# Patient Record
Sex: Female | Born: 1955 | Race: White | Hispanic: No | Marital: Married | Smoking: Current some day smoker
Health system: Southern US, Community
[De-identification: ages and names within clinical notes are randomized; demographics above are authoritative.]

## PROBLEM LIST (undated history)

## (undated) DIAGNOSIS — T50902A Poisoning by unspecified drugs, medicaments and biological substances, intentional self-harm, initial encounter: Secondary | ICD-10-CM

## (undated) DIAGNOSIS — I255 Ischemic cardiomyopathy: Secondary | ICD-10-CM

## (undated) DIAGNOSIS — Z9289 Personal history of other medical treatment: Secondary | ICD-10-CM

## (undated) DIAGNOSIS — L409 Psoriasis, unspecified: Secondary | ICD-10-CM

## (undated) DIAGNOSIS — K219 Gastro-esophageal reflux disease without esophagitis: Secondary | ICD-10-CM

## (undated) DIAGNOSIS — E039 Hypothyroidism, unspecified: Secondary | ICD-10-CM

## (undated) DIAGNOSIS — I10 Essential (primary) hypertension: Secondary | ICD-10-CM

## (undated) DIAGNOSIS — I4819 Other persistent atrial fibrillation: Secondary | ICD-10-CM

## (undated) DIAGNOSIS — D649 Anemia, unspecified: Secondary | ICD-10-CM

## (undated) DIAGNOSIS — Z66 Do not resuscitate: Secondary | ICD-10-CM

## (undated) DIAGNOSIS — F32A Depression, unspecified: Secondary | ICD-10-CM

## (undated) DIAGNOSIS — F1010 Alcohol abuse, uncomplicated: Secondary | ICD-10-CM

## (undated) DIAGNOSIS — K859 Acute pancreatitis without necrosis or infection, unspecified: Secondary | ICD-10-CM

## (undated) DIAGNOSIS — I2510 Atherosclerotic heart disease of native coronary artery without angina pectoris: Secondary | ICD-10-CM

## (undated) DIAGNOSIS — I509 Heart failure, unspecified: Secondary | ICD-10-CM

## (undated) DIAGNOSIS — I639 Cerebral infarction, unspecified: Secondary | ICD-10-CM

## (undated) DIAGNOSIS — F329 Major depressive disorder, single episode, unspecified: Secondary | ICD-10-CM

## (undated) DIAGNOSIS — E785 Hyperlipidemia, unspecified: Secondary | ICD-10-CM

## (undated) DIAGNOSIS — K5790 Diverticulosis of intestine, part unspecified, without perforation or abscess without bleeding: Secondary | ICD-10-CM

## (undated) DIAGNOSIS — N6310 Unspecified lump in the right breast, unspecified quadrant: Principal | ICD-10-CM

## (undated) DIAGNOSIS — F419 Anxiety disorder, unspecified: Secondary | ICD-10-CM

## (undated) HISTORY — DX: Unspecified lump in the right breast, unspecified quadrant: N63.10

## (undated) HISTORY — DX: Poisoning by unspecified drugs, medicaments and biological substances, intentional self-harm, initial encounter: T50.902A

## (undated) HISTORY — DX: Do not resuscitate: Z66

## (undated) HISTORY — DX: Psoriasis, unspecified: L40.9

## (undated) HISTORY — PX: COLOSTOMY REVERSAL: SHX5782

## (undated) HISTORY — DX: Other persistent atrial fibrillation: I48.19

## (undated) HISTORY — PX: ILEOSTOMY: SHX1783

## (undated) HISTORY — PX: ILEOSTOMY CLOSURE: SHX1784

---
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
## 1979-01-22 DIAGNOSIS — F1010 Alcohol abuse, uncomplicated: Secondary | ICD-10-CM

## 1979-01-22 HISTORY — DX: Alcohol abuse, uncomplicated: F10.10

## 2002-03-11 ENCOUNTER — Encounter: Payer: Self-pay | Admitting: Internal Medicine

## 2002-03-11 ENCOUNTER — Encounter: Admission: RE | Admit: 2002-03-11 | Discharge: 2002-03-11 | Payer: Self-pay | Admitting: Internal Medicine

## 2002-03-31 ENCOUNTER — Encounter: Admission: RE | Admit: 2002-03-31 | Discharge: 2002-03-31 | Payer: Self-pay | Admitting: Internal Medicine

## 2002-03-31 ENCOUNTER — Encounter: Payer: Self-pay | Admitting: Internal Medicine

## 2002-12-30 ENCOUNTER — Encounter (INDEPENDENT_AMBULATORY_CARE_PROVIDER_SITE_OTHER): Payer: Self-pay | Admitting: *Deleted

## 2002-12-30 ENCOUNTER — Ambulatory Visit (HOSPITAL_COMMUNITY): Admission: RE | Admit: 2002-12-30 | Discharge: 2002-12-30 | Payer: Self-pay | Admitting: Gastroenterology

## 2003-08-29 ENCOUNTER — Inpatient Hospital Stay (HOSPITAL_COMMUNITY): Admission: EM | Admit: 2003-08-29 | Discharge: 2003-09-02 | Payer: Self-pay | Admitting: *Deleted

## 2004-05-22 ENCOUNTER — Inpatient Hospital Stay (HOSPITAL_COMMUNITY): Admission: EM | Admit: 2004-05-22 | Discharge: 2004-05-26 | Payer: Self-pay | Admitting: Emergency Medicine

## 2005-05-25 ENCOUNTER — Ambulatory Visit: Payer: Self-pay | Admitting: Internal Medicine

## 2005-05-25 ENCOUNTER — Inpatient Hospital Stay (HOSPITAL_COMMUNITY): Admission: EM | Admit: 2005-05-25 | Discharge: 2005-06-01 | Payer: Self-pay | Admitting: Emergency Medicine

## 2005-05-26 ENCOUNTER — Encounter (INDEPENDENT_AMBULATORY_CARE_PROVIDER_SITE_OTHER): Payer: Self-pay | Admitting: *Deleted

## 2005-06-09 ENCOUNTER — Emergency Department (HOSPITAL_COMMUNITY): Admission: EM | Admit: 2005-06-09 | Discharge: 2005-06-10 | Payer: Self-pay | Admitting: Emergency Medicine

## 2007-03-23 ENCOUNTER — Ambulatory Visit: Payer: Self-pay | Admitting: Internal Medicine

## 2007-03-23 ENCOUNTER — Inpatient Hospital Stay (HOSPITAL_COMMUNITY): Admission: EM | Admit: 2007-03-23 | Discharge: 2007-03-29 | Payer: Self-pay | Admitting: Emergency Medicine

## 2007-03-24 ENCOUNTER — Ambulatory Visit: Payer: Self-pay | Admitting: Vascular Surgery

## 2007-03-24 ENCOUNTER — Encounter: Payer: Self-pay | Admitting: Family Medicine

## 2007-03-27 ENCOUNTER — Encounter (INDEPENDENT_AMBULATORY_CARE_PROVIDER_SITE_OTHER): Payer: Self-pay | Admitting: *Deleted

## 2007-04-03 ENCOUNTER — Ambulatory Visit: Payer: Self-pay | Admitting: *Deleted

## 2007-04-03 ENCOUNTER — Ambulatory Visit: Payer: Self-pay | Admitting: Family Medicine

## 2007-05-06 ENCOUNTER — Ambulatory Visit: Payer: Self-pay | Admitting: Internal Medicine

## 2007-05-06 ENCOUNTER — Encounter: Payer: Self-pay | Admitting: Family Medicine

## 2007-05-06 LAB — CONVERTED CEMR LAB
ALT: 23 units/L (ref 0–35)
AST: 24 units/L (ref 0–37)
Alcohol, Ethyl (B): <5 mg/dL (ref 0–10)
Amphetamine Screen, Ur: NEGATIVE
Barbiturate Quant, Ur: NEGATIVE
Basophils Relative: 1 % (ref 0–1)
Benzodiazepines.: NEGATIVE
Chloride: 103 meq/L (ref 96–112)
Cocaine Metabolites: NEGATIVE
Creatinine,U: 480.3 mg/dL
Eosinophils Absolute: 0.3 10*3/uL (ref 0.0–0.7)
Eosinophils Relative: 5 % (ref 0–5)
Hemoglobin: 13.8 g/dL (ref 12.0–15.0)
Lymphocytes Relative: 25 % (ref 12–46)
MCV: 92.6 fL (ref 78.0–100.0)
Neutro Abs: 4 10*3/uL (ref 1.7–7.7)
Phencyclidine (PCP): NEGATIVE
Propoxyphene: NEGATIVE
RBC: 4.61 M/uL (ref 3.87–5.11)
RDW: 13.5 % (ref 11.5–15.5)
Sodium: 141 meq/L (ref 135–145)
TSH: 3.954 microintl units/mL (ref 0.350–5.50)
Total Protein: 7.9 g/dL (ref 6.0–8.3)
VLDL: 26 mg/dL (ref 0–40)
WBC: 6.6 10*3/uL (ref 4.0–10.5)

## 2007-05-15 ENCOUNTER — Ambulatory Visit: Payer: Self-pay | Admitting: Internal Medicine

## 2007-07-01 ENCOUNTER — Encounter: Admission: RE | Admit: 2007-07-01 | Discharge: 2007-07-01 | Payer: Self-pay | Admitting: Surgery

## 2007-07-31 ENCOUNTER — Inpatient Hospital Stay (HOSPITAL_COMMUNITY): Admission: RE | Admit: 2007-07-31 | Discharge: 2007-08-07 | Payer: Self-pay | Admitting: Surgery

## 2007-07-31 ENCOUNTER — Encounter (INDEPENDENT_AMBULATORY_CARE_PROVIDER_SITE_OTHER): Payer: Self-pay | Admitting: Surgery

## 2007-08-13 ENCOUNTER — Inpatient Hospital Stay (HOSPITAL_COMMUNITY): Admission: AD | Admit: 2007-08-13 | Discharge: 2007-08-14 | Payer: Self-pay | Admitting: Surgery

## 2007-09-24 ENCOUNTER — Ambulatory Visit (HOSPITAL_COMMUNITY): Admission: RE | Admit: 2007-09-24 | Discharge: 2007-09-24 | Payer: Self-pay | Admitting: Surgery

## 2007-11-12 ENCOUNTER — Encounter (INDEPENDENT_AMBULATORY_CARE_PROVIDER_SITE_OTHER): Payer: Self-pay | Admitting: Surgery

## 2007-11-12 ENCOUNTER — Inpatient Hospital Stay (HOSPITAL_COMMUNITY): Admission: RE | Admit: 2007-11-12 | Discharge: 2007-11-17 | Payer: Self-pay | Admitting: Surgery

## 2008-10-02 ENCOUNTER — Emergency Department (HOSPITAL_COMMUNITY): Admission: EM | Admit: 2008-10-02 | Discharge: 2008-10-03 | Payer: Self-pay | Admitting: Emergency Medicine

## 2008-10-21 ENCOUNTER — Ambulatory Visit: Payer: Self-pay | Admitting: Internal Medicine

## 2008-10-21 ENCOUNTER — Encounter (INDEPENDENT_AMBULATORY_CARE_PROVIDER_SITE_OTHER): Payer: Self-pay | Admitting: Family Medicine

## 2008-10-21 LAB — CONVERTED CEMR LAB
TSH: 3.742 microintl units/mL (ref 0.350–4.500)
Triglycerides: 104 mg/dL (ref <150)

## 2008-10-31 ENCOUNTER — Ambulatory Visit: Payer: Self-pay | Admitting: Internal Medicine

## 2009-01-21 DIAGNOSIS — I639 Cerebral infarction, unspecified: Secondary | ICD-10-CM

## 2009-01-21 HISTORY — DX: Cerebral infarction, unspecified: I63.9

## 2010-02-12 ENCOUNTER — Encounter: Payer: Self-pay | Admitting: Surgery

## 2010-04-27 LAB — URINALYSIS, ROUTINE W REFLEX MICROSCOPIC
Bilirubin Urine: NEGATIVE
Glucose, UA: NEGATIVE mg/dL
Ketones, ur: NEGATIVE mg/dL
pH: 5 (ref 5.0–8.0)

## 2010-04-27 LAB — DIFFERENTIAL
Eosinophils Relative: 3 % (ref 0–5)
Lymphs Abs: 2.8 10*3/uL (ref 0.7–4.0)
Neutro Abs: 3.6 10*3/uL (ref 1.7–7.7)
Neutrophils Relative %: 51 % (ref 43–77)

## 2010-04-27 LAB — CBC
Hemoglobin: 14.5 g/dL (ref 12.0–15.0)
MCHC: 34.3 g/dL (ref 30.0–36.0)
MCV: 93 fL (ref 78.0–100.0)
Platelets: 317 10*3/uL (ref 150–400)
RBC: 4.56 MIL/uL (ref 3.87–5.11)
WBC: 7.1 10*3/uL (ref 4.0–10.5)

## 2010-04-27 LAB — URINE CULTURE: Colony Count: 6000

## 2010-04-27 LAB — COMPREHENSIVE METABOLIC PANEL
AST: 28 U/L (ref 0–37)
CO2: 22 mEq/L (ref 19–32)
Calcium: 8.9 mg/dL (ref 8.4–10.5)
Chloride: 102 mEq/L (ref 96–112)
GFR calc non Af Amer: >60 mL/min (ref >60)
Glucose, Bld: 84 mg/dL (ref 70–99)
Total Bilirubin: 0.3 mg/dL (ref 0.3–1.2)
Total Protein: 8.2 g/dL (ref 6.0–8.3)

## 2010-04-27 LAB — TROPONIN I: Troponin I: 0.03 ng/mL (ref 0.00–0.06)

## 2010-04-27 LAB — URINE MICROSCOPIC-ADD ON

## 2010-04-27 LAB — GLUCOSE, CAPILLARY

## 2010-04-27 LAB — RAPID URINE DRUG SCREEN, HOSP PERFORMED: Tetrahydrocannabinol: NOT DETECTED

## 2010-04-27 LAB — ETHANOL: Alcohol, Ethyl (B): 305 mg/dL — ABNORMAL HIGH (ref 0–10)

## 2010-04-27 LAB — TRICYCLICS SCREEN, URINE: TCA Scrn: NOT DETECTED

## 2010-04-27 LAB — PROTIME-INR
INR: 0.9 (ref 0.00–1.49)
Prothrombin Time: 12.2 seconds (ref 11.6–15.2)

## 2010-04-27 LAB — CK TOTAL AND CKMB (NOT AT ARMC): CK, MB: 4.1 ng/mL — ABNORMAL HIGH (ref 0.3–4.0)

## 2010-04-27 LAB — POCT CARDIAC MARKERS: Troponin i, poc: <0.05 ng/mL (ref 0.00–0.09)

## 2010-06-05 NOTE — Discharge Summary (Signed)
[name], [name] NO.:  [id number]   MEDICAL RECORD NO.:  [id number]          PATIENT TYPE:  INP   LOCATION:  5118                         FACILITY:  [facility]   PHYSICIAN:  Zenaida Deed. Mayford Knife, M.D.DATE OF BIRTH:  09-Jun-1955   DATE OF ADMISSION:  03/23/2007  DATE OF DISCHARGE:  03/29/2007                               DISCHARGE SUMMARY   PRIMARY CARE PHYSICIAN:  The patient receives her primary care at [facility].   CONSULTATIONS:  Estanislado Pandy, M.D. of neurology.   REASON FOR ADMISSION:  The patient was admitted with a history of  nausea, vomiting, and abdominal pain x1 days.   DISCHARGE DIAGNOSES:  1. Acute pancreatitis, likely etiology is alcohol.  2. Right middle cerebral artery cerebrovascular accident.  3. Alcohol abuse.  4. History of perforated sigmoid diverticulitis with colectomy in May      of 2007.  5. Multiple episodes of pancreatitis.  6. Psoriasis.  7. Hyperlipidemia.   DISCHARGE MEDICATIONS:  1. Triamcinolone acetamide 0.1% ointment to affected areas b.i.d.  2. Aspirin 81 mg p.o. daily.  3. Simvastatin 40 mg p.o. daily.  4. Pantoprazole 40 mg p.o. daily.  5. Morphine sulfate IR 15 mg one tablet p.o. q.6 hours p.r.n. pain      dispense #4.   DISCHARGE LABORATORY DATA:  Sodium 139, potassium 4.0, BUN 4, creatinine  0.79, hemoglobin 14.1, platelets 347, hemoglobin A1C 5.7, triglycerides  64, HDL 59, LDL 184.   HOSPITAL COURSE:  Problem 1.  Pancreatitis.  The patient presented with  one-day history of nausea, vomiting, and abdominal pain.  The patient  had multiple episodes of pancreatitis in the past.  Upon evaluation in  the emergency department, she was found to have decreased bowel sounds,  right upper quadrant tenderness, and on laboratory evaluation had a  lipase of 624.  The patient had a CT scan of the abdomen and pelvis  which showed acute pancreatitis, uncinate process, and proximal body.  The patient was made NPO and  started on morphine for pain control.  The  patient had a right upper quadrant ultrasound done which did not show  any evidence of gallstones and therefore the likely cause of her  pancreatitis is once again alcohol as it has been in the past.  Throughout the course of her hospital stay, the patient's diet was  advanced and she was able to tolerate it without difficulty.  She was  provided with immediate release morphine tablets on discharge for any  residual pain.  The patient was also counseled to stop drinking alcohol  as this is likely the cause of her multiple episodes of pancreatitis.   Problem 2.  Right middle cerebral artery cerebrovascular accident.  Upon  presentation in questioning the patient's evaluation in the emergency  department, she reported an episode in February of 2009 when she recalls  acute onset of left facial droop and left-sided weakness.  The patient  did not seek medical care for this issue.  These symptoms and physical  examination findings were noted upon assessment.  She had a CT scan  of  the head performed which showed possible subacute stroke in the right  frontal parietal region.  The patient subsequently also had an MRI and  MRA of the brain done which showed subacute and chronic multifocal right  MCA territory ischemia without associated hemorrhage or mass effect.  It  also showed complete occlusion of the right middle cerebral artery with  otherwise normal cranial or cerebral vasculature.  The patient was  followed by physical therapy, occupational therapy, and speech therapy  while in the hospital.  She is provided with outpatient or home health  speech therapy, home health occupational therapy, and home health  physical therapy for continued rehabilitation.  Following a TTE and TEE  which did not show any cardioembolic source for CVA, neurology was  consulted to see if there was any possible intervention for her occluded  right middle cerebral artery  which there was none and they recommended  continuing aspirin daily as well as controlling the patient's risk  factors.  The patient was counseled to stop smoking as it is a very  large risk factor for CVA.  She will need to have these issues followed  up as an outpatient.   Problem 3.  History of perforated diverticulitis with persistent  colostomy.  While the patient was hospitalized, [facility]  was contacted and it was determined that the patient has an outstanding  balance with them and that is why she is unable to have an ostomy  takedown at this time.  This information was relayed to the patient.   Problem 4.  Hyperlipidemia.  Given the patient's CVA and other risk  factors, she was started on Simvastatin 40 mg p.o. daily.   CONDITION ON DISCHARGE:  Stable.   DISPOSITION:  The patient is discharged to home with home health  physical therapy, home health occupational therapy, and home health  speech therapy.   FOLLOWUP:  The patient is to follow up at [facility] on April 03, 2007.   FOLLOW-UP ISSUES:  The patient should undergo a hypercoagulable workup  given her relatively young age and no specific etiology of her stroke  being determined.   The patient was recently started on statin and will need her  transaminases monitored.      Lauro Franklin, MD  Electronically Signed      Zenaida Deed. Mayford Knife, M.D.  Electronically Signed    TCB/MEDQ  D:  04/01/2007  T:  04/02/2007  Job:  [id number]

## 2010-06-05 NOTE — H&P (Signed)
NAMEVERDELL, KINCANNON NO.:  [id number]   MEDICAL RECORD NO.:  [id number]          PATIENT TYPE:  INP   LOCATION:  1536                         [facility]ACILITY:  [facility]   PHYSICIAN:  Adolph Pollack, M.D.DATE OF BIRTH:  [birth year]   DATE OF ADMISSION:  08/13/2007  DATE OF DISCHARGE:                              HISTORY & PHYSICAL   REASON FOR ADMISSION:  Dehydration and failure to thrive.   HISTORY OF PRESENT ILLNESS:  This 55 year old female is postoperative  day 13 from a colostomy closure and diverting loop ileostomy who states  that she has been having increasing problems with some crampy upper  abdominal pain and nausea, vomiting and weakness.  She is having  difficulty holding anything down.  She has ileostomy output as well.  She says she is having decreasing urinary output.  She has no fever or  chills.   PAST MEDICAL HISTORY:  1. Recurring episodes of pancreatitis felt to be secondary to alcohol      abuse.  2. Cerebrovascular accident.  3. Sigmoid diverticulitis with perforation.  4. Psoriasis.  5. Hyperlipidemia.  6. Alcohol abuse.   PREVIOUS ABDOMINAL SURGERIES:  1. Sigmoid colectomy with colostomy in May 2007.  2. Colostomy closure with loop ileostomy as above.   ALLERGIES:  She has no known drug allergies.   MEDICATIONS:  Crestor, hydrochlorothiazide, Protonix and aspirin.   SOCIAL HISTORY:  She lives with her husband, smokes a pack of cigarettes  a day.  Has been known to drink beer and vodka 2-3 days a week but has  tried to quit.   PHYSICAL EXAMINATION:  GENERAL APPEARANCE:  A well-developed, well-  nourished female, a little shaky at times, but she is in no acute  distress.  VITAL SIGNS:  Temperature is 98.2, blood pressure is 136/80, pulse 83,  O2 saturations 98% on room air.  EYES:  Extraocular motion intact.  I see no icterus.  RESPIRATORY:  Breath sounds equal and clear, respirations unlabored.  CARDIOVASCULAR:  Regular rate,  regular rhythm.  I hear no murmur.  ABDOMEN:  Soft, nondistended, nontender with active bowel sounds.  Incisions are clean, dry and intact.  There is a loop ileostomy with a  bar in the right lower quadrant that is pink with some fluid output.  MUSCULOSKELETAL:  No calf swelling or tenderness.   LABORATORY DATA:  From August 11, 2007, demonstrates a white cell count of  16,000, hemoglobin 14.7 (up from 12.6 five days ago).  Electrolytes  notable for BUN of 33, creatinine of 1.4 which is up from her previous  admission as well.   IMPRESSION:  1. Nausea and vomiting.  2. Significant dehydration.  3. Fairly to thrive.  The etiology of nausea and vomiting is unclear      to me as she has good bowel sounds.  No distention.  She is having      good ileostomy output.  She does not relate it to taking any      medicine.   PLAN:  Will admit and hydrate aggressively.  We will check labs  tomorrow.  If she continues to have elevation of white count and feeling  poorly, she may need an abdominal CT.      Adolph Pollack, M.D.  Electronically Signed     TJR/MEDQ  D:  08/13/2007  T:  08/13/2007  Job:  [id number]

## 2010-06-05 NOTE — Consult Note (Signed)
[name], [name] NO.:  [id number]   MEDICAL RECORD NO.:  [id number]          PATIENT TYPE:  INP   LOCATION:  5118                         FACILITY:  [facility]   PHYSICIAN:  Bevelyn Buckles. Champey, M.D.DATE OF BIRTH:  07/14/1955   DATE OF CONSULTATION:  03/27/2007  DATE OF DISCHARGE:                                 CONSULTATION   REFERRING PHYSICIAN:  Santiago Bumpers. Leveda Anna, M.D.   REASON FOR CONSULTATION:  Stroke.   HISTORY OF PRESENT ILLNESS:  [name] is a 55 year old Caucasian  female with multiple medical problems initially presented on March 23, 2007, with nausea, vomiting, abdominal pain diagnosed pancreatitis.  Patient apparently had stroke within the last 1 to 2 months starting  with sudden onset of  left facial drop and left sided weakness and  numbness.  She did not receive medical treatment at this time and  symptoms still persist.  We were consulted to evaluate stroke.  Patient  states that her symptoms started suddenly and have persisted since.  She  also complained of right sided headache with the onset and slurred  speech, which is still present.  She denies any vision changes,  dizziness, vertigo, swallowing problems, chewing problems or loss of  consciousness.   PAST MEDICAL HISTORY:  1. Positive for diverticulitis.  2. Pancreatitis.  3. Psoriasis.  4. Stroke.   CURRENT MEDICATIONS:  Multiple vitamin, nicotine, aspirin, Zocor,  morphine.   ALLERGIES:  1. PENICILLIN.  2. CODEINE.  3. LATEX.   SOCIAL HISTORY:  Patient lives with her husband, is unemployed.  Smokes  1 pack of cigarettes per day.  Drinks alcohol, beer and vodka a few days  per week.   FAMILY HISTORY:  Positive for colon cancer, lung cancer.   REVIEW OF SYSTEMS:  Positive as per HPI.  Review of systems negative as  per HPI in greater than 8 other organ systems.   PHYSICAL EXAMINATION:  VITALS:  Temperature is 98.4.  Pulse is 100.  Respirations 20.  Blood pressure is 112/74.   O2 sat is 91% to 94%.  HEENT:  Normocephalic, atraumatic.  Extraocular muscles are intact.  Visual fields are intact.  Facial droop is present.  NECK:  Supple.  HEART:  Regular.  LUNGS:  Clear.  ABDOMEN:  Good bowel sounds.  EXTREMITIES:  Show good pulses.  NEUROLOGIC:  Patient is awake, alert, dysarthric speech, following  commands appropriately.  Cranial nerves:  Patient has left facial droop.  Extraocular muscles are intact.  Patient has decreased sensation in the  left face.  Motor examination:  Patient has 3-4/5 strength in the left  and 5/5 strength on the right.  Sensory examination:  Patient has  decreased sensation to light touch on the left.  Reflex 1+ throughout,  toes are neutral bilaterally.  Gait is not assessed secondary to safety.   Carotid Doppler showed no significant stenosis in bilateral ICAs.  MRI/MRA of the brain showed multifocal subacute chronic right MCA  infarct/ischemia with a complete occlusion of the right MCA proximally.  A 2D electrocardiogram showed an EF of 55%.  No  source of emboli.  TEE  preliminary is essentially normal of no embolic source noted.   LABORATORIES:  WBCs 11.8, hemoglobin 12. 4, hematocrit is 36.2,  platelets 251, sodium is 134, potassium is 3.6, chloride is 100, CO2 is  27, BUN 4, creatinine 0.78, glucose 87, calcium is 8.7.  Homocystine  level is 9.6, hemoglobin A1C is 5.7, total cholesterol is 256, LDL is  184.   IMPRESSION:  This 55 year old Caucasian female with pancreatitis with  right mesenteric infarct with right middle cerebral artery proximal  occlusion that occurred 6 weeks ago.  Patient is not a candidate for IV  tPA or intervention as symptoms are greater than 3 hours out.  Work up  is essentially completed.  Transesophageal echocardiogram (TEE)  is  essentially normal with no source of emboli.  I agree with the aspirin  per day, need to definitely control patient's risk factors.  I agree  with the Zocor for elevated  cholesterol.  Discussed with patient about  to stop smoking and drinking, get PT, OT and speech consults.  We will  follow the patient as consultants.      Bevelyn Buckles. Nash Shearer, M.D.  Electronically Signed     DRC/MEDQ  D:  03/27/2007  T:  03/27/2007  Job:  [id number]

## 2010-06-05 NOTE — Op Note (Signed)
[name], [name]                ACCOUNT NO.:  [id number]   MEDICAL RECORD NO.:  [id number]          PATIENT TYPE:  INP   LOCATION:  0001                         FACILITY:  [facility]   PHYSICIAN:  Wilmon Arms. Corliss Skains, M.D. DATE OF BIRTH:  13-Nov-1955   DATE OF PROCEDURE:  07/31/2007  DATE OF DISCHARGE:                               OPERATIVE REPORT   PREOPERATIVE DIAGNOSIS:  Descending colostomy, status post Hartmann's  resection for perforated diverticulitis.   POSTOPERATIVE DIAGNOSIS:  Descending colostomy, status post Hartmann's  resection for perforated diverticulitis.   PROCEDURE PERFORMED:  Exploratory laparotomy with lysis of adhesions,  closure of colostomy, with diverting loop ileostomy.  Rigid  sigmoidoscopy.   SURGEON:  Wilmon Arms. Corliss Skains, M.D., FACS   ASSISTANT:  [name], M.D.   ANESTHESIA:  General endotracheal.   INDICATIONS:  The patient is a 55 year old female who is 2 years status  post emergent sigmoid colectomy with Hartmann's procedure for perforated  diverticulitis with intra-abdominal abscess.  The patient delayed her  colostomy takedown for financial reasons.  She has been doing reasonably  well from an abdominal standpoint.  She did have an episode of  pancreatitis for which she was hospitalized in March 2009.  This was  felt to be secondary alcohol abuse, but she claims that she has stopped  drinking.  She continues to smoke.  She presents now for colostomy  closure.  She underwent a bowel prep yesterday.   DESCRIPTION OF PROCEDURE:  The patient brought to the operating room and  placed in a supine position on the operating room table.  After an  adequate level of general anesthesia was obtained, a Foley catheter was  placed under sterile technique.  The patient's legs were placed in the  lithotomy position.  I performed a digital rectal examination and  removed several hard balls of mucus.  Her abdomen and perineum was then  prepped with Betadine and  draped in a sterile fashion.  Time-out was  taken to ensure the proper patient and proper procedure.  We opened her  old midline incision.  Dissection was carried down to the fascia.  The  fascia was opened vertically.  We bluntly entered the peritoneal cavity.  A small serosal tear was seen on a the segment of small bowel, and this  was repaired with two interrupted 3-0 silk sutures.  Metzenbaum scissors  were used to lyse the significant number adhesions to the anterior  abdominal wall.  We were able to mobilize all the small bowel away from  the anterior abdominal wall.  We worked our way out towards the left  lower quadrant and towards the colostomy site.  Prior to making an  incision, we had close the old colostomy with a pursestring suture.  Once we had adequately mobilized the colostomy inside the abdomen, we  made an elliptical incision around the colostomy site on the skin.  Dissection was carried down to the fascia with cautery.  We took the old  colostomy down from the fascia and brought it into the peritoneal  cavity.  This was in the mid-descending  colon.  We mobilized the  transverse colon and the splenic flexure down until it seemed like we  had adequate length.  We inserted a Balfour retractor and retracted the  small bowel in the upper abdomen.  We then spent some time dissecting  the rectal stump free from the surrounding tissue.  Once it appeared  that we had adequate length of the rectal stump as well as the  descending colon, we amputated the distal 4 cm of the descending  colostomy.  This was sent for pathologic examination.  We opened the end  of the colon and checked it with EEA sizers.  We were able to easily  pass a 29 mm sizer.  We brought a 29 mm EEA stapler on the field and  inserted the anvil in the proximal stump.  This was secured with a 2-0  Prolene pursestring suture.  I then went below to try to pass the EEA  stapler.  First, I performed a rigid  proctoscopy.  I had to remove a lot  of hardened mucus from the rectum.  The rectum seemed rather thick.  Dr.  Lurene Shadow, my assistant, was looking down in the pelvis, and we were able  to pass the sigmoidoscope almost to the end of the rectal stump, except  for the most proximal 3 cm.  It appeared to be very tightly kinked at  this area.  We then spent some more time mobilizing the rectal stump.  This seemed to allow better passage of the proctoscope.  We spent a  significant amount of time trying to pass this scope as well as the  stapler.  We were finally able to advance the 29 mm stapler through the  rectum and brought it out through the anterior wall of the rectal stump.  The stapler was mated with an anvil, and we created a stapled circular  anastomosis.  We then inserted the sigmoidoscope and again insufflated.  The pelvis was filled with saline.  No air bubbles were noted.  There  did seem to be some bleeding in the rectum from repeated passage of the  stapler.  We checked the stapler for the tissue doughnuts.  The distal  doughnut was intact, but the proximal donut was not complete.  Therefore, the decision was made to protect the anastomosis with a loop  ileostomy.  We brought up a loop of the terminal ileum through a  circular incision in the right lower quadrant.  We then irrigated the  abdomen and pelvis thoroughly.  The fascia of the left lower quadrant  colostomy site was closed with a single-stranded #1 PDS.  Double-  stranded PDS was used to close the fascia.  Staples were used to close  the skin.  We then matured the ileostomy with 3-0 Vicryl sutures.  I  placed the short segment of a 16-French red rubber catheter underneath  the loop to act as a bar.  This was secured with 3-0 nylon suture.  An  ileostomy appliance was cut to fit.  A clean dressing was placed on the  two staple lines.  The patient was then extubated and brought to the  recovery room in stable condition.   All sponge, instrument, and needle  counts were correct.      Wilmon Arms. Tsuei, M.D.  Electronically Signed     MKT/MEDQ  D:  07/31/2007  T:  07/31/2007  Job:  [id number]

## 2010-06-05 NOTE — Discharge Summary (Signed)
[name], [name]                ACCOUNT NO.:  [id number]   MEDICAL RECORD NO.:  [id number]          PATIENT TYPE:  INP   LOCATION:  1524                         FACILITY:  [facility]   PHYSICIAN:  Wilmon Arms. Corliss Skains, M.D. DATE OF BIRTH:  1955-11-05   DATE OF ADMISSION:  07/31/2007  DATE OF DISCHARGE:  08/07/2007                               DISCHARGE SUMMARY   ADMISSION DIAGNOSIS:  Diverting colostomy status post perforated sigmoid  diverticulitis.   DISCHARGE DIAGNOSIS:  Diverting colostomy status post perforated sigmoid  diverticulitis.   PROCEDURE PERFORMED:  Exploratory laparotomy, lysis of adhesions,  closure of colostomy with a diverting loop ileostomy.   BRIEF HISTORY:  The patient is a 55 year old female who initially was  seen almost 2 years ago with a perforated sigmoid diverticulitis.  She  underwent a Hartmann's resection with an end descending colostomy.  The  patient was then lost to followup and did not present until this year  for her colostomy reversal.  She was studied preoperatively, and there  was no leak or fistula to the rectal stump.  The remainder of her colon  appeared normal.   HOSPITAL COURSE:  The patient's was admitted to the hospital on July 31, 2007.  She underwent exploratory laparotomy and significant lysis of  adhesions.  We mobilized her colostomy and performed an end-to-end  stapled anastomosis to the rectal stump; however, this was a very  difficult procedure due to the thickening of the defunctionalized distal  stump.  Therefore, to protect this anastomosis, we brought out a loop  ileostomy.  Postoperatively, the patient progressed slowly.  Her wounds  remained clean with no sign of infection.  She began having ileostomy  output on postop day #3.  She had some emesis on postoperative days #5  and #6 with some coffee-ground appearance.  Her hemoglobin remained  stable.  Her nausea and vomiting were treated with Zofran and Reglan.  She slowly  progressed and was discharged home on August 07, 2007.  She was  given Vicodin p.r.n. for pain.  She resumed all of her home medications,  including Prilosec and Reglan.  She is to follow up with Dr. Corliss Skains in 1  week for staple removal.  She has been instructed on her ileostomy care.      Wilmon Arms. Tsuei, M.D.  Electronically Signed     MKT/MEDQ  D:  09/04/2007  T:  09/04/2007  Job:  [id number]

## 2010-06-05 NOTE — Discharge Summary (Signed)
[name], [name]                ACCOUNT NO.:  [id number]   MEDICAL RECORD NO.:  [id number]          PATIENT TYPE:  INP   LOCATION:  1535                         FACILITY:  [facility]   PHYSICIAN:  Wilmon Arms. Corliss Skains, M.D. DATE OF BIRTH:  06-Jun-1955   DATE OF ADMISSION:  11/12/2007  DATE OF DISCHARGE:  11/17/2007                               DISCHARGE SUMMARY   ADMISSION DIAGNOSIS:  Loop ileostomy after sigmoid diverticulitis.   DISCHARGE DIAGNOSIS:  Loop ileostomy after sigmoid diverticulitis.   PROCEDURE:  Loop ileostomy closure.   BRIEF HISTORY:  The patient is a 55 year old female who is 2 years  status post perforated sigmoid diverticulitis.  She had a Hartmann's  resection; had a descending colostomy.  She was lost to follow-up, and  several months ago had a closure of her descending colostomy.  However,  the anastomosis was quite difficult and was therefore protected with a  loop ileostomy.  Her rectal anastomosis has since been studied and is  intact.  She presents for ileostomy closure.   HOSPITAL COURSE:  The patient was admitted on November 12, 2007.  Underwent closure of her loop ileostomy through a right lower quadrant  incision.  Postoperatively she had some pain control issues, but has,  otherwise, done well.  She regained bowel function on postop day number  #4 with a bowel movement.  Her wound looks good.  She did have some left-  sided weakness.  A head CT was obtained which showed evolution of her  previous right-sided MCA infarct, but, otherwise, no acute changes.  These symptoms have resolved on the day of discharge.   DISCHARGE INSTRUCTIONS:  The patient resumed all of her home  medications.  She is given Percocet p.r.n. for pain.  She may shower  over the staples.  Follow up in 1 week for staple removal.  No heavy  lifting.      Wilmon Arms. Tsuei, M.D.  Electronically Signed     MKT/MEDQ  D:  11/17/2007  T:  11/17/2007  Job:  [id number]

## 2010-06-05 NOTE — Op Note (Signed)
NAMEMONTI, VILLERS                ACCOUNT NO.:  [id number]   MEDICAL RECORD NO.:  [id number]          PATIENT TYPE:  INP   LOCATION:  1535                         FACILITY:  [facility]   PHYSICIAN:  Wilmon Arms. Corliss Skains, M.D. 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   POSTOPERATIVE DIAGNOSIS:  Loop ileostomy status post colon resection  with anastomosis.   PROCEDURE PERFORMED:  Loop ileostomy closure.   SURGEON:  Wilmon Arms. Corliss Skains, M.D., FACS   ASSISTANT:  Velora Heckler, MD.   ANESTHESIA:  General endotracheal.   INDICATIONS:  The patient is a 55 year old female who is status post a  Hartmann's resection for perforated diverticulitis.  She underwent a cup  colostomy closure recently, but the rectal anastomosis was quite  difficult.  Therefore we protected it with a loop ileostomy.  She has  been studied and her rectal anastomosis is now well-healed.  She  presents now for ileostomy reversal.   DESCRIPTION OF PROCEDURE:  The patient was brought to the operating room  and placed in the supine position on the operating room table.  After an  adequate level of general anesthesia was obtained, her abdomen was  prepped with Betadine and draped in a sterile fashion.  I had placed a  pursestring suture around the loop ileostomy to occlude it.  A time-out  was taken to assure the proper patient and proper procedure.  We made an  elliptical incision around the ileostomy.  Dissection was carried down  to the subcutaneous tissues with cautery.  We dissected completely  around the ileostomy all the way down to the fascia.  We bluntly  dissected around the fascial opening into the peritoneal cavity.  The  patient had very loose, flimsy adhesions.  We mobilized the ileum.  Once  the loop was adequately mobilized we created a  side-to-side stapled  anastomosis with a GIA 75 stapler.  We transected the old ileostomy site  and closed the enterotomy with a TA-60 stapler.  A reinforcing suture of  3-0 silk was placed at the crotch of the anastomosis.  The anastomosis  was dropped back into the peritoneal cavity.  The fascia was  reapproximated with #1 PDS.  The subcutaneous tissues were thoroughly  irrigated and staples were used to close the skin.  The patient was then  extubated and brought to recovery in stable condition.  All sponge,  instrument and needle counts were correct.      Wilmon Arms. Tsuei, M.D.  Electronically Signed     MKT/MEDQ  D:  11/12/2007  T:  11/13/2007  Job:  [id number]

## 2010-06-05 NOTE — H&P (Signed)
[name], [name] NO.:  [id number]   MEDICAL RECORD NO.:  [id number]          PATIENT TYPE:  INP   LOCATION:  5118                         FACILITY:  [facility]   PHYSICIAN:  Santiago Bumpers. Hensel, M.D.DATE OF BIRTH:  Aug 02, 1955   DATE OF ADMISSION:  03/23/2007  DATE OF DISCHARGE:                              HISTORY & PHYSICAL   CHIEF COMPLAINT:  Nausea and vomiting.   PRIMARY CARE PHYSICIAN:  Unassigned.   HISTORY OF PRESENT ILLNESS:  This is a 55 year old female with a past  medical history significant for multiple episodes of pancreatitis,  presenting with a one day history of nausea, vomiting, and abdominal  pain.  She had no pain or nausea and vomiting on the day prior to  admission but woke up with abdominal pain in the right upper quadrant  and epigastric region on the day of admission.  She had several episodes  of nonbloody emesis without diarrhea throughout the day.  Patient was  drinking heavily on Saturday and Friday nights, days #2 and 3 prior to  admission, less than a six pack of beer each day but also with vodka.  The pain is worse with vomiting and is relieved by Dilaudid.  The pain  radiates to the back.  The patient is unable to eat on the day of  admission and is found to have a lipase of 624 in the emergency  department, and the CT of her abdomen shows acute pancreatitis involving  the head, uncinate process, and proximal body of the pancreas.   PAST MEDICAL HISTORY:  1. Perforated sigmoid diverticulitis with colectomy in May, 2007.      Patient has a persistent ostotomy from this procedure.  2. Pancreatitis x5, at least.  Last admission in 2006.  3. Psoriasis.  4. Apparent cardiovascular accident in February, 2009.  The patient      received no medical care for this but describes abrupt onset of      left facial droop and left-sided weakness, which persists to this      day.   FAMILY HISTORY:  Mother died from colon cancer and alcohol  abuse.  Father died from lung cancer.  Sister with cervical cancer.   SOCIAL HISTORY:  Lives with her husband.  Is unemployed.  Smokes one  pack per day of cigarettes.  Denies illegal drug use.  Drinks beer and  vodka 2-3 days per week.   ALLERGIES:  No known drug allergies.   MEDICATIONS:  None.   REVIEW OF SYSTEMS:  Daily headache x2 weeks with chest pain with  walking, which is associated with shortness of breath.   PHYSICAL EXAMINATION:  Temperature 97.3, heart rate 94, respirations 16,  blood pressure 113/83, saturations 94% on room air.  GENERAL:  Somnolent, mild distress.  CARDIAC:  Regular rate and rhythm with no murmurs, rubs or gallops.  PULMONARY:  Clear to auscultation bilaterally with normal work of  breathing  ABDOMEN:  Decreased bowel sounds.  Right upper quadrant is tender with  the liver 2 cm below the costal margin.  The ostomy bag is  in place and  clean.  EXTREMITIES:  Nontender without edema.  NEUROLOGIC:  Left facial weakness.  Extraocular muscles are intact.  Pupils constricted but with sluggish reaction.  Right upper and lower  extremity strength, 4/5 left upper and lower extremity strength.  DTRs  2+ bilateral.  Babinski's is upgoing bilaterally.   LABS:  Urinalysis shows small hemoglobin and large leukocyte esterase  with many epi's and bacteria.  CBC:  White count of 11.9, hemoglobin  14.6, hematocrit 42.9, platelets 321.  CMET:  Sodium 136, potassium 3.4,  chloride 103, bicarb 26, BUN 10, creatinine 0.69, glucose 146, calcium  9.2, total protein 7.4, albumin 4, AST 19, ALT 17.  Lipase 624.   CT of the abdomen and pelvis shows acute pancreatitis of the head,  uncinate process, and proximal body.  Asymmetric thickening of the  medial duodenal wall, possible cholelithiasis, although without acute  cholecystitis.  Patient has a decompressed  colon with fatty liver  infiltrate and a normal colostomy.   ASSESSMENT/PLAN:  This is a 55 year old female with  pancreatitis.  1. Pancreatitis:  Most likely secondary to alcohol binge.  Will check      fasting lipid panel for triglycerides and keep n.p.o.  Treat with      IV fluids and morphine for pain as well as Zofran and Protonix for      pain.  2. Alcohol abuse:  Check alcohol level and urine drug screen.  Ativan      protocol starting on day #2.  Thiamine, multivitamin.  Check INR      and liver functions in the morning.  3. Urinalysis, which was abnormal.  The patient is asymptomatic for a      UTI at this time.  Will culture the urine, given the large      leukocyte esterase.  4. Psoriasis:  Triamcinolone cream to affected areas.  5. Weakness:  CT of the head to evaluate stroke.  6. Disposition:  Pending issue #1.      Romero Belling, MD  Electronically Signed      Santiago Bumpers. Leveda Anna, M.D.  Electronically Signed    MO/MEDQ  D:  03/24/2007  T:  03/24/2007  Job:  [id number]

## 2010-06-08 NOTE — Op Note (Signed)
[name], [name] NO.:  [id number]   MEDICAL RECORD NO.:  [id number]          PATIENT TYPE:  INP   LOCATION:  6734                         FACILITY:  [facility]   PHYSICIAN:  Wilmon Arms. Corliss Skains, M.D. DATE OF BIRTH:  December 22, 1955   DATE OF PROCEDURE:  05/26/2005  DATE OF DISCHARGE:                                 OPERATIVE REPORT   PREOP DIAGNOSIS:  Perforated sigmoid diverticulitis.   POSTOPERATIVE DIAGNOSIS:  Perforated sigmoid diverticulitis.   PROCEDURE PERFORMED:  Exploratory laparotomy with sigmoid colectomy and  Hartmann's procedure.   SURGEON:  Wilmon Arms. Corliss Skains, M.D.   ASSISTANT:  Dr. Jerelene Redden   ANESTHESIA:  General endotracheal.   INDICATIONS:  The patient is a 56 year old female who has had 3-4 weeks of  slowly worsening lower abdominal pain, intermittent fever as well as bloody  stool and hematemesis.  The patient finally progressed to the point where  she presented to the emergency department for evaluation.  She was noted to  be afebrile with good vital signs.  However, her white count was elevated at  17.3.  Her hemoglobin was 11.4.  She underwent CT scan which showed  descending and sigmoid colon diverticulitis with several small fluid and air  collections which were extraluminal.  These were consistent with a contained  perforation of the sigmoid diverticulitis.  Since the patient with stable  and was afebrile, the decision was made to try to treat her aggressively  with antibiotics.  However, over the next several hours her pain worsened  and she became febrile.  I examined her and felt we should proceed to the  operating room.  I explained to her that we would need to leave her with a  temporary colostomy due to the contamination.   DESCRIPTION OF PROCEDURE:  The patient is brought to the operating room,  placed supine position on the operating table.  After an adequate level of  general endotracheal anesthesia was obtained, a Foley  catheter was placed  under sterile technique.  Due to the patient's stated latex allergy we used  all latex free equipment including Foley catheter.  Her abdomen was then  prepped with Betadine and draped in sterile fashion.  The vertical midline  incision was made from the umbilicus to just above the symphysis pubis.  Dissection was carried down through subcutaneous tissues using Bovie  cautery.  The fascia was opened along the midline.  There were dense omental  adhesions to the left lower quadrant.  These were taken down with cautery.  A large hard inflammatory mass was palpated in the left lower quadrant.  The  terminal ileum also appeared to be densely adherent to this inflammatory  mass.  We began in the left pericolic gutter.  I used finger fracture  technique to dissect the inflammatory mass away from the abdominal wall.  Several pockets of purulent fluid were encountered.  These were cultured.  We continued bluntly mobilizing the sigmoid colon down into the pelvis.  Medially I was able to finger fracture the small bowel away from the  inflammatory mass.  There did not appeared to be any involvement of the  bowel but the mesentery did appear to be inflamed.  There was no abscess in  this area.  The small bowel was then packed up and away in the right upper  quadrant and held with a Balfour retractor extension.  We then mobilized the  splenic flexure with cautery.  We mobilized the descending colon by taking  down the white line of Toldt with cautery.  Once the splenic flexure was  felt to be fairly mobile, we divided the descending colon above the  inflammatory area with a GIA 75 stapler.  We then began taking down the  mesentery of the sigmoid colon between [facility] clamps.  There were also dense  omental adhesions to the colon which required dividing the distal omentum  with Kelly clamps.  These were ligated with 2-0 silk ties.  We continued  mobilizing down into the pelvis, until we  were below the inflamed colon.  A  TA 60 stapler was used to divide the distal sigmoid colon.  This was  amputated with a knife and the specimen was passed off the field.  The  rectal stump was marked with two interrupted 2-0 Prolene sutures.  The  appendices epiploica were tacked to the sacral promontory with interrupted 3-  0 silk ties to keep it in position for future colostomy reversal.  We then  thoroughly irrigated the pelvis.  There was a lot of inflammation in the  left lower quadrant.  The left ureter was identified and was not involved.  A #19 Blake drain was brought out through a stab incision in the right lower  quadrant and placed deep in the pelvis.  A piece of Seprafilm was placed  over the rectal stump and the small bowel was released from the retractor  and allowed to rest over the Seprafilm.  We then selected our ostomy site on  the left side just below the level of the umbilicus.  A circle of skin was  amputated and cautery was used to remove the subcutaneous fat down to the  fascia.  The fascia was opened in cruciate fashion large enough to allow two  fingers to pass easily.  A Babcock clamp was used to grasp the stapled end  of the descending colon and bring it up through the ostomy site.  The ostomy  was secured with two interrupted 2-0 silk sutures on the undersurface of the  abdominal wall.  Three fascial sutures of 2-0 silk were also used to attach  the ostomy to the fascia.  Once all of our sponge and instrument counts were  correct, we then closed the abdomen with a #1 double-stranded PDS suture in  running fashion.  The subcutaneous tissues were irrigated and skin staples  used to reapproximate the skin.  We then amputated the staple line of the  descending colostomy and matured it with interrupted 3-0 Vicryl sutures.  An  ostomy appliance was cut to fit and clean dressing was placed over the staple line.  The patient was then extubated, brought to recovery in  stable  condition.  All sponge, instrument, needle counts correct.      Wilmon Arms. Tsuei, M.D.  Electronically Signed     MKT/MEDQ  D:  05/26/2005  T:  05/27/2005  Job:  [id number]

## 2010-06-08 NOTE — Consult Note (Signed)
NAMEMADALYNN, [name] NO.:  [id number]   MEDICAL RECORD NO.:  [id number]          PATIENT TYPE:  EMS   LOCATION:  MAJO                         FACILITY:  [facility]   PHYSICIAN:  Wilmon Arms. Corliss Skains, M.D. DATE OF BIRTH:  Feb 15, 1955   DATE OF CONSULTATION:  05/25/2005  DATE OF DISCHARGE:                                   CONSULTATION   CONSULTING PHYSICIAN:  Coralie Carpen, M.D., internal medicine teaching  service.   CHIEF COMPLAINT:  Lower abdominal pain, bloody diarrhea.   HISTORY OF PRESENT ILLNESS:  The patient is a 55 year old female who  presents with three to four weeks of worsening lower abdominal pain,  constipation, intermittent fever, occasional bloody diarrhea.  She also  reports hematemesis this morning.  The patient has a history of alcoholism,  pancreatitis and chronic constipation.  She also has a family history of  diverticulitis.  She was brought to the emergency department where she was  evaluated. She was noted to be hemodynamically stable and afebrile, but had  an elevated white blood cell count.  She is being admitted by the teaching  service.   MEDICATIONS:  None.   ALLERGIES:  PENICILLIN which causes hives.  CODEINE which causes nausea and  vomiting.   PAST SURGICAL HISTORY:  None.   PAST MEDICAL HISTORY:  1.  Alcoholism.  2.  Pancreatitis.  3.  Psoriasis.   SOCIAL HISTORY:  Patient is a heavy drinker and smokes regularly.   REVIEW OF SYSTEMS:  The patient has had a previous episode of  diverticulitis.  She has not previously been hospitalized for this problem.  She has been hospitalized twice in the last two years for alcoholic  pancreatitis.   PHYSICAL EXAMINATION:  GENERAL APPEARANCE:  This is a thin white female in  no apparent distress.  She is lying comfortably in the bed.  VITAL SIGNS:  Temperature 98, blood pressure 114/70, pulse 81, respirations  18, saturations 95% on room air.  HEENT:  EOMI.  Sclerae are anicteric.  NECK:  No masses, no thyromegaly.  LUNGS:  Clear to auscultation bilaterally.  Normal respiratory effort.  CARDIOVASCULAR:  Regular rate and rhythm, no murmur.  ABDOMEN:  Positive bowel sounds, soft, nondistended, mildly tender in the  suprapubic region as well as in the left lower quadrant.  There is some  voluntary guarding.  No rebound.  EXTREMITIES:  No edema.  SKIN:  Warm and dry with no sign of jaundice.  NEUROLOGIC:  Awake, alert and oriented x3.   LABORATORY DATA:  Blood work shows a white blood cell count of 17.3,  hemoglobin 11.4, platelet count 669.  PT is 14.8, INR 1.1.  Stool is heme-  negative.  Electrolytes show sodium 137, potassium 3, chloride 104, bicarb  25, BUN 7, creatinine 0.8.  Total bilirubin 0.5.   CT scan of the abdomen shows a normal-appearing appendix.  The entire colon  is full of stool.  There is variable wall thickening and some mucosal  enhancement throughout the colon which is most marked in the distal  transverse colon, descending and sigmoid.  The patient has multiple  diverticula in the descending and sigmoid colon.  Around the sigmoid colon  proximally there are multiple small extraluminal fluid collections  containing extraluminal gas bubbles.  The largest measures 2 cm.  These  findings are consistent with a possible contained perforation of sigmoid  diverticulitis.   IMPRESSION:  1.  Sigmoid diverticulitis with several contained perforations.  2.  Possible gastrointestinal bleed, although hemoglobin is normal.   RECOMMENDATIONS:  Would recommend aggressive nonsurgical treatment at this  time with intravenous antibiotics, hydration and bowel rest.  Would monitor  her hemoglobin.  If the patient vomits, would recommend testing the vomitus  for blood.  If the patient's clinical status worsens, she may require urgent  exploration with colectomy and temporary colostomy.  Will continue to follow  with you.      Wilmon Arms. Tsuei, M.D.   Electronically Signed     MKT/MEDQ  D:  05/25/2005  T:  05/27/2005  Job:  [id number]

## 2010-06-08 NOTE — H&P (Signed)
NAMEMALACHI, [name]                 ACCOUNT NO.:  [id number]   MEDICAL RECORD NO.:  [id number]          PATIENT TYPE:  INP   LOCATION:  1823                         FACILITY:  [facility]   PHYSICIAN:  Hal T. Stoneking, M.D. DATE OF BIRTH:  1955/08/20   DATE OF ADMISSION:  05/22/2004  DATE OF DISCHARGE:                                HISTORY & PHYSICAL   IDENTIFYING DATA:  [name] is a 55 year old white female who is an  alcoholic.  She has had at least four episodes of pancreatitis in the past,  last back in August of 2005.  She does have a history of cirrhosis written  on her chart.  However, the most recent ultrasound done in August showed  that her liver was normal.  She states that she drinks on a regular basis,  usually vodka, although she cannot be specific about the amount.  States she  has never been through DTs.  States her last drink was about three days ago.  Yesterday morning she developed abdominal pain with persistent nausea and  vomiting.  No hematemesis.  She had occasional diarrhea, but saw no blood.  She has had no shaking chills or fever.   ALLERGIES:  She has had allergic reaction to CODEINE, caused a rash.   CURRENT MEDICATIONS:  None.   PAST MEDICAL HISTORY:  Remarkable for alcoholism complicated by  pancreatitis, psoriasis.  She has had no surgeries in the past.  No history  in the past of hypertension, coronary artery disease, cancer, or diabetes.   FAMILY HISTORY:  There is a family history of colon cancer and she had a  colonoscopy in 2005.  Colon cancer in her mother, also she had alcoholism.  Father died with lung cancer.  She has six siblings.  One sister has had  cervical cancer.   SOCIAL HISTORY:  She has been married twice.  She has two children by her  previous marriage.  She smokes one pack of cigarettes per day.  Vodka in  varying amounts.  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  Currently  unemployed.   REVIEW OF SYSTEMS:  No headache.  No change in her vision.  No shortness of  breath or chest pain.  GI:  Please see HPI.   PHYSICAL EXAMINATION:  VITAL SIGNS:  Blood pressure 184/102, temperature  97.5, O2 saturation 95%, pulse 83, respiratory rate 20.  SKIN:  She does have some circumferential scaly, erythematous lesions on the  back of her right hand consistent with psoriasis.  HEENT:  Pupils are equal, round, and reactive to light.  Disks are sharp.  TMs normal.  Oropharynx dry.  LUNGS:  Clear.  HEART:  Regular rate and rhythm without murmurs, rubs, or gallops.  ABDOMEN:  Active bowel sounds, diffuse tenderness throughout.  No masses  felt.  Tenderness seems to be more significant in the right upper quadrant.  NEUROLOGIC:  Unremarkable.   LABORATORY DATA:  Sodium 138, chloride 104, potassium 2.9, BUN 8, creatinine  0.9, glucose 121.  White count 12,400  with a left shift, hemoglobin 15,  platelet count 374,000.  UA revealed 11-20 wbc's, remarkable for bacteria.  SGOT 24, SGPT 13, alkaline phosphatase 122, total bilirubin 1.0, lipase  greater than 2000, amylase 41.   ASSESSMENT:  1. Pancreatitis.  Had a negative ultrasound August 2005, therefore I      suspect this is indeed a similar presentation due to alcohol.  She will      need to be off the alcohol and get some bowel rest.  2. Alcoholism.  She does have a positive family history in her mother.      Will need to pursue AA at discharge.  3. Elevated blood pressure possibly due to the pain.  Will need to follow      and monitor as she improves.  4. Dehydration, hypokalemia.  Will support with intravenous fluids and      potassium replacement.  Check her laboratories tomorrow morning.        HTS/MEDQ  D:  05/22/2004  T:  05/22/2004  Job:  [id number]

## 2010-06-08 NOTE — Op Note (Signed)
[name], [name]:  [id number]   MEDICAL RECORD NO.:  [id number]                   PATIENT TYPE:  AMB   LOCATION:  ENDO                                 FACILITY:  [facility]   PHYSICIAN:  Danise Edge, M.D.                DATE OF BIRTH:  1955/12/30   DATE OF PROCEDURE:  12/30/2002  DATE OF DISCHARGE:                                 OPERATIVE REPORT   PROCEDURE PERFORMED:  Screening colonoscopy.   ENDOSCOPIST:  Charolett Bumpers, M.D.   INDICATIONS FOR PROCEDURE:  [name] s a 55 year old female born  November 13, 1955.  [name] mother was diagnosed with colon cancer when  she was in her early 33s.  [name] is scheduled to undergo a screening  colonoscopy with polypectomy to prevent colon cancer.   PREMEDICATION:  Versed 10 mg, Demerol 100 mg.   DESCRIPTION OF PROCEDURE:  After obtaining informed consent, the patient was  placed in the left lateral decubitus position.  I administered intravenous  Demerol and intravenous Versed to achieve conscious sedation for the  procedure.  The patient's blood pressure, oxygen saturations and cardiac  rhythm were monitored throughout the procedure and documented in the medical  record.   Anal inspection was normal.  Digital rectal exam was normal.  The pediatric  Olympus adjustable colonoscope was introduced into the rectum and advanced  to the cecum.  [name] has a redundant left colon with extensive left  colonic diverticulosis making it somewhat difficult to negotiate this area  of the colon with the colonoscope.  Colonic preparation for the exam today  was satisfactory.  [name] has universal colonic diverticulosis most  concentrated in the left colon without diverticulitis or diverticular  stricture formation.   Rectum:  Normal.   Sigmoid colon and descending colon:  A 2 mm red fold was biopsied with the  cold biopsy forceps to determine if this is a small polyp.  I was unable  to  get an electrocautery snare around this area.   Splenic flexure:  Normal.   Transverse colon:  Normal.   Hepatic flexure:  Normal.   Ascending colon:  Normal.   Cecum and ileocecal valve:  Normal.   ASSESSMENT:  1. Universal colonic diverticulosis.  2. A 2 mm red fold in the sigmoid colon at 30 cm from the anal verge was     cold biopsied.   RECOMMENDATIONS:  Repeat colonoscopy in five years.                                               Danise Edge, M.D.    MJ/MEDQ  D:  12/30/2002  T:  12/31/2002  Job:  [id number]   cc:   Ike Bene, M.D.  [address]  [place]  [place] [address]  Fax: [phone]

## 2010-06-08 NOTE — Discharge Summary (Signed)
[name], [name] NO.:  [id number]   MEDICAL RECORD NO.:  [id number]          PATIENT TYPE:  INP   LOCATION:  6742                         FACILITY:  [facility]   PHYSICIAN:  Ladell Pier, M.D.   DATE OF BIRTH:  [birth year]   DATE OF ADMISSION:  05/22/2004  DATE OF DISCHARGE:  05/25/2004                                 DISCHARGE SUMMARY   DISCHARGE DIAGNOSES:  1.  Acute pancreatitis secondary to alcohol.  2.  Urinary tract infection.  3.  Pneumonia.  4.  Alcohol abuse.  5.  Psoriasis.  6.  Tobacco abuse.   DISCHARGE MEDICATIONS:  1.  Thiamine 100 mg daily.  2.  Folic acid 1 mg daily.  3.  Avelox 400 mg p.o. daily for 7 days.  4.  Albuterol MDI p.r.n.   FOLLOWUP APPOINTMENTS:  Patient to follow up with Dr. Olena Leatherwood in 1 week.   PROCEDURES:  None.   HISTORY OF PRESENT ILLNESS:  Patient is a 55 year old white female who  presented to the emergency room with pancreatitis.  She has had four  episodes in the past, last one was in August 2002.  She has a history of  cirrhosis written on her chart, most recent ultrasound was normal.  She  drinks vodka on a regular basis, she has never had DTs, her last drink was a  drink 3 days prior to presentation.  She came in with abdominal pain, nausea  and vomiting.   PAST MEDICAL HISTORY, FAMILY HISTORY, SOCIAL HISTORY, MEDICATIONS, ALLERGIES  AND REVIEW OF SYSTEMS:  See admission H&P.   PHYSICAL EXAMINATION ON DISCHARGE:  VITAL SIGNS:  Temperature T-max 100.3,  pulse of 89, respirations 20, blood pressure 137/78, pulse oximetry 96% on  room air.  HEENT:  Head normocephalic, atraumatic.  Pupils equal, round and reactive to  light.  Throat without erythema.  CARDIOVASCULAR:  Regular rate and rhythm.  No murmurs, rubs, or gallops.  LUNGS:  Crackles in the bases bilaterally.  ABDOMEN:  Positive bowel sounds.  EXTREMITIES:  Without edema.   HOSPITAL COURSE:  Problem 1. ACUTE PANCREATITIS.  She was admitted, given IV  fluids and Dilaudid for pain management.  Her amylase was followed.  Her  symptoms of pain improved.  She was put on clear liquids to advance to  regular diet which she tolerated without nausea or vomiting or abdominal  pain.  She was counseled on not drinking.  Care manager was consulted to  discuss with her AA, she has done that in the past and has failed, also  discussed with patient that it will be very bad for her to drink with her  history of recurrent pancreatitis.   Problem 2. URINARY TRACT INFECTION.  She was treated for a UTI with Cipro x3  days.   Problem 3. PNEUMONIA.  She was noted to have hypoxia.  Pulse oximetry down  into the 90s on room air.  She told me that she was short of breath over the  past few days.  Chest x-ray showed question of an infiltrate.  She had a D-  dimer done that was elevated, a spiral CT was done that showed pleural  effusion and a question of pneumonia, she was placed on Avelox, she felt  much better, she was given breathing treatments and that seems to have  improved.   LABORATORIES ON DISCHARGE:  Amylase 140, lipase 46, WBC 12.9, hemoglobin  13.3, hematocrit 39, platelets 235, sodium 134, potassium 3.9, chloride 102,  CO2 27, glucose 101, BUN less than 1, creatinine 0.8, AST 17, ALT 8, calcium  8.5.  Chest CT showed very small bilateral pleural effusion with some  bibasilar atelectasis and patchy airspace disease most notable in the right  base, infectious versus inflammatory process, no focus of dense consolidate  is identified.      NJ/MEDQ  D:  05/25/2004  T:  05/26/2004  Job:  [id number]

## 2010-06-08 NOTE — Discharge Summary (Signed)
NAMEKAZI, MONTORO NO.:  [id number]   MEDICAL RECORD NO.:  [id number]                   PATIENT TYPE:  INP   LOCATION:  3030                                 FACILITY:  [facility]   PHYSICIAN:  Ike Bene, M.D.              DATE OF BIRTH:  Jul 26, 1955   DATE OF ADMISSION:  08/29/2003  DATE OF DISCHARGE:  09/02/2003                                 DISCHARGE SUMMARY   DISCHARGE DIAGNOSES:  1.  Pancreatitis.  2.  Alcohol abuse.   [name] was admitted to the hospital secondary to acute onset of  pancreatic pain and abdominal pain which was relatively sudden in onset.  She had had a significant amount of alcohol four days prior to admission.  Her past medical history was significant for cirrhosis as well as a  pancreatitis in 1988.  She had not had daily alcohol consumption, but  rather, seemed to drink on an episodic basis.   At time of admission patient was treated with clear liquids, IV fluids, and  followed her laboratory closely.  She was given nicotine patch for nicotine  withdrawal.  Because she did not have daily alcohol consumption we did not  give her delirium tremens or anxiety prophylaxis for alcohol withdrawal.   Patient did seem to resolve well within 24 hours, although not complete.  We  slowly advanced her diet.  She still had some significant pain.  Right upper  quadrant ultrasound which was performed was within normal limits.  Her  lipase dropped from 155 to 89 within 24 hours.  Third day of admission  patient was still quite tearful, complaining of pain.  Her lipase had  dropped to within normal limits down from 89 to 47.   Patient continued to improve with conservative therapy only.  IV fluids,  clear liquids.  Last 24 hours of her admission her diet was advanced.  She  was able to tolerate a low fat diet without significant abdominal pain.  Patient was given appropriate counseling on the usage of alcohol.   DISCHARGE  MEDICATIONS:  1.  Xanax 0.5 q.8h. p.r.n.  2.  Vicodin one to two q.6h. as needed.  3.  Nexium 40 mg daily.   Her medications were called to the [facility] at [facility].  She was markedly improved.  She will stay on a low fat diet.  Follow up with  Dr. Merril Abbe in one to two weeks.       RM/MEDQ  D:  10/07/2003  T:  10/09/2003  Job:  [id number]

## 2010-06-08 NOTE — Discharge Summary (Signed)
[name], [name] NO.:  [id number]   MEDICAL RECORD NO.:  [id number]           [name] TYPE:   LOCATION:                               FACILITY:  [facility]   PHYSICIAN:  [name], M.D.   DATE OF BIRTH:  [birth year]   DATE OF ADMISSION:  05/25/2005  DATE OF DISCHARGE:  06/01/2005                                 DISCHARGE SUMMARY   OPERATIVE SURGEON:  Wilmon Arms. Tsuei, M.D.   CHIEF COMPLAINT/REASON FOR ADMISSION:  [name] is a 55 year old female who  has had 2-3 weeks of lower abdominal pain, intermittent fevers, and  occasional bloody diarrhea, who presented to [name] ER and was admitted by [name]  teaching service after CT scan revealed sigmoid diverticulitis with  perforation and small abscesses.   Surgical consultation was obtained on [name] date of admission.  Dr. Corliss Skains  reviewed [name] CT scans and examined [name].  [name] CT showed again  diverticulitis with several contained perfs and on exam [name] had  bowel sounds.  Her abdomen was flat, nondistended.  She was tender in [name]  suprapubic area, mainly in [name] left lower quadrant with mild guarding, no  rebounding.  He felt that it was appropriate to watch [name], treat her  conservatively with medical therapy, n.p.o. status, and IV antibiotics.  [name]  [name] was admitted with [name] following diagnoses:  1.  Acute diverticulitis with contained abscesses.  2.  History of abdominal abuse.  This is past history of pancreatitis due to      alcoholism.   HOSPITAL COURSE:  [name] was admitted to [name] floor where she was placed  on n.p.o. status, aggressive IV flow hydration, and was started on empiric  antibiotic therapy with Cipro and Flagyl.   Within [name] first 24 hours she was reevaluated by surgery.  She continued to  have diarrhea, increasing abdominal pain, hypotension, with blood pressure  down to 77 systolic, T-max 101.8.  She had increased tenderness in [name] left  lower quadrant.  It was felt  at this point that [name] needed to go to  [name] OR for exploratory laparotomy.  Dr. Corliss Skains did take [name] to [name] OR  with a preoperative diagnosis of perforated sigmoid diverticulitis.  She  underwent an exploratory laparotomy with sigmoid colectomy and Hartman's  procedure and colostomy.   [name] had an NG tube placed postoperatively.  On postoperative day #1  she was doing quite well.  She was having some issues related to abdominal  pain so Toradol was added to her regimen.  She had a mild bump in her white  count of 18,400.  Otherwise labs and vital signs were stable.  She had no  bowel sounds and NG tube was left in place.   Throughout [name] remainder of [name] hospital stay [name] did quite nicely.  Her incisions remained clean, dry, and intact without drainage or  induration.  Her stoma was pink and [name] wound care nurse also began  following [name] and instructing [name] in appropriate ostomy  care.  Subsequent intraoperative wound cultures were positive for Strep group C.  [name] had also had a JP drain placed intraoperatively to assist with  abscesses and she had minimal drainage from this.  By postoperative day #4  JP was ready to be discontinued and we removed this.  Foley was  discontinued.  She began to have flatus through her ostomy so her diet was  advanced and by Jun 01, 2005, [name] was tolerating a diet, ambulating  well.  Wound was unremarkable and she was deemed appropriate for discharge  home.   FINAL DISCHARGE DIAGNOSES:  1.  Perforated diverticulitis, status post colectomy/colostomy.  2.  Hypokalemia, resolved.  3.  History of prior pancreatitis due to alcohol abuse.   DISCHARGE MEDICATIONS:  Percocet 5/325 1-2 every 4-6 hours as needed for  pain.   RETURN TO WORK:  This is to be determined by Dr. Corliss Skains at follow-up.   ACTIVITY:  Increase activity slowly.  No driving for two weeks.  No lifting  for five weeks.   DIET:   No restrictions.   WOUND CARE:  Return to office next week for staple removal.  You need to  call to arrange an appointment with Dr. Corliss Skains at [phone] to obtain this  appointment.   HOME HEALTH:  [name] will be followed by [facility] in regards  to her ostomy.  [name] has demonstrated competence n ostomy care prior  to discharge.      Allison L. Rennis Harding, N.P.      [name], M.D.  Electronically Signed    ALE/MEDQ  D:  06/25/2005  T:  06/25/2005  Job:  [id number]   cc:   Internal Medicine Teaching Service

## 2010-10-15 LAB — COMPREHENSIVE METABOLIC PANEL
ALT: 14
ALT: 17
AST: 19
Albumin: 3.8
Alkaline Phosphatase: 92
CO2: 23
CO2: 26
Calcium: 8.9
GFR calc Af Amer: >60
GFR calc non Af Amer: >60
GFR calc non Af Amer: >60
Glucose, Bld: 146 — ABNORMAL HIGH
Potassium: 3.4 — ABNORMAL LOW
Sodium: 135
Sodium: 136
Total Protein: 7.4

## 2010-10-15 LAB — CBC
HCT: 36.3
HCT: 41.3
HCT: 41.6
Hemoglobin: 12.4
Hemoglobin: 12.5
Hemoglobin: 14.1
Hemoglobin: 14.6
MCHC: 33.5
MCHC: 34
MCHC: 34.2
MCHC: 34.4
MCHC: 34.5
MCV: 89.9
Platelets: 243
Platelets: 252
Platelets: 347
RBC: 4
RBC: 4.01
RBC: 4.22
RBC: 4.52
RBC: 4.73
RDW: 13.1
RDW: 13.1
RDW: 13.2
RDW: 13.3
WBC: 11.9 — ABNORMAL HIGH
WBC: 14.5 — ABNORMAL HIGH

## 2010-10-15 LAB — URINALYSIS, ROUTINE W REFLEX MICROSCOPIC
Bilirubin Urine: NEGATIVE
Glucose, UA: NEGATIVE
Ketones, ur: NEGATIVE
Protein, ur: NEGATIVE

## 2010-10-15 LAB — RAPID URINE DRUG SCREEN, HOSP PERFORMED
Opiates: POSITIVE — AB
Tetrahydrocannabinol: NOT DETECTED

## 2010-10-15 LAB — BASIC METABOLIC PANEL
BUN: 4 — ABNORMAL LOW
CO2: 24
Calcium: 8.7
Creatinine, Ser: 0.78
GFR calc Af Amer: >60
GFR calc non Af Amer: >60
GFR calc non Af Amer: >60
Glucose, Bld: 100 — ABNORMAL HIGH
Potassium: 4
Sodium: 134 — ABNORMAL LOW
Sodium: 139

## 2010-10-15 LAB — HEMOGLOBIN A1C
Hgb A1c MFr Bld: 5.7
Mean Plasma Glucose: 126

## 2010-10-15 LAB — DIFFERENTIAL
Basophils Relative: 0
Eosinophils Absolute: 0.1
Monocytes Relative: 6
Neutrophils Relative %: 82 — ABNORMAL HIGH

## 2010-10-15 LAB — URINE CULTURE: Colony Count: >=100000

## 2010-10-15 LAB — BASIC METABOLIC PANEL WITH GFR
BUN: 3 — ABNORMAL LOW
CO2: 23
Calcium: 8.9
Chloride: 103
Creatinine, Ser: 0.78
GFR calc non Af Amer: >60
Glucose, Bld: 86
Potassium: 4
Sodium: 138

## 2010-10-15 LAB — PROTIME-INR: Prothrombin Time: 12.3

## 2010-10-15 LAB — LIPID PANEL
HDL: 59
Total CHOL/HDL Ratio: 4.3
VLDL: 13

## 2010-10-15 LAB — URINE MICROSCOPIC-ADD ON

## 2010-10-15 LAB — ETHANOL: Alcohol, Ethyl (B): <5

## 2010-10-18 LAB — BASIC METABOLIC PANEL
BUN: 7
CO2: 26
Calcium: 9.9
Chloride: 107
Creatinine, Ser: 0.76
Creatinine, Ser: 0.8
GFR calc Af Amer: >60
GFR calc non Af Amer: >60
Glucose, Bld: 111 — ABNORMAL HIGH
Potassium: 4.9
Sodium: 138

## 2010-10-18 LAB — DIFFERENTIAL
Basophils Absolute: 0.1
Eosinophils Relative: 3
Lymphocytes Relative: 24
Lymphs Abs: 1.5
Neutro Abs: 4.1
Neutrophils Relative %: 65

## 2010-10-18 LAB — CBC
HCT: 42.5
Hemoglobin: 13.5
MCHC: 33.7
MCV: 91.6
Platelets: 275
RBC: 4.36
RDW: 13.5
WBC: 17.5 — ABNORMAL HIGH
WBC: 6.4

## 2010-10-19 LAB — DIFFERENTIAL
Eosinophils Relative: 0
Lymphs Abs: 1.5
Monocytes Absolute: 1.1 — ABNORMAL HIGH
Monocytes Relative: 9
Neutro Abs: 9.9 — ABNORMAL HIGH

## 2010-10-19 LAB — CBC
HCT: 32.5 — ABNORMAL LOW
HCT: 36.5
HCT: 38.3
HCT: 42.9
Hemoglobin: 10.8 — ABNORMAL LOW
Hemoglobin: 13
MCHC: 33.3
MCHC: 34.2
MCHC: 34.5
MCV: 89
MCV: 89.6
MCV: 89.6
MCV: 90
Platelets: 308
RBC: 3.62 — ABNORMAL LOW
RBC: 4.27
RBC: 4.79
RDW: 12.8
RDW: 13
WBC: 12.5 — ABNORMAL HIGH
WBC: 8

## 2010-10-19 LAB — BASIC METABOLIC PANEL
BUN: 6
CO2: 26
Chloride: 100
GFR calc Af Amer: >60
Potassium: 4.1

## 2010-10-19 LAB — COMPREHENSIVE METABOLIC PANEL
Alkaline Phosphatase: 55
BUN: 19
Calcium: 7.8 — ABNORMAL LOW
Creatinine, Ser: 0.97
Glucose, Bld: 121 — ABNORMAL HIGH
Potassium: 3.7
Total Protein: 5.3 — ABNORMAL LOW

## 2010-10-19 LAB — OCCULT BLOOD X 1 CARD TO LAB, STOOL: Fecal Occult Bld: POSITIVE

## 2010-10-19 LAB — LIPASE, BLOOD: Lipase: 45

## 2010-10-23 LAB — BASIC METABOLIC PANEL
BUN: 1 — ABNORMAL LOW
BUN: 9
BUN: 9
CO2: 24
CO2: 31
Calcium: 8.8
Calcium: 9.4
Chloride: 100
Chloride: 102
GFR calc non Af Amer: >60
GFR calc non Af Amer: >60
GFR calc non Af Amer: >60
Glucose, Bld: 115 — ABNORMAL HIGH
Glucose, Bld: 91
Glucose, Bld: 99
Potassium: 3 — ABNORMAL LOW
Potassium: 3.2 — ABNORMAL LOW
Potassium: 4
Sodium: 137
Sodium: 138
Sodium: 139

## 2010-10-23 LAB — CBC
HCT: 35.9 — ABNORMAL LOW
HCT: 39.3
Hemoglobin: 12
Hemoglobin: 13.1
MCHC: 33.2
MCV: 90.2
Platelets: 261
Platelets: 301
RBC: 3.98
RDW: 14.9
RDW: 15.3
WBC: 10.8 — ABNORMAL HIGH
WBC: 5.9

## 2013-01-21 DIAGNOSIS — K219 Gastro-esophageal reflux disease without esophagitis: Secondary | ICD-10-CM

## 2013-01-21 DIAGNOSIS — E785 Hyperlipidemia, unspecified: Secondary | ICD-10-CM

## 2013-01-21 DIAGNOSIS — F419 Anxiety disorder, unspecified: Secondary | ICD-10-CM

## 2013-01-21 DIAGNOSIS — I4819 Other persistent atrial fibrillation: Secondary | ICD-10-CM

## 2013-01-21 DIAGNOSIS — I10 Essential (primary) hypertension: Secondary | ICD-10-CM

## 2013-01-21 HISTORY — DX: Hyperlipidemia, unspecified: E78.5

## 2013-01-21 HISTORY — DX: Other persistent atrial fibrillation: I48.19

## 2013-01-21 HISTORY — DX: Essential (primary) hypertension: I10

## 2013-01-21 HISTORY — DX: Gastro-esophageal reflux disease without esophagitis: K21.9

## 2013-01-21 HISTORY — DX: Anxiety disorder, unspecified: F41.9

## 2013-05-06 ENCOUNTER — Emergency Department (HOSPITAL_COMMUNITY): Payer: Medicaid Other

## 2013-05-06 ENCOUNTER — Inpatient Hospital Stay (HOSPITAL_COMMUNITY)
Admission: EM | Admit: 2013-05-06 | Discharge: 2013-05-15 | DRG: 438 | Disposition: A | Discharge status: Skilled Nursing Facility | Payer: Medicaid Other | Attending: Internal Medicine | Admitting: Internal Medicine

## 2013-05-06 ENCOUNTER — Encounter (HOSPITAL_COMMUNITY): Payer: Self-pay | Admitting: Emergency Medicine

## 2013-05-06 DIAGNOSIS — D72829 Elevated white blood cell count, unspecified: Secondary | ICD-10-CM | POA: Diagnosis present

## 2013-05-06 DIAGNOSIS — Z933 Colostomy status: Secondary | ICD-10-CM | POA: Diagnosis not present

## 2013-05-06 DIAGNOSIS — K219 Gastro-esophageal reflux disease without esophagitis: Secondary | ICD-10-CM | POA: Diagnosis present

## 2013-05-06 DIAGNOSIS — R7309 Other abnormal glucose: Secondary | ICD-10-CM | POA: Diagnosis present

## 2013-05-06 DIAGNOSIS — Z880 Allergy status to penicillin: Secondary | ICD-10-CM | POA: Diagnosis not present

## 2013-05-06 DIAGNOSIS — I2510 Atherosclerotic heart disease of native coronary artery without angina pectoris: Secondary | ICD-10-CM | POA: Diagnosis present

## 2013-05-06 DIAGNOSIS — I2589 Other forms of chronic ischemic heart disease: Secondary | ICD-10-CM | POA: Diagnosis present

## 2013-05-06 DIAGNOSIS — IMO0002 Reserved for concepts with insufficient information to code with codable children: Secondary | ICD-10-CM | POA: Diagnosis not present

## 2013-05-06 DIAGNOSIS — J9620 Acute and chronic respiratory failure, unspecified whether with hypoxia or hypercapnia: Secondary | ICD-10-CM | POA: Diagnosis present

## 2013-05-06 DIAGNOSIS — I69998 Other sequelae following unspecified cerebrovascular disease: Secondary | ICD-10-CM

## 2013-05-06 DIAGNOSIS — E785 Hyperlipidemia, unspecified: Secondary | ICD-10-CM | POA: Diagnosis present

## 2013-05-06 DIAGNOSIS — K859 Acute pancreatitis without necrosis or infection, unspecified: Secondary | ICD-10-CM | POA: Diagnosis present

## 2013-05-06 DIAGNOSIS — E7800 Pure hypercholesterolemia, unspecified: Secondary | ICD-10-CM | POA: Diagnosis present

## 2013-05-06 DIAGNOSIS — F17200 Nicotine dependence, unspecified, uncomplicated: Secondary | ICD-10-CM | POA: Diagnosis present

## 2013-05-06 DIAGNOSIS — I10 Essential (primary) hypertension: Secondary | ICD-10-CM | POA: Diagnosis present

## 2013-05-06 DIAGNOSIS — D539 Nutritional anemia, unspecified: Secondary | ICD-10-CM | POA: Diagnosis present

## 2013-05-06 DIAGNOSIS — F1010 Alcohol abuse, uncomplicated: Secondary | ICD-10-CM

## 2013-05-06 DIAGNOSIS — I428 Other cardiomyopathies: Secondary | ICD-10-CM | POA: Diagnosis present

## 2013-05-06 DIAGNOSIS — E876 Hypokalemia: Secondary | ICD-10-CM | POA: Diagnosis present

## 2013-05-06 DIAGNOSIS — I209 Angina pectoris, unspecified: Secondary | ICD-10-CM | POA: Diagnosis present

## 2013-05-06 DIAGNOSIS — I208 Other forms of angina pectoris: Secondary | ICD-10-CM

## 2013-05-06 DIAGNOSIS — E875 Hyperkalemia: Secondary | ICD-10-CM | POA: Diagnosis present

## 2013-05-06 DIAGNOSIS — Z8249 Family history of ischemic heart disease and other diseases of the circulatory system: Secondary | ICD-10-CM | POA: Diagnosis not present

## 2013-05-06 DIAGNOSIS — R6510 Systemic inflammatory response syndrome (SIRS) of non-infectious origin without acute organ dysfunction: Secondary | ICD-10-CM | POA: Diagnosis present

## 2013-05-06 DIAGNOSIS — J449 Chronic obstructive pulmonary disease, unspecified: Secondary | ICD-10-CM | POA: Diagnosis present

## 2013-05-06 DIAGNOSIS — E46 Unspecified protein-calorie malnutrition: Secondary | ICD-10-CM | POA: Diagnosis present

## 2013-05-06 DIAGNOSIS — F1011 Alcohol abuse, in remission: Secondary | ICD-10-CM | POA: Diagnosis present

## 2013-05-06 DIAGNOSIS — J4489 Other specified chronic obstructive pulmonary disease: Secondary | ICD-10-CM | POA: Diagnosis present

## 2013-05-06 DIAGNOSIS — J9600 Acute respiratory failure, unspecified whether with hypoxia or hypercapnia: Secondary | ICD-10-CM | POA: Diagnosis present

## 2013-05-06 DIAGNOSIS — T4275XA Adverse effect of unspecified antiepileptic and sedative-hypnotic drugs, initial encounter: Secondary | ICD-10-CM | POA: Diagnosis present

## 2013-05-06 DIAGNOSIS — I5021 Acute systolic (congestive) heart failure: Secondary | ICD-10-CM | POA: Diagnosis present

## 2013-05-06 DIAGNOSIS — I509 Heart failure, unspecified: Secondary | ICD-10-CM | POA: Diagnosis present

## 2013-05-06 DIAGNOSIS — I959 Hypotension, unspecified: Secondary | ICD-10-CM | POA: Diagnosis present

## 2013-05-06 DIAGNOSIS — I5041 Acute combined systolic (congestive) and diastolic (congestive) heart failure: Secondary | ICD-10-CM | POA: Diagnosis present

## 2013-05-06 DIAGNOSIS — F411 Generalized anxiety disorder: Secondary | ICD-10-CM | POA: Diagnosis present

## 2013-05-06 DIAGNOSIS — D696 Thrombocytopenia, unspecified: Secondary | ICD-10-CM | POA: Diagnosis present

## 2013-05-06 DIAGNOSIS — Z720 Tobacco use: Secondary | ICD-10-CM | POA: Diagnosis present

## 2013-05-06 HISTORY — DX: Anxiety disorder, unspecified: F41.9

## 2013-05-06 HISTORY — DX: Acute pancreatitis without necrosis or infection, unspecified: K85.90

## 2013-05-06 HISTORY — DX: Cerebral infarction, unspecified: I63.9

## 2013-05-06 HISTORY — DX: Alcohol abuse, uncomplicated: F10.10

## 2013-05-06 HISTORY — DX: Diverticulosis of intestine, part unspecified, without perforation or abscess without bleeding: K57.90

## 2013-05-06 HISTORY — DX: Gastro-esophageal reflux disease without esophagitis: K21.9

## 2013-05-06 HISTORY — DX: Hyperlipidemia, unspecified: E78.5

## 2013-05-06 HISTORY — DX: Essential (primary) hypertension: I10

## 2013-05-06 LAB — COMPREHENSIVE METABOLIC PANEL
ALBUMIN: 3.8 g/dL (ref 3.5–5.2)
ALK PHOS: 113 U/L (ref 39–117)
ALT: 31 U/L (ref 0–35)
AST: 41 U/L — ABNORMAL HIGH (ref 0–37)
BILIRUBIN TOTAL: 0.2 mg/dL — AB (ref 0.3–1.2)
BUN: 9 mg/dL (ref 6–23)
CHLORIDE: 103 meq/L (ref 96–112)
CO2: 20 mEq/L (ref 19–32)
Calcium: 9 mg/dL (ref 8.4–10.5)
Creatinine, Ser: 1.07 mg/dL (ref 0.50–1.10)
GFR calc Af Amer: 65 mL/min — ABNORMAL LOW (ref >90)
GFR calc non Af Amer: 56 mL/min — ABNORMAL LOW (ref >90)
GLUCOSE: 113 mg/dL — AB (ref 70–99)
POTASSIUM: 3.2 meq/L — AB (ref 3.7–5.3)
SODIUM: 145 meq/L (ref 137–147)
TOTAL PROTEIN: 7.4 g/dL (ref 6.0–8.3)

## 2013-05-06 LAB — LIPID PANEL
CHOL/HDL RATIO: 4.3 ratio
CHOLESTEROL: 243 mg/dL — AB (ref 0–200)
HDL: 57 mg/dL (ref >39)
LDL Cholesterol: 152 mg/dL — ABNORMAL HIGH (ref 0–99)
TRIGLYCERIDES: 172 mg/dL — AB (ref <150)
VLDL: 34 mg/dL (ref 0–40)

## 2013-05-06 LAB — CBC WITH DIFFERENTIAL/PLATELET
BASOS PCT: 0 % (ref 0–1)
Basophils Absolute: 0 10*3/uL (ref 0.0–0.1)
Eosinophils Absolute: 0.1 10*3/uL (ref 0.0–0.7)
Eosinophils Relative: 1 % (ref 0–5)
HCT: 48.2 % — ABNORMAL HIGH (ref 36.0–46.0)
HEMOGLOBIN: 16.7 g/dL — AB (ref 12.0–15.0)
LYMPHS ABS: 1.3 10*3/uL (ref 0.7–4.0)
Lymphocytes Relative: 10 % — ABNORMAL LOW (ref 12–46)
MCH: 33.6 pg (ref 26.0–34.0)
MCHC: 34.6 g/dL (ref 30.0–36.0)
MCV: 97 fL (ref 78.0–100.0)
MONOS PCT: 6 % (ref 3–12)
Monocytes Absolute: 0.9 10*3/uL (ref 0.1–1.0)
NEUTROS ABS: 11.2 10*3/uL — AB (ref 1.7–7.7)
NEUTROS PCT: 83 % — AB (ref 43–77)
PLATELETS: 282 10*3/uL (ref 150–400)
RBC: 4.97 MIL/uL (ref 3.87–5.11)
RDW: 13.4 % (ref 11.5–15.5)
WBC: 13.4 10*3/uL — ABNORMAL HIGH (ref 4.0–10.5)

## 2013-05-06 LAB — URINALYSIS, ROUTINE W REFLEX MICROSCOPIC
Bilirubin Urine: NEGATIVE
GLUCOSE, UA: NEGATIVE mg/dL
HGB URINE DIPSTICK: NEGATIVE
Ketones, ur: 15 mg/dL — AB
Nitrite: NEGATIVE
PH: 5 (ref 5.0–8.0)
PROTEIN: NEGATIVE mg/dL
SPECIFIC GRAVITY, URINE: 1.017 (ref 1.005–1.030)
Urobilinogen, UA: 0.2 mg/dL (ref 0.0–1.0)

## 2013-05-06 LAB — LACTIC ACID, PLASMA: LACTIC ACID, VENOUS: 1.3 mmol/L (ref 0.5–2.2)

## 2013-05-06 LAB — I-STAT TROPONIN, ED: Troponin i, poc: 0.01 ng/mL (ref 0.00–0.08)

## 2013-05-06 LAB — URINE MICROSCOPIC-ADD ON

## 2013-05-06 LAB — LIPASE, BLOOD: LIPASE: 2791 U/L — AB (ref 11–59)

## 2013-05-06 LAB — ETHANOL: Alcohol, Ethyl (B): <11 mg/dL (ref 0–11)

## 2013-05-06 MED ORDER — HYDROMORPHONE HCL PF 1 MG/ML IJ SOLN
1.0000 mg | INTRAMUSCULAR | Status: DC | PRN
  Administered 2013-05-06 – 2013-05-07 (×7): 2 mg via INTRAVENOUS
  Filled 2013-05-06 (×8): qty 2

## 2013-05-06 MED ORDER — LORAZEPAM 1 MG PO TABS: 0.0000 mg | ORAL_TABLET | Freq: Four times a day (QID) | ORAL | Status: DC

## 2013-05-06 MED ORDER — FOLIC ACID 1 MG PO TABS
1.0000 mg | ORAL_TABLET | Freq: Every day | ORAL | Status: DC
  Administered 2013-05-08 – 2013-05-15 (×8): 1 mg via ORAL
  Filled 2013-05-06 (×10): qty 1

## 2013-05-06 MED ORDER — LORAZEPAM 1 MG PO TABS: 0.0000 mg | ORAL_TABLET | Freq: Two times a day (BID) | ORAL | Status: DC

## 2013-05-06 MED ORDER — LORAZEPAM 2 MG/ML IJ SOLN
1.0000 mg | Freq: Four times a day (QID) | INTRAMUSCULAR | Status: DC | PRN
  Administered 2013-05-07: 1 mg via INTRAVENOUS
  Filled 2013-05-06: qty 1

## 2013-05-06 MED ORDER — ADULT MULTIVITAMIN W/MINERALS CH
1.0000 | ORAL_TABLET | Freq: Every day | ORAL | Status: DC
Start: 2013-05-06 — End: 2013-05-12
  Administered 2013-05-08 – 2013-05-12 (×4): 1 via ORAL
  Filled 2013-05-06 (×7): qty 1

## 2013-05-06 MED ORDER — PANTOPRAZOLE SODIUM 40 MG IV SOLR
40.0000 mg | Freq: Two times a day (BID) | INTRAVENOUS | Status: DC
  Administered 2013-05-06 – 2013-05-11 (×10): 40 mg via INTRAVENOUS
  Filled 2013-05-06 (×12): qty 40

## 2013-05-06 MED ORDER — POTASSIUM CHLORIDE CRYS ER 20 MEQ PO TBCR
40.0000 meq | EXTENDED_RELEASE_TABLET | ORAL | Status: AC
  Administered 2013-05-06: 40 meq via ORAL
  Filled 2013-05-06: qty 2

## 2013-05-06 MED ORDER — SODIUM CHLORIDE 0.9 % IV SOLN
INTRAVENOUS | Status: DC
  Administered 2013-05-07: 04:00:00 via INTRAVENOUS

## 2013-05-06 MED ORDER — SODIUM CHLORIDE 0.9 % IV SOLN
1000.0000 mL | Freq: Once | INTRAVENOUS | Status: AC
  Administered 2013-05-06: 1000 mL via INTRAVENOUS

## 2013-05-06 MED ORDER — VITAMIN B-1 100 MG PO TABS
100.0000 mg | ORAL_TABLET | Freq: Every day | ORAL | Status: DC
Start: 2013-05-06 — End: 2013-05-15
  Administered 2013-05-08 – 2013-05-15 (×8): 100 mg via ORAL
  Filled 2013-05-06 (×10): qty 1

## 2013-05-06 MED ORDER — HYDROMORPHONE HCL PF 1 MG/ML IJ SOLN
1.0000 mg | Freq: Once | INTRAMUSCULAR | Status: AC
  Administered 2013-05-06: 1 mg via INTRAVENOUS
  Filled 2013-05-06: qty 1

## 2013-05-06 MED ORDER — ACETAMINOPHEN 650 MG RE SUPP: 650.0000 mg | Freq: Four times a day (QID) | RECTAL | Status: DC | PRN

## 2013-05-06 MED ORDER — ONDANSETRON HCL 4 MG/2ML IJ SOLN
4.0000 mg | INTRAMUSCULAR | Status: DC | PRN
  Administered 2013-05-06 – 2013-05-15 (×17): 4 mg via INTRAVENOUS
  Filled 2013-05-06 (×18): qty 2

## 2013-05-06 MED ORDER — ACETAMINOPHEN 325 MG PO TABS: 650.0000 mg | ORAL_TABLET | Freq: Four times a day (QID) | ORAL | Status: DC | PRN

## 2013-05-06 MED ORDER — BISACODYL 5 MG PO TBEC
10.0000 mg | DELAYED_RELEASE_TABLET | Freq: Every day | ORAL | Status: DC | PRN
Start: 2013-05-06 — End: 2013-05-15

## 2013-05-06 MED ORDER — MORPHINE SULFATE 4 MG/ML IJ SOLN
4.0000 mg | Freq: Once | INTRAMUSCULAR | Status: AC
  Administered 2013-05-06: 4 mg via INTRAVENOUS
  Filled 2013-05-06: qty 1

## 2013-05-06 MED ORDER — ONDANSETRON HCL 4 MG/2ML IJ SOLN
4.0000 mg | Freq: Once | INTRAMUSCULAR | Status: AC
  Administered 2013-05-06: 4 mg via INTRAVENOUS
  Filled 2013-05-06: qty 2

## 2013-05-06 MED ORDER — THIAMINE HCL 100 MG/ML IJ SOLN
100.0000 mg | Freq: Every day | INTRAMUSCULAR | Status: DC
  Filled 2013-05-06 (×2): qty 1

## 2013-05-06 MED ORDER — SODIUM CHLORIDE 0.9 % IV SOLN: Freq: Once | INTRAVENOUS | Status: DC

## 2013-05-06 MED ORDER — ENOXAPARIN SODIUM 40 MG/0.4ML ~~LOC~~ SOLN
40.0000 mg | SUBCUTANEOUS | Status: DC
  Administered 2013-05-07 – 2013-05-14 (×7): 40 mg via SUBCUTANEOUS
  Filled 2013-05-06 (×10): qty 0.4

## 2013-05-06 MED ORDER — LORAZEPAM 1 MG PO TABS: 1.0000 mg | ORAL_TABLET | Freq: Four times a day (QID) | ORAL | Status: DC | PRN

## 2013-05-06 NOTE — Progress Notes (Signed)
Report received from Santiago Glad in ED.

## 2013-05-06 NOTE — ED Notes (Signed)
Pt presents from home thru EMS with c/o of nausea, vomiting and abdominal pain that started at midnight.  Pt is unsure to the number of vomiting episodes since midnight.  With EMS, given 4mg  Zofran and having dry heaves.  The pain in the abdomen is in the upper left and right quadrants.

## 2013-05-06 NOTE — H&P (Signed)
[facility] History and Physical  [name] HEN:[id number] DOB: 1955-01-29 DOA: 05/06/2013  Referring physician: EDP PCP: No PCP Per Patient   Chief Complaint: abd pain with N/V  HPI: [name] is a 58 y.o. female with PMH significant for pancreatitis, alcohol abuse/binge drinking and other medical problems as listed below who presents with the above complaints. She states that she began having severe abd pain with associated N/V at about 12:30 AM. The emesis was non bloody and she states that pain was upper abdominal-more on L. Side. She relates that her last drink was Tues and that she usually drinks liquor but states she cannot quantify. In the ED her alcohol level was <11, lipase in 2700s and k,3.2 . She was treated with multiple doses of analgesics and is admitted for further eval and management.    Review of Systems The patient denies anorexia, fever, weight loss, vision loss, decreased hearing, hoarseness, chest pain, syncope, dyspnea on exertion, peripheral edema, balance deficits, hemoptysis, melena, hematochezia, severe indigestion/heartburn, hematuria, incontinence, genital sores, muscle weakness, suspicious skin lesions, transient blindness.   Past Medical History  Diagnosis Date  . Diverticulosis   . Pancreatitis   . Hypertension   . Stroke   . Hyperlipidemia   . Shortness of breath   . Anxiety   . GERD (gastroesophageal reflux disease)    Past Surgical History  Procedure Laterality Date  . Colostomy    . Ileostomy    . Colostomy takedown     Social History:  reports that she has been smoking Cigarettes.  She has a 40 pack-year smoking history. She has never used smokeless tobacco. She reports that she drinks alcohol. She reports that she does not use illicit drugs.  Allergies  Allergen Reactions  . Penicillins Rash    History reviewed. No pertinent family history.   Prior to Admission medications   Not on File   Physical Exam: Filed Vitals:   05/06/13 1409  BP: 106/70  Pulse: 85  Temp: 97.5 F (36.4 C)  Resp: 16    BP 106/70  Pulse 85  Temp(Src) 97.5 F (36.4 C) (Oral)  Resp 16  Ht 5\' 3"  (1.6 m)  Wt 63.504 kg (140 lb)  BMI 24.81 kg/m2  SpO2 87% Constitutional: Vital signs reviewed.  Patient is a well-developed and well-nourished x3 in no acute distress and cooperative with exam. Alert and oriented x3.  Head: Normocephalic and atraumatic Mouth: no erythema or exudates, slightly dry MM Eyes: PERRL, EOMI, conjunctivae normal, No scleral icterus.  Neck: Supple, Trachea midline normal ROM, No JVD, mass, thyromegaly, or carotid bruit present.  Cardiovascular: RRR, S1 normal, S2 normal, no MRG, pulses symmetric and intact bilaterally Pulmonary/Chest: normal respiratory effort, CTAB, no wheezes, rales, or rhonchi Abdominal: Soft. +upper abd tenderness > in LUQ, non-distended, bowel sounds are normal, no masses, organomegaly, or guarding present.  GU: no CVA tenderness Musculoskeletal: No joint deformities, erythema, or stiffness, ROM full and no nontender Hematology: no cervical, inginal, or axillary adenopathy.  Neurological: A&O x3, Strength is normal and symmetric bilaterally, cranial nerve II-XII are grossly intact, no focal motor deficit, sensory intact to light touch bilaterally. No tremors Skin: Warm, dry and intact. No rash.  Psychiatric: Normal mood and affect. speech and behavior is normal.               Labs on Admission:  Basic Metabolic Panel:  Recent Labs Lab 05/06/13 0424  NA 145  K 3.2*  CL 103  CO2 20  GLUCOSE 113*  BUN 9  CREATININE 1.07  CALCIUM 9.0   Liver Function Tests:  Recent Labs Lab 05/06/13 0424  AST 41*  ALT 31  ALKPHOS 113  BILITOT 0.2*  PROT 7.4  ALBUMIN 3.8    Recent Labs Lab 05/06/13 0424  LIPASE 2791*   No results found for this basename: AMMONIA,  in the last 168 hours CBC:  Recent Labs Lab 05/06/13 0424  WBC 13.4*  NEUTROABS 11.2*  HGB 16.7*  HCT  48.2*  MCV 97.0  PLT 282   Cardiac Enzymes: No results found for this basename: CKTOTAL, CKMB, CKMBINDEX, TROPONINI,  in the last 168 hours  BNP (last 3 results) No results found for this basename: PROBNP,  in the last 8760 hours CBG: No results found for this basename: GLUCAP,  in the last 168 hours  Radiological Exams on Admission: Dg Abd Acute W/chest  05/06/2013   CLINICAL DATA:  Emesis, abdominal pain  EXAM: ACUTE ABDOMEN SERIES (ABDOMEN 2 VIEW & CHEST 1 VIEW)  COMPARISON:  None.  FINDINGS: There is no evidence of dilated bowel loops or free intraperitoneal air. No radiopaque calculi or other significant radiographic abnormality is seen. Heart size and mediastinal contours are within normal limits. Both lungs are clear.  IMPRESSION: Negative abdominal radiographs.  No acute cardiopulmonary disease.   Electronically Signed   By: Kathreen Devoid   On: 05/06/2013 05:06      Assessment/Plan Active Problems:  Acute Pancreatitis -As discussed above, secondary to alcohol -will keep NPO, antiemetics, pain management -counseled to quit alcohol   Hypertension -no meds outpt, monitor and treat accordingly   GERD (gastroesophageal reflux disease) -PPI   Hypokalemia -Replace k, d/t GI losses   Leukocytosis, unspecified -likely d/t inflammation, check urine cx and follow     Code Status: full Family Communication: partner at bedside Disposition Plan: admitted to med surge  Time spent: >30  [facility] Pager [phone]

## 2013-05-06 NOTE — ED Provider Notes (Signed)
CSN: [id number]     Arrival date & time 05/06/13  8295 History   None    Chief Complaint  Patient presents with  . Emesis  . Abdominal Pain     (Consider location/radiation/quality/duration/timing/severity/associated sxs/prior Treatment) HPI  Patient presents with abdominal pain and emesis. Has a history of diverticulosis and multiple abdominal surgeries requiring colostomy and ileostomy status post reversal. Patient reports that she had onset of epigastric abdominal pain at approximately 12:30 AM. She rates the pain is sharp and crampy and radiating to her back. Currently her pain is 10 out of 10. She denies any diarrhea. She does endorse nonbilious, nonbloody emesis.  Nothing makes the pain better or worse. She denies any recent fevers. She has had normal bowel movements.  Past Medical History  Diagnosis Date  . Diverticulosis    Past Surgical History  Procedure Laterality Date  . Colostomy    . Ileostomy     History reviewed. No pertinent family history. History  Substance Use Topics  . Smoking status: Current Every Day Smoker  . Smokeless tobacco: Never Used  . Alcohol Use: Yes   OB History   Grav Para Term Preterm Abortions TAB SAB Ect Mult Living                 Review of Systems  Constitutional: Negative for fever.  Respiratory: Negative for cough, chest tightness and shortness of breath.   Cardiovascular: Negative for chest pain.  Gastrointestinal: Positive for nausea, vomiting and abdominal pain. Negative for diarrhea and constipation.  Genitourinary: Negative for dysuria and hematuria.  Musculoskeletal: Positive for back pain.  Skin: Negative for rash.  Neurological: Negative for headaches.  Psychiatric/Behavioral: Negative for confusion.  All other systems reviewed and are negative.     Allergies  Penicillins  Home Medications   Prior to Admission medications   Not on File   BP 185/99  Pulse 92  Temp(Src) 97.6 F (36.4 C) (Oral)  Resp 16  Ht  5\' 3"  (1.6 m)  Wt 140 lb (63.504 kg)  BMI 24.81 kg/m2  SpO2 100% Physical Exam  Nursing note and vitals reviewed. Constitutional: She is oriented to person, place, and time.  Disheveled, uncomfortable appearing  HENT:  Head: Normocephalic and atraumatic.  Mucous membranes dry, poor dentition  Eyes: Pupils are equal, round, and reactive to light.  Neck: Neck supple.  Cardiovascular: Normal rate, regular rhythm and normal heart sounds.   No murmur heard. Pulmonary/Chest: Effort normal and breath sounds normal. No respiratory distress. She has no wheezes.  Abdominal: Soft. Bowel sounds are normal. She exhibits no distension. There is tenderness. There is no rebound and no guarding.  Multiple abdominal scars that are well-healed, no signs of peritonitis  Musculoskeletal: She exhibits no edema.  Neurological: She is alert and oriented to person, place, and time.  Skin: Skin is warm and dry.  Psychiatric: She has a normal mood and affect.    ED Course  Procedures (including critical care time) Labs Review Labs Reviewed  CBC WITH DIFFERENTIAL - Abnormal; Notable for the following:    WBC 13.4 (*)    Hemoglobin 16.7 (*)    HCT 48.2 (*)    Neutrophils Relative % 83 (*)    Neutro Abs 11.2 (*)    Lymphocytes Relative 10 (*)    All other components within normal limits  COMPREHENSIVE METABOLIC PANEL - Abnormal; Notable for the following:    Potassium 3.2 (*)    Glucose, Bld 113 (*)  AST 41 (*)    Total Bilirubin 0.2 (*)    GFR calc non Af Amer 56 (*)    GFR calc Af Amer 65 (*)    All other components within normal limits  LIPASE, BLOOD - Abnormal; Notable for the following:    Lipase 2791 (*)    All other components within normal limits  LACTIC ACID, PLASMA  URINALYSIS, ROUTINE W REFLEX MICROSCOPIC  LIPID PANEL  I-STAT TROPOININ, ED  I-STAT TROPOININ, ED    Imaging Review Dg Abd Acute W/chest  05/06/2013   CLINICAL DATA:  Emesis, abdominal pain  EXAM: ACUTE ABDOMEN  SERIES (ABDOMEN 2 VIEW & CHEST 1 VIEW)  COMPARISON:  None.  FINDINGS: There is no evidence of dilated bowel loops or free intraperitoneal air. No radiopaque calculi or other significant radiographic abnormality is seen. Heart size and mediastinal contours are within normal limits. Both lungs are clear.  IMPRESSION: Negative abdominal radiographs.  No acute cardiopulmonary disease.   Electronically Signed   By: Kathreen Devoid   On: 05/06/2013 05:06     EKG Interpretation   Date/Time:  Thursday May 06 2013 04:22:48 EDT Ventricular Rate:  88 PR Interval:  129 QRS Duration: 104 QT Interval:  407 QTC Calculation: 492 R Axis:   76 Text Interpretation:  Sinus rhythm Ventricular premature complex Probable  left ventricular hypertrophy Nonspecific T abnormalities, inferior leads  Borderline prolonged QT interval Confirmed by HORTON  MD, COURTNEY ([id number])  on 05/06/2013 5:26:21 AM      MDM   Final diagnoses:  Pancreatitis    Patient presents with epigastric abdominal pain and vomiting. She is uncomfortable but nontoxic on exam. Vital signs are reassuring. No signs of peritonitis. Patient is at risk for bowel extraction given prior abdominal surgery; however, patient's abdomen is soft and nondistended.  Workup notable for leukocytosis to 13.4. Lactate is normal. Potassium is low at 3.2.  Lipase is acutely elevated at 2791. Patient reports history of high cholesterol. She also reports frequent binge drinking; however, she denies any recent binge drinking.  Patient was given fluids, pain medication, and anti-medic. She is currently n.p.o. Patient will be admitted for acute management of pancreatitis.   Merryl Hacker, MD 05/06/13 [phone]

## 2013-05-06 NOTE — Progress Notes (Signed)
Patient admitted to unit by stretcher. Belongings at bedside. Patient alert and oriented. [facility] admissions paged, patient without orders. Patient has ecchymosis and flaky, dry skin bilateral arms. States that she has a hx of falls. Placed on bed alarm. Call bell within reach, pt instructed on how to use call bell. Awaiting MD orders. Will continue to monitor.

## 2013-05-06 NOTE — Progress Notes (Signed)
Pt arrived to [facility] from ED, A/Ox4, c/o abd pain7/10 and nausea, Dr. Dillard Essex notified of arrival, husband at bedside

## 2013-05-07 ENCOUNTER — Inpatient Hospital Stay (HOSPITAL_COMMUNITY): Payer: Medicaid Other

## 2013-05-07 ENCOUNTER — Encounter (HOSPITAL_COMMUNITY): Payer: Self-pay | Admitting: Pulmonary Disease

## 2013-05-07 DIAGNOSIS — R6510 Systemic inflammatory response syndrome (SIRS) of non-infectious origin without acute organ dysfunction: Secondary | ICD-10-CM | POA: Diagnosis present

## 2013-05-07 LAB — CBC
HCT: 46 % (ref 36.0–46.0)
Hemoglobin: 14.7 g/dL (ref 12.0–15.0)
MCH: 33.3 pg (ref 26.0–34.0)
MCHC: 32 g/dL (ref 30.0–36.0)
MCV: 104.1 fL — AB (ref 78.0–100.0)
Platelets: 163 10*3/uL (ref 150–400)
RBC: 4.42 MIL/uL (ref 3.87–5.11)
RDW: 13.9 % (ref 11.5–15.5)
WBC: 20.1 10*3/uL — ABNORMAL HIGH (ref 4.0–10.5)

## 2013-05-07 LAB — BASIC METABOLIC PANEL
BUN: 13 mg/dL (ref 6–23)
BUN: 13 mg/dL (ref 6–23)
CALCIUM: 8.3 mg/dL — AB (ref 8.4–10.5)
CALCIUM: 9 mg/dL (ref 8.4–10.5)
CO2: 20 mEq/L (ref 19–32)
CO2: 19 mEq/L (ref 19–32)
CREATININE: 0.96 mg/dL (ref 0.50–1.10)
CREATININE: 0.93 mg/dL (ref 0.50–1.10)
Chloride: 110 mEq/L (ref 96–112)
Chloride: 109 mEq/L (ref 96–112)
GFR calc non Af Amer: 64 mL/min — ABNORMAL LOW (ref >90)
GFR, EST AFRICAN AMERICAN: 74 mL/min — AB (ref >90)
GFR, EST AFRICAN AMERICAN: 77 mL/min — AB (ref >90)
GFR, EST NON AFRICAN AMERICAN: 66 mL/min — AB (ref >90)
Glucose, Bld: 90 mg/dL (ref 70–99)
Glucose, Bld: 91 mg/dL (ref 70–99)
Potassium: 5.6 mEq/L — ABNORMAL HIGH (ref 3.7–5.3)
Potassium: 4.2 mEq/L (ref 3.7–5.3)
SODIUM: 145 meq/L (ref 137–147)
Sodium: 146 mEq/L (ref 137–147)

## 2013-05-07 LAB — URINE CULTURE
CULTURE: NO GROWTH
Colony Count: NO GROWTH

## 2013-05-07 LAB — MRSA PCR SCREENING: MRSA by PCR: NEGATIVE

## 2013-05-07 LAB — LIPASE, BLOOD: LIPASE: 702 U/L — AB (ref 11–59)

## 2013-05-07 LAB — BLOOD GAS, ARTERIAL
Acid-base deficit: 4.9 mmol/L — ABNORMAL HIGH (ref 0.0–2.0)
Bicarbonate: 20.2 mEq/L (ref 20.0–24.0)
DRAWN BY: 275531
O2 Content: 6 L/min
O2 Saturation: 95.2 %
PCO2 ART: 41.3 mmHg (ref 35.0–45.0)
PH ART: 7.312 — AB (ref 7.350–7.450)
Patient temperature: 98.6
TCO2: 21.5 mmol/L (ref 0–100)
pO2, Arterial: 79.2 mmHg — ABNORMAL LOW (ref 80.0–100.0)

## 2013-05-07 MED ORDER — SODIUM CHLORIDE 0.9 % IV SOLN
500.0000 mg | Freq: Three times a day (TID) | INTRAVENOUS | Status: DC
  Administered 2013-05-07 – 2013-05-08 (×2): 500 mg via INTRAVENOUS
  Filled 2013-05-07 (×6): qty 500

## 2013-05-07 MED ORDER — HYDROMORPHONE HCL PF 1 MG/ML IJ SOLN: 0.5000 mg | INTRAMUSCULAR | Status: DC | PRN

## 2013-05-07 MED ORDER — NALOXONE HCL 0.4 MG/ML IJ SOLN
INTRAMUSCULAR | Status: AC
  Filled 2013-05-07: qty 1

## 2013-05-07 MED ORDER — LORAZEPAM 2 MG/ML IJ SOLN
2.0000 mg | INTRAMUSCULAR | Status: DC | PRN
  Administered 2013-05-07 – 2013-05-08 (×3): 3 mg via INTRAVENOUS
  Administered 2013-05-08: 2 mg via INTRAVENOUS
  Filled 2013-05-07 (×2): qty 2
  Filled 2013-05-07: qty 1
  Filled 2013-05-07: qty 2

## 2013-05-07 MED ORDER — FUROSEMIDE 10 MG/ML IJ SOLN
40.0000 mg | Freq: Once | INTRAMUSCULAR | Status: DC
  Filled 2013-05-07: qty 4

## 2013-05-07 MED ORDER — HYDRALAZINE HCL 20 MG/ML IJ SOLN
10.0000 mg | Freq: Once | INTRAMUSCULAR | Status: AC
  Administered 2013-05-07: 10 mg via INTRAVENOUS
  Filled 2013-05-07: qty 1

## 2013-05-07 MED ORDER — NALOXONE HCL 0.4 MG/ML IJ SOLN
0.4000 mg | INTRAMUSCULAR | Status: DC | PRN
  Administered 2013-05-07: 0.4 mg via INTRAVENOUS

## 2013-05-07 MED ORDER — METOPROLOL TARTRATE 1 MG/ML IV SOLN
5.0000 mg | Freq: Once | INTRAVENOUS | Status: AC
  Administered 2013-05-08: 5 mg via INTRAVENOUS
  Filled 2013-05-07: qty 5

## 2013-05-07 MED ORDER — SODIUM POLYSTYRENE SULFONATE 15 GM/60ML PO SUSP
30.0000 g | Freq: Once | ORAL | Status: AC
  Administered 2013-05-07: 30 g via ORAL
  Filled 2013-05-07: qty 120

## 2013-05-07 NOTE — Consult Note (Signed)
PULMONARY / CRITICAL CARE MEDICINE   Name: [name] MRN: [id number] DOB: July 20, 1955    ADMISSION DATE:  05/06/2013 CONSULTATION DATE:  05/07/13  REFERRING MD :  Dr. Hartford Poli PRIMARY SERVICE: TRH  CHIEF COMPLAINT:  Pancreatitis   BRIEF PATIENT DESCRIPTION: 58 y/o F with PMH of ETOH abuse / binge drinking admitted on 4/16 with abdominal pain, nausea / vomiting.     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   Her last drink was on 4/14 - she relays that she has tried to decrease amt and frequency of drinking.  She does not drink on a daily basis.  On admission, her lipase was > 2700 and AST of 41.  She was admitted to medical floor per [facility].  She was made NPO, treated with antiemetics, and narcotics for abd pain.  On am rounds 4/17 she was noted to have altered mental status with shallow respirations after dilaudid administration.  She was given Narcan with increased responsiveness.  PCCM consulted for evaluation. Pt continues to complain of abdominal pain and n/v.    PAST MEDICAL HISTORY :  Past Medical History  Diagnosis Date  . Diverticulosis   . Pancreatitis   . Hypertension   . Stroke   . Hyperlipidemia   . Shortness of breath   . Anxiety   . GERD (gastroesophageal reflux disease)    Past Surgical History  Procedure Laterality Date  . Colostomy    . Ileostomy    . Colostomy takedown     Prior to Admission medications   Not on File   Allergies  Allergen Reactions  . Penicillins Rash    FAMILY HISTORY:  History reviewed. No pertinent family history. SOCIAL HISTORY:  reports that she has been smoking Cigarettes.  She has a 40 pack-year  smoking history. She has never used smokeless tobacco. She reports that she drinks alcohol. She reports that she does not use illicit drugs.  REVIEW OF SYSTEMS:   Gen: Denies fever, chills, weight change, fatigue, night sweats HEENT: Denies blurred vision, double vision, hearing loss, tinnitus, sinus congestion, rhinorrhea, sore throat, neck stiffness, dysphagia PULM: Denies cough, sputum production, hemoptysis, wheezing.  Report SOB when lying flat CV: Denies chest pain, edema, orthopnea, paroxysmal nocturnal dyspnea, palpitations GI: Denies diarrhea, hematochezia, melena, constipation, change in bowel habits.  Reports abdominal pain, nausea, vomiting GU: Denies dysuria, hematuria, polyuria, oliguria, urethral discharge Endocrine: Denies hot or cold intolerance, polyuria, polyphagia or appetite change Derm: Denies rash, dry skin, scaling or peeling skin change Heme: Denies easy bruising, bleeding, bleeding gums Neuro: Denies headache, numbness, weakness, slurred speech, loss of memory or consciousness   SUBJECTIVE:   VITAL SIGNS: Temp:  [97.5 F (36.4 C)-97.7 F (36.5 C)] 97.7 F (36.5 C) (04/17 0455) Pulse Rate:  [85-108] 108 (04/17 1104) Resp:  [16-18] 18 (04/17 1104) BP: (90-172)/(65-112) 90/65 mmHg (04/17 1104) SpO2:  [87 %-97 %] 92 % (04/17 1104)  HEMODYNAMICS:    VENTILATOR SETTINGS:    INTAKE / OUTPUT: Intake/Output     04/16 0701 - 04/17 0700 04/17 0701 - 04/18 0700        Urine Occurrence 2  x      PHYSICAL EXAMINATION: General:  Chronically ill in NAD Neuro:  AAOx4, speech clear, MAE HEENT:  Mm pink/moist, no jvd Cardiovascular:  s1s2 rrr, no m/r/g Lungs:  resp's even/non-labored, speaking full sentences, minimal basilar crackles  Abdomen:  Round/firm but not rigid, no rebound tenderness, bsx4 active, midline abd scar Musculoskeletal:  No acute deformities Skin:  Psoriatic lesions on knees / hands, telangectasia's on chest wall   LABS:  CBC  Recent  Labs Lab 05/06/13 0424 05/07/13 0955  WBC 13.4* 20.1*  HGB 16.7* 14.7  HCT 48.2* 46.0  PLT 282 163   Coag's No results found for this basename: APTT, INR,  in the last 168 hours BMET  Recent Labs Lab 05/06/13 0424 05/07/13 0705  NA 145 146  K 3.2* 5.6*  CL 103 110  CO2 20 20  BUN 9 13  CREATININE 1.07 0.96  GLUCOSE 113* 90   Electrolytes  Recent Labs Lab 05/06/13 0424 05/07/13 0705  CALCIUM 9.0 8.3*   Sepsis Markers  Recent Labs Lab 05/06/13 0447  LATICACIDVEN 1.3   ABG  Recent Labs Lab 05/07/13 0905  PHART 7.312*  PCO2ART 41.3  PO2ART 79.2*   Liver Enzymes  Recent Labs Lab 05/06/13 0424  AST 41*  ALT 31  ALKPHOS 113  BILITOT 0.2*  ALBUMIN 3.8   Cardiac Enzymes No results found for this basename: TROPONINI, PROBNP,  in the last 168 hours Glucose No results found for this basename: GLUCAP,  in the last 168 hours  Imaging Dg Chest Port 1 View  05/07/2013   CLINICAL DATA:  Unresponsive, possible aspiration  EXAM: PORTABLE CHEST - 1 VIEW  COMPARISON:  DG ABD ACUTE W/CHEST dated 05/06/2013  FINDINGS: Heart size is mildly enlarged with prominence of the central vasculature but no overt edema. Curvilinear bilateral lower lobe probable atelectasis. Trace pleural effusions are noted. No new bony abnormality.  IMPRESSION: Lung volumes are lower than on the prior exam with curvilinear probable bibasilar atelectasis. Aspiration could appear similar although there is no new lobar consolidation identified.  Trace pleural effusions.   Electronically Signed   By: Conchita Paris M.D.   On: 05/07/2013 10:29   Dg Abd Acute W/chest  05/06/2013   CLINICAL DATA:  Emesis, abdominal pain  EXAM: ACUTE ABDOMEN SERIES (ABDOMEN 2 VIEW & CHEST 1 VIEW)  COMPARISON:  None.  FINDINGS: There is no evidence of dilated bowel loops or free intraperitoneal air. No radiopaque calculi or other significant radiographic abnormality is seen. Heart size and mediastinal contours are within  normal limits. Both lungs are clear.  IMPRESSION: Negative abdominal radiographs.  No acute cardiopulmonary disease.   Electronically Signed   By: Kathreen Devoid   On: 05/06/2013 05:06    ASSESSMENT / PLAN:  INFECTIOUS A:   Leukocytosis  Pancreatitis  P:   -abx as above -follow cultures -see GI  GASTROINTESTINAL A:   Pancreatitis  Suspected Cirrhosis Nausea  GERD Hx Diverticulitis with Ostomy Reversal  P:   -assess abd ultrasound to confirm pancreatitis -consider CT ABD if symptoms / biomarkers worsen -LFT's in am  -ETOH cessation    NEUROLOGIC A:   ETOH Abuse - last drink tues 4/14 Pain  Anxiety  Hx CVA - residual L sided weakness but able to perform ADL's.  Does not drive.  P:   -caution with dilaudid administration, episode of AMS after administration  -CIWA protocol  -PRN Ativan  -folate / MVI / thiamine   PULMONARY A: Trace Bilateral  Pleural Effusions Tobacco Abuse  P:   -trend CXR -smoking cessation   CARDIOVASCULAR A:  HTN HLD P:  -monitor BP trend   RENAL A:   Hyperkalemia  P:   -trend BMP   HEMATOLOGIC A:   Macrocytic Anemia  Thrombocytopenia  P:  -monitor CBC -folate / mvi / thiamine as above   ENDOCRINE A:   Hyperglycemia   P:   -mild, monitor glucose on bmp     Noe Gens, NP-C [facility] Pgr: [phone] or [phone]    I have personally obtained a history, examined the patient, evaluated laboratory and imaging results, formulated the assessment and plan and placed orders.  Merton Border, MD ; [facility] [phone].  After 5:30 PM or weekends, call [phone]    05/07/2013, 11:50 AM

## 2013-05-07 NOTE — Progress Notes (Signed)
[facility] PROGRESS NOTE  [name] WUJ:[id number] DOB: [birth year] DOA: 05/06/2013 PCP: No PCP Per Patient  HPI [name] is a 58 y.o. female with PMH significant for pancreatitis, alcohol abuse/binge drinking and other medical problems as listed below who presented to the ED with abd pain and N/V. She stated that she began having severe abd pain with associated N/V at about 12:30 AM 05/06/13. The emesis was non bloody and she states that pain was upper abdominal-more on L. side. She related that her last drink was Tues and that she usually drinks liquor but states she cannot quantify. In the ED her alcohol level was <11, lipase in 2700s and k,3.2 . She was treated with multiple doses of analgesics and was admitted for further eval and management.  Subjective: Upon rounds this am (8:30am) pt was found slumped over the bed sideways, unresponsive to voice and sternal rub with visible cyanosis and mottling to her hands. Breathing was shallow and slow, +radial pulse ~64 bpm. Pt was repositioned in the bed and laid flat, Rapid response called (see note). Pt had been awake and alert earlier this am, had received pain medication (Dilaudid and Ativan) around 7:44 am. O2 by [place] was placed and V/S reassessed (SaO2 ~60's on RA, 83% with 4LPM).Pt began to open her eyes and startle then would fall back into a stupor. Rapid response administered Narcan and the patient increased in responsiveness although still not awake and alert. Pt is awake but drowsy, c/o SOB when sitting forward, V/S continue to be unstable. CCM consult called and decision to move the patient to [facility] made.  Assessment/Plan:  Active Problems:   Acute Pancreatitis with previous history of same. -Secondary to alcohol (Lipase initially 2791-now 702, abd pain, N/V) -NPO, antiemetics, pain management with Dilaudid (decreased dose) and Ativan -counseled to quit alcohol in ED -Continues to have abdominal pain  Acute Hypoxic  Respiratory Failure Oxygen sats in the 60s momentarily, rebounded to the low 80s on 4L Necessitated rapid response call. Multifactorial - received dilaudid, but likely has chronic respiratory failure as well. Possibly early SIRS from acute pancreatitis. Cardiomegaly and pleural effusion noted on xray 4/17 Critical Care Consultation appreciated.  Hypertension - vacillating SBP 130s to 90s  -No meds outpt, monitor and treat accordingly  -Currently hypotensive  ETOH abuse Reported recent ETOH intake, counseled to quit drinking.  ETOH <11 on 05/06/13 Monitor for possible withdrawal.  CIWA protocol ordered.  GERD (gastroesophageal reflux disease)  -Continue PPI therapy   Hypokalemia/Hyperkalemia  -Replaced K, d/t GI losses  -Initially on KCl PO x1 05/06/13, now hyperkalemic (from 3.2 4/16 to 5.6  4/17). -Monitor for symptoms, recheck BMP, Kayexalate given x 1 dose  Leukocytosis, unspecified  -likely d/t inflammation -urine culture pending, monitor    Code Status: Full Family Communication: No one at bedside Disposition Plan: Transferred to [facility] unit   Consultants:  Critical Care  Antibiotics:  None at this time    Objective: Filed Vitals:   05/07/13 1104  BP: 90/65  Pulse: 108  Temp:   Resp: 18   No intake or output data in the 24 hours ending 05/07/13 1225 Filed Weights   05/06/13 0400  Weight: 63.504 kg (140 lb)    Exam:  Physical Exam  Constitutional:  Unconscious, across the bed  HENT:  Head: Normocephalic and atraumatic.  Eyes: Right pupil is not reactive. Left pupil is not reactive.  Pupils were ~size 2 and nonreactive to light  Cardiovascular: Normal heart sounds and intact  distal pulses.   Respiratory: Bradypnea noted. She has decreased breath sounds.  Respirations were shallow and slow, minor expiratory wheezing noted  GI: She exhibits no distension and no mass.  Post Narcan pt c/o abd tenderness to palpation: LUQ and epigastric region   Neurological: She is unresponsive. GCS eye subscore is 1. GCS verbal subscore is 1. GCS motor subscore is 1.  Initially pt was unable to be awakened but with further stimulation she would startle awake then fall back into a stupor.  Skin: There is cyanosis. There is pallor. Nails show clubbing.  Hands were cool, pale/cyanotic    Data Reviewed: Basic Metabolic Panel:  Recent Labs Lab 05/06/13 0424 05/07/13 0705  NA 145 146  K 3.2* 5.6*  CL 103 110  CO2 20 20  GLUCOSE 113* 90  BUN 9 13  CREATININE 1.07 0.96  CALCIUM 9.0 8.3*   Liver Function Tests:  Recent Labs Lab 05/06/13 0424  AST 41*  ALT 31  ALKPHOS 113  BILITOT 0.2*  PROT 7.4  ALBUMIN 3.8    Recent Labs Lab 05/06/13 0424 05/07/13 0705  LIPASE 2791* 702*   CBC:  Recent Labs Lab 05/06/13 0424 05/07/13 0955  WBC 13.4* 20.1*  NEUTROABS 11.2*  --   HGB 16.7* 14.7  HCT 48.2* 46.0  MCV 97.0 104.1*  PLT 282 163    Studies: Dg Chest Port 1 View  05/07/2013   CLINICAL DATA:  Unresponsive, possible aspiration  EXAM: PORTABLE CHEST - 1 VIEW  COMPARISON:  DG ABD ACUTE W/CHEST dated 05/06/2013  FINDINGS: Heart size is mildly enlarged with prominence of the central vasculature but no overt edema. Curvilinear bilateral lower lobe probable atelectasis. Trace pleural effusions are noted. No new bony abnormality.  IMPRESSION: Lung volumes are lower than on the prior exam with curvilinear probable bibasilar atelectasis. Aspiration could appear similar although there is no new lobar consolidation identified.  Trace pleural effusions.   Electronically Signed   By: Conchita Paris M.D.   On: 05/07/2013 10:29   Dg Abd Acute W/chest  05/06/2013   CLINICAL DATA:  Emesis, abdominal pain  EXAM: ACUTE ABDOMEN SERIES (ABDOMEN 2 VIEW & CHEST 1 VIEW)  COMPARISON:  None.  FINDINGS: There is no evidence of dilated bowel loops or free intraperitoneal air. No radiopaque calculi or other significant radiographic abnormality is seen. Heart  size and mediastinal contours are within normal limits. Both lungs are clear.  IMPRESSION: Negative abdominal radiographs.  No acute cardiopulmonary disease.   Electronically Signed   By: Kathreen Devoid   On: 05/06/2013 05:06    Scheduled Meds: . enoxaparin (LOVENOX) injection  40 mg Subcutaneous Q24H  . folic acid  1 mg Oral Daily  . multivitamin with minerals  1 tablet Oral Daily  . naloxone      . pantoprazole (PROTONIX) IV  40 mg Intravenous Q12H  . thiamine  100 mg Oral Daily   Or  . thiamine  100 mg Intravenous Daily   Continuous Infusions:   Active Problems:   Pancreatitis   Hypertension   GERD (gastroesophageal reflux disease)   Hypokalemia   Leukocytosis, unspecified   Alcohol abuse    Time spent: [place], [place] [phone]  [facility]  If 7PM-7AM, please contact night-coverage at [email or web address], password [facility] 05/07/2013, 12:25 PM  LOS: 1 day

## 2013-05-07 NOTE — Progress Notes (Signed)
Utilization review completed. Zayley Arras, RN, BSN. 

## 2013-05-07 NOTE — Significant Event (Signed)
Rapid Response Event Note  Overview:  Called to assist with unresponsive patient Time Called: [id number] Arrival Time: 0848 Event Type: Neurologic  Initial Focused Assessment:  On arrival patient supine in bed - warm and dry - pink - will open eyes to loud stimulus and shaking but cannot stay awake.  RR reg and unlaboared - shallow - rate 8-10.  Bil BS present equal and coarse Rhonchi - abd soft.  BP 149/98  ST 101 difficult to pick up O2 sat - on 6 liter nasal cannula.     Interventions:  Narcan 0.4 mg IV given.  Placed on telemetry.  Attempted to place on NRB mask - patient began to awaken from Narcan - agiatated and first - states she cannot breathe with mask - changed back to nasal cannula - 6 liters.  Patient with eyes open - following commands - slurred speech at first - anxious - allowed to sit on side of bed.  RR 22 - left hand with cool blue fingertips - right hand warmer with pink nailbeds - O2 sat 95% - ABG done per MD order.  Patient starting to relax.  C/o pain in abdomen.  Co-operative now.  O2 sats 95% on 5 liters now.  Concerned with possibility of aspiration when down.  Rhonchi has cleared some- cough stronger.  Discussed with PA - PCXR ordered.  Placed on continuous pulse ox.  Remains on telemetry. ABG results noted.  0930:   135/87  SR 98 RR 16 O2 sats 95% 5 liters - up to 98% when awake and taking deep breaths.  Suggested to staff to start patient on Incentive Spiro - WBC elevated.   Suggested frequent wake up checks - every 30 minutes for next few hours.  Handoff to Joellen Jersey and MD staff who is rounding.  Will follow.     Event Summary: Name of Physician Notified: Dr. Verdon Cummins [place] PA at  (on unit when RRT called)    at    Outcome: Stayed in room and stabalized  Event End Time: [place]

## 2013-05-07 NOTE — Progress Notes (Signed)
Addendum  Patient seen and examined, chart and data base reviewed.  I agree with the above assessment and plan.  For full details please see Mrs. Imogene Burn PA note.  Patient transferred to step down after she was found unresponsive. No specific episode likely secondary to narcotics.  Patient has acute pancreatitis secondary to alcohol.   PCCM consulted, patient transferred to step down for closer monitoring.   Birdie Hopes, MD [facility] Pager: [phone] 05/07/2013, 6:49 PM

## 2013-05-07 NOTE — Progress Notes (Signed)
Pt. Assessed at 0744, given prn ativan and dilaudid 2mg  for pain. Please refer to [facility]. Pt. Was later checked on, found sleeping on side. Resident found around 0830 by the MD and PA, rapid response called. Please refer to rapid response note.

## 2013-05-07 NOTE — Progress Notes (Signed)
Patient received to room 3S bed13 from [address]. Patient is arousablet and oriented to person,place only. At present patient denies any pain.Patient instructed not to get OOB byself and how to call for the nurse. All 4 siderails in place and bed alarm is on.

## 2013-05-07 NOTE — Progress Notes (Signed)
ANTIBIOTIC CONSULT NOTE - INITIAL  Pharmacy Consult for Primaxin Indication: Pancreatitis  Allergies  Allergen Reactions  . Penicillins Rash    Patient Measurements: Height: 5\' 3"  (160 cm) Weight: 140 lb (63.504 kg) IBW/kg (Calculated) : 52.4 Adjusted Body Weight:    Vital Signs: Temp: 97.7 F (36.5 C) (04/17 0455) Temp src: Oral (04/17 0455) BP: 90/65 mmHg (04/17 1104) Pulse Rate: 108 (04/17 1104) Intake/Output from previous day:   Intake/Output from this shift:    Labs:  Recent Labs  05/06/13 0424 05/07/13 0705 05/07/13 0955  WBC 13.4*  --  20.1*  HGB 16.7*  --  14.7  PLT 282  --  163  CREATININE 1.07 0.96  --    Estimated Creatinine Clearance: 57.3 ml/min (by C-G formula based on Cr of 0.96). No results found for this basename: VANCOTROUGH, VANCOPEAK, VANCORANDOM, GENTTROUGH, GENTPEAK, GENTRANDOM, TOBRATROUGH, TOBRAPEAK, TOBRARND, AMIKACINPEAK, AMIKACINTROU, AMIKACIN,  in the last 72 hours   Microbiology: No results found for this or any previous visit (from the past 720 hour(s)).  Medical History: Past Medical History  Diagnosis Date  . Diverticulosis   . Pancreatitis   . Hypertension   . Stroke   . Hyperlipidemia   . Shortness of breath   . Anxiety   . GERD (gastroesophageal reflux disease)   . ETOH abuse     does not drink every day     Assessment: Abd pain, N/V 58 y/o F with PMH of pancreatitis and alcohol abuse/binging was admitted 4/16 with severe abdominal pain and N/V. Admitting lipase 2791. No meds noted PTA  PMH: pancreatitis, alcholism, tobacco, diverticulosis, HTN, CVA, HLD, anxiety, GERD  4/17 AM: Pt found slumped over in bed, unresponsive, cyanotic, shallow, slow breathing. Thought possibly due to Dilaudid/Ativan given this am; improved somewhat with Narcan. Plan to move pt to ICU due to instability. Labs: pH: 7.31, K=5.6, WBC 20.1, platelets 282>>163.  Anticoagulation: Lovenox 40. Note platelets have declined 282>>163  today.  Infectious Disease: Primaxin for pancreatitis. Afebrile. WBC 13.4>>20.1 today.  Cardiovascular: BP 90/65, HR 108  Endocrinology: N/A. No TSH this admission  Gastrointestinal / Nutrition: IV PPI BID.   Neurology: h/o substance abuse, CVA, and anxiety. See acute events today 4/17 noted above.  Nephrology: Scr 0.96  Pulmonary: 92% on 4L  Hematology / Oncology: see anticoag section  PTA Medication Issues: none  Best Practices: LMWH, PPI  Goal of Therapy:  Treatment of pancreatitis  Plan:  Primaxin 250mg  IV q6hrs.     Cache Decoursey S. Alford Highland, PharmD, [facility] Clinical Staff Pharmacist Pager [phone]  Coralyn Pear Alford Highland 05/07/2013,12:14 PM

## 2013-05-08 DIAGNOSIS — I517 Cardiomegaly: Secondary | ICD-10-CM

## 2013-05-08 DIAGNOSIS — R6510 Systemic inflammatory response syndrome (SIRS) of non-infectious origin without acute organ dysfunction: Secondary | ICD-10-CM

## 2013-05-08 LAB — COMPREHENSIVE METABOLIC PANEL
ALK PHOS: 92 U/L (ref 39–117)
ALT: 19 U/L (ref 0–35)
AST: 36 U/L (ref 0–37)
Albumin: 2.9 g/dL — ABNORMAL LOW (ref 3.5–5.2)
BILIRUBIN TOTAL: 0.4 mg/dL (ref 0.3–1.2)
BUN: 11 mg/dL (ref 6–23)
CHLORIDE: 106 meq/L (ref 96–112)
CO2: 25 meq/L (ref 19–32)
Calcium: 8.6 mg/dL (ref 8.4–10.5)
Creatinine, Ser: 0.92 mg/dL (ref 0.50–1.10)
GFR calc Af Amer: 78 mL/min — ABNORMAL LOW (ref >90)
GFR calc non Af Amer: 67 mL/min — ABNORMAL LOW (ref >90)
Glucose, Bld: 91 mg/dL (ref 70–99)
Potassium: 3.7 mEq/L (ref 3.7–5.3)
Sodium: 145 mEq/L (ref 137–147)
Total Protein: 6.7 g/dL (ref 6.0–8.3)

## 2013-05-08 LAB — CBC
HEMATOCRIT: 45.9 % (ref 36.0–46.0)
HEMOGLOBIN: 14.9 g/dL (ref 12.0–15.0)
MCH: 33.5 pg (ref 26.0–34.0)
MCHC: 32.5 g/dL (ref 30.0–36.0)
MCV: 103.1 fL — ABNORMAL HIGH (ref 78.0–100.0)
Platelets: 154 10*3/uL (ref 150–400)
RBC: 4.45 MIL/uL (ref 3.87–5.11)
RDW: 13.9 % (ref 11.5–15.5)
WBC: 15.1 10*3/uL — ABNORMAL HIGH (ref 4.0–10.5)

## 2013-05-08 LAB — BLOOD GAS, ARTERIAL
ACID-BASE EXCESS: 1.4 mmol/L (ref 0.0–2.0)
Bicarbonate: 25.3 mEq/L — ABNORMAL HIGH (ref 20.0–24.0)
Drawn by: 21338
O2 Content: 3 L/min
O2 Saturation: 89.7 %
PATIENT TEMPERATURE: 98.6
TCO2: 26.5 mmol/L (ref 0–100)
pCO2 arterial: 39.3 mmHg (ref 35.0–45.0)
pH, Arterial: 7.425 (ref 7.350–7.450)
pO2, Arterial: 55.5 mmHg — ABNORMAL LOW (ref 80.0–100.0)

## 2013-05-08 LAB — URINE CULTURE

## 2013-05-08 LAB — LIPASE, BLOOD: LIPASE: 105 U/L — AB (ref 11–59)

## 2013-05-08 MED ORDER — LEVALBUTEROL HCL 0.63 MG/3ML IN NEBU
0.6300 mg | INHALATION_SOLUTION | Freq: Four times a day (QID) | RESPIRATORY_TRACT | Status: DC | PRN
  Filled 2013-05-08: qty 3

## 2013-05-08 MED ORDER — SODIUM CHLORIDE 0.9 % IV SOLN
250.0000 mg | Freq: Four times a day (QID) | INTRAVENOUS | Status: DC
  Administered 2013-05-08 – 2013-05-10 (×7): 250 mg via INTRAVENOUS
  Filled 2013-05-08 (×11): qty 250

## 2013-05-08 MED ORDER — FUROSEMIDE 10 MG/ML IJ SOLN
40.0000 mg | Freq: Once | INTRAMUSCULAR | Status: AC
  Administered 2013-05-08: 40 mg via INTRAVENOUS
  Filled 2013-05-08: qty 4

## 2013-05-08 NOTE — Progress Notes (Signed)
ANTIBIOTIC CONSULT NOTE - FOLLOW UP  Pharmacy Consult for Imipenem Indication: pancreatitis  Allergies  Allergen Reactions  . Penicillins Rash    Patient Measurements: Height: 5\' 3"  (160 cm) Weight: 140 lb (63.504 kg) IBW/kg (Calculated) : 52.4  Vital Signs: Temp: 98.8 F (37.1 C) (04/18 0700) Temp src: Oral (04/18 0700) BP: 157/77 mmHg (04/18 0700) Pulse Rate: 102 (04/18 0700)  Labs:  Recent Labs  05/06/13 0424 05/07/13 0705 05/07/13 0955 05/07/13 1800 05/08/13 0436  WBC 13.4*  --  20.1*  --  15.1*  HGB 16.7*  --  14.7  --  14.9  PLT 282  --  163  --  154  CREATININE 1.07 0.96  --  0.93 0.92   Estimated Creatinine Clearance: 59.8 ml/min (by C-G formula based on Cr of 0.92).  Assessment:  Primaxin begun 4/17 pm for pancreatitis.  Also covering for UTI.  First dose delayed due to IV issues.  Second dose delayed due to availability per RN.  Intended dose 250 mg IV q6hrs, but 500 mg IV q8hrs was ordered. 2nd dose of 500 mg IV given @10am  today, since it was on the floor.   Afebrile, WBC decreasing. Urine culture pending.  Goal of Therapy:  appropriate Primaxin dose for renal function and infection  Plan:   Primaxin 250 mg IV q6hrs to begin at [employer].  Will follow renal function, culture data and clinical progress.  Arty Baumgartner, [place] Pager: [phone] 05/08/2013,12:03 PM

## 2013-05-08 NOTE — Progress Notes (Signed)
PULMONARY / CRITICAL CARE MEDICINE   Name: [name] MRN: [id number] DOB: [birth year]    ADMISSION DATE:  05/06/2013 CONSULTATION DATE:  05/07/13  REFERRING MD :  Dr. Hartford Poli PRIMARY SERVICE: TRH  CHIEF COMPLAINT:  Pancreatitis   BRIEF PATIENT DESCRIPTION: 58 y/o F with PMH of ETOH abuse / binge drinking admitted on 4/16 with abdominal pain, nausea / vomiting.     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   Her last drink was on 4/14 - she relays that she has tried to decrease amt and frequency of drinking.  She does not drink on a daily basis.  On admission, her lipase was > 2700 and AST of 41.  She was admitted to medical floor per [facility].  She was made NPO, treated with antiemetics, and narcotics for abd pain.  On am rounds 4/17 she was noted to have altered mental status with shallow respirations after dilaudid administration.  She was given Narcan with increased responsiveness.  PCCM consulted for evaluation. Pt continues to complain of abdominal pain and n/v.      SUBJECTIVE: Asking bedpan, "hurt", but limited speech, 1 -2 word sentences. Denies dyspnea.  VITAL SIGNS: Temp:  [97.2 F (36.2 C)-98.8 F (37.1 C)] 98.8 F (37.1 C) (04/18 0700) Pulse Rate:  [86-131] 102 (04/18 0700) Resp:  [18-34] 29 (04/18 0700) BP: (90-183)/(65-120) 157/77 mmHg (04/18 0700) SpO2:  [90 %-97 %] 90 % (04/18 0300)  HEMODYNAMICS:    VENTILATOR SETTINGS:    INTAKE / OUTPUT: Intake/Output     04/17 0701 - 04/18 0700 04/18 0701 - 04/19 0700   Urine (mL/kg/hr) 7 (0)    Stool 1 (0)    Total Output 8     Net -8            PHYSICAL EXAMINATION: General:   Chronically ill , seems uncomfortable, restless Neuro:  Not very communicative, but awake, speech clear, MAE HEENT:  Mm pink/moist, no jvd Cardiovascular:  s1s2 rrr, no m/r/g Lungs: Bilateral mild wheeze, no cough, unlabored Abdomen:  Round/firm but not rigid, no rebound tenderness, bsx4 active, midline abd scar Musculoskeletal:  No acute deformities Skin:  Psoriatic lesions on knees / hands, telangectasia's on chest wall   LABS:  CBC  Recent Labs Lab 05/06/13 0424 05/07/13 0955 05/08/13 0436  WBC 13.4* 20.1* 15.1*  HGB 16.7* 14.7 14.9  HCT 48.2* 46.0 45.9  PLT 282 163 154   Coag's No results found for this basename: APTT, INR,  in the last 168 hours BMET  Recent Labs Lab 05/07/13 0705 05/07/13 1800 05/08/13 0436  NA 146 145 145  K 5.6* 4.2 3.7  CL 110 109 106  CO2 20 19 25   BUN 13 13 11   CREATININE 0.96 0.93 0.92  GLUCOSE 90 91 91   Electrolytes  Recent Labs Lab 05/07/13 0705 05/07/13 1800 05/08/13 0436  CALCIUM 8.3* 9.0 8.6   Sepsis Markers  Recent Labs Lab 05/06/13 0447  LATICACIDVEN 1.3   ABG  Recent Labs Lab 05/07/13 0905  PHART 7.312*  PCO2ART 41.3  PO2ART 79.2*   Liver Enzymes  Recent Labs Lab 05/06/13 0424 05/08/13  0436  AST 41* 36  ALT 31 19  ALKPHOS 113 92  BILITOT 0.2* 0.4  ALBUMIN 3.8 2.9*   Cardiac Enzymes No results found for this basename: TROPONINI, PROBNP,  in the last 168 hours Glucose No results found for this basename: GLUCAP,  in the last 168 hours  Imaging US Abdomen Complete  05/07/2013   CLINICAL DATA:  Pancreatitis  EXAM: ULTRASOUND ABDOMEN COMPLETE  COMPARISON:  DG ABD ACUTE W/CHEST dated 05/06/2013; CT ABD W/CM dated 03/23/2007; US ABDOMEN COMPLETE dated 03/24/2007  FINDINGS: Gallbladder:  No gallstones or wall thickening visualized. No sonographic Murphy sign noted.  Common bile duct:  Diameter: 4 mm  Liver:  Subtle focal hyperechogenicity along the gallbladder fossa, probably from focal fatty infiltration but  conceivably a hemangioma. Within normal limits in parenchymal echogenicity.  IVC:  No abnormality visualized.  Pancreas:  Not well seen, possibly due to pancreatitis and/or overlying bowel gas.  Spleen:  Size and appearance within normal limits.  Right Kidney:  Length: 9.4 cm. Echogenicity within normal limits. No mass or hydronephrosis visualized.  Left Kidney:  Length: 10.2 cm. Echogenicity within normal limits. No mass or hydronephrosis visualized.  Abdominal aorta:  No aneurysm visualized.  Other findings:  Trace ascites along the right hepatic lobe margin. Small right pleural effusion.  IMPRESSION: 1. Poor visualization of the pancreas potentially due to pancreatitis and/or overlying bowel gas. Trace ascites along the adjacent liver edge. 2. Gallbladder within normal limits. 3. Focal hyperechogenicity in the liver along the gallbladder fossa, probably from focal fatty infiltration, less likely a hemangioma.   Electronically Signed   By: Sherryl Barters M.D.   On: 05/07/2013 18:41   Dg Chest Port 1 View  05/07/2013   CLINICAL DATA:  Unresponsive, possible aspiration  EXAM: PORTABLE CHEST - 1 VIEW  COMPARISON:  DG ABD ACUTE W/CHEST dated 05/06/2013  FINDINGS: Heart size is mildly enlarged with prominence of the central vasculature but no overt edema. Curvilinear bilateral lower lobe probable atelectasis. Trace pleural effusions are noted. No new bony abnormality.  IMPRESSION: Lung volumes are lower than on the prior exam with curvilinear probable bibasilar atelectasis. Aspiration could appear similar although there is no new lobar consolidation identified.  Trace pleural effusions.   Electronically Signed   By: Conchita Paris M.D.   On: 05/07/2013 10:29    ASSESSMENT / PLAN:  INFECTIOUS A:   Leukocytosis  Pancreatitis  P:   -abx as above -follow cultures -see GI  GASTROINTESTINAL A:   Pancreatitis  Suspected Cirrhosis Nausea  GERD Hx Diverticulitis with Ostomy Reversal  P:   -assess abd  ultrasound to confirm pancreatitis -consider CT ABD if symptoms / biomarkers worsen -LFT's in am  -ETOH cessation    NEUROLOGIC A:   ETOH Abuse - last drink tues 4/14 Pain  Anxiety ? DT's Hx CVA - residual L sided weakness but able to perform ADL's.  Does not drive.  P:   -caution with dilaudid administration, episode of AMS after administration  -CIWA protocol  -PRN Ativan  -folate / MVI / thiamine   PULMONARY A: Trace Bilateral Pleural Effusions Tobacco Abuse  Wheeze new- not described yesterday. Bronchospasm vs fluid overload P:   -trend CXR -smoking cessation  - Xopenex neb prn-Watch I&O  CARDIOVASCULAR A:  HTN HLD P:  -monitor BP trend   RENAL A:   Hyperkalemia Limited output P:   -trend BMP   HEMATOLOGIC A:   Macrocytic Anemia  Thrombocytopenia  P:  -monitor CBC -folate /  mvi / thiamine as above   ENDOCRINE A:   Hyperglycemia   P:   -mild, monitor glucose on bmp      CD Krzysztof Reichelt, MD ; [facility] [phone].  After 5:30 PM or weekends, call [phone]    05/08/2013, 8:22 AM

## 2013-05-08 NOTE — Progress Notes (Addendum)
[facility] PROGRESS NOTE  [name] IZT:[id number] DOB: 1955-08-05 DOA: 05/06/2013 PCP: No PCP Per Patient  HPI [name] is a 58 y.o. female with PMH significant for pancreatitis, alcohol abuse/binge drinking and other medical problems as listed below who presented to the ED with abd pain and N/V. She stated that she began having severe abd pain with associated N/V at about 12:30 AM 05/06/13. The emesis was non bloody and she states that pain was upper abdominal-more on L. side. She related that her last drink was Tues and that she usually drinks liquor but states she cannot quantify. In the ED her alcohol level was <11, lipase in 2700s and k,3.2 . She was treated with multiple doses of analgesics and was admitted for further eval and management.  Subjective: Patient is lethargic, easy to arouse but short attention span. Per RN received Ativan, 3 mg at 0433 and 2 mg at 0752.  Assessment/Plan:  Principal Problem:   Acute pancreatitis Active Problems:   Hypertension   GERD (gastroesophageal reflux disease)   Hypokalemia   Leukocytosis, unspecified   Alcohol abuse   SIRS (systemic inflammatory response syndrome)   Acute Pancreatitis with previous history of same. -Secondary to alcohol (Lipase initially 2791-now 105, abd pain, N/V) -NPO, antiemetics, pain management with Dilaudid (decreased dose). -counseled to quit alcohol in ED -Continues to have abdominal pain  Acute Hypoxic Respiratory Failure -Oxygen sats in the 60s momentarily, rebounded to the low 80s on 4L -Multifactorial, likely because of undiagnosed COPD, fluid overload and decreased respiratory drive secondary to benzos and narcotics. -Cardiomegaly and pleural effusion noted on xray 4/17 -Critical Care Consultation appreciated. -X-ray showed new pulmonary effusion, I will give 1 dose of Lasix.  Hypertension - vacillating SBP 130s to 90s  -No meds outpt, monitor and treat accordingly  -Currently  hypotensive  ETOH abuse -Reported recent ETOH intake, counseled to quit drinking.  -ETOH <11 on 05/06/13 -Monitor for possible withdrawal.  CIWA protocol ordered.  GERD (gastroesophageal reflux disease)  -Continue PPI therapy   Hypokalemia/Hyperkalemia  -Replaced K, d/t GI losses  -Initially on KCl PO x1 05/06/13, now hyperkalemic (from 3.2 4/16 to 5.6  4/17). -Monitor for symptoms, recheck BMP, Kayexalate given x 1 dose  Leukocytosis, unspecified  -likely d/t inflammation -urine culture pending, monitor    Code Status: Full Family Communication: No one at bedside Disposition Plan: Transferred to [facility] unit   Consultants:  Critical Care  Antibiotics:  None at this time    Objective: Filed Vitals:   05/08/13 0700  BP: 157/77  Pulse: 102  Temp: 98.8 F (37.1 C)  Resp: 29    Intake/Output Summary (Last 24 hours) at 05/08/13 0909 Last data filed at 05/08/13 0542  Gross per 24 hour  Intake      0 ml  Output      8 ml  Net     -8 ml   Filed Weights   05/06/13 0400  Weight: 63.504 kg (140 lb)    Exam:  Physical Exam  Constitutional:  Unconscious, across the bed  HENT:  Head: Normocephalic and atraumatic.  Eyes: Right pupil is not reactive. Left pupil is not reactive.  Pupils were ~size 2 and nonreactive to light  Cardiovascular: Normal heart sounds and intact distal pulses.   Respiratory: Bradypnea noted. She has decreased breath sounds.  Respirations were shallow and slow, minor expiratory wheezing noted  GI: She exhibits no distension and no mass.  Post Narcan pt c/o abd tenderness to palpation: LUQ  and epigastric region  Neurological: She is unresponsive. GCS eye subscore is 1. GCS verbal subscore is 1. GCS motor subscore is 1.  Initially pt was unable to be awakened but with further stimulation she would startle awake then fall back into a stupor.  Skin: There is cyanosis. There is pallor. Nails show clubbing.  Hands were cool, pale/cyanotic     Data Reviewed: Basic Metabolic Panel:  Recent Labs Lab 05/06/13 0424 05/07/13 0705 05/07/13 1800 05/08/13 0436  NA 145 146 145 145  K 3.2* 5.6* 4.2 3.7  CL 103 110 109 106  CO2 20 20 19 25   GLUCOSE 113* 90 91 91  BUN 9 13 13 11   CREATININE 1.07 0.96 0.93 0.92  CALCIUM 9.0 8.3* 9.0 8.6   Liver Function Tests:  Recent Labs Lab 05/06/13 0424 05/08/13 0436  AST 41* 36  ALT 31 19  ALKPHOS 113 92  BILITOT 0.2* 0.4  PROT 7.4 6.7  ALBUMIN 3.8 2.9*    Recent Labs Lab 05/06/13 0424 05/07/13 0705 05/08/13 0436  LIPASE 2791* 702* 105*   CBC:  Recent Labs Lab 05/06/13 0424 05/07/13 0955 05/08/13 0436  WBC 13.4* 20.1* 15.1*  NEUTROABS 11.2*  --   --   HGB 16.7* 14.7 14.9  HCT 48.2* 46.0 45.9  MCV 97.0 104.1* 103.1*  PLT 282 163 154    Studies: US Abdomen Complete  05/07/2013   CLINICAL DATA:  Pancreatitis  EXAM: ULTRASOUND ABDOMEN COMPLETE  COMPARISON:  DG ABD ACUTE W/CHEST dated 05/06/2013; CT ABD W/CM dated 03/23/2007; US ABDOMEN COMPLETE dated 03/24/2007  FINDINGS: Gallbladder:  No gallstones or wall thickening visualized. No sonographic Murphy sign noted.  Common bile duct:  Diameter: 4 mm  Liver:  Subtle focal hyperechogenicity along the gallbladder fossa, probably from focal fatty infiltration but conceivably a hemangioma. Within normal limits in parenchymal echogenicity.  IVC:  No abnormality visualized.  Pancreas:  Not well seen, possibly due to pancreatitis and/or overlying bowel gas.  Spleen:  Size and appearance within normal limits.  Right Kidney:  Length: 9.4 cm. Echogenicity within normal limits. No mass or hydronephrosis visualized.  Left Kidney:  Length: 10.2 cm. Echogenicity within normal limits. No mass or hydronephrosis visualized.  Abdominal aorta:  No aneurysm visualized.  Other findings:  Trace ascites along the right hepatic lobe margin. Small right pleural effusion.  IMPRESSION: 1. Poor visualization of the pancreas potentially due to pancreatitis  and/or overlying bowel gas. Trace ascites along the adjacent liver edge. 2. Gallbladder within normal limits. 3. Focal hyperechogenicity in the liver along the gallbladder fossa, probably from focal fatty infiltration, less likely a hemangioma.   Electronically Signed   By: Sherryl Barters M.D.   On: 05/07/2013 18:41   Dg Chest Port 1 View  05/07/2013   CLINICAL DATA:  Unresponsive, possible aspiration  EXAM: PORTABLE CHEST - 1 VIEW  COMPARISON:  DG ABD ACUTE W/CHEST dated 05/06/2013  FINDINGS: Heart size is mildly enlarged with prominence of the central vasculature but no overt edema. Curvilinear bilateral lower lobe probable atelectasis. Trace pleural effusions are noted. No new bony abnormality.  IMPRESSION: Lung volumes are lower than on the prior exam with curvilinear probable bibasilar atelectasis. Aspiration could appear similar although there is no new lobar consolidation identified.  Trace pleural effusions.   Electronically Signed   By: Conchita Paris M.D.   On: 05/07/2013 10:29    Scheduled Meds: . enoxaparin (LOVENOX) injection  40 mg Subcutaneous Q24H  . folic acid  1 mg  Oral Daily  . imipenem-cilastatin  500 mg Intravenous 3 times per day  . multivitamin with minerals  1 tablet Oral Daily  . pantoprazole (PROTONIX) IV  40 mg Intravenous Q12H  . thiamine  100 mg Oral Daily   Continuous Infusions:   Active Problems:   Pancreatitis   Hypertension   GERD (gastroesophageal reflux disease)   Hypokalemia   Leukocytosis, unspecified   Alcohol abuse   SIRS (systemic inflammatory response syndrome)    Time spent: 35 minutes  Verlee Monte, MD [phone]  [facility]  If 7PM-7AM, please contact night-coverage at [email or web address], password [facility] 05/08/2013, 9:09 AM  LOS: 2 days

## 2013-05-08 NOTE — Progress Notes (Signed)
  Echocardiogram 2D Echocardiogram has been performed.  [place] 05/08/2013, 5:43 PM

## 2013-05-09 ENCOUNTER — Encounter (HOSPITAL_COMMUNITY): Payer: Self-pay | Admitting: Cardiology

## 2013-05-09 DIAGNOSIS — I208 Other forms of angina pectoris: Secondary | ICD-10-CM

## 2013-05-09 DIAGNOSIS — I5021 Acute systolic (congestive) heart failure: Secondary | ICD-10-CM | POA: Diagnosis present

## 2013-05-09 DIAGNOSIS — Z8249 Family history of ischemic heart disease and other diseases of the circulatory system: Secondary | ICD-10-CM

## 2013-05-09 DIAGNOSIS — I509 Heart failure, unspecified: Secondary | ICD-10-CM

## 2013-05-09 DIAGNOSIS — Z720 Tobacco use: Secondary | ICD-10-CM | POA: Diagnosis present

## 2013-05-09 DIAGNOSIS — F17200 Nicotine dependence, unspecified, uncomplicated: Secondary | ICD-10-CM

## 2013-05-09 DIAGNOSIS — K859 Acute pancreatitis without necrosis or infection, unspecified: Principal | ICD-10-CM

## 2013-05-09 DIAGNOSIS — J9600 Acute respiratory failure, unspecified whether with hypoxia or hypercapnia: Secondary | ICD-10-CM | POA: Diagnosis present

## 2013-05-09 LAB — COMPREHENSIVE METABOLIC PANEL
ALK PHOS: 87 U/L (ref 39–117)
ALT: 16 U/L (ref 0–35)
AST: 34 U/L (ref 0–37)
Albumin: 2.5 g/dL — ABNORMAL LOW (ref 3.5–5.2)
BILIRUBIN TOTAL: 0.7 mg/dL (ref 0.3–1.2)
BUN: 14 mg/dL (ref 6–23)
CHLORIDE: 95 meq/L — AB (ref 96–112)
CO2: 20 meq/L (ref 19–32)
Calcium: 8.4 mg/dL (ref 8.4–10.5)
Creatinine, Ser: 0.83 mg/dL (ref 0.50–1.10)
GFR calc Af Amer: 88 mL/min — ABNORMAL LOW (ref >90)
GFR calc non Af Amer: 76 mL/min — ABNORMAL LOW (ref >90)
Glucose, Bld: 72 mg/dL (ref 70–99)
Potassium: 3.2 mEq/L — ABNORMAL LOW (ref 3.7–5.3)
Sodium: 135 mEq/L — ABNORMAL LOW (ref 137–147)
Total Protein: 6.6 g/dL (ref 6.0–8.3)

## 2013-05-09 LAB — CBC
HCT: 46.8 % — ABNORMAL HIGH (ref 36.0–46.0)
Hemoglobin: 15.9 g/dL — ABNORMAL HIGH (ref 12.0–15.0)
MCH: 33.4 pg (ref 26.0–34.0)
MCHC: 34 g/dL (ref 30.0–36.0)
MCV: 98.3 fL (ref 78.0–100.0)
PLATELETS: ADEQUATE 10*3/uL (ref 150–400)
RBC: 4.76 MIL/uL (ref 3.87–5.11)
RDW: 13.4 % (ref 11.5–15.5)
WBC: 9.7 10*3/uL (ref 4.0–10.5)

## 2013-05-09 LAB — PRO B NATRIURETIC PEPTIDE: PRO B NATRI PEPTIDE: 8584 pg/mL — AB (ref 0–125)

## 2013-05-09 MED ORDER — SODIUM CHLORIDE 0.9 % IV SOLN
INTRAVENOUS | Status: DC
  Administered 2013-05-10: 06:00:00 via INTRAVENOUS

## 2013-05-09 MED ORDER — ASPIRIN 81 MG PO CHEW
81.0000 mg | CHEWABLE_TABLET | ORAL | Status: AC
  Administered 2013-05-10: 81 mg via ORAL
  Filled 2013-05-09: qty 1

## 2013-05-09 MED ORDER — POTASSIUM CHLORIDE CRYS ER 20 MEQ PO TBCR
60.0000 meq | EXTENDED_RELEASE_TABLET | Freq: Four times a day (QID) | ORAL | Status: AC
  Administered 2013-05-09 (×2): 60 meq via ORAL
  Filled 2013-05-09 (×2): qty 3

## 2013-05-09 MED ORDER — SODIUM CHLORIDE 0.9 % IV SOLN: 250.0000 mL | INTRAVENOUS | Status: DC | PRN

## 2013-05-09 MED ORDER — FUROSEMIDE 10 MG/ML IJ SOLN
40.0000 mg | Freq: Two times a day (BID) | INTRAMUSCULAR | Status: DC
  Administered 2013-05-09: 40 mg via INTRAVENOUS
  Filled 2013-05-09: qty 4

## 2013-05-09 MED ORDER — SODIUM CHLORIDE 0.9 % IJ SOLN: 3.0000 mL | INTRAMUSCULAR | Status: DC | PRN

## 2013-05-09 MED ORDER — FUROSEMIDE 10 MG/ML IJ SOLN
40.0000 mg | Freq: Every day | INTRAMUSCULAR | Status: DC
  Administered 2013-05-11: 10:00:00 40 mg via INTRAVENOUS
  Filled 2013-05-09 (×3): qty 4

## 2013-05-09 MED ORDER — MAGNESIUM SULFATE 40 MG/ML IJ SOLN
2.0000 g | Freq: Once | INTRAMUSCULAR | Status: AC
  Administered 2013-05-09: 2 g via INTRAVENOUS
  Filled 2013-05-09: qty 50

## 2013-05-09 MED ORDER — FUROSEMIDE 10 MG/ML IJ SOLN
40.0000 mg | Freq: Three times a day (TID) | INTRAMUSCULAR | Status: DC
  Administered 2013-05-09: 40 mg via INTRAVENOUS
  Filled 2013-05-09: qty 4

## 2013-05-09 MED ORDER — CARVEDILOL 3.125 MG PO TABS
3.1250 mg | ORAL_TABLET | Freq: Two times a day (BID) | ORAL | Status: DC
  Filled 2013-05-09 (×6): qty 1

## 2013-05-09 MED ORDER — LISINOPRIL 5 MG PO TABS
5.0000 mg | ORAL_TABLET | Freq: Every day | ORAL | Status: DC
  Administered 2013-05-09 – 2013-05-12 (×4): 5 mg via ORAL
  Filled 2013-05-09 (×5): qty 1

## 2013-05-09 MED ORDER — ASPIRIN 81 MG PO CHEW
81.0000 mg | CHEWABLE_TABLET | Freq: Every day | ORAL | Status: DC
  Administered 2013-05-09 – 2013-05-15 (×7): 81 mg via ORAL
  Filled 2013-05-09 (×7): qty 1

## 2013-05-09 MED ORDER — SODIUM CHLORIDE 0.9 % IJ SOLN
3.0000 mL | Freq: Two times a day (BID) | INTRAMUSCULAR | Status: DC
  Administered 2013-05-10: 3 mL via INTRAVENOUS

## 2013-05-09 MED ORDER — CARVEDILOL 6.25 MG PO TABS
6.2500 mg | ORAL_TABLET | Freq: Two times a day (BID) | ORAL | Status: DC
  Administered 2013-05-09 – 2013-05-11 (×5): 6.25 mg via ORAL
  Filled 2013-05-09 (×6): qty 1
  Filled 2013-05-09: qty 2
  Filled 2013-05-09 (×3): qty 1

## 2013-05-09 MED ORDER — ATORVASTATIN CALCIUM 40 MG PO TABS
40.0000 mg | ORAL_TABLET | Freq: Every day | ORAL | Status: DC
  Administered 2013-05-09 – 2013-05-14 (×6): 40 mg via ORAL
  Filled 2013-05-09 (×8): qty 1

## 2013-05-09 MED ORDER — CARVEDILOL 3.125 MG PO TABS
3.1250 mg | ORAL_TABLET | Freq: Two times a day (BID) | ORAL | Status: DC
  Filled 2013-05-09 (×2): qty 1

## 2013-05-09 NOTE — Plan of Care (Signed)
RN called re: hypotension after combo Lasix and Coreg- CXR reviewed and no significant CHF so will add hold parameters for Coreg and decrease Lasix to daily until can be re-evaluated in am.  Erin Hearing, ANP

## 2013-05-09 NOTE — Progress Notes (Signed)
[facility] PROGRESS NOTE  [name] WUJ:[id number] DOB: March 26, 1955 DOA: 05/06/2013 PCP: No PCP Per Patient  HPI [name] is a 58 y.o. female with PMH significant for pancreatitis, alcohol abuse/binge drinking and other medical problems as listed below who presented to the ED with abd pain and N/V. She stated that she began having severe abd pain with associated N/V at about 12:30 AM 05/06/13. The emesis was non bloody and she states that pain was upper abdominal-more on L. side. She related that her last drink was Tues and that she usually drinks liquor but states she cannot quantify. In the ED her alcohol level was <11, lipase in 2700s and k,3.2 . She was treated with multiple doses of analgesics and was admitted for further eval and management.  Subjective: Feels and looks much better today, received IV Lasix yesterday. 2-D echo showed ejection fraction of 20-25%.  Assessment/Plan:  Principal Problem:   Acute pancreatitis Active Problems:   Hypertension   GERD (gastroesophageal reflux disease)   Hypokalemia   Leukocytosis, unspecified   Alcohol abuse   SIRS (systemic inflammatory response syndrome)   Acute systolic CHF (congestive heart failure)   Acute Pancreatitis with previous history of same. -Secondary to alcohol (Lipase initially 2791-now 105, abd pain, N/V) -NPO, antiemetics, pain management with Dilaudid (decreased dose). -counseled to quit alcohol in ED -Continues to have abdominal pain  Acute systolic CHF -2-D echocardiogram showed LVEF of 20-25%. -Chest x-ray showed vascular congestion and no small pleural effusion. -Started on IV Lasix, Coreg, check chest x-ray in a.m. -Cardiology to evaluate for new onset CHF.  Acute Hypoxic Respiratory Failure -Oxygen sats in the 60s momentarily, rebounded to the low 80s on 4L -Multifactorial, likely because of undiagnosed COPD, fluid overload and decreased respiratory drive secondary to benzos and  narcotics. -Cardiomegaly and pleural effusion noted on xray 4/17 -Critical Care Consultation appreciated. -Acute CHF is probably playing a big role on her respiratory failure.  Hypertension - vacillating SBP 130s to 90s  -No meds outpt, monitor and treat accordingly  -Currently hypotensive  ETOH abuse -Reported recent ETOH intake, counseled to quit drinking.  -ETOH <11 on 05/06/13 -Monitor for possible withdrawal.  CIWA protocol ordered.  GERD (gastroesophageal reflux disease)  -Continue PPI therapy   Hypokalemia/Hyperkalemia  -Replaced K, d/t GI losses  -Initially on KCl PO x1 05/06/13, now hyperkalemic (from 3.2 4/16 to 5.6  4/17). -Monitor for symptoms, recheck BMP, Kayexalate given x 1 dose  Leukocytosis, unspecified  -likely d/t inflammation -urine culture pending, monitor    Code Status: Full Family Communication: No one at bedside Disposition Plan: Transferred to [facility] unit   Consultants:  Critical Care.  Cardiology  Antibiotics:  None at this time    Objective: Filed Vitals:   05/09/13 0800  BP: 130/101  Pulse: 96  Temp:   Resp: 27    Intake/Output Summary (Last 24 hours) at 05/09/13 1133 Last data filed at 05/09/13 0746  Gross per 24 hour  Intake      0 ml  Output    950 ml  Net   -950 ml   Filed Weights   05/06/13 0400 05/09/13 0426  Weight: 63.504 kg (140 lb) 65.6 kg (144 lb 10 oz)    Exam:  Physical Exam  Constitutional:  Unconscious, across the bed  HENT:  Head: Normocephalic and atraumatic.  Eyes: Right pupil is not reactive. Left pupil is not reactive.  Pupils were ~size 2 and nonreactive to light  Cardiovascular: Normal heart sounds and  intact distal pulses.   Respiratory: Bradypnea noted. She has decreased breath sounds.  Respirations were shallow and slow, minor expiratory wheezing noted  GI: She exhibits no distension and no mass.  Post Narcan pt c/o abd tenderness to palpation: LUQ and epigastric region   Neurological: She is unresponsive. GCS eye subscore is 1. GCS verbal subscore is 1. GCS motor subscore is 1.  Initially pt was unable to be awakened but with further stimulation she would startle awake then fall back into a stupor.  Skin: There is cyanosis. There is pallor. Nails show clubbing.  Hands were cool, pale/cyanotic    Data Reviewed: Basic Metabolic Panel:  Recent Labs Lab 05/06/13 0424 05/07/13 0705 05/07/13 1800 05/08/13 0436 05/09/13 0340  NA 145 146 145 145 135*  K 3.2* 5.6* 4.2 3.7 3.2*  CL 103 110 109 106 95*  CO2 20 20 19 25 20   GLUCOSE 113* 90 91 91 72  BUN 9 13 13 11 14   CREATININE 1.07 0.96 0.93 0.92 0.83  CALCIUM 9.0 8.3* 9.0 8.6 8.4   Liver Function Tests:  Recent Labs Lab 05/06/13 0424 05/08/13 0436 05/09/13 0340  AST 41* 36 34  ALT 31 19 16   ALKPHOS 113 92 87  BILITOT 0.2* 0.4 0.7  PROT 7.4 6.7 6.6  ALBUMIN 3.8 2.9* 2.5*    Recent Labs Lab 05/06/13 0424 05/07/13 0705 05/08/13 0436  LIPASE 2791* 702* 105*   CBC:  Recent Labs Lab 05/06/13 0424 05/07/13 0955 05/08/13 0436 05/09/13 0340  WBC 13.4* 20.1* 15.1* 9.7  NEUTROABS 11.2*  --   --   --   HGB 16.7* 14.7 14.9 15.9*  HCT 48.2* 46.0 45.9 46.8*  MCV 97.0 104.1* 103.1* 98.3  PLT 282 163 154 PLATELET CLUMPS NOTED ON SMEAR, COUNT APPEARS ADEQUATE    Studies: US Abdomen Complete  05/07/2013   CLINICAL DATA:  Pancreatitis  EXAM: ULTRASOUND ABDOMEN COMPLETE  COMPARISON:  DG ABD ACUTE W/CHEST dated 05/06/2013; CT ABD W/CM dated 03/23/2007; US ABDOMEN COMPLETE dated 03/24/2007  FINDINGS: Gallbladder:  No gallstones or wall thickening visualized. No sonographic Murphy sign noted.  Common bile duct:  Diameter: 4 mm  Liver:  Subtle focal hyperechogenicity along the gallbladder fossa, probably from focal fatty infiltration but conceivably a hemangioma. Within normal limits in parenchymal echogenicity.  IVC:  No abnormality visualized.  Pancreas:  Not well seen, possibly due to pancreatitis  and/or overlying bowel gas.  Spleen:  Size and appearance within normal limits.  Right Kidney:  Length: 9.4 cm. Echogenicity within normal limits. No mass or hydronephrosis visualized.  Left Kidney:  Length: 10.2 cm. Echogenicity within normal limits. No mass or hydronephrosis visualized.  Abdominal aorta:  No aneurysm visualized.  Other findings:  Trace ascites along the right hepatic lobe margin. Small right pleural effusion.  IMPRESSION: 1. Poor visualization of the pancreas potentially due to pancreatitis and/or overlying bowel gas. Trace ascites along the adjacent liver edge. 2. Gallbladder within normal limits. 3. Focal hyperechogenicity in the liver along the gallbladder fossa, probably from focal fatty infiltration, less likely a hemangioma.   Electronically Signed   By: Sherryl Barters M.D.   On: 05/07/2013 18:41    Scheduled Meds: . carvedilol  3.125 mg Oral BID WC  . enoxaparin (LOVENOX) injection  40 mg Subcutaneous Q24H  . folic acid  1 mg Oral Daily  . furosemide  40 mg Intravenous BID  . imipenem-cilastatin  250 mg Intravenous 4 times per day  . multivitamin with minerals  1 tablet Oral Daily  . pantoprazole (PROTONIX) IV  40 mg Intravenous Q12H  . potassium chloride  60 mEq Oral Q6H  . thiamine  100 mg Oral Daily   Continuous Infusions:   Principal Problem:   Acute pancreatitis Active Problems:   Hypertension   GERD (gastroesophageal reflux disease)   Hypokalemia   Leukocytosis, unspecified   Alcohol abuse   SIRS (systemic inflammatory response syndrome)   Acute systolic CHF (congestive heart failure)    Time spent: 35 minutes  Verlee Monte, MD [phone]  [facility]  If 7PM-7AM, please contact night-coverage at [email or web address], password [facility] 05/09/2013, 11:33 AM  LOS: 3 days

## 2013-05-09 NOTE — Progress Notes (Signed)
PULMONARY / CRITICAL CARE MEDICINE   Name: [name] MRN: [id number] DOB: [birth year]    ADMISSION DATE:  05/06/2013 CONSULTATION DATE:  05/07/13  REFERRING MD :  Dr. Hartford Poli PRIMARY SERVICE: TR[facility]  CHIEF COMPLAINT:  Pancreatitis   BRIEF PATIENT DESCRIPTION: 58 y/o F with PMH of ETOH abuse / binge drinking admitted on 4/16 with abdominal pain, nausea / vomiting.     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   Her last drink was on 4/14 - she relays that she has tried to decrease amt and frequency of drinking.  She does not drink on a daily basis.  On admission, her lipase was > 2700 and AST of 41.  She was admitted to medical floor per [facility].  She was made NPO, treated with antiemetics, and narcotics for abd pain.  On am rounds 4/17 she was noted to have altered mental status with shallow respirations after dilaudid administration.  She was given Narcan with increased responsiveness.  PCCM consulted for evaluation. Pt continues to complain of abdominal pain and n/v.      SUBJECTIVE: Thirsty, denies dyspnea.  VITAL SIGNS: Temp:  [98 F (36.7 C)-98.8 F (37.1 C)] 98.1 F (36.7 C) (04/19 0700) Pulse Rate:  [25-108] 96 (04/19 0700) Resp:  [20-36] 27 (04/19 0700) BP: (129-169)/(81-130) 143/101 mmHg (04/19 0700) SpO2:  [89 %-99 %] 90 % (04/19 0426) Weight:  [144 lb 10 oz (65.6 kg)] 144 lb 10 oz (65.6 kg) (04/19 0426)  HEMODYNAMICS:    VENTILATOR SETTINGS:    INTAKE / OUTPUT: Intake/Output     04/18 0701 - 04/19 0700 04/19 0701 - 04/20 0700   IV Piggyback 100    Total Intake(mL/kg) 100 (1.5)    Urine (mL/kg/hr) 801 (0.5) 150 (1)   Stool      Total Output 801 150   Net -701 -150        Urine Occurrence 6 x      PHYSICAL EXAMINATION: General:  Chronically ill , NAD, asks drink Neuro:  Not very communicative, but awake/ alert , speech clear, MAE HEENT:  Mm pink, no jvd Cardiovascular:  s1s2 rrr, no m/r/g Lungs: No wheeze, no cough, unlabored Abdomen:  Round/firm but not rigid, no rebound tenderness, bsx4 active, midline abd scar Musculoskeletal:  No acute deformities Skin:  Psoriatic lesions on knees / hands, telangectasia's on chest wall   LABS:  CBC  Recent Labs Lab 05/07/13 0955 05/08/13 0436 05/09/13 0340  WBC 20.1* 15.1* 9.7  HGB 14.7 14.9 15.9*  HCT 46.0 45.9 46.8*  PLT 163 154 PLATELET CLUMPS NOTED ON SMEAR, COUNT APPEARS ADEQUATE   Coag's No results found for this basename: APTT, INR,  in the last 168 hours BMET  Recent Labs Lab 05/07/13 1800 05/08/13 0436 05/09/13 0340  NA 145 145 135*  K 4.2 3.7 3.2*  CL 109 106 95*  CO2 19 25 20   BUN 13 11 14   CREATININE 0.93 0.92 0.83  GLUCOSE 91 91 72   Electrolytes  Recent Labs Lab 05/07/13 1800 05/08/13 0436 05/09/13 0340  CALCIUM 9.0 8.6 8.4   Sepsis Markers  Recent Labs  Lab 05/06/13 0447  LATICACIDVEN 1.3   ABG  Recent Labs Lab 05/07/13 0905 05/08/13 1000  PHART 7.312* 7.425  PCO2ART 41.3 39.3  PO2ART 79.2* 55.5*   Liver Enzymes  Recent Labs Lab 05/06/13 0424 05/08/13 0436 05/09/13 0340  AST 41* 36 34  ALT 31 19 16   ALKPHOS 113 92 87  BILITOT 0.2* 0.4 0.7  ALBUMIN 3.8 2.9* 2.5*   Cardiac Enzymes  Recent Labs Lab 05/09/13 0340  PROBNP 8584.0*   Glucose No results found for this basename: GLUCAP,  in the last 168 hours  Imaging US Abdomen Complete  05/07/2013   CLINICAL DATA:  Pancreatitis  EXAM: ULTRASOUND ABDOMEN COMPLETE  COMPARISON:  DG ABD ACUTE W/CHEST dated 05/06/2013; CT ABD W/CM dated 03/23/2007; US ABDOMEN COMPLETE dated 03/24/2007  FINDINGS: Gallbladder:  No gallstones or wall thickening visualized. No  sonographic Murphy sign noted.  Common bile duct:  Diameter: 4 mm  Liver:  Subtle focal hyperechogenicity along the gallbladder fossa, probably from focal fatty infiltration but conceivably a hemangioma. Within normal limits in parenchymal echogenicity.  IVC:  No abnormality visualized.  Pancreas:  Not well seen, possibly due to pancreatitis and/or overlying bowel gas.  Spleen:  Size and appearance within normal limits.  Right Kidney:  Length: 9.4 cm. Echogenicity within normal limits. No mass or hydronephrosis visualized.  Left Kidney:  Length: 10.2 cm. Echogenicity within normal limits. No mass or hydronephrosis visualized.  Abdominal aorta:  No aneurysm visualized.  Other findings:  Trace ascites along the right hepatic lobe margin. Small right pleural effusion.  IMPRESSION: 1. Poor visualization of the pancreas potentially due to pancreatitis and/or overlying bowel gas. Trace ascites along the adjacent liver edge. 2. Gallbladder within normal limits. 3. Focal hyperechogenicity in the liver along the gallbladder fossa, probably from focal fatty infiltration, less likely a hemangioma.   Electronically Signed   By: Sherryl Barters M.D.   On: 05/07/2013 18:41   Dg Chest Port 1 View  05/07/2013   CLINICAL DATA:  Unresponsive, possible aspiration  EXAM: PORTABLE CHEST - 1 VIEW  COMPARISON:  DG ABD ACUTE W/CHEST dated 05/06/2013  FINDINGS: Heart size is mildly enlarged with prominence of the central vasculature but no overt edema. Curvilinear bilateral lower lobe probable atelectasis. Trace pleural effusions are noted. No new bony abnormality.  IMPRESSION: Lung volumes are lower than on the prior exam with curvilinear probable bibasilar atelectasis. Aspiration could appear similar although there is no new lobar consolidation identified.  Trace pleural effusions.   Electronically Signed   By: Conchita Paris M.D.   On: 05/07/2013 10:29    ASSESSMENT / PLAN:  INFECTIOUS A:   Leukocytosis  Pancreatitis  P:    -abx as above -follow cultures -see GI  GASTROINTESTINAL A:   Pancreatitis  Suspected Cirrhosis Nausea  GERD Hx Diverticulitis with Ostomy Reversal  P:   -consider CT ABD if symptoms / biomarkers worsen -trend LFT's -ETOH cessation    NEUROLOGIC A:   ETOH Abuse - last drink tues 4/14 Pain  Anxiety ? DT's Hx CVA - residual L sided weakness but able to perform ADL's.  Does not drive. Much more lucid today  P:   -caution with dilaudid administration, episode of AMS after administration  -CIWA protocol  -PRN Ativan  -folate / MVI / thiamine   PULMONARY A: Trace Bilateral Pleural Effusions Tobacco Abuse  P:   -trend CXR> 4/20 -smoking cessation  - Xopenex neb prn-Watch I&O  CARDIOVASCULAR A:  HTN HLD P:  -monitor  BP trend   RENAL A:   Hyperkalemia Limited output Note replacement started 4/19 P:   -trend BMP   HEMATOLOGIC A:   Macrocytic Anemia  Thrombocytopenia  P:  -monitor CBC -folate / mvi / thiamine as above   ENDOCRINE A:   Hyperglycemia   P:   -mild, monitor glucose on bmp      CD Saige Busby, MD ; [facility] service Mobile [phone].  After 5:30 PM or weekends, call [phone]    05/09/2013, 9:21 AM

## 2013-05-09 NOTE — Progress Notes (Signed)
Right forearm PIV infiltrated. Pt experiencing pain, redness and puffiness at site. KVO IV fluids stopped and capped off. Right hand PIV not flushing successfully.  IV therapy team paged for new access IV, awaiting response. No IV access at moment.

## 2013-05-09 NOTE — Consult Note (Signed)
Reason for Consult: Severely Reduced Systolic Function of Unknown Etiology  Referring Physician: [place]  PCP: None Cardiologist: New  HPI: The patient is a 58 y/o female, with no prior cardiac history, who was admitted by [facility] on 05/06/13 for pancreatitis. Her w/u included a 2D echo which demonstrated severely reduced systolic function with an EF of 20-25%. Diffuse hypokinesis with akinesis of the basal inferior wall was noted. Doppler parameters were consistent with abnormal left ventricular relaxation (grade 1 diastolic dysfunction). We were consulted for further management.   She has no prior cardiac history, but she has multiple risk factors for coronary disease. She has a strong family h/o of premature CAD. Her father suffered a MI at age 13. She has a 30+ year h/o tobacco abuse, smoking on average 1ppd. She has h/o untreated HTN and HLD. She has not been followed by a physician in over 2 years and has been off of all medications since that time. She is a former patient of the [facility], but did not established care elsewhere after they closed. She has no insurance. She had a CVA in 2009 which left her with residual left sided weakness. She  also has a h/o chronic alcohol abuse.   She notes chest pain symptoms x 2 months that are concerning for angina. She notes substernal chest pressure/ heaviness that occurs both at rest and with exertion. Exacerbated by cold temperatures. She also notes recent history of DOE, orthopnea and PND.    Past Medical History   Diagnosis  Date   .  Diverticulosis    .  Pancreatitis    .  Hypertension    .  Stroke    .  Hyperlipidemia    .  Shortness of breath    .  Anxiety    .  GERD (gastroesophageal reflux disease)    .  ETOH abuse      does not drink every day    Past Surgical History   Procedure  Laterality  Date   .  Colostomy     .  Ileostomy     .  Colostomy takedown      History reviewed. Family History: CAD- father MI at age  109 Social History: reports that she has been smoking Cigarettes. She has a 40 pack-year smoking history. She has never used smokeless tobacco. She reports that she drinks alcohol. She reports that she does not use illicit drugs.  Allergies:  Allergies   Allergen  Reactions   .  Penicillins  Rash    Medications:  Prior to Admission:  No prescriptions prior to admission    Results for orders placed during the hospital encounter of 05/06/13 (from the past 48 hour(s))   URINE CULTURE Status: None    Collection Time    05/07/13 3:03 PM   Result  Value  Ref Range    Specimen Description  URINE, CLEAN CATCH     Special Requests  NONE     Culture Setup Time      Value:  05/07/2013 19:18     Performed at [place]      Value:  75,000 COLONIES/ML     Performed at [facility]    Culture      Value:  Multiple bacterial morphotypes present, none predominant. Suggest appropriate recollection if clinically indicated.     Performed at [facility]    Report Status  05/08/2013 FINAL  BASIC METABOLIC PANEL Status: Abnormal    Collection Time    05/07/13 6:00 PM   Result  Value  Ref Range    Sodium  145  137 - 147 mEq/L    Potassium  4.2  3.7 - 5.3 mEq/L    Chloride  109  96 - 112 mEq/L    CO2  19  19 - 32 mEq/L    Glucose, Bld  91  70 - 99 mg/dL    BUN  13  6 - 23 mg/dL    Creatinine, Ser  0.93  0.50 - 1.10 mg/dL    Calcium  9.0  8.4 - 10.5 mg/dL    GFR calc non Af Amer  66 (*)  >90 mL/min    GFR calc Af Amer  77 (*)  >90 mL/min    Comment:  (NOTE)     The eGFR has been calculated using the CKD EPI equation.     This calculation has not been validated in all clinical situations.     eGFR's persistently <90 mL/min signify possible Chronic Kidney     Disease.   CBC Status: Abnormal    Collection Time    05/08/13 4:36 AM   Result  Value  Ref Range    WBC  15.1 (*)  4.0 - 10.5 K/uL    RBC  4.45  3.87 - 5.11 MIL/uL    Hemoglobin  14.9  12.0  - 15.0 g/dL    HCT  45.9  36.0 - 46.0 %    MCV  103.1 (*)  78.0 - 100.0 fL    MCH  33.5  26.0 - 34.0 pg    MCHC  32.5  30.0 - 36.0 g/dL    RDW  13.9  11.5 - 15.5 %    Platelets  154  150 - 400 K/uL   COMPREHENSIVE METABOLIC PANEL Status: Abnormal    Collection Time    05/08/13 4:36 AM   Result  Value  Ref Range    Sodium  145  137 - 147 mEq/L    Potassium  3.7  3.7 - 5.3 mEq/L    Chloride  106  96 - 112 mEq/L    CO2  25  19 - 32 mEq/L    Glucose, Bld  91  70 - 99 mg/dL    BUN  11  6 - 23 mg/dL    Creatinine, Ser  0.92  0.50 - 1.10 mg/dL    Calcium  8.6  8.4 - 10.5 mg/dL    Total Protein  6.7  6.0 - 8.3 g/dL    Albumin  2.9 (*)  3.5 - 5.2 g/dL    AST  36  0 - 37 U/L    ALT  19  0 - 35 U/L    Alkaline Phosphatase  92  39 - 117 U/L    Total Bilirubin  0.4  0.3 - 1.2 mg/dL    GFR calc non Af Amer  67 (*)  >90 mL/min    GFR calc Af Amer  78 (*)  >90 mL/min    Comment:  (NOTE)     The eGFR has been calculated using the CKD EPI equation.     This calculation has not been validated in all clinical situations.     eGFR's persistently <90 mL/min signify possible Chronic Kidney     Disease.   LIPASE, BLOOD Status: Abnormal    Collection Time    05/08/13 4:36 AM  Result  Value  Ref Range    Lipase  105 (*)  11 - 59 U/L   BLOOD GAS, ARTERIAL Status: Abnormal    Collection Time    05/08/13 10:00 AM   Result  Value  Ref Range    O2 Content  3.0     Delivery systems  NASAL CANNULA     pH, Arterial  7.425  7.350 - 7.450    pCO2 arterial  39.3  35.0 - 45.0 mmHg    pO2, Arterial  55.5 (*)  80.0 - 100.0 mmHg    Bicarbonate  25.3 (*)  20.0 - 24.0 mEq/L    TCO2  26.5  0 - 100 mmol/L    Acid-Base Excess  1.4  0.0 - 2.0 mmol/L    O2 Saturation  89.7     Patient temperature  98.6     Collection site  LEFT RADIAL     Drawn by  [id number]     Sample type  ARTERIAL DRAW     Allens test (pass/fail)  PASS  PASS   PRO B NATRIURETIC PEPTIDE Status: Abnormal    Collection Time    05/09/13 3:40 AM    Result  Value  Ref Range    Pro B Natriuretic peptide (BNP)  8584.0 (*)  0 - 125 pg/mL   COMPREHENSIVE METABOLIC PANEL Status: Abnormal    Collection Time    05/09/13 3:40 AM   Result  Value  Ref Range    Sodium  135 (*)  137 - 147 mEq/L    Potassium  3.2 (*)  3.7 - 5.3 mEq/L    Chloride  95 (*)  96 - 112 mEq/L    CO2  20  19 - 32 mEq/L    Glucose, Bld  72  70 - 99 mg/dL    BUN  14  6 - 23 mg/dL    Creatinine, Ser  0.83  0.50 - 1.10 mg/dL    Calcium  8.4  8.4 - 10.5 mg/dL    Total Protein  6.6  6.0 - 8.3 g/dL    Albumin  2.5 (*)  3.5 - 5.2 g/dL    AST  34  0 - 37 U/L    Comment:  HEMOLYSIS AT THIS LEVEL MAY AFFECT RESULT    ALT  16  0 - 35 U/L    Alkaline Phosphatase  87  39 - 117 U/L    Total Bilirubin  0.7  0.3 - 1.2 mg/dL    GFR calc non Af Amer  76 (*)  >90 mL/min    GFR calc Af Amer  88 (*)  >90 mL/min    Comment:  (NOTE)     The eGFR has been calculated using the CKD EPI equation.     This calculation has not been validated in all clinical situations.     eGFR's persistently <90 mL/min signify possible Chronic Kidney     Disease.   CBC Status: Abnormal    Collection Time    05/09/13 3:40 AM   Result  Value  Ref Range    WBC  9.7  4.0 - 10.5 K/uL    Comment:  WHITE COUNT CONFIRMED ON SMEAR    RBC  4.76  3.87 - 5.11 MIL/uL    Hemoglobin  15.9 (*)  12.0 - 15.0 g/dL    HCT  46.8 (*)  36.0 - 46.0 %    MCV  98.3  78.0 - 100.0 fL  MCH  33.4  26.0 - 34.0 pg    MCHC  34.0  30.0 - 36.0 g/dL    RDW  13.4  11.5 - 15.5 %    Platelets   150 - 400 K/uL    Value:  PLATELET CLUMPS NOTED ON SMEAR, COUNT APPEARS ADEQUATE    US Abdomen Complete  05/07/2013 CLINICAL DATA: Pancreatitis EXAM: ULTRASOUND ABDOMEN COMPLETE COMPARISON: DG ABD ACUTE W/CHEST dated 05/06/2013; CT ABD W/CM dated 03/23/2007; US ABDOMEN COMPLETE dated 03/24/2007 FINDINGS: Gallbladder: No gallstones or wall thickening visualized. No sonographic Murphy sign noted. Common bile duct: Diameter: 4 mm Liver: Subtle focal  hyperechogenicity along the gallbladder fossa, probably from focal fatty infiltration but conceivably a hemangioma. Within normal limits in parenchymal echogenicity. IVC: No abnormality visualized. Pancreas: Not well seen, possibly due to pancreatitis and/or overlying bowel gas. Spleen: Size and appearance within normal limits. Right Kidney: Length: 9.4 cm. Echogenicity within normal limits. No mass or hydronephrosis visualized. Left Kidney: Length: 10.2 cm. Echogenicity within normal limits. No mass or hydronephrosis visualized. Abdominal aorta: No aneurysm visualized. Other findings: Trace ascites along the right hepatic lobe margin. Small right pleural effusion. IMPRESSION: 1. Poor visualization of the pancreas potentially due to pancreatitis and/or overlying bowel gas. Trace ascites along the adjacent liver edge. 2. Gallbladder within normal limits. 3. Focal hyperechogenicity in the liver along the gallbladder fossa, probably from focal fatty infiltration, less likely a hemangioma. Electronically Signed By: Sherryl Barters M.D. On: 05/07/2013 18:41   Review of Systems  Respiratory: Positive for shortness of breath.  Cardiovascular: Positive for chest pain, orthopnea and PND. Negative for leg swelling.  Gastrointestinal: Positive for nausea, vomiting and diarrhea.  All other systems reviewed and are negative.   Blood pressure 130/101, pulse 96, temperature 97.9 F (36.6 C), temperature source Oral, resp. rate 27, height $RemoveBe'5\' 3"'EqWabfcLb$  (1.6 m), weight 144 lb 10 oz (65.6 kg), SpO2 90.00%.  Physical Exam  Constitutional: She is oriented to person, place, and time. She appears well-developed and well-nourished. No distress.  Dentition in poor repair  Neck: No JVD present.  Cardiovascular: Normal rate, regular rhythm, normal heart sounds and intact distal pulses. Exam reveals no gallop and no friction rub.  No murmur heard.  Pulses:  Radial pulses are 2+ on the right side, and 2+ on the left side.  Dorsalis  pedis pulses are 2+ on the right side, and 2+ on the left side.  Respiratory: Effort normal and breath sounds normal. No respiratory distress. She has no wheezes. She has no rales.  GI: Soft. Bowel sounds are normal. She exhibits no distension and no mass. There is no tenderness.  Musculoskeletal: She exhibits no edema.  Neurological: She is alert and oriented to person, place, and time.  Skin: Skin is warm and dry. She is not diaphoretic.  Psychiatric: She has a normal mood and affect. Her behavior is normal.   Assessment/Plan:  Principal Problem:  Acute pancreatitis  Active Problems:  Hypertension  GERD (gastroesophageal reflux disease)  Hypokalemia  Leukocytosis, unspecified  Alcohol abuse  SIRS (systemic inflammatory response syndrome)  Acute systolic CHF (congestive heart failure)  Tobacco abuse  Family history of premature CAD    Plan: Patient is a 58 y/o female with multiple cardiac risk factors as mentioned above, with newly diagnosed combined systolic + diastolic HF.  EF severely reduced at 20-25%. Recent chest pain symptoms and dyspnea are consistent with likely coronary ischemia. Now that pancreatitis seems be improved, we will plan for a R/LHC in  the am. Will place on appropriate HF meds, including a BB and ACE-I. Will start 6.25 mg of Coreg BID and 5 mg of lisinopril daily. Will continue IV Lasix. Strict I/Os, daily weights and low sodium diet. Encourage smoking cessation. Recommend that TR[facility] consult SW to help patient with medication needs and to establish care with PCP as she has no insurance.   [name]  05/09/2013, 2:07 PM   Patient seen with PA, agree with the above note.  1. Acute pancreatitis: ETOH-related.  Symptoms have resolved, she has been restarted on a diet.  2. CAD: Patient has a cardiomyopathy of uncertain etiology. She has had exertional chest tightness for two months, progressive to the point where she can get chest pain walking across a room.   This resolves with rest.  I am very concerned for angina.  RFs include active smoking and family history of premature CAD.  - I will arrange for [facility] tomorrow.  - Continue beta blocker, add ASA 81 and statin.  3. Acute systolic CHF: Patient was found to have EF 20-25% with diffuse hypokinesis with some regionality.  As above, concerned for ischemic cardiomyopathy.  She is also at risk for ETOH-related cardiomyopathy.  She is short of breath with minimal exertion, BNP is elevated.  She looks mildly volume overloaded on exam.   - Plan LHC/RHC tomorrow.  - Agree with continuing Lasix 40 mg IV bid for now but will need to follow weights and I/Os.  - Can increase Coreg to 6.25 mg bid and add lisinopril 5 mg daily.  4. Smoking: Active smoker, some of her dyspnea may be related to COPD.  Counseled to quit.  5. ETOH abuse: Counseled to quit.  6. She does not have insurance, needs social work to help with setting of Medicaid and with meds.   Larey Dresser 05/09/2013 3:07 PM

## 2013-05-10 ENCOUNTER — Inpatient Hospital Stay (HOSPITAL_COMMUNITY): Payer: Medicaid Other

## 2013-05-10 ENCOUNTER — Encounter (HOSPITAL_COMMUNITY)
Admission: EM | Disposition: A | Discharge status: Skilled Nursing Facility | Payer: Self-pay | Source: Home / Self Care | Attending: Internal Medicine

## 2013-05-10 DIAGNOSIS — I428 Other cardiomyopathies: Secondary | ICD-10-CM

## 2013-05-10 DIAGNOSIS — I2510 Atherosclerotic heart disease of native coronary artery without angina pectoris: Secondary | ICD-10-CM

## 2013-05-10 DIAGNOSIS — I209 Angina pectoris, unspecified: Secondary | ICD-10-CM | POA: Diagnosis present

## 2013-05-10 DIAGNOSIS — J9600 Acute respiratory failure, unspecified whether with hypoxia or hypercapnia: Secondary | ICD-10-CM

## 2013-05-10 HISTORY — PX: LEFT AND RIGHT HEART CATHETERIZATION WITH CORONARY ANGIOGRAM: SHX5449

## 2013-05-10 LAB — MAGNESIUM: Magnesium: 2.2 mg/dL (ref 1.5–2.5)

## 2013-05-10 LAB — COMPREHENSIVE METABOLIC PANEL
ALT: 13 U/L (ref 0–35)
AST: 23 U/L (ref 0–37)
Albumin: 2.5 g/dL — ABNORMAL LOW (ref 3.5–5.2)
Alkaline Phosphatase: 87 U/L (ref 39–117)
BUN: 16 mg/dL (ref 6–23)
CHLORIDE: 95 meq/L — AB (ref 96–112)
CO2: 28 mEq/L (ref 19–32)
Calcium: 8.7 mg/dL (ref 8.4–10.5)
Creatinine, Ser: 0.94 mg/dL (ref 0.50–1.10)
GFR calc Af Amer: 76 mL/min — ABNORMAL LOW (ref >90)
GFR calc non Af Amer: 66 mL/min — ABNORMAL LOW (ref >90)
Glucose, Bld: 128 mg/dL — ABNORMAL HIGH (ref 70–99)
POTASSIUM: 3.1 meq/L — AB (ref 3.7–5.3)
Sodium: 138 mEq/L (ref 137–147)
Total Bilirubin: 0.7 mg/dL (ref 0.3–1.2)
Total Protein: 6.6 g/dL (ref 6.0–8.3)

## 2013-05-10 LAB — POCT I-STAT 3, VENOUS BLOOD GAS (G3P V)
ACID-BASE EXCESS: 1 mmol/L (ref 0.0–2.0)
Bicarbonate: 25.2 mEq/L — ABNORMAL HIGH (ref 20.0–24.0)
O2 SAT: 53 %
PO2 VEN: 28 mmHg — AB (ref 30.0–45.0)
TCO2: 26 mmol/L (ref 0–100)
pCO2, Ven: 39 mmHg — ABNORMAL LOW (ref 45.0–50.0)
pH, Ven: 7.418 — ABNORMAL HIGH (ref 7.250–7.300)

## 2013-05-10 LAB — CBC
HCT: 44.9 % (ref 36.0–46.0)
Hemoglobin: 15.2 g/dL — ABNORMAL HIGH (ref 12.0–15.0)
MCH: 33.3 pg (ref 26.0–34.0)
MCHC: 33.9 g/dL (ref 30.0–36.0)
MCV: 98.2 fL (ref 78.0–100.0)
PLATELETS: 176 10*3/uL (ref 150–400)
RBC: 4.57 MIL/uL (ref 3.87–5.11)
RDW: 13.2 % (ref 11.5–15.5)
WBC: 8.7 10*3/uL (ref 4.0–10.5)

## 2013-05-10 LAB — POCT I-STAT 3, ART BLOOD GAS (G3+)
BICARBONATE: 23.9 meq/L (ref 20.0–24.0)
O2 Saturation: 90 %
PCO2 ART: 35 mmHg (ref 35.0–45.0)
TCO2: 25 mmol/L (ref 0–100)
pH, Arterial: 7.442 (ref 7.350–7.450)
pO2, Arterial: 56 mmHg — ABNORMAL LOW (ref 80.0–100.0)

## 2013-05-10 LAB — POCT ACTIVATED CLOTTING TIME
Activated Clotting Time: 232 seconds
Activated Clotting Time: 138 seconds

## 2013-05-10 LAB — PROTIME-INR
INR: 1.08 (ref 0.00–1.49)
Prothrombin Time: 13.8 seconds (ref 11.6–15.2)

## 2013-05-10 SURGERY — LEFT AND RIGHT HEART CATHETERIZATION WITH CORONARY ANGIOGRAM
Anesthesia: LOCAL

## 2013-05-10 MED ORDER — NITROGLYCERIN 0.2 MG/ML ON CALL CATH LAB
INTRAVENOUS | Status: AC
  Filled 2013-05-10: qty 1

## 2013-05-10 MED ORDER — HEPARIN (PORCINE) IN NACL 2-0.9 UNIT/ML-% IJ SOLN
INTRAMUSCULAR | Status: AC
  Filled 2013-05-10: qty 1000

## 2013-05-10 MED ORDER — MIDAZOLAM HCL 2 MG/2ML IJ SOLN
INTRAMUSCULAR | Status: AC
  Filled 2013-05-10: qty 2

## 2013-05-10 MED ORDER — POTASSIUM CHLORIDE CRYS ER 20 MEQ PO TBCR
20.0000 meq | EXTENDED_RELEASE_TABLET | Freq: Once | ORAL | Status: AC
  Administered 2013-05-10: 20 meq via ORAL
  Filled 2013-05-10: qty 1

## 2013-05-10 MED ORDER — LIDOCAINE HCL (PF) 1 % IJ SOLN
INTRAMUSCULAR | Status: AC
  Filled 2013-05-10: qty 60

## 2013-05-10 MED ORDER — ZOLPIDEM TARTRATE 5 MG PO TABS
5.0000 mg | ORAL_TABLET | Freq: Once | ORAL | Status: AC
  Administered 2013-05-10: 5 mg via ORAL
  Filled 2013-05-10: qty 1

## 2013-05-10 MED ORDER — HEPARIN SODIUM (PORCINE) 1000 UNIT/ML IJ SOLN
INTRAMUSCULAR | Status: AC
  Filled 2013-05-10: qty 1

## 2013-05-10 MED ORDER — SODIUM CHLORIDE 0.9 % IV SOLN: 250.0000 mL | INTRAVENOUS | Status: DC | PRN

## 2013-05-10 MED ORDER — SODIUM CHLORIDE 0.9 % IV SOLN: 1.0000 mL/kg/h | INTRAVENOUS | Status: AC

## 2013-05-10 MED ORDER — SODIUM CHLORIDE 0.9 % IJ SOLN
3.0000 mL | INTRAMUSCULAR | Status: DC | PRN
Start: 2013-05-10 — End: 2013-05-11

## 2013-05-10 MED ORDER — POTASSIUM CHLORIDE CRYS ER 20 MEQ PO TBCR: 20.0000 meq | EXTENDED_RELEASE_TABLET | Freq: Two times a day (BID) | ORAL | Status: DC

## 2013-05-10 MED ORDER — FENTANYL CITRATE 0.05 MG/ML IJ SOLN
INTRAMUSCULAR | Status: AC
  Filled 2013-05-10: qty 2

## 2013-05-10 MED ORDER — POTASSIUM CHLORIDE CRYS ER 20 MEQ PO TBCR
40.0000 meq | EXTENDED_RELEASE_TABLET | Freq: Four times a day (QID) | ORAL | Status: AC
  Administered 2013-05-10: 40 meq via ORAL
  Filled 2013-05-10: qty 2

## 2013-05-10 MED ORDER — POTASSIUM CHLORIDE CRYS ER 20 MEQ PO TBCR
20.0000 meq | EXTENDED_RELEASE_TABLET | Freq: Two times a day (BID) | ORAL | Status: DC
  Administered 2013-05-10: 20 meq via ORAL
  Filled 2013-05-10: qty 1

## 2013-05-10 MED ORDER — SODIUM CHLORIDE 0.9 % IJ SOLN: 3.0000 mL | Freq: Two times a day (BID) | INTRAMUSCULAR | Status: DC

## 2013-05-10 NOTE — Interval H&P Note (Signed)
History and Physical Interval Note:  05/10/2013 5:34 PM  [name]  has presented today for surgery, with the diagnosis of New Diagnosis Systolic Heart Failure - EF 20-25%.    The various methods of treatment have been discussed with the patient and family. After consideration of risks, benefits and other options for treatment, the patient has consented to  Procedure(s): LEFT AND RIGHT HEART CATHETERIZATION WITH CORONARY ANGIOGRAM (N/A) & Possible PCI as a surgical intervention .    The patient's history has been reviewed, patient examined, no change in status, stable for surgery per recommendation by Dr. Sallyanne Kuster.  I have reviewed the patient's chart and labs.  Questions were answered to the patient's satisfaction.     [name]  Cath Lab Visit (complete for each Cath Lab visit)  Clinical Evaluation Leading to the Procedure:   ACS: no  Non-ACS:    Anginal Classification: CCS II -NYHA CHF class II  Anti-ischemic medical therapy: Maximal Therapy (2 or more classes of medications)  Non-Invasive Test Results: High-risk stress test findings: cardiac mortality >3%/year; echocardiogram in EF 20-25%  Prior CABG: No previous CABG

## 2013-05-10 NOTE — Progress Notes (Signed)
[facility] PROGRESS NOTE  [name] MPN:[id number] DOB: September 18, 1955 DOA: 05/06/2013 PCP: No PCP Per Patient  HPI [name] is a 58 y.o. female with PMH significant for pancreatitis, alcohol abuse/binge drinking and other medical problems as listed below who presented to the ED with abd pain and N/V. She stated that she began having severe abd pain with associated N/V at about 12:30 AM 05/06/13. The emesis was non bloody and she states that pain was upper abdominal-more on L. side. She related that her last drink was Tues and that she usually drinks liquor but states she cannot quantify. In the ED her alcohol level was <11, lipase in 2700s and k,3.2 . She was treated with multiple doses of analgesics and was admitted for further eval and management.  Subjective: Cardiac cath today to evaluate for dilated cardiomyopathy. Patient is seen off of the oxygen, denies any significant complaints.  Assessment/Plan:  Principal Problem:   Acute pancreatitis Active Problems:   Hypertension   GERD (gastroesophageal reflux disease)   Hypokalemia   Leukocytosis, unspecified   Alcohol abuse   SIRS (systemic inflammatory response syndrome)   Acute systolic CHF (congestive heart failure)   Tobacco abuse   Family history of premature CAD   Acute respiratory failure   Acute Pancreatitis with previous history of same. -Secondary to alcohol (Lipase initially 2791-now 105, abd pain, N/V) -NPO, antiemetics, pain management with Dilaudid (decreased dose). -Counseled extensively about alcohol. -Abdominal pain improved, started on clear liquids diet yesterday likely to advance after cardiac cath.  Acute systolic CHF -2-D echocardiogram showed LVEF of 20-25%. -Chest x-ray showed vascular congestion and no small pleural effusion. -Started on IV Lasix, Coreg, check chest x-ray in a.m. -Cardiology to evaluate for new onset CHF.  Acute Hypoxic Respiratory Failure -Oxygen sats in the 60s  momentarily, rebounded to the low 80s on 4L -Multifactorial, likely because of undiagnosed COPD, acute CHF and decreased respiratory drive secondary to benzos and narcotics. -Cardiomegaly and pleural effusion noted on xray 4/17 -Critical Care Consultation appreciated. -Acute CHF is probably playing a big role on her respiratory failure.  Hypertension - vacillating SBP 130s to 90s  -No meds outpt, monitor and treat accordingly  -Currently hypotensive  ETOH abuse -Reported recent ETOH intake, counseled to quit drinking.  -ETOH <11 on 05/06/13 -Monitor for possible withdrawal.  CIWA protocol ordered.  GERD (gastroesophageal reflux disease)  -Continue PPI therapy   Hypokalemia/Hyperkalemia  -Replaced K, no GU to aggressive diuresis -Replete with oral supplements.  Leukocytosis, unspecified  -Likely d/t inflammation -urine culture pending, monitor    Code Status: Full Family Communication: No one at bedside Disposition Plan: Transferred to [facility] unit   Consultants:  Critical Care.  Cardiology  Antibiotics:  None at this time    Objective: Filed Vitals:   05/10/13 1500  BP:   Pulse:   Temp: 97.9 F (36.6 C)  Resp:     Intake/Output Summary (Last 24 hours) at 05/10/13 1730 Last data filed at 05/10/13 1700  Gross per 24 hour  Intake    260 ml  Output   1675 ml  Net  -1415 ml   Filed Weights   05/06/13 0400 05/09/13 0426 05/10/13 0415  Weight: 63.504 kg (140 lb) 65.6 kg (144 lb 10 oz) 64.8 kg (142 lb 13.7 oz)    Exam:  Physical Exam  Constitutional:  Unconscious, across the bed  HENT:  Head: Normocephalic and atraumatic.  Eyes: Right pupil is not reactive. Left pupil is not reactive.  Pupils  were ~size 2 and nonreactive to light  Cardiovascular: Normal heart sounds and intact distal pulses.   Respiratory: Bradypnea noted. She has decreased breath sounds.  Respirations were shallow and slow, minor expiratory wheezing noted  GI: She exhibits no  distension and no mass.  Post Narcan pt c/o abd tenderness to palpation: LUQ and epigastric region  Neurological: She is unresponsive. GCS eye subscore is 1. GCS verbal subscore is 1. GCS motor subscore is 1.  Initially pt was unable to be awakened but with further stimulation she would startle awake then fall back into a stupor.  Skin: There is cyanosis. There is pallor. Nails show clubbing.  Hands were cool, pale/cyanotic    Data Reviewed: Basic Metabolic Panel:  Recent Labs Lab 05/07/13 0705 05/07/13 1800 05/08/13 0436 05/09/13 0340 05/10/13 0249  NA 146 145 145 135* 138  K 5.6* 4.2 3.7 3.2* 3.1*  CL 110 109 106 95* 95*  CO2 20 19 25 20 28   GLUCOSE 90 91 91 72 128*  BUN 13 13 11 14 16   CREATININE 0.96 0.93 0.92 0.83 0.94  CALCIUM 8.3* 9.0 8.6 8.4 8.7  MG  --   --   --   --  2.2   Liver Function Tests:  Recent Labs Lab 05/06/13 0424 05/08/13 0436 05/09/13 0340 05/10/13 0249  AST 41* 36 34 23  ALT 31 19 16 13   ALKPHOS 113 92 87 87  BILITOT 0.2* 0.4 0.7 0.7  PROT 7.4 6.7 6.6 6.6  ALBUMIN 3.8 2.9* 2.5* 2.5*    Recent Labs Lab 05/06/13 0424 05/07/13 0705 05/08/13 0436  LIPASE 2791* 702* 105*   CBC:  Recent Labs Lab 05/06/13 0424 05/07/13 0955 05/08/13 0436 05/09/13 0340 05/10/13 0249  WBC 13.4* 20.1* 15.1* 9.7 8.7  NEUTROABS 11.2*  --   --   --   --   HGB 16.7* 14.7 14.9 15.9* 15.2*  HCT 48.2* 46.0 45.9 46.8* 44.9  MCV 97.0 104.1* 103.1* 98.3 98.2  PLT 282 163 154 PLATELET CLUMPS NOTED ON SMEAR, COUNT APPEARS ADEQUATE 176    Studies: Dg Chest Port 1 View  05/10/2013   CLINICAL DATA:  Fluid overload.  EXAM: PORTABLE CHEST - 1 VIEW  COMPARISON:  05/07/2013  FINDINGS: Cardiac silhouette remains mildly enlarged, unchanged. The patient has taken a greater inspiration than on the prior study, and there is improved aeration of the lung bases. No airspace consolidation, pulmonary edema, pneumothorax, or definite pleural effusion is identified. There is mild  elevation of the right hemidiaphragm.  IMPRESSION: Improved aeration of the lung bases. No evidence of new airspace disease.   Electronically Signed   By: Logan Bores   On: 05/10/2013 08:01    Scheduled Meds: . aspirin  81 mg Oral Daily  . atorvastatin  40 mg Oral q1800  . carvedilol  6.25 mg Oral BID WC  . enoxaparin (LOVENOX) injection  40 mg Subcutaneous Q24H  . folic acid  1 mg Oral Daily  . furosemide  40 mg Intravenous Daily  . imipenem-cilastatin  250 mg Intravenous 4 times per day  . lisinopril  5 mg Oral Daily  . multivitamin with minerals  1 tablet Oral Daily  . pantoprazole (PROTONIX) IV  40 mg Intravenous Q12H  . potassium chloride  20 mEq Oral BID  . sodium chloride  3 mL Intravenous Q12H  . thiamine  100 mg Oral Daily   Continuous Infusions: . sodium chloride 10 mL/hr at 05/10/13 7616    Principal Problem:  Acute pancreatitis Active Problems:   Hypertension   GERD (gastroesophageal reflux disease)   Hypokalemia   Leukocytosis, unspecified   Alcohol abuse   SIRS (systemic inflammatory response syndrome)   Acute systolic CHF (congestive heart failure)   Tobacco abuse   Family history of premature CAD   Acute respiratory failure    Time spent: 35 minutes  Verlee Monte, MD [phone]  [facility]  If 7PM-7AM, please contact night-coverage at [email or web address], password [facility] 05/10/2013, 5:30 PM  LOS: 4 days

## 2013-05-10 NOTE — CV Procedure (Signed)
CARDIAC CATHETERIZATION REPORT  NAME:  [name]   MRN: [id number] DOB:  Mar 18, 1955   ADMIT DATE: 05/06/2013 Procedure Date: 05/10/2013  INTERVENTIONAL CARDIOLOGIST: [name], M.D., MS PRIMARY CARE PROVIDER: No PCP Per Patient PRIMARY CARDIOLOGIST: [name], M.D.  PATIENT:  [name] is a 58 y.o. female with no prior cardiac history admitted by [facility] service for pancreatitis on April 16. Echocardiogram was performed which demonstrated severely reduced EF of 20-25% with hypokinesis diffusely and akinesis of the basal inferior wall. Cardiology was counseled.  Patient herself has no prior cardiac history, but is multiple risk factors for CAD including: Strong family history of premature CAD (father at age 58), long-term tobacco abuse of one pack per day for 30+ years, untreated hypertension and hyperlipidemia, prior CVA with residual left-sided weakness.  Of note, she also has chronic alcohol abuse. On cardiac consultation she was found to be also noting about 2 weeks worth of chest discomfort symptoms concerning for at least class II angina. Based on the high-risk echocardiogram, and strong risk factors, she is referred for diagnostic right and left heart catheterization.  PRE-OPERATIVE DIAGNOSIS:    Severe Cardiomyopathy, presumed ischemic  Class II Angina  PROCEDURES PERFORMED:    Right And Left Heart Catheterization With Coronary Angiography  PROCEDURE:Consent:  Risks of procedure as well as the alternatives and risks of each were explained to the (patient/caregiver).  Consent for procedure obtained. Consent for signed by MD and patient with RN witness -- placed on chart.   PROCEDURE: The patient was brought to the [facility] in the fasting state and prepped and draped in the usual sterile fashion for Both Right and Left groin or radial access. A modified Allen's test with plethysmography was performed, revealing excellent Ulnar  artery collateral flow.  Sterile technique was used including antiseptics, cap, gloves, gown, hand hygiene, mask and sheet.  Skin prep: Chlorhexidine.  Time Out: Verified patient identification, verified procedure, site/side was marked, verified correct patient position, special equipment/implants available, medications/allergies/relevent history reviewed, required imaging and test results available.  Performed  Access:   After Several Attempts at the Right Common Femoral Artery, attention was turned to the Left Common Femoral Artery which was accessed using ultrasound guidance; 5 Fr Sheath -- fluoroscopically and ultrasound guided modified Seldinger technique   The sheath was exchanged for a new 5 French sheath after the first catheter exchange do to concern for clot. Upon removal there was indeed a clot in the sheath itself.  4000 units of IV heparin were administered for an ACT of 234 Sec  After attempts were made at both femoral veins without guidance, finally using direct ultrasound guidance, the Right Common Femoral Vein  Right Heart Catheterization: 7Fr Swan Ganz catheter advanced under fluoroscopy with balloon inflated to the RA, RV, then PCWP-PA for hemodynamic measurement.  Simultaneous FA & PA blood gases checked for SaO2% to calculate FICK CO/CI  Thermodilution Injections performed to calculate CO/CI  Catheter removed completely out of the body with balloon deflated.  Diagnostic Left Heart Catheterization with Coronary Angiography:  5 Fr JL4, JR 4 catheters advanced and exchanged over initially a Versicore followed by a candidate J-wire into the ascending aorta and used for selective coronary artery engagement.  Left Coronary Artery Angiography: JL4  Right Coronary Artery Angiography: JR 4  LV Hemodynamics: JR 4  Sheaths:  Sutured in place to be removed in the PACU holding area versus 6C post procedure suite with manual pressure being  held for hemostasis.  Hemodynamics:   SaO2% Pressures mmHg Mean P mmHg EDP mmHg      Right Atrium   7/7    4       Right Ventricle   30/2    7       Pulmonary Artery  53   31/19   24        PCWP   13/12    10       Central Aortic  90  131/84  104        Left Ventricle   128/7    12          Cardiac Output:  Cardiac Index:       Fick  2.92    1.74        Thermodilution  2.4    1.43     Left Ventriculography: Not performed  Coronary Anatomy: Right dominant  Left Main: Large-caliber vessel free of any significant disease, and trifurcates into the LAD, Ramus Intermedius, and Circumflex. LAD: Begins as a very small vessel that appears to be diffusely diseased proximally and then is essentially 100% occluded after first septal perforator. There is filling from right to left collaterals as noted below  Left Circumflex: Large-caliber vessel that branches into a moderate large-caliber OM1 and the follow on AV groove circumflex which terminates as a small OM 2 and AV groove/LPL. The distal circumflex tapers into a small vessel, but no significant lesions; however, OM1 does have a proximal tubular ~60% stenosis but the rest of the vessel is relatively free of any disease,  OM1 is slightly larger than the Ramus Intermedius  Ramus Intermedius: Moderate large-caliber vessel that courses of high OM. It is somewhat tortuous as it reaches almost the apex. Mild luminal irregularities.   RCA: Moderate caliber vessel with ostial spasm relieved with nitroglycerin, tapers are relatively smaller moderate caliber vessel that terminates as a PDA that is quite tortuous to there is a small  Right Posterior AV Groove Branch (RPAV) with proximal 80% irregular stenosis that gives off 2 posterolateral branches. The RCA itself is relatively free of significant disease.  RPDA: Terminates as a smaller moderate caliber vessel with several septal perforators the perfuse the distal portion of the occluded LAD  After reviewing the initial angiography, the  culprit lesion was thought to be 100% CTO of the LAD.  This would be best treated medically.  MEDICATIONS:  Anesthesia:  Local Lidocaine 18 ml - R, 16 on L  Sedation:  3 mg IV Versed, 75 mcg IV fentanyl ;   Omnipaque Contrast: 50 ml  Anticoagulation:  IV Heparin 4000 Units ; administered after first femoral arterial line clotted -- ACT 234 sec  Intracoronary Nitroglycerin: 200 mcg  PATIENT DISPOSITION:    The patient was transferred to the PACU holding area in a hemodynamicaly stable, chest pain free condition.  The patient tolerated the procedure well, and there were no complications.  EBL:   < 20 ml  No true complication occurred, however the left femoral arterial sheath clotted in between catheter exchange -- IV heparin administered  The patient was stable before, during, and after the procedure.  POST-OPERATIVE DIAGNOSIS:    Severe ischemic cardiomyopathy with chronically occluded LAD, moderate disease in a large OM 2 and severe disease and small posterior lateral vessel.  Severely reduced cardiac output and index, but could be diuresed with an LVEDP and wedge pressure of 10-12 mmHg.  Relatively hypercoagulable.  PLAN OF CARE:  She'll be transferred to [facility] post procedure unit for post catheterization care and monitoring.  Continue aggressive treatment of ischemic cardiomyopathy. The LAD does not appear to be graftable.  If indicated, in the future for angina, could consider PCI OM 2, however the posterior lateral vessel is probably not likely to be PCI amenable  She is appears to be adequate diuresis, however will need to be on standing diuretic   [name], M.D., M.S. [facility] [address] [place], [place]  [address]  [phone]  05/10/2013 8:06 PM

## 2013-05-10 NOTE — H&P (View-Only) (Signed)
Patient Name: [name] Date of Encounter: 05/10/2013  Principal Problem:   Acute pancreatitis Active Problems:   Hypertension   GERD (gastroesophageal reflux disease)   Hypokalemia   Leukocytosis, unspecified   Alcohol abuse   SIRS (systemic inflammatory response syndrome)   Acute systolic CHF (congestive heart failure)   Tobacco abuse   Family history of premature CAD   Acute respiratory failure   Length of Stay: 4  SUBJECTIVE  No cardiac symptoms. No abdominal pain. She is hungry, an encouraging sign. For heart cath today  CURRENT MEDS . aspirin  81 mg Oral Daily  . atorvastatin  40 mg Oral q1800  . carvedilol  6.25 mg Oral BID WC  . enoxaparin (LOVENOX) injection  40 mg Subcutaneous Q24H  . folic acid  1 mg Oral Daily  . furosemide  40 mg Intravenous Daily  . imipenem-cilastatin  250 mg Intravenous 4 times per day  . lisinopril  5 mg Oral Daily  . multivitamin with minerals  1 tablet Oral Daily  . pantoprazole (PROTONIX) IV  40 mg Intravenous Q12H  . potassium chloride  20 mEq Oral BID  . potassium chloride  20 mEq Oral Once  . sodium chloride  3 mL Intravenous Q12H  . thiamine  100 mg Oral Daily    OBJECTIVE   Intake/Output Summary (Last 24 hours) at 05/10/13 1127 Last data filed at 05/10/13 1100  Gross per 24 hour  Intake    320 ml  Output   1650 ml  Net  -1330 ml   Filed Weights   05/06/13 0400 05/09/13 0426 05/10/13 0415  Weight: 140 lb (63.504 kg) 144 lb 10 oz (65.6 kg) 142 lb 13.7 oz (64.8 kg)    PHYSICAL EXAM Filed Vitals:   05/10/13 0415 05/10/13 0700 05/10/13 0806 05/10/13 1105  BP: 137/74 142/92 133/89   Pulse: 83 78 93 71  Temp: 97.5 F (36.4 C) 98.3 F (36.8 C)  98.5 F (36.9 C)  TempSrc: Oral Oral  Oral  Resp: 22 26 17 24   Height:      Weight: 142 lb 13.7 oz (64.8 kg)     SpO2: 93%  96% 98%   General: Alert, oriented x3, no distress Head: no evidence of trauma, PERRL, EOMI, no exophtalmos or lid lag, no myxedema, no  xanthelasma; normal ears, nose and oropharynx Neck: normal jugular venous pulsations and no hepatojugular reflux; brisk carotid pulses without delay and no carotid bruits Chest: clear to auscultation, no signs of consolidation by percussion or palpation, normal fremitus, symmetrical and full respiratory excursions Cardiovascular: normal position and quality of the apical impulse, regular rhythm, normal first and second heart sounds, no rubs or gallops, no murmur Abdomen: no tenderness or distention, no masses by palpation, no abnormal pulsatility or arterial bruits, normal bowel sounds, no hepatosplenomegaly Extremities: no clubbing, cyanosis or edema; 2+ radial, ulnar and brachial pulses bilaterally; 2+ right femoral, posterior tibial and dorsalis pedis pulses; 2+ left femoral, posterior tibial and dorsalis pedis pulses; no subclavian or femoral bruits Neurological: grossly nonfocal  LABS  CBC  Recent Labs  05/09/13 0340 05/10/13 0249  WBC 9.7 8.7  HGB 15.9* 15.2*  HCT 46.8* 44.9  MCV 98.3 98.2  PLT PLATELET CLUMPS NOTED ON SMEAR, COUNT APPEARS ADEQUATE 518   Basic Metabolic Panel  Recent Labs  05/09/13 0340 05/10/13 0249  NA 135* 138  K 3.2* 3.1*  CL 95* 95*  CO2 20 28  GLUCOSE 72 128*  BUN 14 16  CREATININE  0.83 0.94  CALCIUM 8.4 8.7  MG  --  2.2   Liver Function Tests  Recent Labs  05/09/13 0340 05/10/13 0249  AST 34 23  ALT 16 13  ALKPHOS 87 87  BILITOT 0.7 0.7  PROT 6.6 6.6  ALBUMIN 2.5* 2.5*    Recent Labs  05/08/13 0436  LIPASE 105*   Cardiac Enzymes No results found for this basename: CKTOTAL, CKMB, CKMBINDEX, TROPONINI,  in the last 72 hours  Radiology Studies Imaging results have been reviewed and Dg Chest Port 1 View  05/10/2013   CLINICAL DATA:  Fluid overload.  EXAM: PORTABLE CHEST - 1 VIEW  COMPARISON:  05/07/2013  FINDINGS: Cardiac silhouette remains mildly enlarged, unchanged. The patient has taken a greater inspiration than on the prior  study, and there is improved aeration of the lung bases. No airspace consolidation, pulmonary edema, pneumothorax, or definite pleural effusion is identified. There is mild elevation of the right hemidiaphragm.  IMPRESSION: Improved aeration of the lung bases. No evidence of new airspace disease.   Electronically Signed   By: Logan Bores   On: 05/10/2013 08:01    TELE NSR  ECG NSR, minor IVCD, NSST-T changes  ASSESSMENT AND PLAN  Severe cardiomyopathy of unknown etiology, strong suspicion of CAD. No signs of acute coronary sd. Cath today. This procedure has been fully reviewed with the patient and written informed consent has been obtained.    Sanda Klein, MD, [facility] [phone] office [phone] pager 05/10/2013 11:27 AM

## 2013-05-10 NOTE — Plan of Care (Signed)
Noted  

## 2013-05-10 NOTE — Progress Notes (Signed)
Patient Name: [name] Date of Encounter: 05/10/2013  Principal Problem:   Acute pancreatitis Active Problems:   Hypertension   GERD (gastroesophageal reflux disease)   Hypokalemia   Leukocytosis, unspecified   Alcohol abuse   SIRS (systemic inflammatory response syndrome)   Acute systolic CHF (congestive heart failure)   Tobacco abuse   Family history of premature CAD   Acute respiratory failure   Length of Stay: 4  SUBJECTIVE  No cardiac symptoms. No abdominal pain. She is hungry, an encouraging sign. For heart cath today  CURRENT MEDS . aspirin  81 mg Oral Daily  . atorvastatin  40 mg Oral q1800  . carvedilol  6.25 mg Oral BID WC  . enoxaparin (LOVENOX) injection  40 mg Subcutaneous Q24H  . folic acid  1 mg Oral Daily  . furosemide  40 mg Intravenous Daily  . imipenem-cilastatin  250 mg Intravenous 4 times per day  . lisinopril  5 mg Oral Daily  . multivitamin with minerals  1 tablet Oral Daily  . pantoprazole (PROTONIX) IV  40 mg Intravenous Q12H  . potassium chloride  20 mEq Oral BID  . potassium chloride  20 mEq Oral Once  . sodium chloride  3 mL Intravenous Q12H  . thiamine  100 mg Oral Daily    OBJECTIVE   Intake/Output Summary (Last 24 hours) at 05/10/13 1127 Last data filed at 05/10/13 1100  Gross per 24 hour  Intake    320 ml  Output   1650 ml  Net  -1330 ml   Filed Weights   05/06/13 0400 05/09/13 0426 05/10/13 0415  Weight: 140 lb (63.504 kg) 144 lb 10 oz (65.6 kg) 142 lb 13.7 oz (64.8 kg)    PHYSICAL EXAM Filed Vitals:   05/10/13 0415 05/10/13 0700 05/10/13 0806 05/10/13 1105  BP: 137/74 142/92 133/89   Pulse: 83 78 93 71  Temp: 97.5 F (36.4 C) 98.3 F (36.8 C)  98.5 F (36.9 C)  TempSrc: Oral Oral  Oral  Resp: 22 26 17 24   Height:      Weight: 142 lb 13.7 oz (64.8 kg)     SpO2: 93%  96% 98%   General: Alert, oriented x3, no distress Head: no evidence of trauma, PERRL, EOMI, no exophtalmos or lid lag, no myxedema, no  xanthelasma; normal ears, nose and oropharynx Neck: normal jugular venous pulsations and no hepatojugular reflux; brisk carotid pulses without delay and no carotid bruits Chest: clear to auscultation, no signs of consolidation by percussion or palpation, normal fremitus, symmetrical and full respiratory excursions Cardiovascular: normal position and quality of the apical impulse, regular rhythm, normal first and second heart sounds, no rubs or gallops, no murmur Abdomen: no tenderness or distention, no masses by palpation, no abnormal pulsatility or arterial bruits, normal bowel sounds, no hepatosplenomegaly Extremities: no clubbing, cyanosis or edema; 2+ radial, ulnar and brachial pulses bilaterally; 2+ right femoral, posterior tibial and dorsalis pedis pulses; 2+ left femoral, posterior tibial and dorsalis pedis pulses; no subclavian or femoral bruits Neurological: grossly nonfocal  LABS  CBC  Recent Labs  05/09/13 0340 05/10/13 0249  WBC 9.7 8.7  HGB 15.9* 15.2*  HCT 46.8* 44.9  MCV 98.3 98.2  PLT PLATELET CLUMPS NOTED ON SMEAR, COUNT APPEARS ADEQUATE 518   Basic Metabolic Panel  Recent Labs  05/09/13 0340 05/10/13 0249  NA 135* 138  K 3.2* 3.1*  CL 95* 95*  CO2 20 28  GLUCOSE 72 128*  BUN 14 16  CREATININE  0.83 0.94  CALCIUM 8.4 8.7  MG  --  2.2   Liver Function Tests  Recent Labs  05/09/13 0340 05/10/13 0249  AST 34 23  ALT 16 13  ALKPHOS 87 87  BILITOT 0.7 0.7  PROT 6.6 6.6  ALBUMIN 2.5* 2.5*    Recent Labs  05/08/13 0436  LIPASE 105*   Cardiac Enzymes No results found for this basename: CKTOTAL, CKMB, CKMBINDEX, TROPONINI,  in the last 72 hours  Radiology Studies Imaging results have been reviewed and Dg Chest Port 1 View  05/10/2013   CLINICAL DATA:  Fluid overload.  EXAM: PORTABLE CHEST - 1 VIEW  COMPARISON:  05/07/2013  FINDINGS: Cardiac silhouette remains mildly enlarged, unchanged. The patient has taken a greater inspiration than on the prior  study, and there is improved aeration of the lung bases. No airspace consolidation, pulmonary edema, pneumothorax, or definite pleural effusion is identified. There is mild elevation of the right hemidiaphragm.  IMPRESSION: Improved aeration of the lung bases. No evidence of new airspace disease.   Electronically Signed   By: Logan Bores   On: 05/10/2013 08:01    TELE NSR  ECG NSR, minor IVCD, NSST-T changes  ASSESSMENT AND PLAN  Severe cardiomyopathy of unknown etiology, strong suspicion of CAD. No signs of acute coronary sd. Cath today. This procedure has been fully reviewed with the patient and written informed consent has been obtained.    Sanda Klein, MD, [facility] [phone] office [phone] pager 05/10/2013 11:27 AM

## 2013-05-11 DIAGNOSIS — I209 Angina pectoris, unspecified: Secondary | ICD-10-CM

## 2013-05-11 DIAGNOSIS — I10 Essential (primary) hypertension: Secondary | ICD-10-CM

## 2013-05-11 LAB — BASIC METABOLIC PANEL
BUN: 17 mg/dL (ref 6–23)
CO2: 23 mEq/L (ref 19–32)
Calcium: 8.8 mg/dL (ref 8.4–10.5)
Chloride: 102 mEq/L (ref 96–112)
Creatinine, Ser: 0.78 mg/dL (ref 0.50–1.10)
Glucose, Bld: 109 mg/dL — ABNORMAL HIGH (ref 70–99)
Potassium: 3.9 mEq/L (ref 3.7–5.3)
SODIUM: 137 meq/L (ref 137–147)

## 2013-05-11 MED ORDER — ZOLPIDEM TARTRATE 5 MG PO TABS
5.0000 mg | ORAL_TABLET | Freq: Once | ORAL | Status: AC
  Administered 2013-05-12: 01:00:00 5 mg via ORAL
  Filled 2013-05-11: qty 1

## 2013-05-11 MED ORDER — SODIUM CHLORIDE 0.9 % IV SOLN
INTRAVENOUS | Status: AC
  Administered 2013-05-11: 21:00:00 via INTRAVENOUS

## 2013-05-11 MED ORDER — PANTOPRAZOLE SODIUM 40 MG PO TBEC
40.0000 mg | DELAYED_RELEASE_TABLET | Freq: Two times a day (BID) | ORAL | Status: DC
  Administered 2013-05-11 – 2013-05-12 (×2): 40 mg via ORAL
  Filled 2013-05-11 (×2): qty 1

## 2013-05-11 NOTE — Progress Notes (Signed)
Subjective: Some nausea earlier  Objective: Vital signs in last 24 hours: Temp:  [97.7 F (36.5 C)-98.5 F (36.9 C)] 97.7 F (36.5 C) (04/21 0009) Pulse Rate:  [71-93] 88 (04/21 0009) Resp:  [16-27] 16 (04/21 0009) BP: (113-155)/(71-107) 128/73 mmHg (04/21 0009) SpO2:  [95 %-99 %] 96 % (04/21 0009) Last BM Date: 05/07/13  Intake/Output from previous day: 04/20 0701 - 04/21 0700 In: 476.8 [P.O.:50; I.V.:326.8; IV Piggyback:100] Out: 625 [Urine:625] Intake/Output this shift: Total I/O In: 226.8 [I.V.:226.8] Out: 300 [Urine:300]  Medications Current Facility-Administered Medications  Medication Dose Route Frequency Provider Last Rate Last Dose  . 0.9 %  sodium chloride infusion  250 mL Intravenous PRN [name], MD      . acetaminophen (TYLENOL) tablet 650 mg  650 mg Oral Q6H PRN Sheila Oats, MD       Or  . acetaminophen (TYLENOL) suppository 650 mg  650 mg Rectal Q6H PRN Adeline C Viyuoh, MD      . aspirin chewable tablet 81 mg  81 mg Oral Daily Larey Dresser, MD   81 mg at 05/10/13 1107  . atorvastatin (LIPITOR) tablet 40 mg  40 mg Oral q1800 Brittainy Simmons, PA-C   40 mg at 05/10/13 2356  . bisacodyl (DULCOLAX) EC tablet 10 mg  10 mg Oral Daily PRN Adeline C Viyuoh, MD      . carvedilol (COREG) tablet 6.25 mg  6.25 mg Oral BID WC Samella Parr, NP   6.25 mg at 05/10/13 2356  . enoxaparin (LOVENOX) injection 40 mg  40 mg Subcutaneous Q24H Adeline C Viyuoh, MD   40 mg at 05/09/13 1730  . folic acid (FOLVITE) tablet 1 mg  1 mg Oral Daily Adeline C Viyuoh, MD   1 mg at 05/10/13 1105  . furosemide (LASIX) injection 40 mg  40 mg Intravenous Daily Samella Parr, NP      . HYDROmorphone (DILAUDID) injection 0.5 mg  0.5 mg Intravenous Q4H PRN Melton Alar, PA-C      . levalbuterol (XOPENEX) nebulizer solution 0.63 mg  0.63 mg Nebulization Q6H PRN Ritta Slot, NP      . lisinopril (PRINIVIL,ZESTRIL) tablet 5 mg  5 mg Oral Daily Brittainy Simmons, PA-C   5 mg  at 05/10/13 1105  . multivitamin with minerals tablet 1 tablet  1 tablet Oral Daily Sheila Oats, MD   1 tablet at 05/09/13 0930  . naloxone [facility]) injection 0.4 mg  0.4 mg Intravenous PRN Melton Alar, PA-C   0.4 mg at 05/07/13 0849  . ondansetron (ZOFRAN) injection 4 mg  4 mg Intravenous Q4H PRN Sheila Oats, MD   4 mg at 05/11/13 0322  . pantoprazole (PROTONIX) injection 40 mg  40 mg Intravenous Q12H Adeline C Viyuoh, MD   40 mg at 05/10/13 2300  . sodium chloride 0.9 % injection 3 mL  3 mL Intravenous Q12H [name], MD      . sodium chloride 0.9 % injection 3 mL  3 mL Intravenous PRN [name], MD      . thiamine (VITAMIN B-1) tablet 100 mg  100 mg Oral Daily Sheila Oats, MD   100 mg at 05/10/13 1105    PE: General appearance: alert, cooperative and no distress Neck: no JVD Lungs: clear to auscultation bilaterally Heart: regular rate and rhythm, S1, S2 normal, no murmur, click, rub or gallop Extremities: No LEE Pulses: 2+ and symmetric 1+ left  DP Skin: Warm and dry.  No hematoma in right groin.  Mildly tender Neurologic: Grossly normal  Lab Results:   Recent Labs  05/09/13 0340 05/10/13 0249  WBC 9.7 8.7  HGB 15.9* 15.2*  HCT 46.8* 44.9  PLT PLATELET CLUMPS NOTED ON SMEAR, COUNT APPEARS ADEQUATE 176   BMET  Recent Labs  05/09/13 0340 05/10/13 0249 05/11/13 0336  NA 135* 138 137  K 3.2* 3.1* 3.9  CL 95* 95* 102  CO2 20 28 23   GLUCOSE 72 128* 109*  BUN 14 16 17   CREATININE 0.83 0.94 0.78  CALCIUM 8.4 8.7 8.8   PT/INR  Recent Labs  05/10/13 0249  LABPROT 13.8  INR 1.08    Assessment/Plan  58 y/o female, with no prior cardiac history, who was admitted by [facility] on 05/06/13 for pancreatitis. Her w/u included a 2D echo which demonstrated severely reduced systolic function with an EF of 20-25%. Diffuse hypokinesis with akinesis of the basal inferior wall was noted. Doppler parameters were consistent with abnormal left ventricular  relaxation (grade 1 diastolic dysfunction).  strong family h/o of premature CAD. Her father suffered a MI at age 46. She has a 30+ year h/o tobacco abuse, smoking on average 1ppd. She has h/o untreated HTN and HLD.   Principal Problem:   Acute pancreatitis Active Problems:   Hypertension   GERD (gastroesophageal reflux disease)   Hypokalemia   Leukocytosis, unspecified   Alcohol abuse   SIRS (systemic inflammatory response syndrome)   Acute systolic CHF (congestive heart failure)   Tobacco abuse   Family history of premature CAD   Acute respiratory failure   Angina, class II   Plan:   SP  R/L HC revealing severe ischemic cardiomyopathy with chronically occluded LAD, moderate disease in a large OM 2 and severe disease and small posterior lateral vessel. Severely reduced cardiac output and index, LVEDP and wedge pressure of 10-12 mmHg.  The LAD does not appear to be graftable.  Per Dr. Ellyn Hack: could consider PCI OM 2, however the posterior lateral vessel is probably not likely to be PCI amenable.   ASA, coreg 6.25, lisinopril 5. HLD: lipitor.  Lasix 40mg  IV for acute CHF.  BP and HR stable.    Net fluids; -0.1L/-2.3L.  Recommend changing to PO lasix.  Low sodium diet and daily weight monitoring required.      LOS: 5 days    Tarri Fuller PA-C 05/11/2013 5:59 AM

## 2013-05-11 NOTE — Care Management Note (Addendum)
Page 2 of 3   05/17/2013     3:49:46 PM CARE MANAGEMENT NOTE 05/17/2013  Patient:  [name], [name]   Account Number:  [id number]  Date Initiated:  05/10/2013  Documentation initiated by:  Marvetta Gibbons  Subjective/Objective Assessment:   Pt admitted with n/v- pancreatitis, CHF     Action/Plan:   PTA pt lived at home-- NCM to follow for d/c needs   Anticipated DC Date:  05/12/2013   Anticipated DC Plan:  HOME/SELF CARE  In-house referral  Clinical Social Worker      DC Planning Services  CM consult      Choice offered to / List presented to:             Status of service:  Completed, signed off Medicare Important Message given?   (If response is "NO", the following Medicare IM given date fields will be blank) Date Medicare IM given:   Date Additional Medicare IM given:    Discharge Disposition:  [place]  Per UR Regulation:  Reviewed for med. necessity/level of care/duration of stay  If discussed at [place] of Stay Meetings, dates discussed:   05/11/2013    Comments:  05/15/2013 D/c to [facility] 05/15/2013 via [place], BSN, [place], CCM 05/17/2013  05/14/2013 CM f/u with SW for update on placement per MD/Dr. Eliseo Squires request. SW actively seeking placement and confirms work flow procedures needed prior to consulting with Dr. Reynaldo Minium. Dr. Eliseo Squires confirms HF Clinic f/u appt set for 05/20/13 PCP [facility] appts and [facility] Card application appt may need to be rescheduled if patient d/c to SNF. Houda Brau Hutchinosn RN, BSN, [place], CCM  05/14/2013 SW referral to [facility] made 05/13/2013 following [facility], Dr. Jacquiline Doe recommendations following LOS for SNF with 2-4 weeks LOG.  CM provided update to SW that if issues locating facility to contact Dr. Reynaldo Minium. SW active with placement needs. CM met with patient 05/13/2013 to discuss this option. Patient stated she was very interested and appreciative of what was being done to help her.   Stated desire to stop drinking "because if I don't, it is going to kill me." Patient states husband has been drinking and unable to visit her inpatient.  Patient admits to desire to remove herself from this setting with ETOH husband but had no where else to go.  States desired change in her life but no resources to help make this happen. CM notified SW of needed intervention for ETOH Support therapy and tx as well as referral to [facility] in [place], [place]. CM spoke with Dr. Eliseo Squires on 05/13/2013 who agreed with d/c plan. Hamad Whyte RN, BSN, [employer], CCM 05/14/2013  05/11/2013  CM Consult Note: Social:  From home (hotel room) with husband.  Rent $525.00 / month. Hotel Room Phone #:  [phone] Ext [phone] PCG:  Husband/Jeff Lewis (patient elected not to take married name) [phone].  Husband is not working but going to school on a [employer]. [address] [place], [place] [address] [phone] Transportation:  Public Transportation PRN but only with husband at side.  States memory is poor d/t past hx/o stroke and gets lost easily.  Husband only has a Company secretary d/t loss of Drivers License.  Patient's Drivers License expired this year and has not attempted to renew. Other Resources:  Food Stamps Health Care Coverage:  Self Pay - Hx/o last MCD appliacation approximatley 2007.  Last Disability application was 06/2701 and denied. PCP:  None  since 2007 Medications:  States stopped taking a long time ago d/t cost. Mobility:  Weakness Left side d/t old stroke.  Ambulates with no assistive devices but states little room in the hotel room to walk and neighborhood is not safe enough to walk outside. ETOH Hx:  yes CM provided VE on importance of keeping MD appt's, PC Services, and taking medications as prescribed. CM provided VE on increased risk of not taking meds to manage health conditions. CM provided VE on use of $4.00 med list and provided copy CM provided VE  on plan of care:   NEW PCP appt at the [facility] - appt 4/29 4:00pm with Chari Manning, NP [facility] Card application:  May 5th 1:17BV  CM has also provided hard copy of application. CM requested MD Rx meds from $3.00 Med list if at all possible. CM will provide MED MATCH pending review of meds at d/c SW referral:  Transportation at d/c for home and pharmcy pick up. PT Eval pending - however, no HH PT options for PT with Self Pay. [facility]  Referral and consult call to contact:  Toniann Ket [phone]:  Appt scheduled for 1:00 tomorrow prior to d/c to initated MCD application and Disability application.  Patient will remain in MCD Pending status for approximately 90 days. THN:  Referral for eligibiltiy Dispositon Plan: HHS:  RN, SW Disease MGMT MEdication Reconciliation Monitor compliance with Disease MGMT, Medications, and keeping all appts SW to follow continued MCD application status and assess for other comuntiy resources and transportation services. Brittiny Levitz RN, BSN, [place], [place] 05/11/2013

## 2013-05-11 NOTE — Progress Notes (Signed)
CARDIAC REHAB PHASE I   PRE:  Rate/Rhythm: 76 SR    BP: sitting 103/67    SaO2:   MODE:  Ambulation: 140 ft   POST:  Rate/Rhythm: 94 SR    BP: sitting 107/61     SaO2:   Pt groggy. Had trouble getting out of bed. Very unsteady on her feet. Used gait belt and held pts hand. She held to railing at times. Slow, small steps. Denied SOB, HR and BP stable. Reported dizziness upon entering her room when she stumbled slightly. To recliner with legs elevated. Gave HF booklet to begin reading. Pt does not have scales at home and will need assistance with getting some. Instructed pt not to get up. Still has foley. [phone]   [place], ACSM 05/11/2013 10:52 AM

## 2013-05-11 NOTE — Patient Care Conference (Signed)
Site area: right and left groin  Site Prior to Removal:  Level  0 Pressure Applied For 20 MINUTES    Minutes Beginning at 2207  Manual:   yes  Patient Status During Pull:  stable  Post Pull Groin Site:  Level 0  Post Pull Instructions Given:  yes  Post Pull Pulses Present:  yes  Dressing Applied:  no  Comments:

## 2013-05-11 NOTE — Progress Notes (Addendum)
[facility] PROGRESS NOTE  Chistina Roston FGH:[id number] DOB: 1955/04/10 DOA: 05/06/2013 PCP: No PCP Per Patient  HPI Arnika Larzelere is a 58 y.o. female with PMH significant for pancreatitis, alcohol abuse/binge drinking and other medical problems as listed below who presented to the ED with abd pain and N/V. She stated that she began having severe abd pain with associated N/V at about 12:30 AM 05/06/13. The emesis was non bloody and she states that pain was upper abdominal-more on L. side. She related that her last drink was Tues and that she usually drinks liquor but states she cannot quantify. In the ED her alcohol level was <11, lipase in 2700s and k,3.2 . She was treated with multiple doses of analgesics and was admitted for further eval and management.  Subjective: Cardiac cath done and no stent placed. Cardiology recommended medical management.  Assessment/Plan:  Principal Problem:   Acute pancreatitis Active Problems:   Hypertension   GERD (gastroesophageal reflux disease)   Hypokalemia   Leukocytosis, unspecified   Alcohol abuse   SIRS (systemic inflammatory response syndrome)   Acute systolic CHF (congestive heart failure)   Tobacco abuse   Family history of premature CAD   Acute respiratory failure   Angina, class II   Acute Pancreatitis with previous history of same. -Secondary to alcohol (Lipase initially 2791-now 105, abd pain, N/V) -NPO, antiemetics, pain management with Dilaudid (decreased dose). -Counseled extensively about alcohol. -Abdominal pain improved, advanced today to heart healthy diet.  Acute systolic CHF -2-D echocardiogram showed LVEF of 20-25%. -Chest x-ray showed vascular congestion and no small pleural effusion. -Started on IV Lasix, Coreg, check chest x-ray in a.m. -Cardiology did cardiac cath showed CAD but recommended medical therapy. -Cardiology recommended to evaluate for AICD if LVEF remains <30% in 90 days with optimized medical  therapy.  Acute Hypoxic Respiratory Failure -Oxygen sats in the 60s momentarily, rebounded to the low 80s on 4L -Multifactorial, likely because of undiagnosed COPD, acute CHF and decreased respiratory drive secondary to benzos and narcotics. -Cardiomegaly and pleural effusion noted on xray 4/17 -Critical Care Consultation appreciated. -Acute CHF is probably playing a big role on her respiratory failure.  Hypertension - vacillating SBP 130s to 90s  -No meds outpt, monitor and treat accordingly  -Currently hypotensive  ETOH abuse -Reported recent ETOH intake, counseled to quit drinking.  -ETOH <11 on 05/06/13 -Monitor for possible withdrawal.  CIWA protocol ordered.  GERD (gastroesophageal reflux disease)  -Continue PPI therapy   Hypokalemia/Hyperkalemia  -Replaced K, no GU to aggressive diuresis -Replete with oral supplements.  Leukocytosis, unspecified  -Likely d/t inflammation -urine culture pending, monitor    Code Status: Full Family Communication: No one at bedside Disposition Plan: Transferred to [facility] unit   Consultants:  Critical Care.  Cardiology  Antibiotics:  None at this time    Objective: Filed Vitals:   05/11/13 0829  BP: 116/74  Pulse: 88  Temp: 98.3 F (36.8 C)  Resp: 18    Intake/Output Summary (Last 24 hours) at 05/11/13 1438 Last data filed at 05/11/13 0600  Gross per 24 hour  Intake  256.8 ml  Output    650 ml  Net -393.2 ml   Filed Weights   05/09/13 0426 05/10/13 0415 05/11/13 0600  Weight: 65.6 kg (144 lb 10 oz) 64.8 kg (142 lb 13.7 oz) 64.2 kg (141 lb 8.6 oz)    Exam:  Physical Exam  Constitutional:  Unconscious, across the bed  HENT:  Head: Normocephalic and atraumatic.  Eyes: Right  pupil is not reactive. Left pupil is not reactive.  Pupils were ~size 2 and nonreactive to light  Cardiovascular: Normal heart sounds and intact distal pulses.   Respiratory: Bradypnea noted. She has decreased breath sounds.   Respirations were shallow and slow, minor expiratory wheezing noted  GI: She exhibits no distension and no mass.  Post Narcan pt c/o abd tenderness to palpation: LUQ and epigastric region  Neurological: She is unresponsive. GCS eye subscore is 1. GCS verbal subscore is 1. GCS motor subscore is 1.  Initially pt was unable to be awakened but with further stimulation she would startle awake then fall back into a stupor.  Skin: There is cyanosis. There is pallor. Nails show clubbing.  Hands were cool, pale/cyanotic    Data Reviewed: Basic Metabolic Panel:  Recent Labs Lab 05/07/13 1800 05/08/13 0436 05/09/13 0340 05/10/13 0249 05/11/13 0336  NA 145 145 135* 138 137  K 4.2 3.7 3.2* 3.1* 3.9  CL 109 106 95* 95* 102  CO2 19 25 20 28 23   GLUCOSE 91 91 72 128* 109*  BUN 13 11 14 16 17   CREATININE 0.93 0.92 0.83 0.94 0.78  CALCIUM 9.0 8.6 8.4 8.7 8.8  MG  --   --   --  2.2  --    Liver Function Tests:  Recent Labs Lab 05/06/13 0424 05/08/13 0436 05/09/13 0340 05/10/13 0249  AST 41* 36 34 23  ALT 31 19 16 13   ALKPHOS 113 92 87 87  BILITOT 0.2* 0.4 0.7 0.7  PROT 7.4 6.7 6.6 6.6  ALBUMIN 3.8 2.9* 2.5* 2.5*    Recent Labs Lab 05/06/13 0424 05/07/13 0705 05/08/13 0436  LIPASE 2791* 702* 105*   CBC:  Recent Labs Lab 05/06/13 0424 05/07/13 0955 05/08/13 0436 05/09/13 0340 05/10/13 0249  WBC 13.4* 20.1* 15.1* 9.7 8.7  NEUTROABS 11.2*  --   --   --   --   HGB 16.7* 14.7 14.9 15.9* 15.2*  HCT 48.2* 46.0 45.9 46.8* 44.9  MCV 97.0 104.1* 103.1* 98.3 98.2  PLT 282 163 154 PLATELET CLUMPS NOTED ON SMEAR, COUNT APPEARS ADEQUATE 176    Studies: Dg Chest Port 1 View  05/10/2013   CLINICAL DATA:  Fluid overload.  EXAM: PORTABLE CHEST - 1 VIEW  COMPARISON:  05/07/2013  FINDINGS: Cardiac silhouette remains mildly enlarged, unchanged. The patient has taken a greater inspiration than on the prior study, and there is improved aeration of the lung bases. No airspace  consolidation, pulmonary edema, pneumothorax, or definite pleural effusion is identified. There is mild elevation of the right hemidiaphragm.  IMPRESSION: Improved aeration of the lung bases. No evidence of new airspace disease.   Electronically Signed   By: Logan Bores   On: 05/10/2013 08:01    Scheduled Meds: . aspirin  81 mg Oral Daily  . atorvastatin  40 mg Oral q1800  . carvedilol  6.25 mg Oral BID WC  . enoxaparin (LOVENOX) injection  40 mg Subcutaneous Q24H  . folic acid  1 mg Oral Daily  . furosemide  40 mg Intravenous Daily  . lisinopril  5 mg Oral Daily  . multivitamin with minerals  1 tablet Oral Daily  . pantoprazole  40 mg Oral BID AC  . thiamine  100 mg Oral Daily   Continuous Infusions:    Principal Problem:   Acute pancreatitis Active Problems:   Hypertension   GERD (gastroesophageal reflux disease)   Hypokalemia   Leukocytosis, unspecified   Alcohol abuse  SIRS (systemic inflammatory response syndrome)   Acute systolic CHF (congestive heart failure)   Tobacco abuse   Family history of premature CAD   Acute respiratory failure   Angina, class II    Time spent: 35 minutes  Verlee Monte, MD [phone]  [facility]  If 7PM-7AM, please contact night-coverage at [email or web address], password [facility] 05/11/2013, 2:38 PM  LOS: 5 days

## 2013-05-11 NOTE — Progress Notes (Signed)
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 She needs heart failure therapy, ETOH discontinuation and social services help. Plan:  1. Phase 1 cardiac rehab 2. Titrate heart failure therapy 3. Social services consult 4. ETOH program 5. Consideration of AICD if LV function remains low after 90 days of optimized medical therapy

## 2013-05-11 NOTE — Consult Note (Addendum)
Heart Failure Navigator Consult Note  Presentation: [name] The patient is a 58 y/o female, with no prior cardiac history, who was admitted by [facility] on 05/06/13 for pancreatitis. Her w/u included a 2D echo which demonstrated severely reduced systolic function with an EF of 20-25%. Diffuse hypokinesis with akinesis of the basal inferior wall was noted. Doppler parameters were consistent with abnormal left ventricular relaxation (grade 1 diastolic dysfunction). We were consulted for further management.  She has no prior cardiac history, but she has multiple risk factors for coronary disease. She has a strong family h/o of premature CAD. Her father suffered a MI at age 30. She has a 30+ year h/o tobacco abuse, smoking on average 1ppd. She has h/o untreated HTN and HLD. She has not been followed by a physician in over 2 years and has been off of all medications since that time. She is a former patient of the [facility], but did not established care elsewhere after they closed. She has no insurance. She had a CVA in 2009 which left her with residual left sided weakness. She also has a h/o chronic alcohol abuse.   She notes chest pain symptoms x 2 months that are concerning for angina. She notes substernal chest pressure/ heaviness that occurs both at rest and with exertion. Exacerbated by cold temperatures. She also notes recent history of DOE, orthopnea and PND.   Past Medical History  Diagnosis Date  . Diverticulosis   . Pancreatitis   . Hypertension   . Stroke   . Hyperlipidemia   . Shortness of breath   . Anxiety   . GERD (gastroesophageal reflux disease)   . ETOH abuse     does not drink every day     History   Social History  . Marital Status: Married    Spouse Name: N/A    Number of Children: N/A  . Years of Education: N/A   Social History Main Topics  . Smoking status: Current Every Day Smoker -- 1.00 packs/day for 40 years    Types: Cigarettes  . Smokeless tobacco:  Never Used  . Alcohol Use: Yes     Comment: 2 3 TIMES A WEEK  . Drug Use: No  . Sexual Activity: None   Other Topics Concern  . None   Social History Narrative  . None    ECHO:Study Conclusions--05/08/13  - Left ventricle: The cavity size was mildly dilated. Wall thickness was normal. Systolic function was severely reduced. The estimated ejection fraction was in the range of 20% to 25%. Diffuse hypokinesis with akinesis of the basal inferior wall. Doppler parameters are consistent with abnormal left ventricular relaxation (grade 1 diastolic dysfunction). - Ventricular septum: Septal motion showed abnormal function and dyssynergy. - Aortic valve: Mildly calcified annulus. Trileaflet. No significant regurgitation. - Mitral valve: Mildly calcified annulus. Trivial regurgitation. - Right atrium: Central venous pressure: 10mm Hg (est). - Tricuspid valve: Trivial regurgitation. - Pulmonary arteries: Systolic pressure could not be accurately estimated. - Pericardium, extracardiac: A trivial pericardial effusion was identified. Impressions:  - Mild LV chamber dilatation with normal wall thickness and LVEF 20-25%, diffuse hypokinesis with some regionality, grade 1 diastolic dysfunction. No major regurgitant valvular lesions. Unable to assess PASP. Trivial pericardial effusion.   BNP    Component Value Date/Time   PROBNP 8584.0* 05/09/2013 0340    Education Assessment and Provision:  Detailed education and instructions provided on heart failure disease management including the following:  Signs and symptoms of Heart Failure  When to call the physician Importance of daily weights Low sodium diet  Fluid restriction Medication management Anticipated future follow-up appointments  Patient education given on each of the above topics.  Patient acknowledges understanding and acceptance of all instructions.  I spoke with [name] regarding her new diagnosis of HF.  She does  not currently have a PCP.  She has no insurance and she and her husband are living in a hotel and have no transportation.  She cannot currently read any written material provided as she says her husband needs to "bring her glasses".  I reviewed HF information detailed above and I will provide her a scale for use upon discharge as she says that she cannot afford to purchase one.  Education Materials:  "Living Better With Heart Failure" Booklet, Daily Weight Tracker Tool and Heart Failure Educational Video.   High Risk Criteria for Readmission and/or Poor Patient Outcomes:  (Recommend Follow-up with Advanced Heart Failure Clinic)--Yes   EF <30%- Yes--20-25%  2 or more admissions in 6 months- No  Difficult social situation- Yes- Limited finances, living in a hotel and no insurance or transportation for follow up appts.   Demonstrates medication noncompliance- No-However says that she and her husband have no job and will not be able to afford any medications on discharge.   Barriers of Care:  Knowledge of medical condition and financial situation  Discharge Planning:   Plans to discharge to home with husband and cats.  I will work on a Financial planner.  She currently has no PCP.

## 2013-05-12 DIAGNOSIS — I2510 Atherosclerotic heart disease of native coronary artery without angina pectoris: Secondary | ICD-10-CM

## 2013-05-12 DIAGNOSIS — F1010 Alcohol abuse, uncomplicated: Secondary | ICD-10-CM

## 2013-05-12 LAB — BASIC METABOLIC PANEL
BUN: 18 mg/dL (ref 6–23)
CHLORIDE: 100 meq/L (ref 96–112)
CO2: 24 meq/L (ref 19–32)
CREATININE: 0.91 mg/dL (ref 0.50–1.10)
Calcium: 8.5 mg/dL (ref 8.4–10.5)
GFR calc non Af Amer: 68 mL/min — ABNORMAL LOW (ref >90)
GFR, EST AFRICAN AMERICAN: 79 mL/min — AB (ref >90)
GLUCOSE: 94 mg/dL (ref 70–99)
POTASSIUM: 3.2 meq/L — AB (ref 3.7–5.3)
Sodium: 138 mEq/L (ref 137–147)

## 2013-05-12 MED ORDER — CARVEDILOL 3.125 MG PO TABS
3.1250 mg | ORAL_TABLET | Freq: Two times a day (BID) | ORAL | Status: DC
  Administered 2013-05-12 – 2013-05-13 (×2): 3.125 mg via ORAL
  Filled 2013-05-12 (×2): qty 1

## 2013-05-12 MED ORDER — BOOST / RESOURCE BREEZE PO LIQD
1.0000 | Freq: Three times a day (TID) | ORAL | Status: DC
  Administered 2013-05-12 – 2013-05-15 (×7): 1 via ORAL
  Filled 2013-05-12 (×15): qty 1

## 2013-05-12 MED ORDER — POTASSIUM CHLORIDE CRYS ER 20 MEQ PO TBCR
40.0000 meq | EXTENDED_RELEASE_TABLET | Freq: Once | ORAL | Status: AC
  Administered 2013-05-12: 10:00:00 40 meq via ORAL
  Filled 2013-05-12: qty 2

## 2013-05-12 MED ORDER — DIGOXIN 125 MCG PO TABS
0.1250 mg | ORAL_TABLET | Freq: Every day | ORAL | Status: DC
  Administered 2013-05-12 – 2013-05-15 (×4): 0.125 mg via ORAL
  Filled 2013-05-12 (×4): qty 1

## 2013-05-12 NOTE — Progress Notes (Signed)
SBP dropped to 70's last night and MD notified 250cc IVF given SBP runs soft after that. Patient only complaint was feeling weak.

## 2013-05-12 NOTE — Progress Notes (Signed)
CARDIAC REHAB PHASE I   PRE:  Rate/Rhythm: 85 SR  BP:  Supine:   Sitting:   Standing:    SaO2:   Up in room from bathroom  MODE:  Ambulation: 300 ft   POST:  Rate/Rhythm: 92 SR  BP:  Supine:   Sitting: 97/63  Standing:    SaO2: 89-91%RA 1129-1208 Pt walked 300 ft on RA with rolling walker and minimal asst. Tired by end of walk. Pt able to answer teachback questions re 2000 mg sodium restriction and weight gain. Discussed fluid restriction. Gave smoking cessation handouts. Pt hoping to be able to quit. Encouraged her to call [phone] as needed. Pt limited in mobility so did not give ex ed. Encouraged her to walk as tolerated increasing slowly. Pt stated we were getting her a rolling walker. Discussed abstaining from alcohol. Pt stated she drank 3 to 4 drinks a day and her husband drinks constantly. She is afraid he will not cut down. She said takes edge off to take a few drinks and be around his drinking.  She is hoping he will cut back. Emotional support given.   Graylon Good, RN BSN  05/12/2013 12:03 PM

## 2013-05-12 NOTE — Evaluation (Signed)
Physical Therapy Evaluation Patient Details Name: [name] MRN: [id number] DOB: 08-Sep-1955 Today's Date: 05/12/2013   History of Present Illness   [name] is a 58 y.o. female with PMH significant for pancreatitis, alcohol abuse/binge drinking and other medical problems as listed below who presents with the above complaints. She states that she began having severe abd pain with associated N/V at about 12:30 AM. The emesis was non bloody and she states that pain was upper abdominal-more on L. Side. She relates that her last drink was Tues and that she usually drinks liquor but states she cannot quantify. In the ED her alcohol level was <11, lipase in 2700s and k,3.2 . She was treated with multiple doses of analgesics and is admitted for further eval and management. Her w/u included a 2D echo which demonstrated severely reduced systolic function with an EF of 20-25%. Diffuse hypokinesis with akinesis of the basal inferior wall was noted. Doppler parameters were consistent with abnormal left ventricular relaxation (grade 1 diastolic dysfunction).  strong family h/o of premature CAD. Her father suffered a MI at age 38. She has a 30+ year h/o tobacco abuse, smoking on average 1ppd. She has h/o untreated HTN and HLD.  Clinical Impression   Pt admitted with above. Pt currently with functional limitations due to the deficits listed below (see PT Problem List).  Pt will benefit from skilled PT to increase their independence and safety with mobility to allow discharge to the venue listed below.       Follow Up Recommendations Home health PT;Supervision/Assistance - 24 hour (Reliable 24 hour assist would be optimal, however I'm not sure how feasible this is)    Equipment Recommendations  Rolling walker with 5" wheels;3in1 (PT)    Recommendations for Other Services       Precautions / Restrictions Precautions Precautions: Fall Precaution Comments: Fall risk reduced with use of RW       Mobility  Bed Mobility                  Transfers Overall transfer level: Needs assistance Equipment used: None Transfers: Sit to/from Stand Sit to Stand: Min guard         General transfer comment: Cues for hand placement and control; Noted use of RUE push for steadiness and successful sit to stand; cues to control descent with stand to sit  Ambulation/Gait Ambulation/Gait assistance: Min guard;Min assist Ambulation Distance (Feet): 200 Feet (greater than) Assistive device: 1 person hand held assist;Straight cane;Rolling walker (2 wheeled) Gait Pattern/deviations: Decreased stride length Gait velocity: slowed Gait velocity interpretation: Below normal speed for age/gender General Gait Details: Initially ambulated with handheld assist, with noted tendency to reach out for UE support secondary to decr balance; Then walked with single point cane; cues for technique; Noed increased unsteadiness with incr amb distance and fatigue, with one loss of balance (with cane) requiring min assist to regain footing; finished last 40 ft of amb with RW; cues for RW proximity and posture -- at this point will recommend RW for steadiness with amb  Stairs            Wheelchair Mobility    Modified Rankin (Stroke Patients Only)       Balance Overall balance assessment: Needs assistance         Standing balance support: Single extremity supported;Bilateral upper extremity supported;No upper extremity supported Standing balance-Leahy Scale: Fair Standing balance comment: Standing balance worsens with fatigue  Standardized Balance Assessment Standardized Balance Assessment :  (Consider BERG next session)           Pertinent Vitals/Pain HR 84 initially HR 95 with amb HR back to 85 with seated rest    Home Living Family/patient expects to be discharged to:: Private residence Living Arrangements: Spouse/significant other Available Help at  Discharge: Available 24 hours/day;Other (Comment) (Pt reports spouse is unreliable; drinks) Type of Home: Other(Comment) (Motel room) Home Access: Level entry;Other (comment) (?curb)     Home Layout: One level Home Equipment: None Additional Comments: Reports she has rigged up her own kitchen    Prior Function Level of Independence: Independent               Hand Dominance   Dominant Hand: Right    Extremity/Trunk Assessment   Upper Extremity Assessment: Overall WFL for tasks assessed (Decr coordination and generally weak LUE, but WFL for simple mobility tasks)           Lower Extremity Assessment: Generalized weakness         Communication   Communication: No difficulties  Cognition Arousal/Alertness: Awake/alert Behavior During Therapy: WFL for tasks assessed/performed Overall Cognitive Status: Within Functional Limits for tasks assessed                      General Comments      Exercises        Assessment/Plan    PT Assessment Patient needs continued PT services  PT Diagnosis Difficulty walking;Generalized weakness   PT Problem List Decreased strength;Decreased balance;Decreased activity tolerance;Decreased knowledge of use of DME  PT Treatment Interventions DME instruction;Gait training;Stair training;Functional mobility training;Therapeutic activities;Therapeutic exercise;Balance training;Patient/family education   PT Goals (Current goals can be found in the Care Plan section) Acute Rehab PT Goals Patient Stated Goal: did not state, but willing to walk PT Goal Formulation: With patient Time For Goal Achievement: 05/19/13 Potential to Achieve Goals: Good    Frequency Min 3X/week   Barriers to discharge Decreased caregiver support Pt's substance abuse issues also compromise her safety    Co-evaluation               End of Session Equipment Utilized During Treatment: Gait belt Activity Tolerance: Patient tolerated treatment  well Patient left: in chair;with call bell/phone within reach Nurse Communication: Mobility status         Time: 7017-7939 PT Time Calculation (min): 20 min   Charges:   PT Evaluation $Initial PT Evaluation Tier I: 1 Procedure PT Treatments $Gait Training: 8-22 mins   PT G Codes:          [facility] 05/12/2013, 9:50 AM Roney Marion, [place] Pager [phone] Office [phone]

## 2013-05-12 NOTE — Progress Notes (Signed)
[facility] PROGRESS NOTE  [name] AYT:[id number] DOB: 1955-03-24 DOA: 05/06/2013 PCP: No PCP Per Patient  summary [name] is a 58 y.o. female with PMH significant for pancreatitis, alcohol abuse problems as listed below who presented to the ED with abd pain and N/V. She stated that she began having severe abd pain with associated N/V at about 12:30 AM 05/06/13. She related that her last drink was Tues and that she usually drinks liquor but states she cannot quantify. In the ED her alcohol level was <11, lipase in 2700s and k,3.2 . She was treated with multiple doses of analgesics and was admitted for further eval and management.  Had acute respiratory failure, multifactorial: opiates, acute pulmonary edema, COPD, requiring transfer to SDU.  Echo shows EF 35-57% and diastolic dysfuncion, likely ischemic cardiomyopathy possibly alcohol contributing. Cath showed chronically occluded LAD, moderate disease in a large OM 2 and severe disease and small posterior lateral vessel. Medical Rx recommended  Subjective: Some nausea. Not eating much. No dyspnea at rest. Some dyspnea with exertion.  Assessment/Plan:  Principal Problem:   Acute pancreatitis Active Problems:   Hypertension   GERD (gastroesophageal reflux disease)   Hypokalemia   Leukocytosis, unspecified   Alcohol abuse   SIRS (systemic inflammatory response syndrome)   Acute systolic CHF (congestive heart failure)   Tobacco abuse   Family history of premature CAD   Acute respiratory failure   Angina, class II   Acute Pancreatitis with previous history of same. -Secondary to alcohol (Lipase initially 2791-now 105, abd pain, N/V) On heart healthy diet, but not eating much. Add resource breeze.  Wishes to quit EtOH. Had been sober 15 years in the past, but husband drinks heavily  Acute systolic CHF -2-D echocardiogram showed LVEF of 20-25%. Dropped BP last night requiring NS 250 cc bolus. Cardiology adjusting  meds -Cardiology did cardiac cath showed CAD but recommended medical therapy. -Cardiology recommended to evaluate for AICD if LVEF remains <30% in 90 days with optimized medical therapy.  Acute Hypoxic Respiratory Failure -Oxygen sats in the 60s momentarily, rebounded to the low 80s on 4L -Multifactorial, likely because of undiagnosed COPD, acute CHF and decreased respiratory drive secondary to benzos and narcotics. -Cardiomegaly and pleural effusion noted on xray PCCM signed off  Hypotension: none further  ETOH abuse See above. No signs of withdrawal  GERD (gastroesophageal reflux disease)    Hypokalemia/Hyperkalemia  Replete  Leukocytosis, unspecified  Secondary to pancreatitis, resolved  CAD: medical management chronically occluded LAD, moderate disease in a large OM 2 and severe disease and small posterior lateral vessel.  Code Status: Full Family Communication: No one at bedside Disposition Plan: home 1-2 days   Consultants:  Critical Care.  Cardiology  Antibiotics:  None at this time  Objective: Filed Vitals:   05/12/13 1207  BP: 109/60  Pulse: 67  Temp: 98.4 F (36.9 C)  Resp: 18    Intake/Output Summary (Last 24 hours) at 05/12/13 1357 Last data filed at 05/12/13 3220  Gross per 24 hour  Intake    530 ml  Output    550 ml  Net    -20 ml   Filed Weights   05/10/13 0415 05/11/13 0600 05/12/13 0100  Weight: 64.8 kg (142 lb 13.7 oz) 64.2 kg (141 lb 8.6 oz) 62 kg (136 lb 11 oz)    Exam:  Physical Exam   Gen: alert, oriented. Lungs CTA without WRR CV RRR without MGR Abd: s, mild epigastric tenderness, nd Ext no cce  Psych: calm, cooperative, normal affect.  Data Reviewed: Basic Metabolic Panel:  Recent Labs Lab 05/08/13 0436 05/09/13 0340 05/10/13 0249 05/11/13 0336 05/12/13 0500  NA 145 135* 138 137 138  K 3.7 3.2* 3.1* 3.9 3.2*  CL 106 95* 95* 102 100  CO2 25 20 28 23 24   GLUCOSE 91 72 128* 109* 94  BUN 11 14 16 17 18    CREATININE 0.92 0.83 0.94 0.78 0.91  CALCIUM 8.6 8.4 8.7 8.8 8.5  MG  --   --  2.2  --   --    Liver Function Tests:  Recent Labs Lab 05/06/13 0424 05/08/13 0436 05/09/13 0340 05/10/13 0249  AST 41* 36 34 23  ALT 31 19 16 13   ALKPHOS 113 92 87 87  BILITOT 0.2* 0.4 0.7 0.7  PROT 7.4 6.7 6.6 6.6  ALBUMIN 3.8 2.9* 2.5* 2.5*    Recent Labs Lab 05/06/13 0424 05/07/13 0705 05/08/13 0436  LIPASE 2791* 702* 105*   CBC:  Recent Labs Lab 05/06/13 0424 05/07/13 0955 05/08/13 0436 05/09/13 0340 05/10/13 0249  WBC 13.4* 20.1* 15.1* 9.7 8.7  NEUTROABS 11.2*  --   --   --   --   HGB 16.7* 14.7 14.9 15.9* 15.2*  HCT 48.2* 46.0 45.9 46.8* 44.9  MCV 97.0 104.1* 103.1* 98.3 98.2  PLT 282 163 154 PLATELET CLUMPS NOTED ON SMEAR, COUNT APPEARS ADEQUATE 176    Studies: No results found.  Scheduled Meds: . aspirin  81 mg Oral Daily  . atorvastatin  40 mg Oral q1800  . carvedilol  3.125 mg Oral BID WC  . digoxin  0.125 mg Oral Daily  . enoxaparin (LOVENOX) injection  40 mg Subcutaneous Q24H  . feeding supplement (RESOURCE BREEZE)  1 Container Oral TID BM  . folic acid  1 mg Oral Daily  . lisinopril  5 mg Oral Daily  . multivitamin with minerals  1 tablet Oral Daily  . pantoprazole  40 mg Oral BID AC  . thiamine  100 mg Oral Daily   Continuous Infusions:    Time spent: 25 minutes  Delfina Redwood, MD [phone] [facility]  If 7PM-7AM, please contact night-coverage at [email or web address], password [facility] 05/12/2013, 1:57 PM  LOS: 6 days

## 2013-05-12 NOTE — Plan of Care (Signed)
Problem: Food- and Nutrition-Related Knowledge Deficit (NB-1.1) Goal: Nutrition education Formal process to instruct or train a patient/client in a skill or to impart knowledge to help patients/clients voluntarily manage or modify food choices and eating behavior to maintain or improve health. Outcome: Completed/Met Date Met:  05/12/13 Nutrition Education Note  RD consulted for nutrition education regarding new onset CHF.  Pt lives with husband in a hotel. Pt drinks 3-4 ETOH drinks daily (vodka and soda).  Pt is denies recent weight loss and reports good appetite PTA. Pt eats 2 meals per day. Pt and husband does the cooking.  Per pt she has had some nausea today which limited her intake at Breakfast.   RD provided "Low Sodium Nutrition Therapy" handout from the Academy of [employer] Dietetics. Reviewed patient's dietary recall. Provided examples on ways to decrease sodium intake in diet. Discouraged intake of processed foods and use of salt shaker. Encouraged fresh fruits and vegetables as well as whole grain sources of carbohydrates to maximize fiber intake.   RD discussed why it is important for patient to adhere to diet recommendations, and emphasized the role of fluids, foods to avoid, and importance of weighing self daily. Teach back method used.  Expect fair compliance.  Body mass index is 24.22 kg/(m^2).   Current diet order is Heart Healthy, patient is consuming approximately 25% of meals at this time. Labs and medications reviewed. No further nutrition interventions warranted at this time. RD contact information provided. If additional nutrition issues arise, please re-consult RD.   [place], [place], [place] Pager [phone] After Hours Pager

## 2013-05-12 NOTE — Progress Notes (Signed)
Advanced Heart Failure Rounding Note   Subjective:   [name] is a 58 y/o female, with no prior cardiac history, who was admitted by [facility] on 05/06/13 for pancreatitis. Her w/u included a 2D echo which demonstrated severely reduced systolic function with an EF of 20-25%. Diffuse hypokinesis with akinesis of the basal inferior wall was noted. Doppler parameters were consistent with abnormal left ventricular relaxation (grade 1 diastolic dysfunction).   [name] has been diuresed with IV lasix and started on carvedilol and lisinopril. Yesterday [name] had RHC/LHC with low cardiac output and ICM-->chronically occluded LAD, moderate disease in a large OM 2 and severe disease and small posterior lateral vessel. Over night [name] was hypotensive and received 250 cc fluid bolus.   Exertional dyspnea. Denies orthopnea.       Objective:   Weight Range:  Vital Signs:   Temp:  [97.7 F (36.5 C)-98.9 F (37.2 C)] 98.1 F (36.7 C) (04/22 0731) Pulse Rate:  [68-88] 80 (04/22 0731) Resp:  [15-20] 16 (04/22 0731) BP: (71-119)/(35-74) 106/47 mmHg (04/22 0731) SpO2:  [92 %-97 %] 97 % (04/22 0731) Weight:  [136 lb 11 oz (62 kg)] 136 lb 11 oz (62 kg) (04/22 0100) Last BM Date: 05/09/13  Weight change: Filed Weights   05/10/13 0415 05/11/13 0600 05/12/13 0100  Weight: 142 lb 13.7 oz (64.8 kg) 141 lb 8.6 oz (64.2 kg) 136 lb 11 oz (62 kg)    Intake/Output:   Intake/Output Summary (Last 24 hours) at 05/12/13 0754 Last data filed at 05/12/13 0650  Gross per 24 hour  Intake    670 ml  Output   1350 ml  Net   -680 ml     Physical Exam: General:  Chronically ill appearing. No resp difficulty HEENT: normal Neck: supple. JVP 5-6 . Carotids 2+ bilat; no bruits. No lymphadenopathy or thryomegaly appreciated. Cor: PMI nondisplaced. Regular rate & rhythm. No rubs, gallops or murmurs. Lungs: clear Abdomen: soft, nontender, nondistended. No hepatosplenomegaly. No bruits or masses. Good bowel sounds. Extremities:  no cyanosis, clubbing, rash, edema Neuro: alert & orientedx3, cranial nerves grossly intact. moves all 4 extremities w/o difficulty. Affect pleasant  Telemetry: SR 80s  Labs: Basic Metabolic Panel:  Recent Labs Lab 05/08/13 0436 05/09/13 0340 05/10/13 0249 05/11/13 0336 05/12/13 0500  NA 145 135* 138 137 138  K 3.7 3.2* 3.1* 3.9 3.2*  CL 106 95* 95* 102 100  CO2 25 20 28 23 24   GLUCOSE 91 72 128* 109* 94  BUN 11 14 16 17 18   CREATININE 0.92 0.83 0.94 0.78 0.91  CALCIUM 8.6 8.4 8.7 8.8 8.5  MG  --   --  2.2  --   --     Liver Function Tests:  Recent Labs Lab 05/06/13 0424 05/08/13 0436 05/09/13 0340 05/10/13 0249  AST 41* 36 34 23  ALT 31 19 16 13   ALKPHOS 113 92 87 87  BILITOT 0.2* 0.4 0.7 0.7  PROT 7.4 6.7 6.6 6.6  ALBUMIN 3.8 2.9* 2.5* 2.5*    Recent Labs Lab 05/06/13 0424 05/07/13 0705 05/08/13 0436  LIPASE 2791* 702* 105*   No results found for this basename: AMMONIA,  in the last 168 hours  CBC:  Recent Labs Lab 05/06/13 0424 05/07/13 0955 05/08/13 0436 05/09/13 0340 05/10/13 0249  WBC 13.4* 20.1* 15.1* 9.7 8.7  NEUTROABS 11.2*  --   --   --   --   HGB 16.7* 14.7 14.9 15.9* 15.2*  HCT 48.2* 46.0 45.9 46.8* 44.9  MCV 97.0 104.1* 103.1* 98.3 98.2  PLT 282 163 154 PLATELET CLUMPS NOTED ON SMEAR, COUNT APPEARS ADEQUATE 176    Cardiac Enzymes: No results found for this basename: CKTOTAL, CKMB, CKMBINDEX, TROPONINI,  in the last 168 hours  BNP: BNP (last 3 results)  Recent Labs  05/09/13 0340  PROBNP 8584.0*     Other results:  EKG:   Imaging:  No results found.   Medications:     Scheduled Medications: . aspirin  81 mg Oral Daily  . atorvastatin  40 mg Oral q1800  . carvedilol  6.25 mg Oral BID WC  . enoxaparin (LOVENOX) injection  40 mg Subcutaneous Q24H  . folic acid  1 mg Oral Daily  . furosemide  40 mg Intravenous Daily  . lisinopril  5 mg Oral Daily  . multivitamin with minerals  1 tablet Oral Daily  .  pantoprazole  40 mg Oral BID AC  . thiamine  100 mg Oral Daily     Infusions:     PRN Medications:  acetaminophen, acetaminophen, bisacodyl, HYDROmorphone (DILAUDID) injection, levalbuterol, naLOXone (NARCAN)  injection, ondansetron (ZOFRAN) IV   Assessment/Plan:    1. Acute pancreatitis: ETOH-related. Symptoms have resolved, [name] has been restarted on a diet.  2. CAD: LHC---> chronically occluded LAD, moderate disease in a large OM 2 and severe disease and small posterior lateral vessel. - Continue beta blocker, add ASA 81 and statin.  3. New Acute systolic CHF: ICM per LHC .  EF 20-25% with diffuse hypokinesis with some regionality. ? Component of ETOH-related cardiomyopathy.  RHC/LHC  Low output noted on with chronically occluded LAD, moderate disease in a large OM 2 and severe disease and small posterior lateral vessel.  - Volume status low. Stop IV lasix.   - Cut back Coreg to 3.125 mg bid and add 0.125 mg digoxin daily, and continue lisinopril 5 mg daily. Renal function stable.  - Will not add spironolactone at this time due to concern for noncompliance.  4. Smoking: Active smoker, some of her dyspnea may be related to COPD. Counseled to quit.  5. ETOH abuse: Counseled to quit.  6. Malnutrition --Albumin 2.5. Referred to dietitian for HF education.  7. Social Issues. [name] does not have insurance or transportation to get follow up appointments. Lives in a [place]. Will set up follow up with HF SW in clinic and refer to paramedicine. Will provide HF meds via HF fund.    HF follow set up for 05/20/13 at 12:00  Amy D Clegg NP-C  8:27 AM  Patient seen with NP, agree with the above note.    Low output with optimized filling pressures on RHC yesterday, LHC with chronic LAD occlusion, does not have good revascularization options.  I would like to try to manage her without IV inotrope at this time.  - Add digoxin, cut back on Coreg to 3.125 mg bid and continue lisinopril.  -  Ambulate today, possibly home tomorrow.  Will likely start back on po Lasix tomorrow.  - Will need social work involvement given poor home situation.  - Strongly counseled to stop ETOH and smoking.  Suspect ETOH may be contributing to cardiomyopathy in addition to CAD.   Larey Dresser 05/12/2013 9:00 AM

## 2013-05-13 LAB — BASIC METABOLIC PANEL
BUN: 13 mg/dL (ref 6–23)
CALCIUM: 8.9 mg/dL (ref 8.4–10.5)
CHLORIDE: 101 meq/L (ref 96–112)
CO2: 21 mEq/L (ref 19–32)
Creatinine, Ser: 0.9 mg/dL (ref 0.50–1.10)
GFR calc Af Amer: 80 mL/min — ABNORMAL LOW (ref >90)
GFR calc non Af Amer: 69 mL/min — ABNORMAL LOW (ref >90)
GLUCOSE: 111 mg/dL — AB (ref 70–99)
Potassium: 3.6 mEq/L — ABNORMAL LOW (ref 3.7–5.3)
Sodium: 137 mEq/L (ref 137–147)

## 2013-05-13 MED ORDER — LISINOPRIL 5 MG PO TABS
5.0000 mg | ORAL_TABLET | Freq: Every day | ORAL | Status: DC
  Administered 2013-05-13 – 2013-05-14 (×2): 5 mg via ORAL
  Filled 2013-05-13 (×3): qty 1

## 2013-05-13 MED ORDER — POTASSIUM CHLORIDE CRYS ER 20 MEQ PO TBCR
40.0000 meq | EXTENDED_RELEASE_TABLET | Freq: Once | ORAL | Status: AC
  Administered 2013-05-13: 10:00:00 40 meq via ORAL
  Filled 2013-05-13: qty 2

## 2013-05-13 NOTE — Progress Notes (Signed)
1445 Cardiac Rehab Have attempted X 2 today to get pt to ambulate. She has c/o of dizziness and of diarrhea. Pt states that she feels bad and is unable to walk. We will follow tomorrow. Deon Pilling, RN 05/13/2013 2:49 PM

## 2013-05-13 NOTE — Clinical Social Work Placement (Addendum)
Clinical Social Work Department CLINICAL SOCIAL WORK PLACEMENT NOTE 05/13/2013  Patient:  [name], [name]  Account Number:  [id number] Admit date:  05/06/2013  Clinical Social Worker:  Crawford Givens, LCSW  Date/time:  05/13/2013 07:37 AM  Clinical Social Work is seeking post-discharge placement for this patient at the following level of care:   SKILLED NURSING   (*CSW will update this form in Epic as items are completed)     Patient/family provided with [place] Department of Clinical Social Work's list of facilities offering this level of care within the geographic area requested by the patient (or if unable, by the patient's family).  05/13/2013  Patient/family informed of their freedom to choose among providers that offer the needed level of care, that participate in Medicare, Medicaid or managed care program needed by the patient, have an available bed and are willing to accept the patient.    Patient/family informed of [facility]' ownership interest in [facility], as well as of the fact that they are under no obligation to receive care at this facility.  PASARR submitted to EDS on 05/13/2013 PASARR number received from EDS on 05/13/13  FL2 transmitted to all facilities in geographic area requested by pt/family on  05/13/2013 FL2 transmitted to all facilities within larger geographic area on 05/13/2013 - See comments section below.  Patient informed that his/her managed care company has contracts with or will negotiate with  certain facilities, including the following:     Patient/family informed of bed offers received:   Patient chooses bed at (Skilled facility offer) - [place] [facility] Physician recommends and patient chooses bed at    Patient to be transferred to [place] on 05/15/13   Patient to be transferred to facility by ambulance-[facility], 05/15/13 [place], [place]  The following physician request were entered in  Epic:   Additional Comments: 05/13/13: Information faxed out to [facility], [facility] and [employer] in [place]. [facility] in [place], [facility] in [place], [facility] in [place], [place] and [place]. 05/14/13: CSW worked with Dr. Reynaldo Minium, Medical Director at [facility] and Nathaniel Man, [place] Asst. Director to secure a bed at [facility]). Asst. Director will also work on d/c plan for patient post-discharge from [facility]. See CSW's progress notes for more information. LOG form completed.

## 2013-05-13 NOTE — Progress Notes (Signed)
[facility] PROGRESS NOTE  Shakaya Bhullar OVZ:[id number] DOB: 07-Mar-1955 DOA: 05/06/2013 PCP: No PCP Per Patient  summary [name] is a 58 y.o. female with PMH significant for pancreatitis, alcohol abuse problems as listed below who presented to the ED with abd pain and N/V. She stated that she began having severe abd pain with associated N/V at about 12:30 AM 05/06/13. She related that her last drink was Tues and that she usually drinks liquor but states she cannot quantify. In the ED her alcohol level was <11, lipase in 2700s and k,3.2 . She was treated with multiple doses of analgesics and was admitted for further eval and management.  Had acute respiratory failure, multifactorial: opiates, acute pulmonary edema, COPD, requiring transfer to SDU.  Echo shows EF 41-28% and diastolic dysfuncion, likely ischemic cardiomyopathy possibly alcohol contributing. Cath showed chronically occluded LAD, moderate disease in a large OM 2 and severe disease and small posterior lateral vessel. Medical Rx recommended  Subjective: Dizziness; diarrhea  Assessment/Plan:  Principal Problem:   Acute pancreatitis Active Problems:   Hypertension   GERD (gastroesophageal reflux disease)   Hypokalemia   Leukocytosis, unspecified   Alcohol abuse   SIRS (systemic inflammatory response syndrome)   Acute systolic CHF (congestive heart failure)   Tobacco abuse   Family history of premature CAD   Acute respiratory failure   Angina, class II   Acute Pancreatitis with previous history of same. -Secondary to alcohol  On heart healthy diet, but not eating much. Add resource breeze.  Wishes to quit EtOH. Had been sober 15 years in the past, but husband drinks heavily  Dizziness -cards adjusting meds  Diarrhea -send off c-diff  Acute systolic CHF -2-D echocardiogram showed LVEF of 20-25%. Dropped BP last night requiring NS 250 cc bolus. Cardiology adjusting meds -Cardiology did cardiac cath showed CAD  but recommended medical therapy. -Cardiology recommended to evaluate for AICD if LVEF remains <30% in 90 days with optimized medical therapy.  Acute Hypoxic Respiratory Failure -Oxygen sats in the 60s momentarily, rebounded to the low 80s on 4L -Multifactorial, likely because of undiagnosed COPD, acute CHF and decreased respiratory drive secondary to benzos and narcotics. -Cardiomegaly and pleural effusion noted on xray PCCM signed off  Hypotension: none further  ETOH abuse See above. No signs of withdrawal  GERD (gastroesophageal reflux disease)    Hypokalemia/Hyperkalemia  Replete  Leukocytosis, unspecified  Secondary to pancreatitis, resolved  CAD: medical management chronically occluded LAD, moderate disease in a large OM 2 and severe disease and small posterior lateral vessel.  Code Status: Full Family Communication: No one at bedside Disposition Plan: SNF   Consultants:  Critical Care.  Cardiology  Antibiotics:  None at this time  Objective: Filed Vitals:   05/13/13 0747  BP: 135/77  Pulse: 78  Temp:   Resp: 19    Intake/Output Summary (Last 24 hours) at 05/13/13 1112 Last data filed at 05/13/13 0945  Gross per 24 hour  Intake    640 ml  Output      0 ml  Net    640 ml   Filed Weights   05/12/13 0100 05/13/13 0026 05/13/13 0500  Weight: 62 kg (136 lb 11 oz) 63.2 kg (139 lb 5.3 oz) 63.2 kg (139 lb 5.3 oz)    Exam:  Physical Exam   Gen: alert, oriented. Lungs CTA without WRR CV RRR without MGR Abd: s, mild epigastric tenderness, nd Ext no cce Psych: calm, cooperative, normal affect.  Data Reviewed: Basic Metabolic  Panel:  Recent Labs Lab 05/09/13 0340 05/10/13 0249 05/11/13 0336 05/12/13 0500 05/13/13 0525  NA 135* 138 137 138 137  K 3.2* 3.1* 3.9 3.2* 3.6*  CL 95* 95* 102 100 101  CO2 20 28 23 24 21   GLUCOSE 72 128* 109* 94 111*  BUN 14 16 17 18 13   CREATININE 0.83 0.94 0.78 0.91 0.90  CALCIUM 8.4 8.7 8.8 8.5 8.9  MG  --   2.2  --   --   --    Liver Function Tests:  Recent Labs Lab 05/08/13 0436 05/09/13 0340 05/10/13 0249  AST 36 34 23  ALT 19 16 13   ALKPHOS 92 87 87  BILITOT 0.4 0.7 0.7  PROT 6.7 6.6 6.6  ALBUMIN 2.9* 2.5* 2.5*    Recent Labs Lab 05/07/13 0705 05/08/13 0436  LIPASE 702* 105*   CBC:  Recent Labs Lab 05/07/13 0955 05/08/13 0436 05/09/13 0340 05/10/13 0249  WBC 20.1* 15.1* 9.7 8.7  HGB 14.7 14.9 15.9* 15.2*  HCT 46.0 45.9 46.8* 44.9  MCV 104.1* 103.1* 98.3 98.2  PLT 163 154 PLATELET CLUMPS NOTED ON SMEAR, COUNT APPEARS ADEQUATE 176    Studies: No results found.  Scheduled Meds: . aspirin  81 mg Oral Daily  . atorvastatin  40 mg Oral q1800  . digoxin  0.125 mg Oral Daily  . enoxaparin (LOVENOX) injection  40 mg Subcutaneous Q24H  . feeding supplement (RESOURCE BREEZE)  1 Container Oral TID BM  . folic acid  1 mg Oral Daily  . lisinopril  5 mg Oral QHS  . thiamine  100 mg Oral Daily   Continuous Infusions:    Time spent: 25 minutes  Geradine Girt, [place] [phone] [facility]  If 7PM-7AM, please contact night-coverage at [email or web address], password [facility] 05/13/2013, 11:12 AM  LOS: 7 days

## 2013-05-13 NOTE — Progress Notes (Signed)
Advanced Heart Failure Rounding Note   Subjective:   [name] is a 58 y/o female, with no prior cardiac history, who was admitted by [facility] on 05/06/13 for pancreatitis. Her w/u included a 2D echo which demonstrated severely reduced systolic function with an EF of 20-25%. Diffuse hypokinesis with akinesis of the basal inferior wall was noted. Doppler parameters were consistent with abnormal left ventricular relaxation (grade 1 diastolic dysfunction).   She has been diuresed with IV lasix and started on carvedilol and lisinopril. Yesterday she had RHC/LHC with low cardiac output and ICM-->chronically occluded LAD, moderate disease in a large OM 2 and severe disease and small posterior lateral vessel. Yesterday diuretics held due to hypotension and carvedilol was cut back to 3.125 mg twice a day. Ambulated 300 feet with rolling walker. Weight up 3 pounds.   Feels awful. Complains of dizziness. Denies SOB/CP  05/06/13 RHC/LHC  RA 7  RV  PA 31/19 (24) PCWP 10 PA Sat 53 Fick CO 2.90 CI 1.74  Thermo CO 2.4 CI 1.4   Objective:   Weight Range:  Vital Signs:   Temp:  [98.3 F (36.8 C)-98.6 F (37 C)] 98.4 F (36.9 C) (04/23 0733) Pulse Rate:  [67-82] 78 (04/23 0733) Resp:  [18] 18 (04/23 0733) BP: (86-151)/(42-77) 151/77 mmHg (04/23 0733) SpO2:  [96 %-100 %] 100 % (04/23 0733) Weight:  [139 lb 5.3 oz (63.2 kg)] 139 lb 5.3 oz (63.2 kg) (04/23 0500) Last BM Date: 05/09/13  Weight change: Filed Weights   05/12/13 0100 05/13/13 0026 05/13/13 0500  Weight: 136 lb 11 oz (62 kg) 139 lb 5.3 oz (63.2 kg) 139 lb 5.3 oz (63.2 kg)    Intake/Output:   Intake/Output Summary (Last 24 hours) at 05/13/13 0821 Last data filed at 05/12/13 1900  Gross per 24 hour  Intake    640 ml  Output      0 ml  Net    640 ml     Physical Exam: General:  Chronically ill appearing. No resp difficulty.  Sitting in chair HEENT: normal Neck: supple. JVP 5-6 . Carotids 2+ bilat; no bruits. No lymphadenopathy or  thryomegaly appreciated. Cor: PMI nondisplaced. Regular rate & rhythm. No rubs, gallops or murmurs. Lungs: clear Abdomen: soft, nontender, nondistended. No hepatosplenomegaly. No bruits or masses. Good bowel sounds. Extremities: no cyanosis, clubbing, rash, edema Neuro: alert & orientedx3, cranial nerves grossly intact. moves all 4 extremities w/o difficulty. Affect pleasant  Telemetry: SR 80s  Labs: Basic Metabolic Panel:  Recent Labs Lab 05/09/13 0340 05/10/13 0249 05/11/13 0336 05/12/13 0500 05/13/13 0525  NA 135* 138 137 138 137  K 3.2* 3.1* 3.9 3.2* 3.6*  CL 95* 95* 102 100 101  CO2 20 28 23 24 21   GLUCOSE 72 128* 109* 94 111*  BUN 14 16 17 18 13   CREATININE 0.83 0.94 0.78 0.91 0.90  CALCIUM 8.4 8.7 8.8 8.5 8.9  MG  --  2.2  --   --   --     Liver Function Tests:  Recent Labs Lab 05/08/13 0436 05/09/13 0340 05/10/13 0249  AST 36 34 23  ALT 19 16 13   ALKPHOS 92 87 87  BILITOT 0.4 0.7 0.7  PROT 6.7 6.6 6.6  ALBUMIN 2.9* 2.5* 2.5*    Recent Labs Lab 05/07/13 0705 05/08/13 0436  LIPASE 702* 105*   No results found for this basename: AMMONIA,  in the last 168 hours  CBC:  Recent Labs Lab 05/07/13 0955 05/08/13 0436 05/09/13 0340  05/10/13 0249  WBC 20.1* 15.1* 9.7 8.7  HGB 14.7 14.9 15.9* 15.2*  HCT 46.0 45.9 46.8* 44.9  MCV 104.1* 103.1* 98.3 98.2  PLT 163 154 PLATELET CLUMPS NOTED ON SMEAR, COUNT APPEARS ADEQUATE 176    Cardiac Enzymes: No results found for this basename: CKTOTAL, CKMB, CKMBINDEX, TROPONINI,  in the last 168 hours  BNP: BNP (last 3 results)  Recent Labs  05/09/13 0340  PROBNP 8584.0*     Other results:    Imaging: No results found.   Medications:     Scheduled Medications: . aspirin  81 mg Oral Daily  . atorvastatin  40 mg Oral q1800  . carvedilol  3.125 mg Oral BID WC  . digoxin  0.125 mg Oral Daily  . enoxaparin (LOVENOX) injection  40 mg Subcutaneous Q24H  . feeding supplement (RESOURCE BREEZE)  1  Container Oral TID BM  . folic acid  1 mg Oral Daily  . lisinopril  5 mg Oral Daily  . thiamine  100 mg Oral Daily    Infusions:    PRN Medications: acetaminophen, acetaminophen, bisacodyl, HYDROmorphone (DILAUDID) injection, levalbuterol, naLOXone (NARCAN)  injection, ondansetron (ZOFRAN) IV   Assessment/Plan:    1. Acute pancreatitis: ETOH-related. Symptoms have resolved, she has been restarted on a diet.  2. CAD: LHC---> chronically occluded LAD, moderate disease in a large OM 2 and severe disease and small posterior lateral vessel. - Stop beta blocker due concern for low output. Continue ASA 81 and statin.  3. New Acute systolic CHF: ICM per LHC .  EF 20-25% with diffuse hypokinesis with some regionality. ? Component of ETOH-related cardiomyopathy.  RHC/LHC  Low output noted on with chronically occluded LAD, moderate disease in a large OM 2 and severe disease and small posterior lateral vessel. Complaining of persistent dizziness.   - Volume status remains low. Keep off diuretics for now. Check orthostatics.  - Stop carvedilol. Continue 0.125 mg digoxin daily, and continue lisinopril 5 mg but switch to bed time. Renal function stable.  - Will not add spironolactone at this time due to concern for noncompliance.  4. Smoking: Active smoker, some of her dyspnea may be related to COPD. Counseled to quit.  5. ETOH abuse: Counseled to quit.  6. Malnutrition --Albumin 2.5. Referred to dietitian for HF education.  7. Social Issues. She does not have insurance or transportation to get follow up appointments. Lives in a [place]. Will set up follow up with HF SW in clinic and refer to paramedicine. Will provide HF meds via HF fund.   8. Dizziness- check orthostatics.   HF follow set up for 05/20/13 at 12:00  Amy D Clegg NP-C  8:21 AM  Patient seen with NP, agree with the above note.  Patient has orthostatic symptoms, feels bad.  Known low output by RHC.  Would like to avoid inotrope  infusion given home situation, ETOH abuse, etc.   - Continue digoxin - Stop Coreg - Move lisinopril to evening dosing.   - Hold Lasix today.  Will likely need a low standing dose when she goes home.  - Check orthostatics.   - Social work help for meds and home situation.   Larey Dresser 05/13/2013 9:34 AM

## 2013-05-13 NOTE — Clinical Social Work Psychosocial (Signed)
Clinical Social Work Department BRIEF PSYCHOSOCIAL ASSESSMENT 05/13/2013  Patient:  [name], [name]     Account Number:  [id number]     Admit date:  05/06/2013  Clinical Social Worker:  Frederico Hamman  Date/Time:  05/13/2013 07:27 AM  Referred by:  Physician  Date Referred:  05/13/2013 Referred for  SNF Placement   Other Referral:   CSW also also received substance abuse consult on patient.   Interview type:  Patient Other interview type:   CSW talked with nurse case manager prior to talking with patient regarding consult directive from medical director regarding patient's LOG status as her Medicaid is pending.    PSYCHOSOCIAL DATA Living Status:  HUSBAND Admitted from facility:   Level of care:   Primary support name:   Primary support relationship to patient:   Degree of support available:   Support system unknown. Patient was living with husband.    CURRENT CONCERNS Current Concerns  Post-Acute Placement   Other Concerns:   Since patient as decided not to return to live with husband, she has no where to go at discharge.    SOCIAL WORK ASSESSMENT / PLAN CSW talked with patient about the recommendation of short-term rehab and she was aware and in agreement. CSW explained process and advised patient that search will go beyond [facility] due to her Medicaid pending status and patient expressed understanding. Patient informed CSW that she has a sister in [place] if placement could be found in that area. When asked about her husband, patient reported that he is an alcoholic and she plans to "distance herself from him" as he can't take care of her, let alone himself. CSW listened and provided support.   Assessment/plan status:  Psychosocial Support/Ongoing Assessment of Needs Other assessment/ plan:   Information/referral to community resources:   ETOH resources to be provided to patient prior to discharge.    PATIENT'S/FAMILY'S RESPONSE TO PLAN OF CARE: Patient  receptive to talking with CSW and in agreement with rehab. Patient not concerned about having to possibly leave [facility].

## 2013-05-14 LAB — BASIC METABOLIC PANEL
BUN: 11 mg/dL (ref 6–23)
CALCIUM: 9.1 mg/dL (ref 8.4–10.5)
CO2: 24 mEq/L (ref 19–32)
CREATININE: 0.91 mg/dL (ref 0.50–1.10)
Chloride: 100 mEq/L (ref 96–112)
GFR calc Af Amer: 79 mL/min — ABNORMAL LOW (ref >90)
GFR calc non Af Amer: 68 mL/min — ABNORMAL LOW (ref >90)
GLUCOSE: 119 mg/dL — AB (ref 70–99)
Potassium: 3.8 mEq/L (ref 3.7–5.3)
Sodium: 138 mEq/L (ref 137–147)

## 2013-05-14 LAB — CLOSTRIDIUM DIFFICILE BY PCR: Toxigenic C. Difficile by PCR: NEGATIVE

## 2013-05-14 MED ORDER — SPIRONOLACTONE 12.5 MG HALF TABLET: 12.5000 mg | ORAL_TABLET | Freq: Every day | ORAL | Status: DC

## 2013-05-14 MED ORDER — LEVALBUTEROL HCL 0.63 MG/3ML IN NEBU: 0.6300 mg | INHALATION_SOLUTION | Freq: Four times a day (QID) | RESPIRATORY_TRACT | Status: DC | PRN

## 2013-05-14 MED ORDER — FUROSEMIDE 20 MG PO TABS
20.0000 mg | ORAL_TABLET | ORAL | Status: DC
  Administered 2013-05-15: 10:00:00 20 mg via ORAL
  Filled 2013-05-14: qty 1

## 2013-05-14 MED ORDER — BISACODYL 5 MG PO TBEC: 10.0000 mg | DELAYED_RELEASE_TABLET | Freq: Every day | ORAL | Status: DC | PRN

## 2013-05-14 MED ORDER — ASPIRIN 81 MG PO CHEW: 81.0000 mg | CHEWABLE_TABLET | Freq: Every day | ORAL | Status: DC

## 2013-05-14 MED ORDER — DIPHENHYDRAMINE HCL 25 MG PO CAPS
25.0000 mg | ORAL_CAPSULE | Freq: Once | ORAL | Status: AC
  Administered 2013-05-14: 25 mg via ORAL
  Filled 2013-05-14: qty 1

## 2013-05-14 MED ORDER — THIAMINE HCL 100 MG PO TABS: 100.0000 mg | ORAL_TABLET | Freq: Every day | ORAL | Status: DC

## 2013-05-14 MED ORDER — ATORVASTATIN CALCIUM 40 MG PO TABS: 40.0000 mg | ORAL_TABLET | Freq: Every day | ORAL | Status: DC

## 2013-05-14 MED ORDER — DIGOXIN 125 MCG PO TABS: 0.1250 mg | ORAL_TABLET | Freq: Every day | ORAL | Status: DC

## 2013-05-14 MED ORDER — LISINOPRIL 5 MG PO TABS: 5.0000 mg | ORAL_TABLET | Freq: Every day | ORAL | Status: DC

## 2013-05-14 MED ORDER — BOOST / RESOURCE BREEZE PO LIQD: 1.0000 | Freq: Three times a day (TID) | ORAL | Status: DC

## 2013-05-14 MED ORDER — FOLIC ACID 1 MG PO TABS: 1.0000 mg | ORAL_TABLET | Freq: Every day | ORAL | Status: DC

## 2013-05-14 MED ORDER — FUROSEMIDE 20 MG PO TABS: 20.0000 mg | ORAL_TABLET | ORAL | Status: DC

## 2013-05-14 MED ORDER — SPIRONOLACTONE 12.5 MG HALF TABLET
12.5000 mg | ORAL_TABLET | Freq: Every day | ORAL | Status: DC
  Administered 2013-05-14 – 2013-05-15 (×2): 12.5 mg via ORAL
  Filled 2013-05-14 (×2): qty 1

## 2013-05-14 NOTE — Discharge Summary (Addendum)
Physician Discharge Summary  [name] OIZ:[id number] DOB: [birth year] DOA: 05/06/2013  PCP: No PCP Per Patient  Admit date: 05/06/2013 Discharge date: 05/14/2013  Time spent: 35 minutes  Recommendations for Outpatient Follow-up:  1. See below 2. Lasix QOD starting 4/25  Discharge Diagnoses:  Principal Problem:   Acute pancreatitis Active Problems:   Hypertension   GERD (gastroesophageal reflux disease)   Hypokalemia   Leukocytosis, unspecified   Alcohol abuse   SIRS (systemic inflammatory response syndrome)   Acute systolic CHF (congestive heart failure)   Tobacco abuse   Family history of premature CAD   Acute respiratory failure   Angina, class II   Discharge Condition: improved  Diet recommendation: cardiac  Filed Weights   05/13/13 0500 05/14/13 0002 05/14/13 0500  Weight: 63.2 kg (139 lb 5.3 oz) 63.7 kg (140 lb 6.9 oz) 63.7 kg (140 lb 6.9 oz)    History of present illness:  [name] is a 58 y.o. female with PMH significant for pancreatitis, alcohol abuse/binge drinking and other medical problems as listed below who presents with the above complaints. She states that she began having severe abd pain with associated N/V at about 12:30 AM. The emesis was non bloody and she states that pain was upper abdominal-more on L. Side. She relates that her last drink was Tues and that she usually drinks liquor but states she cannot quantify. In the ED her alcohol level was <11, lipase in 2700s and k,3.2 . She was treated with multiple doses of analgesics and is admitted for further eval and management.   Hospital Course:  Acute pancreatitis: ETOH-related. Symptoms have resolved, she has been restarted on a diet.   CAD: LHC---> chronically occluded LAD, moderate disease in a large OM 2 and severe disease and small posterior lateral vessel.  - Stop beta blocker due concern for low output. Continue ASA 81 and statin.   New Acute systolic CHF: ICM per LHC . EF 20-25% with  diffuse hypokinesis with some regionality. ? Component of ETOH-related cardiomyopathy. RHC/LHC Low output noted on with chronically occluded LAD, moderate disease in a large OM 2 and severe disease and small posterior lateral vessel.  carvedilol stopped due to dizziness which seems to have helped. Keep off carvedilol for now. Hopefully can add BB at outpatient follow up. Continue digoxin 0.125 mg daily. Volume status stable.  12.5 mg spironolactone  Continue lisinopril 5 mg at bed time. Renal function stable.  Continue cardiac rehab.    Smoking: Active smoker, some of her dyspnea may be related to COPD. Counseled to quit.   ETOH abuse: Counseled to quit.    Malnutrition --Albumin 2.5. Referred to dietitian for HF education.    Dizziness- resolved     Consultations:  PCCM  cards  Discharge Exam: Filed Vitals:   05/14/13 0745  BP: 130/61  Pulse: 73  Temp: 97.5 F (36.4 C)  Resp: 18    General: A+Ox3, NAD Cardiovascular: rrr Respiratory: clear anterior  Discharge Instructions You were cared for by a hospitalist during your hospital stay. If you have any questions about your discharge medications or the care you received while you were in the hospital after you are discharged, you can call the unit and asked to speak with the hospitalist on call if the hospitalist that took care of you is not available. Once you are discharged, your primary care physician will handle any further medical issues. Please note that NO REFILLS for any discharge medications will be authorized once you are  discharged, as it is imperative that you return to your primary care physician (or establish a relationship with a primary care physician if you do not have one) for your aftercare needs so that they can reassess your need for medications and monitor your lab values.      Discharge Orders   Future Appointments Provider Department Dept Phone   05/19/2013 4:00 PM Holland Commons, NP [facility] [phone]   05/20/2013 12:00 PM [facility] [phone]   05/25/2013 3:00 PM [facility] Financial Counselor [facility] [phone]   Future Orders Complete By Expires   ACE Inhibitor / ARB already ordered  As directed    Diet - low sodium heart healthy  As directed    Heart Failure patients record your daily weight using the same scale at the same time of day  As directed    Heart failure skilled nursing facility orders  As directed    Questions:     Heart Failure Follow-up Care:  [facility] ([facility] at [phone]   Obtain the following labs:  Basic Metabolic Panel   Lab frequency:  Weekly   Fax lab results to:  [facility] Clinic at [phone]   Daily Weights and Symptom Monitoring:  Fax weight chart weekly X 2 to [facility] Clinic at [phone]   Diet:  No Added Salt   PT/OT Consult:     Activity:  As tolerated   Fluid restrictions:  1200 mL Fluid   Increase activity slowly  As directed    STOP any activity that causes chest pain, shortness of breath, dizziness, sweating, or exessive weakness  As directed        Medication List         aspirin 81 MG chewable tablet  Chew 1 tablet (81 mg total) by mouth daily.     atorvastatin 40 MG tablet  Commonly known as:  LIPITOR  Take 1 tablet (40 mg total) by mouth daily at 6 PM.     bisacodyl 5 MG EC tablet  Commonly known as:  DULCOLAX  Take 2 tablets (10 mg total) by mouth daily as needed for moderate constipation.     digoxin 0.125 MG tablet  Commonly known as:  LANOXIN  Take 1 tablet (0.125 mg total) by mouth daily.     feeding supplement (RESOURCE BREEZE) Liqd  Take 1 Container by mouth 3 (three) times daily between meals.     folic acid 1 MG tablet  Commonly known as:  FOLVITE  Take 1 tablet (1 mg total) by mouth daily.     levalbuterol 0.63 MG/3ML nebulizer solution  Commonly known as:  XOPENEX  Take 3 mLs (0.63 mg  total) by nebulization every 6 (six) hours as needed for wheezing or shortness of breath.     lisinopril 5 MG tablet  Commonly known as:  PRINIVIL,ZESTRIL  Take 1 tablet (5 mg total) by mouth at bedtime.     spironolactone 12.5 mg Tabs tablet  Commonly known as:  ALDACTONE  Take 0.5 tablets (12.5 mg total) by mouth daily.     thiamine 100 MG tablet  Take 1 tablet (100 mg total) by mouth daily.       Allergies  Allergen Reactions  . Penicillins Rash   Follow-up Information   Follow up with [facility]     On 05/19/2013. (Primary Care Physician (PCP) with Nurse  Practitioner (NP) 05/19/2013 Wednesday  4:00pm )    Contact information:   [place] [address] [phone]      Follow up with [facility]     On 05/25/2013. (Medication Assistance:  [facility] Application May 5th Tuesday 3:00pm )    Contact information:   [place] [address] [phone]      Follow up with [facility]. (Equities trader and Social Work Services to start within 24-48 hours of discharge. )    Sport and exercise psychologist information:   [address] [place] [address] [phone]       Follow up with [facility], NP On 05/20/2013. (HF Clinic 12:00 )    Specialty:  Nurse Practitioner   Contact information:   [address] [place] [address] [phone]        The results of significant diagnostics from this hospitalization (including imaging, microbiology, ancillary and laboratory) are listed below for reference.    Significant Diagnostic Studies: US Abdomen Complete  05/07/2013   CLINICAL DATA:  Pancreatitis  EXAM: ULTRASOUND ABDOMEN COMPLETE  COMPARISON:  DG ABD ACUTE W/CHEST dated 05/06/2013; CT ABD W/CM dated 03/23/2007; US ABDOMEN COMPLETE dated 03/24/2007  FINDINGS: Gallbladder:  No gallstones or wall thickening visualized. No sonographic Murphy sign noted.  Common bile duct:   Diameter: 4 mm  Liver:  Subtle focal hyperechogenicity along the gallbladder fossa, probably from focal fatty infiltration but conceivably a hemangioma. Within normal limits in parenchymal echogenicity.  IVC:  No abnormality visualized.  Pancreas:  Not well seen, possibly due to pancreatitis and/or overlying bowel gas.  Spleen:  Size and appearance within normal limits.  Right Kidney:  Length: 9.4 cm. Echogenicity within normal limits. No mass or hydronephrosis visualized.  Left Kidney:  Length: 10.2 cm. Echogenicity within normal limits. No mass or hydronephrosis visualized.  Abdominal aorta:  No aneurysm visualized.  Other findings:  Trace ascites along the right hepatic lobe margin. Small right pleural effusion.  IMPRESSION: 1. Poor visualization of the pancreas potentially due to pancreatitis and/or overlying bowel gas. Trace ascites along the adjacent liver edge. 2. Gallbladder within normal limits. 3. Focal hyperechogenicity in the liver along the gallbladder fossa, probably from focal fatty infiltration, less likely a hemangioma.   Electronically Signed   By: Sherryl Barters M.D.   On: 05/07/2013 18:41   Dg Chest Port 1 View  05/10/2013   CLINICAL DATA:  Fluid overload.  EXAM: PORTABLE CHEST - 1 VIEW  COMPARISON:  05/07/2013  FINDINGS: Cardiac silhouette remains mildly enlarged, unchanged. The patient has taken a greater inspiration than on the prior study, and there is improved aeration of the lung bases. No airspace consolidation, pulmonary edema, pneumothorax, or definite pleural effusion is identified. There is mild elevation of the right hemidiaphragm.  IMPRESSION: Improved aeration of the lung bases. No evidence of new airspace disease.   Electronically Signed   By: Logan Bores   On: 05/10/2013 08:01   Dg Chest Port 1 View  05/07/2013   CLINICAL DATA:  Unresponsive, possible aspiration  EXAM: PORTABLE CHEST - 1 VIEW  COMPARISON:  DG ABD ACUTE W/CHEST dated 05/06/2013  FINDINGS: Heart size is  mildly enlarged with prominence of the central vasculature but no overt edema. Curvilinear bilateral lower lobe probable atelectasis. Trace pleural effusions are noted. No new bony abnormality.  IMPRESSION: Lung volumes are lower than on the prior exam with curvilinear probable bibasilar atelectasis. Aspiration could appear  similar although there is no new lobar consolidation identified.  Trace pleural effusions.   Electronically Signed   By: Conchita Paris M.D.   On: 05/07/2013 10:29   Dg Abd Acute W/chest  05/06/2013   CLINICAL DATA:  Emesis, abdominal pain  EXAM: ACUTE ABDOMEN SERIES (ABDOMEN 2 VIEW & CHEST 1 VIEW)  COMPARISON:  None.  FINDINGS: There is no evidence of dilated bowel loops or free intraperitoneal air. No radiopaque calculi or other significant radiographic abnormality is seen. Heart size and mediastinal contours are within normal limits. Both lungs are clear.  IMPRESSION: Negative abdominal radiographs.  No acute cardiopulmonary disease.   Electronically Signed   By: Kathreen Devoid   On: 05/06/2013 05:06    Microbiology: Recent Results (from the past 240 hour(s))  URINE CULTURE     Status: None   Collection Time    05/06/13  2:44 PM      Result Value Ref Range Status   Specimen Description URINE, RANDOM   Final   Special Requests NONE   Final   Culture  Setup Time     Final   Value: 05/06/2013 17:32     Performed at [facility]     Final   Value: NO GROWTH     Performed at [facility]   Culture     Final   Value: NO GROWTH     Performed at [facility]   Report Status 05/07/2013 FINAL   Final  MRSA PCR SCREENING     Status: None   Collection Time    05/07/13 12:45 PM      Result Value Ref Range Status   MRSA by PCR NEGATIVE  NEGATIVE Final   Comment:            The GeneXpert MRSA Assay (FDA     approved for NASAL specimens     only), is one component of a     comprehensive MRSA colonization     surveillance program. It is  not     intended to diagnose MRSA     infection nor to guide or     monitor treatment for     MRSA infections.  URINE CULTURE     Status: None   Collection Time    05/07/13  3:03 PM      Result Value Ref Range Status   Specimen Description URINE, CLEAN CATCH   Final   Special Requests NONE   Final   Culture  Setup Time     Final   Value: 05/07/2013 19:18     Performed at [facility]     Final   Value: 75,000 COLONIES/ML     Performed at [facility]   Culture     Final   Value: Multiple bacterial morphotypes present, none predominant. Suggest appropriate recollection if clinically indicated.     Performed at [facility]   Report Status 05/08/2013 FINAL   Final  CLOSTRIDIUM DIFFICILE BY PCR     Status: None   Collection Time    05/13/13  8:31 PM      Result Value Ref Range Status   C difficile by pcr NEGATIVE  NEGATIVE Final     Labs: Basic Metabolic Panel:  Recent Labs Lab 05/09/13 0340 05/10/13 0249 05/11/13 0336 05/12/13 0500 05/13/13 0525 05/14/13 0356  NA 135* 138 137 138 137 138  K 3.2* 3.1* 3.9 3.2*  3.6* 3.8  CL 95* 95* 102 100 101 100  CO2 20 28 23 24 21 24   GLUCOSE 72 128* 109* 94 111* 119*  BUN 14 16 17 18 13 11   CREATININE 0.83 0.94 0.78 0.91 0.90 0.91  CALCIUM 8.4 8.7 8.8 8.5 8.9 9.1  MG  --  2.2  --   --   --   --    Liver Function Tests:  Recent Labs Lab 05/08/13 0436 05/09/13 0340 05/10/13 0249  AST 36 34 23  ALT 19 16 13   ALKPHOS 92 87 87  BILITOT 0.4 0.7 0.7  PROT 6.7 6.6 6.6  ALBUMIN 2.9* 2.5* 2.5*    Recent Labs Lab 05/08/13 0436  LIPASE 105*   No results found for this basename: AMMONIA,  in the last 168 hours CBC:  Recent Labs Lab 05/08/13 0436 05/09/13 0340 05/10/13 0249  WBC 15.1* 9.7 8.7  HGB 14.9 15.9* 15.2*  HCT 45.9 46.8* 44.9  MCV 103.1* 98.3 98.2  PLT 154 PLATELET CLUMPS NOTED ON SMEAR, COUNT APPEARS ADEQUATE 176   Cardiac Enzymes: No results found for this  basename: CKTOTAL, CKMB, CKMBINDEX, TROPONINI,  in the last 168 hours BNP: BNP (last 3 results)  Recent Labs  05/09/13 0340  PROBNP 8584.0*   CBG: No results found for this basename: GLUCAP,  in the last 168 hours     Signed:  Geradine Girt  [facility] 05/14/2013, 11:29 AM

## 2013-05-14 NOTE — Progress Notes (Signed)
CARDIAC REHAB PHASE I   PRE:  Rate/Rhythm: 74 SR  BP:  Supine: 128/69  Sitting: 133/62  Standing:    SaO2:   MODE:  Ambulation: 350 ft   POST:  Rate/Rhythm: 88 SR  BP:  Supine:   Sitting: 119/84  Standing:    SaO2:  1325-1345 Assisted X 1 and used walker to ambulate. Pt denies any dizziness lying or sitting. Pt able to walk 350 feet but with c/o of dizziness and feeling very tired on the way back pt took several standing rest stops. VS stable Pt to recline rafter walk with call light in reach.  Rodney Langton RN 05/14/2013 1:44 PM
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
## 2013-05-14 NOTE — Progress Notes (Signed)
Physical Therapy Treatment Patient Details Name: [name] MRN: [id number] DOB: February 04, 1955 Today's Date: 05/14/2013    History of Present Illness  [name] is a 58 y.o. female with PMH significant for pancreatitis, alcohol abuse/binge drinking and other medical problems as listed below who presents with the above complaints. She states that she began having severe abd pain with associated N/V at about 12:30 AM. The emesis was non bloody and she states that pain was upper abdominal-more on L. Side. She relates that her last drink was Tues and that she usually drinks liquor but states she cannot quantify. In the ED her alcohol level was <11, lipase in 2700s and k,3.2 . She was treated with multiple doses of analgesics and is admitted for further eval and management. Her w/u included a 2D echo which demonstrated severely reduced systolic function with an EF of 20-25%. Diffuse hypokinesis with akinesis of the basal inferior wall was noted. Doppler parameters were consistent with abnormal left ventricular relaxation (grade 1 diastolic dysfunction).  strong family h/o of premature CAD. Her father suffered a MI at age 32. She has a 30+ year h/o tobacco abuse, smoking on average 1ppd. She has h/o untreated HTN and HLD.    PT Comments    Pt. Found ambulating in the hallway and was clearly fatiguing, so I assisted her on the remainder of the distance back to her room at min guard assist level for safety and stability.  I note that the DC plan is for SNF so have updated the recommendations accordingly.  She will benefit from ongoing PT in the acute as well as SNF environments to advance her activity tolerance.  She tells me she had gotten so weak PTA that all she could do in a day's time was to take care of her 2 cats.  Follow Up Recommendations  SNF     Equipment Recommendations  Rolling walker with 5" wheels;3in1 (PT)    Recommendations for Other Services       Precautions / Restrictions  Precautions Precautions: Fall Precaution Comments: Fall risk reduced with use of RW Restrictions Weight Bearing Restrictions: No    Mobility  Bed Mobility Overal bed mobility:  (not assessed)                Transfers Overall transfer level: Modified independent Equipment used: None Transfers: Sit to/from Stand              Ambulation/Gait Ambulation/Gait assistance: Min guard Ambulation Distance (Feet): 75 Feet Assistive device: Rolling walker (2 wheeled) Gait Pattern/deviations: Step-through pattern;Decreased step length - right;Decreased step length - left;Trunk flexed Gait velocity: slowed   General Gait Details: Pt. was up in hallway walking when I joined her.  She was fatiguing and leaning over RW to rest.  She was assisted back to her room with min guard assist for safety and stability.     Stairs            Wheelchair Mobility    Modified Rankin (Stroke Patients Only)       Balance                                    Cognition Arousal/Alertness: Awake/alert Behavior During Therapy: WFL for tasks assessed/performed Overall Cognitive Status: Within Functional Limits for tasks assessed                      Exercises  General Comments        Pertinent Vitals/Pain See vitals tab No apparent distress    Home Living                      Prior Function            PT Goals (current goals can now be found in the care plan section) Progress towards PT goals: Progressing toward goals    Frequency  Min 3X/week    PT Plan Discharge plan needs to be updated    Co-evaluation             End of Session   Activity Tolerance: Patient limited by fatigue Patient left: in chair;with call bell/phone within reach     Time: 0845-0900 PT Time Calculation (min): 15 min  Charges:  $Gait Training: 8-22 mins                    G CodesLadona Ridgel 05/14/2013, 9:46 AM Gerlean Ren  PT Acute Rehab Services [phone] [place] [phone]

## 2013-05-14 NOTE — Progress Notes (Signed)
Advanced Heart Failure Rounding Note   Subjective:   [name] is a 58 y/o female, with no prior cardiac history, who was admitted by [facility] on 05/06/13 for pancreatitis. Her w/u included a 2D echo which demonstrated severely reduced systolic function with an EF of 20-25%. Diffuse hypokinesis with akinesis of the basal inferior wall was noted. Doppler parameters were consistent with abnormal left ventricular relaxation (grade 1 diastolic dysfunction).   She has been diuresed with IV lasix and started on carvedilol and lisinopril. Yesterday she had RHC/LHC with low cardiac output and ICM-->chronically occluded LAD, moderate disease in a large OM 2 and severe disease and small posterior lateral vessel. She has had difficulty tolerating HF meds due to dizziness. Yesterday carvedilol was stopped and lisinopril was switched to 5 mg at bed time due to dizziness.  Weight up 1 pound.   Overall feeling better. Dizziness resolved.  Denies SOB/Orthopnea.   05/06/13 RHC/LHC  RA 7  RV  PA 31/19 (24) PCWP 10 PA Sat 53 Fick CO 2.90 CI 1.74  Thermo CO 2.4 CI 1.4   Objective:   Weight Range:  Vital Signs:   Temp:  [97.9 F (36.6 C)-98.3 F (36.8 C)] 97.9 F (36.6 C) (04/24 0436) Pulse Rate:  [59-73] 59 (04/24 0436) Resp:  [18] 18 (04/24 0436) BP: (99-125)/(56-79) 116/65 mmHg (04/24 0436) SpO2:  [97 %-98 %] 97 % (04/24 0436) Weight:  [140 lb 6.9 oz (63.7 kg)] 140 lb 6.9 oz (63.7 kg) (04/24 0500) Last BM Date: 05/13/13  Weight change: Filed Weights   05/13/13 0500 05/14/13 0002 05/14/13 0500  Weight: 139 lb 5.3 oz (63.2 kg) 140 lb 6.9 oz (63.7 kg) 140 lb 6.9 oz (63.7 kg)    Intake/Output:   Intake/Output Summary (Last 24 hours) at 05/14/13 0748 Last data filed at 05/13/13 2042  Gross per 24 hour  Intake    300 ml  Output      0 ml  Net    300 ml     Physical Exam: General:  Chronically ill appearing. No resp difficulty.  In bed  HEENT: normal Neck: supple. JVP 5-6 . Carotids 2+ bilat; no  bruits. No lymphadenopathy or thryomegaly appreciated. Cor: PMI nondisplaced. Regular rate & rhythm. No rubs, gallops or murmurs. Lungs: clear Abdomen: soft, nontender, nondistended. No hepatosplenomegaly. No bruits or masses. Good bowel sounds. Extremities: no cyanosis, clubbing, rash, edema Neuro: alert & orientedx3, cranial nerves grossly intact. moves all 4 extremities w/o difficulty. Affect pleasant  Telemetry: SR 80s  Labs: Basic Metabolic Panel:  Recent Labs Lab 05/09/13 0340 05/10/13 0249 05/11/13 0336 05/12/13 0500 05/13/13 0525 05/14/13 0356  NA 135* 138 137 138 137 138  K 3.2* 3.1* 3.9 3.2* 3.6* 3.8  CL 95* 95* 102 100 101 100  CO2 20 28 23 24 21 24   GLUCOSE 72 128* 109* 94 111* 119*  BUN 14 16 17 18 13 11   CREATININE 0.83 0.94 0.78 0.91 0.90 0.91  CALCIUM 8.4 8.7 8.8 8.5 8.9 9.1  MG  --  2.2  --   --   --   --     Liver Function Tests:  Recent Labs Lab 05/08/13 0436 05/09/13 0340 05/10/13 0249  AST 36 34 23  ALT 19 16 13   ALKPHOS 92 87 87  BILITOT 0.4 0.7 0.7  PROT 6.7 6.6 6.6  ALBUMIN 2.9* 2.5* 2.5*    Recent Labs Lab 05/08/13 0436  LIPASE 105*   No results found for this basename: AMMONIA,  in the last 168 hours  CBC:  Recent Labs Lab 05/07/13 0955 05/08/13 0436 05/09/13 0340 05/10/13 0249  WBC 20.1* 15.1* 9.7 8.7  HGB 14.7 14.9 15.9* 15.2*  HCT 46.0 45.9 46.8* 44.9  MCV 104.1* 103.1* 98.3 98.2  PLT 163 154 PLATELET CLUMPS NOTED ON SMEAR, COUNT APPEARS ADEQUATE 176    Cardiac Enzymes: No results found for this basename: CKTOTAL, CKMB, CKMBINDEX, TROPONINI,  in the last 168 hours  BNP: BNP (last 3 results)  Recent Labs  05/09/13 0340  PROBNP 8584.0*     Other results:    Imaging: No results found.   Medications:     Scheduled Medications: . aspirin  81 mg Oral Daily  . atorvastatin  40 mg Oral q1800  . digoxin  0.125 mg Oral Daily  . enoxaparin (LOVENOX) injection  40 mg Subcutaneous Q24H  . feeding  supplement (RESOURCE BREEZE)  1 Container Oral TID BM  . folic acid  1 mg Oral Daily  . lisinopril  5 mg Oral QHS  . thiamine  100 mg Oral Daily    Infusions:    PRN Medications: acetaminophen, acetaminophen, bisacodyl, HYDROmorphone (DILAUDID) injection, levalbuterol, naLOXone (NARCAN)  injection, ondansetron (ZOFRAN) IV   Assessment/Plan:    1. Acute pancreatitis: ETOH-related. Symptoms have resolved, she has been restarted on a diet.  2. CAD: LHC---> chronically occluded LAD, moderate disease in a large OM 2 and severe disease and small posterior lateral vessel. - Stopped beta blocker due concern for low output. Continue ASA 81 and statin.  3. New acute systolic CHF:  Ischemic cardiomyopathy per LHC .  EF 20-25% with diffuse hypokinesis with some regionality. ? Component of ETOH-related cardiomyopathy.  RHC/LHC  Low output noted on with chronically occluded LAD, moderate disease in a large OM 2 and severe disease and small posterior lateral vessel. Yesterday carvedilol stopped due to dizziness which seems to have helped. Keep off carvedilol for now. Hopefully can add BB at outpatient follow up. Continue digoxin 0.125 mg daily.  Volume status stable. Plan to discharge to SNF will add 12.5 mg spironolactone today. Continue lisinopril 5 mg at bed time. Renal function stable.   Continue cardiac rehab.  May start lasix 20 mg every other day beginning tomorrow.  4. Smoking: Active smoker, some of her dyspnea may be related to COPD. Counseled to quit.  5. ETOH abuse: Counseled to quit.  6. Malnutrition --Albumin 2.5. Referred to dietitian for HF education.  7. Social Issues. She does not have insurance or transportation to get follow up appointments. Lives in a [place]. Will set up follow up with HF SW in clinic and refer to paramedicine. Will provide HF meds via HF fund.   8. Dizziness- resolved    Plan for SNF per primary team. Will need to use HF SNF discharge order set. HF clinic  follow up appointment --->  05/20/13 at 12:00  [name] D Clegg NP-C  7:48 AM  Patient seen with NP, agree with the above note.  She is doing better today.  Orthostatic symptoms resolved.  Will add spironolactone 12.5 mg daily as above and will start Lasix 20 mg every other day starting tomorrow. Awaiting SNF bed, will see in CHF clinic in 1 week.   Larey Dresser 05/14/2013 11:44 AM

## 2013-05-15 LAB — BASIC METABOLIC PANEL
BUN: 8 mg/dL (ref 6–23)
CO2: 21 mEq/L (ref 19–32)
Calcium: 8.9 mg/dL (ref 8.4–10.5)
Chloride: 100 mEq/L (ref 96–112)
Creatinine, Ser: 0.92 mg/dL (ref 0.50–1.10)
GFR calc Af Amer: 78 mL/min — ABNORMAL LOW (ref >90)
GFR, EST NON AFRICAN AMERICAN: 67 mL/min — AB (ref >90)
GLUCOSE: 123 mg/dL — AB (ref 70–99)
Potassium: 3.7 mEq/L (ref 3.7–5.3)
Sodium: 135 mEq/L — ABNORMAL LOW (ref 137–147)

## 2013-05-15 NOTE — Progress Notes (Signed)
Advanced Heart Failure Rounding Note   Subjective:   [name] is a 58 y/o female, with no prior cardiac history, who was admitted by [facility] on 05/06/13 for pancreatitis. Her w/u included a 2D echo which demonstrated severely reduced systolic function with an EF of 20-25%. Diffuse hypokinesis with akinesis of the basal inferior wall was noted. Doppler parameters were consistent with abnormal left ventricular relaxation (grade 1 diastolic dysfunction).   She has had difficulty tolerating HF meds due to dizziness. Dizziness is now improved.  Denies SOB/Orthopnea.   05/06/13 RHC/LHC  RA 7  RV  PA 31/19 (24) PCWP 10 PA Sat 53 Fick CO 2.90 CI 1.74  Thermo CO 2.4 CI 1.4   Objective:   Weight Range:  Vital Signs:   Temp:  [97.7 F (36.5 C)-99.2 F (37.3 C)] 99.2 F (37.3 C) (04/25 0718) Pulse Rate:  [80-90] 90 (04/25 0718) Resp:  [14-18] 18 (04/25 0718) BP: (113-135)/(60-88) 113/73 mmHg (04/25 0718) SpO2:  [95 %-98 %] 98 % (04/25 0718) Weight:  [138 lb 14.2 oz (63 kg)] 138 lb 14.2 oz (63 kg) (04/25 0441) Last BM Date: 05/14/13  Weight change: Filed Weights   05/14/13 0500 05/15/13 0003 05/15/13 0441  Weight: 140 lb 6.9 oz (63.7 kg) 138 lb 14.2 oz (63 kg) 138 lb 14.2 oz (63 kg)    Intake/Output:   Intake/Output Summary (Last 24 hours) at 05/15/13 0847 Last data filed at 05/15/13 0400  Gross per 24 hour  Intake    600 ml  Output      0 ml  Net    600 ml     Physical Exam: General:  Chronically ill appearing. No resp difficulty.  In bed  HEENT: normal Neck: supple. JVP 5-6 . Carotids 2+ bilat; no bruits. No lymphadenopathy or thryomegaly appreciated. Cor: PMI nondisplaced. Regular rate & rhythm. No rubs, gallops or murmurs. Lungs: clear Abdomen: soft, nontender, nondistended. No hepatosplenomegaly. No bruits or masses. Good bowel sounds. Extremities: no cyanosis, clubbing, rash, edema Neuro: alert & orientedx3, cranial nerves grossly intact. moves all 4 extremities w/o  difficulty. Affect pleasant  Telemetry: SR 80s  Labs: Basic Metabolic Panel:  Recent Labs Lab 05/09/13 0340 05/10/13 0249 05/11/13 0336 05/12/13 0500 05/13/13 0525 05/14/13 0356 05/15/13 0605  NA 135* 138 137 138 137 138 135*  K 3.2* 3.1* 3.9 3.2* 3.6* 3.8 3.7  CL 95* 95* 102 100 101 100 100  CO2 20 28 23 24 21 24 21   GLUCOSE 72 128* 109* 94 111* 119* 123*  BUN 14 16 17 18 13 11 8   CREATININE 0.83 0.94 0.78 0.91 0.90 0.91 0.92  CALCIUM 8.4 8.7 8.8 8.5 8.9 9.1 8.9  MG  --  2.2  --   --   --   --   --     Liver Function Tests:  Recent Labs Lab 05/09/13 0340 05/10/13 0249  AST 34 23  ALT 16 13  ALKPHOS 87 87  BILITOT 0.7 0.7  PROT 6.6 6.6  ALBUMIN 2.5* 2.5*   No results found for this basename: LIPASE, AMYLASE,  in the last 168 hours No results found for this basename: AMMONIA,  in the last 168 hours  CBC:  Recent Labs Lab 05/09/13 0340 05/10/13 0249  WBC 9.7 8.7  HGB 15.9* 15.2*  HCT 46.8* 44.9  MCV 98.3 98.2  PLT PLATELET CLUMPS NOTED ON SMEAR, COUNT APPEARS ADEQUATE 176    Cardiac Enzymes: No results found for this basename: CKTOTAL, CKMB, CKMBINDEX, TROPONINI,  in the last 168 hours  BNP: BNP (last 3 results)  Recent Labs  05/09/13 0340  PROBNP 8584.0*     Other results:    Imaging: No results found.   Medications:     Scheduled Medications: . aspirin  81 mg Oral Daily  . atorvastatin  40 mg Oral q1800  . digoxin  0.125 mg Oral Daily  . enoxaparin (LOVENOX) injection  40 mg Subcutaneous Q24H  . feeding supplement (RESOURCE BREEZE)  1 Container Oral TID BM  . folic acid  1 mg Oral Daily  . furosemide  20 mg Oral QODAY  . lisinopril  5 mg Oral QHS  . spironolactone  12.5 mg Oral Daily  . thiamine  100 mg Oral Daily    Infusions:    PRN Medications: acetaminophen, acetaminophen, bisacodyl, HYDROmorphone (DILAUDID) injection, levalbuterol, naLOXone (NARCAN)  injection, ondansetron (ZOFRAN) IV   Assessment/Plan:    1.  Acute pancreatitis: ETOH-related. Symptoms have resolved, she has been restarted on a diet.  2. CAD: LHC---> chronically occluded LAD, moderate disease in a large OM 2 and severe disease and small posterior lateral vessel. - Stopped beta blocker due concern for low output. Continue ASA 81 and statin.  3. New acute systolic CHF:  Ischemic cardiomyopathy per LHC .  EF 20-25% with diffuse hypokinesis with some regionality. ? Component of ETOH-related cardiomyopathy.  RHC/LHC  Low output noted on with chronically occluded LAD, moderate disease in a large OM 2 and severe disease and small posterior lateral vessel. Yesterday carvedilol stopped due to dizziness which seems to have helped. Keep off carvedilol for now. Hopefully can add BB at outpatient follow up. Continue digoxin 0.125 mg daily.  Volume status stable. Plan to discharge to SNF will add 12.5 mg spironolactone today. Continue lisinopril 5 mg at bed time. Renal function stable.   Continue cardiac rehab. Start lasix 20 mg every other day beginning today 4. Smoking: Active smoker, some of her dyspnea may be related to COPD. Counseled to quit.  5. ETOH abuse: Counseled to quit.  6. Malnutrition --Albumin 2.5. Referred to dietitian for HF education.  7. Social Issues. She does not have insurance or transportation to get follow up appointments. Lives in a [place]. Will set up follow up with HF SW in clinic and refer to paramedicine. Will provide HF meds via HF fund.   8. Dizziness- resolved    Cardiology to see as needed this weekend.  Please call with questions

## 2013-05-15 NOTE — Clinical Social Work Note (Signed)
CSW informed patient is ready for D/C to SNF bed at [facility]. CSW met with patient who is agreeable to plans. CSW discussed with patient, contacting husband to inform him of d/c plan. CSW contacted [facility] and confirmed bed availability. CSW faxed d/c summary to [facility] and provided RN with report and room number.  CSW to arrange transportion via EMS.    [place], [place] Weekend Clinical Social Worker [phone]

## 2013-05-15 NOTE — Progress Notes (Signed)
CARDIAC REHAB PHASE I   PRE:  Rate/Rhythm: 83 SR  BP:  Supine: 110/56  Sitting:   Standing:    SaO2: 95%RA  MODE:  Ambulation: 100 ft   POST:  Rate/Rhythm: 98  BP:  Supine:   Sitting: 116/66  Standing:    SaO2: 91-92%RA 0805-0825 Pt walked 100 ft on RA with rolling walker. C/o legs weak and could not go farther. Seems to do better later in day when she has been up a while. No DOE noted.    Graylon Good, RN BSN  05/15/2013 8:25 AM

## 2013-05-18 NOTE — Progress Notes (Signed)
Patient ID: [name], female   DOB: 1955/06/03, 58 y.o.   MRN: [id number]   HPI: [name] is a 58 y/o female, with history of ETOH abuse but with no prior cardiac history, who was admitted by [facility] on 05/06/13 for pancreatitis. She had severely reduced systolic function with an EF of 20-25%. She diuresed with IV lasix and transitioned to po lasix. 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 She was not placed on bb due to low output. She was able to tolerate lisinopril at bedtime and spironolactone 12.5 mg daily. She was discharged to [facility]. Discharge weight was 140 pounds.   She returns for post hospital follow up. Currently at [facility] SNF for rehab. When she completes rehab plans move to [facility]. Mild dyspnea with walking. Mild dizziness. Denies orthopnea /CP.  Doing great off tobacco and alcohol.  Medicaid is pending. Weight has been trending down from 139 to 134 pounds.   05/06/13 RHC/LHC  RA 7  RV  PA 31/19 (24)  PCWP 10  PA Sat 53  Fick CO 2.90 CI 1.74  Thermo CO 2.4 CI 1.4  LHC---> chronically occluded LAD, moderate disease in a large OM 2 and severe disease and small posterior lateral vessel  ECG: NSR, LVH (QRS not wide)  Labs 05/15/13 K 3.7 Creatinine 0.97  ROS: All systems negative except as listed in HPI, PMH and Problem List.  Past Medical History  Diagnosis Date  . Diverticulosis   . Pancreatitis   . Hypertension   . Stroke   . Hyperlipidemia   . Shortness of breath   . Anxiety   . GERD (gastroesophageal reflux disease)   . ETOH abuse     does not drink every day     Current Outpatient Prescriptions  Medication Sig Dispense Refill  . aspirin 81 MG chewable tablet Chew 1 tablet (81 mg total) by mouth daily.      Marland Kitchen atorvastatin (LIPITOR) 40 MG tablet Take 1 tablet (40 mg total) by mouth daily at 6 PM.      . bisacodyl (DULCOLAX) 5 MG EC tablet Take 2 tablets  (10 mg total) by mouth daily as needed for moderate constipation.  30 tablet  0  . digoxin (LANOXIN) 0.125 MG tablet Take 1 tablet (0.125 mg total) by mouth daily.      . folic acid (FOLVITE) 1 MG tablet Take 1 tablet (1 mg total) by mouth daily.      . furosemide (LASIX) 20 MG tablet Take 1 tablet (20 mg total) by mouth every other day.  30 tablet    . levalbuterol (XOPENEX) 0.63 MG/3ML nebulizer solution Take 3 mLs (0.63 mg total) by nebulization every 6 (six) hours as needed for wheezing or shortness of breath.  3 mL  12  . lisinopril (PRINIVIL,ZESTRIL) 5 MG tablet Take 1 tablet (5 mg total) by mouth at bedtime.      Marland Kitchen spironolactone (ALDACTONE) 12.5 mg TABS tablet Take 0.5 tablets (12.5 mg total) by mouth daily.      Marland Kitchen thiamine 100 MG tablet Take 1 tablet (100 mg total) by mouth daily.       No current facility-administered medications for this encounter.     PHYSICAL EXAM: Filed Vitals:   05/20/13 1206  BP: 96/59  Pulse: 68  Resp: 16  Weight: 134 lb 4 oz (60.895 kg)  SpO2: 98%    General:  Well appearing. No resp difficulty HEENT: normal Neck: supple. JVP flat. Carotids 2+ bilaterally; no bruits. No lymphadenopathy or thryomegaly appreciated. Cor: PMI normal. Regular rate & rhythm. No rubs, gallops or murmurs. Lungs: clear Abdomen: soft, nontender, nondistended. No hepatosplenomegaly. No bruits or masses. Good bowel sounds. Extremities: no cyanosis, clubbing, rash, edema Neuro: alert & orientedx3, cranial nerves grossly intact. Moves all 4 extremities w/o difficulty. Affect pleasant.   ASSESSMENT & PLAN:  1. Chronic Systolic Heart Failure: Primarily ischemic cardiomyopathy though ETOH may play a role.  ECHO EF 20-25%, NYHA II-III symptoms.  She had low output on RHC.  She was started on digoxin in the hospital.  Overall she doing much better. Increased stamina reported with rehab. Volume status stable.  - Continue lasix 20 mg qod and spironolactone 12.5 mg daily .   -  Continue digoxin 0.125 mg daily - Continue lisinopril 5 mg at bed time. Still with mild dizziness, will not titrate up.  - Hold off on beta blocker for now with low output. Would like to start eventually at low dose.  - WIll call [facility] for lab results. Will need dig level and BMET.  - She will need to stay off ETOH as this certainly may have played a role in her cardiomyopathy.  - Reinforced daily weights, low salt food choices, and limiting fluid intake to < 2 liters per day,  - Plan to repeat ECHO within 3 months after HF meds optimized.  She was counseled to remain abstinent from ETOH.  If she is able to do so, she will be candidate for ICD placement.  2. CAD: LHC with chronically occluded LAD, moderate disease in a large OM 2 and severe disease and small posterior lateral vessel. She did not have good targets for revascularization. Continue aspirin and statin. She will need lipids/LFTs in 2 months.   3. Former Smoker: Encouraged to remain off tobacco.  4. Acute pancreatitis: Due to ETOH, now resolved.   5. Social Issues: Plan to d/c to [facility] after she completes rehab. She will need to be followed closely in HF clinic.   Follow up in 2 weeks and will try to add bb.   Conrad Overton NP-C  12:33 PM  Patient seen with NP, agree with the above note.  [name] is doing well, no chest pain. Dyspnea improved, notices with longer distances. Occasional lightheadedness with standing.  Looks euvolemic.  Agree with continuing current med regimen.  Will need BMET and digoxin level, may have been drawn at SNF, will investigate. She will need followup in 2 wks, will try to add low dose Coreg at that time.   Larey Dresser 05/20/2013 5:20 PM

## 2013-05-19 ENCOUNTER — Inpatient Hospital Stay: Payer: Self-pay | Admitting: Internal Medicine

## 2013-05-20 ENCOUNTER — Ambulatory Visit (HOSPITAL_COMMUNITY)
Admission: RE | Admit: 2013-05-20 | Discharge: 2013-05-20 | Disposition: A | Discharge status: Home / Self Care | Payer: Medicaid Other | Source: Ambulatory Visit | Attending: Cardiology | Admitting: Cardiology

## 2013-05-20 ENCOUNTER — Encounter (HOSPITAL_COMMUNITY): Payer: Self-pay

## 2013-05-20 VITALS — BP 96/59 | HR 68 | Resp 16 | Wt 134.2 lb

## 2013-05-20 DIAGNOSIS — F1010 Alcohol abuse, uncomplicated: Secondary | ICD-10-CM | POA: Diagnosis not present

## 2013-05-20 DIAGNOSIS — I5021 Acute systolic (congestive) heart failure: Secondary | ICD-10-CM

## 2013-05-20 DIAGNOSIS — I5023 Acute on chronic systolic (congestive) heart failure: Secondary | ICD-10-CM | POA: Diagnosis not present

## 2013-05-20 DIAGNOSIS — F17200 Nicotine dependence, unspecified, uncomplicated: Secondary | ICD-10-CM

## 2013-05-20 DIAGNOSIS — I5022 Chronic systolic (congestive) heart failure: Secondary | ICD-10-CM

## 2013-05-20 DIAGNOSIS — Z720 Tobacco use: Secondary | ICD-10-CM

## 2013-05-20 DIAGNOSIS — I509 Heart failure, unspecified: Secondary | ICD-10-CM

## 2013-05-25 ENCOUNTER — Ambulatory Visit: Payer: Self-pay

## 2013-05-31 NOTE — Progress Notes (Signed)
Patient ID: [name], female   DOB: 01-10-1956, 58 y.o.   MRN: [id number]   HPI: [name] is a 58 y/o female, with history of ETOH abuse but with no prior cardiac history, who was admitted by [facility] on 05/06/13 for pancreatitis. She had severely reduced systolic function with an EF of 20-25%. She diuresed with IV lasix and transitioned to po lasix. 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 She was not placed on bb due to low output. She was able to tolerate lisinopril at bedtime and spironolactone 12.5 mg daily. She was discharged to [facility]. Discharge weight was 140 pounds.   She returns for follow up. Currently at [facility] SNF for rehab. Possible d/c today? Can walk 20 minutes at a time twice a day. She thought she was going to  [facility] but she has not been told what the plan is. . Denies SOB/PND/Orthopnea/CP . Mild dyspnea with hills. Doing great off tobacco and alcohol.  Medicaid is pending. Weight 132-135 pounds.  She can not pay for medications.   05/06/13 RHC/LHC  RA 7  RV  PA 31/19 (24)  PCWP 10  PA Sat 53  Fick CO 2.90 CI 1.74  Thermo CO 2.4 CI 1.4  LHC---> chronically occluded LAD, moderate disease in a large OM 2 and severe disease and small posterior lateral vessel  Labs 05/15/13 K 3.7 Creatinine 0.97 Labs 06/02/13 K 3.9 Creatinine 1.0 Dig level 1.3 --> cut back dig to 0.0625 mg  ROS: All systems negative except as listed in HPI, PMH and Problem List.  Past Medical History  Diagnosis Date  . Diverticulosis   . Pancreatitis   . Hypertension   . Stroke   . Hyperlipidemia   . Shortness of breath   . Anxiety   . GERD (gastroesophageal reflux disease)   . ETOH abuse     does not drink every day     Current Outpatient Prescriptions  Medication Sig Dispense Refill  . aspirin 81 MG chewable tablet Chew 1 tablet (81 mg total) by mouth daily.      Marland Kitchen atorvastatin (LIPITOR)  40 MG tablet Take 1 tablet (40 mg total) by mouth daily at 6 PM.      . bisacodyl (DULCOLAX) 5 MG EC tablet Take 2 tablets (10 mg total) by mouth daily as needed for moderate constipation.  30 tablet  0  . digoxin (LANOXIN) 0.125 MG tablet Take 1 tablet (0.125 mg total) by mouth daily.      . folic acid (FOLVITE) 1 MG tablet Take 1 tablet (1 mg total) by mouth daily.      . furosemide (LASIX) 20 MG tablet Take 1 tablet (20 mg total) by mouth every other day.  30 tablet    . levalbuterol (XOPENEX) 0.63 MG/3ML nebulizer solution Take 3 mLs (0.63 mg total) by nebulization every 6 (six) hours as needed for wheezing or shortness of breath.  3 mL  12  . lisinopril (PRINIVIL,ZESTRIL) 5 MG tablet Take 1 tablet (5 mg total) by mouth at bedtime.      Marland Kitchen spironolactone (ALDACTONE) 12.5 mg TABS tablet Take 0.5 tablets (12.5 mg total) by mouth daily.      Marland Kitchen thiamine 100 MG tablet Take 1 tablet (100 mg total) by mouth daily.       No current facility-administered medications for this encounter.     PHYSICAL EXAM: Filed Vitals:  06/02/13 1053  BP: 113/75  Pulse: 80  Resp: 16  Weight: 133 lb 4 oz (60.442 kg)  SpO2: 99%    General:  Well appearing. No resp difficulty HEENT: normal Neck: supple. JVP flat. Carotids 2+ bilaterally; no bruits. No lymphadenopathy or thryomegaly appreciated. Cor: PMI normal. Regular rate & rhythm. No rubs, gallops or murmurs. Lungs: clear Abdomen: soft, nontender, nondistended. No hepatosplenomegaly. No bruits or masses. Good bowel sounds. Extremities: no cyanosis, clubbing, rash, edema Neuro: alert & orientedx3, cranial nerves grossly intact. Moves all 4 extremities w/o difficulty. Affect pleasant.   ASSESSMENT & PLAN:  1. Chronic Systolic Heart Failure: Primarily  ICM though ETOH may play a role.  ECHO 05/08/13  EF 20-25%,  She had low output on RHC.    Overall she is doing great. NYHA I-II. Volume status stable.  Continue lasix 20 mg qod and spironolactone 12.5 mg  daily.  - Add carvedilol 3.125 mg twice a day. Continue digoxin 0.125 mg daily. Check dig level now.  - Continue lisinopril 5 mg at bed time. Dizziness resolved. .  - She will need to stay off ETOH as this certainly may have played a role in her cardiomyopathy.  - Reinforced daily weights, low salt food choices, and limiting fluid intake to < 2 liters per day,  - Plan to repeat ECHO within 3 months after HF meds optimized. Should be around the end of July.    Check Pro BNP and BMET today Referred to HF SW for assistance with disposition and rehab.  2. CAD: LHC with chronically occluded LAD, moderate disease in a large OM 2 and severe disease and small posterior lateral vessel. She did not have good targets for revascularization. Continue aspirin and statin. She will need lipids/LFTs in another month.     3. Former Smoker: Encouraged to remain off tobacco.  4. Acute pancreatitis: Due to ETOH, now resolved.   5. Social Issues: Plan to d/c to [facility] after she completes rehab. She will need to be followed closely in HF clinic. Referred to HF SW to assist with disposition into the community.  6. Former Alcohol Abuse- Needs to remain completely off alcohol.   Follow up in 2 weeks.   Dig level 1.3--> Cut back digoxin to 0.0625 mg daily. Repeat dig level next visit.   Marland Kitchen Amy D Clegg NP-C  11:13 AM

## 2013-06-02 ENCOUNTER — Encounter (HOSPITAL_COMMUNITY): Payer: Self-pay

## 2013-06-02 ENCOUNTER — Encounter: Payer: Self-pay | Admitting: Licensed Clinical Social Worker

## 2013-06-02 ENCOUNTER — Ambulatory Visit (HOSPITAL_COMMUNITY)
Admission: RE | Admit: 2013-06-02 | Discharge: 2013-06-02 | Disposition: A | Discharge status: Home / Self Care | Payer: Medicaid Other | Source: Ambulatory Visit | Attending: Internal Medicine | Admitting: Internal Medicine

## 2013-06-02 ENCOUNTER — Telehealth (HOSPITAL_COMMUNITY): Payer: Self-pay

## 2013-06-02 VITALS — BP 113/75 | HR 80 | Resp 16 | Wt 133.2 lb

## 2013-06-02 DIAGNOSIS — Z87891 Personal history of nicotine dependence: Secondary | ICD-10-CM | POA: Diagnosis not present

## 2013-06-02 DIAGNOSIS — Z720 Tobacco use: Secondary | ICD-10-CM

## 2013-06-02 DIAGNOSIS — I5022 Chronic systolic (congestive) heart failure: Secondary | ICD-10-CM | POA: Insufficient documentation

## 2013-06-02 DIAGNOSIS — F1010 Alcohol abuse, uncomplicated: Secondary | ICD-10-CM

## 2013-06-02 DIAGNOSIS — I509 Heart failure, unspecified: Secondary | ICD-10-CM

## 2013-06-02 DIAGNOSIS — I2510 Atherosclerotic heart disease of native coronary artery without angina pectoris: Secondary | ICD-10-CM | POA: Diagnosis not present

## 2013-06-02 DIAGNOSIS — F17200 Nicotine dependence, unspecified, uncomplicated: Secondary | ICD-10-CM

## 2013-06-02 LAB — BASIC METABOLIC PANEL
BUN: 9 mg/dL (ref 6–23)
CO2: 27 mEq/L (ref 19–32)
Calcium: 9.9 mg/dL (ref 8.4–10.5)
Chloride: 101 mEq/L (ref 96–112)
Creatinine, Ser: 1 mg/dL (ref 0.50–1.10)
GFR, EST AFRICAN AMERICAN: 71 mL/min — AB (ref >90)
GFR, EST NON AFRICAN AMERICAN: 61 mL/min — AB (ref >90)
Glucose, Bld: 85 mg/dL (ref 70–99)
POTASSIUM: 3.9 meq/L (ref 3.7–5.3)
SODIUM: 141 meq/L (ref 137–147)

## 2013-06-02 LAB — DIGOXIN LEVEL: Digoxin Level: 1.3 ng/mL (ref 0.8–2.0)

## 2013-06-02 MED ORDER — CARVEDILOL 3.125 MG PO TABS: 3.1250 mg | ORAL_TABLET | Freq: Two times a day (BID) | ORAL | Status: DC

## 2013-06-02 NOTE — Telephone Encounter (Signed)
Nurse taking care of patient at [facility] SNF made aware of med change-reduce digoxin to 0.0625 PO once daily.  Written script faxed to facility as well. Renee Pain

## 2013-06-02 NOTE — Progress Notes (Signed)
CSW referred to assist patient with resources and long term planning. Patient reports she was hospitalized and then sent to [facility] for rehab. "They said I was done with rehab and I don't know where I go next". Patient reports that her husband lives in a hotel and attends [employer] and lives on student loans. Patient with no income and no other support reports she would like to "stay on my own and get better". CSW contacted Solicitor at [facility] who states that she is searching for an assisted living or group home environment for patient to be discharged but at the moment she will continue to remain at [facility]. CSW will explore options and follow up with social worker at [facility] regarding placement, disability and medicaid. Patient very appreciative of support and assistance.  Raquel Sarna, [place]

## 2013-06-02 NOTE — Patient Instructions (Signed)
Follow up in 2 weeks  Take carvedilol 3.125 mg twice a day  Do the following things EVERYDAY: 1) Weigh yourself in the morning before breakfast. Write it down and keep it in a log. 2) Take your medicines as prescribed 3) Eat low salt foods-Limit salt (sodium) to 2000 mg per day.  4) Stay as active as you can everyday 5) Limit all fluids for the day to less than 2 liters

## 2013-06-05 ENCOUNTER — Inpatient Hospital Stay (HOSPITAL_COMMUNITY)
Admission: EM | Admit: 2013-06-05 | Discharge: 2013-06-08 | DRG: 309 | Disposition: A | Discharge status: Skilled Nursing Facility | Payer: Medicaid Other | Attending: Internal Medicine | Admitting: Internal Medicine

## 2013-06-05 ENCOUNTER — Emergency Department (HOSPITAL_COMMUNITY): Payer: Medicaid Other

## 2013-06-05 ENCOUNTER — Encounter (HOSPITAL_COMMUNITY): Payer: Self-pay | Admitting: Emergency Medicine

## 2013-06-05 DIAGNOSIS — F17200 Nicotine dependence, unspecified, uncomplicated: Secondary | ICD-10-CM | POA: Diagnosis present

## 2013-06-05 DIAGNOSIS — I4891 Unspecified atrial fibrillation: Principal | ICD-10-CM | POA: Diagnosis present

## 2013-06-05 DIAGNOSIS — I509 Heart failure, unspecified: Secondary | ICD-10-CM | POA: Diagnosis present

## 2013-06-05 DIAGNOSIS — J9600 Acute respiratory failure, unspecified whether with hypoxia or hypercapnia: Secondary | ICD-10-CM

## 2013-06-05 DIAGNOSIS — F411 Generalized anxiety disorder: Secondary | ICD-10-CM | POA: Diagnosis present

## 2013-06-05 DIAGNOSIS — Z8249 Family history of ischemic heart disease and other diseases of the circulatory system: Secondary | ICD-10-CM

## 2013-06-05 DIAGNOSIS — R7989 Other specified abnormal findings of blood chemistry: Secondary | ICD-10-CM

## 2013-06-05 DIAGNOSIS — K859 Acute pancreatitis without necrosis or infection, unspecified: Secondary | ICD-10-CM

## 2013-06-05 DIAGNOSIS — IMO0002 Reserved for concepts with insufficient information to code with codable children: Secondary | ICD-10-CM

## 2013-06-05 DIAGNOSIS — K219 Gastro-esophageal reflux disease without esophagitis: Secondary | ICD-10-CM | POA: Diagnosis present

## 2013-06-05 DIAGNOSIS — D72829 Elevated white blood cell count, unspecified: Secondary | ICD-10-CM

## 2013-06-05 DIAGNOSIS — R6510 Systemic inflammatory response syndrome (SIRS) of non-infectious origin without acute organ dysfunction: Secondary | ICD-10-CM

## 2013-06-05 DIAGNOSIS — E876 Hypokalemia: Secondary | ICD-10-CM | POA: Diagnosis present

## 2013-06-05 DIAGNOSIS — Z8673 Personal history of transient ischemic attack (TIA), and cerebral infarction without residual deficits: Secondary | ICD-10-CM

## 2013-06-05 DIAGNOSIS — Z720 Tobacco use: Secondary | ICD-10-CM

## 2013-06-05 DIAGNOSIS — Z79899 Other long term (current) drug therapy: Secondary | ICD-10-CM

## 2013-06-05 DIAGNOSIS — I5023 Acute on chronic systolic (congestive) heart failure: Secondary | ICD-10-CM | POA: Diagnosis present

## 2013-06-05 DIAGNOSIS — Z880 Allergy status to penicillin: Secondary | ICD-10-CM

## 2013-06-05 DIAGNOSIS — I2589 Other forms of chronic ischemic heart disease: Secondary | ICD-10-CM | POA: Diagnosis present

## 2013-06-05 DIAGNOSIS — I209 Angina pectoris, unspecified: Secondary | ICD-10-CM

## 2013-06-05 DIAGNOSIS — F1010 Alcohol abuse, uncomplicated: Secondary | ICD-10-CM | POA: Diagnosis present

## 2013-06-05 DIAGNOSIS — E440 Moderate protein-calorie malnutrition: Secondary | ICD-10-CM | POA: Diagnosis present

## 2013-06-05 DIAGNOSIS — I2510 Atherosclerotic heart disease of native coronary artery without angina pectoris: Secondary | ICD-10-CM | POA: Diagnosis present

## 2013-06-05 DIAGNOSIS — I10 Essential (primary) hypertension: Secondary | ICD-10-CM | POA: Diagnosis present

## 2013-06-05 DIAGNOSIS — I480 Paroxysmal atrial fibrillation: Secondary | ICD-10-CM | POA: Diagnosis present

## 2013-06-05 DIAGNOSIS — R748 Abnormal levels of other serum enzymes: Secondary | ICD-10-CM | POA: Diagnosis present

## 2013-06-05 DIAGNOSIS — K5730 Diverticulosis of large intestine without perforation or abscess without bleeding: Secondary | ICD-10-CM | POA: Diagnosis present

## 2013-06-05 DIAGNOSIS — I5022 Chronic systolic (congestive) heart failure: Secondary | ICD-10-CM | POA: Diagnosis present

## 2013-06-05 DIAGNOSIS — Z7982 Long term (current) use of aspirin: Secondary | ICD-10-CM

## 2013-06-05 DIAGNOSIS — I5021 Acute systolic (congestive) heart failure: Secondary | ICD-10-CM

## 2013-06-05 DIAGNOSIS — R778 Other specified abnormalities of plasma proteins: Secondary | ICD-10-CM | POA: Diagnosis present

## 2013-06-05 DIAGNOSIS — N390 Urinary tract infection, site not specified: Secondary | ICD-10-CM | POA: Diagnosis present

## 2013-06-05 DIAGNOSIS — Z7901 Long term (current) use of anticoagulants: Secondary | ICD-10-CM

## 2013-06-05 DIAGNOSIS — E785 Hyperlipidemia, unspecified: Secondary | ICD-10-CM | POA: Diagnosis present

## 2013-06-05 HISTORY — DX: Heart failure, unspecified: I50.9

## 2013-06-05 LAB — CBC WITH DIFFERENTIAL/PLATELET
BASOS ABS: 0 10*3/uL (ref 0.0–0.1)
BASOS PCT: 1 % (ref 0–1)
Eosinophils Absolute: 0.7 10*3/uL (ref 0.0–0.7)
Eosinophils Relative: 11 % — ABNORMAL HIGH (ref 0–5)
HCT: 39.3 % (ref 36.0–46.0)
Hemoglobin: 13.3 g/dL (ref 12.0–15.0)
LYMPHS ABS: 1.1 10*3/uL (ref 0.7–4.0)
Lymphocytes Relative: 16 % (ref 12–46)
MCH: 32 pg (ref 26.0–34.0)
MCHC: 33.8 g/dL (ref 30.0–36.0)
MCV: 94.5 fL (ref 78.0–100.0)
Monocytes Absolute: 0.6 10*3/uL (ref 0.1–1.0)
Monocytes Relative: 8 % (ref 3–12)
NEUTROS PCT: 64 % (ref 43–77)
Neutro Abs: 4.4 10*3/uL (ref 1.7–7.7)
Platelets: 215 10*3/uL (ref 150–400)
RBC: 4.16 MIL/uL (ref 3.87–5.11)
RDW: 12.8 % (ref 11.5–15.5)
WBC: 6.8 10*3/uL (ref 4.0–10.5)

## 2013-06-05 LAB — BASIC METABOLIC PANEL
BUN: 11 mg/dL (ref 6–23)
CO2: 21 mEq/L (ref 19–32)
Calcium: 9.2 mg/dL (ref 8.4–10.5)
Chloride: 100 mEq/L (ref 96–112)
Creatinine, Ser: 1.02 mg/dL (ref 0.50–1.10)
GFR, EST AFRICAN AMERICAN: 69 mL/min — AB (ref >90)
GFR, EST NON AFRICAN AMERICAN: 59 mL/min — AB (ref >90)
Glucose, Bld: 123 mg/dL — ABNORMAL HIGH (ref 70–99)
POTASSIUM: 3.6 meq/L — AB (ref 3.7–5.3)
Sodium: 136 mEq/L — ABNORMAL LOW (ref 137–147)

## 2013-06-05 LAB — URINALYSIS, ROUTINE W REFLEX MICROSCOPIC
BILIRUBIN URINE: NEGATIVE
Glucose, UA: NEGATIVE mg/dL
KETONES UR: NEGATIVE mg/dL
NITRITE: NEGATIVE
Protein, ur: NEGATIVE mg/dL
Specific Gravity, Urine: 1.016 (ref 1.005–1.030)
UROBILINOGEN UA: 0.2 mg/dL (ref 0.0–1.0)
pH: 5 (ref 5.0–8.0)

## 2013-06-05 LAB — URINE MICROSCOPIC-ADD ON

## 2013-06-05 LAB — TROPONIN I: Troponin I: <0.3 ng/mL (ref <0.30)

## 2013-06-05 LAB — PROTIME-INR
INR: 0.86 (ref 0.00–1.49)
PROTHROMBIN TIME: 11.6 s (ref 11.6–15.2)

## 2013-06-05 LAB — DIGOXIN LEVEL: DIGOXIN LVL: 0.4 ng/mL — AB (ref 0.8–2.0)

## 2013-06-05 MED ORDER — POTASSIUM CHLORIDE 10 MEQ/100ML IV SOLN: 10.0000 meq | Freq: Once | INTRAVENOUS | Status: DC

## 2013-06-05 NOTE — ED Provider Notes (Signed)
CSN: [id number]     Arrival date & time 06/05/13  2139 History   First MD Initiated Contact with Patient 06/05/13 2139     Chief Complaint  Patient presents with  . Tachycardia  . Atrial Fibrillation     (Consider location/radiation/quality/duration/timing/severity/associated sxs/prior Treatment) Patient is a 58 y.o. female presenting with atrial fibrillation. The history is provided by the patient.  Atrial Fibrillation This is a new problem. Associated symptoms include chest pain and shortness of breath. Pertinent negatives include no abdominal pain and no headaches.   patient presented with shortness of breath and chest pain. Occurred at nursing home. Found to be in atrial fibrillation with RVR. Given Cardizem, which slowed patient patient began to feel better. The chest pain resolved. She states she's feeling somewhat better now. 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 She was seen in the heart failure clinic 3 days ago. No abdominal pain. No nausea vomiting. No cough. Patient has not drank in the last month.  Past Medical History  Diagnosis Date  . Diverticulosis   . Pancreatitis   . Hypertension   . Stroke   . Hyperlipidemia   . Shortness of breath   . Anxiety   . GERD (gastroesophageal reflux disease)   . ETOH abuse     does not drink every day   . CHF (congestive heart failure)    Past Surgical History  Procedure Laterality Date  . Colostomy    . Ileostomy    . Colostomy takedown     Family History  Problem Relation Age of Onset  . Coronary artery disease Father 53    MI   History  Substance Use Topics  . Smoking status: Current Every Day Smoker -- 1.00 packs/day for 40 years    Types: Cigarettes  . Smokeless tobacco: Never Used  . Alcohol Use: Yes     Comment: 2 3 TIMES A WEEK   OB History   Grav Para Term Preterm Abortions TAB SAB Ect Mult Living                 Review of Systems  Constitutional: Negative  for activity change and appetite change.  Eyes: Negative for pain.  Respiratory: Positive for shortness of breath. Negative for chest tightness.   Cardiovascular: Positive for chest pain. Negative for leg swelling.  Gastrointestinal: Negative for nausea, vomiting, abdominal pain and diarrhea.  Genitourinary: Negative for flank pain.  Musculoskeletal: Negative for back pain and neck stiffness.  Skin: Negative for rash.  Neurological: Negative for weakness, numbness and headaches.  Psychiatric/Behavioral: Negative for behavioral problems.      Allergies  Penicillins  Home Medications   Prior to Admission medications   Medication Sig Start Date End Date Taking? Authorizing Provider  aspirin 81 MG chewable tablet Chew 1 tablet (81 mg total) by mouth daily. 05/14/13  Yes Geradine Girt, DO  atorvastatin (LIPITOR) 40 MG tablet Take 1 tablet (40 mg total) by mouth daily at 6 PM. 05/14/13  Yes Geradine Girt, DO  bisacodyl (DULCOLAX) 5 MG EC tablet Take 2 tablets (10 mg total) by mouth daily as needed for moderate constipation. 05/14/13  Yes Geradine Girt, DO  digoxin (LANOXIN) 0.125 MG tablet Take 0.0625 mg by mouth daily.   Yes Historical Provider, MD  folic acid (FOLVITE) 1 MG tablet Take 1 tablet (1 mg total) by mouth daily. 05/14/13  Yes Geradine Girt, DO  furosemide (LASIX) 20 MG tablet  Take 1 tablet (20 mg total) by mouth every other day. 05/15/13  Yes Geradine Girt, DO  levalbuterol (XOPENEX) 0.63 MG/3ML nebulizer solution Take 3 mLs (0.63 mg total) by nebulization every 6 (six) hours as needed for wheezing or shortness of breath. 05/14/13  Yes Geradine Girt, DO  lisinopril (PRINIVIL,ZESTRIL) 5 MG tablet Take 1 tablet (5 mg total) by mouth at bedtime. 05/14/13  Yes Geradine Girt, DO  promethazine (PHENERGAN) 25 MG tablet Take 25 mg by mouth every 6 (six) hours as needed for nausea or vomiting.   Yes Historical Provider, MD  spironolactone (ALDACTONE) 12.5 mg TABS tablet Take 0.5 tablets  (12.5 mg total) by mouth daily. 05/14/13  Yes Geradine Girt, DO  thiamine 100 MG tablet Take 1 tablet (100 mg total) by mouth daily. 05/14/13  Yes Geradine Girt, DO  carvedilol (COREG) 3.125 MG tablet Take 1 tablet (3.125 mg total) by mouth 2 (two) times daily with a meal. 06/02/13   Amy D Clegg, NP   BP 176/63  Pulse 77  Temp(Src) 98 F (36.7 C) (Oral)  Resp 20  Ht 5\' 3"  (1.6 m)  Wt 132 lb (59.875 kg)  BMI 23.39 kg/m2  SpO2 100% Physical Exam  Nursing note and vitals reviewed. Constitutional: She is oriented to person, place, and time. She appears well-developed and well-nourished.  HENT:  Head: Normocephalic and atraumatic.  Eyes: EOM are normal. Pupils are equal, round, and reactive to light.  Neck: Normal range of motion. Neck supple.  No JVD  Cardiovascular: Normal rate, regular rhythm and normal heart sounds.   No murmur heard. Pulmonary/Chest: Effort normal and breath sounds normal. No respiratory distress. She has no wheezes. She has no rales.  Abdominal: Soft. Bowel sounds are normal. She exhibits no distension. There is no tenderness. There is no rebound and no guarding.  Musculoskeletal: Normal range of motion. She exhibits edema.  Mild bilateral lower extremity pitting edema  Neurological: She is alert and oriented to person, place, and time. No cranial nerve deficit.  Skin: Skin is warm and dry.  Psychiatric: She has a normal mood and affect. Her speech is normal.    ED Course  Procedures (including critical care time) Labs Review Labs Reviewed  CBC WITH DIFFERENTIAL - Abnormal; Notable for the following:    Eosinophils Relative 11 (*)    All other components within normal limits  BASIC METABOLIC PANEL - Abnormal; Notable for the following:    Sodium 136 (*)    Potassium 3.6 (*)    Glucose, Bld 123 (*)    GFR calc non Af Amer 59 (*)    GFR calc Af Amer 69 (*)    All other components within normal limits  DIGOXIN LEVEL - Abnormal; Notable for the following:     Digoxin Level 0.4 (*)    All other components within normal limits  URINALYSIS, ROUTINE W REFLEX MICROSCOPIC - Abnormal; Notable for the following:    APPearance CLOUDY (*)    Hgb urine dipstick TRACE (*)    Leukocytes, UA LARGE (*)    All other components within normal limits  URINE MICROSCOPIC-ADD ON - Abnormal; Notable for the following:    Squamous Epithelial / LPF FEW (*)    Bacteria, UA MANY (*)    Casts HYALINE CASTS (*)    All other components within normal limits  URINE CULTURE  PROTIME-INR  TROPONIN I  TROPONIN I  TROPONIN I  TROPONIN I    Imaging  Review Dg Chest Port 1 View  06/05/2013   CLINICAL DATA:  Shortness of breath.  EXAM: PORTABLE CHEST - 1 VIEW  COMPARISON:  05/10/2013.  FINDINGS: Normal sized heart. Clear lungs with normal vascularity. Normal appearing bones  IMPRESSION: Normal examination.   Electronically Signed   By: Enrique Sack M.D.   On: 06/05/2013 23:18     EKG Interpretation   Date/Time:  Saturday Jun 05 2013 21:45:20 EDT Ventricular Rate:  115 PR Interval:  148 QRS Duration: 160 QT Interval:  377 QTC Calculation: 521 R Axis:   66 Text Interpretation:  Sinus tachycardia IVCD, consider atypical LBBB  Confirmed by Correen Bubolz  MD, Ovid Curd [phone]) on 06/05/2013 10:02:44 PM      MDM   Final diagnoses:  Atrial fibrillation with RVR  Hypokalemia  UTI (urinary tract infection)  Chronic systolic CHF (congestive heart failure)   Patient presented after episode of atrial fibrillation with RVR at nursing home. Rate was controlled by EMS and converted back to sinus. She feels much better. She has a CHADS-Vasc score of 6. Potasium will be supplemented. May need help affording anticoagulation. Also has UTI. Will admit.  Jasper Riling. Alvino Chapel, MD 06/06/13 [phone]

## 2013-06-05 NOTE — ED Notes (Signed)
Per ems, pt from [facility]. Called ems c/o CP, SOB. Upon arrival, pt was Afib with RVR in 140s, pt given 20mg  cardizem, HR down to 108. CP resolved with HR drop. Pt denies CP at this time. Pt is AAOX4, in NAD.

## 2013-06-05 NOTE — ED Notes (Signed)
At Bedside.

## 2013-06-06 ENCOUNTER — Encounter (HOSPITAL_COMMUNITY): Payer: Self-pay | Admitting: Emergency Medicine

## 2013-06-06 DIAGNOSIS — R000 Tachycardia, unspecified: Secondary | ICD-10-CM | POA: Diagnosis present

## 2013-06-06 DIAGNOSIS — E440 Moderate protein-calorie malnutrition: Secondary | ICD-10-CM | POA: Diagnosis present

## 2013-06-06 DIAGNOSIS — Z8249 Family history of ischemic heart disease and other diseases of the circulatory system: Secondary | ICD-10-CM | POA: Diagnosis not present

## 2013-06-06 DIAGNOSIS — N390 Urinary tract infection, site not specified: Secondary | ICD-10-CM | POA: Diagnosis present

## 2013-06-06 DIAGNOSIS — Z8673 Personal history of transient ischemic attack (TIA), and cerebral infarction without residual deficits: Secondary | ICD-10-CM | POA: Diagnosis not present

## 2013-06-06 DIAGNOSIS — K5730 Diverticulosis of large intestine without perforation or abscess without bleeding: Secondary | ICD-10-CM | POA: Diagnosis present

## 2013-06-06 DIAGNOSIS — I509 Heart failure, unspecified: Secondary | ICD-10-CM

## 2013-06-06 DIAGNOSIS — Z7982 Long term (current) use of aspirin: Secondary | ICD-10-CM | POA: Diagnosis not present

## 2013-06-06 DIAGNOSIS — IMO0002 Reserved for concepts with insufficient information to code with codable children: Secondary | ICD-10-CM | POA: Diagnosis not present

## 2013-06-06 DIAGNOSIS — Z7901 Long term (current) use of anticoagulants: Secondary | ICD-10-CM | POA: Diagnosis not present

## 2013-06-06 DIAGNOSIS — I2589 Other forms of chronic ischemic heart disease: Secondary | ICD-10-CM | POA: Diagnosis present

## 2013-06-06 DIAGNOSIS — F1010 Alcohol abuse, uncomplicated: Secondary | ICD-10-CM | POA: Diagnosis present

## 2013-06-06 DIAGNOSIS — I4891 Unspecified atrial fibrillation: Secondary | ICD-10-CM | POA: Diagnosis present

## 2013-06-06 DIAGNOSIS — Z880 Allergy status to penicillin: Secondary | ICD-10-CM | POA: Diagnosis not present

## 2013-06-06 DIAGNOSIS — F17200 Nicotine dependence, unspecified, uncomplicated: Secondary | ICD-10-CM | POA: Diagnosis present

## 2013-06-06 DIAGNOSIS — R778 Other specified abnormalities of plasma proteins: Secondary | ICD-10-CM | POA: Diagnosis present

## 2013-06-06 DIAGNOSIS — I480 Paroxysmal atrial fibrillation: Secondary | ICD-10-CM | POA: Diagnosis present

## 2013-06-06 DIAGNOSIS — I428 Other cardiomyopathies: Secondary | ICD-10-CM

## 2013-06-06 DIAGNOSIS — K219 Gastro-esophageal reflux disease without esophagitis: Secondary | ICD-10-CM | POA: Diagnosis present

## 2013-06-06 DIAGNOSIS — I5022 Chronic systolic (congestive) heart failure: Secondary | ICD-10-CM

## 2013-06-06 DIAGNOSIS — E785 Hyperlipidemia, unspecified: Secondary | ICD-10-CM | POA: Diagnosis present

## 2013-06-06 DIAGNOSIS — E876 Hypokalemia: Secondary | ICD-10-CM | POA: Diagnosis present

## 2013-06-06 DIAGNOSIS — I10 Essential (primary) hypertension: Secondary | ICD-10-CM | POA: Diagnosis present

## 2013-06-06 DIAGNOSIS — R748 Abnormal levels of other serum enzymes: Secondary | ICD-10-CM | POA: Diagnosis present

## 2013-06-06 DIAGNOSIS — F411 Generalized anxiety disorder: Secondary | ICD-10-CM | POA: Diagnosis present

## 2013-06-06 DIAGNOSIS — R7989 Other specified abnormal findings of blood chemistry: Secondary | ICD-10-CM | POA: Diagnosis present

## 2013-06-06 DIAGNOSIS — I2510 Atherosclerotic heart disease of native coronary artery without angina pectoris: Secondary | ICD-10-CM | POA: Diagnosis present

## 2013-06-06 DIAGNOSIS — Z79899 Other long term (current) drug therapy: Secondary | ICD-10-CM | POA: Diagnosis not present

## 2013-06-06 LAB — TROPONIN I
TROPONIN I: 1.31 ng/mL — AB (ref <0.30)
Troponin I: 3.73 ng/mL (ref <0.30)
Troponin I: 3.57 ng/mL (ref <0.30)
Troponin I: 2.87 ng/mL (ref <0.30)

## 2013-06-06 LAB — MRSA PCR SCREENING: MRSA by PCR: NEGATIVE

## 2013-06-06 MED ORDER — RIVAROXABAN 20 MG PO TABS: 20.0000 mg | ORAL_TABLET | Freq: Once | ORAL | Status: DC

## 2013-06-06 MED ORDER — FOLIC ACID 1 MG PO TABS
1.0000 mg | ORAL_TABLET | Freq: Every day | ORAL | Status: DC
  Administered 2013-06-06 – 2013-06-08 (×3): 1 mg via ORAL
  Filled 2013-06-06 (×3): qty 1

## 2013-06-06 MED ORDER — DIGOXIN 0.0625 MG HALF TABLET
0.0625 mg | ORAL_TABLET | Freq: Every day | ORAL | Status: DC
  Administered 2013-06-06 – 2013-06-08 (×3): 0.0625 mg via ORAL
  Filled 2013-06-06 (×3): qty 1

## 2013-06-06 MED ORDER — BISACODYL 5 MG PO TBEC: 10.0000 mg | DELAYED_RELEASE_TABLET | Freq: Every day | ORAL | Status: DC | PRN

## 2013-06-06 MED ORDER — CARVEDILOL 3.125 MG PO TABS: 3.1250 mg | ORAL_TABLET | Freq: Two times a day (BID) | ORAL | Status: DC

## 2013-06-06 MED ORDER — POTASSIUM CHLORIDE CRYS ER 20 MEQ PO TBCR
20.0000 meq | EXTENDED_RELEASE_TABLET | Freq: Once | ORAL | Status: AC
  Administered 2013-06-06: 20 meq via ORAL
  Filled 2013-06-06: qty 1

## 2013-06-06 MED ORDER — DEXTROSE 5 % IV SOLN
1.0000 g | Freq: Once | INTRAVENOUS | Status: AC
  Administered 2013-06-06: 1 g via INTRAVENOUS
  Filled 2013-06-06: qty 10

## 2013-06-06 MED ORDER — RIVAROXABAN 20 MG PO TABS
20.0000 mg | ORAL_TABLET | Freq: Every day | ORAL | Status: DC
  Administered 2013-06-06 – 2013-06-07 (×3): 20 mg via ORAL
  Filled 2013-06-06 (×5): qty 1

## 2013-06-06 MED ORDER — CEFTRIAXONE SODIUM 1 G IJ SOLR
1.0000 g | INTRAMUSCULAR | Status: DC
  Administered 2013-06-06 – 2013-06-08 (×2): 1 g via INTRAVENOUS
  Filled 2013-06-06 (×3): qty 10

## 2013-06-06 MED ORDER — VITAMIN B-1 100 MG PO TABS
100.0000 mg | ORAL_TABLET | Freq: Every day | ORAL | Status: DC
  Administered 2013-06-06 – 2013-06-08 (×3): 100 mg via ORAL
  Filled 2013-06-06 (×3): qty 1

## 2013-06-06 MED ORDER — ATORVASTATIN CALCIUM 40 MG PO TABS
40.0000 mg | ORAL_TABLET | Freq: Every day | ORAL | Status: DC
  Administered 2013-06-06 – 2013-06-07 (×2): 40 mg via ORAL
  Filled 2013-06-06 (×3): qty 1

## 2013-06-06 MED ORDER — PROMETHAZINE HCL 25 MG PO TABS: 25.0000 mg | ORAL_TABLET | Freq: Four times a day (QID) | ORAL | Status: DC | PRN

## 2013-06-06 MED ORDER — FUROSEMIDE 20 MG PO TABS
20.0000 mg | ORAL_TABLET | ORAL | Status: DC
  Filled 2013-06-06 (×2): qty 1

## 2013-06-06 MED ORDER — ASPIRIN 81 MG PO CHEW
81.0000 mg | CHEWABLE_TABLET | Freq: Every day | ORAL | Status: DC
  Administered 2013-06-06 – 2013-06-08 (×3): 81 mg via ORAL
  Filled 2013-06-06 (×3): qty 1

## 2013-06-06 MED ORDER — SODIUM CHLORIDE 0.9 % IJ SOLN
3.0000 mL | Freq: Two times a day (BID) | INTRAMUSCULAR | Status: DC
  Administered 2013-06-06 – 2013-06-08 (×6): 3 mL via INTRAVENOUS

## 2013-06-06 MED ORDER — CARVEDILOL 6.25 MG PO TABS
6.2500 mg | ORAL_TABLET | Freq: Two times a day (BID) | ORAL | Status: DC
  Administered 2013-06-06: 6.25 mg via ORAL
  Filled 2013-06-06 (×5): qty 1

## 2013-06-06 MED ORDER — SPIRONOLACTONE 12.5 MG HALF TABLET
12.5000 mg | ORAL_TABLET | Freq: Every day | ORAL | Status: DC
  Administered 2013-06-07: 12.5 mg via ORAL
  Filled 2013-06-06 (×3): qty 1

## 2013-06-06 MED ORDER — LISINOPRIL 5 MG PO TABS
5.0000 mg | ORAL_TABLET | Freq: Every day | ORAL | Status: DC
  Administered 2013-06-06 – 2013-06-07 (×3): 5 mg via ORAL
  Filled 2013-06-06 (×4): qty 1

## 2013-06-06 NOTE — Progress Notes (Signed)
Utilization Review Completed.  

## 2013-06-06 NOTE — ED Notes (Signed)
Attempted report 

## 2013-06-06 NOTE — Consult Note (Deleted)
Reason for Consult: NSTEMI Referring Physician:   [name] is an 58 y.o. female.  HPI:   [name] is a 58 y/o female, with history of ETOH abuse but with no prior cardiac history, who was admitted by [facility] on 05/06/13 for pancreatitis. She had severely reduced systolic function with an EF of 20-25%. She diuresed with IV lasix and transitioned to po lasix. 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 Dr. Ellyn Hack did her cath- Consideration for PCI to the OM2.  She was not placed on bb due to low output and dizziness. She was able to tolerate lisinopril at bedtime and spironolactone 12.5 mg daily. She was discharged to [facility]. Discharge weight was 140 pounds.   She presented yesterday with CP which began around 2000hrs last night.  It was severe, 10/10. Felt like pressure.  It was associated with radiation to neck, back, left arm, diaphoresis, nausea and vomiting.  She was given asa at [facility] and EMS gave her NGT, all of which helped.   Her troponin is up to 3.73.  She has a new LBBB and lateral TWI.  She is currently pain free.  Past Medical History  Diagnosis Date  . Diverticulosis   . Pancreatitis   . Hypertension   . Stroke   . Hyperlipidemia   . Shortness of breath   . Anxiety   . GERD (gastroesophageal reflux disease)   . ETOH abuse     does not drink every day   . CHF (congestive heart failure)     Past Surgical History  Procedure Laterality Date  . Colostomy    . Ileostomy    . Colostomy takedown      Family History  Problem Relation Age of Onset  . Coronary artery disease Father 61    MI    Social History:  reports that she has been smoking Cigarettes.  She has a 40 pack-year smoking history. She has never used smokeless tobacco. She reports that she drinks alcohol. She reports that she does not use illicit drugs.  Allergies:  Allergies  Allergen Reactions  .  Penicillins Rash    Medications:  Scheduled Meds: . aspirin  81 mg Oral Daily  . atorvastatin  40 mg Oral q1800  . carvedilol  6.25 mg Oral BID WC  . [START ON 06/07/2013] cefTRIAXone (ROCEPHIN)  IV  1 g Intravenous Q24H  . digoxin  0.0625 mg Oral Daily  . folic acid  1 mg Oral Daily  . furosemide  20 mg Oral QODAY  . lisinopril  5 mg Oral QHS  . rivaroxaban  20 mg Oral Q supper  . sodium chloride  3 mL Intravenous Q12H  . spironolactone  12.5 mg Oral Daily  . thiamine  100 mg Oral Daily   Continuous Infusions:  PRN Meds:.bisacodyl, promethazine   Results for orders placed during the hospital encounter of 06/05/13 (from the past 48 hour(s))  CBC WITH DIFFERENTIAL     Status: Abnormal   Collection Time    06/05/13 10:08 PM      Result Value Ref Range   WBC 6.8  4.0 - 10.5 K/uL   RBC 4.16  3.87 - 5.11 MIL/uL   Hemoglobin 13.3  12.0 - 15.0 g/dL   HCT 39.3  36.0 - 46.0 %   MCV 94.5  78.0 - 100.0 fL   MCH 32.0  26.0 - 34.0  pg   MCHC 33.8  30.0 - 36.0 g/dL   RDW 12.8  11.5 - 15.5 %   Platelets 215  150 - 400 K/uL   Neutrophils Relative % 64  43 - 77 %   Neutro Abs 4.4  1.7 - 7.7 K/uL   Lymphocytes Relative 16  12 - 46 %   Lymphs Abs 1.1  0.7 - 4.0 K/uL   Monocytes Relative 8  3 - 12 %   Monocytes Absolute 0.6  0.1 - 1.0 K/uL   Eosinophils Relative 11 (*) 0 - 5 %   Eosinophils Absolute 0.7  0.0 - 0.7 K/uL   Basophils Relative 1  0 - 1 %   Basophils Absolute 0.0  0.0 - 0.1 K/uL  BASIC METABOLIC PANEL     Status: Abnormal   Collection Time    06/05/13 10:08 PM      Result Value Ref Range   Sodium 136 (*) 137 - 147 mEq/L   Potassium 3.6 (*) 3.7 - 5.3 mEq/L   Chloride 100  96 - 112 mEq/L   CO2 21  19 - 32 mEq/L   Glucose, Bld 123 (*) 70 - 99 mg/dL   BUN 11  6 - 23 mg/dL   Creatinine, Ser 1.02  0.50 - 1.10 mg/dL   Calcium 9.2  8.4 - 10.5 mg/dL   GFR calc non Af Amer 59 (*) >90 mL/min   GFR calc Af Amer 69 (*) >90 mL/min   Comment: (NOTE)     The eGFR has been  calculated using the CKD EPI equation.     This calculation has not been validated in all clinical situations.     eGFR's persistently <90 mL/min signify possible Chronic Kidney     Disease.  DIGOXIN LEVEL     Status: Abnormal   Collection Time    06/05/13 10:08 PM      Result Value Ref Range   Digoxin Level 0.4 (*) 0.8 - 2.0 ng/mL  PROTIME-INR     Status: None   Collection Time    06/05/13 10:08 PM      Result Value Ref Range   Prothrombin Time 11.6  11.6 - 15.2 seconds   INR 0.86  0.00 - 1.49  TROPONIN I     Status: None   Collection Time    06/05/13 10:08 PM      Result Value Ref Range   Troponin I <0.30  <0.30 ng/mL   Comment:            Due to the release kinetics of cTnI,     a negative result within the first hours     of the onset of symptoms does not rule out     myocardial infarction with certainty.     If myocardial infarction is still suspected,     repeat the test at appropriate intervals.  URINALYSIS, ROUTINE W REFLEX MICROSCOPIC     Status: Abnormal   Collection Time    06/05/13 11:21 PM      Result Value Ref Range   Color, Urine YELLOW  YELLOW   APPearance CLOUDY (*) CLEAR   Specific Gravity, Urine 1.016  1.005 - 1.030   pH 5.0  5.0 - 8.0   Glucose, UA NEGATIVE  NEGATIVE mg/dL   Hgb urine dipstick TRACE (*) NEGATIVE   Bilirubin Urine NEGATIVE  NEGATIVE   Ketones, ur NEGATIVE  NEGATIVE mg/dL   Protein, ur NEGATIVE  NEGATIVE mg/dL   Urobilinogen, UA  0.2  0.0 - 1.0 mg/dL   Nitrite NEGATIVE  NEGATIVE   Leukocytes, UA LARGE (*) NEGATIVE  URINE MICROSCOPIC-ADD ON     Status: Abnormal   Collection Time    06/05/13 11:21 PM      Result Value Ref Range   Squamous Epithelial / LPF FEW (*) RARE   WBC, UA TOO NUMEROUS TO COUNT  <3 WBC/hpf   RBC / HPF 0-2  <3 RBC/hpf   Bacteria, UA MANY (*) RARE   Casts HYALINE CASTS (*) NEGATIVE  TROPONIN I     Status: Abnormal   Collection Time    06/06/13 12:28 AM      Result Value Ref Range   Troponin I 1.31 (*) <0.30  ng/mL   Comment:            Due to the release kinetics of cTnI,     a negative result within the first hours     of the onset of symptoms does not rule out     myocardial infarction with certainty.     If myocardial infarction is still suspected,     repeat the test at appropriate intervals.     CRITICAL RESULT CALLED TO, READ BACK BY AND VERIFIED WITH:     FUTRELL M,RN 06/06/13 0111 WAYK  MRSA PCR SCREENING     Status: None   Collection Time    06/06/13  2:11 AM      Result Value Ref Range   MRSA by PCR NEGATIVE  NEGATIVE   Comment:            The GeneXpert MRSA Assay (FDA     approved for NASAL specimens     only), is one component of a     comprehensive MRSA colonization     surveillance program. It is not     intended to diagnose MRSA     infection nor to guide or     monitor treatment for     MRSA infections.  TROPONIN I     Status: Abnormal   Collection Time    06/06/13  8:00 AM      Result Value Ref Range   Troponin I 3.73 (*) <0.30 ng/mL   Comment:            Due to the release kinetics of cTnI,     a negative result within the first hours     of the onset of symptoms does not rule out     myocardial infarction with certainty.     If myocardial infarction is still suspected,     repeat the test at appropriate intervals.    Dg Chest Port 1 View  06/05/2013   CLINICAL DATA:  Shortness of breath.  EXAM: PORTABLE CHEST - 1 VIEW  COMPARISON:  05/10/2013.  FINDINGS: Normal sized heart. Clear lungs with normal vascularity. Normal appearing bones  IMPRESSION: Normal examination.   Electronically Signed   By: Enrique Sack M.D.   On: 06/05/2013 23:18    Review of Systems  Constitutional: Positive for diaphoresis. Negative for fever.  HENT: Positive for sore throat. Negative for congestion.   Respiratory: Positive for shortness of breath (a little). Negative for cough.   Cardiovascular: Positive for chest pain and palpitations. Negative for orthopnea, leg swelling and PND.   Gastrointestinal: Positive for nausea and vomiting. Negative for abdominal pain, blood in stool and melena.  Genitourinary: Negative for dysuria and hematuria.  Musculoskeletal: Positive for neck pain.  Neurological: Positive for dizziness and headaches (Started yesterday morning.).   Blood pressure 84/50, pulse 74, temperature 98 F (36.7 C), temperature source Oral, resp. rate 18, height _0  (1.6 m), weight 131 lb 9.8 oz (59.7 kg), SpO2 97.00%. Physical Exam  Nursing note and vitals reviewed. Constitutional: She is oriented to person, place, and time. She appears well-developed and well-nourished. No distress.  HENT:  Head: Normocephalic and atraumatic.  Mouth/Throat: Oropharynx is clear and moist. No oropharyngeal exudate.  Eyes: EOM are normal. Pupils are equal, round, and reactive to light. No scleral icterus.  Neck: Normal range of motion. Neck supple.  Cardiovascular: Normal rate, regular rhythm, S1 normal and S2 normal.   Pulses:      Radial pulses are 2+ on the right side, and 2+ on the left side.       Dorsalis pedis pulses are 2+ on the right side, and 2+ on the left side.  No MM  Respiratory: Effort normal and breath sounds normal. She has no wheezes. She has no rales.  GI: Soft. Bowel sounds are normal. She exhibits no distension. There is no tenderness.  Musculoskeletal: She exhibits no edema.  Lymphadenopathy:    She has no cervical adenopathy.  Neurological: She is alert and oriented to person, place, and time. Cranial nerve deficit: Mildly weak in the left upper extremity .  Residual from prior stroke. She exhibits abnormal muscle tone.  Skin: Skin is warm and dry.  Psychiatric: She has a normal mood and affect.    Assessment/Plan:   NSTEMI Troponin up to 3.73.  Continue to cycle.  DC Xarelto and start Heparin.  Coreg, lipitor, asa.  Recheck echo.  Needs LHC.     LBBB  New since May 06, 2013    Atrial fibrillation with RVR  Maintaining Sinus rhythm.  On  coreg.    Hypotension Holding diuretics today and will hold Coreg if she continues to be hypotensive.    Chronic systolic CHF (congestive heart failure) She appears euvolemic.  No orthopnea or edema.    UTI (lower urinary tract infection)  Per primary   Tarri Fuller, PA-C  06/06/2013, 11:06 AM

## 2013-06-06 NOTE — Progress Notes (Signed)
Pt had a Troponin of 1.31, no S/S, MD notified and ordered ECG AM, will continue to monitor, Thanks, Arvella Nigh

## 2013-06-06 NOTE — Progress Notes (Signed)
ANTICOAGULATION CONSULT NOTE - Initial Consult  Pharmacy Consult for Xarelto Indication: nonvalvular afib  Allergies  Allergen Reactions  . Penicillins Rash    Patient Measurements: Height: 5\' 3"  (160 cm) Weight: 132 lb (59.875 kg) IBW/kg (Calculated) : 52.4  Vital Signs: Temp: 98 F (36.7 C) (05/16 2145) Temp src: Oral (05/16 2145) BP: 176/63 mmHg (05/17 0007) Pulse Rate: 77 (05/17 0007)  Labs:  Recent Labs  06/05/13 2208  HGB 13.3  HCT 39.3  PLT 215  LABPROT 11.6  INR 0.86  CREATININE 1.02  TROPONINI <0.30    Estimated Creatinine Clearance: 49.7 ml/min (by C-G formula based on Cr of 1.02).   Medical History: Past Medical History  Diagnosis Date  . Diverticulosis   . Pancreatitis   . Hypertension   . Stroke   . Hyperlipidemia   . Shortness of breath   . Anxiety   . GERD (gastroesophageal reflux disease)   . ETOH abuse     does not drink every day   . CHF (congestive heart failure)     Medications:  See electronic med rec  Assessment: 58 y.o. female presents from NH with afib with RVR. Noted pt with CHADS-Vasc score of 6. To begin Xarelto for nonvalvular afib. CBC stable at baseline. CrCl ~82ml/min - right on borderline between 20mg  and 15mg  dose.  Goal of Therapy:  Prevention of stroke Monitor platelets by anticoagulation protocol: Yes   Plan:  1. Xarelto 20mg  po daily with supper. First dose now. 2. Will f/u renal function closely 3. Watch for s/s bleeding  Sherlon Handing, PharmD, BCPS Clinical pharmacist, pager [phone] 06/06/2013,12:19 AM

## 2013-06-06 NOTE — Consult Note (Signed)
CARDIOLOGY CONSULT NOTE  Patient ID: [name] MRN: [id number] DOB/AGE: March 17, 1955 58 y.o.  Admit date: 06/05/2013 Primary Physician  Primary Cardiologist  Dr. Aundra Dubin  Chief Complaint  Atrial fib/chest pain  HPI:  The patient has a history of cardiomyopathy.  EF has been 20 - 25%.  She does also have CAD as described below.  She also has a history of ETOH abuse which might contribute to her global hypokinesis.   She has been followed in the [facility] and living in a nursing home.    We followed her last in the hospital last month when she was admitted with acute pancreatitis.  The cardiomyopathy and CAD were newly diagnosed at that time.  Of note she did not tolerate beta blocker at that time because of low output.  AS an outpatient ACE was not titrated secondary to dizziness.   She was admitted early this AM from the nursing home with sudden SOB.  She was in atrial fib with RVR.  She was treated with Cardizem IV bolus  (by EMS IV) and did convert to NSR.   This appears to be a new diagnosis of atrial fib.    Troponin is mildly elevated.   She reports that her symptoms started suddenly.  She developed chest pain with a rapid heart rate.  She had pain substernal and radiating into her jaw.  She felt nauseated with SOB.  She had presyncope and cold and clammy.     Past Medical History  Diagnosis Date  . Diverticulosis   . Pancreatitis   . Hypertension   . Stroke   . Hyperlipidemia   . Shortness of breath   . Anxiety   . GERD (gastroesophageal reflux disease)   . ETOH abuse     does not drink every day   . CHF (congestive heart failure)     Past Surgical History  Procedure Laterality Date  . Colostomy    . Ileostomy    . Colostomy takedown      Allergies  Allergen Reactions  . Penicillins Rash   Prescriptions prior to admission  Medication Sig Dispense Refill  . aspirin 81 MG chewable tablet Chew 1 tablet (81 mg total) by mouth daily.      Marland Kitchen  atorvastatin (LIPITOR) 40 MG tablet Take 1 tablet (40 mg total) by mouth daily at 6 PM.      . bisacodyl (DULCOLAX) 5 MG EC tablet Take 2 tablets (10 mg total) by mouth daily as needed for moderate constipation.  30 tablet  0  . digoxin (LANOXIN) 0.125 MG tablet Take 0.0625 mg by mouth daily.      . folic acid (FOLVITE) 1 MG tablet Take 1 tablet (1 mg total) by mouth daily.      . furosemide (LASIX) 20 MG tablet Take 1 tablet (20 mg total) by mouth every other day.  30 tablet    . levalbuterol (XOPENEX) 0.63 MG/3ML nebulizer solution Take 3 mLs (0.63 mg total) by nebulization every 6 (six) hours as needed for wheezing or shortness of breath.  3 mL  12  . lisinopril (PRINIVIL,ZESTRIL) 5 MG tablet Take 1 tablet (5 mg total) by mouth at bedtime.      . promethazine (PHENERGAN) 25 MG tablet Take 25 mg by mouth every 6 (six) hours as needed for nausea or vomiting.      Marland Kitchen spironolactone (ALDACTONE) 12.5 mg TABS tablet Take 0.5 tablets (12.5 mg total) by mouth daily.      Marland Kitchen  thiamine 100 MG tablet Take 1 tablet (100 mg total) by mouth daily.      . carvedilol (COREG) 3.125 MG tablet Take 1 tablet (3.125 mg total) by mouth 2 (two) times daily with a meal.  (She was not taking this)  60 tablet  6   Family History  Problem Relation Age of Onset  . Coronary artery disease Father 85    MI    History   Social History  . Marital Status: Married    Spouse Name: N/A    Number of Children: N/A  . Years of Education: N/A   Occupational History  . Not on file.   Social History Main Topics  . Smoking status: Current Every Day Smoker -- 1.00 packs/day for 40 years    Types: Cigarettes  . Smokeless tobacco: Never Used  . Alcohol Use: Yes     Comment: 2 3 TIMES A WEEK  . Drug Use: No  . Sexual Activity: Not on file   Other Topics Concern  . Not on file   Social History Narrative  . No narrative on file     ROS:  Difficulty with balance but no falls.   As stated in the HPI and negative for all  other systems.  Physical Exam: Blood pressure 84/50, pulse 74, temperature 98 F (36.7 C), temperature source Oral, resp. rate 18, height 5\' 3"  (1.6 m), weight 131 lb 9.8 oz (59.7 kg), SpO2 97.00%.  GENERAL:  Well appearing HEENT:  Pupils equal round and reactive, fundi not visualized, oral mucosa unremarkable, poor dentition.  NECK:  No jugular venous distention, waveform within normal limits, carotid upstroke brisk and symmetric, no bruits, no thyromegaly LYMPHATICS:  No cervical, inguinal adenopathy LUNGS:  Clear to auscultation bilaterally BACK:  No CVA tenderness CHEST:  Unremarkable HEART:  PMI not displaced or sustained,S1 and S2 within normal limits, no S3, no S4, no clicks, no rubs, no murmurs ABD:  Flat, positive bowel sounds normal in frequency in pitch, no bruits, no rebound, no guarding, no midline pulsatile mass, no hepatomegaly, no splenomegaly EXT:  2 plus pulses throughout, no edema, no cyanosis no clubbing SKIN:  No rashes no nodules NEURO:  Cranial nerves II through XII grossly intact, motor grossly intact throughout PSYCH:  Cognitively intact, oriented to person place and time   Labs: Lab Results  Component Value Date   BUN 11 06/05/2013   Lab Results  Component Value Date   CREATININE 1.02 06/05/2013   Lab Results  Component Value Date   NA 136* 06/05/2013   K 3.6* 06/05/2013   CL 100 06/05/2013   CO2 21 06/05/2013   Lab Results  Component Value Date   TROPONINI 3.73* 06/06/2013   Lab Results  Component Value Date   WBC 6.8 06/05/2013   HGB 13.3 06/05/2013   HCT 39.3 06/05/2013   MCV 94.5 06/05/2013   PLT 215 06/05/2013     Radiology:   CXR: Normal sized heart. Clear lungs with normal vascularity. Normal appearing bones   EKG:NSR, rate 79. LBBB.  06/06/2013  ASSESSMENT AND PLAN:   ATRIAL FIB:   This will be difficult to treat as she does not tolerate beta blocker.  I agree with NOAC.  She is not drinking and has had no falls. Her BP today will not allow  addition of beta blocker.  We will need to consider rhythm control with possible amiodarone.    CARDIOMYOPATHY:   She seems to be euvolemic.  At this  point, no change in therapy is indicated other than the addition of beta blocker if we can.  Unfortunately they have not been able to add beta blocker in the [facility].    ETOH:  She reports that she is not drinking.  CAD:  Elevated troponin.  I am unable to view the films.  However, I did read the report.  Elevated troponin is not unexpected in this situation.  Continue with medical management.  NO further imaging indicated.   ABNORMAL UA:  Per primary team.   Signed: Minus Breeding 06/06/2013, 11:59 AM

## 2013-06-06 NOTE — Plan of Care (Signed)
Problem: Phase I Progression Outcomes Goal: Heart rate or rhythm control medication Outcome: Completed/Met Date Met:  06/06/13 Pt received med in ambulance before arriving to the hospital

## 2013-06-06 NOTE — H&P (Signed)
[facility] History and Physical  [name] ZOX:[id number] DOB: [birth year] DOA: 06/05/2013  Referring physician: EDP PCP: No PCP Per Patient   Chief Complaint: A.Fib   HPI: [name] is a 58 y.o. female who presents to the ED with sudden onset of SOB and chest pain.  This occurred at her NH.  Patient was found to be in A.Fib with RVR.  She was given cardizem at the NH and spontaneously converted out of A.Fib back to NSR.  She began to feel better and the chest pain resolved.  Patient has extensive history of CAD and ischemic cardiomyopathy with EF 20-25%.  She just had a heart cath less than a month ago.  Review of Systems: Systems reviewed.  As above, otherwise negative  Past Medical History  Diagnosis Date  . Diverticulosis   . Pancreatitis   . Hypertension   . Stroke   . Hyperlipidemia   . Shortness of breath   . Anxiety   . GERD (gastroesophageal reflux disease)   . ETOH abuse     does not drink every day   . CHF (congestive heart failure)    Past Surgical History  Procedure Laterality Date  . Colostomy    . Ileostomy    . Colostomy takedown     Social History:  reports that she has been smoking Cigarettes.  She has a 40 pack-year smoking history. She has never used smokeless tobacco. She reports that she drinks alcohol. She reports that she does not use illicit drugs.  Allergies  Allergen Reactions  . Penicillins Rash    Family History  Problem Relation Age of Onset  . Coronary artery disease Father 64    MI     Prior to Admission medications   Medication Sig Start Date End Date Taking? Authorizing Provider  aspirin 81 MG chewable tablet Chew 1 tablet (81 mg total) by mouth daily. 05/14/13  Yes Geradine Girt, DO  atorvastatin (LIPITOR) 40 MG tablet Take 1 tablet (40 mg total) by mouth daily at 6 PM. 05/14/13  Yes Geradine Girt, DO  bisacodyl (DULCOLAX) 5 MG EC tablet Take 2 tablets (10 mg total) by mouth daily as needed for moderate constipation.  05/14/13  Yes Geradine Girt, DO  digoxin (LANOXIN) 0.125 MG tablet Take 0.0625 mg by mouth daily.   Yes Historical Provider, MD  folic acid (FOLVITE) 1 MG tablet Take 1 tablet (1 mg total) by mouth daily. 05/14/13  Yes Geradine Girt, DO  furosemide (LASIX) 20 MG tablet Take 1 tablet (20 mg total) by mouth every other day. 05/15/13  Yes Geradine Girt, DO  levalbuterol (XOPENEX) 0.63 MG/3ML nebulizer solution Take 3 mLs (0.63 mg total) by nebulization every 6 (six) hours as needed for wheezing or shortness of breath. 05/14/13  Yes Geradine Girt, DO  lisinopril (PRINIVIL,ZESTRIL) 5 MG tablet Take 1 tablet (5 mg total) by mouth at bedtime. 05/14/13  Yes Geradine Girt, DO  promethazine (PHENERGAN) 25 MG tablet Take 25 mg by mouth every 6 (six) hours as needed for nausea or vomiting.   Yes Historical Provider, MD  spironolactone (ALDACTONE) 12.5 mg TABS tablet Take 0.5 tablets (12.5 mg total) by mouth daily. 05/14/13  Yes Geradine Girt, DO  thiamine 100 MG tablet Take 1 tablet (100 mg total) by mouth daily. 05/14/13  Yes Geradine Girt, DO  carvedilol (COREG) 3.125 MG tablet Take 1 tablet (3.125 mg total) by mouth 2 (two) times daily with a  meal. 06/02/13   Amy D Ninfa Meeker, NP   Physical Exam: Filed Vitals:   06/06/13 0007  BP: 176/63  Pulse: 77  Temp:   Resp:     BP 176/63  Pulse 77  Temp(Src) 98 F (36.7 C) (Oral)  Resp 20  Ht 5\' 3"  (1.6 m)  Wt 59.875 kg (132 lb)  BMI 23.39 kg/m2  SpO2 100%  General Appearance:    Alert, oriented, no distress, appears stated age  Head:    Normocephalic, atraumatic  Eyes:    PERRL, EOMI, sclera non-icteric        Nose:   Nares without drainage or epistaxis. Mucosa, turbinates normal  Throat:   Moist mucous membranes. Oropharynx without erythema or exudate.  Neck:   Supple. No carotid bruits.  No thyromegaly.  No lymphadenopathy.   Back:     No CVA tenderness, no spinal tenderness  Lungs:     Clear to auscultation bilaterally, without wheezes, rhonchi or  rales  Chest wall:    No tenderness to palpitation  Heart:    Regular rate and rhythm without murmurs, gallops, rubs  Abdomen:     Soft, non-tender, nondistended, normal bowel sounds, no organomegaly  Genitalia:    deferred  Rectal:    deferred  Extremities:   No clubbing, cyanosis or edema.  Pulses:   2+ and symmetric all extremities  Skin:   Skin color, texture, turgor normal, no rashes or lesions  Lymph nodes:   Cervical, supraclavicular, and axillary nodes normal  Neurologic:   CNII-XII intact. Normal strength, sensation and reflexes      throughout    Labs on Admission:  Basic Metabolic Panel:  Recent Labs Lab 06/02/13 1119 06/05/13 2208  NA 141 136*  K 3.9 3.6*  CL 101 100  CO2 27 21  GLUCOSE 85 123*  BUN 9 11  CREATININE 1.00 1.02  CALCIUM 9.9 9.2   Liver Function Tests: No results found for this basename: AST, ALT, ALKPHOS, BILITOT, PROT, ALBUMIN,  in the last 168 hours No results found for this basename: LIPASE, AMYLASE,  in the last 168 hours No results found for this basename: AMMONIA,  in the last 168 hours CBC:  Recent Labs Lab 06/05/13 2208  WBC 6.8  NEUTROABS 4.4  HGB 13.3  HCT 39.3  MCV 94.5  PLT 215   Cardiac Enzymes:  Recent Labs Lab 06/05/13 2208  TROPONINI <0.30    BNP (last 3 results)  Recent Labs  05/09/13 0340  PROBNP 8584.0*   CBG: No results found for this basename: GLUCAP,  in the last 168 hours  Radiological Exams on Admission: Dg Chest Port 1 View  06/05/2013   CLINICAL DATA:  Shortness of breath.  EXAM: PORTABLE CHEST - 1 VIEW  COMPARISON:  05/10/2013.  FINDINGS: Normal sized heart. Clear lungs with normal vascularity. Normal appearing bones  IMPRESSION: Normal examination.   Electronically Signed   By: Enrique Sack M.D.   On: 06/05/2013 23:18    EKG: Independently reviewed.  Assessment/Plan Principal Problem:   Atrial fibrillation with RVR Active Problems:   Chronic systolic CHF (congestive heart  failure)   1. A.Fib RVR - thankfully patient back in NSR.  No surprise that this caused chest pain / demand ischemia in this patient with severe CAD / ischemic cardiomyopathy.  Increasing dose of coreg to 6.25 BID.  Admitting patient for observation.  will put patient on xarelto (also for A.Fib), needs CW to help get this authorized at home.  2. UTI - treating with rocephin IV, other than the A.Fib episode, no evidence of systemic involvement at this point. 3. Chronic systolic CHF - most recent echo showed EF 20-25%,    Code Status: Full Code  Family Communication: No family in room Disposition Plan: Admit to inpatient   Time spent: 34 min  [facility] Pager [phone]  If 7AM-7PM, please contact the day team taking care of the patient [email or web address] Password [facility] 06/06/2013, 12:15 AM

## 2013-06-06 NOTE — ED Notes (Signed)
Transporting patient to new room assignment. 

## 2013-06-06 NOTE — Progress Notes (Signed)
[facility] PROGRESS NOTE  [name] MBE:[id number] DOB: 02-Dec-1955 DOA: 06/05/2013 PCP: No PCP Per Patient I have seen and examined pt who is a 58yo admitted this am by Dr Alcario Drought with chest pain, PAF diagnosed at SNF>> given cardizem at the facility and she spontaneously converted to NSR. Also has h/o HTN, CVA, HLD, CHF. Troponins cycled last pm elevate-1.3, 3.7. She is CP free at this time, I have consulted cards for further eval/recs, will follow.     [facility] Pager [phone]. If 7PM-7AM, please contact night-coverage at [email or web address], password [facility] 06/06/2013, 10:39 AM  LOS: 1 day

## 2013-06-07 DIAGNOSIS — R799 Abnormal finding of blood chemistry, unspecified: Secondary | ICD-10-CM

## 2013-06-07 DIAGNOSIS — E440 Moderate protein-calorie malnutrition: Secondary | ICD-10-CM | POA: Insufficient documentation

## 2013-06-07 LAB — URINE CULTURE: Colony Count: 4000

## 2013-06-07 LAB — COMPREHENSIVE METABOLIC PANEL
ALK PHOS: 98 U/L (ref 39–117)
ALT: 20 U/L (ref 0–35)
AST: 26 U/L (ref 0–37)
Albumin: 3.3 g/dL — ABNORMAL LOW (ref 3.5–5.2)
BILIRUBIN TOTAL: 0.3 mg/dL (ref 0.3–1.2)
BUN: 9 mg/dL (ref 6–23)
CHLORIDE: 100 meq/L (ref 96–112)
CO2: 24 mEq/L (ref 19–32)
Calcium: 9.3 mg/dL (ref 8.4–10.5)
Creatinine, Ser: 1.03 mg/dL (ref 0.50–1.10)
GFR, EST AFRICAN AMERICAN: 68 mL/min — AB (ref >90)
GFR, EST NON AFRICAN AMERICAN: 59 mL/min — AB (ref >90)
GLUCOSE: 122 mg/dL — AB (ref 70–99)
POTASSIUM: 4.1 meq/L (ref 3.7–5.3)
Sodium: 138 mEq/L (ref 137–147)
TOTAL PROTEIN: 7.4 g/dL (ref 6.0–8.3)

## 2013-06-07 LAB — TSH: TSH: 3.52 u[IU]/mL (ref 0.350–4.500)

## 2013-06-07 LAB — CBC
HCT: 40.7 % (ref 36.0–46.0)
Hemoglobin: 13.5 g/dL (ref 12.0–15.0)
MCH: 31.8 pg (ref 26.0–34.0)
MCHC: 33.2 g/dL (ref 30.0–36.0)
MCV: 96 fL (ref 78.0–100.0)
Platelets: 232 10*3/uL (ref 150–400)
RBC: 4.24 MIL/uL (ref 3.87–5.11)
RDW: 13 % (ref 11.5–15.5)
WBC: 7 10*3/uL (ref 4.0–10.5)

## 2013-06-07 LAB — TROPONIN I
Troponin I: 2.08 ng/mL (ref <0.30)
Troponin I: 2.13 ng/mL (ref <0.30)

## 2013-06-07 LAB — MAGNESIUM: MAGNESIUM: 1.9 mg/dL (ref 1.5–2.5)

## 2013-06-07 MED ORDER — ADULT MULTIVITAMIN W/MINERALS CH
1.0000 | ORAL_TABLET | Freq: Every day | ORAL | Status: DC
  Administered 2013-06-07 – 2013-06-08 (×2): 1 via ORAL
  Filled 2013-06-07 (×2): qty 1

## 2013-06-07 MED ORDER — AMIODARONE HCL 200 MG PO TABS
400.0000 mg | ORAL_TABLET | Freq: Two times a day (BID) | ORAL | Status: DC
  Administered 2013-06-07 – 2013-06-08 (×3): 400 mg via ORAL
  Filled 2013-06-07 (×4): qty 2

## 2013-06-07 MED ORDER — CARVEDILOL 3.125 MG PO TABS
3.1250 mg | ORAL_TABLET | Freq: Two times a day (BID) | ORAL | Status: DC
  Administered 2013-06-08: 3.125 mg via ORAL
  Filled 2013-06-07 (×5): qty 1

## 2013-06-07 MED ORDER — ENSURE COMPLETE PO LIQD
237.0000 mL | Freq: Two times a day (BID) | ORAL | Status: DC
  Administered 2013-06-07 – 2013-06-08 (×2): 237 mL via ORAL

## 2013-06-07 NOTE — Progress Notes (Signed)
INITIAL NUTRITION ASSESSMENT  DOCUMENTATION CODES Per approved criteria  -Non-severe (moderate) malnutrition in the context of chronic illness  Pt meets criteria for MODERATE MALNUTRITION in the context of CHRONIC ILLNESS as evidenced by 6% weight loss in one month, moderate muscle wasting, and estimated energy intake <75% of estimated energy needs for >/= 1 month.  INTERVENTION: Provide Ensure Complete BID Encourage adequate po intake Provide Multivitamin with minerals daily  NUTRITION DIAGNOSIS: Inadequate oral intake related to poor appetite as evidenced by pt's report of eating 50% less than usual for past month and 6% weight loss.   Goal: Pt to meet >/= 90% of their estimated nutrition needs   Monitor:  PO intake, weight trend, labs, I/O's  Reason for Assessment: Malnutrition Screening Tool, score of 4  58 y.o. female  Admitting Dx: Atrial fibrillation with RVR  ASSESSMENT: 58 y.o. female who presents to the ED with sudden onset of SOB and chest pain. This occurred at her NH. Patient was found to be in A.Fib with RVR. She was given cardizem at the NH and spontaneously converted out of A.Fib back to NSR. She began to feel better and the chest pain resolved.  Pt states that prior to her hospitalization one month ago she was weighing 160 lbs. She reports losing down to 140 lbs due to fluid losses. Pt's weight on 05/07/13 was 140 lbs per weight history. Pt states her appetite has been poor/varied for the past month and she has lost additional weight. She reports eating 50% of breakfast this morning and states that is how much she has been eating for the past month. Pt also reports that she used to drink a significant amount of liquor but, she stopped after being diagnosed with heart problems.  Per nursing notes pt is eating 50-100% of meals.   Nutrition Focused Physical Exam:  Subcutaneous Fat:  Orbital Region: mild wasting Upper Arm Region: wnl Thoracic and Lumbar Region:  NA  Muscle:  Temple Region: mild wasting Clavicle Bone Region: mild wasting Clavicle and Acromion Bone Region: wnl Scapular Bone Region: wnl Dorsal Hand: wnl Patellar Region: moderate wasting Anterior Thigh Region: moderate wasting Posterior Calf Region: moderate wasting  Edema: intact   Height: Ht Readings from Last 1 Encounters:  06/06/13 5\' 3"  (1.6 m)    Weight: Wt Readings from Last 1 Encounters:  06/07/13 132 lb (59.875 kg)    Ideal Body Weight: 115 lbs  % Ideal Body Weight: 115%  Wt Readings from Last 10 Encounters:  06/07/13 132 lb (59.875 kg)  06/02/13 133 lb 4 oz (60.442 kg)  05/20/13 134 lb 4 oz (60.895 kg)  05/15/13 138 lb 14.2 oz (63 kg)  05/15/13 138 lb 14.2 oz (63 kg)    Usual Body Weight: 160 lbs  % Usual Body Weight: 83%  BMI:  Body mass index is 23.39 kg/(m^2).  Estimated Nutritional Needs: Kcal: 1500-1700 Protein: 65-75 grams Fluid: 1.6-1.8 L/day  Skin: intact  Diet Order: General  EDUCATION NEEDS: -No education needs identified at this time   Intake/Output Summary (Last 24 hours) at 06/07/13 1327 Last data filed at 06/07/13 0902  Gross per 24 hour  Intake    893 ml  Output    345 ml  Net    548 ml    Last BM: 5/18   Labs:   Recent Labs Lab 06/02/13 1119 06/05/13 2208 06/07/13 0844  NA 141 136* 138  K 3.9 3.6* 4.1  CL 101 100 100  CO2 27 21 24  BUN 9 11 9   CREATININE 1.00 1.02 1.03  CALCIUM 9.9 9.2 9.3  MG  --   --  1.9  GLUCOSE 85 123* 122*    CBG (last 3)  No results found for this basename: GLUCAP,  in the last 72 hours  Scheduled Meds: . amiodarone  400 mg Oral BID  . aspirin  81 mg Oral Daily  . atorvastatin  40 mg Oral q1800  . carvedilol  3.125 mg Oral BID WC  . cefTRIAXone (ROCEPHIN)  IV  1 g Intravenous Q24H  . digoxin  0.0625 mg Oral Daily  . folic acid  1 mg Oral Daily  . furosemide  20 mg Oral QODAY  . lisinopril  5 mg Oral QHS  . rivaroxaban  20 mg Oral Q supper  . sodium chloride  3 mL  Intravenous Q12H  . spironolactone  12.5 mg Oral Daily  . thiamine  100 mg Oral Daily    Continuous Infusions:   Past Medical History  Diagnosis Date  . Diverticulosis   . Pancreatitis   . Hypertension   . Stroke 2011    Mild left weakness  . Hyperlipidemia   . Anxiety   . GERD (gastroesophageal reflux disease)   . ETOH abuse   . CHF (congestive heart failure)     EF 25%    Past Surgical History  Procedure Laterality Date  . Colostomy    . Ileostomy    . Colostomy takedown      Pryor Ochoa RD, LDN Inpatient Clinical Dietitian Pager: [phone] After Hours Pager: [phone]

## 2013-06-07 NOTE — Progress Notes (Addendum)
[facility] PROGRESS NOTE  [name] PFX:[id number] DOB: [birth year] DOA: 06/05/2013 PCP: No PCP Per Patient  Assessment/Plan: 1.AFIB with RVR -converted to NSR after cardizem was given prior to admit, and coreg was increased to 6.25 on admit, and continued on digoxin -appreciate cards assistance>> amiodarone added and coreg decreased back to 3.125 -continue xarelto 2.Chronic systolic CHF - most recent echo showed EF 20-25% -continue lasix, spironolactone, coreg, amio added as above 3.UTI - urine cx with insignificant growth, will complete 3days of rocephin  4.Moderate malnutrition -continue nutritional supplements and follow 5.h/o Alcohol abuse , -sober since she has been at SNF    Code Status: full Family Communication: None at bedside Disposition Plan: to SNF when medically ready   Consultants:  cards  Procedures:  none  Antibiotics:  Rocephin started on 5/17  HPI/Subjective: Pt denies cp, no SOB  Objective: Filed Vitals:   06/07/13 1033  BP:   Pulse: 99  Temp:   Resp:     Intake/Output Summary (Last 24 hours) at 06/07/13 1034 Last data filed at 06/07/13 0902  Gross per 24 hour  Intake    893 ml  Output    345 ml  Net    548 ml   Filed Weights   06/05/13 2145 06/06/13 0127 06/07/13 0533  Weight: 59.875 kg (132 lb) 59.7 kg (131 lb 9.8 oz) 59.875 kg (132 lb)    Exam:  General: alert & oriented x 3 In NAD Cardiovascular: RRR, nl S1 s2 Respiratory: CTAB Abdomen: soft +BS NT/ND, no masses palpable Extremities: No cyanosis and no edema     Data Reviewed: Basic Metabolic Panel:  Recent Labs Lab 06/02/13 1119 06/05/13 2208 06/07/13 0844  NA 141 136* 138  K 3.9 3.6* 4.1  CL 101 100 100  CO2 27 21 24   GLUCOSE 85 123* 122*  BUN 9 11 9   CREATININE 1.00 1.02 1.03  CALCIUM 9.9 9.2 9.3  MG  --   --  1.9   Liver Function Tests:  Recent Labs Lab 06/07/13 0844  AST 26  ALT 20  ALKPHOS 98  BILITOT 0.3  PROT 7.4  ALBUMIN 3.3*    No results found for this basename: LIPASE, AMYLASE,  in the last 168 hours No results found for this basename: AMMONIA,  in the last 168 hours CBC:  Recent Labs Lab 06/05/13 2208 06/07/13 0804  WBC 6.8 7.0  NEUTROABS 4.4  --   HGB 13.3 13.5  HCT 39.3 40.7  MCV 94.5 96.0  PLT 215 232   Cardiac Enzymes:  Recent Labs Lab 06/06/13 0800 06/06/13 1210 06/06/13 1802 06/07/13 0250 06/07/13 0804  TROPONINI 3.73* 3.57* 2.87* 2.08* 2.13*   BNP (last 3 results)  Recent Labs  05/09/13 0340  PROBNP 8584.0*   CBG: No results found for this basename: GLUCAP,  in the last 168 hours  Recent Results (from the past 240 hour(s))  MRSA PCR SCREENING     Status: None   Collection Time    06/06/13  2:11 AM      Result Value Ref Range Status   MRSA by PCR NEGATIVE  NEGATIVE Final   Comment:            The GeneXpert MRSA Assay (FDA     approved for NASAL specimens     only), is one component of a     comprehensive MRSA colonization     surveillance program. It is not     intended to diagnose MRSA  infection nor to guide or     monitor treatment for     MRSA infections.     Studies: Dg Chest Port 1 View  06/05/2013   CLINICAL DATA:  Shortness of breath.  EXAM: PORTABLE CHEST - 1 VIEW  COMPARISON:  05/10/2013.  FINDINGS: Normal sized heart. Clear lungs with normal vascularity. Normal appearing bones  IMPRESSION: Normal examination.   Electronically Signed   By: Enrique Sack M.D.   On: 06/05/2013 23:18    Scheduled Meds: . amiodarone  400 mg Oral BID  . aspirin  81 mg Oral Daily  . atorvastatin  40 mg Oral q1800  . carvedilol  3.125 mg Oral BID WC  . cefTRIAXone (ROCEPHIN)  IV  1 g Intravenous Q24H  . digoxin  0.0625 mg Oral Daily  . folic acid  1 mg Oral Daily  . furosemide  20 mg Oral QODAY  . lisinopril  5 mg Oral QHS  . rivaroxaban  20 mg Oral Q supper  . sodium chloride  3 mL Intravenous Q12H  . spironolactone  12.5 mg Oral Daily  . thiamine  100 mg Oral Daily    Continuous Infusions:   Principal Problem:   Atrial fibrillation with RVR Active Problems:   Chronic systolic CHF (congestive heart failure)   UTI (lower urinary tract infection)   Elevated troponin   Atrial fibrillation    Time spent: [facility] Pager [phone]. If 7PM-7AM, please contact night-coverage at [email or web address], password [facility] 06/07/2013, 10:34 AM  LOS: 2 days

## 2013-06-07 NOTE — Progress Notes (Addendum)
Advanced Heart Failure Rounding Note   Subjective:    Admitted yesterday with increased SOB due to Afib RVR. Received cardizem and converted. Carvedilol was also increased to 6.25 mg twice a day.   Denies SOB/CP/Orthopnea.   Objective:   Weight Range:  Vital Signs:   Temp:  [98.3 F (36.8 C)-99.4 F (37.4 C)] 98.6 F (37 C) (05/18 0533) Pulse Rate:  [74-92] 83 (05/18 0533) Resp:  [16-20] 17 (05/18 0533) BP: (84-106)/(50-64) 97/53 mmHg (05/18 0533) SpO2:  [97 %-99 %] 97 % (05/18 0533) Weight:  [132 lb (59.875 kg)] 132 lb (59.875 kg) (05/18 0533) Last BM Date: 06/05/13  Weight change: Filed Weights   06/05/13 2145 06/06/13 0127 06/07/13 0533  Weight: 132 lb (59.875 kg) 131 lb 9.8 oz (59.7 kg) 132 lb (59.875 kg)    Intake/Output:   Intake/Output Summary (Last 24 hours) at 06/07/13 0731 Last data filed at 06/07/13 0533  Gross per 24 hour  Intake    893 ml  Output    345 ml  Net    548 ml     Physical Exam: General:  Well appearing. No resp difficulty. Husband at bedside.  HEENT: normal Neck: supple. JVP 5-6. Carotids 2+ bilat; no bruits. No lymphadenopathy or thryomegaly appreciated. Cor: PMI nondisplaced. Regular rate & rhythm. No rubs, gallops or murmurs. Lungs: clear Abdomen: soft, nontender, nondistended. No hepatosplenomegaly. No bruits or masses. Good bowel sounds. Extremities: no cyanosis, clubbing, rash, edema Neuro: alert & orientedx3, cranial nerves grossly intact. moves all 4 extremities w/o difficulty. Affect pleasant  Telemetry:  SR 90s   Labs: Basic Metabolic Panel:  Recent Labs Lab 06/02/13 1119 06/05/13 2208  NA 141 136*  K 3.9 3.6*  CL 101 100  CO2 27 21  GLUCOSE 85 123*  BUN 9 11  CREATININE 1.00 1.02  CALCIUM 9.9 9.2    Liver Function Tests: No results found for this basename: AST, ALT, ALKPHOS, BILITOT, PROT, ALBUMIN,  in the last 168 hours No results found for this basename: LIPASE, AMYLASE,  in the last 168 hours No results  found for this basename: AMMONIA,  in the last 168 hours  CBC:  Recent Labs Lab 06/05/13 2208  WBC 6.8  NEUTROABS 4.4  HGB 13.3  HCT 39.3  MCV 94.5  PLT 215    Cardiac Enzymes:  Recent Labs Lab 06/06/13 0028 06/06/13 0800 06/06/13 1210 06/06/13 1802 06/07/13 0250  TROPONINI 1.31* 3.73* 3.57* 2.87* 2.08*    BNP: BNP (last 3 results)  Recent Labs  05/09/13 0340  PROBNP 8584.0*     Other results:    Imaging: Dg Chest Port 1 View  06/05/2013   CLINICAL DATA:  Shortness of breath.  EXAM: PORTABLE CHEST - 1 VIEW  COMPARISON:  05/10/2013.  FINDINGS: Normal sized heart. Clear lungs with normal vascularity. Normal appearing bones  IMPRESSION: Normal examination.   Electronically Signed   By: Enrique Sack M.D.   On: 06/05/2013 23:18      Medications:     Scheduled Medications: . aspirin  81 mg Oral Daily  . atorvastatin  40 mg Oral q1800  . carvedilol  6.25 mg Oral BID WC  . cefTRIAXone (ROCEPHIN)  IV  1 g Intravenous Q24H  . digoxin  0.0625 mg Oral Daily  . folic acid  1 mg Oral Daily  . furosemide  20 mg Oral QODAY  . lisinopril  5 mg Oral QHS  . rivaroxaban  20 mg Oral Q supper  . sodium chloride  3 mL Intravenous Q12H  . spironolactone  12.5 mg Oral Daily  . thiamine  100 mg Oral Daily     Infusions:     PRN Medications:  bisacodyl, promethazine   Assessment/Plan  Mrs Hermance is 58 year old with ICM EF 20-25% admitted with A fib RVR.   1. A fib RVR- converted to SR with cardizem. Will start amiodarone 400 mg twice a day. Continue Xarelto 20 mg daily.  Check TSH, LFTs, CBC now.   2. Chronic Systolic Heart Failure ICM .   ECHO 05/08/13 EF 20-25%.  NYHA II- Volume status stable. Continue spiro 12.5 mg daily and 20 mg lasix every other day.  Cut back carvedilol to 3.125 mg twice a day as we are adding amiodarone today.  Continue digoxin 0.0625 mg daily ( dig level 0.4) . Continue lisinopril 5 mg at bed time.  Consult cardiac rehab.  Check BMET  now.    3. CAD -LHC with chronically occluded LAD, moderate disease in a large OM 2 and severe disease and small posterior lateral vessel. She did not have good targets for revascularization. Elevated troponin noted on admit but coming down. 3.7>3.5> 3.8>2.0. Continue aspirin and statin  4. UTI- on Rocephin. Culture pending. Will need coverage for 3 days.   5. Former Alcohol Abuse- remains off alcohol since  05/06/13  Will need to d/c back SNF in next day or so.    Length of Stay: 2   Amy D Clegg NP-C  06/07/2013, 7:31 AM  Advanced Heart Failure Team Pager [phone] (M-F; 7a - 4p)  Please contact [facility] for night-coverage after hours (4p -7a ) and weekends on [email or web address]  Patient seen with NP, agree with the above note.   She is now back in NSR on amiodarone and started on Xarelto.  Need to keep in NSR.  Continue other meds as above.  If she stays in NSR, likely back to [facility] tomorrow.  Will need echo 6 months after prior echo for assessment for ICD.  Volume status looks ok.  Elevated troponin likely demand ischemia with afib/RVR in the setting of known significant CAD.   Larey Dresser 06/07/2013 8:04 AM

## 2013-06-07 NOTE — Clinical Documentation Improvement (Signed)
Possible Clinical Conditions?  Moderate Protein Calorie Malnutrition  Other Condition Cannot clinically determine Supporting Information: -Non-severe (moderate) malnutrition in the context of chronic illness   Pt meets criteria for MODERATE MALNUTRITION in the context of CHRONIC ILLNESS as evidenced by 6% weight loss in one month, moderate muscle wasting, and estimated energy intake <75% of estimated energy needs for >/= 1 month.  INTERVENTION:  Provide Ensure Complete BID  Encourage adequate po intake  Provide Multivitamin with minerals daily Thank You, Joya Salm ,RN Clinical Documentation Specialist:  [phone]  [place] Information Management

## 2013-06-08 DIAGNOSIS — I5021 Acute systolic (congestive) heart failure: Secondary | ICD-10-CM

## 2013-06-08 LAB — CBC
HCT: 41.5 % (ref 36.0–46.0)
Hemoglobin: 13.6 g/dL (ref 12.0–15.0)
MCH: 31.5 pg (ref 26.0–34.0)
MCHC: 32.8 g/dL (ref 30.0–36.0)
MCV: 96.1 fL (ref 78.0–100.0)
PLATELETS: 234 10*3/uL (ref 150–400)
RBC: 4.32 MIL/uL (ref 3.87–5.11)
RDW: 12.9 % (ref 11.5–15.5)
WBC: 4.7 10*3/uL (ref 4.0–10.5)

## 2013-06-08 LAB — BASIC METABOLIC PANEL
BUN: 10 mg/dL (ref 6–23)
CALCIUM: 9.5 mg/dL (ref 8.4–10.5)
CO2: 25 mEq/L (ref 19–32)
CREATININE: 1.11 mg/dL — AB (ref 0.50–1.10)
Chloride: 105 mEq/L (ref 96–112)
GFR, EST AFRICAN AMERICAN: 62 mL/min — AB (ref >90)
GFR, EST NON AFRICAN AMERICAN: 54 mL/min — AB (ref >90)
Glucose, Bld: 101 mg/dL — ABNORMAL HIGH (ref 70–99)
Potassium: 4.1 mEq/L (ref 3.7–5.3)
Sodium: 143 mEq/L (ref 137–147)

## 2013-06-08 LAB — TROPONIN I: Troponin I: 0.93 ng/mL (ref <0.30)

## 2013-06-08 MED ORDER — AMIODARONE HCL 400 MG PO TABS: 400.0000 mg | ORAL_TABLET | Freq: Two times a day (BID) | ORAL | Status: DC

## 2013-06-08 MED ORDER — ADULT MULTIVITAMIN W/MINERALS CH: 1.0000 | ORAL_TABLET | Freq: Every day | ORAL | Status: DC

## 2013-06-08 MED ORDER — RIVAROXABAN 20 MG PO TABS: 20.0000 mg | ORAL_TABLET | Freq: Every day | ORAL | Status: DC

## 2013-06-08 MED ORDER — ENSURE COMPLETE PO LIQD: 237.0000 mL | Freq: Two times a day (BID) | ORAL | Status: DC

## 2013-06-08 NOTE — Discharge Instructions (Signed)
Information on my medicine - XARELTO (Rivaroxaban)  This medication education was reviewed with me or my healthcare representative as part of my discharge preparation.  The pharmacist that spoke with me during my hospital stay was:  [facility] Arlyss Repress, [facility]  Why was Xarelto prescribed for you? Xarelto was prescribed for you to reduce the risk of a blood clot forming that can cause a stroke if you have a medical condition called atrial fibrillation (a type of irregular heartbeat).  What do you need to know about xarelto ? Take your Xarelto ONCE DAILY at the same time every day with your evening meal. If you have difficulty swallowing the tablet whole, you may crush it and mix in applesauce just prior to taking your dose.  Take Xarelto exactly as prescribed by your doctor and DO NOT stop taking Xarelto without talking to the doctor who prescribed the medication.  Stopping without other stroke prevention medication to take the place of Xarelto may increase your risk of developing a clot that causes a stroke.  Refill your prescription before you run out.  After discharge, you should have regular check-up appointments with your healthcare provider that is prescribing your Xarelto.  In the future your dose may need to be changed if your kidney function or weight changes by a significant amount.  What do you do if you miss a dose? If you are taking Xarelto ONCE DAILY and you miss a dose, take it as soon as you remember on the same day then continue your regularly scheduled once daily regimen the next day. Do not take two doses of Xarelto at the same time or on the same day.   Important Safety Information A possible side effect of Xarelto is bleeding. You should call your healthcare provider right away if you experience any of the following:   Bleeding from an injury or your nose that does not stop.   Unusual colored urine (red or dark brown) or unusual colored stools (red or  black).   Unusual bruising for unknown reasons.   A serious fall or if you hit your head (even if there is no bleeding).  Some medicines may interact with Xarelto and might increase your risk of bleeding while on Xarelto. To help avoid this, consult your healthcare provider or pharmacist prior to using any new prescription or non-prescription medications, including herbals, vitamins, non-steroidal anti-inflammatory drugs (NSAIDs) and supplements.  This website has more information on Xarelto: [email or web address]

## 2013-06-08 NOTE — Progress Notes (Signed)
The patient did not have any complaints of pain or acute changes overnight.  Her VS remained stable.

## 2013-06-08 NOTE — Discharge Summary (Signed)
Physician Discharge Summary  [name] PJA:[id number] DOB: [birth year] DOA: 06/05/2013  PCP: No PCP Per Patient  Admit date: 06/05/2013 Discharge date: 06/08/2013  Time spent: >24minutes  Recommendations for Outpatient Follow-up:  Follow-up Information   Follow up with Glori Bickers, MD On 06/16/2013. (/Heart failure clinic at 11:30 AM)    Specialty:  Cardiology   Contact information:   [place] [address] [phone]       Please follow up. (SNF MD in 1-2DAYS)        Discharge Diagnoses:  Principal Problem:   Atrial fibrillation with RVR Active Problems:   Chronic systolic CHF (congestive heart failure)   UTI (lower urinary tract infection)   Elevated troponin   Atrial fibrillation   Malnutrition of moderate degree   Discharge Condition: Improved/stable  Diet recommendation: Low sodium heart healthy  Filed Weights   06/06/13 0127 06/07/13 0533 06/08/13 0658  Weight: 59.7 kg (131 lb 9.8 oz) 59.875 kg (132 lb) 60.011 kg (132 lb 4.8 oz)    History of present illness:  The patient is a 58 y.o. female who presents to the ED with sudden onset of SOB and chest pain. This occurred at her NH. Patient was found to be in A.Fib with RVR. She was given cardizem at the NH and spontaneously converted out of A.Fib back to NSR. She began to feel better and the chest pain resolved. She was admitted for further evaluation and management.   Hospital Course:  1.AFIB with RVR  -As discussed above, patient converted to NSR after cardizem was given prior to admit, and coreg was increased to 6.25 on admit, and continued on digoxin  -Cardiac enzymes were cycled and came back elevated, patient was having any further chest pain. Cardiology was consulted and Dr. Percival Spanish and saw patient and his impression was that it was secondary to the A. fib with RVR and no further workup was recommended -Patient was placed on xarelto and is to continue this upon  discharge -Cardiology followed up with patient and added amiodarone and coreg decreased back to 3.125  -She has remained in normal sinus rhythm and is to follow up with cardiology outpatient 2.Chronic systolic CHF - most recent echo showed EF 20-25%  -continue lasix every other day, spironolactone, coreg, amio added as above  -She is to followup in the heart failure clinic and 5/27 at 11:30 3.UTI - urine cx with insignificant growth, she completed 3 days of Rocephin and will require any further antibiotics upon discharge  4.Moderate malnutrition  -continue nutritional supplements and follow  5.h/o Alcohol abuse  , -sober since she has been at SNF   Procedures:  None  Consultations:  Cardiology  Discharge Exam: Filed Vitals:   06/08/13 1250  BP: 112/64  Pulse: 65  Temp:   Resp:   Exam:  General: alert & oriented x 3 In NAD  Cardiovascular: RRR, nl S1 s2  Respiratory: CTAB  Abdomen: soft +BS NT/ND, no masses palpable  Extremities: No cyanosis and no edema      Discharge Instructions You were cared for by a hospitalist during your hospital stay. If you have any questions about your discharge medications or the care you received while you were in the hospital after you are discharged, you can call the unit and asked to speak with the hospitalist on call if the hospitalist that took care of you is not available. Once you are discharged, your primary care physician will handle any further medical  issues. Please note that NO REFILLS for any discharge medications will be authorized once you are discharged, as it is imperative that you return to your primary care physician (or establish a relationship with a primary care physician if you do not have one) for your aftercare needs so that they can reassess your need for medications and monitor your lab values.  Discharge Instructions   Diet - low sodium heart healthy    Complete by:  As directed      Increase activity slowly     Complete by:  As directed             Medication List         amiodarone 400 MG tablet  Commonly known as:  PACERONE  Take 1 tablet (400 mg total) by mouth 2 (two) times daily.     aspirin 81 MG chewable tablet  Chew 1 tablet (81 mg total) by mouth daily.     atorvastatin 40 MG tablet  Commonly known as:  LIPITOR  Take 1 tablet (40 mg total) by mouth daily at 6 PM.     bisacodyl 5 MG EC tablet  Commonly known as:  DULCOLAX  Take 2 tablets (10 mg total) by mouth daily as needed for moderate constipation.     carvedilol 3.125 MG tablet  Commonly known as:  COREG  Take 1 tablet (3.125 mg total) by mouth 2 (two) times daily with a meal.     digoxin 0.125 MG tablet  Commonly known as:  LANOXIN  Take 0.0625 mg by mouth daily.     feeding supplement (ENSURE COMPLETE) Liqd  Take 237 mLs by mouth 2 (two) times daily between meals.     folic acid 1 MG tablet  Commonly known as:  FOLVITE  Take 1 tablet (1 mg total) by mouth daily.     furosemide 20 MG tablet  Commonly known as:  LASIX  Take 1 tablet (20 mg total) by mouth every other day.     levalbuterol 0.63 MG/3ML nebulizer solution  Commonly known as:  XOPENEX  Take 3 mLs (0.63 mg total) by nebulization every 6 (six) hours as needed for wheezing or shortness of breath.     lisinopril 5 MG tablet  Commonly known as:  PRINIVIL,ZESTRIL  Take 1 tablet (5 mg total) by mouth at bedtime.     multivitamin with minerals Tabs tablet  Take 1 tablet by mouth daily.     promethazine 25 MG tablet  Commonly known as:  PHENERGAN  Take 25 mg by mouth every 6 (six) hours as needed for nausea or vomiting.     rivaroxaban 20 MG Tabs tablet  Commonly known as:  XARELTO  Take 1 tablet (20 mg total) by mouth daily with supper.     spironolactone 12.5 mg Tabs tablet  Commonly known as:  ALDACTONE  Take 0.5 tablets (12.5 mg total) by mouth daily.     thiamine 100 MG tablet  Take 1 tablet (100 mg total) by mouth daily.        Allergies  Allergen Reactions  . Penicillins Rash      The results of significant diagnostics from this hospitalization (including imaging, microbiology, ancillary and laboratory) are listed below for reference.    Significant Diagnostic Studies: Dg Chest Port 1 View  06/05/2013   CLINICAL DATA:  Shortness of breath.  EXAM: PORTABLE CHEST - 1 VIEW  COMPARISON:  05/10/2013.  FINDINGS: Normal sized heart. Clear lungs with normal vascularity.  Normal appearing bones  IMPRESSION: Normal examination.   Electronically Signed   By: Enrique Sack M.D.   On: 06/05/2013 23:18   Dg Chest Port 1 View  05/10/2013   CLINICAL DATA:  Fluid overload.  EXAM: PORTABLE CHEST - 1 VIEW  COMPARISON:  05/07/2013  FINDINGS: Cardiac silhouette remains mildly enlarged, unchanged. The patient has taken a greater inspiration than on the prior study, and there is improved aeration of the lung bases. No airspace consolidation, pulmonary edema, pneumothorax, or definite pleural effusion is identified. There is mild elevation of the right hemidiaphragm.  IMPRESSION: Improved aeration of the lung bases. No evidence of new airspace disease.   Electronically Signed   By: Logan Bores   On: 05/10/2013 08:01    Microbiology: Recent Results (from the past 240 hour(s))  URINE CULTURE     Status: None   Collection Time    06/05/13 11:21 PM      Result Value Ref Range Status   Specimen Description URINE, RANDOM   Final   Special Requests NONE   Final   Culture  Setup Time     Final   Value: 06/06/2013 16:13     Performed at [facility]     Final   Value: 4,000 COLONIES/ML     Performed at [facility]   Culture     Final   Value: INSIGNIFICANT GROWTH     Performed at [facility]   Report Status 06/07/2013 FINAL   Final  MRSA PCR SCREENING     Status: None   Collection Time    06/06/13  2:11 AM      Result Value Ref Range Status   MRSA by PCR NEGATIVE  NEGATIVE Final   Comment:             The GeneXpert MRSA Assay (FDA     approved for NASAL specimens     only), is one component of a     comprehensive MRSA colonization     surveillance program. It is not     intended to diagnose MRSA     infection nor to guide or     monitor treatment for     MRSA infections.     Labs: Basic Metabolic Panel:  Recent Labs Lab 06/02/13 1119 06/05/13 2208 06/07/13 0844 06/08/13 0503  NA 141 136* 138 143  K 3.9 3.6* 4.1 4.1  CL 101 100 100 105  CO2 27 21 24 25   GLUCOSE 85 123* 122* 101*  BUN 9 11 9 10   CREATININE 1.00 1.02 1.03 1.11*  CALCIUM 9.9 9.2 9.3 9.5  MG  --   --  1.9  --    Liver Function Tests:  Recent Labs Lab 06/07/13 0844  AST 26  ALT 20  ALKPHOS 98  BILITOT 0.3  PROT 7.4  ALBUMIN 3.3*   No results found for this basename: LIPASE, AMYLASE,  in the last 168 hours No results found for this basename: AMMONIA,  in the last 168 hours CBC:  Recent Labs Lab 06/05/13 2208 06/07/13 0804 06/08/13 0503  WBC 6.8 7.0 4.7  NEUTROABS 4.4  --   --   HGB 13.3 13.5 13.6  HCT 39.3 40.7 41.5  MCV 94.5 96.0 96.1  PLT 215 232 234   Cardiac Enzymes:  Recent Labs Lab 06/06/13 1210 06/06/13 1802 06/07/13 0250 06/07/13 0804 06/08/13 0503  TROPONINI 3.57* 2.87* 2.08* 2.13* 0.93*   BNP: BNP (  last 3 results)  Recent Labs  05/09/13 0340  PROBNP 8584.0*   CBG: No results found for this basename: GLUCAP,  in the last 168 hours     Signed:  [facility] 06/08/2013, 12:57 PM

## 2013-06-08 NOTE — Progress Notes (Addendum)
Pt does not have insurance. Pt returning to [facility] where her medication is included in bill.

## 2013-06-08 NOTE — Progress Notes (Signed)
Advanced Heart Failure Rounding Note   Subjective:    Admitted SOB due to Afib RVR. Yesterday she was started on amiodarone and carvedilol was cut back to 3.125 mg twice a day. Weight unchanged.    Denies SOB/CP/Orthopnea.   Objective:   Weight Range:  Vital Signs:   Temp:  [97.8 F (36.6 C)-98.3 F (36.8 C)] 98.3 F (36.8 C) (05/19 0658) Pulse Rate:  [57-99] 57 (05/19 0658) Resp:  [16-18] 18 (05/19 0658) BP: (86-102)/(51-60) 96/54 mmHg (05/19 0658) SpO2:  [96 %-98 %] 96 % (05/19 0658) Weight:  [132 lb 4.8 oz (60.011 kg)] 132 lb 4.8 oz (60.011 kg) (05/19 0658) Last BM Date: 06/07/13  Weight change: Filed Weights   06/06/13 0127 06/07/13 0533 06/08/13 0658  Weight: 131 lb 9.8 oz (59.7 kg) 132 lb (59.875 kg) 132 lb 4.8 oz (60.011 kg)    Intake/Output:   Intake/Output Summary (Last 24 hours) at 06/08/13 1610 Last data filed at 06/07/13 2354  Gross per 24 hour  Intake    600 ml  Output      0 ml  Net    600 ml     Physical Exam: General:  Well appearing. No resp difficulty. Husband at bedside.  HEENT: normal Neck: supple. JVP 5-6. Carotids 2+ bilat; no bruits. No lymphadenopathy or thryomegaly appreciated. Cor: PMI nondisplaced. Regular rate & rhythm. No rubs, gallops or murmurs. Lungs: clear Abdomen: soft, nontender, nondistended. No hepatosplenomegaly. No bruits or masses. Good bowel sounds. Extremities: no cyanosis, clubbing, rash, edema Neuro: alert & orientedx3, cranial nerves grossly intact. moves all 4 extremities w/o difficulty. Affect pleasant  Telemetry:  SR 90s   Labs: Basic Metabolic Panel:  Recent Labs Lab 06/02/13 1119 06/05/13 2208 06/07/13 0844 06/08/13 0503  NA 141 136* 138 143  K 3.9 3.6* 4.1 4.1  CL 101 100 100 105  CO2 27 21 24 25   GLUCOSE 85 123* 122* 101*  BUN 9 11 9 10   CREATININE 1.00 1.02 1.03 1.11*  CALCIUM 9.9 9.2 9.3 9.5  MG  --   --  1.9  --     Liver Function Tests:  Recent Labs Lab 06/07/13 0844  AST 26  ALT 20   ALKPHOS 98  BILITOT 0.3  PROT 7.4  ALBUMIN 3.3*   No results found for this basename: LIPASE, AMYLASE,  in the last 168 hours No results found for this basename: AMMONIA,  in the last 168 hours  CBC:  Recent Labs Lab 06/05/13 2208 06/07/13 0804 06/08/13 0503  WBC 6.8 7.0 4.7  NEUTROABS 4.4  --   --   HGB 13.3 13.5 13.6  HCT 39.3 40.7 41.5  MCV 94.5 96.0 96.1  PLT 215 232 234    Cardiac Enzymes:  Recent Labs Lab 06/06/13 1210 06/06/13 1802 06/07/13 0250 06/07/13 0804 06/08/13 0503  TROPONINI 3.57* 2.87* 2.08* 2.13* 0.93*    BNP: BNP (last 3 results)  Recent Labs  05/09/13 0340  PROBNP 8584.0*     Other results:    Imaging: No results found.   Medications:     Scheduled Medications: . amiodarone  400 mg Oral BID  . aspirin  81 mg Oral Daily  . atorvastatin  40 mg Oral q1800  . carvedilol  3.125 mg Oral BID WC  . cefTRIAXone (ROCEPHIN)  IV  1 g Intravenous Q24H  . digoxin  0.0625 mg Oral Daily  . feeding supplement (ENSURE COMPLETE)  237 mL Oral BID BM  . folic acid  1  mg Oral Daily  . furosemide  20 mg Oral QODAY  . lisinopril  5 mg Oral QHS  . multivitamin with minerals  1 tablet Oral Daily  . rivaroxaban  20 mg Oral Q supper  . sodium chloride  3 mL Intravenous Q12H  . spironolactone  12.5 mg Oral Daily  . thiamine  100 mg Oral Daily    Infusions:    PRN Medications: bisacodyl, promethazine   Assessment/Plan  Mrs [name] is 58 year old with ICM EF 20-25% admitted with A fib RVR.   1. A fib RVR- converted to SR.  Continue amiodarone 400 mg twice a day. . Continue Xarelto 20 mg daily.    2. Chronic Systolic Heart Failure ICM .   ECHO 05/08/13 EF 20-25%.  NYHA II- Volume status stable. Continue spiro 12.5 mg daily and 20 mg lasix every other day.  Continue carvedilol to 3.125 mg twice a day. Continue digoxin 0.0625 mg daily ( dig level 0.4) . Continue lisinopril 5 mg at bed time. Continue cardiac rehab. Renal function stable.       3. CAD -LHC with chronically occluded LAD, moderate disease in a large OM 2 and severe disease and small posterior lateral vessel. She did not have good targets for revascularization. Elevated troponin noted on admit but coming down. 3.7>3.5> 3.8>2.0> 0.93  Continue aspirin and statin  4. UTI- on Rocephin. Culture pending. Will need coverage for 3 days.   5. Former Alcohol Abuse- remains off alcohol since  05/06/13  Ok to discharge to SNF. Has follow up in HF clinic 05/2713 @ 11:30  HF DC meds  - amiodarone 400 mg twice a day -Xarelto 20 mg daily -Carvedilol 3.125 mg twice a day  -Lisinopril 5 mg at bed time -Spironolactone 12.5 mg daily  -Aspirin 81 mg daily  - Atorvastatin 40 mg daily     Length of Stay: 3   Amy D Clegg NP-C  06/08/2013, 8:12 AM  Advanced Heart Failure Team Pager [phone] (M-F; 7a - 4p)  Please contact [facility] for night-coverage after hours (4p -7a ) and weekends on [email or web address]  Patient remains in NSR.  BP low after meds this morning.  No chest pain or dyspnea.  I think she can go home today but will use amiodarone 200 mg bid rather than 400 mg bid.  She will followup in CHF clinic in 1 week, will need echo 3 months post-prior echo.  She has now quit ETOH and smoking.   Larey Dresser 06/08/2013 10:07 AM

## 2013-06-11 NOTE — Clinical Social Work Psychosocial (Addendum)
    Clinical Social Work Department BRIEF PSYCHOSOCIAL ASSESSMENT 06/07/2013  Patient:  [name], [name]     Account Number:  [id number]     Admit date:  06/05/2013  Clinical Social Worker:  Iona Coach  Date/Time:  06/07/2013 05:30 PM  Referred by:  Physician  Date Referred:  06/07/2013 Referred for  Other - See comment   Other Referral:   From SNF- [facility]   Interview type:  Patient Other interview type:    PSYCHOSOCIAL DATA Living Status:  FACILITY Admitted from facility:  [place] Level of care:  [place] Primary support name:  None given by patient Primary support relationship to patient:   Degree of support available:   NA    CURRENT CONCERNS Current Concerns  Post-Acute Placement   Other Concerns:   Return to facility    [place] / PLAN 58 year old female- resident of [facility] where she has a current Letter Of Guarantee from a prior hospitalization. Mel Almond, SW at [facility] stated that she is working on getting patient moved to a group home in [place] one her Medicaid is approved. Will plan return to facility when medically stable with continued LOG until she can go to group home. fl2 placed on chart for MD's signature.   Assessment/plan status:  Psychosocial Support/Ongoing Assessment of Needs Other assessment/ plan:   Information/referral to community resources:   none at this time    PATIENT'S/FAMILY'S RESPONSE TO PLAN OF CARE: Patient is alert and oriented; she is agreeable to return to the SNF at d/c and states that they have been providing good care to her. She is aware of effors to get her to a lower level of care. Patient denies having any family to notify of her d/c back to facility. CSW provided support; patient denies any questions or concerns at this time.

## 2013-06-11 NOTE — Progress Notes (Signed)
Ok per MD for return to [facility] today via EMS and letter of guarantee. Hopefully will be able to go to a group home in the near future.  Patient is agreeable to return to the facility and bed is available per [facility]. Nursing notified to call report. No further CSW needs identified. CSW signing off. Lorie Phenix. [place], [place]

## 2013-06-16 ENCOUNTER — Ambulatory Visit (HOSPITAL_COMMUNITY)
Admission: RE | Admit: 2013-06-16 | Discharge: 2013-06-16 | Disposition: A | Discharge status: Home / Self Care | Payer: Medicaid Other | Source: Ambulatory Visit | Attending: Internal Medicine | Admitting: Internal Medicine

## 2013-06-16 VITALS — BP 101/62 | HR 58 | Resp 16 | Wt 132.2 lb

## 2013-06-16 DIAGNOSIS — I2510 Atherosclerotic heart disease of native coronary artery without angina pectoris: Secondary | ICD-10-CM | POA: Diagnosis not present

## 2013-06-16 DIAGNOSIS — K219 Gastro-esophageal reflux disease without esophagitis: Secondary | ICD-10-CM | POA: Insufficient documentation

## 2013-06-16 DIAGNOSIS — E785 Hyperlipidemia, unspecified: Secondary | ICD-10-CM | POA: Diagnosis not present

## 2013-06-16 DIAGNOSIS — Z720 Tobacco use: Secondary | ICD-10-CM

## 2013-06-16 DIAGNOSIS — R29898 Other symptoms and signs involving the musculoskeletal system: Secondary | ICD-10-CM | POA: Insufficient documentation

## 2013-06-16 DIAGNOSIS — I69998 Other sequelae following unspecified cerebrovascular disease: Secondary | ICD-10-CM | POA: Insufficient documentation

## 2013-06-16 DIAGNOSIS — I10 Essential (primary) hypertension: Secondary | ICD-10-CM

## 2013-06-16 DIAGNOSIS — F411 Generalized anxiety disorder: Secondary | ICD-10-CM | POA: Diagnosis not present

## 2013-06-16 DIAGNOSIS — Z7982 Long term (current) use of aspirin: Secondary | ICD-10-CM | POA: Insufficient documentation

## 2013-06-16 DIAGNOSIS — Z7901 Long term (current) use of anticoagulants: Secondary | ICD-10-CM | POA: Diagnosis not present

## 2013-06-16 DIAGNOSIS — I509 Heart failure, unspecified: Secondary | ICD-10-CM | POA: Insufficient documentation

## 2013-06-16 DIAGNOSIS — F17200 Nicotine dependence, unspecified, uncomplicated: Secondary | ICD-10-CM

## 2013-06-16 DIAGNOSIS — F1010 Alcohol abuse, uncomplicated: Secondary | ICD-10-CM | POA: Insufficient documentation

## 2013-06-16 DIAGNOSIS — I5022 Chronic systolic (congestive) heart failure: Secondary | ICD-10-CM | POA: Insufficient documentation

## 2013-06-16 DIAGNOSIS — Z87891 Personal history of nicotine dependence: Secondary | ICD-10-CM | POA: Insufficient documentation

## 2013-06-16 DIAGNOSIS — I4891 Unspecified atrial fibrillation: Secondary | ICD-10-CM | POA: Diagnosis not present

## 2013-06-16 NOTE — Progress Notes (Addendum)
Patient ID: [name], female   DOB: [birth year], 58 y.o.   MRN: [id number]   HPI: [name] is a 58 y/o female, with a history of ETOH abuse but with no prior cardiac history, who was admitted by [facility] on 05/06/13 for pancreatitis. She had severely reduced systolic function with an EF of 20-25%. She diuresed with IV lasix and transitioned to po lasix. 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 She was not placed on bb due to low output. She was able to tolerate lisinopril at bedtime and spironolactone 12.5 mg daily. She was discharged to [facility]. Discharge weight was 140 pounds.   Admitted to [facility] may 17 with A fib RVR. Started on amiodarone 400 mg tiwce a day and Xarelto 20 mg daily. Chemically converted to NSR.   She returns for post hospital follow up. Denies SOB/PND/Orthopnea. She remains at  [facility] SNF. Albe to walk 5-6 times a day 20 minutes a day. Weight at SNF 132-133 pounds.   Medicaid is pending. Weight 132-135 pounds.  She can not pay for medications. Remains at [facility] SNF.   05/06/13 RHC/LHC  RA 7  RV  PA 31/19 (24)  PCWP 10  PA Sat 53  Fick CO 2.90 CI 1.74  Thermo CO 2.4 CI 1.4  LHC---> chronically occluded LAD, moderate disease in a large OM 2 and severe disease and small posterior lateral vessel  Labs 05/15/13 K 3.7 Creatinine 0.97 Labs 06/02/13 K 3.9 Creatinine 1.0 Dig level 1.3 --> cut back dig to 0.0625 mg Labs 06/08/13 K 4.7 Creatinine 1.1 Dig level 0.4   ROS: All systems negative except as listed in HPI, PMH and Problem List.  Past Medical History  Diagnosis Date  . Diverticulosis   . Pancreatitis   . Hypertension   . Stroke 2011    Mild left weakness  . Hyperlipidemia   . Anxiety   . GERD (gastroesophageal reflux disease)   . ETOH abuse   . CHF (congestive heart failure)     EF 25%    Current Outpatient Prescriptions  Medication Sig Dispense Refill  .  ALPRAZolam (XANAX) 0.25 MG tablet Take 0.25 mg by mouth 2 (two) times daily as needed for anxiety.      Marland Kitchen amiodarone (PACERONE) 400 MG tablet Take 1 tablet (400 mg total) by mouth 2 (two) times daily.      Marland Kitchen aspirin 81 MG chewable tablet Chew 1 tablet (81 mg total) by mouth daily.      Marland Kitchen atorvastatin (LIPITOR) 40 MG tablet Take 1 tablet (40 mg total) by mouth daily at 6 PM.      . bisacodyl (DULCOLAX) 5 MG EC tablet Take 2 tablets (10 mg total) by mouth daily as needed for moderate constipation.  30 tablet  0  . carvedilol (COREG) 3.125 MG tablet Take 1 tablet (3.125 mg total) by mouth 2 (two) times daily with a meal.  60 tablet  6  . digoxin (LANOXIN) 0.125 MG tablet Take 0.0625 mg by mouth daily.      . feeding supplement, ENSURE COMPLETE, (ENSURE COMPLETE) LIQD Take 237 mLs by mouth 2 (two) times daily between meals.      . folic acid (FOLVITE) 1 MG tablet Take 1 tablet (1 mg total) by mouth daily.      . furosemide (LASIX) 20 MG tablet Take 1 tablet (20 mg total) by mouth every other day.  [place]  tablet    . levalbuterol (XOPENEX) 0.63 MG/3ML nebulizer solution Take 3 mLs (0.63 mg total) by nebulization every 6 (six) hours as needed for wheezing or shortness of breath.  3 mL  12  . lisinopril (PRINIVIL,ZESTRIL) 5 MG tablet Take 1 tablet (5 mg total) by mouth at bedtime.      Marland Kitchen LORazepam (ATIVAN) 0.5 MG tablet Take 0.25 mg by mouth 2 (two) times daily as needed for anxiety.      . Multiple Vitamin (MULTIVITAMIN WITH MINERALS) TABS tablet Take 1 tablet by mouth daily.      . promethazine (PHENERGAN) 25 MG tablet Take 25 mg by mouth every 6 (six) hours as needed for nausea or vomiting.      . rivaroxaban (XARELTO) 20 MG TABS tablet Take 1 tablet (20 mg total) by mouth daily with supper.  30 tablet    . spironolactone (ALDACTONE) 12.5 mg TABS tablet Take 0.5 tablets (12.5 mg total) by mouth daily.      Marland Kitchen thiamine 100 MG tablet Take 1 tablet (100 mg total) by mouth daily.       No current  facility-administered medications for this encounter.     PHYSICAL EXAM: Filed Vitals:   06/16/13 1133  BP: 101/62  Pulse: 58  Resp: 16  Weight: 132 lb 4 oz (59.988 kg)  SpO2: 100%    General:  Well appearing. No resp difficulty. Ambulated in the clinic with no difficulties.  HEENT: normal Neck: supple. JVP flat. Carotids 2+ bilaterally; no bruits. No lymphadenopathy or thryomegaly appreciated. Cor: PMI normal. Regular rate & rhythm. No rubs, gallops or murmurs. Lungs: clear Abdomen: soft, nontender, nondistended. No hepatosplenomegaly. No bruits or masses. Good bowel sounds. Extremities: no cyanosis, clubbing, rash, edema Neuro: alert & orientedx3, cranial nerves grossly intact. Moves all 4 extremities w/o difficulty. Affect pleasant.  EKG: Sinus Bradycardia 47 bpm   ASSESSMENT & PLAN: I reviewed May 19th  discharge summary.   1. Chronic Systolic Heart Failure: Primarily  ICM though ETOH may play a role.  ECHO 05/08/13  EF 20-25%,  She had low output on RHC.    Overall she is doing great. NYHA II. Volume status stable.  Continue lasix 20 mg qod and spironolactone 12.5 mg daily.  - Continuied carvedilol 3.125 mg twice a day can not up titrate due to bradycardia. Continue digoxin 0.125 mg daily. Dig level 0.4  - Continue lisinopril 5 mg at bed time. Dizziness resolved. .  - She will need to stay off ETOH as this certainly may have played a role in her cardiomyopathy.  - Reinforced daily weights, low salt food choices, and limiting fluid intake to < 2 liters per day,  - Plan to repeat ECHO within 3 months after HF meds optimized. Should be around the end of July.    -HF SW following for assistance with disposition and rehab.  2. CAD: LHC with chronically occluded LAD, moderate disease in a large OM 2 and severe disease and small posterior lateral vessel. She did not have good targets for revascularization.No chest pain.  Continue aspirin and statin.      3. Former Smoker: Encouraged  to remain off tobacco.  4. Acute pancreatitis: Due to ETOH, now resolved.   5. Social Issues: Plan to d/c to [facility] after she completes rehab. She will need to be followed closely in HF clinic. Referred to HF SW to assist with disposition into the community.  6. Former Alcohol Abuse- Needs to remain completely  off alcohol.  7. PAF- Sinus Loletha Grayer today.  Cut back amiodarone to 200 mg twice a day. In two weeks will cut back to 200 mg daily.  On Xarelto 20 mg daily.   Follow up in 2 weeks and plan to cut back amiodarone to 200 mg daily at that time.  Check TSH, LFTS, and lipids at that time.     Malachi Bonds Clegg NP-C  11:42 AM

## 2013-06-17 NOTE — Addendum Note (Signed)
Encounter addended by: Asencion Gowda, CCT on: 06/17/2013  9:47 AM<BR>     Documentation filed: Charges VN

## 2013-06-19 ENCOUNTER — Emergency Department (HOSPITAL_COMMUNITY)
Admission: EM | Admit: 2013-06-19 | Discharge: 2013-06-19 | Disposition: A | Discharge status: Home / Self Care | Payer: Medicaid Other | Attending: Emergency Medicine | Admitting: Emergency Medicine

## 2013-06-19 ENCOUNTER — Emergency Department (HOSPITAL_COMMUNITY): Payer: Medicaid Other

## 2013-06-19 ENCOUNTER — Encounter (HOSPITAL_COMMUNITY): Payer: Self-pay | Admitting: Emergency Medicine

## 2013-06-19 DIAGNOSIS — Z880 Allergy status to penicillin: Secondary | ICD-10-CM | POA: Diagnosis not present

## 2013-06-19 DIAGNOSIS — Z7982 Long term (current) use of aspirin: Secondary | ICD-10-CM | POA: Diagnosis not present

## 2013-06-19 DIAGNOSIS — I509 Heart failure, unspecified: Secondary | ICD-10-CM | POA: Insufficient documentation

## 2013-06-19 DIAGNOSIS — Z79899 Other long term (current) drug therapy: Secondary | ICD-10-CM | POA: Diagnosis not present

## 2013-06-19 DIAGNOSIS — Z8673 Personal history of transient ischemic attack (TIA), and cerebral infarction without residual deficits: Secondary | ICD-10-CM | POA: Insufficient documentation

## 2013-06-19 DIAGNOSIS — I10 Essential (primary) hypertension: Secondary | ICD-10-CM | POA: Diagnosis not present

## 2013-06-19 DIAGNOSIS — Z87891 Personal history of nicotine dependence: Secondary | ICD-10-CM | POA: Insufficient documentation

## 2013-06-19 DIAGNOSIS — N12 Tubulo-interstitial nephritis, not specified as acute or chronic: Secondary | ICD-10-CM | POA: Diagnosis not present

## 2013-06-19 DIAGNOSIS — Z8719 Personal history of other diseases of the digestive system: Secondary | ICD-10-CM | POA: Diagnosis not present

## 2013-06-19 DIAGNOSIS — E785 Hyperlipidemia, unspecified: Secondary | ICD-10-CM | POA: Insufficient documentation

## 2013-06-19 DIAGNOSIS — R109 Unspecified abdominal pain: Secondary | ICD-10-CM | POA: Diagnosis present

## 2013-06-19 DIAGNOSIS — F411 Generalized anxiety disorder: Secondary | ICD-10-CM | POA: Insufficient documentation

## 2013-06-19 LAB — CBC WITH DIFFERENTIAL/PLATELET
BASOS PCT: 0 % (ref 0–1)
Basophils Absolute: 0 10*3/uL (ref 0.0–0.1)
Eosinophils Absolute: 0.1 10*3/uL (ref 0.0–0.7)
Eosinophils Relative: 0 % (ref 0–5)
HEMATOCRIT: 37.4 % (ref 36.0–46.0)
Hemoglobin: 12.6 g/dL (ref 12.0–15.0)
Lymphocytes Relative: 7 % — ABNORMAL LOW (ref 12–46)
Lymphs Abs: 1.1 10*3/uL (ref 0.7–4.0)
MCH: 31.9 pg (ref 26.0–34.0)
MCHC: 33.7 g/dL (ref 30.0–36.0)
MCV: 94.7 fL (ref 78.0–100.0)
MONO ABS: 1.1 10*3/uL — AB (ref 0.1–1.0)
Monocytes Relative: 7 % (ref 3–12)
NEUTROS ABS: 13.3 10*3/uL — AB (ref 1.7–7.7)
NEUTROS PCT: 86 % — AB (ref 43–77)
PLATELETS: 327 10*3/uL (ref 150–400)
RBC: 3.95 MIL/uL (ref 3.87–5.11)
RDW: 13.2 % (ref 11.5–15.5)
WBC: 15.7 10*3/uL — AB (ref 4.0–10.5)

## 2013-06-19 LAB — URINALYSIS, ROUTINE W REFLEX MICROSCOPIC
BILIRUBIN URINE: NEGATIVE
Glucose, UA: NEGATIVE mg/dL
HGB URINE DIPSTICK: NEGATIVE
Ketones, ur: NEGATIVE mg/dL
Nitrite: NEGATIVE
PROTEIN: NEGATIVE mg/dL
Specific Gravity, Urine: 1.021 (ref 1.005–1.030)
UROBILINOGEN UA: 0.2 mg/dL (ref 0.0–1.0)
pH: 7 (ref 5.0–8.0)

## 2013-06-19 LAB — COMPREHENSIVE METABOLIC PANEL
ALT: 20 U/L (ref 0–35)
AST: 20 U/L (ref 0–37)
Albumin: 3.5 g/dL (ref 3.5–5.2)
Alkaline Phosphatase: 103 U/L (ref 39–117)
BUN: 15 mg/dL (ref 6–23)
CO2: 25 mEq/L (ref 19–32)
Calcium: 9.6 mg/dL (ref 8.4–10.5)
Chloride: 100 mEq/L (ref 96–112)
Creatinine, Ser: 1.35 mg/dL — ABNORMAL HIGH (ref 0.50–1.10)
GFR calc Af Amer: 49 mL/min — ABNORMAL LOW (ref >90)
GFR calc non Af Amer: 42 mL/min — ABNORMAL LOW (ref >90)
Glucose, Bld: 118 mg/dL — ABNORMAL HIGH (ref 70–99)
Potassium: 4.4 mEq/L (ref 3.7–5.3)
Sodium: 140 mEq/L (ref 137–147)
Total Bilirubin: 0.4 mg/dL (ref 0.3–1.2)
Total Protein: 7.5 g/dL (ref 6.0–8.3)

## 2013-06-19 LAB — URINE MICROSCOPIC-ADD ON

## 2013-06-19 LAB — LIPASE, BLOOD: Lipase: 31 U/L (ref 11–59)

## 2013-06-19 MED ORDER — ONDANSETRON HCL 4 MG/2ML IJ SOLN
4.0000 mg | Freq: Once | INTRAMUSCULAR | Status: AC
  Administered 2013-06-19: 4 mg via INTRAVENOUS

## 2013-06-19 MED ORDER — DEXTROSE 5 % IV SOLN
1.0000 g | Freq: Once | INTRAVENOUS | Status: AC
  Administered 2013-06-19: 1 g via INTRAVENOUS
  Filled 2013-06-19: qty 10

## 2013-06-19 MED ORDER — IOHEXOL 300 MG/ML  SOLN
25.0000 mL | INTRAMUSCULAR | Status: AC | PRN
  Administered 2013-06-19 (×2): 25 mL via ORAL

## 2013-06-19 MED ORDER — IOHEXOL 300 MG/ML  SOLN
100.0000 mL | Freq: Once | INTRAMUSCULAR | Status: AC | PRN
  Administered 2013-06-19: 100 mL via INTRAVENOUS

## 2013-06-19 MED ORDER — FENTANYL CITRATE 0.05 MG/ML IJ SOLN
50.0000 ug | Freq: Once | INTRAMUSCULAR | Status: AC
  Administered 2013-06-19: 50 ug via INTRAVENOUS
  Filled 2013-06-19: qty 2

## 2013-06-19 MED ORDER — CEPHALEXIN 500 MG PO CAPS: 500.0000 mg | ORAL_CAPSULE | Freq: Three times a day (TID) | ORAL | Status: DC

## 2013-06-19 MED ORDER — MORPHINE SULFATE 4 MG/ML IJ SOLN
4.0000 mg | Freq: Once | INTRAMUSCULAR | Status: DC
  Filled 2013-06-19: qty 1

## 2013-06-19 MED ORDER — ONDANSETRON HCL 4 MG/2ML IJ SOLN
4.0000 mg | Freq: Once | INTRAMUSCULAR | Status: DC
  Filled 2013-06-19: qty 2

## 2013-06-19 NOTE — ED Notes (Signed)
Pt remains in CT at the time.

## 2013-06-19 NOTE — ED Provider Notes (Signed)
CSN: [id number]     Arrival date & time 06/19/13  0244 History   First MD Initiated Contact with Patient 06/19/13 0448     Chief Complaint  Patient presents with  . Abdominal Pain     (Consider location/radiation/quality/duration/timing/severity/associated sxs/prior Treatment) HPI 58 year old female presents to emergency department from her nursing facility with complaint of abdominal pain, nausea and vomiting.  Patient reports symptoms started yesterday around 1 PM.  Patient reports it feels that her pancreatitis.  Pain is epigastric.  No fevers or chills.  She reports normal bowel movements. Past Medical History  Diagnosis Date  . Diverticulosis   . Pancreatitis   . Hypertension   . Stroke 2011    Mild left weakness  . Hyperlipidemia   . Anxiety   . GERD (gastroesophageal reflux disease)   . ETOH abuse   . CHF (congestive heart failure)     EF 25%   Past Surgical History  Procedure Laterality Date  . Colostomy    . Ileostomy    . Colostomy takedown     Family History  Problem Relation Age of Onset  . CAD Father 25    MI  . CAD Mother 28    MI  . CAD Sister 53    Stent  . CAD Brother 58    MI   History  Substance Use Topics  . Smoking status: Former Smoker -- 1.00 packs/day for 40 years    Types: Cigarettes  . Smokeless tobacco: Never Used     Comment: Quit April   . Alcohol Use: Yes     Comment: 2 3 TIMES A WEEK   OB History   Grav Para Term Preterm Abortions TAB SAB Ect Mult Living                 Review of Systems  See History of Present Illness; otherwise all other systems are reviewed and negative   Allergies  Penicillins  Home Medications   Prior to Admission medications   Medication Sig Start Date End Date Taking? Authorizing Provider  amiodarone (PACERONE) 400 MG tablet Take 1 tablet (400 mg total) by mouth 2 (two) times daily. 06/08/13  Yes Adeline Saralyn Pilar, MD  aspirin 81 MG chewable tablet Chew 1 tablet (81 mg total) by mouth daily.  05/14/13  Yes Geradine Girt, DO  atorvastatin (LIPITOR) 40 MG tablet Take 1 tablet (40 mg total) by mouth daily at 6 PM. 05/14/13  Yes Geradine Girt, DO  bisacodyl (DULCOLAX) 5 MG EC tablet Take 2 tablets (10 mg total) by mouth daily as needed for moderate constipation. 05/14/13  Yes Geradine Girt, DO  carvedilol (COREG) 3.125 MG tablet Take 1 tablet (3.125 mg total) by mouth 2 (two) times daily with a meal. 06/02/13  Yes Amy D Clegg, NP  digoxin (LANOXIN) 0.125 MG tablet Take 0.0625 mg by mouth daily.   Yes Historical Provider, MD  folic acid (FOLVITE) 1 MG tablet Take 1 tablet (1 mg total) by mouth daily. 05/14/13  Yes Geradine Girt, DO  furosemide (LASIX) 20 MG tablet Take 1 tablet (20 mg total) by mouth every other day. 05/15/13  Yes Geradine Girt, DO  levalbuterol (XOPENEX) 0.63 MG/3ML nebulizer solution Take 3 mLs (0.63 mg total) by nebulization every 6 (six) hours as needed for wheezing or shortness of breath. 05/14/13  Yes Geradine Girt, DO  lisinopril (PRINIVIL,ZESTRIL) 5 MG tablet Take 1 tablet (5 mg total) by mouth at bedtime. 05/14/13  Yes Geradine Girt, DO  LORazepam (ATIVAN) 0.5 MG tablet Take 0.25 mg by mouth 2 (two) times daily as needed for anxiety.   Yes Historical Provider, MD  Multiple Vitamin (MULTIVITAMIN WITH MINERALS) TABS tablet Take 1 tablet by mouth daily. 06/08/13  Yes Adeline Saralyn Pilar, MD  promethazine (PHENERGAN) 25 MG tablet Take 25 mg by mouth every 6 (six) hours as needed for nausea or vomiting.   Yes Historical Provider, MD  rivaroxaban (XARELTO) 20 MG TABS tablet Take 1 tablet (20 mg total) by mouth daily with supper. 06/08/13  Yes Adeline Saralyn Pilar, MD  spironolactone (ALDACTONE) 12.5 mg TABS tablet Take 0.5 tablets (12.5 mg total) by mouth daily. 05/14/13  Yes Geradine Girt, DO  thiamine 100 MG tablet Take 1 tablet (100 mg total) by mouth daily. 05/14/13  Yes Jessica U Vann, DO   BP 112/60  Pulse 79  Temp(Src) 97.8 F (36.6 C)  Resp 15  SpO2 99% Physical Exam   Nursing note and vitals reviewed. Constitutional: She is oriented to person, place, and time. She appears well-developed and well-nourished. She appears distressed (uncomfortable appearing).  HENT:  Head: Normocephalic and atraumatic.  Nose: Nose normal.  Mouth/Throat: Oropharynx is clear and moist.  Eyes: Conjunctivae and EOM are normal. Pupils are equal, round, and reactive to light.  Neck: Normal range of motion. Neck supple. No JVD present. No tracheal deviation present. No thyromegaly present.  Cardiovascular: Normal rate, regular rhythm, normal heart sounds and intact distal pulses.  Exam reveals no gallop and no friction rub.   No murmur heard. Pulmonary/Chest: Effort normal and breath sounds normal. No stridor. No respiratory distress. She has no wheezes. She has no rales. She exhibits no tenderness.  Abdominal: Soft. She exhibits no distension and no mass. There is tenderness (tenderness in epigastrium and right lower and left lower quadrants). There is no rebound and no guarding.  Bowel sounds slightly hyperactive  Musculoskeletal: Normal range of motion. She exhibits no edema and no tenderness.  Lymphadenopathy:    She has no cervical adenopathy.  Neurological: She is alert and oriented to person, place, and time. She exhibits normal muscle tone. Coordination normal.  Skin: Skin is warm and dry. No rash noted. No erythema. No pallor.  Psychiatric: She has a normal mood and affect. Her behavior is normal. Judgment and thought content normal.    ED Course  Procedures (including critical care time) Labs Review Labs Reviewed  CBC WITH DIFFERENTIAL - Abnormal; Notable for the following:    WBC 15.7 (*)    Neutrophils Relative % 86 (*)    Neutro Abs 13.3 (*)    Lymphocytes Relative 7 (*)    Monocytes Absolute 1.1 (*)    All other components within normal limits  COMPREHENSIVE METABOLIC PANEL - Abnormal; Notable for the following:    Glucose, Bld 118 (*)    Creatinine, Ser  1.35 (*)    GFR calc non Af Amer 42 (*)    GFR calc Af Amer 49 (*)    All other components within normal limits  URINALYSIS, ROUTINE W REFLEX MICROSCOPIC - Abnormal; Notable for the following:    APPearance CLOUDY (*)    Leukocytes, UA MODERATE (*)    All other components within normal limits  URINE MICROSCOPIC-ADD ON - Abnormal; Notable for the following:    Bacteria, UA FEW (*)    All other components within normal limits  URINE CULTURE  LIPASE, BLOOD    Imaging Review No results  found.   EKG Interpretation None      MDM   Final diagnoses:  None    58 year old female with 14 hours of abdominal pain, history of pancreatitis.  Her lipase today is normal.  She is significantly tender in her lower quadrants.  She has history of diverticulosis.  She is noted to have leukocytosis.  Plan for CT abdomen pelvis to further assess for possible diverticulitis.  Care passed to Dr. Vanita Panda awaiting CT scan.    Kalman Drape, MD 06/19/13 [phone]

## 2013-06-19 NOTE — ED Notes (Signed)
The pt arrived by [facility] from [place] with abd pain  n v since 1300.  Hx of pancreatitis .  D/c from this hospital may 19th.  She was given some anti-medicine at the nursing home not sure  what

## 2013-06-19 NOTE — ED Notes (Signed)
[facility] at bedside to transport back to facility. All paperwork, belongings and prescription sent with patient.

## 2013-06-19 NOTE — ED Notes (Signed)
Report given to Levada Dy, RN at nursing facility. Pt to be transported back to facility via [facility].

## 2013-06-19 NOTE — ED Notes (Signed)
Pt drinking oral contrast at the time. Vital signs stable. States 1/10 abdominal pain, states she feels much better after receiving medication. No signs of distress noted at present.

## 2013-06-19 NOTE — Discharge Instructions (Signed)
As discussed, it is important that you monitor your condition carefully, and do not hesitate to return here if you develop a fever that does not improve with Tylenol, or any new, or concerning changes in your condition.    Pyelonephritis, Adult Pyelonephritis is a kidney infection. In general, there are 2 main types of pyelonephritis:  Infections that come on quickly without any warning (acute pyelonephritis).  Infections that persist for a long period of time (chronic pyelonephritis). CAUSES  Two main causes of pyelonephritis are:  Bacteria traveling from the bladder to the kidney. This is a problem especially in pregnant women. The urine in the bladder can become filled with bacteria from multiple causes, including:  Inflammation of the prostate gland (prostatitis).  Sexual intercourse in females.  Bladder infection (cystitis).  Bacteria traveling from the bloodstream to the tissue part of the kidney. Problems that may increase your risk of getting a kidney infection include:  Diabetes.  Kidney stones or bladder stones.  Cancer.  Catheters placed in the bladder.  Other abnormalities of the kidney or ureter. SYMPTOMS   Abdominal pain.  Pain in the side or flank area.  Fever.  Chills.  Upset stomach.  Blood in the urine (dark urine).  Frequent urination.  Strong or persistent urge to urinate.  Burning or stinging when urinating. DIAGNOSIS  Your caregiver may diagnose your kidney infection based on your symptoms. A urine sample may also be taken. TREATMENT  In general, treatment depends on how severe the infection is.   If the infection is mild and caught early, your caregiver may treat you with oral antibiotics and send you home.  If the infection is more severe, the bacteria may have gotten into the bloodstream. This will require intravenous (IV) antibiotics and a hospital stay. Symptoms may include:  High fever.  Severe flank pain.  Shaking  chills.  Even after a hospital stay, your caregiver may require you to be on oral antibiotics for a period of time.  Other treatments may be required depending upon the cause of the infection. HOME CARE INSTRUCTIONS   Take your antibiotics as directed. Finish them even if you start to feel better.  Make an appointment to have your urine checked to make sure the infection is gone.  Drink enough fluids to keep your urine clear or pale yellow.  Take medicines for the bladder if you have urgency and frequency of urination as directed by your caregiver. SEEK IMMEDIATE MEDICAL CARE IF:   You have a fever or persistent symptoms for more than 2-3 days.  You have a fever and your symptoms suddenly get worse.  You are unable to take your antibiotics or fluids.  You develop shaking chills.  You experience extreme weakness or fainting.  There is no improvement after 2 days of treatment. MAKE SURE YOU:  Understand these instructions.  Will watch your condition.  Will get help right away if you are not doing well or get worse. Document Released: 01/07/2005 Document Revised: 07/09/2011 Document Reviewed: 06/13/2010 [facility] Patient Information 2014 [place], [place].

## 2013-06-19 NOTE — ED Notes (Signed)
Pt finished drinking oral contrast.

## 2013-06-19 NOTE — ED Provider Notes (Signed)
9:23 AM Patient's care assumed from Dr. Sharol Given. On re-check she is pain-free, in NAD. Results d/w with the patient. She will have ABX for pyelo, and return to her NH.  Return precautions / care instructions d/w the patient.   Carmin Muskrat, MD 06/19/13 [phone]

## 2013-06-20 LAB — URINE CULTURE

## 2013-06-29 NOTE — Progress Notes (Signed)
Patient ID: [name], female   DOB: Sep 30, 1955, 58 y.o.   MRN: [id number]  HPI: [name] is a 58 y/o female, with a history of ETOH abuse but with no prior cardiac history, who was admitted by [facility] on 05/06/13 for pancreatitis. She had severely reduced systolic function with an EF of 20-25%. She diuresed with IV lasix and transitioned to po lasix. 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 She was not placed on bb due to low output. She was able to tolerate lisinopril at bedtime and spironolactone 12.5 mg daily. She was discharged to [facility]. Discharge weight was 140 pounds.   Admitted to [facility] may 17 with A fib RVR. Started on amiodarone 400 mg tiwce a day and Xarelto 20 mg daily. Chemically converted to NSR.   She returns for follow up. Last visit amiodarone was cut back to 200 mg twice a day due to bradycardia. On May 30  she was evaluated in [facility] ED with abdominal pain and treated for pyelonephritis and provided antibiotics. Overall she is feeling great. Denies SOB/PND/Orthopnea/dizziness.  Able to walk up inclined areas.  Weight at SNF 129 pounds.  She can not pay for medications. Remains at [facility] SNF for now.   05/06/13 RHC/LHC  RA 7  RV  PA 31/19 (24)  PCWP 10  PA Sat 53  Fick CO 2.90 CI 1.74  Thermo CO 2.4 CI 1.4  LHC---> chronically occluded LAD, moderate disease in a large OM 2 and severe disease and small posterior lateral vessel  Labs 05/15/13 K 3.7 Creatinine 0.97 Labs 06/02/13 K 3.9 Creatinine 1.0 Dig level 1.3 --> cut back dig to 0.0625 mg Labs 06/08/13 K 4.7 Creatinine 1.1 Dig level 0.4   ROS: All systems negative except as listed in HPI, PMH and Problem List.  Past Medical History  Diagnosis Date  . Diverticulosis   . Pancreatitis   . Hypertension   . Stroke 2011    Mild left weakness  . Hyperlipidemia   . Anxiety   . GERD (gastroesophageal reflux disease)   . ETOH abuse    . CHF (congestive heart failure)     EF 25%    Current Outpatient Prescriptions  Medication Sig Dispense Refill  . acetaminophen (TYLENOL) 325 MG tablet Take 650 mg by mouth every 4 (four) hours as needed for mild pain.      Marland Kitchen amiodarone (PACERONE) 200 MG tablet Take 200 mg by mouth 2 (two) times daily.      Marland Kitchen aspirin 81 MG chewable tablet Chew 1 tablet (81 mg total) by mouth daily.      Marland Kitchen atorvastatin (LIPITOR) 40 MG tablet Take 1 tablet (40 mg total) by mouth daily at 6 PM.      . bisacodyl (DULCOLAX) 5 MG EC tablet Take 2 tablets (10 mg total) by mouth daily as needed for moderate constipation.  30 tablet  0  . carvedilol (COREG) 3.125 MG tablet Take 1 tablet (3.125 mg total) by mouth 2 (two) times daily with a meal.  60 tablet  6  . digoxin (LANOXIN) 0.125 MG tablet Take 0.0625 mg by mouth daily.      . folic acid (FOLVITE) 1 MG tablet Take 1 tablet (1 mg total) by mouth daily.      . furosemide (LASIX) 20 MG tablet Take 1 tablet (20 mg total) by mouth every other day.  30 tablet    .  levalbuterol (XOPENEX) 0.63 MG/3ML nebulizer solution Take 3 mLs (0.63 mg total) by nebulization every 6 (six) hours as needed for wheezing or shortness of breath.  3 mL  12  . lisinopril (PRINIVIL,ZESTRIL) 5 MG tablet Take 1 tablet (5 mg total) by mouth at bedtime.      Marland Kitchen LORazepam (ATIVAN) 0.5 MG tablet Take 0.25 mg by mouth 2 (two) times daily as needed for anxiety.      . multivitamin-iron-minerals-folic acid (THERAPEUTIC-M) TABS tablet Take 1 tablet by mouth daily.      . promethazine (PHENERGAN) 25 MG tablet Take 25 mg by mouth every 6 (six) hours as needed for nausea or vomiting.      . rivaroxaban (XARELTO) 20 MG TABS tablet Take 1 tablet (20 mg total) by mouth daily with supper.  30 tablet    . spironolactone (ALDACTONE) 12.5 mg TABS tablet Take 0.5 tablets (12.5 mg total) by mouth daily.      Marland Kitchen thiamine 100 MG tablet Take 1 tablet (100 mg total) by mouth daily.       No current  facility-administered medications for this encounter.     PHYSICAL EXAM: Filed Vitals:   06/30/13 1109  BP: 102/64  Pulse: 66  Resp: 16  Weight: 130 lb 8 oz (59.194 kg)  SpO2: 100%    General:  Well appearing. No resp difficulty. Ambulated in the clinic with no difficulties.  HEENT: normal Neck: supple. JVP flat. Carotids 2+ bilaterally; no bruits. No lymphadenopathy or thryomegaly appreciated. Cor: PMI normal. Regular rate & rhythm. No rubs, gallops or murmurs. Lungs: clear Abdomen: soft, nontender, nondistended. No hepatosplenomegaly. No bruits or masses. Good bowel sounds. Extremities: no cyanosis, clubbing, rash, edema Neuro: alert & orientedx3, cranial nerves grossly intact. Moves all 4 extremities w/o difficulty. Affect pleasant.    ASSESSMENT & PLAN:   1. Chronic Systolic Heart Failure: Primarily  ICM though ETOH may play a role.  ECHO 05/08/13  EF 20-25%,  She had low output on RHC.    Overall she continues to improve. doing great. NYHA II. Volume status stable.  Continue lasix 20 mg qod and spironolactone 12.5 mg daily.  - Continuied carvedilol 3.125 mg in am and increase to 6.25mg  at bed time.  Continue digoxin 0.125 mg daily. Dig level 0.4  - Continue lisinopril 5 mg at bed time. Dizziness resolved. .  - She will need to stay off ETOH as this certainly may have played a role in her cardiomyopathy.  - Reinforced daily weights, low salt food choices, and limiting fluid intake to < 2 liters per day,  - Plan to repeat ECHO within 3 months after HF meds optimized. Should be around the end of July.    -HF SW following for assistance with disposition and rehab.  2. CAD: LHC with chronically occluded LAD, moderate disease in a large OM 2 and severe disease and small posterior lateral vessel. She did not have good targets for revascularization.No chest pain.  Continue aspirin and statin.     Check lipids at SNF.  3. Former Smoker: Encouraged to remain off tobacco.  4. Acute  pancreatitis: Due to ETOH, now resolved.   5. Social Issues: Plan to d/c to [facility] after she completes rehab. She will need to be followed closely in HF clinic.  HF SW to meet with [name] today.  6. Former Alcohol Abuse- Needs to remain completely off alcohol.  7. PAF- Regular rate and rhythm today. Cut back amiodarone to 200 mg  daily.  On Xarelto 20 mg daily. SNF to check TSH, LFTs tomorrow and fax results.    Follow up in 2 weeks.      Marland Kitchen CLEGG,AMY NP-C  11:34 AM

## 2013-06-30 ENCOUNTER — Encounter (HOSPITAL_COMMUNITY): Payer: Self-pay

## 2013-06-30 ENCOUNTER — Encounter: Payer: Self-pay | Admitting: Licensed Clinical Social Worker

## 2013-06-30 ENCOUNTER — Ambulatory Visit (HOSPITAL_COMMUNITY)
Admission: RE | Admit: 2013-06-30 | Discharge: 2013-06-30 | Disposition: A | Discharge status: Home / Self Care | Payer: Medicaid Other | Source: Ambulatory Visit | Attending: Internal Medicine | Admitting: Internal Medicine

## 2013-06-30 VITALS — BP 102/64 | HR 66 | Resp 16 | Wt 130.5 lb

## 2013-06-30 DIAGNOSIS — Z8673 Personal history of transient ischemic attack (TIA), and cerebral infarction without residual deficits: Secondary | ICD-10-CM | POA: Insufficient documentation

## 2013-06-30 DIAGNOSIS — I10 Essential (primary) hypertension: Secondary | ICD-10-CM | POA: Insufficient documentation

## 2013-06-30 DIAGNOSIS — I509 Heart failure, unspecified: Secondary | ICD-10-CM

## 2013-06-30 DIAGNOSIS — I2510 Atherosclerotic heart disease of native coronary artery without angina pectoris: Secondary | ICD-10-CM | POA: Diagnosis not present

## 2013-06-30 DIAGNOSIS — F1010 Alcohol abuse, uncomplicated: Secondary | ICD-10-CM | POA: Insufficient documentation

## 2013-06-30 DIAGNOSIS — F411 Generalized anxiety disorder: Secondary | ICD-10-CM | POA: Diagnosis not present

## 2013-06-30 DIAGNOSIS — Z7982 Long term (current) use of aspirin: Secondary | ICD-10-CM | POA: Insufficient documentation

## 2013-06-30 DIAGNOSIS — Z7901 Long term (current) use of anticoagulants: Secondary | ICD-10-CM | POA: Insufficient documentation

## 2013-06-30 DIAGNOSIS — E785 Hyperlipidemia, unspecified: Secondary | ICD-10-CM | POA: Insufficient documentation

## 2013-06-30 DIAGNOSIS — I4891 Unspecified atrial fibrillation: Secondary | ICD-10-CM | POA: Diagnosis not present

## 2013-06-30 DIAGNOSIS — I5022 Chronic systolic (congestive) heart failure: Secondary | ICD-10-CM | POA: Diagnosis present

## 2013-06-30 DIAGNOSIS — Z79899 Other long term (current) drug therapy: Secondary | ICD-10-CM | POA: Diagnosis not present

## 2013-06-30 DIAGNOSIS — F17200 Nicotine dependence, unspecified, uncomplicated: Secondary | ICD-10-CM

## 2013-06-30 DIAGNOSIS — I5032 Chronic diastolic (congestive) heart failure: Secondary | ICD-10-CM | POA: Diagnosis not present

## 2013-06-30 DIAGNOSIS — Z87891 Personal history of nicotine dependence: Secondary | ICD-10-CM | POA: Diagnosis not present

## 2013-06-30 DIAGNOSIS — K219 Gastro-esophageal reflux disease without esophagitis: Secondary | ICD-10-CM | POA: Insufficient documentation

## 2013-06-30 DIAGNOSIS — Z720 Tobacco use: Secondary | ICD-10-CM

## 2013-06-30 MED ORDER — AMIODARONE HCL 200 MG PO TABS: 200.0000 mg | ORAL_TABLET | Freq: Every day | ORAL | Status: DC

## 2013-06-30 MED ORDER — CARVEDILOL 3.125 MG PO TABS
ORAL_TABLET | ORAL | Status: DC
Start: 2013-06-30 — End: 2013-08-05

## 2013-07-01 NOTE — Progress Notes (Signed)
CSW met with patient in the clinic. Patient reports that she received notification that her medicaid application was submitted for "regular medicaid and LTC". Patient requested assistance with follow up. CSW contacted Michele Martinique from financial counseling who assisted and will follow up with medicaid worker to determine further needs. CSW informed patient of follow up and will continue to be available should further assistance be needed. Raquel Sarna, [place]

## 2013-07-15 ENCOUNTER — Encounter (HOSPITAL_COMMUNITY): Payer: Self-pay

## 2013-07-22 ENCOUNTER — Inpatient Hospital Stay (HOSPITAL_COMMUNITY): Admission: RE | Admit: 2013-07-22 | Payer: Self-pay | Source: Ambulatory Visit

## 2013-07-22 ENCOUNTER — Encounter (HOSPITAL_COMMUNITY): Payer: Self-pay

## 2013-07-22 HISTORY — DX: Ischemic cardiomyopathy: I25.5

## 2013-07-22 HISTORY — DX: Atherosclerotic heart disease of native coronary artery without angina pectoris: I25.10

## 2013-08-04 NOTE — Progress Notes (Signed)
Patient ID: [name], female   DOB: 08-24-1955, 58 y.o.   MRN: [id number]  PCP: N/A Primary Cardiologist: Dr. Percival Spanish  HPI: [name] is a 58 y/o female with a history of ETOH abuse, ICM, HTN, stroke and chronic systolic heart failure.  Admitted to [facility] 05/06/13 for pancreatitis. She had severely reduced systolic function with an EF of 20-25%. 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 Discharge weight was 140 pounds.   Admitted to [facility] 05/2013 with A fib RVR. Started on amiodarone 400 mg tiwce a day and Xarelto 20 mg daily. Chemically converted to NSR.   Follow up for Heart Failure: Last visit increased coreg to 3.125 mg q am and 6.25 mg q pm. Has been out of medications for about 1 week. Overall feeling pretty good other than leg cramps. Denies SOB, edema, orthopnea or PND. Denies CP. Living at [facility]. Back with her husband who is now not drinking either. She still has been abstinant from ETOH and tobacco. Weight at home 131 lbs. Can walk around the whole grocery store without stopping. Difficulty with inclines.    Labs 05/15/13 K 3.7 Creatinine 0.97 Labs 06/02/13 K 3.9 Creatinine 1.0 Dig level 1.3 --> cut back dig to 0.0625 mg Labs 06/08/13 K 4.7 Creatinine 1.1 Dig level 0.4  Labs 06/19/13: K 4.1, creatinine 1.35  ROS: All systems negative except as listed in HPI, PMH and Problem List.  Past Medical History  Diagnosis Date  . Diverticulosis   . Pancreatitis   . Hypertension   . Stroke 2011  . Hyperlipidemia   . Anxiety   . GERD (gastroesophageal reflux disease)   . ETOH abuse   . chronic systolic heart failure     a. ECHO (05/08/13): EF 20-25%, diff HK with akinesis of basal inferior wall, grade 1 DD, trivial MR, trivial TR b) RHC (05/06/13): RA 7, RV 30/2, PA 31/19 (24), PCWP 10, Fick CO/CI: 2.9/1.74, Thermo CO/Ci: 2.4/1.4, PA sat 53%  . Ischemic cardiomyopathy   . CAD (coronary artery  disease)     a. LHC (04/2013): Chronically occluded LAD, moderate dz in large OM1 and severe dz and small posterior lateral vessel    . A-fib     Current Outpatient Prescriptions  Medication Sig Dispense Refill  . acetaminophen (TYLENOL) 325 MG tablet Take 650 mg by mouth every 4 (four) hours as needed for mild pain.      Marland Kitchen amiodarone (PACERONE) 200 MG tablet Take 1 tablet (200 mg total) by mouth daily.  30 tablet  6  . aspirin 81 MG chewable tablet Chew 1 tablet (81 mg total) by mouth daily.      Marland Kitchen atorvastatin (LIPITOR) 40 MG tablet Take 1 tablet (40 mg total) by mouth daily at 6 PM.      . bisacodyl (DULCOLAX) 5 MG EC tablet Take 2 tablets (10 mg total) by mouth daily as needed for moderate constipation.  30 tablet  0  . carvedilol (COREG) 3.125 MG tablet Take by mouth. Take 2 tablet (6.25 mg) in am and 1 tablet (3.125 mg) in pm.  Hold if HR < 60      . digoxin (LANOXIN) 0.125 MG tablet Take 0.0625 mg by mouth daily.      . folic acid (FOLVITE) 1 MG tablet Take 1 tablet (1 mg total) by mouth daily.      . furosemide (LASIX) 20 MG tablet Take  1 tablet (20 mg total) by mouth every other day.  30 tablet    . levalbuterol (XOPENEX) 0.63 MG/3ML nebulizer solution Take 3 mLs (0.63 mg total) by nebulization every 6 (six) hours as needed for wheezing or shortness of breath.  3 mL  12  . levothyroxine (SYNTHROID, LEVOTHROID) 25 MCG tablet Take 12.5 mcg by mouth daily before breakfast.      . lisinopril (PRINIVIL,ZESTRIL) 5 MG tablet Take 1 tablet (5 mg total) by mouth at bedtime.      Marland Kitchen LORazepam (ATIVAN) 0.5 MG tablet Take 0.25 mg by mouth 2 (two) times daily as needed for anxiety.      . multivitamin-iron-minerals-folic acid (THERAPEUTIC-M) TABS tablet Take 1 tablet by mouth daily.      . promethazine (PHENERGAN) 25 MG tablet Take 25 mg by mouth every 6 (six) hours as needed for nausea or vomiting.      . rivaroxaban (XARELTO) 20 MG TABS tablet Take 1 tablet (20 mg total) by mouth daily with supper.   30 tablet    . spironolactone (ALDACTONE) 12.5 mg TABS tablet Take 0.5 tablets (12.5 mg total) by mouth daily.      Marland Kitchen thiamine 100 MG tablet Take 1 tablet (100 mg total) by mouth daily.       No current facility-administered medications for this encounter.    Filed Vitals:   08/05/13 0952  BP: 131/66  Pulse: 75  Resp: 18  Weight: 131 lb 2 oz (59.478 kg)  SpO2: 99%    PHYSICAL EXAM: General:  Well appearing. No resp difficulty. Ambulated in the clinic with no difficulties.  HEENT: normal Neck: supple. JVP flat. Carotids 2+ bilaterally; no bruits. No lymphadenopathy or thryomegaly appreciated. Cor: PMI normal. Regular rate & rhythm. No rubs, gallops or murmurs. Lungs: clear Abdomen: soft, nontender, nondistended. No hepatosplenomegaly. No bruits or masses. Good bowel sounds. Extremities: no cyanosis, clubbing, rash, edema Neuro: alert & orientedx3, cranial nerves grossly intact. Moves all 4 extremities w/o difficulty. Affect pleasant.  ASSESSMENT & PLAN:  1. Chronic Systolic Heart Failure: Primarily  ICM though ETOH may play a role.  EF 20-25% (04/2013) - NYHA II symptoms and volume status stable. She has been out of all her medications for a week. Will restart lasix 20 mg QOD. Discussed the use of sliding scale diuretics. - Will restart all meds: coreg 3.125 mg q am and 6.25 mg q pm, spironolactone 12.5 mg daily, digoxin 0.0625 mg daily and lisinopril 5 mg daily. Provided meds thorough HF fund. Check BMET today and in 7-10 days. Call any dizziness. - Congratulated her on continued abstinence from ETOH. - Will eventually need repeat ECHO but would like her to be back on all of her meds. - Reinforced the need and importance of daily weights, a low sodium diet, and fluid restriction (less than 2 L a day). Instructed to call the HF clinic if weight increases more than 3 lbs overnight or 5 lbs in a week.   2. CAD: LHC with chronically occluded LAD, moderate disease in a large OM 2 and  severe disease and small posterior lateral vessel. She did not have good targets for revascularization. No chest pain. Continue ASA, statin and BB. 3. Former Smoker: Encouraged to remain off tobacco.   4. Former Alcohol Abuse- Needs to remain completely off alcohol which she has. 5.  PAF- Appears to be in NSR today. Will not restart amiodarone. Restart Xarelto 20 mg daily and provided her with samples. She is working  on getting Medicaid.  Junie Bame B NP-C 12:40 PM

## 2013-08-05 ENCOUNTER — Encounter: Payer: Self-pay | Admitting: Licensed Clinical Social Worker

## 2013-08-05 ENCOUNTER — Encounter (HOSPITAL_COMMUNITY): Payer: Self-pay

## 2013-08-05 ENCOUNTER — Ambulatory Visit (HOSPITAL_COMMUNITY)
Admission: RE | Admit: 2013-08-05 | Discharge: 2013-08-05 | Disposition: A | Discharge status: Home / Self Care | Payer: Medicaid Other | Source: Ambulatory Visit | Attending: Cardiology | Admitting: Cardiology

## 2013-08-05 VITALS — BP 131/66 | HR 75 | Resp 18 | Wt 131.1 lb

## 2013-08-05 DIAGNOSIS — I480 Paroxysmal atrial fibrillation: Secondary | ICD-10-CM

## 2013-08-05 DIAGNOSIS — I509 Heart failure, unspecified: Secondary | ICD-10-CM | POA: Diagnosis not present

## 2013-08-05 DIAGNOSIS — I2589 Other forms of chronic ischemic heart disease: Secondary | ICD-10-CM | POA: Insufficient documentation

## 2013-08-05 DIAGNOSIS — K5730 Diverticulosis of large intestine without perforation or abscess without bleeding: Secondary | ICD-10-CM | POA: Diagnosis not present

## 2013-08-05 DIAGNOSIS — F411 Generalized anxiety disorder: Secondary | ICD-10-CM | POA: Diagnosis not present

## 2013-08-05 DIAGNOSIS — F17200 Nicotine dependence, unspecified, uncomplicated: Secondary | ICD-10-CM

## 2013-08-05 DIAGNOSIS — K219 Gastro-esophageal reflux disease without esophagitis: Secondary | ICD-10-CM | POA: Diagnosis not present

## 2013-08-05 DIAGNOSIS — K859 Acute pancreatitis without necrosis or infection, unspecified: Secondary | ICD-10-CM | POA: Insufficient documentation

## 2013-08-05 DIAGNOSIS — Z87891 Personal history of nicotine dependence: Secondary | ICD-10-CM | POA: Diagnosis not present

## 2013-08-05 DIAGNOSIS — F1010 Alcohol abuse, uncomplicated: Secondary | ICD-10-CM

## 2013-08-05 DIAGNOSIS — I4891 Unspecified atrial fibrillation: Secondary | ICD-10-CM

## 2013-08-05 DIAGNOSIS — I5022 Chronic systolic (congestive) heart failure: Secondary | ICD-10-CM | POA: Insufficient documentation

## 2013-08-05 DIAGNOSIS — Z7982 Long term (current) use of aspirin: Secondary | ICD-10-CM | POA: Diagnosis not present

## 2013-08-05 DIAGNOSIS — I10 Essential (primary) hypertension: Secondary | ICD-10-CM

## 2013-08-05 DIAGNOSIS — F1021 Alcohol dependence, in remission: Secondary | ICD-10-CM | POA: Insufficient documentation

## 2013-08-05 DIAGNOSIS — E785 Hyperlipidemia, unspecified: Secondary | ICD-10-CM | POA: Diagnosis not present

## 2013-08-05 DIAGNOSIS — Z8673 Personal history of transient ischemic attack (TIA), and cerebral infarction without residual deficits: Secondary | ICD-10-CM | POA: Diagnosis not present

## 2013-08-05 DIAGNOSIS — Z720 Tobacco use: Secondary | ICD-10-CM

## 2013-08-05 LAB — COMPREHENSIVE METABOLIC PANEL
ALT: 28 U/L (ref 0–35)
AST: 25 U/L (ref 0–37)
Albumin: 3.9 g/dL (ref 3.5–5.2)
Alkaline Phosphatase: 101 U/L (ref 39–117)
Anion gap: 17 — ABNORMAL HIGH (ref 5–15)
BUN: 17 mg/dL (ref 6–23)
CO2: 22 meq/L (ref 19–32)
Calcium: 10 mg/dL (ref 8.4–10.5)
Chloride: 104 mEq/L (ref 96–112)
Creatinine, Ser: 1.17 mg/dL — ABNORMAL HIGH (ref 0.50–1.10)
GFR, EST AFRICAN AMERICAN: 58 mL/min — AB (ref >90)
GFR, EST NON AFRICAN AMERICAN: 50 mL/min — AB (ref >90)
GLUCOSE: 103 mg/dL — AB (ref 70–99)
POTASSIUM: 4.5 meq/L (ref 3.7–5.3)
SODIUM: 143 meq/L (ref 137–147)
Total Bilirubin: 0.3 mg/dL (ref 0.3–1.2)
Total Protein: 7.7 g/dL (ref 6.0–8.3)

## 2013-08-05 LAB — LIPID PANEL
Cholesterol: 188 mg/dL (ref 0–200)
HDL: 41 mg/dL (ref >39)
LDL CALC: 110 mg/dL — AB (ref 0–99)
Total CHOL/HDL Ratio: 4.6 RATIO
Triglycerides: 185 mg/dL — ABNORMAL HIGH (ref <150)
VLDL: 37 mg/dL (ref 0–40)

## 2013-08-05 MED ORDER — FUROSEMIDE 20 MG PO TABS: 20.0000 mg | ORAL_TABLET | ORAL | Status: DC

## 2013-08-05 MED ORDER — DIGOXIN 125 MCG PO TABS: 0.0625 mg | ORAL_TABLET | Freq: Every day | ORAL | Status: DC

## 2013-08-05 MED ORDER — ATORVASTATIN CALCIUM 40 MG PO TABS: 40.0000 mg | ORAL_TABLET | Freq: Every day | ORAL | Status: DC

## 2013-08-05 MED ORDER — SPIRONOLACTONE 25 MG PO TABS: 12.5000 mg | ORAL_TABLET | Freq: Every day | ORAL | Status: DC

## 2013-08-05 MED ORDER — LISINOPRIL 5 MG PO TABS: 5.0000 mg | ORAL_TABLET | Freq: Every day | ORAL | Status: DC

## 2013-08-05 MED ORDER — CARVEDILOL 3.125 MG PO TABS: ORAL_TABLET | ORAL | Status: DC

## 2013-08-05 NOTE — Patient Instructions (Signed)
Start taking all your medications again.  Coreg 3.125 mg (1 tablet) in am and 6.25 mg (2 tablets) in pm. Lisinopril 5 mg (1 tablet) nightly Lasix 20 mg (1 tablet) every other day Spironolactone 12.5 (1/2 tablet) daily Digoxin 0.0625 mg (1/2 tablet) daily Atorvastatin 40 mg daily Xarelto 20 mg daily  Follow up in 2 weeks  Do the following things EVERYDAY: 1) Weigh yourself in the morning before breakfast. Write it down and keep it in a log. 2) Take your medicines as prescribed 3) Eat low salt foods-Limit salt (sodium) to 2000 mg per day.  4) Stay as active as you can everyday 5) Limit all fluids for the day to less than 2 liters 6)

## 2013-08-05 NOTE — Progress Notes (Signed)
CSW referred to assist patient with follow up on medicaid application. CSW contacted Mrs.Gilliard [phone] medicaid worker to follow up on medicaid status. CSW informed that patient's application is still pending as awaiting disability review to be completed. Medicaid worker aware of patient's home address and will contact patient if further needs arise. CSW provided support and explanation to patient about medicaid process and application status. Patient verbalizes understanding and will call CSW if further needs arise. Raquel Sarna, [place]

## 2013-08-17 ENCOUNTER — Ambulatory Visit (HOSPITAL_COMMUNITY)
Admission: RE | Admit: 2013-08-17 | Discharge: 2013-08-17 | Disposition: A | Discharge status: Home / Self Care | Payer: Medicaid Other | Source: Ambulatory Visit | Attending: Internal Medicine | Admitting: Internal Medicine

## 2013-08-17 ENCOUNTER — Encounter: Payer: Self-pay | Admitting: Licensed Clinical Social Worker

## 2013-08-17 ENCOUNTER — Encounter (HOSPITAL_COMMUNITY): Payer: Self-pay

## 2013-08-17 VITALS — BP 99/54 | HR 63 | Resp 18 | Wt 126.2 lb

## 2013-08-17 DIAGNOSIS — I480 Paroxysmal atrial fibrillation: Secondary | ICD-10-CM

## 2013-08-17 DIAGNOSIS — I4891 Unspecified atrial fibrillation: Secondary | ICD-10-CM

## 2013-08-17 DIAGNOSIS — I509 Heart failure, unspecified: Secondary | ICD-10-CM | POA: Diagnosis not present

## 2013-08-17 DIAGNOSIS — I5022 Chronic systolic (congestive) heart failure: Secondary | ICD-10-CM | POA: Diagnosis not present

## 2013-08-17 DIAGNOSIS — F17200 Nicotine dependence, unspecified, uncomplicated: Secondary | ICD-10-CM

## 2013-08-17 DIAGNOSIS — Z720 Tobacco use: Secondary | ICD-10-CM

## 2013-08-17 DIAGNOSIS — F1010 Alcohol abuse, uncomplicated: Secondary | ICD-10-CM

## 2013-08-17 LAB — BASIC METABOLIC PANEL
ANION GAP: 18 — AB (ref 5–15)
BUN: 33 mg/dL — AB (ref 6–23)
CALCIUM: 9.7 mg/dL (ref 8.4–10.5)
CO2: 25 mEq/L (ref 19–32)
Chloride: 99 mEq/L (ref 96–112)
Creatinine, Ser: 2.04 mg/dL — ABNORMAL HIGH (ref 0.50–1.10)
GFR calc non Af Amer: 26 mL/min — ABNORMAL LOW (ref >90)
GFR, EST AFRICAN AMERICAN: 30 mL/min — AB (ref >90)
Glucose, Bld: 107 mg/dL — ABNORMAL HIGH (ref 70–99)
Potassium: 4.9 mEq/L (ref 3.7–5.3)
Sodium: 142 mEq/L (ref 137–147)

## 2013-08-17 LAB — PRO B NATRIURETIC PEPTIDE: Pro B Natriuretic peptide (BNP): 789 pg/mL — ABNORMAL HIGH (ref 0–125)

## 2013-08-17 MED ORDER — CARVEDILOL 3.125 MG PO TABS: ORAL_TABLET | ORAL | Status: DC

## 2013-08-17 MED ORDER — LISINOPRIL 5 MG PO TABS: 5.0000 mg | ORAL_TABLET | Freq: Every day | ORAL | Status: DC

## 2013-08-17 MED ORDER — LEVOTHYROXINE SODIUM 25 MCG PO TABS: 12.5000 ug | ORAL_TABLET | Freq: Every day | ORAL | Status: DC

## 2013-08-17 MED ORDER — ATORVASTATIN CALCIUM 80 MG PO TABS
40.0000 mg | ORAL_TABLET | Freq: Every day | ORAL | Status: DC
Start: 2013-08-17 — End: 2013-10-18

## 2013-08-17 MED ORDER — DIGOXIN 125 MCG PO TABS: 0.0625 mg | ORAL_TABLET | Freq: Every day | ORAL | Status: DC

## 2013-08-17 MED ORDER — FUROSEMIDE 20 MG PO TABS
20.0000 mg | ORAL_TABLET | ORAL | Status: DC
Start: 2013-08-17 — End: 2013-08-30

## 2013-08-17 MED ORDER — RIVAROXABAN 20 MG PO TABS: 20.0000 mg | ORAL_TABLET | Freq: Every day | ORAL | Status: DC

## 2013-08-17 MED ORDER — SPIRONOLACTONE 25 MG PO TABS: 12.5000 mg | ORAL_TABLET | Freq: Every day | ORAL | Status: DC

## 2013-08-17 NOTE — Progress Notes (Signed)
CSW met with patient in the clinic to discuss medicaid and PCP needs. CSW informed patient that she was approved for medicaid and should be receiving a medicaid card in the mail shortly. CSW discussed auto assignment of PCP through [employer]. CSW explained that PCP will be listed on medicaid card and how to follow up for PCP appointment. Patient verbalized understanding and will follow up with CSW if further assistance needed in contacting PCP office once card received. [name], [place]

## 2013-08-17 NOTE — Progress Notes (Signed)
.Patient ID: [name], female   DOB: 04/02/1955, 58 y.o.   MRN: [id number]  PCP: N/A Primary Cardiologist: Dr. Percival Spanish  HPI: [name] is a 58 y/o female with a history of ETOH abuse, ICM, HTN, stroke and chronic systolic heart failure.  Admitted to [facility] 05/06/13 for pancreatitis. She had severely reduced systolic function with an EF of 20-25%. 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 Discharge weight was 140 pounds.   Admitted to [facility] 05/2013 with A fib RVR. Started on amiodarone 400 mg tiwce a day and Xarelto 20 mg daily. Chemically converted to NSR.   Follow up for Heart Failure: Last visit restarted all medications. Doing well. Still reports feeling nauseated since she got over cold. Denies SOB, edema, orthopnea or PND. Denies CP. Still living at [facility]. She still has been abstinant from ETOH and tobacco. Weight at home 125 lbs. Can walk around the whole grocery store without stopping.   Labs 05/15/13 K 3.7 Creatinine 0.97 Labs 06/02/13 K 3.9 Creatinine 1.0 Dig level 1.3 --> cut back dig to 0.0625 mg Labs 06/08/13 K 4.7 Creatinine 1.1 Dig level 0.4  Labs 06/19/13: K 4.1, creatinine 1.35 Labs 08/05/13: K 4.5, creatinine 1.17, AST 25, ALT 28, cholesterol 188, TG 185, HDL 41, LDL 110  ROS: All systems negative except as listed in HPI, PMH and Problem List.  Past Medical History  Diagnosis Date  . Diverticulosis   . Pancreatitis   . Hypertension   . Stroke 2011  . Hyperlipidemia   . Anxiety   . GERD (gastroesophageal reflux disease)   . ETOH abuse   . chronic systolic heart failure     a. ECHO (05/08/13): EF 20-25%, diff HK with akinesis of basal inferior wall, grade 1 DD, trivial MR, trivial TR b) RHC (05/06/13): RA 7, RV 30/2, PA 31/19 (24), PCWP 10, Fick CO/CI: 2.9/1.74, Thermo CO/Ci: 2.4/1.4, PA sat 53%  . Ischemic cardiomyopathy   . CAD (coronary artery disease)     a. LHC (04/2013):  Chronically occluded LAD, moderate dz in large OM1 and severe dz and small posterior lateral vessel    . A-fib     Current Outpatient Prescriptions  Medication Sig Dispense Refill  . acetaminophen (TYLENOL) 325 MG tablet Take 650 mg by mouth every 4 (four) hours as needed for mild pain.      Marland Kitchen aspirin 81 MG chewable tablet Chew 1 tablet (81 mg total) by mouth daily.      Marland Kitchen atorvastatin (LIPITOR) 40 MG tablet Take 1 tablet (40 mg total) by mouth daily at 6 PM.  30 tablet  3  . carvedilol (COREG) 3.125 MG tablet Take 1 tablet (3.125 mg) in am and 2 tablets (6.25 mg) in pm.  Hold if HR < 60  90 tablet  3  . digoxin (LANOXIN) 0.125 MG tablet Take 0.5 tablets (0.0625 mg total) by mouth daily.  15 tablet  3  . furosemide (LASIX) 20 MG tablet Take 1 tablet (20 mg total) by mouth every other day.  15 tablet  3  . lisinopril (PRINIVIL,ZESTRIL) 5 MG tablet Take 1 tablet (5 mg total) by mouth at bedtime.  30 tablet  3  . rivaroxaban (XARELTO) 20 MG TABS tablet Take 1 tablet (20 mg total) by mouth daily with supper.  30 tablet    . spironolactone (ALDACTONE) 25 MG tablet Take 0.5 tablets (12.5 mg total) by mouth  daily.  15 tablet  3  . levothyroxine (SYNTHROID, LEVOTHROID) 25 MCG tablet Take 12.5 mcg by mouth daily before breakfast.       No current facility-administered medications for this encounter.    Filed Vitals:   08/17/13 0924  BP: 99/54  Pulse: 63  Resp: 18  Weight: 126 lb 4 oz (57.267 kg)  SpO2: 100%    PHYSICAL EXAM: General:  Well appearing. No resp difficulty. Ambulated in the clinic with no difficulties.  HEENT: normal Neck: supple. JVP flat. Carotids 2+ bilaterally; no bruits. No lymphadenopathy or thryomegaly appreciated. Cor: PMI normal. Regular rate & rhythm. No rubs, gallops or murmurs. Lungs: clear Abdomen: soft, nontender, nondistended. No hepatosplenomegaly. No bruits or masses. Good bowel sounds. Extremities: no cyanosis, clubbing, rash, edema Neuro: alert &  orientedx3, cranial nerves grossly intact. Moves all 4 extremities w/o difficulty. Affect pleasant.  ASSESSMENT & PLAN:  1. Chronic Systolic Heart Failure: Primarily ICM thought ETOH may play a role.  EF 20-25% (04/2013) - NYHA II symptoms and volume status stable. Weight is down about 7 lbs since last visit. Will check BMET and pro-BNP and if down will change lasix to PRN.  - Continue coreg 3.125 mg q am and 6.25 mg q pm. - Continue spiro, lisinopril and digoxin at current doses. Needs digoxin check next clinic, just restarted. - Will eventually need repeat ECHO but since she was out of meds for awhile would prefer a few months on OMM before repeating.  - Reinforced the need and importance of daily weights, a low sodium diet, and fluid restriction (less than 2 L a day). Instructed to call the HF clinic if weight increases more than 3 lbs overnight or 5 lbs in a week.   2. CAD: LHC with chronically occluded LAD, moderate disease in a large OM 2 and severe disease and small posterior lateral vessel. She did not have good targets for revascularization. No chest pain. Continue ASA, statin and BB. Last LDL elevated will increase atorvastatin to 80 mg daily. Repeat LFTs and Lipids in 2 months. 3. Former Smoker: Continues to abstain. Encouraged to remain off tobacco.   4. Former Alcohol Abuse- Continues to not drink and congratulated patient on this success.  5.  PAF- Appears to be in NSR today. Continue Xarelto 20 mg daily.    Patient now has Medicaid and sent all meds to [facility]. Waiting on Medicaid card and once she gets will need to be set up for PCP.   F/U 1 month Junie Bame B NP-C 9:31 AM

## 2013-08-17 NOTE — Patient Instructions (Signed)
Doing great.  Sent all medications in to [facility]. They will probably call you to let you know they are there. You don't have to pick up the medications you currently have. Call me with any issues or if you can't afford them.  Will call about lab results and may change your lasix.  Increase your atorvastatin to 80 mg (2 tablets) nightly. When you get your new refill it will be 80 mg tablets so then you then will take 1 tablet daily.   Will follow up in 1 month.  Do the following things EVERYDAY: 1) Weigh yourself in the morning before breakfast. Write it down and keep it in a log. 2) Take your medicines as prescribed 3) Eat low salt foods-Limit salt (sodium) to 2000 mg per day.  4) Stay as active as you can everyday 5) Limit all fluids for the day to less than 2 liters 6)

## 2013-08-18 ENCOUNTER — Telehealth (HOSPITAL_COMMUNITY): Payer: Self-pay

## 2013-08-18 NOTE — Telephone Encounter (Signed)
Lab resutls reviewed with patient.  Instructed to stop lasix and hold lisinopril x 2 days.  Will come back for lab repeat next Thursday.  Aware and agreeable. Renee Pain

## 2013-08-26 ENCOUNTER — Ambulatory Visit (HOSPITAL_COMMUNITY)
Admission: RE | Admit: 2013-08-26 | Discharge: 2013-08-26 | Disposition: A | Discharge status: Home / Self Care | Payer: Medicaid Other | Source: Ambulatory Visit | Attending: Cardiology | Admitting: Cardiology

## 2013-08-26 DIAGNOSIS — I5022 Chronic systolic (congestive) heart failure: Secondary | ICD-10-CM

## 2013-08-26 DIAGNOSIS — I4891 Unspecified atrial fibrillation: Secondary | ICD-10-CM | POA: Insufficient documentation

## 2013-08-26 LAB — BASIC METABOLIC PANEL
Anion gap: 14 (ref 5–15)
BUN: 28 mg/dL — ABNORMAL HIGH (ref 6–23)
CHLORIDE: 104 meq/L (ref 96–112)
CO2: 24 mEq/L (ref 19–32)
CREATININE: 1.82 mg/dL — AB (ref 0.50–1.10)
Calcium: 9.8 mg/dL (ref 8.4–10.5)
GFR, EST AFRICAN AMERICAN: 34 mL/min — AB (ref >90)
GFR, EST NON AFRICAN AMERICAN: 30 mL/min — AB (ref >90)
Glucose, Bld: 98 mg/dL (ref 70–99)
POTASSIUM: 4.9 meq/L (ref 3.7–5.3)
Sodium: 142 mEq/L (ref 137–147)

## 2013-08-30 ENCOUNTER — Telehealth (HOSPITAL_COMMUNITY): Payer: Self-pay

## 2013-08-30 ENCOUNTER — Other Ambulatory Visit (HOSPITAL_COMMUNITY): Payer: Self-pay

## 2013-08-30 NOTE — Telephone Encounter (Signed)
Lab results reviewed with patient, instructed to stop lasix and spiro.  Aware and agreeable.

## 2013-09-02 ENCOUNTER — Encounter (HOSPITAL_COMMUNITY): Payer: Self-pay | Admitting: Emergency Medicine

## 2013-09-02 ENCOUNTER — Inpatient Hospital Stay (HOSPITAL_COMMUNITY)
Admission: EM | Admit: 2013-09-02 | Discharge: 2013-09-07 | DRG: 281 | Disposition: A | Discharge status: Home / Self Care | Payer: Medicaid Other | Attending: Cardiovascular Disease | Admitting: Cardiovascular Disease

## 2013-09-02 ENCOUNTER — Emergency Department (HOSPITAL_COMMUNITY): Payer: Medicaid Other

## 2013-09-02 DIAGNOSIS — F411 Generalized anxiety disorder: Secondary | ICD-10-CM | POA: Diagnosis present

## 2013-09-02 DIAGNOSIS — I5023 Acute on chronic systolic (congestive) heart failure: Secondary | ICD-10-CM | POA: Diagnosis present

## 2013-09-02 DIAGNOSIS — I214 Non-ST elevation (NSTEMI) myocardial infarction: Secondary | ICD-10-CM | POA: Diagnosis present

## 2013-09-02 DIAGNOSIS — N289 Disorder of kidney and ureter, unspecified: Secondary | ICD-10-CM | POA: Diagnosis present

## 2013-09-02 DIAGNOSIS — Z8249 Family history of ischemic heart disease and other diseases of the circulatory system: Secondary | ICD-10-CM | POA: Diagnosis not present

## 2013-09-02 DIAGNOSIS — Z7901 Long term (current) use of anticoagulants: Secondary | ICD-10-CM

## 2013-09-02 DIAGNOSIS — I5022 Chronic systolic (congestive) heart failure: Secondary | ICD-10-CM | POA: Diagnosis present

## 2013-09-02 DIAGNOSIS — Z87891 Personal history of nicotine dependence: Secondary | ICD-10-CM | POA: Diagnosis not present

## 2013-09-02 DIAGNOSIS — I255 Ischemic cardiomyopathy: Secondary | ICD-10-CM | POA: Diagnosis present

## 2013-09-02 DIAGNOSIS — K219 Gastro-esophageal reflux disease without esophagitis: Secondary | ICD-10-CM | POA: Diagnosis present

## 2013-09-02 DIAGNOSIS — R7989 Other specified abnormal findings of blood chemistry: Secondary | ICD-10-CM | POA: Diagnosis present

## 2013-09-02 DIAGNOSIS — F40298 Other specified phobia: Secondary | ICD-10-CM | POA: Diagnosis present

## 2013-09-02 DIAGNOSIS — D649 Anemia, unspecified: Secondary | ICD-10-CM | POA: Diagnosis present

## 2013-09-02 DIAGNOSIS — Z720 Tobacco use: Secondary | ICD-10-CM

## 2013-09-02 DIAGNOSIS — I200 Unstable angina: Secondary | ICD-10-CM | POA: Diagnosis present

## 2013-09-02 DIAGNOSIS — Z880 Allergy status to penicillin: Secondary | ICD-10-CM | POA: Diagnosis not present

## 2013-09-02 DIAGNOSIS — I2589 Other forms of chronic ischemic heart disease: Secondary | ICD-10-CM | POA: Diagnosis present

## 2013-09-02 DIAGNOSIS — E785 Hyperlipidemia, unspecified: Secondary | ICD-10-CM | POA: Diagnosis present

## 2013-09-02 DIAGNOSIS — Z7982 Long term (current) use of aspirin: Secondary | ICD-10-CM

## 2013-09-02 DIAGNOSIS — I5021 Acute systolic (congestive) heart failure: Secondary | ICD-10-CM

## 2013-09-02 DIAGNOSIS — I509 Heart failure, unspecified: Secondary | ICD-10-CM | POA: Diagnosis present

## 2013-09-02 DIAGNOSIS — I959 Hypotension, unspecified: Secondary | ICD-10-CM | POA: Diagnosis not present

## 2013-09-02 DIAGNOSIS — I700 Atherosclerosis of aorta: Secondary | ICD-10-CM | POA: Diagnosis present

## 2013-09-02 DIAGNOSIS — N182 Chronic kidney disease, stage 2 (mild): Secondary | ICD-10-CM | POA: Diagnosis present

## 2013-09-02 DIAGNOSIS — I25110 Atherosclerotic heart disease of native coronary artery with unstable angina pectoris: Secondary | ICD-10-CM

## 2013-09-02 DIAGNOSIS — I447 Left bundle-branch block, unspecified: Secondary | ICD-10-CM | POA: Diagnosis present

## 2013-09-02 DIAGNOSIS — F1011 Alcohol abuse, in remission: Secondary | ICD-10-CM | POA: Diagnosis present

## 2013-09-02 DIAGNOSIS — I4891 Unspecified atrial fibrillation: Secondary | ICD-10-CM | POA: Diagnosis present

## 2013-09-02 DIAGNOSIS — I2582 Chronic total occlusion of coronary artery: Secondary | ICD-10-CM | POA: Diagnosis present

## 2013-09-02 DIAGNOSIS — Z79899 Other long term (current) drug therapy: Secondary | ICD-10-CM

## 2013-09-02 DIAGNOSIS — I129 Hypertensive chronic kidney disease with stage 1 through stage 4 chronic kidney disease, or unspecified chronic kidney disease: Secondary | ICD-10-CM | POA: Diagnosis present

## 2013-09-02 DIAGNOSIS — F1010 Alcohol abuse, uncomplicated: Secondary | ICD-10-CM

## 2013-09-02 DIAGNOSIS — I69959 Hemiplegia and hemiparesis following unspecified cerebrovascular disease affecting unspecified side: Secondary | ICD-10-CM

## 2013-09-02 DIAGNOSIS — I10 Essential (primary) hypertension: Secondary | ICD-10-CM | POA: Diagnosis present

## 2013-09-02 DIAGNOSIS — R079 Chest pain, unspecified: Secondary | ICD-10-CM | POA: Insufficient documentation

## 2013-09-02 DIAGNOSIS — I480 Paroxysmal atrial fibrillation: Secondary | ICD-10-CM | POA: Diagnosis present

## 2013-09-02 DIAGNOSIS — N189 Chronic kidney disease, unspecified: Secondary | ICD-10-CM

## 2013-09-02 DIAGNOSIS — I2510 Atherosclerotic heart disease of native coronary artery without angina pectoris: Secondary | ICD-10-CM | POA: Diagnosis present

## 2013-09-02 DIAGNOSIS — R778 Other specified abnormalities of plasma proteins: Secondary | ICD-10-CM | POA: Diagnosis present

## 2013-09-02 DIAGNOSIS — I059 Rheumatic mitral valve disease, unspecified: Secondary | ICD-10-CM

## 2013-09-02 LAB — URINALYSIS, ROUTINE W REFLEX MICROSCOPIC
Bilirubin Urine: NEGATIVE
Glucose, UA: NEGATIVE mg/dL
Ketones, ur: NEGATIVE mg/dL
Nitrite: POSITIVE — AB
Protein, ur: NEGATIVE mg/dL
Specific Gravity, Urine: 1.026 (ref 1.005–1.030)
Urobilinogen, UA: 0.2 mg/dL (ref 0.0–1.0)
pH: 5 (ref 5.0–8.0)

## 2013-09-02 LAB — BASIC METABOLIC PANEL
Anion gap: 15 (ref 5–15)
BUN: 22 mg/dL (ref 6–23)
CALCIUM: 9.1 mg/dL (ref 8.4–10.5)
CO2: 23 meq/L (ref 19–32)
CREATININE: 1.35 mg/dL — AB (ref 0.50–1.10)
Chloride: 101 mEq/L (ref 96–112)
GFR calc Af Amer: 49 mL/min — ABNORMAL LOW (ref >90)
GFR, EST NON AFRICAN AMERICAN: 42 mL/min — AB (ref >90)
GLUCOSE: 107 mg/dL — AB (ref 70–99)
Potassium: 3.8 mEq/L (ref 3.7–5.3)
SODIUM: 139 meq/L (ref 137–147)

## 2013-09-02 LAB — TROPONIN I
TROPONIN I: 0.36 ng/mL — AB (ref <0.30)
Troponin I: 0.41 ng/mL (ref <0.30)

## 2013-09-02 LAB — CBC
HCT: 32.7 % — ABNORMAL LOW (ref 36.0–46.0)
HEMOGLOBIN: 10.6 g/dL — AB (ref 12.0–15.0)
MCH: 29.1 pg (ref 26.0–34.0)
MCHC: 32.4 g/dL (ref 30.0–36.0)
MCV: 89.8 fL (ref 78.0–100.0)
Platelets: 297 10*3/uL (ref 150–400)
RBC: 3.64 MIL/uL — AB (ref 3.87–5.11)
RDW: 13.3 % (ref 11.5–15.5)
WBC: 7 10*3/uL (ref 4.0–10.5)

## 2013-09-02 LAB — I-STAT TROPONIN, ED: Troponin i, poc: 0.01 ng/mL (ref 0.00–0.08)

## 2013-09-02 LAB — PRO B NATRIURETIC PEPTIDE: Pro B Natriuretic peptide (BNP): 1566 pg/mL — ABNORMAL HIGH (ref 0–125)

## 2013-09-02 LAB — URINE MICROSCOPIC-ADD ON

## 2013-09-02 MED ORDER — SODIUM CHLORIDE 0.9 % IV SOLN: 250.0000 mL | INTRAVENOUS | Status: DC | PRN

## 2013-09-02 MED ORDER — LISINOPRIL 5 MG PO TABS
5.0000 mg | ORAL_TABLET | Freq: Every day | ORAL | Status: DC
  Administered 2013-09-02 – 2013-09-06 (×4): 5 mg via ORAL
  Filled 2013-09-02 (×6): qty 1

## 2013-09-02 MED ORDER — CARVEDILOL 6.25 MG PO TABS
6.2500 mg | ORAL_TABLET | Freq: Every day | ORAL | Status: DC
  Administered 2013-09-02 – 2013-09-07 (×5): 6.25 mg via ORAL
  Filled 2013-09-02 (×6): qty 1

## 2013-09-02 MED ORDER — ASPIRIN 81 MG PO CHEW
81.0000 mg | CHEWABLE_TABLET | ORAL | Status: AC
  Administered 2013-09-03: 81 mg via ORAL
  Filled 2013-09-02: qty 1

## 2013-09-02 MED ORDER — SODIUM CHLORIDE 0.9 % IJ SOLN: 3.0000 mL | INTRAMUSCULAR | Status: DC | PRN

## 2013-09-02 MED ORDER — CARVEDILOL 3.125 MG PO TABS
3.1250 mg | ORAL_TABLET | Freq: Every day | ORAL | Status: DC
  Filled 2013-09-02 (×2): qty 1

## 2013-09-02 MED ORDER — ASPIRIN 325 MG PO TABS: 325.0000 mg | ORAL_TABLET | ORAL | Status: DC

## 2013-09-02 MED ORDER — LEVOTHYROXINE SODIUM 25 MCG PO TABS
12.5000 ug | ORAL_TABLET | Freq: Every day | ORAL | Status: DC
  Administered 2013-09-03 – 2013-09-07 (×5): 12.5 ug via ORAL
  Filled 2013-09-02 (×6): qty 0.5

## 2013-09-02 MED ORDER — ASPIRIN 81 MG PO CHEW: 81.0000 mg | CHEWABLE_TABLET | Freq: Every day | ORAL | Status: DC

## 2013-09-02 MED ORDER — ATORVASTATIN CALCIUM 40 MG PO TABS
40.0000 mg | ORAL_TABLET | Freq: Every day | ORAL | Status: DC
  Administered 2013-09-02 – 2013-09-07 (×6): 40 mg via ORAL
  Filled 2013-09-02 (×6): qty 1

## 2013-09-02 MED ORDER — NITROGLYCERIN 0.4 MG SL SUBL: 0.4000 mg | SUBLINGUAL_TABLET | SUBLINGUAL | Status: DC | PRN

## 2013-09-02 MED ORDER — ACETAMINOPHEN 325 MG PO TABS
650.0000 mg | ORAL_TABLET | ORAL | Status: DC | PRN
  Administered 2013-09-03: 650 mg via ORAL
  Filled 2013-09-02 (×2): qty 2

## 2013-09-02 MED ORDER — ONDANSETRON HCL 4 MG/2ML IJ SOLN: 4.0000 mg | Freq: Four times a day (QID) | INTRAMUSCULAR | Status: DC | PRN

## 2013-09-02 MED ORDER — DIGOXIN 0.0625 MG HALF TABLET
0.0625 mg | ORAL_TABLET | Freq: Every day | ORAL | Status: DC
  Administered 2013-09-02 – 2013-09-07 (×5): 0.0625 mg via ORAL
  Filled 2013-09-02 (×6): qty 1

## 2013-09-02 MED ORDER — SODIUM CHLORIDE 0.9 % IJ SOLN
3.0000 mL | Freq: Two times a day (BID) | INTRAMUSCULAR | Status: DC
  Administered 2013-09-02 – 2013-09-03 (×3): 3 mL via INTRAVENOUS

## 2013-09-02 MED ORDER — ASPIRIN EC 81 MG PO TBEC
81.0000 mg | DELAYED_RELEASE_TABLET | Freq: Every day | ORAL | Status: DC
  Administered 2013-09-03 – 2013-09-07 (×5): 81 mg via ORAL
  Filled 2013-09-02 (×5): qty 1

## 2013-09-02 MED ORDER — SODIUM CHLORIDE 0.9 % IV BOLUS (SEPSIS)
1000.0000 mL | Freq: Once | INTRAVENOUS | Status: AC
  Administered 2013-09-02: 1000 mL via INTRAVENOUS

## 2013-09-02 NOTE — ED Provider Notes (Signed)
CSN: [id number]     Arrival date & time 09/02/13  0813 History   First MD Initiated Contact with Patient 09/02/13 [phone]     Chief Complaint  Patient presents with  . Chest Pain     (Consider location/radiation/quality/duration/timing/severity/associated sxs/prior Treatment) HPI Patient presents to the emergency department with left-sided chest pain, that started this morning while she was drinking.  Her coffee.  The patient, states, that the pain radiated to her left arm and neck.  The patient, states, that she took 4 baby aspirin at home and called EMS.  The patient was given nitroglycerin in route with resolution of her chest pain.  The patient, states, that she did not have any weakness, dizzy, nausea, vomiting, diarrhea, abdominal pain, back pain, fever, cough, abdominal pain, or syncope.  The patient, states she didn't have some diaphoresis during the episode Past Medical History  Diagnosis Date  . Diverticulosis   . Pancreatitis   . Hypertension   . Stroke 2011  . Hyperlipidemia   . Anxiety   . GERD (gastroesophageal reflux disease)   . ETOH abuse   . chronic systolic heart failure     a. ECHO (05/08/13): EF 20-25%, diff HK with akinesis of basal inferior wall, grade 1 DD, trivial MR, trivial TR b) RHC (05/06/13): RA 7, RV 30/2, PA 31/19 (24), PCWP 10, Fick CO/CI: 2.9/1.74, Thermo CO/Ci: 2.4/1.4, PA sat 53%  . Ischemic cardiomyopathy   . CAD (coronary artery disease)     a. LHC (04/2013): Chronically occluded LAD, moderate dz in large OM1 and severe dz and small posterior lateral vessel    . A-fib    Past Surgical History  Procedure Laterality Date  . Colostomy    . Ileostomy    . Colostomy takedown     Family History  Problem Relation Age of Onset  . CAD Father 65    MI  . CAD Mother 24    MI  . CAD Sister 42    Stent  . CAD Brother 42    MI   History  Substance Use Topics  . Smoking status: Former Smoker -- 1.00 packs/day for 40 years    Types: Cigarettes  .  Smokeless tobacco: Never Used     Comment: Quit April   . Alcohol Use: Yes     Comment: 2 3 TIMES A WEEK   OB History   Grav Para Term Preterm Abortions TAB SAB Ect Mult Living                 Review of Systems All other systems negative except as documented in the HPI. All pertinent positives and negatives as reviewed in the HPI.   Allergies  Penicillins  Home Medications   Prior to Admission medications   Medication Sig Start Date End Date Taking? Authorizing Provider  aspirin 81 MG chewable tablet Chew 1 tablet (81 mg total) by mouth daily. 05/14/13  Yes Geradine Girt, DO  atorvastatin (LIPITOR) 80 MG tablet Take 0.5 tablets (40 mg total) by mouth daily at 6 PM. 08/17/13  Yes Rande Brunt, NP  carvedilol (COREG) 3.125 MG tablet Take 3.125-6.25 mg by mouth 2 (two) times daily with a meal. 3.125mg  in the morning and 6.25mg  in the evening   Yes Historical Provider, MD  digoxin (LANOXIN) 0.125 MG tablet Take 0.5 tablets (0.0625 mg total) by mouth daily. 08/17/13  Yes Rande Brunt, NP  levothyroxine (SYNTHROID, LEVOTHROID) 25 MCG tablet Take 0.5 tablets (12.5 mcg  total) by mouth daily before breakfast. 08/17/13  Yes Rande Brunt, NP  lisinopril (PRINIVIL,ZESTRIL) 5 MG tablet Take 1 tablet (5 mg total) by mouth at bedtime. 08/17/13  Yes Rande Brunt, NP  rivaroxaban (XARELTO) 20 MG TABS tablet Take 1 tablet (20 mg total) by mouth daily with supper. 08/17/13  Yes Rande Brunt, NP   BP 124/62  Pulse 60  Temp(Src) 97.6 F (36.4 C) (Oral)  Resp 16  Ht 5\' 3"  (1.6 m)  Wt 125 lb (56.7 kg)  BMI 22.15 kg/m2  SpO2 100% Physical Exam  Nursing note and vitals reviewed. Constitutional: She is oriented to person, place, and time. She appears well-developed and well-nourished. No distress.  HENT:  Head: Normocephalic and atraumatic.  Mouth/Throat: Oropharynx is clear and moist.  Eyes: Pupils are equal, round, and reactive to light.  Neck: Normal range of motion. Neck supple.   Cardiovascular: Normal rate, regular rhythm and normal heart sounds.  Exam reveals no gallop and no friction rub.   No murmur heard. Pulmonary/Chest: Effort normal and breath sounds normal. No respiratory distress.  Musculoskeletal: She exhibits no edema.  Neurological: She is alert and oriented to person, place, and time. She exhibits normal muscle tone. Coordination normal.  Skin: Skin is warm and dry.    ED Course  Procedures (including critical care time) Labs Review Labs Reviewed  CBC - Abnormal; Notable for the following:    RBC 3.64 (*)    Hemoglobin 10.6 (*)    HCT 32.7 (*)    All other components within normal limits  BASIC METABOLIC PANEL - Abnormal; Notable for the following:    Glucose, Bld 107 (*)    Creatinine, Ser 1.35 (*)    GFR calc non Af Amer 42 (*)    GFR calc Af Amer 49 (*)    All other components within normal limits  PRO B NATRIURETIC PEPTIDE - Abnormal; Notable for the following:    Pro B Natriuretic peptide (BNP) 1566.0 (*)    All other components within normal limits  TROPONIN I  URINALYSIS, ROUTINE W REFLEX MICROSCOPIC  I-STAT TROPOININ, ED    Imaging Review Dg Chest Port 1 View  09/02/2013   CLINICAL DATA:  Chest pain  EXAM: PORTABLE CHEST - 1 VIEW  COMPARISON:  Jun 05, 2013  FINDINGS: There is no edema or consolidation. The heart size and pulmonary vascularity are normal. No pneumothorax. No adenopathy. No bone lesions.  IMPRESSION: No abnormality noted.   Electronically Signed   By: Lowella Grip M.D.   On: 09/02/2013 08:47     EKG Interpretation   Date/Time:  Thursday September 02 2013 08:16:49 EDT Ventricular Rate:  96 PR Interval:  127 QRS Duration: 167 QT Interval:  382 QTC Calculation: 483 R Axis:   68 Text Interpretation:  Sinus rhythm IVCD, consider atypical LBBB No  significant change since last tracing Confirmed by GOLDSTON  MD, SCOTT  ([id number]) on 09/02/2013 8:27:12 AM      MDM   Final diagnoses:  Unstable angina pectoris   Chronic systolic CHF (congestive heart failure)  Coronary artery disease involving native coronary artery of native heart with unstable angina pectoris  LBBB (left bundle branch block)  Ischemic cardiomyopathy   Patient be admitted to the hospital for further evaluation and care.  I spoke with cardiology will be down to see the patient.  She has been pain free here in the emergency department    Brent General, PA-C 09/02/13 1159

## 2013-09-02 NOTE — Progress Notes (Signed)
Cardiac cath images and other data reviewed and discussed with Dr. Angelena Form. Plan repeat coronary angio and possible OM2 PCI-stent in AM if there is evidence of disease progression/acute unstable plaque.  Sanda Klein, MD, [facility] [phone] office [phone] pager

## 2013-09-02 NOTE — ED Notes (Signed)
Pt in from home via [facility], pt reports having L sided CP that radiates into L arm with tingling in the L arm, per report pt c/o SOB, pt reports n/v, reports x 1 vomiting, denies diarrhea, pt rcvd 324 mg ASA, pt rcvd x 2 SL nitro PTA with relief 10/10 to 0/10, pt hx of A fib, A&O x4, follows commands, speaks in complete sentences, per report pt has heart blockages unspecified

## 2013-09-02 NOTE — Care Management Note (Addendum)
  Page 1 of 1   09/07/2013     4:05:26 PM CARE MANAGEMENT NOTE 09/07/2013  Patient:  [name], [name]   Account Number:  [id number]  Date Initiated:  09/02/2013  Documentation initiated by:  Fuller Mandril  Subjective/Objective Assessment:   58 yo female presents today with a prolonged episode of left side chest pain at rest.//Home with spouse.     Action/Plan:   Admit for serial cardiac enzymes; possible cardiac cath with PCI.// Access for disposition needs.   Anticipated DC Date:  09/04/2013   Anticipated DC Plan:  [place]  CM consult      Choice offered to / List presented to:             Status of service:  Completed, signed off Medicare Important Message given?   (If response is "NO", the following Medicare IM given date fields will be blank) Date Medicare IM given:   Medicare IM given by:   Date Additional Medicare IM given:   Additional Medicare IM given by:    Discharge Disposition:  HOME/SELF CARE  Per UR Regulation:  Reviewed for med. necessity/level of care/duration of stay  If discussed at Long Length of Stay Meetings, dates discussed:    Comments:  Cereniti Curb RN, BSN, MSHL, CCM  Nurse - Case Manager,  (Unit [place])  [phone]  09/07/2013 unstable angina & MI Social:  From home with significant other. MCD/[place] Past hx/o d/c to [facility] 05/15/2013 via [facility]: active Dispostion Plan:  Home / self care.

## 2013-09-02 NOTE — H&P (Signed)
Chief Complaint:  Chest pain at rest  HPI:  The patient presents today with a prolonged episode of left side chest pain at rest that began when she woke at 7:00 this morning. A similar episode occurred in June but resolved spontaneously. She also shortness of breath nausea and one vomiting event. Her symptoms persisted and so she received 2 sublingual nitroglycerin. These are to complete relief in her symptoms have not recurred while in the emergency room.   Her EKG is nondiagnostic due to chronic left bundle branch block. Her first cardiac troponin is normal. Her pro BNP is 1566, slightly higher than last month but substantially lower than in April. Her chest x-ray shows no evidence of CHF.  This is a 58 y.o. female with a past medical history significant for severe ischemic cardiomyopathy (EF 20-25%), extensive CAD with chronic total occlusion of the LAD artery and moderate stenosis in a large second oblique marginal artery and severe disease in a small posterolateral ventricular artery, admitted with congestive heart failure in April 2015 and with paroxysmal fibrillation rapid ventricular response in May 2015. Has a very broad left bundle branch block History of hypertension and hyperlipidemia, not treated for many years. History of previous stroke with some residual left hemiparesis. Also has a history of chronic alcohol abuse and acute pancreatitis in April. Currently followed in the heart failure clinic for titration of heart failure medications and consideration of future defibrillator implantation. Has been abstinent from alcohol and tobacco since the spring. Her home "dry weight" seems to be around 125 pounds. Discharge weight from a hospital in April was 140 pounds. On July 29 she was instructed to stop furosemide and hold lisinopril for 2 days secondary to elevation in creatinine. On August 10 her furosemide and Aldactone were discontinued (August 6 creatinine had been 1.8, today creatinine  down to 1.35)  PMHx:  Past Medical History  Diagnosis Date  . Diverticulosis   . Pancreatitis   . Hypertension   . Stroke 2011  . Hyperlipidemia   . Anxiety   . GERD (gastroesophageal reflux disease)   . ETOH abuse   . chronic systolic heart failure     a. ECHO (05/08/13): EF 20-25%, diff HK with akinesis of basal inferior wall, grade 1 DD, trivial MR, trivial TR b) RHC (05/06/13): RA 7, RV 30/2, PA 31/19 (24), PCWP 10, Fick CO/CI: 2.9/1.74, Thermo CO/Ci: 2.4/1.4, PA sat 53%  . Ischemic cardiomyopathy   . CAD (coronary artery disease)     a. LHC (04/2013): Chronically occluded LAD, moderate dz in large OM1 and severe dz and small posterior lateral vessel    . A-fib     Past Surgical History  Procedure Laterality Date  . Colostomy    . Ileostomy    . Colostomy takedown      FAMHx:  Family History  Problem Relation Age of Onset  . CAD Father 9    MI  . CAD Mother 44    MI  . CAD Sister 49    Stent  . CAD Brother 48    MI    SOCHx:   reports that she has quit smoking. Her smoking use included Cigarettes. She has a 40 pack-year smoking history. She has never used smokeless tobacco. She reports that she drinks alcohol. She reports that she does not use illicit drugs.  ALLERGIES:  Allergies  Allergen Reactions  . Penicillins Anaphylaxis and Rash    ROS: New York Heart Association functional class II exertional  dyspnea, denies edema, palpitations, syncope or exertional chest pain. Nausea and vomiting associated with chest pain as described above. Usually does not have dyspnea at rest except for events that occurred this morning. Did not have edema when she had heart failure in the past. Denies recent fever-chills, abdominal pain, change in bowel pattern, gastrointestinal bleeding, excessive bruising or bleeding, new focal neurological events, dysuria, hematuria, polyuria polydipsia, major weight changes, mood swings, allergic reactions, wheezing, cough, nasal congestion or  epistaxis, change in her voice quality, intolerance to heat or cold  HOME MEDS:  (Not in a hospital admission) No current facility-administered medications on file prior to encounter.   Current Outpatient Prescriptions on File Prior to Encounter  Medication Sig Dispense Refill  . aspirin 81 MG chewable tablet Chew 1 tablet (81 mg total) by mouth daily.      Marland Kitchen atorvastatin (LIPITOR) 80 MG tablet Take 0.5 tablets (40 mg total) by mouth daily at 6 PM.  30 tablet  6  . digoxin (LANOXIN) 0.125 MG tablet Take 0.5 tablets (0.0625 mg total) by mouth daily.  15 tablet  3  . levothyroxine (SYNTHROID, LEVOTHROID) 25 MCG tablet Take 0.5 tablets (12.5 mcg total) by mouth daily before breakfast.  30 tablet  0  . lisinopril (PRINIVIL,ZESTRIL) 5 MG tablet Take 1 tablet (5 mg total) by mouth at bedtime.  30 tablet  3  . rivaroxaban (XARELTO) 20 MG TABS tablet Take 1 tablet (20 mg total) by mouth daily with supper.  30 tablet  3   carvedilol 3.125 mg every morning and 6.25 mg in the afternoon  LABS/IMAGING: Results for orders placed during the hospital encounter of 09/02/13 (from the past 48 hour(s))  CBC     Status: Abnormal   Collection Time    09/02/13  8:35 AM      Result Value Ref Range   WBC 7.0  4.0 - 10.5 K/uL   RBC 3.64 (*) 3.87 - 5.11 MIL/uL   Hemoglobin 10.6 (*) 12.0 - 15.0 g/dL   HCT 32.7 (*) 36.0 - 46.0 %   MCV 89.8  78.0 - 100.0 fL   MCH 29.1  26.0 - 34.0 pg   MCHC 32.4  30.0 - 36.0 g/dL   RDW 13.3  11.5 - 15.5 %   Platelets 297  150 - 400 K/uL  BASIC METABOLIC PANEL     Status: Abnormal   Collection Time    09/02/13  8:35 AM      Result Value Ref Range   Sodium 139  137 - 147 mEq/L   Potassium 3.8  3.7 - 5.3 mEq/L   Chloride 101  96 - 112 mEq/L   CO2 23  19 - 32 mEq/L   Glucose, Bld 107 (*) 70 - 99 mg/dL   BUN 22  6 - 23 mg/dL   Creatinine, Ser 1.35 (*) 0.50 - 1.10 mg/dL   Calcium 9.1  8.4 - 10.5 mg/dL   GFR calc non Af Amer 42 (*) >90 mL/min   GFR calc Af Amer 49 (*) >90  mL/min   Comment: (NOTE)     The eGFR has been calculated using the CKD EPI equation.     This calculation has not been validated in all clinical situations.     eGFR's persistently <90 mL/min signify possible Chronic Kidney     Disease.   Anion gap 15  5 - 15  PRO B NATRIURETIC PEPTIDE     Status: Abnormal   Collection Time  09/02/13  8:35 AM      Result Value Ref Range   Pro B Natriuretic peptide (BNP) 1566.0 (*) 0 - 125 pg/mL  TROPONIN I     Status: None   Collection Time    09/02/13  8:35 AM      Result Value Ref Range   Troponin I <0.30  <0.30 ng/mL   Comment:            Due to the release kinetics of cTnI,     a negative result within the first hours     of the onset of symptoms does not rule out     myocardial infarction with certainty.     If myocardial infarction is still suspected,     repeat the test at appropriate intervals.  Randolm Idol, ED     Status: None   Collection Time    09/02/13  8:41 AM      Result Value Ref Range   Troponin i, poc 0.01  0.00 - 0.08 ng/mL   Comment 3            Comment: Due to the release kinetics of cTnI,     a negative result within the first hours     of the onset of symptoms does not rule out     myocardial infarction with certainty.     If myocardial infarction is still suspected,     repeat the test at appropriate intervals.   Dg Chest Port 1 View  09/02/2013   CLINICAL DATA:  Chest pain  EXAM: PORTABLE CHEST - 1 VIEW  COMPARISON:  Jun 05, 2013  FINDINGS: There is no edema or consolidation. The heart size and pulmonary vascularity are normal. No pneumothorax. No adenopathy. No bone lesions.  IMPRESSION: No abnormality noted.   Electronically Signed   By: Lowella Grip M.D.   On: 09/02/2013 08:47    VITALS: Blood pressure 106/64, pulse 53, temperature 97.6 F (36.4 C), temperature source Oral, resp. rate 14, height _0  (1.6 m), weight 125 lb (56.7 kg), SpO2 98.00%.  EXAM:  General: Alert, oriented x3, no distress;  she appears rather slender and chronically ill Head: no evidence of trauma, PERRL, EOMI, no exophtalmos or lid lag, no myxedema, no xanthelasma; normal ears, nose and oropharynx Neck: Normal jugular venous pulsations and no hepatojugular reflux; brisk carotid pulses without delay and no carotid bruits Chest: clear to auscultation, no signs of consolidation by percussion or palpation, normal fremitus, symmetrical and full respiratory excursions Cardiovascular: normal position and quality of the apical impulse, regular rhythm, normal first heart sound and a paradoxically split second heart sounds, no rubs or gallops, no murmur Abdomen: no tenderness or distention, no masses by palpation, no abnormal pulsatility or arterial bruits, normal bowel sounds, no hepatosplenomegaly Extremities: no clubbing, cyanosis or edema; 2+ radial, ulnar and brachial pulses bilaterally; 2+ right femoral, posterior tibial and dorsalis pedis pulses; 2+ left femoral, posterior tibial and dorsalis pedis pulses; no subclavian or femoral bruits Neurological: grossly nonfocal   IMPRESSION: Unstable angina pectoris (angina at rest, promptly relieved with nitroglycerin) with nondiagnostic electrocardiogram (pre-existing left bundle branch block) and low risk cardiac enzymes  PLAN: Admit for serial cardiac enzymes. She is fully anticoagulated and heparin does not appear indicated. Check echocardiogram for evidence of new wall motion abnormalities and for any improvement in left ventricular ejection fraction compared to April. Consider ischemia-viability study. Review coronary angiograms and discuss further management with Dr. Aundra Dubin. Performing percutaneous revascularization of  the oblique marginal artery would be beneficial if this is the culprit for her unstable angina, but is unlikely to have a major impact on overall LV function and would delay implantation of defibrillator (preferably CRT-D device) for a minimum of 90  days  Sanda Klein, MD, [facility] [phone] office [phone] pager  09/02/2013, 11:30 AM

## 2013-09-02 NOTE — Progress Notes (Signed)
Lab called critical troponin 0.36.  MD paged.

## 2013-09-02 NOTE — Progress Notes (Signed)
  Echocardiogram 2D Echocardiogram has been performed.  [name] M 09/02/2013, 3:41 PM

## 2013-09-02 NOTE — Progress Notes (Signed)
Pt admitted to room 3e20.  Pt alert and oriented, VS stable, and pain free.  Admitting MD paged.  Will continue to monitor.

## 2013-09-03 ENCOUNTER — Inpatient Hospital Stay (HOSPITAL_COMMUNITY): Payer: Medicaid Other

## 2013-09-03 ENCOUNTER — Encounter (HOSPITAL_COMMUNITY)
Admission: EM | Disposition: A | Discharge status: Home / Self Care | Payer: Self-pay | Source: Home / Self Care | Attending: Cardiovascular Disease

## 2013-09-03 DIAGNOSIS — I255 Ischemic cardiomyopathy: Secondary | ICD-10-CM | POA: Diagnosis present

## 2013-09-03 DIAGNOSIS — N289 Disorder of kidney and ureter, unspecified: Secondary | ICD-10-CM

## 2013-09-03 DIAGNOSIS — N179 Acute kidney failure, unspecified: Secondary | ICD-10-CM

## 2013-09-03 DIAGNOSIS — I2510 Atherosclerotic heart disease of native coronary artery without angina pectoris: Secondary | ICD-10-CM | POA: Diagnosis present

## 2013-09-03 DIAGNOSIS — N189 Chronic kidney disease, unspecified: Secondary | ICD-10-CM | POA: Diagnosis present

## 2013-09-03 HISTORY — PX: LEFT HEART CATHETERIZATION WITH CORONARY ANGIOGRAM: SHX5451

## 2013-09-03 LAB — HEMOGLOBIN AND HEMATOCRIT, BLOOD
HCT: 30.1 % — ABNORMAL LOW (ref 36.0–46.0)
HEMOGLOBIN: 9.7 g/dL — AB (ref 12.0–15.0)

## 2013-09-03 LAB — BASIC METABOLIC PANEL
ANION GAP: 13 (ref 5–15)
BUN: 24 mg/dL — ABNORMAL HIGH (ref 6–23)
CALCIUM: 9 mg/dL (ref 8.4–10.5)
CO2: 23 mEq/L (ref 19–32)
Chloride: 105 mEq/L (ref 96–112)
Creatinine, Ser: 1.48 mg/dL — ABNORMAL HIGH (ref 0.50–1.10)
GFR calc Af Amer: 44 mL/min — ABNORMAL LOW (ref >90)
GFR, EST NON AFRICAN AMERICAN: 38 mL/min — AB (ref >90)
Glucose, Bld: 98 mg/dL (ref 70–99)
Potassium: 4.3 mEq/L (ref 3.7–5.3)
SODIUM: 141 meq/L (ref 137–147)

## 2013-09-03 LAB — LIPID PANEL
CHOLESTEROL: 159 mg/dL (ref 0–200)
HDL: 32 mg/dL — ABNORMAL LOW (ref >39)
LDL Cholesterol: 92 mg/dL (ref 0–99)
Total CHOL/HDL Ratio: 5 RATIO
Triglycerides: 177 mg/dL — ABNORMAL HIGH (ref <150)
VLDL: 35 mg/dL (ref 0–40)

## 2013-09-03 LAB — TROPONIN I: TROPONIN I: 0.32 ng/mL — AB (ref <0.30)

## 2013-09-03 SURGERY — LEFT HEART CATHETERIZATION WITH CORONARY ANGIOGRAM
Anesthesia: LOCAL

## 2013-09-03 MED ORDER — FENTANYL CITRATE 0.05 MG/ML IJ SOLN
INTRAMUSCULAR | Status: AC
  Filled 2013-09-03: qty 2

## 2013-09-03 MED ORDER — HEPARIN (PORCINE) IN NACL 2-0.9 UNIT/ML-% IJ SOLN
INTRAMUSCULAR | Status: AC
  Filled 2013-09-03: qty 1000

## 2013-09-03 MED ORDER — SODIUM CHLORIDE 0.9 % IV SOLN
INTRAVENOUS | Status: DC
  Administered 2013-09-03: 09:00:00 via INTRAVENOUS

## 2013-09-03 MED ORDER — HEPARIN SODIUM (PORCINE) 1000 UNIT/ML IJ SOLN
INTRAMUSCULAR | Status: AC
  Filled 2013-09-03: qty 1

## 2013-09-03 MED ORDER — SODIUM CHLORIDE 0.9 % IV SOLN: 1.0000 mL/kg/h | INTRAVENOUS | Status: AC

## 2013-09-03 MED ORDER — MIDAZOLAM HCL 2 MG/2ML IJ SOLN
INTRAMUSCULAR | Status: AC
  Filled 2013-09-03: qty 2

## 2013-09-03 MED ORDER — LIDOCAINE HCL (PF) 1 % IJ SOLN
INTRAMUSCULAR | Status: AC
  Filled 2013-09-03: qty 30

## 2013-09-03 MED ORDER — HEPARIN (PORCINE) IN NACL 2-0.9 UNIT/ML-% IJ SOLN
INTRAMUSCULAR | Status: AC
  Filled 2013-09-03: qty 1500

## 2013-09-03 MED ORDER — CARVEDILOL 3.125 MG PO TABS
3.1250 mg | ORAL_TABLET | Freq: Every day | ORAL | Status: DC
  Administered 2013-09-05 – 2013-09-07 (×3): 3.125 mg via ORAL
  Filled 2013-09-03 (×5): qty 1

## 2013-09-03 MED ORDER — NITROGLYCERIN 1 MG/10 ML FOR IR/CATH LAB
INTRA_ARTERIAL | Status: AC
  Filled 2013-09-03: qty 10

## 2013-09-03 MED ORDER — ONDANSETRON HCL 4 MG/2ML IJ SOLN: 4.0000 mg | Freq: Four times a day (QID) | INTRAMUSCULAR | Status: DC | PRN

## 2013-09-03 MED ORDER — VERAPAMIL HCL 2.5 MG/ML IV SOLN
INTRAVENOUS | Status: AC
  Filled 2013-09-03: qty 2

## 2013-09-03 NOTE — Progress Notes (Signed)
BP=86/44 manually. Pt asymptomatic. Stated "feels good". No c/o weakness nor dizziness, no cp.observed pt closely

## 2013-09-03 NOTE — Progress Notes (Signed)
Pt had episodes of bradycardia. Pt asymptomatic .observed pt closely

## 2013-09-03 NOTE — ED Provider Notes (Signed)
Medical screening examination/treatment/procedure(s) were performed by non-physician practitioner and as supervising physician I was immediately available for consultation/collaboration.   EKG Interpretation   Date/Time:  Thursday September 02 2013 08:16:49 EDT Ventricular Rate:  96 PR Interval:  127 QRS Duration: 167 QT Interval:  382 QTC Calculation: 483 R Axis:   68 Text Interpretation:  Sinus rhythm IVCD, consider atypical LBBB No  significant change since last tracing Confirmed by Ahmani Prehn  MD, Kimyatta Lecy  ([id number]) on 09/02/2013 8:27:12 AM        Ephraim Hamburger, MD 09/03/13 [phone]

## 2013-09-03 NOTE — Progress Notes (Signed)
Patient returned from [place]. Still has c/o pain, 8/10 to left hip/back area.  Dr Andy Gauss notified for BP/pulse parameters for qhs lisinopril-per MD hold tonights dose. Will continue to monitor. [name]

## 2013-09-03 NOTE — Interval H&P Note (Signed)
Cath Lab Visit (complete for each Cath Lab visit)  Clinical Evaluation Leading to the Procedure:   ACS: Yes.    Non-ACS:    Anginal Classification: CCS III  Anti-ischemic medical therapy: Maximal Therapy (2 or more classes of medications)  Non-Invasive Test Results: No non-invasive testing performed  Prior CABG: No previous CABG      History and Physical Interval Note:  09/03/2013 1:33 PM  [name]  has presented today for surgery, with the diagnosis of cp  The various methods of treatment have been discussed with the patient and family. After consideration of risks, benefits and other options for treatment, the patient has consented to  Procedure(s): LEFT HEART CATHETERIZATION WITH CORONARY ANGIOGRAM (N/A) as a surgical intervention .  The patient's history has been reviewed, patient examined, no change in status, stable for surgery.  I have reviewed the patient's chart and labs.  Questions were answered to the patient's satisfaction.     Sinclair Grooms

## 2013-09-03 NOTE — Progress Notes (Signed)
Pt's BP now 102/52 manually.  Pt is c/o pain 8/10 above of cath site and radiates to hip.  PA paged.

## 2013-09-03 NOTE — Progress Notes (Signed)
Notified MD about pt's BP through the night.  MD ordered to start flds at 100/hr.  Order carried out will continue to monitor.

## 2013-09-03 NOTE — Progress Notes (Signed)
Was told by night shift RN that pt.was experiencing pain above her left femoral site and blood pressure was low. Pt.had cardiac cath done this afternoon. Day shift RN had paged MD on call to notify. Went in to assess patient with night shift RN. Pt.had c/o pain above her left femoral site that also radiated to her back. Pt's BP also appeared low. Checked femoral site under dressing and no bleeding was noted. Pt.had a small pinpoint amount of old blood drainage on gauze. No visible hematoma was seen. Assessed LLE and it was warm, dry, capillary refill was normal, pulse was 1+. Called charge RN on 6 C to come look at patient's groin site as well. MD called back and new orders were written.

## 2013-09-03 NOTE — Progress Notes (Signed)
DR Tommi Rumps made aware of patient's BP=72/42. With HR =49. Pt asymptomatic. No c/o weakness,dizziness nor chest pains.lower extremities elevated as per MD order.will continue to monitor pt.

## 2013-09-03 NOTE — Progress Notes (Signed)
Pt's BP 78/42 manually, pt asymptomatic, MD paged.

## 2013-09-03 NOTE — Progress Notes (Addendum)
During bedside report/rounding, patient stated that she had left hip pain radiating to her back-rating it an 8/10.  LLE warm with palpable pulses.  Cath site CDI, level 0. No hematoma palpated or noted. BP remains soft, but patient is asymptomatic. RN from [facility] asked to come assess, same findings noted. Rhonda Barrett notified. Received orders for STAT H&H and STAT CT abd/pelvis.  Will continue to monitor.

## 2013-09-03 NOTE — Progress Notes (Signed)
    Subjective:  No chest pain overnight  Objective:  Vital Signs in the last 24 hours: Temp:  [97.1 F (36.2 C)-98.7 F (37.1 C)] 97.6 F (36.4 C) (08/14 0555) Pulse Rate:  [47-102] 49 (08/14 0555) Resp:  [13-18] 18 (08/14 0555) BP: (72-124)/(42-64) 86/44 mmHg (08/14 0555) SpO2:  [95 %-100 %] 99 % (08/14 0555) Weight:  [122 lb 3.2 oz (55.43 kg)-122 lb 12.7 oz (55.7 kg)] 122 lb 12.7 oz (55.7 kg) (08/14 0619)  Intake/Output from previous day:  Intake/Output Summary (Last 24 hours) at 09/03/13 0848 Last data filed at 09/03/13 0844  Gross per 24 hour  Intake    480 ml  Output    925 ml  Net   -445 ml    Physical Exam: General appearance: alert, cooperative and no distress Lungs: clear to auscultation bilaterally Heart: regular rate and rhythm   Rate: 42-60  Rhythm: normal sinus rhythm and sinus bradycardia  Lab Results:  Recent Labs  09/02/13 0835  WBC 7.0  HGB 10.6*  PLT 297    Recent Labs  09/02/13 0835 09/03/13 0032  NA 139 141  K 3.8 4.3  CL 101 105  CO2 23 23  GLUCOSE 107* 98  BUN 22 24*  CREATININE 1.35* 1.48*    Recent Labs  09/02/13 1905 09/03/13 0032  TROPONINI 0.41* 0.32*   No results found for this basename: INR,  in the last 72 hours  Imaging: Imaging results have been reviewed  Cardiac Studies:  Assessment/Plan:  58 y.o. female with a PMH for severe ICM (EF 20-25%), extensive CAD with chronic total occlusion of the LAD artery and moderate stenosis in a large OM2 artery and severe disease in a small posterolateral ventricular artery. She has a very broad left bundle branch block, history of hypertension, PAF, and hyperlipidemia.. She has a history of previous stroke with some residual left hemiparesis. Also has a history of chronic alcohol abuse and acute pancreatitis. Currently followed in the heart failure clinic for titration of heart failure medications and consideration of future defibrillator implantation. She reports she has been  alcohol free. As an OP her SCr was elevated and her medications were being adjusted. She was admitted 09/02/13 with [place] as well as an elevated BNP-1566- though no SOB.    Principal Problem:   Unstable angina pectoris Active Problems:   Elevated troponin   Chronic systolic CHF (congestive heart failure)   PAF (paroxysmal atrial fibrillation)   Cardiomyopathy, ischemic- EF 25-30% 2D 09/02/13   CAD- total LAD- residual OM2 disease   Acute on chronic renal insufficiency   Hypertension    PLAN: Chest pain with elevated Troponin-cath today.  Acute on chronic renal insufficieny, her medications were being adjusted as an OP. BNP elevated though she denies dyspnea.   Kerin Ransom PA-C Beeper [phone] 09/03/2013, 8:48 AM   Attending Note:   The patient was seen and examined.  Agree with assessment and plan as noted above.  Changes made to the above note as needed.  She is doing ok Her BP dropped last night.  I suspect that she is volume depleted and would benefit from some IV NS.  This will also help minimize the chance of contrast induced nephropathy.   For cath with possible PCI today.   Thayer Headings, Brooke Bonito., MD, [facility] 09/03/2013, 9:04 AM [address]  [place] Pager [phone]

## 2013-09-03 NOTE — H&P (View-Only) (Signed)
    Subjective:  No chest pain overnight  Objective:  Vital Signs in the last 24 hours: Temp:  [97.1 F (36.2 C)-98.7 F (37.1 C)] 97.6 F (36.4 C) (08/14 0555) Pulse Rate:  [47-102] 49 (08/14 0555) Resp:  [13-18] 18 (08/14 0555) BP: (72-124)/(42-64) 86/44 mmHg (08/14 0555) SpO2:  [95 %-100 %] 99 % (08/14 0555) Weight:  [122 lb 3.2 oz (55.43 kg)-122 lb 12.7 oz (55.7 kg)] 122 lb 12.7 oz (55.7 kg) (08/14 0619)  Intake/Output from previous day:  Intake/Output Summary (Last 24 hours) at 09/03/13 0848 Last data filed at 09/03/13 0844  Gross per 24 hour  Intake    480 ml  Output    925 ml  Net   -445 ml    Physical Exam: General appearance: alert, cooperative and no distress Lungs: clear to auscultation bilaterally Heart: regular rate and rhythm   Rate: 42-60  Rhythm: normal sinus rhythm and sinus bradycardia  Lab Results:  Recent Labs  09/02/13 0835  WBC 7.0  HGB 10.6*  PLT 297    Recent Labs  09/02/13 0835 09/03/13 0032  NA 139 141  K 3.8 4.3  CL 101 105  CO2 23 23  GLUCOSE 107* 98  BUN 22 24*  CREATININE 1.35* 1.48*    Recent Labs  09/02/13 1905 09/03/13 0032  TROPONINI 0.41* 0.32*   No results found for this basename: INR,  in the last 72 hours  Imaging: Imaging results have been reviewed  Cardiac Studies:  Assessment/Plan:  58 y.o. female with a PMH for severe ICM (EF 20-25%), extensive CAD with chronic total occlusion of the LAD artery and moderate stenosis in a large OM2 artery and severe disease in a small posterolateral ventricular artery. She has a very broad left bundle branch block, history of hypertension, PAF, and hyperlipidemia.. She has a history of previous stroke with some residual left hemiparesis. Also has a history of chronic alcohol abuse and acute pancreatitis. Currently followed in the heart failure clinic for titration of heart failure medications and consideration of future defibrillator implantation. She reports she has been  alcohol free. As an OP her SCr was elevated and her medications were being adjusted. She was admitted 09/02/13 with [place] as well as an elevated BNP-1566- though no SOB.    Principal Problem:   Unstable angina pectoris Active Problems:   Elevated troponin   Chronic systolic CHF (congestive heart failure)   PAF (paroxysmal atrial fibrillation)   Cardiomyopathy, ischemic- EF 25-30% 2D 09/02/13   CAD- total LAD- residual OM2 disease   Acute on chronic renal insufficiency   Hypertension    PLAN: Chest pain with elevated Troponin-cath today.  Acute on chronic renal insufficieny, her medications were being adjusted as an OP. BNP elevated though she denies dyspnea.   Kerin Ransom PA-C Beeper [phone] 09/03/2013, 8:48 AM   Attending Note:   The patient was seen and examined.  Agree with assessment and plan as noted above.  Changes made to the above note as needed.  She is doing ok Her BP dropped last night.  I suspect that she is volume depleted and would benefit from some IV NS.  This will also help minimize the chance of contrast induced nephropathy.   For cath with possible PCI today.   Thayer Headings, Brooke Bonito., MD, [facility] 09/03/2013, 9:04 AM [address]  [place] Pager [phone]

## 2013-09-03 NOTE — Progress Notes (Signed)
Site area: left groin Site Prior to Removal:  Level 0 Pressure Applied For: 20 Manual:   yes Patient Status During Pull:  stable Post Pull Site:  Level 0 Post Pull Instructions Given:  yes Post Pull Pulses Present: yes Dressing Applied:  yes Bedrest begins @ [id number] Comments: no complications

## 2013-09-03 NOTE — Progress Notes (Signed)
Pt c/o abd pain post-cath, pain started after cath, was also hypotensive earlier but that has improved.   Will ck stat H&H, abd CT to eval for bleed. Pt is currently not on anticoagulation. Continue to monitor closely.

## 2013-09-03 NOTE — CV Procedure (Addendum)
     Left Heart Catheterization with Coronary Angiography  Report  [name]  58 y.o.  female 01-Dec-1955  Procedure Date: 09/03/2013 Referring Physician: Sallyanne Kuster, MD Primary Cardiologist: Croitoru MD  INDICATIONS: Non-ST elevation MI  PROCEDURE: 1. Left heart catheterization; 2. Coronary angiography; 3. Left ventriculography  CONSENT:  The risks, benefits, and details of the procedure were explained in detail to the patient. Risks including death, stroke, heart attack, kidney injury, allergy, limb ischemia, bleeding and radiation injury were discussed.  The patient verbalized understanding and wanted to proceed.  Informed written consent was obtained.  PROCEDURE TECHNIQUE:  After Xylocaine anesthesia an attempt a right radial approach was made going severe spasm in the vessel prevented progression. We then turned to the femoral approach. We were unable to cannulate the right femoral and then went to the left femoral where we were able to place a 5 French sheath using  the modified Seldinger technique.  Coronary angiography was done using a 5 F JL 4 and JR 4 catheter.  Left ventriculography was done using the JR 4 catheter and hand injection.   Digital images were reviewed and the case was terminated   CONTRAST:  Total of 90 cc.  COMPLICATIONS:  None   HEMODYNAMICS:  Aortic pressure 90/47 mmHg; LV pressure 91/3 mmHg; LVEDP 6 mm mercury  ANGIOGRAPHIC DATA:   The left main coronary artery is widely patent.  The left anterior descending artery is totally occluded proximally. The entire left coronary reconstituted by right to left collaterals..  The left circumflex artery is a large vessel. 3 obtuse marginal branches arise from it. The first and second marginal branches contain 50% proximal/ostial stenoses. The third obtuse marginal is small and free of any obstruction. Faint collaterals to the LAD are noted..  The right coronary artery is his dominant. After the origin of the PDA  there is segmental 90% stenosis in the continuation of the right coronary unchanged from the prior images. 3 left ventricular branches arise distally. The entire left coronary system fills by right to left collaterals. The ostium/proximal portion of the right coronary developed intense spasm during catheter engagement. The right radial also had intense spasm during our attempt to perform radial angiography.Marland Kitchen   LEFT VENTRICULOGRAM:  Left ventricular angiogram was done in the 30 RAO projection and revealed dilated left ventricular cavity with global hypokinesis and an ejection fraction of 30%. The wall motion pattern seems out of proportion to the degree of coronary disease.   IMPRESSIONS:  1. 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 LVEF is 25-30%. The patient is volume contracted based upon hemodynamics noted today. 2. Severe coronary disease with chronic total occlusion of the proximal LAD which is collateralized from both the circumflex and the right coronary. There is moderate obstructive obtuse marginal #1 and obtuse marginal #2 disease. There is severe disease in the  continuation of the right coronary beyond the PDA prior to the origin of 2 small to moderate-sized left ventricular branches. The anatomy is unchanged from the April 2015 angiogram    RECOMMENDATION:  I recommend a myocardial viability study. If there is significant anterior wall viability coronary artery bypass grafting should be considered. A second option would be CTO recanalization of the LAD.  The patient was hypotensive during today's procedure and appears to be volume contracted.  The distal RCA beyond the PDA origin and the obtuse marginal branches are unchanged from the prior study.

## 2013-09-04 DIAGNOSIS — N289 Disorder of kidney and ureter, unspecified: Secondary | ICD-10-CM

## 2013-09-04 DIAGNOSIS — N189 Chronic kidney disease, unspecified: Secondary | ICD-10-CM

## 2013-09-04 DIAGNOSIS — I5021 Acute systolic (congestive) heart failure: Secondary | ICD-10-CM

## 2013-09-04 LAB — HEMOGLOBIN AND HEMATOCRIT, BLOOD
HEMATOCRIT: 30.2 % — AB (ref 36.0–46.0)
Hemoglobin: 9.8 g/dL — ABNORMAL LOW (ref 12.0–15.0)

## 2013-09-04 NOTE — Progress Notes (Signed)
Patient Name: [name] Date of Encounter: 09/04/2013  Principal Problem:   Unstable angina pectoris Active Problems:   Hypertension   Chronic systolic CHF (congestive heart failure)   Elevated troponin   PAF (paroxysmal atrial fibrillation)   Cardiomyopathy, ischemic- EF 25-30% 2D 09/02/13   CAD- total LAD- residual OM2 disease   Acute on chronic renal insufficiency   Length of Stay: 2  SUBJECTIVE  No further abdominal or chest pain, H/H stable. No retroperitoneal hematoma on CT Lying flat in bed without orthopnea.  CURRENT MEDS . aspirin EC  81 mg Oral Daily  . atorvastatin  40 mg Oral q1800  . carvedilol  3.125 mg Oral Q breakfast  . carvedilol  6.25 mg Oral QAC supper  . digoxin  0.0625 mg Oral Daily  . levothyroxine  12.5 mcg Oral QAC breakfast  . lisinopril  5 mg Oral QHS    OBJECTIVE   Intake/Output Summary (Last 24 hours) at 09/04/13 1240 Last data filed at 09/04/13 1042  Gross per 24 hour  Intake   1020 ml  Output    300 ml  Net    720 ml   Filed Weights   09/02/13 1301 09/03/13 0619 09/04/13 0650  Weight: 122 lb 3.2 oz (55.43 kg) 122 lb 12.7 oz (55.7 kg) 125 lb 10.6 oz (57 kg)    PHYSICAL EXAM Filed Vitals:   09/03/13 2228 09/04/13 0220 09/04/13 0650 09/04/13 1003  BP: 109/60 90/45 113/71   Pulse: 70 51 53 62  Temp: 98 F (36.7 C) 98.4 F (36.9 C) 97.5 F (36.4 C)   TempSrc: Oral Oral Oral   Resp: 20 20 20    Height:      Weight:   125 lb 10.6 oz (57 kg)   SpO2: 97% 95% 100%    General: Alert, oriented x3, no distress Head: no evidence of trauma, PERRL, EOMI, no exophtalmos or lid lag, no myxedema, no xanthelasma; normal ears, nose and oropharynx Neck: normal jugular venous pulsations and no hepatojugular reflux; brisk carotid pulses without delay and no carotid bruits Chest: clear to auscultation, no signs of consolidation by percussion or palpation, normal fremitus, symmetrical and full respiratory excursions Cardiovascular: normal  position and quality of the apical impulse, regular rhythm, normal first and second heart sounds, no rubs or gallops, no murmur Abdomen: no tenderness or distention, no masses by palpation, no abnormal pulsatility or arterial bruits, normal bowel sounds, no hepatosplenomegaly Extremities: no clubbing, cyanosis or edema; 2+ radial, ulnar and brachial pulses bilaterally; 2+ right femoral, posterior tibial and dorsalis pedis pulses; 2+ left femoral, posterior tibial and dorsalis pedis pulses; no subclavian or femoral bruits Neurological: grossly nonfocal  LABS  CBC  Recent Labs  09/02/13 0835 09/03/13 2238 09/04/13 1018  WBC 7.0  --   --   HGB 10.6* 9.7* 9.8*  HCT 32.7* 30.1* 30.2*  MCV 89.8  --   --   PLT 297  --   --    Basic Metabolic Panel  Recent Labs  09/02/13 0835 09/03/13 0032  NA 139 141  K 3.8 4.3  CL 101 105  CO2 23 23  GLUCOSE 107* 98  BUN 22 24*  CREATININE 1.35* 1.48*  CALCIUM 9.1 9.0   Liver Function Tests No results found for this basename: AST, ALT, ALKPHOS, BILITOT, PROT, ALBUMIN,  in the last 72 hours No results found for this basename: LIPASE, AMYLASE,  in the last 72 hours Cardiac Enzymes  Recent Labs  09/02/13 1405 09/02/13 1905  09/03/13 0032  TROPONINI 0.36* 0.41* 0.32*   BNP No components found with this basename: POCBNP,  D-Dimer No results found for this basename: DDIMER,  in the last 72 hours Hemoglobin A1C No results found for this basename: HGBA1C,  in the last 72 hours Fasting Lipid Panel  Recent Labs  09/03/13 0032  CHOL 159  HDL 32*  LDLCALC 92  TRIG 177*  CHOLHDL 5.0   Thyroid Function Tests No results found for this basename: TSH, T4TOTAL, FREET3, T3FREE, THYROIDAB,  in the last 72 hours  Radiology Studies Imaging results have been reviewed and Ct Abdomen Pelvis Wo Contrast  09/04/2013   CLINICAL DATA:  Left groin pain, status post catheterization.  EXAM: CT ABDOMEN AND PELVIS WITHOUT CONTRAST  TECHNIQUE:  Multidetector CT imaging of the abdomen and pelvis was performed following the standard protocol without IV contrast.  COMPARISON:  CT of the abdomen and pelvis performed 06/19/2013  FINDINGS: Minimal bibasilar atelectasis or scarring is noted. Scattered coronary artery calcification is seen.  The liver and spleen are unremarkable in appearance. The gallbladder is decompressed and within normal limits. Scattered calcification is again noted at the proximal body of the pancreas, relatively stable from the prior study and likely reflecting changes of chronic pancreatitis. No definite mass is seen. The adrenal glands are unremarkable in appearance.  The kidneys are unremarkable in appearance. There is no evidence of hydronephrosis. Contrast is noted within the renal calyces and pelvis bilaterally, reflecting recent catheterization. This limits evaluation for renal or ureteral stones. No perinephric stranding is appreciated.  No free fluid is identified. A bowel suture line at the right lower quadrant is grossly unremarkable in appearance. There is no evidence for obstruction. The stomach is within normal limits. No acute vascular abnormalities are seen. Scattered calcification is seen along the abdominal aorta and its branches.  The appendix is normal in caliber and contains trace of air without evidence for appendicitis.  Mild residual soft tissue inflammation about the cecum is markedly improved from the prior study. There is diffuse fatty infiltration within the wall of the cecum. Scattered diverticulosis is noted along the ascending colon, without evidence of diverticulitis. Minimal diverticulosis is also noted along the sigmoid colon.  The bladder is mildly distended and filled with contrast. The uterus is grossly unremarkable in appearance. The ovaries are relatively symmetric. No suspicious adnexal masses are seen. No inguinal lymphadenopathy is seen.  No focal abnormality is seen at the left inguinal region.  Deformity in the contour of the soft tissues at the left inguinal region reflects an overlying bandage.  There is mild soft tissue inflammation at the right inguinal region, with a mildly inflamed right inguinal node that remains normal in size. This is of uncertain significance; per clinical correlation, no recent procedure has been performed on the right side.  No acute osseous abnormalities are identified.  IMPRESSION: 1. No focal abnormality noted at the left inguinal region. 2. Mild soft tissue inflammation at the right inguinal region may reflect mild bruising, with a mildly inflamed right inguinal node that remains normal in size. This is of uncertain significance; per clinical correlation, no recent procedure has been performed on the right side. 3. Scattered coronary artery calcifications noted. 4. Scattered calcifications seen along the abdominal aorta and its branches. 5. Scattered diverticulosis along the ascending colon and minimal diverticulosis at the sigmoid colon, without evidence of diverticulitis. Mild residual soft tissue inflammation about the cecum is markedly improved from the prior study. 6. Scattered calcification  at the proximal body of the pancreas likely reflects changes of chronic pancreatitis. No definite mass seen.   Electronically Signed   By: Garald Balding M.D.   On: 09/04/2013 00:05    TELE NSR   ASSESSMENT AND PLAN For viability study on Monday in anticipation of possible CABG versus CTO-LAD PCI.  Sanda Klein, MD, [facility] [phone] office [phone] pager 09/04/2013 12:40 PM

## 2013-09-04 NOTE — Plan of Care (Signed)
Problem: Phase I Progression Outcomes Goal: Voiding-avoid urinary catheter unless indicated Outcome: Progressing Voiding without difficulty.

## 2013-09-04 NOTE — Plan of Care (Signed)
Problem: Phase I Progression Outcomes Goal: Distal pulses equal to baseline Outcome: Progressing Pulses strong and palpable.

## 2013-09-05 DIAGNOSIS — F17200 Nicotine dependence, unspecified, uncomplicated: Secondary | ICD-10-CM

## 2013-09-05 DIAGNOSIS — I4891 Unspecified atrial fibrillation: Secondary | ICD-10-CM

## 2013-09-05 NOTE — Plan of Care (Signed)
Problem: Phase I Progression Outcomes Goal: Voiding-avoid urinary catheter unless indicated Outcome: Progressing Adequate urine output.

## 2013-09-05 NOTE — Plan of Care (Signed)
Problem: Phase II Progression Outcomes Goal: Hemodynamically stable Outcome: Progressing BP remains stable, although, soft at times.

## 2013-09-05 NOTE — Progress Notes (Signed)
Patient Name: [name] Date of Encounter: 09/05/2013  Principal Problem:   Unstable angina pectoris Active Problems:   Hypertension   Chronic systolic CHF (congestive heart failure)   Elevated troponin   PAF (paroxysmal atrial fibrillation)   Cardiomyopathy, ischemic- EF 25-30% 2D 09/02/13   CAD- total LAD- residual OM2 disease   Acute on chronic renal insufficiency   Length of Stay: 3  SUBJECTIVE  Feels well - no further CP and no dyspnea at rest or walking in room  CURRENT MEDS . aspirin EC  81 mg Oral Daily  . atorvastatin  40 mg Oral q1800  . carvedilol  3.125 mg Oral Q breakfast  . carvedilol  6.25 mg Oral QAC supper  . digoxin  0.0625 mg Oral Daily  . levothyroxine  12.5 mcg Oral QAC breakfast  . lisinopril  5 mg Oral QHS    OBJECTIVE   Intake/Output Summary (Last 24 hours) at 09/05/13 1040 Last data filed at 09/05/13 0849  Gross per 24 hour  Intake    820 ml  Output    750 ml  Net     70 ml   Filed Weights   09/03/13 0619 09/04/13 0650 09/05/13 0500  Weight: 122 lb 12.7 oz (55.7 kg) 125 lb 10.6 oz (57 kg) 124 lb 9 oz (56.5 kg)    PHYSICAL EXAM Filed Vitals:   09/05/13 0100 09/05/13 0131 09/05/13 0500 09/05/13 1038  BP: 59/35 100/60 98/50   Pulse: 56  56 62  Temp: 97.9 F (36.6 C)  97.7 F (36.5 C)   TempSrc: Oral  Oral   Resp: 20  20   Height:      Weight:   124 lb 9 oz (56.5 kg)   SpO2: 98%  96%    General: Alert, oriented x3, no distress  Head: no evidence of trauma, PERRL, EOMI, no exophtalmos or lid lag, no myxedema, no xanthelasma; normal ears, nose and oropharynx  Neck: normal jugular venous pulsations and no hepatojugular reflux; brisk carotid pulses without delay and no carotid bruits  Chest: clear to auscultation, no signs of consolidation by percussion or palpation, normal fremitus, symmetrical and full respiratory excursions  Cardiovascular: normal position and quality of the apical impulse, regular rhythm, normal first and second  heart sounds, no rubs or gallops, no murmur  Abdomen: no tenderness or distention, no masses by palpation, no abnormal pulsatility or arterial bruits, normal bowel sounds, no hepatosplenomegaly  Extremities: no clubbing, cyanosis or edema; 2+ radial, ulnar and brachial pulses bilaterally; 2+ right femoral, posterior tibial and dorsalis pedis pulses; 2+ left femoral, posterior tibial and dorsalis pedis pulses; no subclavian or femoral bruits  Neurological: grossly nonfocal   LABS  CBC  Recent Labs  09/03/13 2238 09/04/13 1018  HGB 9.7* 9.8*  HCT 30.1* 76.2*   Basic Metabolic Panel  Recent Labs  09/03/13 0032  NA 141  K 4.3  CL 105  CO2 23  GLUCOSE 98  BUN 24*  CREATININE 1.48*  CALCIUM 9.0   Liver Function Tests No results found for this basename: AST, ALT, ALKPHOS, BILITOT, PROT, ALBUMIN,  in the last 72 hours No results found for this basename: LIPASE, AMYLASE,  in the last 72 hours Cardiac Enzymes  Recent Labs  09/02/13 1405 09/02/13 1905 09/03/13 0032  TROPONINI 0.36* 0.41* 0.32*   BNP No components found with this basename: POCBNP,  D-Dimer No results found for this basename: DDIMER,  in the last 72 hours Hemoglobin A1C No results found for  this basename: HGBA1C,  in the last 72 hours Fasting Lipid Panel  Recent Labs  09/03/13 0032  CHOL 159  HDL 32*  LDLCALC 92  TRIG 177*  CHOLHDL 5.0   Thyroid Function Tests No results found for this basename: TSH, T4TOTAL, FREET3, T3FREE, THYROIDAB,  in the last 72 hours  Radiology Studies Reviewed  ASSESSMENT AND PLAN  For viability study in AM to decide whether high risk LAD CTO PCI or CABG is warranted.   Sanda Klein, MD, [facility] [phone] office [phone] pager 09/05/2013 10:40 AM

## 2013-09-05 NOTE — Progress Notes (Signed)
Patient is oriented x 4. Denies complaint of pain/discomfort at present time. Left femoral site dressing s/p cardiac cath is clean, dry and intact. Encouraged patient to notify this RN if bleeding is noted at the site or for any addditional needs/concerns, to which she verbalized understanding. Will continue to monitor.  Esperanza Heir, RN

## 2013-09-06 ENCOUNTER — Inpatient Hospital Stay (HOSPITAL_COMMUNITY): Payer: Medicaid Other

## 2013-09-06 DIAGNOSIS — I2510 Atherosclerotic heart disease of native coronary artery without angina pectoris: Secondary | ICD-10-CM

## 2013-09-06 LAB — BASIC METABOLIC PANEL
Anion gap: 11 (ref 5–15)
BUN: 14 mg/dL (ref 6–23)
CALCIUM: 9.6 mg/dL (ref 8.4–10.5)
CO2: 25 mEq/L (ref 19–32)
CREATININE: 1.13 mg/dL — AB (ref 0.50–1.10)
Chloride: 110 mEq/L (ref 96–112)
GFR calc Af Amer: 61 mL/min — ABNORMAL LOW (ref >90)
GFR, EST NON AFRICAN AMERICAN: 53 mL/min — AB (ref >90)
Glucose, Bld: 86 mg/dL (ref 70–99)
Potassium: 4.5 mEq/L (ref 3.7–5.3)
Sodium: 146 mEq/L (ref 137–147)

## 2013-09-06 LAB — DIGOXIN LEVEL: Digoxin Level: 0.9 ng/mL (ref 0.8–2.0)

## 2013-09-06 MED ORDER — GADOBENATE DIMEGLUMINE 529 MG/ML IV SOLN
20.0000 mL | Freq: Once | INTRAVENOUS | Status: AC | PRN
  Administered 2013-09-06: 20 mL via INTRAVENOUS

## 2013-09-06 MED ORDER — LORAZEPAM 0.5 MG PO TABS
0.5000 mg | ORAL_TABLET | ORAL | Status: DC | PRN
  Administered 2013-09-06 – 2013-09-07 (×4): 0.5 mg via ORAL
  Filled 2013-09-06 (×4): qty 1

## 2013-09-06 NOTE — Progress Notes (Signed)
Pt.A/Ox4 and is ambulatory without assistance. She had no c/o pain and no signs of distress. Called PA this am about pt.'s po Digoxin. Pt.HR was 52 and pt.was asymptomatic. Was told it was okay to give and that Digoxin level would be drawn.

## 2013-09-06 NOTE — Progress Notes (Signed)
Patient is alert and oriented x 4. Denies c/o pain/discomfort at present time. Education provided to patient with regards to being NPO after midnight for a cardiac stress test with thaliam tomorrow morning, to which she verbalized understanding. Patient is Sinus brady in the 50s on the monitor. Encouraged patient to call with any additional needs, to which she verbalized understanding. Will continue to monitor.  Esperanza Heir, RN

## 2013-09-06 NOTE — Progress Notes (Signed)
Patient Name: [name] Date of Encounter: 09/06/2013 Cardiologist: Dr. Tamala Julian    Principal Problem:   Unstable angina pectoris Active Problems:   Hypertension   Chronic systolic CHF (congestive heart failure)   Elevated troponin   PAF (paroxysmal atrial fibrillation)   Cardiomyopathy, ischemic- EF 25-30% 2D 09/02/13   CAD- total LAD- residual OM2 disease   Acute on chronic renal insufficiency    SUBJECTIVE  No CP or SOB overnight. Had a total of 3 episodes of CP in last 6 month.   CURRENT MEDS . aspirin EC  81 mg Oral Daily  . atorvastatin  40 mg Oral q1800  . carvedilol  3.125 mg Oral Q breakfast  . carvedilol  6.25 mg Oral QAC supper  . digoxin  0.0625 mg Oral Daily  . levothyroxine  12.5 mcg Oral QAC breakfast  . lisinopril  5 mg Oral QHS    OBJECTIVE  Filed Vitals:   09/05/13 2100 09/06/13 0242 09/06/13 0600 09/06/13 0603  BP: 94/50 92/48 79/48  97/56  Pulse: 80 55 57 54  Temp: 98.1 F (36.7 C) 98 F (36.7 C) 97.7 F (36.5 C)   TempSrc: Oral  Oral   Resp: 20 18 18 18   Height:      Weight:    124 lb 12.5 oz (56.6 kg)  SpO2: 95% 98% 97% 98%    Intake/Output Summary (Last 24 hours) at 09/06/13 0850 Last data filed at 09/06/13 0743  Gross per 24 hour  Intake    840 ml  Output    200 ml  Net    640 ml   Filed Weights   09/04/13 0650 09/05/13 0500 09/06/13 0603  Weight: 125 lb 10.6 oz (57 kg) 124 lb 9 oz (56.5 kg) 124 lb 12.5 oz (56.6 kg)    PHYSICAL EXAM  General: Pleasant, NAD. Neuro: Alert and oriented X 3. Moves all extremities spontaneously. Psych: Normal affect. HEENT:  Normal  Neck: Supple without bruits or JVD. Lungs:  Resp regular and unlabored, CTA. Heart: RRR no s3, s4, or murmurs. Abdomen: Soft, non-tender, non-distended, BS + x 4.  Extremities: No clubbing, cyanosis or edema. DP/PT/Radials 2+ and equal bilaterally.  Accessory Clinical Findings  CBC  Recent Labs  09/03/13 2238 09/04/13 1018  HGB 9.7* 9.8*  HCT 30.1* 30.2*      TELE  Not on tele  ECG  SInus rhythm with LBBB  Radiology/Studies   Dg Chest Port 1 View  09/02/2013   CLINICAL DATA:  Chest pain  EXAM: PORTABLE CHEST - 1 VIEW  COMPARISON:  Jun 05, 2013  FINDINGS: There is no edema or consolidation. The heart size and pulmonary vascularity are normal. No pneumothorax. No adenopathy. No bone lesions.  IMPRESSION: No abnormality noted.   Electronically Signed   By: Lowella Grip M.D.   On: 09/02/2013 08:47    ASSESSMENT AND PLAN  1. Unstable angina  - cath 09/03/2013 chronic total occlusion of prox LAD which is collateralized from both the LCx and RCA, residual OM dx  - pending myocardial viability test to eval possible CABG or CTO of LAD  - Addendum: informed by nurse that unable to do viability test today due to lack of medication (?thallium), plan for tomorrow  2. CAD  - scattered coronary and aortic calcification noted on recent CT abdomen and pelvis  3. 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 CKD 5. Anemia, normal hgb 2 mo ago, now stable 9.7 6. PAF   Signed, Almyra Deforest PA-C Pager:  [phone]  Personally seen and examined. Agree with above. We will actually pursue cardiac MRI for viability. I discussed with Dr. Meda Coffee. I discussed with patient. Anxiolytic for claustrophobia.  Candee Furbish, MD

## 2013-09-07 MED ORDER — ACETAMINOPHEN 325 MG PO TABS: 650.0000 mg | ORAL_TABLET | ORAL | Status: DC | PRN

## 2013-09-07 MED ORDER — ASPIRIN 81 MG PO TBEC: 81.0000 mg | DELAYED_RELEASE_TABLET | Freq: Every day | ORAL | Status: DC

## 2013-09-07 MED ORDER — NITROGLYCERIN 0.4 MG SL SUBL: 0.4000 mg | SUBLINGUAL_TABLET | SUBLINGUAL | Status: DC | PRN

## 2013-09-07 NOTE — Progress Notes (Signed)
    Subjective:  Feels normal. No CP, no SOB, no PND, no orthopnea.   Objective:  Vital Signs in the last 24 hours: Temp:  [97.3 F (36.3 C)-98.2 F (36.8 C)] 98.2 F (36.8 C) (08/18 0524) Pulse Rate:  [50-64] 60 (08/18 0537) Resp:  [16-18] 18 (08/18 0524) BP: (89-137)/(53-89) 90/58 mmHg (08/18 0537) SpO2:  [98 %-100 %] 98 % (08/18 0524) Weight:  [124 lb 14.4 oz (56.654 kg)] 124 lb 14.4 oz (56.654 kg) (08/18 0524)  Intake/Output from previous day: 08/17 0701 - 08/18 0700 In: 240 [P.O.:240] Out: 450 [Urine:450]   Physical Exam: General: Thin, in no acute distress. Head:  Normocephalic and atraumatic. No JVD Lungs: Clear to auscultation and percussion. Heart: Normal S1 and S2.  No murmur, rubs or gallops.  Abdomen: soft, non-tender, positive bowel sounds. Extremities: No clubbing or cyanosis. No edema. Neurologic: Alert and oriented x 3.    Lab Results:  Recent Labs  09/04/13 1018  HGB 9.8*    Recent Labs  09/06/13 1213  NA 146  K 4.5  CL 110  CO2 25  GLUCOSE 86  BUN 14  CREATININE 1.13*   Telemetry: No adverse arrhythmias Personally viewed.   EKG:  Intraventricular conduction delay  Cardiac Studies:  Cardiac MRI read pending  Scheduled Meds: . aspirin EC  81 mg Oral Daily  . atorvastatin  40 mg Oral q1800  . carvedilol  3.125 mg Oral Q breakfast  . carvedilol  6.25 mg Oral QAC supper  . digoxin  0.0625 mg Oral Daily  . levothyroxine  12.5 mcg Oral QAC breakfast  . lisinopril  5 mg Oral QHS   Continuous Infusions:  PRN Meds:.acetaminophen, LORazepam, nitroGLYCERIN, ondansetron (ZOFRAN) IV, ondansetron (ZOFRAN) IV  Assessment/Plan:  Principal Problem:   Unstable angina pectoris Active Problems:   Hypertension   Chronic systolic CHF (congestive heart failure)   Elevated troponin   PAF (paroxysmal atrial fibrillation)   Cardiomyopathy, ischemic- EF 25-30% 2D 09/02/13   CAD- total LAD- residual OM2 disease   Acute on chronic renal  insufficiency  58 year old with coronary artery disease, occlusion of LAD with chronic systolic heart failure, ischemic cardiomyopathy.  1. Coronary artery disease/ischemic cardiac myopathy-checking cardiac MRI for viability. If LAD territory is viable, contemplate CTO procedure percutaneously versus bypass. Awaiting results of MRI.  2. Chronic systolic heart failure-ejection fraction 20-25%. - Currently on beta blocker, ACE inhibitor. Continue. Unable to up titrate medications secondary to hypotension. Most recent blood pressure 90/58.  3. Chronic kidney disease stage 2 - creatinine has improved to this admission. Was 1.48, now 1.13. Peak creatinine was 2.04 on 08/17/13. Avoid NSAIDs. Currently on ACE inhibitor.  4. PAF - currently normal sinus rhythm.  5. Mixed cardiomyopathy  - as above.  6. Anemia-stable hemoglobin 9.8.  Awaiting results of MRI. We will proceed based upon this result. If scar, continue with medical management. If viability is present, we will discuss with interventional cardiology/CT surgery.  Labria Wos, [place] 09/07/2013, 8:27 AM

## 2013-09-07 NOTE — Plan of Care (Signed)
Problem: Phase I Progression Outcomes Goal: EF % per last Echo/documented,Core Reminder form on chart Outcome: Completed/Met Date Met:  09/07/13 EF result 25-30%  Performed on 09/02/2013     

## 2013-09-07 NOTE — Progress Notes (Signed)
Notified Dr. Aundra Dubin of low blood pressure of 88/53 when taken by the Nurse Technician, rechecked manually and was 90/58.

## 2013-09-07 NOTE — Discharge Instructions (Signed)

## 2013-09-07 NOTE — Discharge Summary (Signed)
Patient ID: Devlyn Parish,  MRN: [id number], DOB/AGE: [birth year] 58 y.o.  Admit date: 09/02/2013 Discharge date: 09/07/2013  Primary Care Provider:  Primary Cardiologist: CHF clinic  Discharge Diagnoses Principal Problem:   Unstable angina pectoris Active Problems:   Elevated troponin   Chronic systolic CHF (congestive heart failure)   PAF (paroxysmal atrial fibrillation)   Cardiomyopathy, ischemic- EF 25-30% 2D 09/02/13   CAD- total LAD- residual OM2 disease   Acute on chronic renal insufficiency   Hypertension    Procedures:  Cath 09/03/13                         Cardiac MRI 09/07/13   Hospital Course:   58 y.o. female with a PMH for severe ICM (EF 20-25%), extensive CAD with chronic total occlusion of the LAD artery and moderate stenosis in a large second oblique marginal artery and severe disease in a small posterolateral ventricular artery. She was last admitted with congestive heart failure in April 2015. She had paroxysmal fibrillation with rapid RVR in May 2015. She has a very broad LBBB, a history of HTN, hyperlipidemia, and previous stroke with some residual left hemiparesis. She also has a history of chronic alcohol abuse and acute pancreatitis. She has been abstinent from alcohol and tobacco since the spring  She is currently followed in the heart failure clinic for titration of heart failure medications and consideration of future defibrillator implantation. Her home "dry weight" seems to be around 125 pounds.                She was admitted 09/02/13 with Lt chest pain, SOB, nausea and vomiting x 1. NTG seemed to help. She was admitted from the ER for further evaluation. EF by echo was 25-30%. Her Troponin was elevated and it was decided to proceed with cath. This was done 09/03/13 and revealedchronic total occlusion of the proximal LAD which is collateralized from both the circumflex and the right coronary. There was moderate obstructive obtuse marginal #1 and obtuse marginal  #2 disease. There was severe disease in the continuation of the right coronary beyond the PDA prior to the origin of 2 small to moderate-sized left ventricular branches. The anatomy was unchanged from the April 2015 angiogram. It was decided to proceed with a Cardiac MRI for viability. This was done 09/07/13 and review by Dr Marlou Porch and Dr Meda Coffee. This revealed that the LAD territory was not viable. The plan is for continued medical Rx.       Discharge Vitals:  Blood pressure 80/40, pulse 53, temperature 97.9 F (36.6 C), temperature source Oral, resp. rate 17, height 5\' 3"  (1.6 m), weight 124 lb 14.4 oz (56.654 kg), SpO2 97.00%.    Labs: No results found for this or any previous visit (from the past 24 hour(s)).  Disposition:  Follow-up Information   Follow up with [facility] On 09/21/2013. (Keep 9:15 apt)    Specialty:  Cardiology   Contact information:   [address], [place] [address] [phone]      Discharge Medications:    Medication List    TAKE these medications       acetaminophen 325 MG tablet  Commonly known as:  TYLENOL  Take 2 tablets (650 mg total) by mouth every 4 (four) hours as needed for headache or mild pain.     atorvastatin 80 MG tablet  Commonly known as:  LIPITOR  Take 0.5 tablets (  40 mg total) by mouth daily at 6 PM.     carvedilol 3.125 MG tablet  Commonly known as:  COREG  Take 3.125-6.25 mg by mouth 2 (two) times daily with a meal. 3.125mg  in the morning and 6.25mg  in the evening     digoxin 0.125 MG tablet  Commonly known as:  LANOXIN  Take 0.5 tablets (0.0625 mg total) by mouth daily.     levothyroxine 25 MCG tablet  Commonly known as:  SYNTHROID, LEVOTHROID  Take 0.5 tablets (12.5 mcg total) by mouth daily before breakfast.     lisinopril 5 MG tablet  Commonly known as:  PRINIVIL,ZESTRIL  Take 1 tablet (5 mg total) by mouth at bedtime.     nitroGLYCERIN 0.4 MG SL tablet  Commonly known as:   NITROSTAT  Place 1 tablet (0.4 mg total) under the tongue every 5 (five) minutes as needed for chest pain.     rivaroxaban 20 MG Tabs tablet  Commonly known as:  XARELTO  Take 1 tablet (20 mg total) by mouth daily with supper.      ASK your doctor about these medications       aspirin 81 MG chewable tablet  Chew 1 tablet (81 mg total) by mouth daily.  Ask about: Which instructions should I use?     aspirin 81 MG EC tablet  Take 1 tablet (81 mg total) by mouth daily.  Ask about: Which instructions should I use?         Duration of Discharge Encounter: Greater than 30 minutes including physician time.  Angelena Form PA-C 09/07/2013 3:58 PM

## 2013-09-07 NOTE — Discharge Summary (Signed)
Personally seen and examined. Agree with above. Nonviable LAD territory.  Candee Furbish, MD

## 2013-09-07 NOTE — Progress Notes (Signed)
UR completed [name] Wirsing K. Maham Quintin, RN, BSN, MSHL, CCM  09/07/2013 4:00 PM

## 2013-09-07 NOTE — Progress Notes (Signed)
    Cardiac MRI reviewed with Dr. Meda Coffee. LAD territory is nonviable. No percutaneous intervention/bypass.  Continue with medical management of her heart failure. I discussed findings with Dr. Aundra Dubin.   She will followup in heart failure clinic. 1-2 weeks.   Okay with discharge.  Candee Furbish, MD

## 2013-09-07 NOTE — Progress Notes (Signed)
PHARMACIST - PHYSICIAN COMMUNICATION  CONCERNING:  DVT prophylaxis   RECOMMENDATION: Please consider adding DVT prophylaxis while awaiting further plan.    Thank you. Anette Guarneri, PharmD [phone]

## 2013-09-19 NOTE — Progress Notes (Signed)
Patient ID: [name], female   DOB: [birth year], 58 y.o.   MRN: [id number]  PCP: N/A Primary Cardiologist: Dr. Percival Spanish  HPI: [name] is a 58 y/o female with a history of ETOH abuse, ICM, HTN, stroke and chronic systolic heart failure.  Admitted to [facility] 05/06/13 for pancreatitis. She had severely reduced systolic function with an EF of 20-25%. 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 Discharge weight was 140 pounds.   Admitted to [facility] 05/2013 with A fib RVR. Started on amiodarone 400 mg tiwce a day and Xarelto 20 mg daily. Chemically converted to NSR.  Admitted to [facility]  08/2013 wit chest pain. Troponin was elelvated. LHC was unchanged from April 2015 so she had CMRI that showed LAD territory was not viable. Plan to continue to treat medically.   Follow up for Heart Failure: In July spironolactone stopped due to elevated creatinine. Since that last visit she was readmitted August 13 th with chest pain. CMRI showed non viable LAD area. SOB with exertion. Mild CP with house work. Denies PND/Orthopnea. Weight at at home 125-126 pounds. Unable to walk to the back of the grocery store without multiple breaks.  Lives at the [facility] with her husband. Has not smoked since April 2015 or had alcohol. Now has Medicaid.   ECHO 09/02/13 EF 25-30%   Labs 05/15/13 K 3.7 Creatinine 0.97 Labs 06/02/13 K 3.9 Creatinine 1.0 Dig level 1.3 --> cut back dig to 0.0625 mg Labs 06/08/13 K 4.7 Creatinine 1.1 Dig level 0.4  Labs 06/19/13: K 4.1, creatinine 1.35 Labs 08/05/13: K 4.5, creatinine 1.17, AST 25, ALT 28, cholesterol 188, TG 185, HDL 41, LDL 110 Labs 08/17/13: K 4.9 Creatinine 2.0  Labs 09/06/13: Dig level 0.9 K 4.5 Creatinine 1.13  SH: Lives with her husband. Unemployed. Stopped smoking and drinking alcohol April 2015.  FH: Father MI at the age of 88  ROS: All systems negative except as listed in HPI, PMH and Problem  List.  Past Medical History  Diagnosis Date  . Diverticulosis   . Pancreatitis   . Hypertension   . Stroke 2011  . Hyperlipidemia   . Anxiety   . GERD (gastroesophageal reflux disease)   . ETOH abuse   . chronic systolic heart failure     a. ECHO (05/08/13): EF 20-25%, diff HK with akinesis of basal inferior wall, grade 1 DD, trivial MR, trivial TR b) RHC (05/06/13): RA 7, RV 30/2, PA 31/19 (24), PCWP 10, Fick CO/CI: 2.9/1.74, Thermo CO/Ci: 2.4/1.4, PA sat 53%  . Ischemic cardiomyopathy   . CAD (coronary artery disease)     a. LHC (04/2013): Chronically occluded LAD, moderate dz in large OM1 and severe dz and small posterior lateral vessel    . A-fib     Current Outpatient Prescriptions  Medication Sig Dispense Refill  . acetaminophen (TYLENOL) 325 MG tablet Take 2 tablets (650 mg total) by mouth every 4 (four) hours as needed for headache or mild pain.      Marland Kitchen aspirin 81 MG chewable tablet Chew 1 tablet (81 mg total) by mouth daily.      Marland Kitchen atorvastatin (LIPITOR) 80 MG tablet Take 0.5 tablets (40 mg total) by mouth daily at 6 PM.  30 tablet  6  . carvedilol (COREG) 3.125 MG tablet Take 3.125-6.25 mg by mouth 2 (two) times daily with a meal. 3.125mg  in the morning and 6.25mg  in the  evening      . digoxin (LANOXIN) 0.125 MG tablet Take 0.5 tablets (0.0625 mg total) by mouth daily.  15 tablet  3  . levothyroxine (SYNTHROID, LEVOTHROID) 25 MCG tablet Take 0.5 tablets (12.5 mcg total) by mouth daily before breakfast.  30 tablet  0  . lisinopril (PRINIVIL,ZESTRIL) 5 MG tablet Take 1 tablet (5 mg total) by mouth at bedtime.  30 tablet  3  . nitroGLYCERIN (NITROSTAT) 0.4 MG SL tablet Place 1 tablet (0.4 mg total) under the tongue every 5 (five) minutes as needed for chest pain.  25 tablet  2  . rivaroxaban (XARELTO) 20 MG TABS tablet Take 1 tablet (20 mg total) by mouth daily with supper.  30 tablet  3   No current facility-administered medications for this encounter.    Filed Vitals:    09/21/13 0907  BP: 109/70  Pulse: 71  Resp: 16  Weight: 127 lb 4 oz (57.72 kg)  SpO2: 99%    PHYSICAL EXAM: General:  Well appearing. No resp difficulty. Ambulated in the clinic with no difficulties.  HEENT: normal Neck: supple. JVP 5-6.  Carotids 2+ bilaterally; no bruits. No lymphadenopathy or thryomegaly appreciated. Cor: PMI normal. Regular rate & rhythm. No rubs, gallops or murmurs. Lungs: clear Abdomen: soft, nontender, nondistended. No hepatosplenomegaly. No bruits or masses. Good bowel sounds. Extremities: no cyanosis, clubbing, rash, edema Neuro: alert & orientedx3, cranial nerves grossly intact. Moves all 4 extremities w/o difficulty. Affect pleasant.  ASSESSMENT & PLAN:  1. Chronic Systolic Heart Failure: Primarily ICM thought ETOH may play a role.  EF 25-30%  (08/2013) Cardiac MRI - August 2015 No viability noted LAD territory. LBBB noted 08/2013 - NYHA II-III symptoms and volume status stable.  Restart 20 mg lasix as needed for weight 131 or greater.  -Continue coreg 3.125 mg q am and 6.25 mg q pm.Continue digoxin 0. 0625 mg daily ( dig level 0.9 08/2013)  - Continue lisinopril 5 mg at bed time.  Spironolactone stopped in June due to elevated K and creatinine. . -  EKG 09/03/13 QRS 167 . Refer to EP for CRT-D. Request [employer] for HF diagnostics.  Will need to consider CPX if she remains SOB at next visit but will see if CRT-D will help. May need advanced therapies down the road.  - Reinforced the need and importance of daily weights, a low sodium diet, and fluid restriction (less than 2 L a day). Instructed to call the HF clinic if weight increases more than 3 lbs overnight or 5 lbs in a week.   2. CAD: LHC with chronically occluded LAD, moderate disease in a large OM 2 and severe disease and small posterior lateral vessel. CMRI - no viability in LAD territory. Continue ASA, statin and BB. Add 30 mg Imdur daily at bed time.  3. Former Smoker: Quit April 2015. .   4. Former  Alcohol Abuse- Quit April 2015. .  5.  PAF- Appears to be in NSR today. Off amiodarone since July . She ran out of amiodarone and was not restarted.  Continue Xarelto 20 mg daily.     Follow up up 1 month wit Dr Aundra Dubin. Will need to consider CPX.   CLEGG,AMY NP-C 9:14 AM

## 2013-09-21 ENCOUNTER — Encounter (HOSPITAL_COMMUNITY): Payer: Self-pay

## 2013-09-21 ENCOUNTER — Ambulatory Visit (HOSPITAL_COMMUNITY)
Admission: RE | Admit: 2013-09-21 | Discharge: 2013-09-21 | Disposition: A | Discharge status: Home / Self Care | Payer: Medicaid Other | Source: Ambulatory Visit | Attending: Cardiology | Admitting: Cardiology

## 2013-09-21 VITALS — BP 109/70 | HR 71 | Resp 16 | Wt 127.2 lb

## 2013-09-21 DIAGNOSIS — I10 Essential (primary) hypertension: Secondary | ICD-10-CM | POA: Diagnosis not present

## 2013-09-21 DIAGNOSIS — I5022 Chronic systolic (congestive) heart failure: Secondary | ICD-10-CM

## 2013-09-21 DIAGNOSIS — I25119 Atherosclerotic heart disease of native coronary artery with unspecified angina pectoris: Secondary | ICD-10-CM

## 2013-09-21 DIAGNOSIS — I480 Paroxysmal atrial fibrillation: Secondary | ICD-10-CM

## 2013-09-21 DIAGNOSIS — I255 Ischemic cardiomyopathy: Secondary | ICD-10-CM

## 2013-09-21 DIAGNOSIS — Z8673 Personal history of transient ischemic attack (TIA), and cerebral infarction without residual deficits: Secondary | ICD-10-CM | POA: Diagnosis not present

## 2013-09-21 DIAGNOSIS — E785 Hyperlipidemia, unspecified: Secondary | ICD-10-CM | POA: Diagnosis not present

## 2013-09-21 DIAGNOSIS — I2510 Atherosclerotic heart disease of native coronary artery without angina pectoris: Secondary | ICD-10-CM | POA: Diagnosis not present

## 2013-09-21 DIAGNOSIS — Z7982 Long term (current) use of aspirin: Secondary | ICD-10-CM | POA: Diagnosis not present

## 2013-09-21 DIAGNOSIS — F1010 Alcohol abuse, uncomplicated: Secondary | ICD-10-CM | POA: Diagnosis not present

## 2013-09-21 DIAGNOSIS — I509 Heart failure, unspecified: Secondary | ICD-10-CM

## 2013-09-21 DIAGNOSIS — I4891 Unspecified atrial fibrillation: Secondary | ICD-10-CM | POA: Diagnosis not present

## 2013-09-21 DIAGNOSIS — I209 Angina pectoris, unspecified: Secondary | ICD-10-CM

## 2013-09-21 DIAGNOSIS — Z87891 Personal history of nicotine dependence: Secondary | ICD-10-CM | POA: Insufficient documentation

## 2013-09-21 DIAGNOSIS — I2589 Other forms of chronic ischemic heart disease: Secondary | ICD-10-CM | POA: Diagnosis not present

## 2013-09-21 MED ORDER — ISOSORBIDE MONONITRATE ER 30 MG PO TB24: 30.0000 mg | ORAL_TABLET | Freq: Every day | ORAL | Status: DC

## 2013-09-21 NOTE — Patient Instructions (Signed)
Follow up 1 month  Take imdur 30 mg daily  Do the following things EVERYDAY: 1) Weigh yourself in the morning before breakfast. Write it down and keep it in a log. 2) Take your medicines as prescribed 3) Eat low salt foods-Limit salt (sodium) to 2000 mg per day.  4) Stay as active as you can everyday 5) Limit all fluids for the day to less than 2 liters

## 2013-09-22 ENCOUNTER — Telehealth: Payer: Self-pay | Admitting: Licensed Clinical Social Worker

## 2013-09-22 NOTE — Telephone Encounter (Signed)
CSW received a return call from [facility] and wellness regarding PCP appointment for patient. CSW explained the circumstances and recent approval for medicaid with [employer] health and wellness listed as PCP. Patient scheduled for PCP visit this Friday September 24, 2013 at 11:30am. Raquel Sarna, [place]

## 2013-09-22 NOTE — Telephone Encounter (Signed)
CSW contacted patient to inform of appointment with PCP on Friday September 24, 2013 at 11:30am. Patient states that her friend will assist with transport and was very appreciative for obtaining the appointment. CSW also discussed referral to [facility] to assist with resources and wellness. Patient agreeable to referral and will contact CSW next week with outcome of PCP visit. Raquel Sarna, [place]

## 2013-09-24 ENCOUNTER — Ambulatory Visit: Payer: Medicaid Other | Attending: Internal Medicine | Admitting: Internal Medicine

## 2013-09-24 VITALS — BP 100/63 | HR 54 | Temp 97.9°F | Resp 16 | Ht 63.0 in | Wt 127.8 lb

## 2013-09-24 DIAGNOSIS — Z87891 Personal history of nicotine dependence: Secondary | ICD-10-CM | POA: Insufficient documentation

## 2013-09-24 DIAGNOSIS — Z129 Encounter for screening for malignant neoplasm, site unspecified: Secondary | ICD-10-CM | POA: Insufficient documentation

## 2013-09-24 DIAGNOSIS — I5022 Chronic systolic (congestive) heart failure: Secondary | ICD-10-CM | POA: Diagnosis not present

## 2013-09-24 DIAGNOSIS — I2589 Other forms of chronic ischemic heart disease: Secondary | ICD-10-CM | POA: Diagnosis not present

## 2013-09-24 DIAGNOSIS — I509 Heart failure, unspecified: Secondary | ICD-10-CM

## 2013-09-24 DIAGNOSIS — F411 Generalized anxiety disorder: Secondary | ICD-10-CM | POA: Insufficient documentation

## 2013-09-24 DIAGNOSIS — I2510 Atherosclerotic heart disease of native coronary artery without angina pectoris: Secondary | ICD-10-CM | POA: Diagnosis not present

## 2013-09-24 DIAGNOSIS — I4891 Unspecified atrial fibrillation: Secondary | ICD-10-CM | POA: Diagnosis not present

## 2013-09-24 DIAGNOSIS — E039 Hypothyroidism, unspecified: Secondary | ICD-10-CM | POA: Insufficient documentation

## 2013-09-24 DIAGNOSIS — I10 Essential (primary) hypertension: Secondary | ICD-10-CM | POA: Insufficient documentation

## 2013-09-24 DIAGNOSIS — Z7982 Long term (current) use of aspirin: Secondary | ICD-10-CM | POA: Insufficient documentation

## 2013-09-24 DIAGNOSIS — E785 Hyperlipidemia, unspecified: Secondary | ICD-10-CM | POA: Diagnosis not present

## 2013-09-24 DIAGNOSIS — Z7901 Long term (current) use of anticoagulants: Secondary | ICD-10-CM | POA: Insufficient documentation

## 2013-09-24 MED ORDER — LORAZEPAM 0.5 MG PO TABS
0.5000 mg | ORAL_TABLET | Freq: Two times a day (BID) | ORAL | Status: DC | PRN
Start: 2013-09-24 — End: 2013-10-13

## 2013-09-24 NOTE — Progress Notes (Signed)
Patient ID: [name], female   DOB: 02/07/1955, 58 y.o.   MRN: [id number]                                     [facility] Discharge Acute Issues Care Follow Up                                                                        Patient Demographics  [name], is a 58 y.o. female  DOB 10-22-1955  MRN [id number].  Primary MD  No PCP Per Patient   Reason for visit- to establish primary care   Past Medical History  Diagnosis Date  . Diverticulosis   . Pancreatitis   . Hypertension   . Stroke 2011  . Hyperlipidemia   . Anxiety   . GERD (gastroesophageal reflux disease)   . ETOH abuse   . chronic systolic heart failure     a. ECHO (05/08/13): EF 20-25%, diff HK with akinesis of basal inferior wall, grade 1 DD, trivial MR, trivial TR b) RHC (05/06/13): RA 7, RV 30/2, PA 31/19 (24), PCWP 10, Fick CO/CI: 2.9/1.74, Thermo CO/Ci: 2.4/1.4, PA sat 53%  . Ischemic cardiomyopathy   . CAD (coronary artery disease)     a. LHC (04/2013): Chronically occluded LAD, moderate dz in large OM1 and severe dz and small posterior lateral vessel    . A-fib     Past Surgical History  Procedure Laterality Date  . Colostomy    . Ileostomy    . Colostomy takedown      Recent HPI and Hospital Course Patient is a 58 year old female with a history of ischemic cardiomyopathy, chronic systolic heart failure with an EF around 20-25%, prior history of EtOH/tobacco abuse (quit April 2015) recently discharged from the hospital after evaluation for chest pain-LHC was unchanged from April 2015 so she had Cardiac MRI that showed LAD territory was not viable-plan to continue to treat medically.  She currently has no problems, she is at her dry weight currently. Denies any ongoing shortness of breath or leg edema. Denies any chest pain. The only issue today, is that she is requesting as needed Ativan for anxiety. She has been on Ativan before for anxiety Subjective:   [name]  today has, No headache, No chest pain, No abdominal pain - No Nausea, No new weakness tingling or numbness, No Cough - SOB.  Assessment & Plan    Patient Active Problem List   Diagnosis Date Noted  . Anxiety state, unspecified 09/24/2013  . Cardiomyopathy, ischemic- EF 25-30% 2D 09/02/13 09/03/2013  . CAD- total LAD- residual OM2 disease 09/03/2013  . Acute on chronic renal insufficiency 09/03/2013  . Unstable angina pectoris 09/02/2013  . Chest pain 09/02/2013  . Malnutrition of moderate degree 06/07/2013  . Atrial fibrillation with RVR 06/06/2013  . UTI (lower urinary tract infection) 06/06/2013  . Elevated troponin 06/06/2013  . PAF (paroxysmal atrial fibrillation) 06/06/2013  . Chronic systolic CHF (congestive heart failure) 05/20/2013  . Angina, class II 05/10/2013  . Acute systolic CHF (congestive heart failure) 05/09/2013  . Tobacco abuse 05/09/2013  . Family history  of premature CAD 05/09/2013  . Acute respiratory failure 05/09/2013  . SIRS (systemic inflammatory response syndrome) 05/07/2013  . Acute pancreatitis 05/06/2013  . Hypokalemia 05/06/2013  . Leukocytosis, unspecified 05/06/2013  . Alcohol abuse 05/06/2013  . Hypertension   . GERD (gastroesophageal reflux disease)    Chronic systolic heart failure - Compensated - Has followup scheduled for heart failure clinic later this month. - Continue with Lasix, Coreg and lisinopril. She knows that she needs to call I did this clinic, although heart failure clinic if she has rapid weight gain. She has been counseled regarding the importance of a low salt diet.  Coronary artery disease - Recent LHC was unchanged from April 2015 so she had Cardiac MRI that showed LAD territory was not viable-plan to continue to treat medically. Continue with medical manager  Atrial fibrillation - Rate controlled with beta blocker, anticoagulated with Xarelto  Hypothyroidism - Continue with levothyroxine - Recent TSH in May 2015 within  normal limits  Dyslipidemia - Recent LDL and 09/03/13-was 92- repeated in the next 6 months. Continue Lipitor  Hypertension - Controlled with Coreg, lisinopril, Imdur and Lasix  Health Maintenance  - Colonoscopy- claims she had a colonoscopy more than 5 years back-claims she is due in 2020 - Mammography- Refer for screening mammography - Pap smear- refer  - Refused a flu shot, and pneumonia vaccine  Follow [facility] -[facility]    Objective:   Filed Vitals:   09/24/13 1138  BP: 100/63  Pulse: 54  Temp: 97.9 F (36.6 C)  TempSrc: Oral  Resp: 16  Height: 5\' 3"  (1.6 m)  Weight: 127 lb 12.8 oz (57.97 kg)  SpO2: 100%    Wt Readings from Last 3 Encounters:  09/24/13 127 lb 12.8 oz (57.97 kg)  09/21/13 127 lb 4 oz (57.72 kg)  09/07/13 124 lb 14.4 oz (56.654 kg)      Medication List       This list is accurate as of: 09/24/13 11:51 AM.  Always use your most recent med list.               acetaminophen 325 MG tablet  Commonly known as:  TYLENOL  Take 2 tablets (650 mg total) by mouth every 4 (four) hours as needed for headache or mild pain.     aspirin 81 MG chewable tablet  Chew 1 tablet (81 mg total) by mouth daily.     atorvastatin 80 MG tablet  Commonly known as:  LIPITOR  Take 0.5 tablets (40 mg total) by mouth daily at 6 PM.     carvedilol 3.125 MG tablet  Commonly known as:  COREG  Take 3.125-6.25 mg by mouth 2 (two) times daily with a meal. 3.125mg  in the morning and 6.25mg  in the evening     digoxin 0.125 MG tablet  Commonly known as:  LANOXIN  Take 0.5 tablets (0.0625 mg total) by mouth daily.     furosemide 20 MG tablet  Commonly known as:  LASIX  Take 20 mg by mouth as needed.     isosorbide mononitrate 30 MG 24 hr tablet  Commonly known as:  IMDUR  Take 1 tablet (30 mg total) by mouth at bedtime.     levothyroxine 25 MCG tablet  Commonly known as:  SYNTHROID, LEVOTHROID  Take 0.5 tablets (12.5 mcg total) by mouth daily  before breakfast.     lisinopril 5 MG tablet  Commonly known as:  PRINIVIL,ZESTRIL  Take 1 tablet (5  mg total) by mouth at bedtime.     LORazepam 0.5 MG tablet  Commonly known as:  ATIVAN  Take 1 tablet (0.5 mg total) by mouth 2 (two) times daily as needed for anxiety.     nitroGLYCERIN 0.4 MG SL tablet  Commonly known as:  NITROSTAT  Place 1 tablet (0.4 mg total) under the tongue every 5 (five) minutes as needed for chest pain.     rivaroxaban 20 MG Tabs tablet  Commonly known as:  XARELTO  Take 1 tablet (20 mg total) by mouth daily with supper.       Physical Exam Awake Alert, Oriented X 3, No new F.N deficits, Normal affect .AT,PERRAL Supple Neck,No JVD, No cervical lymphadenopathy appriciated.  Symmetrical Chest wall movement, Good air movement bilaterally, CTAB RRR,No Gallops,Rubs or new Murmurs, No Parasternal Heave +ve B.Sounds, Abd Soft, No tenderness, No organomegaly appriciated, No rebound - guarding or rigidity. No Cyanosis, Clubbing or edema, No new Rash or bruise    Data Review   Micro Results No results found for this or any previous visit (from the past 240 hour(s)).   CBC No results found for this basename: WBC, HGB, HCT, PLT, MCV, MCH, MCHC, RDW, NEUTRABS, LYMPHSABS, MONOABS, EOSABS, BASOSABS, BANDABS, BANDSABD,  in the last 168 hours  Chemistries  No results found for this basename: NA, K, CL, CO2, GLUCOSE, BUN, CREATININE, GFRCGP, CALCIUM, MG, AST, ALT, ALKPHOS, BILITOT,  in the last 168 hours ------------------------------------------------------------------------------------------------------------------ estimated creatinine clearance is 44.9 ml/min (by C-G formula based on Cr of 1.13). ------------------------------------------------------------------------------------------------------------------ No results found for this basename: HGBA1C,  in the last 72  hours ------------------------------------------------------------------------------------------------------------------ No results found for this basename: CHOL, HDL, LDLCALC, TRIG, CHOLHDL, LDLDIRECT,  in the last 72 hours ------------------------------------------------------------------------------------------------------------------ No results found for this basename: TSH, T4TOTAL, FREET3, T3FREE, THYROIDAB,  in the last 72 hours ------------------------------------------------------------------------------------------------------------------ No results found for this basename: VITAMINB12, FOLATE, FERRITIN, TIBC, IRON, RETICCTPCT,  in the last 72 hours  Coagulation profile No results found for this basename: INR, PROTIME,  in the last 168 hours  No results found for this basename: DDIMER,  in the last 72 hours  Cardiac Enzymes No results found for this basename: CK, CKMB, TROPONINI, MYOGLOBIN,  in the last 168 hours ------------------------------------------------------------------------------------------------------------------ No components found with this basename: POCBNP,    Time Spent in minutes  57   Thanya Cegielski M.D on 09/24/2013 at 11:51 AM   **Disclaimer: This note may have been dictated with voice recognition software. Similar sounding words can inadvertently be transcribed and this note may contain transcription errors which may not have been corrected upon publication of note.**

## 2013-09-24 NOTE — Progress Notes (Signed)
Patient presents to establish care HFU for [facility] associated with CHF Patient offered and declined flu and pneumococcal vaccines. Provider aware

## 2013-10-01 ENCOUNTER — Telehealth: Payer: Self-pay | Admitting: Licensed Clinical Social Worker

## 2013-10-01 NOTE — Telephone Encounter (Signed)
CSW contacted patient by phone to follow up on her visit to [facility] and wellness appointment from last week. Patient reports all went well and she is now connected with PCP and thrilled. CSW will continue to be available as needed. Raquel Sarna, [place]

## 2013-10-03 ENCOUNTER — Inpatient Hospital Stay (HOSPITAL_COMMUNITY)
Admission: EM | Admit: 2013-10-03 | Discharge: 2013-10-13 | DRG: 917 | Disposition: A | Discharge status: Skilled Nursing Facility | Payer: Medicaid Other | Attending: Internal Medicine | Admitting: Internal Medicine

## 2013-10-03 ENCOUNTER — Encounter (HOSPITAL_COMMUNITY): Payer: Self-pay | Admitting: Emergency Medicine

## 2013-10-03 ENCOUNTER — Emergency Department (HOSPITAL_COMMUNITY): Payer: Medicaid Other

## 2013-10-03 DIAGNOSIS — Z66 Do not resuscitate: Secondary | ICD-10-CM | POA: Diagnosis present

## 2013-10-03 DIAGNOSIS — I447 Left bundle-branch block, unspecified: Secondary | ICD-10-CM | POA: Diagnosis present

## 2013-10-03 DIAGNOSIS — K219 Gastro-esophageal reflux disease without esophagitis: Secondary | ICD-10-CM | POA: Diagnosis present

## 2013-10-03 DIAGNOSIS — F3289 Other specified depressive episodes: Secondary | ICD-10-CM | POA: Diagnosis present

## 2013-10-03 DIAGNOSIS — I2589 Other forms of chronic ischemic heart disease: Secondary | ICD-10-CM | POA: Diagnosis present

## 2013-10-03 DIAGNOSIS — E872 Acidosis, unspecified: Secondary | ICD-10-CM

## 2013-10-03 DIAGNOSIS — N189 Chronic kidney disease, unspecified: Secondary | ICD-10-CM

## 2013-10-03 DIAGNOSIS — N179 Acute kidney failure, unspecified: Secondary | ICD-10-CM | POA: Diagnosis present

## 2013-10-03 DIAGNOSIS — E039 Hypothyroidism, unspecified: Secondary | ICD-10-CM | POA: Diagnosis present

## 2013-10-03 DIAGNOSIS — R4182 Altered mental status, unspecified: Secondary | ICD-10-CM | POA: Diagnosis present

## 2013-10-03 DIAGNOSIS — I498 Other specified cardiac arrhythmias: Secondary | ICD-10-CM | POA: Diagnosis present

## 2013-10-03 DIAGNOSIS — I2582 Chronic total occlusion of coronary artery: Secondary | ICD-10-CM | POA: Diagnosis present

## 2013-10-03 DIAGNOSIS — F1010 Alcohol abuse, uncomplicated: Secondary | ICD-10-CM | POA: Diagnosis present

## 2013-10-03 DIAGNOSIS — I509 Heart failure, unspecified: Secondary | ICD-10-CM | POA: Diagnosis present

## 2013-10-03 DIAGNOSIS — I5023 Acute on chronic systolic (congestive) heart failure: Secondary | ICD-10-CM | POA: Diagnosis not present

## 2013-10-03 DIAGNOSIS — T447X2A Poisoning by beta-adrenoreceptor antagonists, intentional self-harm, initial encounter: Secondary | ICD-10-CM | POA: Diagnosis present

## 2013-10-03 DIAGNOSIS — Z7982 Long term (current) use of aspirin: Secondary | ICD-10-CM

## 2013-10-03 DIAGNOSIS — T1491XA Suicide attempt, initial encounter: Secondary | ICD-10-CM

## 2013-10-03 DIAGNOSIS — T448X1A Poisoning by centrally-acting and adrenergic-neuron-blocking agents, accidental (unintentional), initial encounter: Secondary | ICD-10-CM

## 2013-10-03 DIAGNOSIS — Z87891 Personal history of nicotine dependence: Secondary | ICD-10-CM

## 2013-10-03 DIAGNOSIS — T50902A Poisoning by unspecified drugs, medicaments and biological substances, intentional self-harm, initial encounter: Secondary | ICD-10-CM

## 2013-10-03 DIAGNOSIS — E876 Hypokalemia: Secondary | ICD-10-CM

## 2013-10-03 DIAGNOSIS — R578 Other shock: Secondary | ICD-10-CM | POA: Diagnosis present

## 2013-10-03 DIAGNOSIS — E785 Hyperlipidemia, unspecified: Secondary | ICD-10-CM | POA: Diagnosis present

## 2013-10-03 DIAGNOSIS — J9600 Acute respiratory failure, unspecified whether with hypoxia or hypercapnia: Secondary | ICD-10-CM | POA: Diagnosis present

## 2013-10-03 DIAGNOSIS — T447X1A Poisoning by beta-adrenoreceptor antagonists, accidental (unintentional), initial encounter: Secondary | ICD-10-CM

## 2013-10-03 DIAGNOSIS — F329 Major depressive disorder, single episode, unspecified: Secondary | ICD-10-CM | POA: Diagnosis present

## 2013-10-03 DIAGNOSIS — I480 Paroxysmal atrial fibrillation: Secondary | ICD-10-CM

## 2013-10-03 DIAGNOSIS — I2510 Atherosclerotic heart disease of native coronary artery without angina pectoris: Secondary | ICD-10-CM | POA: Diagnosis present

## 2013-10-03 DIAGNOSIS — Z79899 Other long term (current) drug therapy: Secondary | ICD-10-CM | POA: Diagnosis not present

## 2013-10-03 DIAGNOSIS — T447X2S Poisoning by beta-adrenoreceptor antagonists, intentional self-harm, sequela: Secondary | ICD-10-CM

## 2013-10-03 DIAGNOSIS — I5021 Acute systolic (congestive) heart failure: Secondary | ICD-10-CM

## 2013-10-03 DIAGNOSIS — E8729 Other acidosis: Secondary | ICD-10-CM

## 2013-10-03 DIAGNOSIS — I4891 Unspecified atrial fibrillation: Secondary | ICD-10-CM | POA: Diagnosis present

## 2013-10-03 DIAGNOSIS — K861 Other chronic pancreatitis: Secondary | ICD-10-CM | POA: Diagnosis present

## 2013-10-03 DIAGNOSIS — Z7901 Long term (current) use of anticoagulants: Secondary | ICD-10-CM

## 2013-10-03 DIAGNOSIS — I10 Essential (primary) hypertension: Secondary | ICD-10-CM | POA: Diagnosis present

## 2013-10-03 DIAGNOSIS — I9589 Other hypotension: Secondary | ICD-10-CM

## 2013-10-03 DIAGNOSIS — I255 Ischemic cardiomyopathy: Secondary | ICD-10-CM

## 2013-10-03 DIAGNOSIS — I952 Hypotension due to drugs: Secondary | ICD-10-CM

## 2013-10-03 DIAGNOSIS — I69959 Hemiplegia and hemiparesis following unspecified cerebrovascular disease affecting unspecified side: Secondary | ICD-10-CM

## 2013-10-03 DIAGNOSIS — T50992A Poisoning by other drugs, medicaments and biological substances, intentional self-harm, initial encounter: Secondary | ICD-10-CM | POA: Diagnosis present

## 2013-10-03 DIAGNOSIS — J9601 Acute respiratory failure with hypoxia: Secondary | ICD-10-CM

## 2013-10-03 DIAGNOSIS — T46904A Poisoning by unspecified agents primarily affecting the cardiovascular system, undetermined, initial encounter: Secondary | ICD-10-CM | POA: Diagnosis present

## 2013-10-03 DIAGNOSIS — I959 Hypotension, unspecified: Secondary | ICD-10-CM

## 2013-10-03 DIAGNOSIS — N289 Disorder of kidney and ureter, unspecified: Secondary | ICD-10-CM

## 2013-10-03 DIAGNOSIS — I5022 Chronic systolic (congestive) heart failure: Secondary | ICD-10-CM

## 2013-10-03 HISTORY — DX: Poisoning by unspecified drugs, medicaments and biological substances, intentional self-harm, initial encounter: T50.902A

## 2013-10-03 LAB — I-STAT CHEM 8, ED
BUN: 10 mg/dL (ref 6–23)
BUN: 10 mg/dL (ref 6–23)
CHLORIDE: 107 meq/L (ref 96–112)
CREATININE: 1.5 mg/dL — AB (ref 0.50–1.10)
Calcium, Ion: 1.13 mmol/L (ref 1.12–1.23)
Calcium, Ion: 1.13 mmol/L (ref 1.12–1.23)
Chloride: 107 mEq/L (ref 96–112)
Creatinine, Ser: 1.6 mg/dL — ABNORMAL HIGH (ref 0.50–1.10)
GLUCOSE: 174 mg/dL — AB (ref 70–99)
Glucose, Bld: 174 mg/dL — ABNORMAL HIGH (ref 70–99)
HCT: 24 % — ABNORMAL LOW (ref 36.0–46.0)
HEMATOCRIT: 26 % — AB (ref 36.0–46.0)
Hemoglobin: 8.2 g/dL — ABNORMAL LOW (ref 12.0–15.0)
Hemoglobin: 8.8 g/dL — ABNORMAL LOW (ref 12.0–15.0)
Potassium: 3.2 mEq/L — ABNORMAL LOW (ref 3.7–5.3)
Potassium: 3.2 mEq/L — ABNORMAL LOW (ref 3.7–5.3)
Sodium: 142 mEq/L (ref 137–147)
Sodium: 142 mEq/L (ref 137–147)
TCO2: 18 mmol/L (ref 0–100)
TCO2: 18 mmol/L (ref 0–100)

## 2013-10-03 LAB — CBC WITH DIFFERENTIAL/PLATELET
BASOS ABS: 0 10*3/uL (ref 0.0–0.1)
Basophils Relative: 1 % (ref 0–1)
EOS ABS: 0.2 10*3/uL (ref 0.0–0.7)
Eosinophils Relative: 5 % (ref 0–5)
HCT: 26 % — ABNORMAL LOW (ref 36.0–46.0)
Hemoglobin: 8.5 g/dL — ABNORMAL LOW (ref 12.0–15.0)
Lymphocytes Relative: 33 % (ref 12–46)
Lymphs Abs: 1.3 10*3/uL (ref 0.7–4.0)
MCH: 30.1 pg (ref 26.0–34.0)
MCHC: 32.7 g/dL (ref 30.0–36.0)
MCV: 92.2 fL (ref 78.0–100.0)
Monocytes Absolute: 0.3 10*3/uL (ref 0.1–1.0)
Monocytes Relative: 7 % (ref 3–12)
Neutro Abs: 2.1 10*3/uL (ref 1.7–7.7)
Neutrophils Relative %: 54 % (ref 43–77)
PLATELETS: 219 10*3/uL (ref 150–400)
RBC: 2.82 MIL/uL — ABNORMAL LOW (ref 3.87–5.11)
RDW: 13.7 % (ref 11.5–15.5)
WBC: 3.9 10*3/uL — ABNORMAL LOW (ref 4.0–10.5)

## 2013-10-03 LAB — COMPREHENSIVE METABOLIC PANEL
ALT: 14 U/L (ref 0–35)
AST: 20 U/L (ref 0–37)
Albumin: 2.8 g/dL — ABNORMAL LOW (ref 3.5–5.2)
Alkaline Phosphatase: 95 U/L (ref 39–117)
Anion gap: 19 — ABNORMAL HIGH (ref 5–15)
BUN: 12 mg/dL (ref 6–23)
CALCIUM: 8.1 mg/dL — AB (ref 8.4–10.5)
CO2: 17 mEq/L — ABNORMAL LOW (ref 19–32)
Chloride: 104 mEq/L (ref 96–112)
Creatinine, Ser: 1.31 mg/dL — ABNORMAL HIGH (ref 0.50–1.10)
GFR calc non Af Amer: 44 mL/min — ABNORMAL LOW (ref >90)
GFR, EST AFRICAN AMERICAN: 51 mL/min — AB (ref >90)
Glucose, Bld: 175 mg/dL — ABNORMAL HIGH (ref 70–99)
Potassium: 3.4 mEq/L — ABNORMAL LOW (ref 3.7–5.3)
SODIUM: 140 meq/L (ref 137–147)
TOTAL PROTEIN: 5.8 g/dL — AB (ref 6.0–8.3)
Total Bilirubin: <0.2 mg/dL — ABNORMAL LOW (ref 0.3–1.2)

## 2013-10-03 LAB — RAPID URINE DRUG SCREEN, HOSP PERFORMED
Amphetamines: NOT DETECTED
BENZODIAZEPINES: NOT DETECTED
Barbiturates: NOT DETECTED
Cocaine: NOT DETECTED
Opiates: NOT DETECTED
TETRAHYDROCANNABINOL: NOT DETECTED

## 2013-10-03 LAB — URINALYSIS, ROUTINE W REFLEX MICROSCOPIC
BILIRUBIN URINE: NEGATIVE
GLUCOSE, UA: 100 mg/dL — AB
Ketones, ur: NEGATIVE mg/dL
Nitrite: NEGATIVE
PH: 6.5 (ref 5.0–8.0)
Protein, ur: NEGATIVE mg/dL
SPECIFIC GRAVITY, URINE: 1.01 (ref 1.005–1.030)
Urobilinogen, UA: 0.2 mg/dL (ref 0.0–1.0)

## 2013-10-03 LAB — I-STAT ARTERIAL BLOOD GAS, ED
Acid-base deficit: 6 mmol/L — ABNORMAL HIGH (ref 0.0–2.0)
Bicarbonate: 19.6 mEq/L — ABNORMAL LOW (ref 20.0–24.0)
O2 Saturation: 100 %
PO2 ART: 386 mmHg — AB (ref 80.0–100.0)
Patient temperature: 98.6
TCO2: 21 mmol/L (ref 0–100)
pCO2 arterial: 40 mmHg (ref 35.0–45.0)
pH, Arterial: 7.297 — ABNORMAL LOW (ref 7.350–7.450)

## 2013-10-03 LAB — URINE MICROSCOPIC-ADD ON

## 2013-10-03 LAB — PROTIME-INR
INR: 5.87 — AB (ref 0.00–1.49)
Prothrombin Time: 52.6 seconds — ABNORMAL HIGH (ref 11.6–15.2)

## 2013-10-03 LAB — ACETAMINOPHEN LEVEL: Acetaminophen (Tylenol), Serum: <15 ug/mL (ref 10–30)

## 2013-10-03 LAB — I-STAT CG4 LACTIC ACID, ED: Lactic Acid, Venous: 4.71 mmol/L — ABNORMAL HIGH (ref 0.5–2.2)

## 2013-10-03 LAB — I-STAT TROPONIN, ED: Troponin i, poc: 0 ng/mL (ref 0.00–0.08)

## 2013-10-03 LAB — ETHANOL: Alcohol, Ethyl (B): 107 mg/dL — ABNORMAL HIGH (ref 0–11)

## 2013-10-03 LAB — SALICYLATE LEVEL: Salicylate Lvl: <2 mg/dL — ABNORMAL LOW (ref 2.8–20.0)

## 2013-10-03 LAB — DIGOXIN LEVEL: DIGOXIN LVL: 0.7 ng/mL — AB (ref 0.8–2.0)

## 2013-10-03 MED ORDER — GLUCAGON HCL RDNA (DIAGNOSTIC) 1 MG IJ SOLR
5.0000 mg | Freq: Once | INTRAVENOUS | Status: AC
  Administered 2013-10-03: 5 mg via INTRAVENOUS
  Filled 2013-10-03: qty 5

## 2013-10-03 MED ORDER — DOPAMINE-DEXTROSE 3.2-5 MG/ML-% IV SOLN
INTRAVENOUS | Status: AC
  Administered 2013-10-03: 800 mg via INTRAVENOUS
  Filled 2013-10-03: qty 250

## 2013-10-03 MED ORDER — DOPAMINE-DEXTROSE 3.2-5 MG/ML-% IV SOLN
2.0000 ug/kg/min | INTRAVENOUS | Status: DC
  Administered 2013-10-04: 20 ug/kg/min via INTRAVENOUS
  Administered 2013-10-05: 6 ug/kg/min via INTRAVENOUS
  Filled 2013-10-03 (×3): qty 250

## 2013-10-03 MED ORDER — CALCIUM CHLORIDE 10 % IV SOLN
1.0000 g | Freq: Once | INTRAVENOUS | Status: AC
  Administered 2013-10-04: 1 g via INTRAVENOUS
  Filled 2013-10-03: qty 10

## 2013-10-03 MED ORDER — DOCUSATE SODIUM 100 MG PO CAPS
100.0000 mg | ORAL_CAPSULE | Freq: Two times a day (BID) | ORAL | Status: DC | PRN
  Filled 2013-10-03: qty 1

## 2013-10-03 MED ORDER — SODIUM CHLORIDE 0.9 % IV SOLN
1000.0000 mL | INTRAVENOUS | Status: DC
  Administered 2013-10-03 – 2013-10-04 (×2): 1000 mL via INTRAVENOUS

## 2013-10-03 MED ORDER — SUCCINYLCHOLINE CHLORIDE 20 MG/ML IJ SOLN
INTRAMUSCULAR | Status: AC
  Filled 2013-10-03: qty 1

## 2013-10-03 MED ORDER — FAMOTIDINE IN NACL 20-0.9 MG/50ML-% IV SOLN
20.0000 mg | Freq: Two times a day (BID) | INTRAVENOUS | Status: DC
  Administered 2013-10-04 – 2013-10-05 (×4): 20 mg via INTRAVENOUS
  Filled 2013-10-03 (×5): qty 50

## 2013-10-03 MED ORDER — MIDAZOLAM HCL 2 MG/2ML IJ SOLN: 2.0000 mg | Freq: Once | INTRAMUSCULAR | Status: DC

## 2013-10-03 MED ORDER — SODIUM CHLORIDE 0.9 % IV SOLN
1000.0000 mL | Freq: Once | INTRAVENOUS | Status: AC
  Administered 2013-10-03: 1000 mL via INTRAVENOUS

## 2013-10-03 MED ORDER — GLUCAGON HCL RDNA (DIAGNOSTIC) 1 MG IJ SOLR
1.0000 mg | Freq: Once | INTRAMUSCULAR | Status: AC
  Administered 2013-10-03: 1 mg via INTRAVENOUS

## 2013-10-03 MED ORDER — MIDAZOLAM HCL 5 MG/5ML IJ SOLN: 2.0000 mg | Freq: Once | INTRAMUSCULAR | Status: DC

## 2013-10-03 MED ORDER — HEPARIN SODIUM (PORCINE) 5000 UNIT/ML IJ SOLN: 5000.0000 [IU] | Freq: Three times a day (TID) | INTRAMUSCULAR | Status: DC

## 2013-10-03 MED ORDER — DOPAMINE-DEXTROSE 3.2-5 MG/ML-% IV SOLN: 2.0000 ug/kg/min | INTRAVENOUS | Status: DC

## 2013-10-03 MED ORDER — LIDOCAINE HCL (CARDIAC) 20 MG/ML IV SOLN
INTRAVENOUS | Status: AC
  Filled 2013-10-03: qty 5

## 2013-10-03 MED ORDER — GLUCAGON HCL RDNA (DIAGNOSTIC) 1 MG IJ SOLR
INTRAMUSCULAR | Status: AC
  Administered 2013-10-03: 1 mg via INTRAVENOUS
  Filled 2013-10-03: qty 1

## 2013-10-03 MED ORDER — NOREPINEPHRINE BITARTRATE 1 MG/ML IV SOLN
2.0000 ug/min | Freq: Once | INTRAVENOUS | Status: AC
  Administered 2013-10-03: 2 ug/min via INTRAVENOUS
  Filled 2013-10-03: qty 4

## 2013-10-03 MED ORDER — ROCURONIUM BROMIDE 50 MG/5ML IV SOLN
INTRAVENOUS | Status: AC
  Administered 2013-10-03: 60 mg
  Filled 2013-10-03: qty 2

## 2013-10-03 MED ORDER — FENTANYL CITRATE 0.05 MG/ML IJ SOLN
100.0000 ug | INTRAMUSCULAR | Status: DC | PRN
  Filled 2013-10-03: qty 2

## 2013-10-03 MED ORDER — ETOMIDATE 2 MG/ML IV SOLN
INTRAVENOUS | Status: AC
  Administered 2013-10-03: 20 mg
  Filled 2013-10-03: qty 20

## 2013-10-03 MED ORDER — SODIUM CHLORIDE 0.9 % IV SOLN: 250.0000 mL | INTRAVENOUS | Status: DC | PRN

## 2013-10-03 MED ORDER — GLUCAGON HCL RDNA (DIAGNOSTIC) 1 MG IJ SOLR
5.0000 mg | Freq: Once | INTRAMUSCULAR | Status: DC
  Filled 2013-10-03: qty 5

## 2013-10-03 MED ORDER — FENTANYL CITRATE 0.05 MG/ML IJ SOLN
50.0000 ug | INTRAMUSCULAR | Status: DC | PRN
  Administered 2013-10-03 – 2013-10-04 (×3): 50 ug via INTRAVENOUS
  Filled 2013-10-03: qty 2

## 2013-10-03 MED ORDER — ALBUTEROL SULFATE (2.5 MG/3ML) 0.083% IN NEBU: 2.5000 mg | INHALATION_SOLUTION | RESPIRATORY_TRACT | Status: DC | PRN

## 2013-10-03 MED ORDER — FENTANYL CITRATE 0.05 MG/ML IJ SOLN: 100.0000 ug | INTRAMUSCULAR | Status: DC | PRN

## 2013-10-03 MED ORDER — GLUCAGON HCL RDNA (DIAGNOSTIC) 1 MG IJ SOLR
5.0000 mg/h | INTRAVENOUS | Status: DC
  Administered 2013-10-04 (×2): 5 mg/h via INTRAVENOUS
  Filled 2013-10-03 (×2): qty 25

## 2013-10-03 MED ORDER — DOPAMINE-DEXTROSE 3.2-5 MG/ML-% IV SOLN
2.0000 ug/kg/min | Freq: Once | INTRAVENOUS | Status: DC
  Administered 2013-10-03: 800 mg via INTRAVENOUS

## 2013-10-03 NOTE — ED Notes (Signed)
Spoke with Dr. Mauri Brooklyn from [facility].  Verbal order given to add levophed drip to medications due to BP continuing to be in the 70's.  HR in the 60's currently.

## 2013-10-03 NOTE — ED Notes (Signed)
Critical Care at bedside.  

## 2013-10-03 NOTE — H&P (Addendum)
PULMONARY / CRITICAL CARE MEDICINE HISTORY AND PHYSICAL EXAMINATION   Name: [name] MRN: [id number] DOB: 06/27/1955    ADMISSION DATE:  10/03/2013  PRIMARY SERVICE: PCCM  CHIEF COMPLAINT:  Bradycardia, hypotension, AMS  BRIEF PATIENT DESCRIPTION: 64yof with PMH of ETOH abuse, a fib, HTN, ICM with EF of 25-30% CVA, CAD who presents to ED via EMS for eval of AMS and bradycardia.       SIGNIFICANT EVENTS / STUDIES:  10/03/13: INR: 5.87, ETOH: 107   LINES / TUBES: 10/03/13: R IO in place  CULTURES: None  ANTIBIOTICS: None  HISTORY OF PRESENT ILLNESS: 47yof with PMH of ETOH abuse, a fib, HTN, CVA, CAD who presents to ED via EMS for eval of AMS and bradycardia.  Hx obtained from chart review and discussion with ED physician as pt unable to provide hx. Pt was found altered at home by EMS with multiple empty pill bottles stating that she took them in an attempt to end her life.  Pt was bradycardic into the 30's in the field requiring external pacing.  She became increasingly altered here requiring intubation.  There is a high clinical suspicion for beta-blocker OD so glucagon 5mg  was given IV.  She was also started on levophed and DA for hypotension and bradycardia.  Poison control has been contacted.     PAST MEDICAL HISTORY :  Past Medical History  Diagnosis Date  . Diverticulosis   . Pancreatitis   . Hypertension   . Stroke 2011  . Hyperlipidemia   . Anxiety   . GERD (gastroesophageal reflux disease)   . ETOH abuse   . chronic systolic heart failure     a. ECHO (05/08/13): EF 20-25%, diff HK with akinesis of basal inferior wall, grade 1 DD, trivial MR, trivial TR b) RHC (05/06/13): RA 7, RV 30/2, PA 31/19 (24), PCWP 10, Fick CO/CI: 2.9/1.74, Thermo CO/Ci: 2.4/1.4, PA sat 53%  . Ischemic cardiomyopathy   . CAD (coronary artery disease)     a. LHC (04/2013): Chronically occluded LAD, moderate dz in large OM1 and severe dz and small posterior lateral vessel    . A-fib    Past  Surgical History  Procedure Laterality Date  . Colostomy    . Ileostomy    . Colostomy takedown     Prior to Admission medications   Medication Sig Start Date End Date Taking? Authorizing Provider  acetaminophen (TYLENOL) 325 MG tablet Take 2 tablets (650 mg total) by mouth every 4 (four) hours as needed for headache or mild pain. 09/07/13   Erlene Quan, PA-C  aspirin 81 MG chewable tablet Chew 1 tablet (81 mg total) by mouth daily. 05/14/13   Geradine Girt, DO  atorvastatin (LIPITOR) 80 MG tablet Take 0.5 tablets (40 mg total) by mouth daily at 6 PM. 08/17/13   Rande Brunt, NP  carvedilol (COREG) 3.125 MG tablet Take 3.125-6.25 mg by mouth 2 (two) times daily with a meal. 3.125mg  in the morning and 6.25mg  in the evening    Historical Provider, MD  digoxin (LANOXIN) 0.125 MG tablet Take 0.5 tablets (0.0625 mg total) by mouth daily. 08/17/13   Rande Brunt, NP  furosemide (LASIX) 20 MG tablet Take 20 mg by mouth as needed.    Historical Provider, MD  isosorbide mononitrate (IMDUR) 30 MG 24 hr tablet Take 1 tablet (30 mg total) by mouth at bedtime. 09/21/13   Amy D Clegg, NP  levothyroxine (SYNTHROID, LEVOTHROID) 25 MCG tablet Take 0.5 tablets (  12.5 mcg total) by mouth daily before breakfast. 08/17/13   Rande Brunt, NP  lisinopril (PRINIVIL,ZESTRIL) 5 MG tablet Take 1 tablet (5 mg total) by mouth at bedtime. 08/17/13   Rande Brunt, NP  LORazepam (ATIVAN) 0.5 MG tablet Take 1 tablet (0.5 mg total) by mouth 2 (two) times daily as needed for anxiety. 09/24/13   Shanker Kristeen Mans, MD  nitroGLYCERIN (NITROSTAT) 0.4 MG SL tablet Place 1 tablet (0.4 mg total) under the tongue every 5 (five) minutes as needed for chest pain. 09/07/13   Erlene Quan, PA-C  rivaroxaban (XARELTO) 20 MG TABS tablet Take 1 tablet (20 mg total) by mouth daily with supper. 08/17/13   Rande Brunt, NP   Allergies  Allergen Reactions  . Penicillins Anaphylaxis and Rash  . Morphine And Related Other (See Comments)     Dizziness and hallucinations    FAMILY HISTORY:  Family History  Problem Relation Age of Onset  . CAD Father 64    MI  . CAD Mother 80    MI  . CAD Sister 63    Stent  . CAD Brother 97    MI   SOCIAL HISTORY: Unable to obtain 2/2 AMS  REVIEW OF SYSTEMS:  Unable to obtain 2/2 AMS  SUBJECTIVE:   VITAL SIGNS: Temp:  [91.4 F (33 C)-91.6 F (33.1 C)] 91.6 F (33.1 C) (09/13 2231) Pulse Rate:  [32-66] 66 (09/13 2300) Resp:  [11-31] 16 (09/13 2300) BP: (63-136)/(35-68) 75/39 mmHg (09/13 2300) SpO2:  [81 %-100 %] 99 % (09/13 2300) Weight:  [127 lb 13.9 oz (58 kg)] 127 lb 13.9 oz (58 kg) (09/13 2210) HEMODYNAMICS:   VENTILATOR SETTINGS: Vent Mode:  [-] PRVC Set Rate:  [16 bmp] 16 bmp Vt Set:  [450 mL] 450 mL INTAKE / OUTPUT: Intake/Output     09/13 0701 - 09/14 0700   I.V. (mL/kg) 2000 (34.5)   Total Intake(mL/kg) 2000 (34.5)   Net +2000         PHYSICAL EXAMINATION: General:  Intubated, sedated Neuro:  Sedated, Pupils 93mm and sluggishly reactive, downgoing toes bilaterally HEENT:  No JVD, ETT in place Cardiovascular:  Loletha Grayer but regular, no edema Lungs:  Clear with good air movement Abdomen:  +BS Skin:  No rash or lesions MSK:  Normal bulk/ tone  LABS:  CBC  Recent Labs Lab 10/03/13 2035 10/03/13 2045 10/03/13 2058  WBC  --  3.9*  --   HGB 8.2* 8.5* 8.8*  HCT 24.0* 26.0* 26.0*  PLT  --  219  --    Coag's  Recent Labs Lab 10/03/13 2045  INR 5.87*   BMET  Recent Labs Lab 10/03/13 2035 10/03/13 2045 10/03/13 2058  NA 142 140 142  K 3.2* 3.4* 3.2*  CL 107 104 107  CO2  --  17*  --   BUN 10 12 10   CREATININE 1.50* 1.31* 1.60*  GLUCOSE 174* 175* 174*   Electrolytes  Recent Labs Lab 10/03/13 2045  CALCIUM 8.1*   Sepsis Markers  Recent Labs Lab 10/03/13 2035  LATICACIDVEN 4.71*   ABG  Recent Labs Lab 10/03/13 2117  PHART 7.297*  PCO2ART 40.0  PO2ART 386.0*   Liver Enzymes  Recent Labs Lab 10/03/13 2045  AST 20   ALT 14  ALKPHOS 95  BILITOT <0.2*  ALBUMIN 2.8*   Cardiac Enzymes No results found for this basename: TROPONINI, PROBNP,  in the last 168 hours Glucose No results found for this basename: GLUCAP,  in the last 168 hours  Imaging Dg Chest Port 1 View  10/03/2013   CLINICAL DATA:  Drug overdose.  Suicidal ideation.  EXAM: PORTABLE CHEST - 1 VIEW  COMPARISON:  Chest radiograph performed 09/02/2013  FINDINGS: The patient's endotracheal tube is seen ending 5 cm above the carina. An enteric tube is noted extending below the diaphragm.  The lungs are well-aerated and clear, though the left lung is difficult to fully assess due to overlying pacing pads. There is no evidence of focal opacification, pleural effusion or pneumothorax.  The cardiomediastinal silhouette is borderline normal in size. No acute osseous abnormalities are seen. External pacing pads are noted.  IMPRESSION: 1. Endotracheal tube seen ending 5 cm above the carina. 2. No acute cardiopulmonary process seen.   Electronically Signed   By: Garald Balding M.D.   On: 10/03/2013 21:37    EKG: Sinus brady with prolonged qt CXR: No acute airspace disease, overall normal with exception of ETT in place  ASSESSMENT / PLAN:  Principal Problem:   Overdose of beta-adrenergic antagonist drug Active Problems:   Hypotension   Suicide attempt by beta blocker overdose   PULMONARY A: Intubated for airway protection  P:   Cont MV, wean as pt awakens VAP bundle  CARDIOVASCULAR A: Bradycardia, likely 2/2 carvedilol OD.  Pt also on dig but dig level was 0.7 so doubt pt took this as well.  May have also taken imdur and NTG.  Also with ETOH of 107.    ICM with EF of 25-30%  P:   S/p 3L IVF in ED Received 5mg  glucagon and 1G calcium Glucagon gtt at 5mg /hr Poison control aware DA and NE as needed to keep MAPS >65 Plan for central line placement Hold home meds for now  RENAL A: No acute issues P:   Monitor  UOP  GASTROINTESTINAL A: Need for nutrition P:   Nutrition c/s for TF's  HEMATOLOGIC A: Elevated INR, suspect 2/2 Xarelto OD  P:   Q6hr coags for now Low threshold for head CT if clinical picture changes  INFECTIOUS A: No acute issus P:   Monitor for signs of developing infection  ENDOCRINE A: Pt on synthroid at home.  Doubt she OD'ed on this as well given clinical picture   P:   Will check TSH, free t4  Holding home synthroid until results back  NEUROLOGIC A: AMS 2/2 drug OD P:   PRN fentanyl for sedation Expect pt to awaken over next 24-48 hrs as drugs clear from system  BEST PRACTICE / DISPOSITION Level of Care:  ICU Primary Service:  PCCm Consultants:  Poison Control Code Status:  Full Diet:  DNR DVT Px:  Contraindicated 2/2 INR GI Px:  H2 Skin Integrity:  q2 turns Social / Family:  Santiago Glad (sister) [phone].  Updated via phone  TODAY'S SUMMARY: 16yof with PMH of ETOH abuse, a fib, HTN, CVA, CAD who presents to ED via EMS for eval of AMS and bradycardia.    Currently getting glucagon gtt, NE and DA to combat bradycardia.  I have personally obtained a history, examined the patient, evaluated laboratory and imaging results, formulated the assessment and plan and placed orders.  CRITICAL CARE: The patient is critically ill with multiple organ systems failure and requires high complexity decision making for assessment and support, frequent evaluation and titration of therapies, application of advanced monitoring technologies and extensive interpretation of multiple databases. Critical Care Time devoted to patient care services described in this note is 33  minutes.   Lucrezia Starch, MD Pulmonary [facility] [place] Pager: [phone]   10/03/2013, 11:27 PM

## 2013-10-03 NOTE — Progress Notes (Addendum)
Chaplain sat with husband of pt, while MD talked with him.  Husband very anxious, concerned that his wife took all of her medications after he trusted her to take them correctly.  States that he will 'take control" of her medications if she "makes it through." Husband said that he was going home after MD consult because he couldn't do anything for his wife.... Asked that RN from 2900 call him to give update after pt. Was settled in new room.  Husband:  Lorrin Mais, [phone]  Rev. [place], [place]

## 2013-10-03 NOTE — ED Notes (Signed)
Patient arrived via EMS from home. EMS reported that they found patient minimally responsive, upon talking with patient she reported that she took "all of them" referring to her medications. EMS was not certain what was taken but noted a beta-blockers within medications referred to. EMS found patient initially hypotensive with SBP @ 71 and HR of 40. EMS began external pacing prior to arrival and blood pressure and hr stabilized. Upon arrival patent was combative and not redirectable. GCS difficult to assess at patient would not respond directly to questions. EMS also reported that patient takes Xarelto, and they found an empty SL NTG tablet bottle in her belongings as well.

## 2013-10-03 NOTE — Progress Notes (Signed)
Chaplain(s) responded to change of room and potential intubation update.    Will follow.  Rev.Jan Hill,Chaplain [phone]

## 2013-10-03 NOTE — ED Notes (Signed)
Critical value read back to Dr. Mauri Brooklyn, MD from critical care.

## 2013-10-03 NOTE — ED Notes (Signed)
BP 74/39; manual pressure taken 72/38.  EDP made aware.  Will continue to monitor pressure.  HR 46.  Fluids opened wide open with pressure bag.

## 2013-10-03 NOTE — ED Provider Notes (Signed)
CSN: [id number]     Arrival date & time 10/03/13  2027 History   First MD Initiated Contact with Patient 10/03/13 2044     Chief Complaint  Patient presents with  . Drug Overdose  . Suicidal    The history is provided by the EMS personnel.   Level 5 caveat due to acuity of condition and AMS. Hx obtained from EMS.   Pt w/ PMHS doe ETOH, a fib, HTN, CVA, CAD who presents to ED via EMS for eval of AMS.  Found somnolent and convfused on toilet with pill bottles open. EMS said beta blocker bottle was at bedside and empty.  There were other pill bottles in next room.  [facility] wnl en route.  Given 1mg  glucagon without response. HR 30-40.  Transcutaneously paced at 70. BP 140/80ish. O2 sats mid 90s. On NRB. Pt cannot answer questions with exception of answering she intentionally took overdose of multiple meds to end her life.   Past Medical History  Diagnosis Date  . Diverticulosis   . Pancreatitis   . Hypertension   . Stroke 2011  . Hyperlipidemia   . Anxiety   . GERD (gastroesophageal reflux disease)   . ETOH abuse   . chronic systolic heart failure     a. ECHO (05/08/13): EF 20-25%, diff HK with akinesis of basal inferior wall, grade 1 DD, trivial MR, trivial TR b) RHC (05/06/13): RA 7, RV 30/2, PA 31/19 (24), PCWP 10, Fick CO/CI: 2.9/1.74, Thermo CO/Ci: 2.4/1.4, PA sat 53%  . Ischemic cardiomyopathy   . CAD (coronary artery disease)     a. LHC (04/2013): Chronically occluded LAD, moderate dz in large OM1 and severe dz and small posterior lateral vessel    . A-fib    Past Surgical History  Procedure Laterality Date  . Colostomy    . Ileostomy    . Colostomy takedown     Family History  Problem Relation Age of Onset  . CAD Father 74    MI  . CAD Mother 102    MI  . CAD Sister 4    Stent  . CAD Brother 71    MI   History  Substance Use Topics  . Smoking status: Former Smoker -- 1.00 packs/day for 40 years    Types: Cigarettes  . Smokeless tobacco: Never Used     Comment: Quit  April   . Alcohol Use: Yes     Comment: 2 3 TIMES A WEEK   OB History   Grav Para Term Preterm Abortions TAB SAB Ect Mult Living                 Review of Systems  Unable to perform ROS: Mental status change     Allergies  Penicillins and Morphine and related  Home Medications   Prior to Admission medications   Medication Sig Start Date End Date Taking? Authorizing Provider  acetaminophen (TYLENOL) 325 MG tablet Take 2 tablets (650 mg total) by mouth every 4 (four) hours as needed for headache or mild pain. 09/07/13   Erlene Quan, PA-C  aspirin 81 MG chewable tablet Chew 1 tablet (81 mg total) by mouth daily. 05/14/13   Geradine Girt, DO  atorvastatin (LIPITOR) 80 MG tablet Take 0.5 tablets (40 mg total) by mouth daily at 6 PM. 08/17/13   Rande Brunt, NP  carvedilol (COREG) 3.125 MG tablet Take 3.125-6.25 mg by mouth 2 (two) times daily with a meal. 3.125mg  in the morning  and 6.25mg  in the evening    Historical Provider, MD  digoxin (LANOXIN) 0.125 MG tablet Take 0.5 tablets (0.0625 mg total) by mouth daily. 08/17/13   Rande Brunt, NP  furosemide (LASIX) 20 MG tablet Take 20 mg by mouth as needed.    Historical Provider, MD  isosorbide mononitrate (IMDUR) 30 MG 24 hr tablet Take 1 tablet (30 mg total) by mouth at bedtime. 09/21/13   Amy D Ninfa Meeker, NP  levothyroxine (SYNTHROID, LEVOTHROID) 25 MCG tablet Take 0.5 tablets (12.5 mcg total) by mouth daily before breakfast. 08/17/13   Rande Brunt, NP  lisinopril (PRINIVIL,ZESTRIL) 5 MG tablet Take 1 tablet (5 mg total) by mouth at bedtime. 08/17/13   Rande Brunt, NP  LORazepam (ATIVAN) 0.5 MG tablet Take 1 tablet (0.5 mg total) by mouth 2 (two) times daily as needed for anxiety. 09/24/13   Shanker Kristeen Mans, MD  nitroGLYCERIN (NITROSTAT) 0.4 MG SL tablet Place 1 tablet (0.4 mg total) under the tongue every 5 (five) minutes as needed for chest pain. 09/07/13   Erlene Quan, PA-C  rivaroxaban (XARELTO) 20 MG TABS tablet Take 1 tablet (20  mg total) by mouth daily with supper. 08/17/13   Rande Brunt, NP   BP 63/44  Pulse 66  Temp(Src) 93.9 F (34.4 C) (Other (Comment))  Resp 16  Ht 5\' 3"  (1.6 m)  Wt 130 lb 11.7 oz (59.3 kg)  BMI 23.16 kg/m2  SpO2 99% Physical Exam  Nursing note and vitals reviewed. Constitutional: She appears well-developed and well-nourished. She appears lethargic. She appears distressed.  HENT:  Head: Normocephalic and atraumatic.  Nose: Nose normal.  Mouth/Throat: No oropharyngeal exudate.  opa  Eyes: Conjunctivae are normal.  Pupils 29mm equal, sluggish  Neck: Normal range of motion. Neck supple. No JVD present. No tracheal deviation present.  Cardiovascular: Regular rhythm and normal heart sounds.   No murmur heard. Brady 40bpm   Pulmonary/Chest: She is in respiratory distress.  Tachypneic, irregular respirations, bilateral breath sounds, clear  Abdominal: Soft. Bowel sounds are normal. She exhibits no distension and no mass. There is no tenderness.  Musculoskeletal: Normal range of motion. She exhibits no edema and no tenderness.  No lower extremity edema, calf tenderness, warmth, erythema or palpable cords  Neurological: She appears lethargic. She is disoriented. GCS eye subscore is 1. GCS verbal subscore is 3. GCS motor subscore is 4.  Skin: Skin is warm and dry. No rash noted.    ED Course  INTUBATION Date/Time: 10/03/2013 8:45 PM Performed by: Tammy Sours Authorized by: Tammy Sours Consent: The procedure was performed in an emergent situation. Required items: required blood products, implants, devices, and special equipment available Indications: airway protection Intubation method: video-assisted Patient status: paralyzed (RSI) Preoxygenation: nonrebreather mask Sedatives: etomidate Paralytic: rocuronium Laryngoscope size: glidescope 3. Tube size: 7.5 mm Tube type: cuffed Number of attempts: 1 Cricoid pressure: no Cords visualized: no Post-procedure assessment: chest  rise and ETCO2 monitor Breath sounds: equal and absent over the epigastrium ETT to teeth: 22 cm Tube secured with: ETT holder Chest x-ray interpreted by me, other physician and radiologist. Chest x-ray findings: endotracheal tube in appropriate position Patient tolerance: Patient tolerated the procedure well with no immediate complications. Comments: Hypoxia immediately with paralysis for 30 seconds, improved after tube placed.    (including critical care time) Labs Review Labs Reviewed  DIGOXIN LEVEL - Abnormal; Notable for the following:    Digoxin Level 0.7 (*)    All other components within normal  limits  CBC WITH DIFFERENTIAL - Abnormal; Notable for the following:    WBC 3.9 (*)    RBC 2.82 (*)    Hemoglobin 8.5 (*)    HCT 26.0 (*)    All other components within normal limits  COMPREHENSIVE METABOLIC PANEL - Abnormal; Notable for the following:    Potassium 3.4 (*)    CO2 17 (*)    Glucose, Bld 175 (*)    Creatinine, Ser 1.31 (*)    Calcium 8.1 (*)    Total Protein 5.8 (*)    Albumin 2.8 (*)    Total Bilirubin <0.2 (*)    GFR calc non Af Amer 44 (*)    GFR calc Af Amer 51 (*)    Anion gap 19 (*)    All other components within normal limits  ETHANOL - Abnormal; Notable for the following:    Alcohol, Ethyl (B) 107 (*)    All other components within normal limits  SALICYLATE LEVEL - Abnormal; Notable for the following:    Salicylate Lvl <3.2 (*)    All other components within normal limits  URINALYSIS, ROUTINE W REFLEX MICROSCOPIC - Abnormal; Notable for the following:    Color, Urine STRAW (*)    APPearance CLOUDY (*)    Glucose, UA 100 (*)    Hgb urine dipstick TRACE (*)    Leukocytes, UA TRACE (*)    All other components within normal limits  PROTIME-INR - Abnormal; Notable for the following:    Prothrombin Time 52.6 (*)    INR 5.87 (*)    All other components within normal limits  URINE MICROSCOPIC-ADD ON - Abnormal; Notable for the following:    Casts  HYALINE CASTS (*)    All other components within normal limits  I-STAT CHEM 8, ED - Abnormal; Notable for the following:    Potassium 3.2 (*)    Creatinine, Ser 1.50 (*)    Glucose, Bld 174 (*)    Hemoglobin 8.2 (*)    HCT 24.0 (*)    All other components within normal limits  I-STAT CG4 LACTIC ACID, ED - Abnormal; Notable for the following:    Lactic Acid, Venous 4.71 (*)    All other components within normal limits  I-STAT ARTERIAL BLOOD GAS, ED - Abnormal; Notable for the following:    pH, Arterial 7.297 (*)    pO2, Arterial 386.0 (*)    Bicarbonate 19.6 (*)    Acid-base deficit 6.0 (*)    All other components within normal limits  I-STAT CHEM 8, ED - Abnormal; Notable for the following:    Potassium 3.2 (*)    Creatinine, Ser 1.60 (*)    Glucose, Bld 174 (*)    Hemoglobin 8.8 (*)    HCT 26.0 (*)    All other components within normal limits  MRSA PCR SCREENING  ACETAMINOPHEN LEVEL  URINE RAPID DRUG SCREEN (HOSP PERFORMED)  URINALYSIS, ROUTINE W REFLEX MICROSCOPIC  CBC  BASIC METABOLIC PANEL  BLOOD GAS, ARTERIAL  MAGNESIUM  PHOSPHORUS  TSH  T4, FREE  I-STAT TROPOININ, ED    Imaging Review Dg Chest Port 1 View  10/03/2013   CLINICAL DATA:  Drug overdose.  Suicidal ideation.  EXAM: PORTABLE CHEST - 1 VIEW  COMPARISON:  Chest radiograph performed 09/02/2013  FINDINGS: The patient's endotracheal tube is seen ending 5 cm above the carina. An enteric tube is noted extending below the diaphragm.  The lungs are well-aerated and clear, though the left lung is difficult  to fully assess due to overlying pacing pads. There is no evidence of focal opacification, pleural effusion or pneumothorax.  The cardiomediastinal silhouette is borderline normal in size. No acute osseous abnormalities are seen. External pacing pads are noted.  IMPRESSION: 1. Endotracheal tube seen ending 5 cm above the carina. 2. No acute cardiopulmonary process seen.   Electronically Signed   By: Garald Balding  M.D.   On: 10/03/2013 21:37     MDM   Final diagnoses:  Overdose of beta-adrenergic antagonist drug, intentional self-harm, initial encounter  Suicide attempt by beta blocker overdose, initial encounter  Hypotension due to drugs  Acute respiratory failure with hypoxia  Lactic acidosis  High anion gap metabolic acidosis  Hypokalemia  AKI (acute kidney injury)  ETOH abuse    Pt presents mostly obtunded in sinus bradycardia after ingestion of meds. Pt states she took beta blockers and other meds at home with intent to harm self. Ordered glucagon. PIVs established. Emergently intubated.  BP initially stable. Then became hypotensive later in ED course. IVF resuscitated with transient response. Dig level normal. No hyperK. EKG with sinus bradycardia, no other ischemic changes.  Psych panel ordered, tylenol level pending. INR elevated, which can be seen in high dose Xarelto overdose, deferred reversal agents as pt not bleeding on exam. ETOH+. Critical care consulted early in ED course.    9:40 PM Approached by nursing, patient has only received 1mg  IV glucagon in error, as opposed to 5mg  ordered.  Provide full dose.  HR 40-50. MAPS 50s  Critical care started dopamine infusion.  HR improved but BP unchanged and hypotensive. Start levo low dose through PIV. Critical care to place central line.   Tammy Sours, MD 10/04/13 [phone]

## 2013-10-03 NOTE — Progress Notes (Signed)
Patient was transported to 2H09 from ER-Trauma A with no complications.

## 2013-10-04 ENCOUNTER — Inpatient Hospital Stay (HOSPITAL_COMMUNITY): Payer: Medicaid Other

## 2013-10-04 DIAGNOSIS — T448X1A Poisoning by centrally-acting and adrenergic-neuron-blocking agents, accidental (unintentional), initial encounter: Secondary | ICD-10-CM

## 2013-10-04 DIAGNOSIS — F1010 Alcohol abuse, uncomplicated: Secondary | ICD-10-CM

## 2013-10-04 DIAGNOSIS — X838XXA Intentional self-harm by other specified means, initial encounter: Secondary | ICD-10-CM

## 2013-10-04 DIAGNOSIS — I5022 Chronic systolic (congestive) heart failure: Secondary | ICD-10-CM

## 2013-10-04 DIAGNOSIS — J9600 Acute respiratory failure, unspecified whether with hypoxia or hypercapnia: Secondary | ICD-10-CM

## 2013-10-04 DIAGNOSIS — T50992A Poisoning by other drugs, medicaments and biological substances, intentional self-harm, initial encounter: Secondary | ICD-10-CM

## 2013-10-04 LAB — CBC
HCT: 29.5 % — ABNORMAL LOW (ref 36.0–46.0)
Hemoglobin: 9.6 g/dL — ABNORMAL LOW (ref 12.0–15.0)
MCH: 29.6 pg (ref 26.0–34.0)
MCHC: 32.5 g/dL (ref 30.0–36.0)
MCV: 91 fL (ref 78.0–100.0)
Platelets: 295 10*3/uL (ref 150–400)
RBC: 3.24 MIL/uL — ABNORMAL LOW (ref 3.87–5.11)
RDW: 13.6 % (ref 11.5–15.5)
WBC: 10.4 10*3/uL (ref 4.0–10.5)

## 2013-10-04 LAB — AMMONIA: Ammonia: 21 umol/L (ref 11–60)

## 2013-10-04 LAB — MAGNESIUM: Magnesium: 1.5 mg/dL (ref 1.5–2.5)

## 2013-10-04 LAB — BLOOD GAS, ARTERIAL
ACID-BASE DEFICIT: 5.8 mmol/L — AB (ref 0.0–2.0)
Bicarbonate: 19.1 mEq/L — ABNORMAL LOW (ref 20.0–24.0)
DRAWN BY: 41881
FIO2: 0.5 %
LHR: 16 {breaths}/min
O2 Saturation: 99.4 %
PEEP/CPAP: 5 cmH2O
PH ART: 7.334 — AB (ref 7.350–7.450)
PO2 ART: 154 mmHg — AB (ref 80.0–100.0)
Patient temperature: 97.1
TCO2: 20.3 mmol/L (ref 0–100)
VT: 450 mL
pCO2 arterial: 36.5 mmHg (ref 35.0–45.0)

## 2013-10-04 LAB — GLUCOSE, CAPILLARY
GLUCOSE-CAPILLARY: 141 mg/dL — AB (ref 70–99)
GLUCOSE-CAPILLARY: 98 mg/dL (ref 70–99)
Glucose-Capillary: 62 mg/dL — ABNORMAL LOW (ref 70–99)

## 2013-10-04 LAB — CBC WITH DIFFERENTIAL/PLATELET
BASOS ABS: 0 10*3/uL (ref 0.0–0.1)
BASOS PCT: 0 % (ref 0–1)
EOS ABS: 0 10*3/uL (ref 0.0–0.7)
Eosinophils Relative: 0 % (ref 0–5)
HEMATOCRIT: 29.3 % — AB (ref 36.0–46.0)
HEMOGLOBIN: 9.7 g/dL — AB (ref 12.0–15.0)
Lymphocytes Relative: 5 % — ABNORMAL LOW (ref 12–46)
Lymphs Abs: 0.7 10*3/uL (ref 0.7–4.0)
MCH: 29.4 pg (ref 26.0–34.0)
MCHC: 33.1 g/dL (ref 30.0–36.0)
MCV: 88.8 fL (ref 78.0–100.0)
MONO ABS: 0.9 10*3/uL (ref 0.1–1.0)
Monocytes Relative: 6 % (ref 3–12)
NEUTROS ABS: 12.4 10*3/uL — AB (ref 1.7–7.7)
NEUTROS PCT: 89 % — AB (ref 43–77)
Platelets: 258 10*3/uL (ref 150–400)
RBC: 3.3 MIL/uL — ABNORMAL LOW (ref 3.87–5.11)
RDW: 13.4 % (ref 11.5–15.5)
WBC: 14.1 10*3/uL — ABNORMAL HIGH (ref 4.0–10.5)

## 2013-10-04 LAB — BASIC METABOLIC PANEL
Anion gap: 18 — ABNORMAL HIGH (ref 5–15)
BUN: 12 mg/dL (ref 6–23)
CALCIUM: 7.6 mg/dL — AB (ref 8.4–10.5)
CO2: 18 meq/L — AB (ref 19–32)
Chloride: 108 mEq/L (ref 96–112)
Creatinine, Ser: 1.01 mg/dL (ref 0.50–1.10)
GFR calc Af Amer: 70 mL/min — ABNORMAL LOW (ref >90)
GFR, EST NON AFRICAN AMERICAN: 60 mL/min — AB (ref >90)
Glucose, Bld: 145 mg/dL — ABNORMAL HIGH (ref 70–99)
Potassium: 3.7 mEq/L (ref 3.7–5.3)
SODIUM: 144 meq/L (ref 137–147)

## 2013-10-04 LAB — COMPREHENSIVE METABOLIC PANEL
ALK PHOS: 110 U/L (ref 39–117)
ALT: 17 U/L (ref 0–35)
AST: 24 U/L (ref 0–37)
Albumin: 3.2 g/dL — ABNORMAL LOW (ref 3.5–5.2)
Anion gap: 16 — ABNORMAL HIGH (ref 5–15)
BUN: 16 mg/dL (ref 6–23)
CO2: 17 meq/L — AB (ref 19–32)
Calcium: 7.7 mg/dL — ABNORMAL LOW (ref 8.4–10.5)
Chloride: 104 mEq/L (ref 96–112)
Creatinine, Ser: 0.97 mg/dL (ref 0.50–1.10)
GFR calc non Af Amer: 63 mL/min — ABNORMAL LOW (ref >90)
GFR, EST AFRICAN AMERICAN: 73 mL/min — AB (ref >90)
Glucose, Bld: 103 mg/dL — ABNORMAL HIGH (ref 70–99)
POTASSIUM: 4.1 meq/L (ref 3.7–5.3)
SODIUM: 137 meq/L (ref 137–147)
Total Bilirubin: 0.4 mg/dL (ref 0.3–1.2)
Total Protein: 6.5 g/dL (ref 6.0–8.3)

## 2013-10-04 LAB — PROTIME-INR
INR: 4.28 — ABNORMAL HIGH (ref 0.00–1.49)
INR: 2.98 — AB (ref 0.00–1.49)
INR: 2.35 — AB (ref 0.00–1.49)
INR: 2.48 — AB (ref 0.00–1.49)
PROTHROMBIN TIME: 41.1 s — AB (ref 11.6–15.2)
Prothrombin Time: 31 seconds — ABNORMAL HIGH (ref 11.6–15.2)
Prothrombin Time: 25.7 seconds — ABNORMAL HIGH (ref 11.6–15.2)
Prothrombin Time: 26.8 seconds — ABNORMAL HIGH (ref 11.6–15.2)

## 2013-10-04 LAB — MRSA PCR SCREENING: MRSA by PCR: NEGATIVE

## 2013-10-04 LAB — T4, FREE: Free T4: 1.29 ng/dL (ref 0.80–1.80)

## 2013-10-04 LAB — PHOSPHORUS: Phosphorus: 2.4 mg/dL (ref 2.3–4.6)

## 2013-10-04 LAB — TSH: TSH: 10.63 u[IU]/mL — ABNORMAL HIGH (ref 0.350–4.500)

## 2013-10-04 MED ORDER — ADULT MULTIVITAMIN LIQUID CH
5.0000 mL | Freq: Every day | ORAL | Status: DC
  Administered 2013-10-04: 5 mL
  Filled 2013-10-04 (×2): qty 5

## 2013-10-04 MED ORDER — SODIUM CHLORIDE 0.9 % IV SOLN
0.0000 ug/h | INTRAVENOUS | Status: DC
  Administered 2013-10-04: 50 ug/h via INTRAVENOUS
  Administered 2013-10-05: 125 ug/h via INTRAVENOUS
  Filled 2013-10-04 (×2): qty 50

## 2013-10-04 MED ORDER — FENTANYL CITRATE 0.05 MG/ML IJ SOLN: 50.0000 ug | Freq: Once | INTRAMUSCULAR | Status: DC

## 2013-10-04 MED ORDER — PRO-STAT SUGAR FREE PO LIQD
30.0000 mL | Freq: Every day | ORAL | Status: DC
  Administered 2013-10-04: 30 mL
  Filled 2013-10-04 (×2): qty 30

## 2013-10-04 MED ORDER — PANTOPRAZOLE SODIUM 40 MG IV SOLR
40.0000 mg | Freq: Every day | INTRAVENOUS | Status: DC
  Administered 2013-10-04 – 2013-10-06 (×3): 40 mg via INTRAVENOUS
  Filled 2013-10-04 (×4): qty 40

## 2013-10-04 MED ORDER — FENTANYL BOLUS VIA INFUSION
50.0000 ug | INTRAVENOUS | Status: DC | PRN
  Administered 2013-10-04: 100 ug via INTRAVENOUS
  Filled 2013-10-04: qty 100

## 2013-10-04 MED ORDER — LEVOTHYROXINE SODIUM 25 MCG PO TABS
12.5000 ug | ORAL_TABLET | Freq: Every day | ORAL | Status: DC
  Administered 2013-10-05 – 2013-10-13 (×9): 12.5 ug via ORAL
  Filled 2013-10-04 (×11): qty 0.5

## 2013-10-04 MED ORDER — POTASSIUM CHLORIDE 10 MEQ/50ML IV SOLN
10.0000 meq | INTRAVENOUS | Status: AC
  Administered 2013-10-04 (×6): 10 meq via INTRAVENOUS
  Filled 2013-10-04 (×6): qty 50

## 2013-10-04 MED ORDER — DEXTROSE 5 % IV SOLN: 2.0000 ug/min | INTRAVENOUS | Status: DC

## 2013-10-04 MED ORDER — CETYLPYRIDINIUM CHLORIDE 0.05 % MT LIQD
7.0000 mL | Freq: Four times a day (QID) | OROMUCOSAL | Status: DC
  Administered 2013-10-04 – 2013-10-05 (×6): 7 mL via OROMUCOSAL

## 2013-10-04 MED ORDER — DEXTROSE 50 % IV SOLN
INTRAVENOUS | Status: AC
  Filled 2013-10-04: qty 50

## 2013-10-04 MED ORDER — CHLORHEXIDINE GLUCONATE 0.12 % MT SOLN
15.0000 mL | Freq: Two times a day (BID) | OROMUCOSAL | Status: DC
  Administered 2013-10-04 – 2013-10-05 (×3): 15 mL via OROMUCOSAL
  Filled 2013-10-04 (×3): qty 15

## 2013-10-04 MED ORDER — VITAL HIGH PROTEIN PO LIQD: 1000.0000 mL | ORAL | Status: DC

## 2013-10-04 MED ORDER — MAGNESIUM SULFATE 40 MG/ML IJ SOLN
2.0000 g | Freq: Once | INTRAMUSCULAR | Status: AC
  Administered 2013-10-04: 2 g via INTRAVENOUS
  Filled 2013-10-04: qty 50

## 2013-10-04 MED ORDER — DEXTROSE 50 % IV SOLN
25.0000 mL | Freq: Once | INTRAVENOUS | Status: AC | PRN
  Administered 2013-10-04: 25 mL via INTRAVENOUS

## 2013-10-04 MED ORDER — VITAL AF 1.2 CAL PO LIQD
1000.0000 mL | ORAL | Status: DC
  Administered 2013-10-04: 1000 mL
  Filled 2013-10-04 (×3): qty 1000

## 2013-10-04 NOTE — Consult Note (Addendum)
  Reason for Consult: Beta blocker overdose Referring Physician:   [name] is an 58 y.o. female.  HPI:    58 y.o. female with a PMH for severe ICM (EF 20-25%), extensive CAD with chronic total occlusion of the LAD artery and moderate stenosis in a large second oblique marginal artery and severe disease in a small posterolateral ventricular artery. She was last admitted with congestive heart failure in April 2015. She had paroxysmal fibrillation with rapid RVR in May 2015. She has a very broad LBBB, a history of HTN, hyperlipidemia, and previous stroke with some residual left hemiparesis. She also has a history of chronic alcohol abuse and acute pancreatitis. She has been abstinent from alcohol and tobacco since the spring She is currently followed in the heart failure clinic for titration of heart failure medications and consideration of future defibrillator implantation. Her home "dry weight" seems to be around 125 pounds.   She was admitted 09/02/13 with Lt chest pain, SOB, nausea and vomiting x 1. NTG seemed to help. She was admitted from the ER for further evaluation. EF by echo was 25-30%. Her Troponin was elevated and it was decided to proceed with cath. This was done 09/03/13 and revealedchronic total occlusion of the proximal LAD which is collateralized from both the circumflex and the right coronary. There was moderate obstructive obtuse marginal #1 and obtuse marginal #2 disease. There was severe disease in the continuation of the right coronary beyond the PDA prior to the origin of 2 small to moderate-sized left ventricular branches. The anatomy was unchanged from the April 2015 angiogram. It was decided to proceed with a Cardiac MRI for viability. This was done 09/07/13 and review by Dr Skains and Dr Nelson. This revealed that the LAD territory was not viable.  History obtained from chart review.  The patient is intubated alert and responsive.  She apparently overdosed on beta blocker,  Xarelto and possibly lorazepam in an attempt to commit suicide.  HR according to EMS was 30-40's.  She was transcutaneously paced.  Dopamine was started and is at 10mcg currerntly.  Glucagon was started but is off currently.   She denies and CP, ABD pain, LEE.  Past Medical History  Diagnosis Date  . Diverticulosis   . Pancreatitis   . Hypertension   . Stroke 2011  . Hyperlipidemia   . Anxiety   . GERD (gastroesophageal reflux disease)   . ETOH abuse   . chronic systolic heart failure     a. ECHO (05/08/13): EF 20-25%, diff HK with akinesis of basal inferior wall, grade 1 DD, trivial MR, trivial TR b) RHC (05/06/13): RA 7, RV 30/2, PA 31/19 (24), PCWP 10, Fick CO/CI: 2.9/1.74, Thermo CO/Ci: 2.4/1.4, PA sat 53%  . Ischemic cardiomyopathy   . CAD (coronary artery disease)     a. LHC (04/2013): Chronically occluded LAD, moderate dz in large OM1 and severe dz and small posterior lateral vessel    . A-fib     Past Surgical History  Procedure Laterality Date  . Colostomy    . Ileostomy    . Colostomy takedown      Family History  Problem Relation Age of Onset  . CAD Father 34    MI  . CAD Mother 58    MI  . CAD Sister 56    Stent  . CAD Brother 33    MI    Social History:  reports that she has quit smoking. Her smoking use included Cigarettes.   She has a 40 pack-year smoking history. She has never used smokeless tobacco. She reports that she drinks alcohol. She reports that she does not use illicit drugs.  Allergies:  Allergies  Allergen Reactions  . Penicillins Anaphylaxis and Rash  . Morphine And Related Other (See Comments)    Dizziness and hallucinations    Medications:  Scheduled Meds: . antiseptic oral rinse  7 mL Mouth Rinse QID  . chlorhexidine  15 mL Mouth Rinse BID  . famotidine (PEPCID) IV  20 mg Intravenous Q12H  . fentaNYL  50 mcg Intravenous Once  . [START ON 10/05/2013] levothyroxine  12.5 mcg Oral QAC breakfast  . midazolam  2 mg Intravenous Once  .  pantoprazole (PROTONIX) IV  40 mg Intravenous QHS   Continuous Infusions: . sodium chloride 1,000 mL (10/04/13 1230)  . DOPamine 10 mcg/kg/min (10/04/13 1230)  . fentaNYL infusion INTRAVENOUS 50 mcg/hr (10/04/13 1300)  . glucagon (GLUCAGEN) infusion (for beta blocker/calcium channel blocker overdose) Stopped (10/04/13 1225)  . norepinephrine (LEVOPHED) Adult infusion Stopped (10/04/13 0316)   PRN Meds:.sodium chloride, albuterol, docusate sodium, fentaNYL, fentaNYL, fentaNYL, fentaNYL   Results for orders placed during the hospital encounter of 10/03/13 (from the past 48 hour(s))  I-STAT TROPOININ, ED     Status: None   Collection Time    10/03/13  8:33 PM      Result Value Ref Range   Troponin i, poc 0.00  0.00 - 0.08 ng/mL   Comment 3            Comment: Due to the release kinetics of cTnI,     a negative result within the first hours     of the onset of symptoms does not rule out     myocardial infarction with certainty.     If myocardial infarction is still suspected,     repeat the test at appropriate intervals.  I-STAT CHEM 8, ED     Status: Abnormal   Collection Time    10/03/13  8:35 PM      Result Value Ref Range   Sodium 142  137 - 147 mEq/L   Potassium 3.2 (*) 3.7 - 5.3 mEq/L   Chloride 107  96 - 112 mEq/L   BUN 10  6 - 23 mg/dL   Creatinine, Ser 1.50 (*) 0.50 - 1.10 mg/dL   Glucose, Bld 174 (*) 70 - 99 mg/dL   Calcium, Ion 1.13  1.12 - 1.23 mmol/L   TCO2 18  0 - 100 mmol/L   Hemoglobin 8.2 (*) 12.0 - 15.0 g/dL   HCT 24.0 (*) 36.0 - 46.0 %  I-STAT CG4 LACTIC ACID, ED     Status: Abnormal   Collection Time    10/03/13  8:35 PM      Result Value Ref Range   Lactic Acid, Venous 4.71 (*) 0.5 - 2.2 mmol/L  DIGOXIN LEVEL     Status: Abnormal   Collection Time    10/03/13  8:45 PM      Result Value Ref Range   Digoxin Level 0.7 (*) 0.8 - 2.0 ng/mL  ACETAMINOPHEN LEVEL     Status: None   Collection Time    10/03/13  8:45 PM      Result Value Ref Range    Acetaminophen (Tylenol), Serum <15.0  10 - 30 ug/mL   Comment:            THERAPEUTIC CONCENTRATIONS VARY     SIGNIFICANTLY. A RANGE OF 10-30       ug/mL MAY BE AN EFFECTIVE     CONCENTRATION FOR MANY PATIENTS.     HOWEVER, SOME ARE BEST TREATED     AT CONCENTRATIONS OUTSIDE THIS     RANGE.     ACETAMINOPHEN CONCENTRATIONS     >150 ug/mL AT 4 HOURS AFTER     INGESTION AND >50 ug/mL AT 12     HOURS AFTER INGESTION ARE     OFTEN ASSOCIATED WITH TOXIC     REACTIONS.  CBC WITH DIFFERENTIAL     Status: Abnormal   Collection Time    10/03/13  8:45 PM      Result Value Ref Range   WBC 3.9 (*) 4.0 - 10.5 K/uL   RBC 2.82 (*) 3.87 - 5.11 MIL/uL   Hemoglobin 8.5 (*) 12.0 - 15.0 g/dL   HCT 26.0 (*) 36.0 - 46.0 %   MCV 92.2  78.0 - 100.0 fL   MCH 30.1  26.0 - 34.0 pg   MCHC 32.7  30.0 - 36.0 g/dL   RDW 13.7  11.5 - 15.5 %   Platelets 219  150 - 400 K/uL   Neutrophils Relative % 54  43 - 77 %   Neutro Abs 2.1  1.7 - 7.7 K/uL   Lymphocytes Relative 33  12 - 46 %   Lymphs Abs 1.3  0.7 - 4.0 K/uL   Monocytes Relative 7  3 - 12 %   Monocytes Absolute 0.3  0.1 - 1.0 K/uL   Eosinophils Relative 5  0 - 5 %   Eosinophils Absolute 0.2  0.0 - 0.7 K/uL   Basophils Relative 1  0 - 1 %   Basophils Absolute 0.0  0.0 - 0.1 K/uL  COMPREHENSIVE METABOLIC PANEL     Status: Abnormal   Collection Time    10/03/13  8:45 PM      Result Value Ref Range   Sodium 140  137 - 147 mEq/L   Potassium 3.4 (*) 3.7 - 5.3 mEq/L   Chloride 104  96 - 112 mEq/L   CO2 17 (*) 19 - 32 mEq/L   Glucose, Bld 175 (*) 70 - 99 mg/dL   BUN 12  6 - 23 mg/dL   Creatinine, Ser 1.31 (*) 0.50 - 1.10 mg/dL   Calcium 8.1 (*) 8.4 - 10.5 mg/dL   Total Protein 5.8 (*) 6.0 - 8.3 g/dL   Albumin 2.8 (*) 3.5 - 5.2 g/dL   AST 20  0 - 37 U/L   ALT 14  0 - 35 U/L   Alkaline Phosphatase 95  39 - 117 U/L   Total Bilirubin <0.2 (*) 0.3 - 1.2 mg/dL   GFR calc non Af Amer 44 (*) >90 mL/min   GFR calc Af Amer 51 (*) >90 mL/min   Comment:  (NOTE)     The eGFR has been calculated using the CKD EPI equation.     This calculation has not been validated in all clinical situations.     eGFR's persistently <90 mL/min signify possible Chronic Kidney     Disease.   Anion gap 19 (*) 5 - 15  ETHANOL     Status: Abnormal   Collection Time    10/03/13  8:45 PM      Result Value Ref Range   Alcohol, Ethyl (B) 107 (*) 0 - 11 mg/dL   Comment:            LOWEST DETECTABLE LIMIT FOR     SERUM ALCOHOL IS 11  mg/dL     FOR MEDICAL PURPOSES ONLY  SALICYLATE LEVEL     Status: Abnormal   Collection Time    10/03/13  8:45 PM      Result Value Ref Range   Salicylate Lvl <2.0 (*) 2.8 - 20.0 mg/dL  PROTIME-INR     Status: Abnormal   Collection Time    10/03/13  8:45 PM      Result Value Ref Range   Prothrombin Time 52.6 (*) 11.6 - 15.2 seconds   INR 5.87 (*) 0.00 - 1.49   Comment: REPEATED TO VERIFY     CRITICAL RESULT CALLED TO, READ BACK BY AND VERIFIED WITH:     J NEGRON,RN [id number]  I-STAT CHEM 8, ED     Status: Abnormal   Collection Time    10/03/13  8:58 PM      Result Value Ref Range   Sodium 142  137 - 147 mEq/L   Potassium 3.2 (*) 3.7 - 5.3 mEq/L   Chloride 107  96 - 112 mEq/L   BUN 10  6 - 23 mg/dL   Creatinine, Ser 1.60 (*) 0.50 - 1.10 mg/dL   Glucose, Bld 174 (*) 70 - 99 mg/dL   Calcium, Ion 1.13  1.12 - 1.23 mmol/L   TCO2 18  0 - 100 mmol/L   Hemoglobin 8.8 (*) 12.0 - 15.0 g/dL   HCT 26.0 (*) 36.0 - 46.0 %  URINE RAPID DRUG SCREEN (HOSP PERFORMED)     Status: None   Collection Time    10/03/13  9:13 PM      Result Value Ref Range   Opiates NONE DETECTED  NONE DETECTED   Cocaine NONE DETECTED  NONE DETECTED   Benzodiazepines NONE DETECTED  NONE DETECTED   Amphetamines NONE DETECTED  NONE DETECTED   Tetrahydrocannabinol NONE DETECTED  NONE DETECTED   Barbiturates NONE DETECTED  NONE DETECTED   Comment:            DRUG SCREEN FOR MEDICAL PURPOSES     ONLY.  IF CONFIRMATION IS NEEDED     FOR ANY  PURPOSE, NOTIFY LAB     WITHIN 5 DAYS.                LOWEST DETECTABLE LIMITS     FOR URINE DRUG SCREEN     Drug Class       Cutoff (ng/mL)     Amphetamine      1000     Barbiturate      200     Benzodiazepine   200     Tricyclics       300     Opiates          300     Cocaine          300     THC              50  URINALYSIS, ROUTINE W REFLEX MICROSCOPIC     Status: Abnormal   Collection Time    10/03/13  9:13 PM      Result Value Ref Range   Color, Urine STRAW (*) YELLOW   APPearance CLOUDY (*) CLEAR   Specific Gravity, Urine 1.010  1.005 - 1.030   pH 6.5  5.0 - 8.0   Glucose, UA 100 (*) NEGATIVE mg/dL   Hgb urine dipstick TRACE (*) NEGATIVE   Bilirubin Urine NEGATIVE  NEGATIVE   Ketones, ur NEGATIVE  NEGATIVE mg/dL     Protein, ur NEGATIVE  NEGATIVE mg/dL   Urobilinogen, UA 0.2  0.0 - 1.0 mg/dL   Nitrite NEGATIVE  NEGATIVE   Leukocytes, UA TRACE (*) NEGATIVE  URINE MICROSCOPIC-ADD ON     Status: Abnormal   Collection Time    10/03/13  9:13 PM      Result Value Ref Range   WBC, UA 0-2  <3 WBC/hpf   RBC / HPF 0-2  <3 RBC/hpf   Bacteria, UA RARE  RARE   Casts HYALINE CASTS (*) NEGATIVE  I-STAT ARTERIAL BLOOD GAS, ED     Status: Abnormal   Collection Time    10/03/13  9:17 PM      Result Value Ref Range   pH, Arterial 7.297 (*) 7.350 - 7.450   pCO2 arterial 40.0  35.0 - 45.0 mmHg   pO2, Arterial 386.0 (*) 80.0 - 100.0 mmHg   Bicarbonate 19.6 (*) 20.0 - 24.0 mEq/L   TCO2 21  0 - 100 mmol/L   O2 Saturation 100.0     Acid-base deficit 6.0 (*) 0.0 - 2.0 mmol/L   Patient temperature 98.6 F     Collection site RADIAL, ALLEN'S TEST ACCEPTABLE     Drawn by RT     Sample type ARTERIAL    MRSA PCR SCREENING     Status: None   Collection Time    10/03/13 11:32 PM      Result Value Ref Range   MRSA by PCR NEGATIVE  NEGATIVE   Comment:            The GeneXpert MRSA Assay (FDA     approved for NASAL specimens     only), is one component of a     comprehensive MRSA  colonization     surveillance program. It is not     intended to diagnose MRSA     infection nor to guide or     monitor treatment for     MRSA infections.  CBC     Status: Abnormal   Collection Time    10/04/13 12:20 AM      Result Value Ref Range   WBC 10.4  4.0 - 10.5 K/uL   RBC 3.24 (*) 3.87 - 5.11 MIL/uL   Hemoglobin 9.6 (*) 12.0 - 15.0 g/dL   HCT 29.5 (*) 36.0 - 46.0 %   MCV 91.0  78.0 - 100.0 fL   MCH 29.6  26.0 - 34.0 pg   MCHC 32.5  30.0 - 36.0 g/dL   RDW 13.6  11.5 - 15.5 %   Platelets 295  150 - 400 K/uL   Comment: REPEATED TO VERIFY  BASIC METABOLIC PANEL     Status: Abnormal   Collection Time    10/04/13 12:20 AM      Result Value Ref Range   Sodium 144  137 - 147 mEq/L   Potassium 3.7  3.7 - 5.3 mEq/L   Chloride 108  96 - 112 mEq/L   CO2 18 (*) 19 - 32 mEq/L   Glucose, Bld 145 (*) 70 - 99 mg/dL   BUN 12  6 - 23 mg/dL   Creatinine, Ser 1.01  0.50 - 1.10 mg/dL   Comment: DELTA CHECK NOTED   Calcium 7.6 (*) 8.4 - 10.5 mg/dL   GFR calc non Af Amer 60 (*) >90 mL/min   GFR calc Af Amer 70 (*) >90 mL/min   Comment: (NOTE)     The eGFR has been calculated using the CKD EPI   equation.     This calculation has not been validated in all clinical situations.     eGFR's persistently <90 mL/min signify possible Chronic Kidney     Disease.   Anion gap 18 (*) 5 - 15  MAGNESIUM     Status: None   Collection Time    10/04/13 12:20 AM      Result Value Ref Range   Magnesium 1.5  1.5 - 2.5 mg/dL  PHOSPHORUS     Status: None   Collection Time    10/04/13 12:20 AM      Result Value Ref Range   Phosphorus 2.4  2.3 - 4.6 mg/dL  TSH     Status: Abnormal   Collection Time    10/04/13 12:20 AM      Result Value Ref Range   TSH 10.630 (*) 0.350 - 4.500 uIU/mL  T4, FREE     Status: None   Collection Time    10/04/13 12:20 AM      Result Value Ref Range   Free T4 1.29  0.80 - 1.80 ng/dL   Comment: Performed at [facility]  PROTIME-INR     Status: Abnormal    Collection Time    10/04/13  1:48 AM      Result Value Ref Range   Prothrombin Time 41.1 (*) 11.6 - 15.2 seconds   INR 4.28 (*) 0.00 - 1.49  BLOOD GAS, ARTERIAL     Status: Abnormal   Collection Time    10/04/13  3:30 AM      Result Value Ref Range   FIO2 0.50     Delivery systems VENTILATOR     Mode PRESSURE REGULATED VOLUME CONTROL     VT 450     Rate 16     Peep/cpap 5.0     pH, Arterial 7.334 (*) 7.350 - 7.450   pCO2 arterial 36.5  35.0 - 45.0 mmHg   pO2, Arterial 154.0 (*) 80.0 - 100.0 mmHg   Bicarbonate 19.1 (*) 20.0 - 24.0 mEq/L   TCO2 20.3  0 - 100 mmol/L   Acid-base deficit 5.8 (*) 0.0 - 2.0 mmol/L   O2 Saturation 99.4     Patient temperature 97.1     Collection site ARTERIAL LINE     Drawn by [id number]     Sample type ARTERIAL DRAW     Allens test (pass/fail) PASS  PASS  PROTIME-INR     Status: Abnormal   Collection Time    10/04/13  9:00 AM      Result Value Ref Range   Prothrombin Time 31.0 (*) 11.6 - 15.2 seconds   INR 2.98 (*) 0.00 - 1.49    Dg Chest Port 1 View  10/03/2013   CLINICAL DATA:  Drug overdose.  Suicidal ideation.  EXAM: PORTABLE CHEST - 1 VIEW  COMPARISON:  Chest radiograph performed 09/02/2013  FINDINGS: The patient's endotracheal tube is seen ending 5 cm above the carina. An enteric tube is noted extending below the diaphragm.  The lungs are well-aerated and clear, though the left lung is difficult to fully assess due to overlying pacing pads. There is no evidence of focal opacification, pleural effusion or pneumothorax.  The cardiomediastinal silhouette is borderline normal in size. No acute osseous abnormalities are seen. External pacing pads are noted.  IMPRESSION: 1. Endotracheal tube seen ending 5 cm above the carina. 2. No acute cardiopulmonary process seen.   Electronically Signed   By: Jeffery  Chang M.D.     On: 10/03/2013 21:37    ROS Limited ROS:  Denies CP, Abd pain, LEE  Blood pressure 137/48, pulse 57, temperature 97.7 F (36.5 C),  temperature source Other (Comment), resp. rate 14, height 5' 3" (1.6 m), weight 130 lb 11.7 oz (59.3 kg), SpO2 100.00%. Physical Exam  Nursing note and vitals reviewed. Constitutional: She appears well-developed and well-nourished. No distress.  HENT:  Head: Normocephalic and atraumatic.  Eyes: EOM are normal. Pupils are equal, round, and reactive to light. No scleral icterus.  Neck: Normal range of motion. Neck supple. No JVD present.  Cardiovascular: Normal rate, regular rhythm, S1 normal and S2 normal.  Exam reveals gallop and S3.   Pulses:      Radial pulses are 2+ on the right side, and 2+ on the left side.       Dorsalis pedis pulses are 2+ on the right side, and 2+ on the left side.  Respiratory: Breath sounds normal. She has no wheezes. She has no rales.  On the vent   GI: Soft. She exhibits no distension. There is no tenderness.  Musculoskeletal: She exhibits no edema.  Neurological: She is alert. She exhibits normal muscle tone.  Intubated  Skin: Skin is warm and dry.    Assessment/Plan: 58 y.o. female with a PMH for severe ICM (EF 25-30%-09/02/13), extensive CAD (Last cath 09/03/13-chronic total occlusion of the LAD artery and moderate stenosis in a large second oblique marginal artery and severe disease in a small posterolateral ventricular artery. Cardiac MRI for viability showed LAD territory not viable), paroxysmal fibrillation with rapid RVR in May 2015(was on Xarelto), broad LBBB, HTN, hyperlipidemia, and previous stroke with some residual left hemiparesis. She also has a history of chronic alcohol abuse and acute pancreatitis.  SP She was admitted after ODing on beta blocker, Xarelto and possibly lorazepam in attempted suicide.  She was temporarily paced externally.  She is intubated but alert and responds to questions.   Principal Problem:   Overdose of beta-adrenergic antagonist drug Active Problems:   Hypotension   Suicide attempt by beta blocker overdose   CAD    Ischemic CM EF25-30%    PAF   LBBB-old   HLD   AKI   Hypothyroid   H/o ETOH abuse   Dopamine is at 10mcg and being weaned off.  After reviewing telemetry, HR is stable in the 60's.  Glucagon stopped. BP stable.   Maintaining  SR, QTc was 511ms last night.  Mag 1.5- Supplement.  Dig level negative.  No Xarelto.  INR  Was 5.87 and now 2.98.  HGB is stable.    She is net +2.2 liters.  Lungs are clear and no signs of acute CHF.  Continue to monitor for volume overload.  Potassium WNL  SCr has improved 1.60>>1.01   HAGER, BRYAN, PAC 10/04/2013, 12:56 PM   Patient seen with PA, agree with the above note.  She over-dosed this morning on her beta blocker, Xarelto, and possibly lorazepam.  She is on dopamine and required initial temporary pacing then glucagon.  She is off glucagon.  HR in 60s (NSR) and dopamine being titrated down. She is awake and alert, may be extubatable later today.  No chest pain.  She is not volume overloaded on exam.  Renal function improved with IV fluid.  - Titrate down dopamine for HR >/= 45 as long as SBP > 100.   - Once she is extubated, she will need psychiatric evaluation and suicide precautions.  Long-term Xarelto use   will be concerning given this event.   We will continue to follow.   [name] 10/04/2013 2:03 PM  

## 2013-10-04 NOTE — H&P (Signed)
PULMONARY / CRITICAL CARE MEDICINE HISTORY AND PHYSICAL EXAMINATION   Name: [name] MRN: [id number] DOB: 05/19/1955    ADMISSION DATE:  10/03/2013  PRIMARY SERVICE: PCCM  CHIEF COMPLAINT:  Bradycardia, hypotension, AMS  BRIEF PATIENT DESCRIPTION: 58yof with PMH of ETOH abuse, a fib, HTN, ICM with EF of 25-30% CVA, CAD who presents to ED via EMS for eval of AMS and bradycardia.       SIGNIFICANT EVENTS / STUDIES:  10/03/13: INR: 5.87, ETOH: 107   LINES / TUBES: 10/03/13: R IO in place  CULTURES: None  ANTIBIOTICS: None   SUBJECTIVE:  More awake and alert  VITAL SIGNS: Temp:  [91.4 F (33 C)-98.2 F (36.8 C)] 97.5 F (36.4 C) (09/14 1200) Pulse Rate:  [32-73] 62 (09/14 1200) Resp:  [11-31] 17 (09/14 1200) BP: (63-149)/(35-68) 141/42 mmHg (09/14 1200) SpO2:  [81 %-100 %] 100 % (09/14 1200) FiO2 (%):  [40 %-100 %] 40 % (09/14 0747) Weight:  [58 kg (127 lb 13.9 oz)-59.3 kg (130 lb 11.7 oz)] 59.3 kg (130 lb 11.7 oz) (09/13 2330) HEMODYNAMICS:   VENTILATOR SETTINGS: Vent Mode:  [-] PRVC FiO2 (%):  [40 %-100 %] 40 % Set Rate:  [16 bmp] 16 bmp Vt Set:  [450 mL] 450 mL PEEP:  [5 cmH20] 5 cmH20 Plateau Pressure:  [10 cmH20-16 cmH20] 15 cmH20 INTAKE / OUTPUT: Intake/Output     09/13 0701 - 09/14 0700 09/14 0701 - 09/15 0700   I.V. (mL/kg) 3190.9 (53.8) 747.5 (12.6)   NG/GT 60    IV Piggyback 605 300   Total Intake(mL/kg) 3855.9 (65) 1047.5 (17.7)   Urine (mL/kg/hr) 1550 360 (1.2)   Emesis/NG output 100    Total Output 1650 360   Net +2205.9 +687.5          PHYSICAL EXAMINATION:  Gen: sedated on vent but wakes to voice HEENT: NCAT, PERRL, ETT in place PULM: Diminished R base, crackles CV: RRR, S1/S2 no JVD AB: BS+, soft, nontender Ext: warm, no ankle edema, no clubbing, no cyanosis Derm: no rash or skin breakdown Neuro: somnolent but arouses, follows commands  LABS:  CBC  Recent Labs Lab 10/03/13 2045 10/03/13 2058 10/04/13 0020  WBC 3.9*  --   10.4  HGB 8.5* 8.8* 9.6*  HCT 26.0* 26.0* 29.5*  PLT 219  --  295   Coag's  Recent Labs Lab 10/03/13 2045 10/04/13 0148 10/04/13 0900  INR 5.87* 4.28* 2.98*   BMET  Recent Labs Lab 10/03/13 2045 10/03/13 2058 10/04/13 0020  NA 140 142 144  K 3.4* 3.2* 3.7  CL 104 107 108  CO2 17*  --  18*  BUN 12 10 12   CREATININE 1.31* 1.60* 1.01  GLUCOSE 175* 174* 145*   Electrolytes  Recent Labs Lab 10/03/13 2045 10/04/13 0020  CALCIUM 8.1* 7.6*  MG  --  1.5  PHOS  --  2.4   Sepsis Markers  Recent Labs Lab 10/03/13 2035  LATICACIDVEN 4.71*   ABG  Recent Labs Lab 10/03/13 2117 10/04/13 0330  PHART 7.297* 7.334*  PCO2ART 40.0 36.5  PO2ART 386.0* 154.0*   Liver Enzymes  Recent Labs Lab 10/03/13 2045  AST 20  ALT 14  ALKPHOS 95  BILITOT <0.2*  ALBUMIN 2.8*   Cardiac Enzymes No results found for this basename: TROPONINI, PROBNP,  in the last 168 hours Glucose No results found for this basename: GLUCAP,  in the last 168 hours  Imaging Dg Chest Port 1 View  10/03/2013   CLINICAL  DATA:  Drug overdose.  Suicidal ideation.  EXAM: PORTABLE CHEST - 1 VIEW  COMPARISON:  Chest radiograph performed 09/02/2013  FINDINGS: The patient's endotracheal tube is seen ending 5 cm above the carina. An enteric tube is noted extending below the diaphragm.  The lungs are well-aerated and clear, though the left lung is difficult to fully assess due to overlying pacing pads. There is no evidence of focal opacification, pleural effusion or pneumothorax.  The cardiomediastinal silhouette is borderline normal in size. No acute osseous abnormalities are seen. External pacing pads are noted.  IMPRESSION: 1. Endotracheal tube seen ending 5 cm above the carina. 2. No acute cardiopulmonary process seen.   Electronically Signed   By: Garald Balding M.D.   On: 10/03/2013 21:37    EKG: Sinus brady with prolonged qt CXR: No acute airspace disease, overall normal with exception of ETT in  place  ASSESSMENT / PLAN:  Principal Problem:   Overdose of beta-adrenergic antagonist drug Active Problems:   Hypotension   Suicide attempt by beta blocker overdose  TOXICOLOGY A: Overdose, unknown medications but suspect coreg and xarelto APAP, dig levels negative P: Continue supportive care Continue to f/u with poision control  PULMONARY A: Acute hypercarbic respiratory failure due to drug overdose  P:   PSV now, if can tolerate being off glucagon for 2 hours then will likely extubate CXR now  VAP bundle  CARDIOVASCULAR A: Bradycardia due to carvedilol OD.   Known ischemic cardiomyopathy > EF 25%, not in exacerbation Shock> due to coreg OD  9/13 R femoral CVL >  P:   Hold glucagon KVO fluids Maintain dopamine for MAP > 65 Hold home medications Consider IJ CVL when INR normalized Cardiology consult  RENAL A: No acute issues P:   Monitor UOP  GASTROINTESTINAL A: EtOH abuse > cirrhosis? P:   Check LFTs  Check ammonia Start tube feedings if not extubated today  HEMATOLOGIC A: Elevated INR, suspect 2/2 Xarelto OD  P:   Q6hr coags for now Low threshold for head CT if clinical picture changes  INFECTIOUS A: No acute issus P:   Monitor for signs of developing infection  ENDOCRINE A: Hypothyroidism  > notes show currently undertreated  P:   Restart home synthroid  NEUROLOGIC A: AMS 2/2 drug OD Suicide attempt P:   Fentanyl gtt for RASS goal 0 Will need suicide precautions and psyche eval post extubation   BEST PRACTICE / DISPOSITION   TODAY'S SUMMARY: 58 y/o female with etoh abuse, CHF who attempted suicide with coreg and Xarelto overdose.  Slowly improving.  If not able to extubate today will start tube feeds, continue supportive care and hope for extubation on 9/15.  Holding glucagon 9/14.  I have personally obtained a history, examined the patient, evaluated laboratory and imaging results, formulated the assessment and plan and  placed orders.  CRITICAL CARE: The patient is critically ill with multiple organ systems failure and requires high complexity decision making for assessment and support, frequent evaluation and titration of therapies, application of advanced monitoring technologies and extensive interpretation of multiple databases. Critical Care Time devoted to patient care services described in this note is  45 minutes.   Roselie Awkward, MD [place] PCCM Pager: [phone] Cell: [phone] If no response, call [phone]    10/04/2013, 12:13 PM

## 2013-10-04 NOTE — Procedures (Signed)
Central Venous Catheter Insertion Procedure Note [name] [id number] [birth year]  Procedure: Insertion of Central Venous Catheter Indications: Drug and/or fluid administration and Frequent blood sampling  Procedure Details Consent: Unable to obtain consent because of emergent medical necessity., AMS. Time Out: Verified patient identification, verified procedure, site/side was marked, verified correct patient position, special equipment/implants available, medications/allergies/relevent history reviewed, required imaging and test results available.  Performed  Maximum sterile technique was used including antiseptics, cap, gloves, gown, hand hygiene, mask and sheet. Skin prep: Chlorhexidine; local anesthetic administered A antimicrobial bonded/coated triple lumen catheter was placed in the right femoral vein due to elevated INR using the Seldinger technique.  Evaluation Blood flow good Complications: No apparent complications Patient did tolerate procedure well. Line verified for usage.  Thad Ranger 10/04/2013, 1:16 AM

## 2013-10-04 NOTE — Progress Notes (Signed)
CARE MANAGEMENT NOTE 10/04/2013  Patient:  [name],[name]   Account Number:  [id number]  Date Initiated:  10/04/2013  Documentation initiated by:  DAVIS,RHONDA  Subjective/Objective Assessment:   overdose of prescription beta blockers/bradycardiac,ams and airway protection required.     Action/Plan:   tbd depending of course of stay and psych eval.   Anticipated DC Date:  10/07/2013   Anticipated DC Plan:    In-house referral  Clinical Social Worker      DC Planning Services  CM consult      [facility] Choice  NA   Choice offered to / List presented to:  NA   DME arranged  NA      DME agency  NA     [place] arranged  NA      [place] agency  NA   Status of service:  In process, will continue to follow Medicare Important Message given?  NA - LOS <3 / Initial given by admissions (If response is "NO", the following Medicare IM given date fields will be blank) Date Medicare IM given:   Medicare IM given by:   Date Additional Medicare IM given:   Additional Medicare IM given by:    Discharge Disposition:    Per UR Regulation:  Reviewed for med. necessity/level of care/duration of stay  If discussed at [place] of Stay Meetings, dates discussed:    Comments:  Suanne Marker Davis,RN,BSN,CCM

## 2013-10-04 NOTE — Progress Notes (Signed)
INITIAL NUTRITION ASSESSMENT  DOCUMENTATION CODES Per approved criteria  -Not Applicable   INTERVENTION: Initiate Vital AF 1.2 @ 25 ml/hr via OG tube and increase by 10 ml every 4 hours to goal rate of 35 ml/hr.   30 ml Prostat daily.    MVI daily  Tube feeding regimen provides 1108 kcal (95% of needs), 78 grams of protein, and 681 ml of H2O.   NUTRITION DIAGNOSIS: Inadequate oral intake related to inability to eat as evidenced by NPO status  Goal: Pt to meet >/= 90% of their estimated nutrition needs   Monitor:  Respiratory status, TF initiation and tolerance, weight trends, labs  Reason for Assessment: Consult received to initiate and manage enteral nutrition support.  58 y.o. female  Admitting Dx: Overdose of beta-adrenergic antagonist drug  ASSESSMENT: 58 year old with hx of ETOH abuse admitted after suicide attempt with coreg and xarelto overdose. Pt remains intubated. Per MD nutrition can start tube feedings. Pt may be extubated 9/15.  Patient is currently intubated on ventilator support MV: 9 L/min Temp (24hrs), Avg:96.7 F (35.9 C), Min:91.4 F (33 C), Max:98.2 F (36.8 C)  No signs of fat or muscle depletion.  Pt able to nod head yes or no and answers no to weight loss and yes to good appetite.    Height: Ht Readings from Last 1 Encounters:  10/03/13 5\' 3"  (1.6 m)    Weight: Wt Readings from Last 1 Encounters:  10/03/13 130 lb 11.7 oz (59.3 kg)    Ideal Body Weight: 52.2 kg   % Ideal Body Weight: 114%  Wt Readings from Last 10 Encounters:  10/03/13 130 lb 11.7 oz (59.3 kg)  09/24/13 127 lb 12.8 oz (57.97 kg)  09/21/13 127 lb 4 oz (57.72 kg)  09/07/13 124 lb 14.4 oz (56.654 kg)  09/07/13 124 lb 14.4 oz (56.654 kg)  08/17/13 126 lb 4 oz (57.267 kg)  08/05/13 131 lb 2 oz (59.478 kg)  06/30/13 130 lb 8 oz (59.194 kg)  06/16/13 132 lb 4 oz (59.988 kg)  06/08/13 132 lb 4.8 oz (60.011 kg)    Usual Body Weight: 125-130 lb   % Usual Body  Weight: 100%  BMI:  Body mass index is 23.16 kg/(m^2).  Estimated Nutritional Needs: Kcal: 1162 Protein: 75-90 grams Fluid: >1.5 L/day  Skin: right elbow  Diet Order: NPO  EDUCATION NEEDS: -No education needs identified at this time   Intake/Output Summary (Last 24 hours) at 10/04/13 1010 Last data filed at 10/04/13 0900  Gross per 24 hour  Intake 4252.1 ml  Output   1650 ml  Net 2602.1 ml    Last BM: PTA   Labs:   Recent Labs Lab 10/03/13 2045 10/03/13 2058 10/04/13 0020  NA 140 142 144  K 3.4* 3.2* 3.7  CL 104 107 108  CO2 17*  --  18*  BUN 12 10 12   CREATININE 1.31* 1.60* 1.01  CALCIUM 8.1*  --  7.6*  MG  --   --  1.5  PHOS  --   --  2.4  GLUCOSE 175* 174* 145*    CBG (last 3)  No results found for this basename: GLUCAP,  in the last 72 hours  Scheduled Meds: . antiseptic oral rinse  7 mL Mouth Rinse QID  . chlorhexidine  15 mL Mouth Rinse BID  . famotidine (PEPCID) IV  20 mg Intravenous Q12H  . fentaNYL  50 mcg Intravenous Once  . midazolam  2 mg Intravenous Once  Continuous Infusions: . sodium chloride 1,000 mL (10/04/13 0800)  . DOPamine 14 mcg/kg/min (10/04/13 0800)  . fentaNYL infusion INTRAVENOUS 100 mcg/hr (10/04/13 0800)  . glucagon (GLUCAGEN) infusion (for beta blocker/calcium channel blocker overdose) 5 mg/hr (10/04/13 0800)  . norepinephrine (LEVOPHED) Adult infusion Stopped (10/04/13 0316)    Past Medical History  Diagnosis Date  . Diverticulosis   . Pancreatitis   . Hypertension   . Stroke 2011  . Hyperlipidemia   . Anxiety   . GERD (gastroesophageal reflux disease)   . ETOH abuse   . chronic systolic heart failure     a. ECHO (05/08/13): EF 20-25%, diff HK with akinesis of basal inferior wall, grade 1 DD, trivial MR, trivial TR b) RHC (05/06/13): RA 7, RV 30/2, PA 31/19 (24), PCWP 10, Fick CO/CI: 2.9/1.74, Thermo CO/Ci: 2.4/1.4, PA sat 53%  . Ischemic cardiomyopathy   . CAD (coronary artery disease)     a. LHC (04/2013):  Chronically occluded LAD, moderate dz in large OM1 and severe dz and small posterior lateral vessel    . A-fib     Past Surgical History  Procedure Laterality Date  . Colostomy    . Ileostomy    . Colostomy takedown      [place], [place], [place] Pager [phone] After Hours Pager

## 2013-10-04 NOTE — Procedures (Signed)
Arterial Catheter Insertion Procedure Note [name] [id number] [birth year]  Procedure: Insertion of Arterial Catheter  Indications: Blood pressure monitoring  Procedure Details Consent: Unable to obtain consent because of altered level of consciousness. Time Out: Verified patient identification, verified procedure, site/side was marked, verified correct patient position, special equipment/implants available, medications/allergies/relevent history reviewed, required imaging and test results available.  Performed  Maximum sterile technique was used including antiseptics, cap, gloves, gown, hand hygiene, mask and sheet. Skin prep: Chlorhexidine; local anesthetic administered 20 gauge catheter was inserted into left radial artery using the Seldinger technique.  Evaluation Blood flow good; BP tracing good. Complications: No apparent complications.  Assisted by Marita Snellen CRT,RCP   Clare Gandy, Chales Abrahams 10/04/2013

## 2013-10-05 ENCOUNTER — Inpatient Hospital Stay (HOSPITAL_COMMUNITY): Payer: Medicaid Other

## 2013-10-05 DIAGNOSIS — I509 Heart failure, unspecified: Secondary | ICD-10-CM

## 2013-10-05 DIAGNOSIS — I4891 Unspecified atrial fibrillation: Secondary | ICD-10-CM

## 2013-10-05 DIAGNOSIS — I5023 Acute on chronic systolic (congestive) heart failure: Secondary | ICD-10-CM

## 2013-10-05 DIAGNOSIS — N189 Chronic kidney disease, unspecified: Secondary | ICD-10-CM

## 2013-10-05 DIAGNOSIS — I2510 Atherosclerotic heart disease of native coronary artery without angina pectoris: Secondary | ICD-10-CM

## 2013-10-05 DIAGNOSIS — N289 Disorder of kidney and ureter, unspecified: Secondary | ICD-10-CM

## 2013-10-05 LAB — GLUCOSE, CAPILLARY
GLUCOSE-CAPILLARY: 127 mg/dL — AB (ref 70–99)
GLUCOSE-CAPILLARY: 161 mg/dL — AB (ref 70–99)
Glucose-Capillary: 84 mg/dL (ref 70–99)
Glucose-Capillary: 131 mg/dL — ABNORMAL HIGH (ref 70–99)
Glucose-Capillary: 148 mg/dL — ABNORMAL HIGH (ref 70–99)
Glucose-Capillary: 86 mg/dL (ref 70–99)
Glucose-Capillary: 98 mg/dL (ref 70–99)

## 2013-10-05 LAB — CBC
HCT: 28.8 % — ABNORMAL LOW (ref 36.0–46.0)
Hemoglobin: 9.6 g/dL — ABNORMAL LOW (ref 12.0–15.0)
MCH: 30.1 pg (ref 26.0–34.0)
MCHC: 33.3 g/dL (ref 30.0–36.0)
MCV: 90.3 fL (ref 78.0–100.0)
PLATELETS: 261 10*3/uL (ref 150–400)
RBC: 3.19 MIL/uL — ABNORMAL LOW (ref 3.87–5.11)
RDW: 13.8 % (ref 11.5–15.5)
WBC: 14 10*3/uL — ABNORMAL HIGH (ref 4.0–10.5)

## 2013-10-05 LAB — BASIC METABOLIC PANEL
Anion gap: 13 (ref 5–15)
BUN: 15 mg/dL (ref 6–23)
CALCIUM: 7.6 mg/dL — AB (ref 8.4–10.5)
CO2: 18 mEq/L — ABNORMAL LOW (ref 19–32)
CREATININE: 0.93 mg/dL (ref 0.50–1.10)
Chloride: 105 mEq/L (ref 96–112)
GFR calc Af Amer: 77 mL/min — ABNORMAL LOW (ref >90)
GFR calc non Af Amer: 66 mL/min — ABNORMAL LOW (ref >90)
Glucose, Bld: 134 mg/dL — ABNORMAL HIGH (ref 70–99)
Potassium: 3.8 mEq/L (ref 3.7–5.3)
Sodium: 136 mEq/L — ABNORMAL LOW (ref 137–147)

## 2013-10-05 LAB — PROTIME-INR
INR: 2.48 — AB (ref 0.00–1.49)
INR: 2.26 — AB (ref 0.00–1.49)
INR: 2.63 — ABNORMAL HIGH (ref 0.00–1.49)
INR: 4.27 — ABNORMAL HIGH (ref 0.00–1.49)
Prothrombin Time: 26.8 seconds — ABNORMAL HIGH (ref 11.6–15.2)
Prothrombin Time: 25 seconds — ABNORMAL HIGH (ref 11.6–15.2)
Prothrombin Time: 28.1 seconds — ABNORMAL HIGH (ref 11.6–15.2)
Prothrombin Time: 41 seconds — ABNORMAL HIGH (ref 11.6–15.2)

## 2013-10-05 MED ORDER — ATORVASTATIN CALCIUM 80 MG PO TABS
80.0000 mg | ORAL_TABLET | Freq: Every day | ORAL | Status: DC
  Administered 2013-10-06 – 2013-10-12 (×7): 80 mg via ORAL
  Filled 2013-10-05 (×9): qty 1

## 2013-10-05 MED ORDER — IPRATROPIUM BROMIDE 0.02 % IN SOLN: 0.5000 mg | RESPIRATORY_TRACT | Status: DC | PRN

## 2013-10-05 MED ORDER — ADULT MULTIVITAMIN W/MINERALS CH
1.0000 | ORAL_TABLET | Freq: Every day | ORAL | Status: DC
  Administered 2013-10-06 – 2013-10-13 (×8): 1 via ORAL
  Filled 2013-10-05 (×9): qty 1

## 2013-10-05 MED ORDER — IPRATROPIUM BROMIDE 0.02 % IN SOLN
0.5000 mg | Freq: Four times a day (QID) | RESPIRATORY_TRACT | Status: DC
  Filled 2013-10-05: qty 2.5

## 2013-10-05 MED ORDER — METOCLOPRAMIDE HCL 5 MG/ML IJ SOLN
10.0000 mg | Freq: Once | INTRAMUSCULAR | Status: AC
  Administered 2013-10-05: 10 mg via INTRAVENOUS
  Filled 2013-10-05: qty 2

## 2013-10-05 MED ORDER — FUROSEMIDE 10 MG/ML IJ SOLN
40.0000 mg | Freq: Once | INTRAMUSCULAR | Status: AC
  Administered 2013-10-05: 40 mg via INTRAVENOUS
  Filled 2013-10-05: qty 4

## 2013-10-05 NOTE — Progress Notes (Signed)
PULMONARY / CRITICAL CARE MEDICINE HISTORY AND PHYSICAL EXAMINATION   Name: [name] MRN: [id number] DOB: 1955/04/15    ADMISSION DATE:  10/03/2013  PRIMARY SERVICE: PCCM  CHIEF COMPLAINT:  Bradycardia, hypotension, AMS  BRIEF PATIENT DESCRIPTION: 58yof with PMH of ETOH abuse, a fib, HTN, ICM with EF of 25-30% CVA, CAD who presents to ED via EMS for eval of AMS and bradycardia.   ?BB OD.  SIGNIFICANT EVENTS / STUDIES:  10/03/13: INR: 5.87, ETOH: 107  LINES / TUBES: 10/03/13: R IO in place  CULTURES: None  ANTIBIOTICS: None  SUBJECTIVE:  More awake and alert, interactive and following commands.  VITAL SIGNS: Temp:  [97.3 F (36.3 C)-99.1 F (37.3 C)] 99.1 F (37.3 C) (09/15 0700) Pulse Rate:  [54-76] 60 (09/15 0700) Resp:  [0-32] 16 (09/15 0700) BP: (87-170)/(32-72) 121/49 mmHg (09/15 0400) SpO2:  [91 %-100 %] 99 % (09/15 0700) FiO2 (%):  [40 %-100 %] 40 % (09/15 0810) Weight:  [61.4 kg (135 lb 5.8 oz)] 61.4 kg (135 lb 5.8 oz) (09/15 0500)  HEMODYNAMICS:   VENTILATOR SETTINGS: Vent Mode:  [-] PSV;CPAP FiO2 (%):  [40 %-100 %] 40 % Set Rate:  [10 bmp-16 bmp] 10 bmp Vt Set:  [450 mL] 450 mL PEEP:  [5 cmH20] 5 cmH20 Pressure Support:  [5 cmH20-40 cmH20] 5 cmH20 Plateau Pressure:  [14 cmH20-18 cmH20] 18 cmH20  INTAKE / OUTPUT: Intake/Output     09/14 0701 - 09/15 0700 09/15 0701 - 09/16 0700   I.V. (mL/kg) 1480.7 (24.1)    NG/GT 407.1    IV Piggyback 425    Total Intake(mL/kg) 2312.7 (37.7)    Urine (mL/kg/hr) 1410 (1)    Emesis/NG output     Total Output 1410     Net +902.7           PHYSICAL EXAMINATION:  Gen: on vent but awake and interactive HEENT: NCAT, PERRL, ETT in place PULM: Diminished R base, crackles CV: RRR, S1/S2 no JVD AB: BS+, soft, nontender Ext: warm, no ankle edema, no clubbing, no cyanosis Derm: no rash or skin breakdown Neuro: alert and interactive, following all commands.  LABS:  CBC  Recent Labs Lab 10/04/13 0020  10/04/13 1300 10/05/13 0800  WBC 10.4 14.1* 14.0*  HGB 9.6* 9.7* 9.6*  HCT 29.5* 29.3* 28.8*  PLT 295 258 261   Coag's  Recent Labs Lab 10/04/13 2000 10/05/13 0310 10/05/13 0748  INR 2.48* 2.48* 2.26*   BMET  Recent Labs Lab 10/04/13 0020 10/04/13 1300 10/05/13 0310  NA 144 137 136*  K 3.7 4.1 3.8  CL 108 104 105  CO2 18* 17* 18*  BUN 12 16 15   CREATININE 1.01 0.97 0.93  GLUCOSE 145* 103* 134*   Electrolytes  Recent Labs Lab 10/04/13 0020 10/04/13 1300 10/05/13 0310  CALCIUM 7.6* 7.7* 7.6*  MG 1.5  --   --   PHOS 2.4  --   --    Sepsis Markers  Recent Labs Lab 10/03/13 2035  LATICACIDVEN 4.71*   ABG  Recent Labs Lab 10/03/13 2117 10/04/13 0330  PHART 7.297* 7.334*  PCO2ART 40.0 36.5  PO2ART 386.0* 154.0*   Liver Enzymes  Recent Labs Lab 10/03/13 2045 10/04/13 1300  AST 20 24  ALT 14 17  ALKPHOS 95 110  BILITOT <0.2* 0.4  ALBUMIN 2.8* 3.2*   Cardiac Enzymes No results found for this basename: TROPONINI, PROBNP,  in the last 168 hours Glucose  Recent Labs Lab 10/04/13 1707 10/04/13 1746 10/04/13  2004 10/05/13 0056 10/05/13 0318  GLUCAP 62* 141* 98 84 127*    Imaging Dg Chest Port 1 View  10/05/2013   CLINICAL DATA:  Bradycardia and hypotension.  EXAM: PORTABLE CHEST - 1 VIEW  COMPARISON:  10/04/2013  FINDINGS: Endotracheal tube remains with the tip approximately 2 cm above the carina. External pacing pad remains. Lungs show minimal atelectasis at the right base. There is no evidence of pulmonary edema, consolidation, pneumothorax, nodule or pleural fluid. The heart size is normal.  IMPRESSION: Minimal right basilar atelectasis.   Electronically Signed   By: Aletta Edouard M.D.   On: 10/05/2013 08:13   Dg Chest Port 1 View  10/04/2013   CLINICAL DATA:  Congestive heart failure. Assess for endotracheal tube placement.  EXAM: PORTABLE CHEST - 1 VIEW  COMPARISON:  October 03, 2013.  FINDINGS: An endotracheal tube is identified  with distal tip 2.2 cm from carina. Retraction by 2 cm is recommended. Nasogastric tube is unchanged. The lungs are clear. There is no focal infiltrate, pulmonary edema, or pleural effusion. The heart size is normal. The bony structures are stable.  IMPRESSION: Endotracheal tube is identified with distal tip 2.2 cm from carina. Retraction by 2 cm recommended. There is no pneumothorax.   Electronically Signed   By: Abelardo Diesel M.D.   On: 10/04/2013 13:34   Dg Chest Port 1 View  10/03/2013   CLINICAL DATA:  Drug overdose.  Suicidal ideation.  EXAM: PORTABLE CHEST - 1 VIEW  COMPARISON:  Chest radiograph performed 09/02/2013  FINDINGS: The patient's endotracheal tube is seen ending 5 cm above the carina. An enteric tube is noted extending below the diaphragm.  The lungs are well-aerated and clear, though the left lung is difficult to fully assess due to overlying pacing pads. There is no evidence of focal opacification, pleural effusion or pneumothorax.  The cardiomediastinal silhouette is borderline normal in size. No acute osseous abnormalities are seen. External pacing pads are noted.  IMPRESSION: 1. Endotracheal tube seen ending 5 cm above the carina. 2. No acute cardiopulmonary process seen.   Electronically Signed   By: Garald Balding M.D.   On: 10/03/2013 21:37    EKG: Sinus brady with prolonged qt CXR: No acute airspace disease, overall normal with exception of ETT in place  ASSESSMENT / PLAN:  Principal Problem:   Overdose of beta-adrenergic antagonist drug Active Problems:   Hypotension   Suicide attempt by beta blocker overdose  TOXICOLOGY A: Overdose, unknown medications but suspect coreg and xarelto APAP, dig levels negative P: Continue supportive care Dopamine for likely coreg OD  PULMONARY A: Acute hypercarbic respiratory failure due to drug overdose  P:   Extubate today Titrate O2 for sats Once off pressors then will remove TLC and mobilize. IS per rt  protocol.  CARDIOVASCULAR A: Bradycardia due to carvedilol OD.   Known ischemic cardiomyopathy > EF 25%, not in exacerbation Shock> due to coreg OD  9/13 R femoral CVL >  P:   D/C glucagon KVO fluids Maintain dopamine for MAP > 65 Hold home medications Will remove TLC once off dopamine if able Dopamine for BP support. Cardiology consult appreciated.  RENAL A: No acute issues P:   Monitor UOP BMET daily. Replace electrolytes as indicated.  GASTROINTESTINAL A: EtOH abuse > cirrhosis? P:   F/U LFTs  Ammonia level of 21 D/C TF Begin diet, if a concern will order a swallow evaluation  HEMATOLOGIC A: Elevated INR, suspect 2/2 Xarelto OD  P:  Q6hr coags for now Low threshold for head CT if clinical exam changes  INFECTIOUS A: No acute issus P:   Monitor for signs of developing infection  ENDOCRINE A: Hypothyroidism  > notes show currently undertreated  P:   Continue home synthroid  NEUROLOGIC A: AMS 2/2 drug OD Suicide attempt P:   Fentanyl gtt for RASS goal 0 Suicide precautions post extubation   TODAY'S SUMMARY: 58 y/o female with etoh abuse, CHF who attempted suicide with coreg and Xarelto overdose.  Much more alert and interactive, will extubate today, once off dopamine will consider mobilization and OOB to chair.  I have personally obtained a history, examined the patient, evaluated laboratory and imaging results, formulated the assessment and plan and placed orders.  CRITICAL CARE: The patient is critically ill with multiple organ systems failure and requires high complexity decision making for assessment and support, frequent evaluation and titration of therapies, application of advanced monitoring technologies and extensive interpretation of multiple databases. Critical Care Time devoted to patient care services described in this note is  45 minutes.   Rush Farmer, M.D. [facility] Pulmonary/Critical Care Medicine. Pager: [phone]. After  hours pager: [phone].  10/05/2013, 10:08 AM

## 2013-10-05 NOTE — Progress Notes (Signed)
   C.o nausea   Plan reglan 10mg  IV x 1  Estimated Creatinine Clearance: 54.5 ml/min (by C-G formula based on Cr of 0.93).  Recent Labs Lab 10/03/13 2045 10/03/13 2058 10/04/13 0020 10/04/13 1300 10/05/13 0310  CREATININE 1.31* 1.60* 1.01 0.97 0.93

## 2013-10-05 NOTE — Progress Notes (Addendum)
Patient ID: [name], female   DOB: 02/17/1955, 58 y.o.   MRN: [id number]   SUBJECTIVE: Patient asleep this morning.  BP labile overnight in setting of sedation.  Oxygen saturation dropped with turning.    Scheduled Meds: . antiseptic oral rinse  7 mL Mouth Rinse QID  . atorvastatin  80 mg Oral q1800  . chlorhexidine  15 mL Mouth Rinse BID  . famotidine (PEPCID) IV  20 mg Intravenous Q12H  . feeding supplement (PRO-STAT SUGAR FREE 64)  30 mL Per Tube Daily  . fentaNYL  50 mcg Intravenous Once  . furosemide  40 mg Intravenous Once  . ipratropium  0.5 mg Nebulization Q6H  . levothyroxine  12.5 mcg Oral QAC breakfast  . midazolam  2 mg Intravenous Once  . multivitamin  5 mL Per Tube Daily  . pantoprazole (PROTONIX) IV  40 mg Intravenous QHS   Continuous Infusions: . DOPamine 10 mcg/kg/min (10/05/13 0300)  . feeding supplement (VITAL AF 1.2 CAL) 1,000 mL (10/04/13 2300)  . fentaNYL infusion INTRAVENOUS 125 mcg/hr (10/05/13 0515)  . glucagon (GLUCAGEN) infusion (for beta blocker/calcium channel blocker overdose) Stopped (10/04/13 1225)  . norepinephrine (LEVOPHED) Adult infusion Stopped (10/04/13 0316)   PRN Meds:.sodium chloride, albuterol, docusate sodium, fentaNYL, fentaNYL, fentaNYL, fentaNYL    Filed Vitals:   10/05/13 0400 10/05/13 0500 10/05/13 0600 10/05/13 0700  BP: 121/49     Pulse: 69 58 76 60  Temp: 99 F (37.2 C) 99 F (37.2 C) 99 F (37.2 C) 99.1 F (37.3 C)  TempSrc:      Resp: 16 16 15 16   Height:      Weight:  135 lb 5.8 oz (61.4 kg)    SpO2: 100% 100% 91% 99%    Intake/Output Summary (Last 24 hours) at 10/05/13 0727 Last data filed at 10/05/13 0700  Gross per 24 hour  Intake 2312.74 ml  Output   1410 ml  Net 902.74 ml    LABS: Basic Metabolic Panel:  Recent Labs  10/03/13 2058 10/04/13 0020 10/04/13 1300 10/05/13 0310  NA 142 144 137 136*  K 3.2* 3.7 4.1 3.8  CL 107 108 104 105  CO2  --  18* 17* 18*  GLUCOSE 174* 145* 103* 134*  BUN  10 12 16 15   CREATININE 1.60* 1.01 0.97 0.93  CALCIUM  --  7.6* 7.7* 7.6*  MG  --  1.5  --   --   PHOS  --  2.4  --   --    Liver Function Tests:  Recent Labs  10/03/13 2045 10/04/13 1300  AST 20 24  ALT 14 17  ALKPHOS 95 110  BILITOT <0.2* 0.4  PROT 5.8* 6.5  ALBUMIN 2.8* 3.2*   No results found for this basename: LIPASE, AMYLASE,  in the last 72 hours CBC:  Recent Labs  10/03/13 2045  10/04/13 0020 10/04/13 1300  WBC 3.9*  --  10.4 14.1*  NEUTROABS 2.1  --   --  12.4*  HGB 8.5*  < > 9.6* 9.7*  HCT 26.0*  < > 29.5* 29.3*  MCV 92.2  --  91.0 88.8  PLT 219  --  295 258  < > = values in this interval not displayed. Cardiac Enzymes: No results found for this basename: CKTOTAL, CKMB, CKMBINDEX, TROPONINI,  in the last 72 hours BNP: No components found with this basename: POCBNP,  D-Dimer: No results found for this basename: DDIMER,  in the last 72 hours Hemoglobin A1C: No results  found for this basename: HGBA1C,  in the last 72 hours Fasting Lipid Panel: No results found for this basename: CHOL, HDL, LDLCALC, TRIG, CHOLHDL, LDLDIRECT,  in the last 72 hours Thyroid Function Tests:  Recent Labs  10/04/13 0020  TSH 10.630*   Anemia Panel: No results found for this basename: VITAMINB12, FOLATE, FERRITIN, TIBC, IRON, RETICCTPCT,  in the last 72 hours  RADIOLOGY: Dg Chest Port 1 View  10/04/2013   CLINICAL DATA:  Congestive heart failure. Assess for endotracheal tube placement.  EXAM: PORTABLE CHEST - 1 VIEW  COMPARISON:  October 03, 2013.  FINDINGS: An endotracheal tube is identified with distal tip 2.2 cm from carina. Retraction by 2 cm is recommended. Nasogastric tube is unchanged. The lungs are clear. There is no focal infiltrate, pulmonary edema, or pleural effusion. The heart size is normal. The bony structures are stable.  IMPRESSION: Endotracheal tube is identified with distal tip 2.2 cm from carina. Retraction by 2 cm recommended. There is no pneumothorax.    Electronically Signed   By: Abelardo Diesel M.D.   On: 10/04/2013 13:34   Dg Chest Port 1 View  10/03/2013   CLINICAL DATA:  Drug overdose.  Suicidal ideation.  EXAM: PORTABLE CHEST - 1 VIEW  COMPARISON:  Chest radiograph performed 09/02/2013  FINDINGS: The patient's endotracheal tube is seen ending 5 cm above the carina. An enteric tube is noted extending below the diaphragm.  The lungs are well-aerated and clear, though the left lung is difficult to fully assess due to overlying pacing pads. There is no evidence of focal opacification, pleural effusion or pneumothorax.  The cardiomediastinal silhouette is borderline normal in size. No acute osseous abnormalities are seen. External pacing pads are noted.  IMPRESSION: 1. Endotracheal tube seen ending 5 cm above the carina. 2. No acute cardiopulmonary process seen.   Electronically Signed   By: Garald Balding M.D.   On: 10/03/2013 21:37   Mr Card Morphology Wo/w Cm  09/07/2013   CLINICAL DATA:  CAD, evaluate for viability  EXAM: CARDIAC MRI  TECHNIQUE: The patient was scanned on a 1.5 Tesla GE magnet. A dedicated cardiac coil was used. Functional imaging was done using Fiesta sequences. 2,3, and 4 chamber views were done to assess for RWMA's. Modified Simpson's rule using a short axis stack was used to calculate an ejection fraction on a dedicated work Conservation officer, nature. The patient received 20 cc of Multihance. After 10 minutes inversion recovery sequences were used to assess for infiltration and scar tissue.  CONTRAST:  20 cc  of Multihance  FINDINGS: 1. Mildly dilated left ventricular size with normal thickness and severely impaired systolic function (LVEF = 28%).  There are regional wall motion abnormalities with severe hypokinesis of the entire anterior and inferior wall, basal and mid inferoseptal walls and there is paradoxical septal motion.  The measurements are as follows:  LVEDD:  56 mm  LVESD:  51 mm  LVEDV:  137 ml  LVESV:  99 ml  SV:  38  ml  CO:  2.2 L/minute  Myocardial mass:  91 g  2. Normal right ventricular size, thickness and systolic function (RVEF = 53%).  RVEDV:  62 ml  RVESV:  29 ml  3. Mild mitral regurgitation.  Normal bi-atrial size.  Normal size aortic root, ascending aorta and pulmonary artery.  4.  There is late gadolinium enhancement in these segments:  Basal anterior:  25-50% (good prognosis)  Mid anterior:  50-75% (fair prognosis)  Apical  anterior:  50-75% (fair prognosis)  Basal inferior:  75-100% (poor prognosis)  Mid inferior: 75-100% (poor prognosis)  Apical inferior: 75-100% (poor prognosis)  Basal inferoseptal: 75-100% (poor prognosis)  IMPRESSION: 1. Mildly dilated left ventricular size with normal thickness and severely impaired systolic function (LVEF = 28%).  There are regional wall motion abnormalities with severe hypokinesis of the entire anterior and inferior wall, basal and mid inferoseptal walls and there is paradoxical septal motion.  2. Normal right ventricular size, thickness and systolic function (RVEF = 53%).  3. Conclusively, these findings are consistent with predominantly non-viable myocardium in the LAD territory.  Ena Dawley   Electronically Signed   By: Ena Dawley   On: 09/07/2013 10:26    PHYSICAL EXAM General: NAD Neck: JVP 10 cm, no thyromegaly or thyroid nodule.  Lungs: Dependent crackles.  CV: Nondisplaced PMI.  Heart regular S1/S2, no S3/S4, no murmur.  No peripheral edema.  No carotid bruit.  Normal pedal pulses.  Abdomen: Soft, nontender, no hepatosplenomegaly, no distention.  Neurologic: Sleeping but has been alert/responsive.   Extremities: No clubbing or cyanosis.   TELEMETRY: Reviewed telemetry pt in NSR  ASSESSMENT AND PLAN: 58 yo with history of CAD, ischemic CMP, and ETOH abuse was admitted after she overdosed on beta blocker, Xarelto, and ?lorazepam.  1. Pulmonary: Desaturating overnight, has been on NS 125 cc/hr and JVP is up, so suspect some volume overload now.   Will given 1 dose IV Lasix and follow response.  Stop NS.  Will get CXR this morning.  2. Hypotension: Initially overdose-related (beta blocker), sedation also likely playing a role.  Titrate down dopamine today as able.   3. Bradycardia: Remains on dopamine with HR around 60.  Titrate down today.  4. CAD: No chest pain.  Non-rescularizable disease.  Can restart atorvastatin.  5. CHF: Acute on chronic systolic CHF.  She has built up some volume overload.  Stop NS and give a dose of Lasix.  EF 25-30% on last echo. Holding beta blocker and lisinopril.  6. Atrial fibrillation: Paroxysmal, maintaining NSR.  Whether or not to restart Xarelto is going to depend in part on psych evaluation.  No CBC this morning, will order since she overdosed on Xarelto.  7. Psych: S/p overdose.  Will need suicide precautions and psych eval.   [name] 10/05/2013 7:35 AM

## 2013-10-05 NOTE — Procedures (Signed)
Extubation Procedure Note  Patient Details:   Name: [name] DOB: [birth year] MRN: [id number]   Airway Documentation:     Evaluation  O2 sats: stable throughout Complications: No apparent complications Patient did tolerate procedure well. Bilateral Breath Sounds: Rhonchi Suctioning: Airway Yes Positive cuff leak Placed on 4l/min [name], good vocalization, mildly hoarse.  Bilateral breath sound equal and diminished.  [name] 10/05/2013, 9:12 AM

## 2013-10-05 NOTE — Progress Notes (Signed)
Lung recruitment due to desaturation

## 2013-10-05 NOTE — Progress Notes (Addendum)
230cc of the patients FentaNYL (SUBLIMAZE) 2,500 mcg in sodium chloride 0.9 % 250 mL (10 mcg/mL) infusion wasted in sink with another RN.  Loni Muse, RN

## 2013-10-06 DIAGNOSIS — F329 Major depressive disorder, single episode, unspecified: Secondary | ICD-10-CM

## 2013-10-06 DIAGNOSIS — F3289 Other specified depressive episodes: Secondary | ICD-10-CM

## 2013-10-06 DIAGNOSIS — T50901A Poisoning by unspecified drugs, medicaments and biological substances, accidental (unintentional), initial encounter: Secondary | ICD-10-CM

## 2013-10-06 DIAGNOSIS — T50902A Poisoning by unspecified drugs, medicaments and biological substances, intentional self-harm, initial encounter: Secondary | ICD-10-CM

## 2013-10-06 DIAGNOSIS — I9589 Other hypotension: Secondary | ICD-10-CM

## 2013-10-06 LAB — BASIC METABOLIC PANEL
ANION GAP: 11 (ref 5–15)
BUN: 12 mg/dL (ref 6–23)
CO2: 25 mEq/L (ref 19–32)
Calcium: 8.1 mg/dL — ABNORMAL LOW (ref 8.4–10.5)
Chloride: 102 mEq/L (ref 96–112)
Creatinine, Ser: 1.01 mg/dL (ref 0.50–1.10)
GFR calc non Af Amer: 60 mL/min — ABNORMAL LOW (ref >90)
GFR, EST AFRICAN AMERICAN: 70 mL/min — AB (ref >90)
Glucose, Bld: 113 mg/dL — ABNORMAL HIGH (ref 70–99)
POTASSIUM: 3.4 meq/L — AB (ref 3.7–5.3)
Sodium: 138 mEq/L (ref 137–147)

## 2013-10-06 LAB — PROTIME-INR
INR: 3.74 — AB (ref 0.00–1.49)
INR: 2.9 — AB (ref 0.00–1.49)
INR: 2.56 — ABNORMAL HIGH (ref 0.00–1.49)
INR: 2.33 — ABNORMAL HIGH (ref 0.00–1.49)
PROTHROMBIN TIME: 27.5 s — AB (ref 11.6–15.2)
PROTHROMBIN TIME: 25.6 s — AB (ref 11.6–15.2)
Prothrombin Time: 37 seconds — ABNORMAL HIGH (ref 11.6–15.2)
Prothrombin Time: 30.3 seconds — ABNORMAL HIGH (ref 11.6–15.2)

## 2013-10-06 LAB — CBC
HEMATOCRIT: 28.2 % — AB (ref 36.0–46.0)
Hemoglobin: 9.4 g/dL — ABNORMAL LOW (ref 12.0–15.0)
MCH: 29.9 pg (ref 26.0–34.0)
MCHC: 33.3 g/dL (ref 30.0–36.0)
MCV: 89.8 fL (ref 78.0–100.0)
Platelets: 223 10*3/uL (ref 150–400)
RBC: 3.14 MIL/uL — ABNORMAL LOW (ref 3.87–5.11)
RDW: 13.6 % (ref 11.5–15.5)
WBC: 8.7 10*3/uL (ref 4.0–10.5)

## 2013-10-06 LAB — GLUCOSE, CAPILLARY
Glucose-Capillary: 117 mg/dL — ABNORMAL HIGH (ref 70–99)
Glucose-Capillary: 81 mg/dL (ref 70–99)

## 2013-10-06 LAB — PHOSPHORUS: PHOSPHORUS: 2.3 mg/dL (ref 2.3–4.6)

## 2013-10-06 LAB — MAGNESIUM: Magnesium: 1.8 mg/dL (ref 1.5–2.5)

## 2013-10-06 MED ORDER — POTASSIUM CHLORIDE CRYS ER 20 MEQ PO TBCR
40.0000 meq | EXTENDED_RELEASE_TABLET | Freq: Three times a day (TID) | ORAL | Status: AC
  Administered 2013-10-06 (×2): 40 meq via ORAL
  Filled 2013-10-06 (×2): qty 2

## 2013-10-06 MED ORDER — BOOST / RESOURCE BREEZE PO LIQD
1.0000 | Freq: Three times a day (TID) | ORAL | Status: DC
  Administered 2013-10-06: 17:00:00 via ORAL
  Administered 2013-10-06 – 2013-10-12 (×7): 1 via ORAL

## 2013-10-06 NOTE — Progress Notes (Signed)
Spoke with psychiatrist and he did not feel patient was ready for suicide sitter to be discontinued.   Etta Quill

## 2013-10-06 NOTE — Progress Notes (Signed)
NUTRITION FOLLOW-UP  INTERVENTION:  Resource Breeze po TID, each supplement provides 250 kcal and 9 grams of protein  NUTRITION DIAGNOSIS: Inadequate oral intake related to decreased appetite/stress as evidenced by per pt report; ongoing.   Goal: Pt to meet >/= 90% of their estimated nutrition needs; not met.    Monitor:  Diet advancement, PO intake, weight trends, labs  ASSESSMENT: 58 year old with hx of ETOH abuse admitted after suicide attempt with coreg and xarelto overdose.  Pt extubated 9/15, started on PO diet.  Per pt her appetite was decreased PTA due to stress. Pt drank some of her clear liquids this am, plans for clears at lunch, and may try solid food this evening. Discussed menu options. Pt willing to drink Resource Breeze (orange), states she has this before at a SNF and liked it.   Potassium low on replacement.   Height: Ht Readings from Last 1 Encounters:  10/03/13 $RemoveB'5\' 3"'[id number]$  (1.6 m)    Weight: Wt Readings from Last 1 Encounters:  10/06/13 134 lb 0.6 oz (60.8 kg)    BMI:  Body mass index is 23.75 kg/(m^2).  Estimated Nutritional Needs: Kcal: 1500-1700 Protein: 70-85 grams Fluid: >1.5 L/day  Skin: right elbow  Diet Order: Clear Liquid   Intake/Output Summary (Last 24 hours) at 10/06/13 1159 Last data filed at 10/06/13 0820  Gross per 24 hour  Intake 743.26 ml  Output   2850 ml  Net -2106.74 ml    Last BM: PTA   Labs:   Recent Labs Lab 10/03/13 2058 10/04/13 0020 10/04/13 1300 10/05/13 0310 10/06/13 0500  NA 142 144 137 136* 138  K 3.2* 3.7 4.1 3.8 3.4*  CL 107 108 104 105 102  CO2  --  18* 17* 18* 25  BUN $Re'10 12 16 15 12  'QYG$ CREATININE 1.60* 1.01 0.97 0.93 1.01  CALCIUM  --  7.6* 7.7* 7.6* 8.1*  MG  --  1.5  --   --  1.8  PHOS  --  2.4  --   --  2.3  GLUCOSE 174* 145* 103* 134* 113*    CBG (last 3)   Recent Labs  10/05/13 2015 10/05/13 2313 10/06/13 0846  GLUCAP 86 98 117*    Scheduled Meds: . atorvastatin  80 mg Oral q1800   . levothyroxine  12.5 mcg Oral QAC breakfast  . multivitamin with minerals  1 tablet Oral Daily  . pantoprazole (PROTONIX) IV  40 mg Intravenous QHS  . potassium chloride  40 mEq Oral TID    Continuous Infusions: . DOPamine 2 mcg/kg/min (10/06/13 1044)  . norepinephrine (LEVOPHED) Adult infusion Stopped (10/04/13 0316)     [place], [place], CNSC [phone] Pager [phone] After Hours Pager

## 2013-10-06 NOTE — Progress Notes (Signed)
Patient ID: [name], female   DOB: 1955-07-29, 58 y.o.   MRN: [id number]   SUBJECTIVE: Extubated yesterday.  Awake/alert.  Denies dyspnea.  Diuresed well yesterday.  Still on dopamine at 6.    Scheduled Meds: . antiseptic oral rinse  7 mL Mouth Rinse QID  . atorvastatin  80 mg Oral q1800  . chlorhexidine  15 mL Mouth Rinse BID  . levothyroxine  12.5 mcg Oral QAC breakfast  . multivitamin with minerals  1 tablet Oral Daily  . pantoprazole (PROTONIX) IV  40 mg Intravenous QHS  . potassium chloride  40 mEq Oral TID   Continuous Infusions: . DOPamine 5.396 mcg/kg/min (10/06/13 0700)  . norepinephrine (LEVOPHED) Adult infusion Stopped (10/04/13 0316)   PRN Meds:.sodium chloride, albuterol, docusate sodium, fentaNYL, ipratropium    Filed Vitals:   10/06/13 0400 10/06/13 0500 10/06/13 0600 10/06/13 0700  BP: 78/31 126/43 128/39 112/42  Pulse: 69 78 73 70  Temp: 100.6 F (38.1 C) 100.8 F (38.2 C) 100.8 F (38.2 C)   TempSrc:      Resp: 20 19 21 19   Height:      Weight:  134 lb 0.6 oz (60.8 kg)    SpO2: 99% 98% 96% 98%    Intake/Output Summary (Last 24 hours) at 10/06/13 0724 Last data filed at 10/06/13 0700  Gross per 24 hour  Intake 326.07 ml  Output   3950 ml  Net -3623.93 ml    LABS: Basic Metabolic Panel:  Recent Labs  10/04/13 0020  10/05/13 0310 10/06/13 0500  NA 144  < > 136* 138  K 3.7  < > 3.8 3.4*  CL 108  < > 105 102  CO2 18*  < > 18* 25  GLUCOSE 145*  < > 134* 113*  BUN 12  < > 15 12  CREATININE 1.01  < > 0.93 1.01  CALCIUM 7.6*  < > 7.6* 8.1*  MG 1.5  --   --  1.8  PHOS 2.4  --   --  2.3  < > = values in this interval not displayed. Liver Function Tests:  Recent Labs  10/03/13 2045 10/04/13 1300  AST 20 24  ALT 14 17  ALKPHOS 95 110  BILITOT <0.2* 0.4  PROT 5.8* 6.5  ALBUMIN 2.8* 3.2*   No results found for this basename: LIPASE, AMYLASE,  in the last 72 hours CBC:  Recent Labs  10/03/13 2045  10/04/13 1300 10/05/13 0800  10/06/13 0500  WBC 3.9*  < > 14.1* 14.0* 8.7  NEUTROABS 2.1  --  12.4*  --   --   HGB 8.5*  < > 9.7* 9.6* 9.4*  HCT 26.0*  < > 29.3* 28.8* 28.2*  MCV 92.2  < > 88.8 90.3 89.8  PLT 219  < > 258 261 223  < > = values in this interval not displayed. Cardiac Enzymes: No results found for this basename: CKTOTAL, CKMB, CKMBINDEX, TROPONINI,  in the last 72 hours BNP: No components found with this basename: POCBNP,  D-Dimer: No results found for this basename: DDIMER,  in the last 72 hours Hemoglobin A1C: No results found for this basename: HGBA1C,  in the last 72 hours Fasting Lipid Panel: No results found for this basename: CHOL, HDL, LDLCALC, TRIG, CHOLHDL, LDLDIRECT,  in the last 72 hours Thyroid Function Tests:  Recent Labs  10/04/13 0020  TSH 10.630*   Anemia Panel: No results found for this basename: VITAMINB12, FOLATE, FERRITIN, TIBC, IRON, RETICCTPCT,  in  the last 72 hours  RADIOLOGY: Dg Chest Port 1 View  10/04/2013   CLINICAL DATA:  Congestive heart failure. Assess for endotracheal tube placement.  EXAM: PORTABLE CHEST - 1 VIEW  COMPARISON:  October 03, 2013.  FINDINGS: An endotracheal tube is identified with distal tip 2.2 cm from carina. Retraction by 2 cm is recommended. Nasogastric tube is unchanged. The lungs are clear. There is no focal infiltrate, pulmonary edema, or pleural effusion. The heart size is normal. The bony structures are stable.  IMPRESSION: Endotracheal tube is identified with distal tip 2.2 cm from carina. Retraction by 2 cm recommended. There is no pneumothorax.   Electronically Signed   By: Abelardo Diesel M.D.   On: 10/04/2013 13:34   Dg Chest Port 1 View  10/03/2013   CLINICAL DATA:  Drug overdose.  Suicidal ideation.  EXAM: PORTABLE CHEST - 1 VIEW  COMPARISON:  Chest radiograph performed 09/02/2013  FINDINGS: The patient's endotracheal tube is seen ending 5 cm above the carina. An enteric tube is noted extending below the diaphragm.  The lungs are  well-aerated and clear, though the left lung is difficult to fully assess due to overlying pacing pads. There is no evidence of focal opacification, pleural effusion or pneumothorax.  The cardiomediastinal silhouette is borderline normal in size. No acute osseous abnormalities are seen. External pacing pads are noted.  IMPRESSION: 1. Endotracheal tube seen ending 5 cm above the carina. 2. No acute cardiopulmonary process seen.   Electronically Signed   By: Garald Balding M.D.   On: 10/03/2013 21:37   Mr Card Morphology Wo/w Cm  09/07/2013   CLINICAL DATA:  CAD, evaluate for viability  EXAM: CARDIAC MRI  TECHNIQUE: The patient was scanned on a 1.5 Tesla GE magnet. A dedicated cardiac coil was used. Functional imaging was done using Fiesta sequences. 2,3, and 4 chamber views were done to assess for RWMA's. Modified Simpson's rule using a short axis stack was used to calculate an ejection fraction on a dedicated work Conservation officer, nature. The patient received 20 cc of Multihance. After 10 minutes inversion recovery sequences were used to assess for infiltration and scar tissue.  CONTRAST:  20 cc  of Multihance  FINDINGS: 1. Mildly dilated left ventricular size with normal thickness and severely impaired systolic function (LVEF = 28%).  There are regional wall motion abnormalities with severe hypokinesis of the entire anterior and inferior wall, basal and mid inferoseptal walls and there is paradoxical septal motion.  The measurements are as follows:  LVEDD:  56 mm  LVESD:  51 mm  LVEDV:  137 ml  LVESV:  99 ml  SV:  38 ml  CO:  2.2 L/minute  Myocardial mass:  91 g  2. Normal right ventricular size, thickness and systolic function (RVEF = 53%).  RVEDV:  62 ml  RVESV:  29 ml  3. Mild mitral regurgitation.  Normal bi-atrial size.  Normal size aortic root, ascending aorta and pulmonary artery.  4.  There is late gadolinium enhancement in these segments:  Basal anterior:  25-50% (good prognosis)  Mid anterior:   50-75% (fair prognosis)  Apical anterior:  50-75% (fair prognosis)  Basal inferior:  75-100% (poor prognosis)  Mid inferior: 75-100% (poor prognosis)  Apical inferior: 75-100% (poor prognosis)  Basal inferoseptal: 75-100% (poor prognosis)  IMPRESSION: 1. Mildly dilated left ventricular size with normal thickness and severely impaired systolic function (LVEF = 28%).  There are regional wall motion abnormalities with severe hypokinesis of the entire  anterior and inferior wall, basal and mid inferoseptal walls and there is paradoxical septal motion.  2. Normal right ventricular size, thickness and systolic function (RVEF = 53%).  3. Conclusively, these findings are consistent with predominantly non-viable myocardium in the LAD territory.  Ena Dawley   Electronically Signed   By: Ena Dawley   On: 09/07/2013 10:26    PHYSICAL EXAM General: NAD Neck: JVP 7 cm, no thyromegaly or thyroid nodule.  Lungs: Coarse breath sounds.  CV: Nondisplaced PMI.  Heart regular S1/S2, no S3/S4, no murmur.  No peripheral edema.  No carotid bruit.  Normal pedal pulses.  Abdomen: Soft, nontender, no hepatosplenomegaly, no distention.  Neurologic: Sleeping but has been alert/responsive.   Extremities: No clubbing or cyanosis.   TELEMETRY: Reviewed telemetry pt in NSR  ASSESSMENT AND PLAN: 58 yo with history of CAD, ischemic CMP, and ETOH abuse was admitted after she overdosed on Coreg and Xarelto.  1. Pulmonary: Extubated yesterday. 2. Hypotension: Still on dopamine at 6 after beta blocker overdose.  Will titrate down dopamine today for SBP >/= 90.    3. Bradycardia: Remains on dopamine with stable HR.  Titrate down today.  4. CAD: No chest pain.  Non-rescularizable disease.  On atorvastatin.  5. CHF: Acute on chronic systolic CHF.  Volume looks ok this morning after IV Lasix yesterday.  EF 25-30% on last echo. Holding beta blocker and lisinopril.  6. Atrial fibrillation: Paroxysmal, maintaining NSR.  Whether or  not to restart Xarelto is going to depend in part on psych evaluation.  INR remains elevated after Xarelto overdose.  7. Psych: S/p overdose.  Will need suicide precautions and psych eval.   [name] 10/06/2013 7:24 AM

## 2013-10-06 NOTE — Consult Note (Signed)
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 no evidence of delirium currently  Thought Content:  no hallucinations, no delusions  Suicidal Thoughts:  No- at this time not suicidal , but remains ruminative  About being able to deal with stressors at home  Homicidal Thoughts:  No  Memory:  recent and remote grossly intact  Judgement:  Fair  Insight:  Fair  Psychomotor  Activity:  Decreased  Concentration:  Good  Recall:  Good  Fund of Knowledge:Good  Language: Good  Akathisia:  Negative  Handed:  Right  AIMS (if indicated):     Assets:  Communication Skills Desire for Improvement Resilience  Sleep:      Musculoskeletal: Strength & Muscle Tone: gait and muscle tone not examined- patient does not report  abnormalities  Gait & Station: NA Patient leans: N/A  Treatment Plan Summary: At this time there is no indication for involuntary commitment or psychiatric admission. _Patient would benefit from antidepressant medication. We discussed this with patient , and she agreed. Would consider an SSRI, such as Zoloft , to start at 25 mgrs a day, which is  Somewhat lower than usual starting dose, due to her medical compromise/status. Most common side effects are GI.  _Patient will, as per her description, likely decompensate if she returns to home stressors she describes. There is also the concern of maladaptive coping via smoking cigarettes and drinking. Due to this , would recommend SW consult to determine if there are other living arrangements that can be explored.   COBOS, Kinney 10/06/2013 4:06 PM

## 2013-10-06 NOTE — Progress Notes (Signed)
Left radial arterial line dc per protocol. VSS,site unremarkable. Pt. Tolerated well.   Etta Quill

## 2013-10-06 NOTE — ED Provider Notes (Signed)
I saw and evaluated the patient, reviewed the resident's note and I agree with the findings and plan.   .Face to face Exam:  General:  Obtunded HEENT:  Atraumatic Resp:  Moderate respiratory distress Abd:  Nondistended Neuro: Unable to complete Lymph: No adenopathy   EKG was discussed and reviewed with resident  I was present during intubation and available during the entire procedure.  CRITICAL CARE Performed by: Dot Lanes Total critical care time: 45 min Critical care time was exclusive of separately billable procedures and treating other patients. Critical care was necessary to treat or prevent imminent or life-threatening deterioration. Critical care was time spent personally by me on the following activities: development of treatment plan with patient and/or surrogate as well as nursing, discussions with consultants, evaluation of patient's response to treatment, examination of patient, obtaining history from patient or surrogate, ordering and performing treatments and interventions, ordering and review of laboratory studies, ordering and review of radiographic studies, pulse oximetry and re-evaluation of patient's condition.   Dot Lanes, MD 10/06/13 [phone]

## 2013-10-06 NOTE — Progress Notes (Signed)
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   ?BB OD.  SIGNIFICANT EVENTS / STUDIES:  10/03/13: INR: 5.87, ETOH: 107  LINES / TUBES: 10/03/13: R IO in place out.  CULTURES: None  ANTIBIOTICS: None  SUBJECTIVE:  Awake and interactive, following commands, remains on pressors overnight.  VITAL SIGNS: Temp:  [98.6 F (37 C)-100.8 F (38.2 C)] 100.8 F (38.2 C) (09/16 0600) Pulse Rate:  [64-83] 70 (09/16 0700) Resp:  [14-24] 19 (09/16 0700) BP: (78-128)/(31-52) 112/42 mmHg (09/16 0700) SpO2:  [96 %-100 %] 98 % (09/16 0700) FiO2 (%):  [40 %] 40 % (09/15 0810) Weight:  [60.8 kg (134 lb 0.6 oz)] 60.8 kg (134 lb 0.6 oz) (09/16 0500)  HEMODYNAMICS:   VENTILATOR SETTINGS: Vent Mode:  [-] PSV;CPAP FiO2 (%):  [40 %] 40 % PEEP:  [5 cmH20] 5 cmH20 Pressure Support:  [5 cmH20] 5 cmH20  INTAKE / OUTPUT: Intake/Output     09/15 0701 - 09/16 0700 09/16 0701 - 09/17 0700   I.V. (mL/kg) 206.1 (3.4)    NG/GT 70    IV Piggyback 50    Total Intake(mL/kg) 326.1 (5.4)    Urine (mL/kg/hr) 3950 (2.7)    Total Output 3950     Net -3623.9           PHYSICAL EXAMINATION:  Gen: Off vent, remains on pressors, chronically ill appearing female HEENT: NCAT, PERRL, ETT in place PULM: Diminished R base, crackles CV: RRR, Nl S1/S2 no JVD AB: BS+, soft, nontender Ext: warm, no ankle edema, no clubbing, no cyanosis Derm: no rash or skin breakdown Neuro: alert and interactive, following all commands.  LABS:  CBC  Recent Labs Lab 10/04/13 1300 10/05/13 0800 10/06/13 0500  WBC 14.1* 14.0* 8.7  HGB 9.7* 9.6* 9.4*  HCT 29.3* 28.8* 28.2*  PLT 258 261  223   Coag's  Recent Labs Lab 10/05/13 1348 10/05/13 1948 10/06/13 0230  INR 2.63* 4.27* 3.74*   BMET  Recent Labs Lab 10/04/13 1300 10/05/13 0310 10/06/13 0500  NA 137 136* 138  K 4.1 3.8 3.4*  CL 104 105 102  CO2 17* 18* 25  BUN 16 15 12   CREATININE 0.97 0.93 1.01  GLUCOSE 103* 134* 113*   Electrolytes  Recent Labs Lab 10/04/13 0020 10/04/13 1300 10/05/13 0310 10/06/13 0500  CALCIUM 7.6* 7.7* 7.6* 8.1*  MG 1.5  --   --  1.8  PHOS 2.4  --   --  2.3   Sepsis Markers  Recent Labs Lab 10/03/13 2035  LATICACIDVEN 4.71*   ABG  Recent Labs Lab 10/03/13 2117 10/04/13 0330  PHART 7.297* 7.334*  PCO2ART 40.0 36.5  PO2ART 386.0* 154.0*   Liver Enzymes  Recent Labs Lab 10/03/13 2045 10/04/13 1300  AST 20 24  ALT 14 17  ALKPHOS 95 110  BILITOT <0.2* 0.4  ALBUMIN 2.8* 3.2*   Cardiac Enzymes No results found for this basename: TROPONINI, PROBNP,  in the last 168 hours Glucose  Recent Labs Lab 10/05/13 0318 10/05/13 0901 10/05/13 1115 10/05/13 1812 10/05/13 2015 10/05/13 2313  GLUCAP 127* 161* 148* 131* 86 98    Imaging Dg Chest  Port 1 View  10/05/2013   CLINICAL DATA:  Bradycardia and hypotension.  EXAM: PORTABLE CHEST - 1 VIEW  COMPARISON:  10/04/2013  FINDINGS: Endotracheal tube remains with the tip approximately 2 cm above the carina. External pacing pad remains. Lungs show minimal atelectasis at the right base. There is no evidence of pulmonary edema, consolidation, pneumothorax, nodule or pleural fluid. The heart size is normal.  IMPRESSION: Minimal right basilar atelectasis.   Electronically Signed   By: Aletta Edouard M.D.   On: 10/05/2013 08:13   Dg Chest Port 1 View  10/04/2013   CLINICAL DATA:  Congestive heart failure. Assess for endotracheal tube placement.  EXAM: PORTABLE CHEST - 1 VIEW  COMPARISON:  October 03, 2013.  FINDINGS: An endotracheal tube is identified with distal tip 2.2 cm from carina. Retraction by 2 cm is  recommended. Nasogastric tube is unchanged. The lungs are clear. There is no focal infiltrate, pulmonary edema, or pleural effusion. The heart size is normal. The bony structures are stable.  IMPRESSION: Endotracheal tube is identified with distal tip 2.2 cm from carina. Retraction by 2 cm recommended. There is no pneumothorax.   Electronically Signed   By: Abelardo Diesel M.D.   On: 10/04/2013 13:34    EKG: Sinus brady with prolonged qt CXR: No acute airspace disease, overall normal with exception of ETT in place  ASSESSMENT / PLAN:  Principal Problem:   Overdose of beta-adrenergic antagonist drug Active Problems:   Hypotension   Suicide attempt by beta blocker overdose  TOXICOLOGY A: Overdose, unknown medications but suspect coreg and xarelto APAP, dig levels negative P: Continue supportive care. Dopamine for likely coreg OD, levophed off but dopamine dose increased.  PULMONARY A: Acute hypercarbic respiratory failure due to drug overdose  P:  Titrate O2 for sats Once off pressors then will remove TLC and mobilize. IS per rt protocol.  CARDIOVASCULAR A: Bradycardia due to carvedilol OD.   Known ischemic cardiomyopathy > EF 25%, not in exacerbation Shock> due to coreg OD  9/13 R femoral CVL >  P:   D/C glucagon KVO fluids Maintain dopamine for MAP > 65 Hold home medications Will remove TLC once off dopamine if able Cardiology consult appreciated.  RENAL A: No acute issues P:   Monitor UOP BMET daily. Replace electrolytes as indicated.  GASTROINTESTINAL A: EtOH abuse > cirrhosis? P:   F/U LFTs  Ammonia level of 21, stable D/C TF Continue heart healthy diet  HEMATOLOGIC A: Elevated INR, suspect 2/2 Xarelto OD  P:   Change coag to daily Low threshold for head CT if clinical exam changes but now stable, no concern for bleeding.  INFECTIOUS A: No acute issus P:   Monitor for signs of developing infection  ENDOCRINE A: Hypothyroidism  > notes  show currently undertreated  P:   Continue home synthroid  NEUROLOGIC A: AMS 2/2 drug OD Suicide attempt P:   D/C fentanyl Suicide precautions post extubation Will call psych for evaluation for suicide attempt  TODAY'S SUMMARY: 58 y/o female with etoh abuse, CHF who attempted suicide with coreg and Xarelto overdose.  Much more improved mental status but remains on dopamine likely from the high dose coreg the patient took, will continue titrate of the dopamine as able, call psych, will hold in the ICU as long as on pressors.  I have personally obtained a history, examined the patient, evaluated laboratory and imaging results, formulated the assessment and plan and placed orders.  CRITICAL CARE: The patient is critically  ill with multiple organ systems failure and requires high complexity decision making for assessment and support, frequent evaluation and titration of therapies, application of advanced monitoring technologies and extensive interpretation of multiple databases. Critical Care Time devoted to patient care services described in this note is  35 minutes.   Rush Farmer, M.D. [facility] Pulmonary/Critical Care Medicine. Pager: [phone]. After hours pager: [phone].  10/06/2013, 7:16 AM

## 2013-10-07 DIAGNOSIS — T50904A Poisoning by unspecified drugs, medicaments and biological substances, undetermined, initial encounter: Secondary | ICD-10-CM

## 2013-10-07 DIAGNOSIS — X838XXS Intentional self-harm by other specified means, sequela: Secondary | ICD-10-CM

## 2013-10-07 DIAGNOSIS — I9589 Other hypotension: Secondary | ICD-10-CM

## 2013-10-07 DIAGNOSIS — T50901S Poisoning by unspecified drugs, medicaments and biological substances, accidental (unintentional), sequela: Secondary | ICD-10-CM

## 2013-10-07 LAB — PHOSPHORUS: PHOSPHORUS: 2.4 mg/dL (ref 2.3–4.6)

## 2013-10-07 LAB — BASIC METABOLIC PANEL
Anion gap: 13 (ref 5–15)
BUN: 10 mg/dL (ref 6–23)
CALCIUM: 8.5 mg/dL (ref 8.4–10.5)
CO2: 23 meq/L (ref 19–32)
Chloride: 103 mEq/L (ref 96–112)
Creatinine, Ser: 0.99 mg/dL (ref 0.50–1.10)
GFR calc Af Amer: 71 mL/min — ABNORMAL LOW (ref >90)
GFR calc non Af Amer: 62 mL/min — ABNORMAL LOW (ref >90)
GLUCOSE: 93 mg/dL (ref 70–99)
Potassium: 4.5 mEq/L (ref 3.7–5.3)
SODIUM: 139 meq/L (ref 137–147)

## 2013-10-07 LAB — CBC
HEMATOCRIT: 27.1 % — AB (ref 36.0–46.0)
HEMOGLOBIN: 8.7 g/dL — AB (ref 12.0–15.0)
MCH: 29.2 pg (ref 26.0–34.0)
MCHC: 32.1 g/dL (ref 30.0–36.0)
MCV: 90.9 fL (ref 78.0–100.0)
Platelets: 251 10*3/uL (ref 150–400)
RBC: 2.98 MIL/uL — AB (ref 3.87–5.11)
RDW: 13.5 % (ref 11.5–15.5)
WBC: 6.7 10*3/uL (ref 4.0–10.5)

## 2013-10-07 LAB — PROTIME-INR
INR: 1.96 — ABNORMAL HIGH (ref 0.00–1.49)
PROTHROMBIN TIME: 22.3 s — AB (ref 11.6–15.2)

## 2013-10-07 LAB — MAGNESIUM: Magnesium: 2.1 mg/dL (ref 1.5–2.5)

## 2013-10-07 MED ORDER — FUROSEMIDE 20 MG PO TABS
20.0000 mg | ORAL_TABLET | Freq: Every day | ORAL | Status: DC
Start: 2013-10-07 — End: 2013-10-07
  Filled 2013-10-07: qty 1

## 2013-10-07 MED ORDER — PANTOPRAZOLE SODIUM 40 MG PO TBEC
40.0000 mg | DELAYED_RELEASE_TABLET | Freq: Every day | ORAL | Status: DC
  Administered 2013-10-07 – 2013-10-13 (×7): 40 mg via ORAL
  Filled 2013-10-07 (×7): qty 1

## 2013-10-07 MED ORDER — FUROSEMIDE 20 MG PO TABS
20.0000 mg | ORAL_TABLET | Freq: Every day | ORAL | Status: DC
Start: 2013-10-08 — End: 2013-10-13
  Administered 2013-10-08 – 2013-10-12 (×5): 20 mg via ORAL
  Filled 2013-10-07 (×6): qty 1

## 2013-10-07 MED ORDER — ONDANSETRON HCL 4 MG/2ML IJ SOLN
4.0000 mg | Freq: Four times a day (QID) | INTRAMUSCULAR | Status: DC | PRN
  Administered 2013-10-07: 4 mg via INTRAVENOUS
  Filled 2013-10-07: qty 2

## 2013-10-07 NOTE — Progress Notes (Signed)
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   ?BB OD.  SIGNIFICANT EVENTS / STUDIES:  10/03/13: INR: 5.87, ETOH: 107  LINES / TUBES: PIV  CULTURES: None  ANTIBIOTICS: None  SUBJECTIVE:  Awake and interactive, following commands, now off pressors.  VITAL SIGNS: Temp:  [98 F (36.7 C)-98.6 F (37 C)] 98.3 F (36.8 C) (09/17 0832) Pulse Rate:  [56-128] 69 (09/17 0900) Resp:  [14-25] 15 (09/17 0900) BP: (70-142)/(33-105) 92/47 mmHg (09/17 0900) SpO2:  [87 %-100 %] 97 % (09/17 0900) Weight:  [57.2 kg (126 lb 1.7 oz)] 57.2 kg (126 lb 1.7 oz) (09/17 0500)  HEMODYNAMICS:   VENTILATOR SETTINGS:    INTAKE / OUTPUT: Intake/Output     09/16 0701 - 09/17 0700 09/17 0701 - 09/18 0700   P.O. 1940    I.V. (mL/kg) 308.8 (5.4) 20 (0.3)   NG/GT     IV Piggyback     Total Intake(mL/kg) 2248.8 (39.3) 20 (0.3)   Urine (mL/kg/hr) 1000 (0.7)    Total Output 1000     Net +1248.8 +20        Urine Occurrence 2 x 50 x   Stool Occurrence 1 x     PHYSICAL EXAMINATION:  Gen: Off vent and pressors this AM, chronically ill appearing female HEENT: NCAT, PERRL, ETT in place PULM: Diminished R base, crackles CV: RRR, Nl S1/S2 no JVD AB: BS+, soft, nontender Ext: warm, no ankle edema, no clubbing, no cyanosis Derm: no rash or skin breakdown Neuro: alert and interactive, following all commands.  LABS:  CBC  Recent Labs Lab 10/05/13 0800 10/06/13 0500 10/07/13 0430  WBC 14.0* 8.7 6.7  HGB 9.6* 9.4* 8.7*  HCT 28.8* 28.2* 27.1*  PLT 261 223 251   Coag's  Recent Labs Lab 10/06/13 1320 10/06/13 1948 10/07/13 0148  INR 2.56* 2.33* 1.96*    BMET  Recent Labs Lab 10/05/13 0310 10/06/13 0500 10/07/13 0430  NA 136* 138 139  K 3.8 3.4* 4.5  CL 105 102 103  CO2 18* 25 23  BUN 15 12 10   CREATININE 0.93 1.01 0.99  GLUCOSE 134* 113* 93   Electrolytes  Recent Labs Lab 10/04/13 0020  10/05/13 0310 10/06/13 0500 10/07/13 0430  CALCIUM 7.6*  < > 7.6* 8.1* 8.5  MG 1.5  --   --  1.8 2.1  PHOS 2.4  --   --  2.3 2.4  < > = values in this interval not displayed. Sepsis Markers  Recent Labs Lab 10/03/13 2035  LATICACIDVEN 4.71*   ABG  Recent Labs Lab 10/03/13 2117 10/04/13 0330  PHART 7.297* 7.334*  PCO2ART 40.0 36.5  PO2ART 386.0* 154.0*   Liver Enzymes  Recent Labs Lab 10/03/13 2045 10/04/13 1300  AST 20 24  ALT 14 17  ALKPHOS 95 110  BILITOT <0.2* 0.4  ALBUMIN 2.8* 3.2*   Cardiac Enzymes No results found for this basename: TROPONINI, PROBNP,  in the last 168 hours Glucose  Recent Labs Lab 10/05/13 1115 10/05/13 1812 10/05/13 2015 10/05/13 2313 10/06/13 0846 10/06/13 1608  GLUCAP 148* 131* 86 98 117* 81    Imaging No results found.  EKG: Sinus brady with prolonged qt CXR: No acute airspace disease, overall normal with exception of ETT in place  ASSESSMENT / PLAN:  Principal Problem:   Overdose of beta-adrenergic antagonist drug Active Problems:   Hypotension   Suicide attempt by beta blocker overdose  TOXICOLOGY A: Overdose, unknown medications but suspect coreg and xarelto APAP, dig levels negative P: Continue supportive care. Now off pressors  PULMONARY A: Acute hypercarbic respiratory failure due to drug overdose  P:  Titrate O2 for sats to off. D/C TLC. Mobilize. IS per rt protocol.  CARDIOVASCULAR A: Bradycardia due to carvedilol OD.   Known ischemic cardiomyopathy > EF 25%, not in exacerbation Shock> due to coreg OD  9/13 R femoral CVL >  P:   D/C glucagon KVO fluids D/C dopamine Hold home medications Will remove TLC Cardiology consult  appreciated, now on PO lasix.  RENAL A: No acute issues P:   Monitor UOP BMET daily. Replace electrolytes as indicated. Lasix 20 mg PO daily per cards.  GASTROINTESTINAL A: EtOH abuse > cirrhosis? P:   F/U LFTs  Ammonia level of 21, stable Continue heart healthy diet  HEMATOLOGIC A: Elevated INR, suspect 2/2 Xarelto OD  P:   Coags daily until normal, Xaralto overdose.  INFECTIOUS A: No acute issus P:   Monitor for signs of developing infection  ENDOCRINE A: Hypothyroidism  > notes show currently undertreated  P:   Continue home synthroid  NEUROLOGIC A: AMS 2/2 drug OD Suicide attempt P:   D/C fentanyl Suicide precautions post extubation Psych input appreciated, continue sitter.  TODAY'S SUMMARY: 58 y/o female with etoh abuse, CHF who attempted suicide with coreg and Xarelto overdose.  Transfer to tele today, mobilize, OOB to chair and PT evaluation, titrate O2 to off, transfer care to [facility], PCCM will sign off, please call back if needed.  I have personally obtained a history, examined the patient, evaluated laboratory and imaging results, formulated the assessment and plan and placed orders.  Rush Farmer, M.D. [facility] Pulmonary/Critical Care Medicine. Pager: [phone]. After hours pager: [phone].  10/07/2013, 9:48 AM

## 2013-10-07 NOTE — Progress Notes (Signed)
CARE MANAGEMENT NOTE 10/07/2013  Patient:  [name],[name]   Account Number:  [id number]  Date Initiated:  10/04/2013  Documentation initiated by:  DAVIS,RHONDA  Subjective/Objective Assessment:   overdose of prescription beta blockers/bradycardiac,ams and airway protection required.     Action/Plan:   tbd depending of course of stay and psych eval.   Anticipated DC Date:  10/08/2013   Anticipated DC Plan:  SKILLED NURSING FACILITY  In-house referral  Clinical Social Worker      DC Planning Services  CM consult      [facility] Choice  NA   Choice offered to / List presented to:  NA   DME arranged  NA      DME agency  NA     [place] arranged  NA      [place] agency  NA   Status of service:  In process, will continue to follow Medicare Important Message given?  NA - LOS <3 / Initial given by admissions (If response is "NO", the following Medicare IM given date fields will be blank) Date Medicare IM given:   Medicare IM given by:   Date Additional Medicare IM given:   Additional Medicare IM given by:    Discharge Disposition:    Per UR Regulation:  Reviewed for med. necessity/level of care/duration of stay  If discussed at [place] of Stay Meetings, dates discussed:    Comments:  [id number]/Rhonda Davis,RN,BSn,CCM: Patient with intention O.D. ofr beta blockers and xarelto/psych-does not need inpatient admission-needs outpt tx and antidepressants/cardio would like for snf placement to regulate medications/[place] also like psych input as to the safety of restating the beta blockers/off pressors on [id number]/extubated [id number]/weaning o2 needs [id number]/orders to transfer tele bed in place.  [phone] Rhonda Davis,RN,BSN,CCM

## 2013-10-07 NOTE — Progress Notes (Signed)
Patient ID: [name], female   DOB: [birth year], 58 y.o.   MRN: [id number]   SUBJECTIVE: Dopamine stopped this morning.  She is doing well, no dyspnea/chest pain.  Still on oxygen. She has seen psychiatry, not candidate for involuntary psych admission apparently.   Scheduled Meds: . atorvastatin  80 mg Oral q1800  . feeding supplement (RESOURCE BREEZE)  1 Container Oral TID BM  . levothyroxine  12.5 mcg Oral QAC breakfast  . multivitamin with minerals  1 tablet Oral Daily  . pantoprazole (PROTONIX) IV  40 mg Intravenous QHS   Continuous Infusions: . DOPamine Stopped (10/07/13 0500)  . norepinephrine (LEVOPHED) Adult infusion Stopped (10/04/13 0316)   PRN Meds:.sodium chloride, albuterol, docusate sodium, fentaNYL, ipratropium    Filed Vitals:   10/07/13 0400 10/07/13 0500 10/07/13 0600 10/07/13 0700  BP: 93/69 126/63 113/58 114/55  Pulse: 58 78 62 63  Temp: 98 F (36.7 C)     TempSrc: Oral     Resp: 20 25 20 22   Height:      Weight:  126 lb 1.7 oz (57.2 kg)    SpO2: 98% 95% 98% 100%    Intake/Output Summary (Last 24 hours) at 10/07/13 0726 Last data filed at 10/07/13 0700  Gross per 24 hour  Intake 2248.83 ml  Output   1000 ml  Net 1248.83 ml    LABS: Basic Metabolic Panel:  Recent Labs  10/06/13 0500 10/07/13 0430  NA 138 139  K 3.4* 4.5  CL 102 103  CO2 25 23  GLUCOSE 113* 93  BUN 12 10  CREATININE 1.01 0.99  CALCIUM 8.1* 8.5  MG 1.8 2.1  PHOS 2.3 2.4   Liver Function Tests:  Recent Labs  10/04/13 1300  AST 24  ALT 17  ALKPHOS 110  BILITOT 0.4  PROT 6.5  ALBUMIN 3.2*   No results found for this basename: LIPASE, AMYLASE,  in the last 72 hours CBC:  Recent Labs  10/04/13 1300  10/06/13 0500 10/07/13 0430  WBC 14.1*  < > 8.7 6.7  NEUTROABS 12.4*  --   --   --   HGB 9.7*  < > 9.4* 8.7*  HCT 29.3*  < > 28.2* 27.1*  MCV 88.8  < > 89.8 90.9  PLT 258  < > 223 251  < > = values in this interval not displayed. Cardiac Enzymes: No  results found for this basename: CKTOTAL, CKMB, CKMBINDEX, TROPONINI,  in the last 72 hours BNP: No components found with this basename: POCBNP,  D-Dimer: No results found for this basename: DDIMER,  in the last 72 hours Hemoglobin A1C: No results found for this basename: HGBA1C,  in the last 72 hours Fasting Lipid Panel: No results found for this basename: CHOL, HDL, LDLCALC, TRIG, CHOLHDL, LDLDIRECT,  in the last 72 hours Thyroid Function Tests: No results found for this basename: TSH, T4TOTAL, FREET3, T3FREE, THYROIDAB,  in the last 72 hours Anemia Panel: No results found for this basename: VITAMINB12, FOLATE, FERRITIN, TIBC, IRON, RETICCTPCT,  in the last 72 hours  RADIOLOGY: Dg Chest Port 1 View  10/04/2013   CLINICAL DATA:  Congestive heart failure. Assess for endotracheal tube placement.  EXAM: PORTABLE CHEST - 1 VIEW  COMPARISON:  October 03, 2013.  FINDINGS: An endotracheal tube is identified with distal tip 2.2 cm from carina. Retraction by 2 cm is recommended. Nasogastric tube is unchanged. The lungs are clear. There is no focal infiltrate, pulmonary edema, or pleural effusion. The heart size  is normal. The bony structures are stable.  IMPRESSION: Endotracheal tube is identified with distal tip 2.2 cm from carina. Retraction by 2 cm recommended. There is no pneumothorax.   Electronically Signed   By: Abelardo Diesel M.D.   On: 10/04/2013 13:34   Dg Chest Port 1 View  10/03/2013   CLINICAL DATA:  Drug overdose.  Suicidal ideation.  EXAM: PORTABLE CHEST - 1 VIEW  COMPARISON:  Chest radiograph performed 09/02/2013  FINDINGS: The patient's endotracheal tube is seen ending 5 cm above the carina. An enteric tube is noted extending below the diaphragm.  The lungs are well-aerated and clear, though the left lung is difficult to fully assess due to overlying pacing pads. There is no evidence of focal opacification, pleural effusion or pneumothorax.  The cardiomediastinal silhouette is borderline  normal in size. No acute osseous abnormalities are seen. External pacing pads are noted.  IMPRESSION: 1. Endotracheal tube seen ending 5 cm above the carina. 2. No acute cardiopulmonary process seen.   Electronically Signed   By: Garald Balding M.D.   On: 10/03/2013 21:37   Mr Card Morphology Wo/w Cm  09/07/2013   CLINICAL DATA:  CAD, evaluate for viability  EXAM: CARDIAC MRI  TECHNIQUE: The patient was scanned on a 1.5 Tesla GE magnet. A dedicated cardiac coil was used. Functional imaging was done using Fiesta sequences. 2,3, and 4 chamber views were done to assess for RWMA's. Modified Simpson's rule using a short axis stack was used to calculate an ejection fraction on a dedicated work Conservation officer, nature. The patient received 20 cc of Multihance. After 10 minutes inversion recovery sequences were used to assess for infiltration and scar tissue.  CONTRAST:  20 cc  of Multihance  FINDINGS: 1. Mildly dilated left ventricular size with normal thickness and severely impaired systolic function (LVEF = 28%).  There are regional wall motion abnormalities with severe hypokinesis of the entire anterior and inferior wall, basal and mid inferoseptal walls and there is paradoxical septal motion.  The measurements are as follows:  LVEDD:  56 mm  LVESD:  51 mm  LVEDV:  137 ml  LVESV:  99 ml  SV:  38 ml  CO:  2.2 L/minute  Myocardial mass:  91 g  2. Normal right ventricular size, thickness and systolic function (RVEF = 53%).  RVEDV:  62 ml  RVESV:  29 ml  3. Mild mitral regurgitation.  Normal bi-atrial size.  Normal size aortic root, ascending aorta and pulmonary artery.  4.  There is late gadolinium enhancement in these segments:  Basal anterior:  25-50% (good prognosis)  Mid anterior:  50-75% (fair prognosis)  Apical anterior:  50-75% (fair prognosis)  Basal inferior:  75-100% (poor prognosis)  Mid inferior: 75-100% (poor prognosis)  Apical inferior: 75-100% (poor prognosis)  Basal inferoseptal: 75-100% (poor  prognosis)  IMPRESSION: 1. Mildly dilated left ventricular size with normal thickness and severely impaired systolic function (LVEF = 28%).  There are regional wall motion abnormalities with severe hypokinesis of the entire anterior and inferior wall, basal and mid inferoseptal walls and there is paradoxical septal motion.  2. Normal right ventricular size, thickness and systolic function (RVEF = 53%).  3. Conclusively, these findings are consistent with predominantly non-viable myocardium in the LAD territory.  Ena Dawley   Electronically Signed   By: Ena Dawley   On: 09/07/2013 10:26    PHYSICAL EXAM General: NAD Neck: JVP 7 cm, no thyromegaly or thyroid nodule.  Lungs: Coarse  breath sounds.  CV: Nondisplaced PMI.  Heart regular S1/S2, no S3/S4, no murmur.  No peripheral edema.  No carotid bruit.  Normal pedal pulses.  Abdomen: Soft, nontender, no hepatosplenomegaly, no distention.  Neurologic: Sleeping but has been alert/responsive.   Extremities: No clubbing or cyanosis.   TELEMETRY: Reviewed telemetry pt in NSR  ASSESSMENT AND PLAN: 58 yo with history of CAD, ischemic CMP, and ETOH abuse was admitted after she overdosed on Coreg and Xarelto.  1. Pulmonary: Stable s/p extubation, titrate off oxygen today.  2. Hypotension: Status post beta blocker overdose, stable now off dopamine. 3. Bradycardia: Resolved (s/p beta blocker overdose).  4. CAD: No chest pain.  Non-rescularizable disease.  On atorvastatin. If hemoglobin stable tomorrow, will start her on ASA 81 daily.  5. CHF: Acute on chronic systolic CHF.  EF 25-30% on last echo. Volume ok.  Holding beta blocker and lisinopril, just stopped dopamine.  Will add Lasix 20 mg daily.  6. Atrial fibrillation: Paroxysmal, maintaining NSR.  She overdosed on Xarelto.  I would be concerned about restarting her on this and would like psych input.  Would be safest if she went to SNF and meds were dispensed to her.   7. Psych: S/p overdose.   Would like psych to comment on risk of restarting her beta blocker, Xarelto.  As above, I would be more comfortable having her in SNF where meds are controlled.   [name] 10/07/2013 7:26 AM

## 2013-10-07 NOTE — Progress Notes (Signed)
Report called to receiving Rn [id number]. Will transfer via [facility] with sitter, Patient with no complaints of pain at the current time.

## 2013-10-08 DIAGNOSIS — T44901A Poisoning by unspecified drugs primarily affecting the autonomic nervous system, accidental (unintentional), initial encounter: Secondary | ICD-10-CM

## 2013-10-08 DIAGNOSIS — I10 Essential (primary) hypertension: Secondary | ICD-10-CM

## 2013-10-08 LAB — BASIC METABOLIC PANEL
ANION GAP: 13 (ref 5–15)
BUN: 10 mg/dL (ref 6–23)
CO2: 23 mEq/L (ref 19–32)
CREATININE: 1.15 mg/dL — AB (ref 0.50–1.10)
Calcium: 9.6 mg/dL (ref 8.4–10.5)
Chloride: 101 mEq/L (ref 96–112)
GFR, EST AFRICAN AMERICAN: 60 mL/min — AB (ref >90)
GFR, EST NON AFRICAN AMERICAN: 51 mL/min — AB (ref >90)
Glucose, Bld: 84 mg/dL (ref 70–99)
Potassium: 4.4 mEq/L (ref 3.7–5.3)
Sodium: 137 mEq/L (ref 137–147)

## 2013-10-08 LAB — CBC
HEMATOCRIT: 30.6 % — AB (ref 36.0–46.0)
Hemoglobin: 10.1 g/dL — ABNORMAL LOW (ref 12.0–15.0)
MCH: 29.5 pg (ref 26.0–34.0)
MCHC: 33 g/dL (ref 30.0–36.0)
MCV: 89.5 fL (ref 78.0–100.0)
Platelets: 309 10*3/uL (ref 150–400)
RBC: 3.42 MIL/uL — ABNORMAL LOW (ref 3.87–5.11)
RDW: 13.3 % (ref 11.5–15.5)
WBC: 6.1 10*3/uL (ref 4.0–10.5)

## 2013-10-08 LAB — PROTIME-INR
INR: 1.14 (ref 0.00–1.49)
Prothrombin Time: 14.6 seconds (ref 11.6–15.2)

## 2013-10-08 LAB — PHOSPHORUS: Phosphorus: 2.9 mg/dL (ref 2.3–4.6)

## 2013-10-08 LAB — MAGNESIUM: Magnesium: 2.1 mg/dL (ref 1.5–2.5)

## 2013-10-08 MED ORDER — ASPIRIN 81 MG PO CHEW
81.0000 mg | CHEWABLE_TABLET | Freq: Every day | ORAL | Status: DC
  Administered 2013-10-08 – 2013-10-13 (×6): 81 mg via ORAL
  Filled 2013-10-08 (×6): qty 1

## 2013-10-08 MED ORDER — DIGOXIN 0.0625 MG HALF TABLET
0.0625 mg | ORAL_TABLET | Freq: Every day | ORAL | Status: DC
  Administered 2013-10-08 – 2013-10-09 (×2): 0.0625 mg via ORAL
  Filled 2013-10-08 (×2): qty 1

## 2013-10-08 MED ORDER — LISINOPRIL 5 MG PO TABS
5.0000 mg | ORAL_TABLET | Freq: Every day | ORAL | Status: DC
  Administered 2013-10-08 – 2013-10-11 (×2): 5 mg via ORAL
  Filled 2013-10-08 (×5): qty 1

## 2013-10-08 MED ORDER — GUAIFENESIN-DM 100-10 MG/5ML PO SYRP
5.0000 mL | ORAL_SOLUTION | ORAL | Status: DC | PRN
  Administered 2013-10-08 – 2013-10-12 (×7): 5 mL via ORAL
  Filled 2013-10-08 (×7): qty 5

## 2013-10-08 MED ORDER — ISOSORBIDE MONONITRATE ER 30 MG PO TB24
30.0000 mg | ORAL_TABLET | Freq: Every day | ORAL | Status: DC
  Administered 2013-10-08 – 2013-10-12 (×5): 30 mg via ORAL
  Filled 2013-10-08 (×6): qty 1

## 2013-10-08 NOTE — Progress Notes (Signed)
Patient ID: [name], female   DOB: [birth year], 58 y.o.   MRN: [id number]   SUBJECTIVE: She is doing well, no dyspnea/chest pain.  Walked halls yesterday. She has seen psychiatry, not candidate for involuntary psych admission.   Scheduled Meds: . aspirin  81 mg Oral Daily  . atorvastatin  80 mg Oral q1800  . digoxin  0.0625 mg Oral Daily  . feeding supplement (RESOURCE BREEZE)  1 Container Oral TID BM  . furosemide  20 mg Oral Daily  . isosorbide mononitrate  30 mg Oral Daily  . levothyroxine  12.5 mcg Oral QAC breakfast  . lisinopril  5 mg Oral Daily  . multivitamin with minerals  1 tablet Oral Daily  . pantoprazole  40 mg Oral Q1200   Continuous Infusions:   PRN Meds:.sodium chloride, albuterol, docusate sodium, ipratropium, ondansetron    Filed Vitals:   10/07/13 1200 10/07/13 1342 10/07/13 2100 10/08/13 0527  BP: 113/69 123/74 114/76 128/70  Pulse: 71 91 86 72  Temp:  98.1 F (36.7 C) 98.5 F (36.9 C) 98.3 F (36.8 C)  TempSrc:  Oral Oral Oral  Resp: 19 15 20 18   Height:  5\' 3"  (1.6 m)    Weight:  125 lb 3.5 oz (56.8 kg)  123 lb 10.9 oz (56.1 kg)  SpO2: 96%  92% 96%    Intake/Output Summary (Last 24 hours) at 10/08/13 0753 Last data filed at 10/08/13 0600  Gross per 24 hour  Intake    980 ml  Output    300 ml  Net    680 ml    LABS: Basic Metabolic Panel:  Recent Labs  10/07/13 0430 10/08/13 0509  NA 139 137  K 4.5 4.4  CL 103 101  CO2 23 23  GLUCOSE 93 84  BUN 10 10  CREATININE 0.99 1.15*  CALCIUM 8.5 9.6  MG 2.1 2.1  PHOS 2.4 2.9   Liver Function Tests: No results found for this basename: AST, ALT, ALKPHOS, BILITOT, PROT, ALBUMIN,  in the last 72 hours No results found for this basename: LIPASE, AMYLASE,  in the last 72 hours CBC:  Recent Labs  10/07/13 0430 10/08/13 0509  WBC 6.7 6.1  HGB 8.7* 10.1*  HCT 27.1* 30.6*  MCV 90.9 89.5  PLT 251 309   Cardiac Enzymes: No results found for this basename: CKTOTAL, CKMB, CKMBINDEX,  TROPONINI,  in the last 72 hours BNP: No components found with this basename: POCBNP,  D-Dimer: No results found for this basename: DDIMER,  in the last 72 hours Hemoglobin A1C: No results found for this basename: HGBA1C,  in the last 72 hours Fasting Lipid Panel: No results found for this basename: CHOL, HDL, LDLCALC, TRIG, CHOLHDL, LDLDIRECT,  in the last 72 hours Thyroid Function Tests: No results found for this basename: TSH, T4TOTAL, FREET3, T3FREE, THYROIDAB,  in the last 72 hours Anemia Panel: No results found for this basename: VITAMINB12, FOLATE, FERRITIN, TIBC, IRON, RETICCTPCT,  in the last 72 hours  RADIOLOGY: Dg Chest Port 1 View  10/04/2013   CLINICAL DATA:  Congestive heart failure. Assess for endotracheal tube placement.  EXAM: PORTABLE CHEST - 1 VIEW  COMPARISON:  October 03, 2013.  FINDINGS: An endotracheal tube is identified with distal tip 2.2 cm from carina. Retraction by 2 cm is recommended. Nasogastric tube is unchanged. The lungs are clear. There is no focal infiltrate, pulmonary edema, or pleural effusion. The heart size is normal. The bony structures are stable.  IMPRESSION: Endotracheal tube  is identified with distal tip 2.2 cm from carina. Retraction by 2 cm recommended. There is no pneumothorax.   Electronically Signed   By: Abelardo Diesel M.D.   On: 10/04/2013 13:34   Dg Chest Port 1 View  10/03/2013   CLINICAL DATA:  Drug overdose.  Suicidal ideation.  EXAM: PORTABLE CHEST - 1 VIEW  COMPARISON:  Chest radiograph performed 09/02/2013  FINDINGS: The patient's endotracheal tube is seen ending 5 cm above the carina. An enteric tube is noted extending below the diaphragm.  The lungs are well-aerated and clear, though the left lung is difficult to fully assess due to overlying pacing pads. There is no evidence of focal opacification, pleural effusion or pneumothorax.  The cardiomediastinal silhouette is borderline normal in size. No acute osseous abnormalities are seen.  External pacing pads are noted.  IMPRESSION: 1. Endotracheal tube seen ending 5 cm above the carina. 2. No acute cardiopulmonary process seen.   Electronically Signed   By: Garald Balding M.D.   On: 10/03/2013 21:37   Mr Card Morphology Wo/w Cm  09/07/2013   CLINICAL DATA:  CAD, evaluate for viability  EXAM: CARDIAC MRI  TECHNIQUE: The patient was scanned on a 1.5 Tesla GE magnet. A dedicated cardiac coil was used. Functional imaging was done using Fiesta sequences. 2,3, and 4 chamber views were done to assess for RWMA's. Modified Simpson's rule using a short axis stack was used to calculate an ejection fraction on a dedicated work Conservation officer, nature. The patient received 20 cc of Multihance. After 10 minutes inversion recovery sequences were used to assess for infiltration and scar tissue.  CONTRAST:  20 cc  of Multihance  FINDINGS: 1. Mildly dilated left ventricular size with normal thickness and severely impaired systolic function (LVEF = 28%).  There are regional wall motion abnormalities with severe hypokinesis of the entire anterior and inferior wall, basal and mid inferoseptal walls and there is paradoxical septal motion.  The measurements are as follows:  LVEDD:  56 mm  LVESD:  51 mm  LVEDV:  137 ml  LVESV:  99 ml  SV:  38 ml  CO:  2.2 L/minute  Myocardial mass:  91 g  2. Normal right ventricular size, thickness and systolic function (RVEF = 53%).  RVEDV:  62 ml  RVESV:  29 ml  3. Mild mitral regurgitation.  Normal bi-atrial size.  Normal size aortic root, ascending aorta and pulmonary artery.  4.  There is late gadolinium enhancement in these segments:  Basal anterior:  25-50% (good prognosis)  Mid anterior:  50-75% (fair prognosis)  Apical anterior:  50-75% (fair prognosis)  Basal inferior:  75-100% (poor prognosis)  Mid inferior: 75-100% (poor prognosis)  Apical inferior: 75-100% (poor prognosis)  Basal inferoseptal: 75-100% (poor prognosis)  IMPRESSION: 1. Mildly dilated left ventricular  size with normal thickness and severely impaired systolic function (LVEF = 28%).  There are regional wall motion abnormalities with severe hypokinesis of the entire anterior and inferior wall, basal and mid inferoseptal walls and there is paradoxical septal motion.  2. Normal right ventricular size, thickness and systolic function (RVEF = 53%).  3. Conclusively, these findings are consistent with predominantly non-viable myocardium in the LAD territory.  Ena Dawley   Electronically Signed   By: Ena Dawley   On: 09/07/2013 10:26    PHYSICAL EXAM General: NAD Neck: JVP 7 cm, no thyromegaly or thyroid nodule.  Lungs: CTAB CV: Nondisplaced PMI.  Heart regular S1/S2, no S3/S4, no murmur.  No peripheral edema.  No carotid bruit.  Normal pedal pulses.  Abdomen: Soft, nontender, no hepatosplenomegaly, no distention.  Neurologic: Sleeping but has been alert/responsive.   Extremities: No clubbing or cyanosis.   TELEMETRY: Reviewed telemetry pt in NSR  ASSESSMENT AND PLAN: 58 yo with history of CAD, ischemic CMP, and ETOH abuse was admitted after she overdosed on Coreg and Xarelto.  1. Bradycardia: Resolved (s/p beta blocker overdose).  2. CAD: No chest pain.  Non-rescularizable disease.  On atorvastatin. Hemoglobin stable and INR normal, start ASA 81 mg daily.  Will also restart her home Imdur.  3. CHF: Acute on chronic systolic CHF.  EF 25-30% on last echo. Volume ok.  She is on Lasix 20 mg daily, likely can change this over to prn at discharge as it has been in the past.  Will restart lisinopril 5 mg daily. Will probably restart her Coreg 3.125 mg bid tomorrow, but she will need to go to nursing home to have her medications dispensed to her given suicide attempt.  4. Atrial fibrillation: Paroxysmal, maintaining NSR.  She overdosed on Xarelto.  I would be concerned about restarting her on this and would like psych input.  Would be safest if she went to SNF and meds were dispensed to her.   5.  Psych: S/p overdose.  As above, I would be much more comfortable having her in SNF where meds are controlled as her home situation is not good.  She wants to go back to SNF.  Will have social work start working on this today.   [name] 10/08/2013 7:53 AM

## 2013-10-08 NOTE — Progress Notes (Signed)
CSW order received to assist patient with SNF placement per MD.  Patient does not meet the medical qualifications for SNF placement. Need to consider Assisted Living or Group Home.  Full assessment to follow.  Lorie Phenix. Pauline Good, [place]

## 2013-10-08 NOTE — Progress Notes (Signed)
[facility] PROGRESS NOTE  [name] NTZ:[id number] DOB: Jun 03, 1955 DOA: 10/03/2013 PCP: No PCP Per Patient  Assessment/Plan: 1. Suicide attempt s/p medication OD 1. Seen by Psychiatry and deemed to have no indication for inpt psych admission or involuntary commitment 2. Bradycardia 1. Secondary to intentional beta blocker OD 2. Resolved 3. CAD 1. Stable. Asymptomatic 2. Imdure restarted, cont ASA and statin 4. Acute on chronic systolic chf 1. EF 67-59% 2. On 20mg  lasix 3. restarted 5mg  lisinopril 5. Paroxysmal afib 1.  6. DVT prophylaxis 7. Hx ETOH abuse  Code Status: Full Family Communication: Pt in room (indicate person spoken with, relationship, and if by phone, the number) Disposition Plan: Pending placement   Consultants:  Critical Care  Procedures:  none  Antibiotics:  none (indicate start date, and stop date if known)  HPI/Subjective: Feels better today. No complaints.  Objective: Filed Vitals:   10/07/13 1200 10/07/13 1342 10/07/13 2100 10/08/13 0527  BP: 113/69 123/74 114/76 128/70  Pulse: 71 91 86 72  Temp:  98.1 F (36.7 C) 98.5 F (36.9 C) 98.3 F (36.8 C)  TempSrc:  Oral Oral Oral  Resp: 19 15 20 18   Height:  5\' 3"  (1.6 m)    Weight:  56.8 kg (125 lb 3.5 oz)  56.1 kg (123 lb 10.9 oz)  SpO2: 96%  92% 96%    Intake/Output Summary (Last 24 hours) at 10/08/13 1138 Last data filed at 10/08/13 0859  Gross per 24 hour  Intake    840 ml  Output    200 ml  Net    640 ml   Filed Weights   10/07/13 0500 10/07/13 1342 10/08/13 0527  Weight: 57.2 kg (126 lb 1.7 oz) 56.8 kg (125 lb 3.5 oz) 56.1 kg (123 lb 10.9 oz)    Exam:   General:  Awake, in nad  Cardiovascular: regular, s1, s2  Respiratory: normal resp effort, no wheezing  Abdomen: soft,nondistended  Musculoskeletal: perfused, no clubbing   Data Reviewed: Basic Metabolic Panel:  Recent Labs Lab 10/03/13 2058 10/04/13 0020 10/04/13 1300 10/05/13 0310 10/06/13 0500  10/07/13 0430 10/08/13 0509  NA 142 144 137 136* 138 139 137  K 3.2* 3.7 4.1 3.8 3.4* 4.5 4.4  CL 107 108 104 105 102 103 101  CO2  --  18* 17* 18* 25 23 23   GLUCOSE 174* 145* 103* 134* 113* 93 84  BUN 10 12 16 15 12 10 10   CREATININE 1.60* 1.01 0.97 0.93 1.01 0.99 1.15*  CALCIUM  --  7.6* 7.7* 7.6* 8.1* 8.5 9.6  MG  --  1.5  --   --  1.8 2.1 2.1  PHOS  --  2.4  --   --  2.3 2.4 2.9   Liver Function Tests:  Recent Labs Lab 10/03/13 2045 10/04/13 1300  AST 20 24  ALT 14 17  ALKPHOS 95 110  BILITOT <0.2* 0.4  PROT 5.8* 6.5  ALBUMIN 2.8* 3.2*   No results found for this basename: LIPASE, AMYLASE,  in the last 168 hours  Recent Labs Lab 10/04/13 1300  AMMONIA 21   CBC:  Recent Labs Lab 10/03/13 2045  10/04/13 1300 10/05/13 0800 10/06/13 0500 10/07/13 0430 10/08/13 0509  WBC 3.9*  < > 14.1* 14.0* 8.7 6.7 6.1  NEUTROABS 2.1  --  12.4*  --   --   --   --   HGB 8.5*  < > 9.7* 9.6* 9.4* 8.7* 10.1*  HCT 26.0*  < > 29.3* 28.8*  28.2* 27.1* 30.6*  MCV 92.2  < > 88.8 90.3 89.8 90.9 89.5  PLT 219  < > 258 261 223 251 309  < > = values in this interval not displayed. Cardiac Enzymes: No results found for this basename: CKTOTAL, CKMB, CKMBINDEX, TROPONINI,  in the last 168 hours BNP (last 3 results)  Recent Labs  05/09/13 0340 08/17/13 0951 09/02/13 0835  PROBNP 8584.0* 789.0* 1566.0*   CBG:  Recent Labs Lab 10/05/13 1812 10/05/13 2015 10/05/13 2313 10/06/13 0846 10/06/13 1608  GLUCAP 131* 86 98 117* 81    Recent Results (from the past 240 hour(s))  MRSA PCR SCREENING     Status: None   Collection Time    10/03/13 11:32 PM      Result Value Ref Range Status   MRSA by PCR NEGATIVE  NEGATIVE Final   Comment:            The GeneXpert MRSA Assay (FDA     approved for NASAL specimens     only), is one component of a     comprehensive MRSA colonization     surveillance program. It is not     intended to diagnose MRSA     infection nor to guide or      monitor treatment for     MRSA infections.     Studies: No results found.  Scheduled Meds: . aspirin  81 mg Oral Daily  . atorvastatin  80 mg Oral q1800  . digoxin  0.0625 mg Oral Daily  . feeding supplement (RESOURCE BREEZE)  1 Container Oral TID BM  . furosemide  20 mg Oral Daily  . isosorbide mononitrate  30 mg Oral Daily  . levothyroxine  12.5 mcg Oral QAC breakfast  . lisinopril  5 mg Oral Daily  . multivitamin with minerals  1 tablet Oral Daily  . pantoprazole  40 mg Oral Q1200   Continuous Infusions:   Principal Problem:   Overdose of beta-adrenergic antagonist drug Active Problems:   Hypotension   Suicide attempt by beta blocker overdose  Time spent: 67min  CHIU, [place] [facility] Pager [phone]. If 7PM-7AM, please contact night-coverage at [email or web address], password [facility] 10/08/2013, 11:38 AM  LOS: 5 days

## 2013-10-08 NOTE — Care Management Note (Addendum)
    Page 1 of 2   10/12/2013     2:53:32 PM CARE MANAGEMENT NOTE 10/12/2013  Patient:  [name], [name]   Account Number:  [id number]  Date Initiated:  10/04/2013  Documentation initiated by:  DAVIS,RHONDA  Subjective/Objective Assessment:   overdose of prescription beta blockers/bradycardiac,ams and airway protection required.     Action/Plan:   tbd depending of course of stay and psych eval.   Anticipated DC Date:  10/08/2013   Anticipated DC Plan:  SKILLED NURSING FACILITY  In-house referral  Clinical Social Worker      DC Planning Services  CM consult  PCP issues      PAC Choice  NA   Choice offered to / List presented to:  NA   DME arranged  NA      DME agency  NA     [place] arranged  NA      [place] agency  NA   Status of service:  In process, will continue to follow Medicare Important Message given?   (If response is "NO", the following Medicare IM given date fields will be blank) Date Medicare IM given:   Medicare IM given by:   Date Additional Medicare IM given:   Additional Medicare IM given by:    Discharge Disposition:    Per UR Regulation:  Reviewed for med. necessity/level of care/duration of stay  If discussed at [place] of Stay Meetings, dates discussed:   10/12/2013    Comments:  10/12/13 [place], RN, BSN, [place] [phone] CHF: Acute on chronic systolic CHF.  EF 25-30% on last echo. Volume ok.  Continue  Lasix 20 mg daily, Switch lisinopril to 5 mg at bed time. Soft BP noted and she seemed to tolerate better at bed time in the past.   Hold off Coreg for now with HR 50s.  Psych: S/p overdose.  SW working on placement.  10/08/13 [place], RN,BSN,NCM [phone] Pt newly established PCP at [facility] with Jonetta Osgood  [id number] Davis,RN,BSn,CCM: Patient with intention O.D. ofr beta blockers and xarelto/psych-does not need inpatient admission-needs outpt tx and antidepressants/cardio would like for snf placement to regulate  medications/wouold also like psych input as to the safety of restating the beta blockers/off pressors on [id number]/extubated [id number]/weaning o2 needs [id number]/orders to transfer tele bed in place.  [id number] Rhonda Davis,RN,BSN,CCM

## 2013-10-08 NOTE — Progress Notes (Signed)
PT Cancellation Note  Patient Details Name: [name] MRN: [id number] DOB: Feb 28, 1955   Cancelled Treatment:    Reason Eval/Treat Not Completed: PT screened, no needs identified, will sign off; patient up ambulating independent in hallway.  No signs of imbalance or weakness.  No current skilled PT needs at this time.  Will sign off.   Hosie Sharman,CYNDI 10/08/2013, 12:54 PM

## 2013-10-08 NOTE — Progress Notes (Signed)
Awake watching TV, sitter at bedside. On suicide precautions. Denies any complaints. Continued to monitor

## 2013-10-09 DIAGNOSIS — I5021 Acute systolic (congestive) heart failure: Secondary | ICD-10-CM

## 2013-10-09 LAB — BASIC METABOLIC PANEL
Anion gap: 16 — ABNORMAL HIGH (ref 5–15)
BUN: 14 mg/dL (ref 6–23)
CO2: 23 meq/L (ref 19–32)
CREATININE: 1.24 mg/dL — AB (ref 0.50–1.10)
Calcium: 9.3 mg/dL (ref 8.4–10.5)
Chloride: 101 mEq/L (ref 96–112)
GFR calc Af Amer: 54 mL/min — ABNORMAL LOW (ref >90)
GFR calc non Af Amer: 47 mL/min — ABNORMAL LOW (ref >90)
Glucose, Bld: 99 mg/dL (ref 70–99)
Potassium: 4 mEq/L (ref 3.7–5.3)
SODIUM: 140 meq/L (ref 137–147)

## 2013-10-09 NOTE — Progress Notes (Signed)
   Subjective: Patient denies CP  NO SOB  Objective: Filed Vitals:   10/08/13 1410 10/08/13 1848 10/08/13 2108 10/09/13 0601  BP: 91/57 101/63 118/70 94/50  Pulse: 85 94 90 65  Temp: 98.3 F (36.8 C) 98.1 F (36.7 C) 98.5 F (36.9 C) 98.1 F (36.7 C)  TempSrc: Oral Oral Oral Oral  Resp: 20 18 16 16   Height:      Weight:    121 lb 9.6 oz (55.157 kg)  SpO2: 99% 96% 96% 96%   Weight change: -3 lb 9.9 oz (-1.643 kg)  Intake/Output Summary (Last 24 hours) at 10/09/13 1287 Last data filed at 10/09/13 0900  Gross per 24 hour  Intake    820 ml  Output    950 ml  Net   -130 ml    I/O  1.4 +  (? Complete)  General: Alert, awake, oriented x3, in no acute distress Neck:  JVP is normal Heart: Regular rate and rhythm, without murmurs, rubs, gallops.  Lungs: Clear to auscultation.  No rales or wheezes. Exemities:  No edema.   Neuro: Grossly intact, nonfocal.  Tele:  SR  60   Lab Results: Results for orders placed during the hospital encounter of 10/03/13 (from the past 24 hour(s))  BASIC METABOLIC PANEL     Status: Abnormal   Collection Time    10/09/13  4:25 AM      Result Value Ref Range   Sodium 140  137 - 147 mEq/L   Potassium 4.0  3.7 - 5.3 mEq/L   Chloride 101  96 - 112 mEq/L   CO2 23  19 - 32 mEq/L   Glucose, Bld 99  70 - 99 mg/dL   BUN 14  6 - 23 mg/dL   Creatinine, Ser 1.24 (*) 0.50 - 1.10 mg/dL   Calcium 9.3  8.4 - 10.5 mg/dL   GFR calc non Af Amer 47 (*) >90 mL/min   GFR calc Af Amer 54 (*) >90 mL/min   Anion gap 16 (*) 5 - 15    Studies/Results: No results found.  Medications: Reviewed   @PROBHOSP @  1.  Bradycardia  Resolved  WIth history of overdose I am reluctant to start as 1.  HR on lowside to start  And 2  Right now she does not have anyone to give her meds.  She cannot remember what happened  "It is a tough time"  2.  CAD  No CP  nonrevascularizable dz.  COntinue meds  Currently without symptoms of angina    3.  Chronic systolic CHF  LVEF 25 to  30%    Lisinopril started.  Volume status looks good  Hold b blocker given recent OD unless guarantee that meds would be supervised   Could say for any med she is taking  I would hold dig as well as toxicity potential is high.  4.  PAF  Overdosed on Xarelto  WIll need to follow off b blocker   5.  Pysch.  Pt would do well with assistance  She is on several meds for CHF and I do not think she is competent at times to take  Safer not to give in this situration  WIll need to follow Need input from psych and SW.    LOS: 6 days   Dorris Carnes 10/09/2013, 9:22 AM

## 2013-10-09 NOTE — Progress Notes (Signed)
[facility] PROGRESS NOTE  [name] VVO:[id number] DOB: [birth year] DOA: 10/03/2013 PCP: No PCP Per Patient  Assessment/Plan: 1. Suicide attempt s/p medication OD 1. Seen by Psychiatry and deemed to have no indication for inpt psych admission or involuntary commitment 2. Bradycardia 1. Secondary to intentional beta blocker OD 2. Resolved 3. Cardiology recs for continuing to hold beta blocker and d/c digoxin 3. CAD 1. Stable. Asymptomatic 2. Imdur was restarted, cont ASA and statin 4. Acute on chronic systolic chf 1. EF 26-94% 2. On 20mg  lasix 3. restarted 5mg  lisinopril 5. Paroxysmal afib 1. Remains rate controlled 6. DVT prophylaxis 1. SCD's 7. Hx ETOH abuse 1. Now outside withdrawal timeframe 2. Stable currently  Code Status: Full Family Communication: Pt in room  Disposition Plan: Pending placement   Consultants:  Critical Care  Procedures:  none  Antibiotics:  none (indicate start date, and stop date if known)  HPI/Subjective: Awake, in nad. No acute events noted overnight  Objective: Filed Vitals:   10/08/13 1848 10/08/13 2108 10/09/13 0601 10/09/13 0945  BP: 101/63 118/70 94/50 93/53   Pulse: 94 90 65 71  Temp: 98.1 F (36.7 C) 98.5 F (36.9 C) 98.1 F (36.7 C) 98 F (36.7 C)  TempSrc: Oral Oral Oral Oral  Resp: 18 16 16 20   Height:      Weight:   55.157 kg (121 lb 9.6 oz)   SpO2: 96% 96% 96% 96%    Intake/Output Summary (Last 24 hours) at 10/09/13 1132 Last data filed at 10/09/13 0900  Gross per 24 hour  Intake    820 ml  Output    950 ml  Net   -130 ml   Filed Weights   10/07/13 1342 10/08/13 0527 10/09/13 0601  Weight: 56.8 kg (125 lb 3.5 oz) 56.1 kg (123 lb 10.9 oz) 55.157 kg (121 lb 9.6 oz)    Exam:   General:  Awake, in nad  Cardiovascular: regular, s1, s2  Respiratory: normal resp effort, no wheezing  Abdomen: soft,nondistended  Musculoskeletal: perfused, no clubbing   Data Reviewed: Basic Metabolic  Panel:  Recent Labs Lab 10/03/13 2058 10/04/13 0020  10/05/13 0310 10/06/13 0500 10/07/13 0430 10/08/13 0509 10/09/13 0425  NA 142 144  < > 136* 138 139 137 140  K 3.2* 3.7  < > 3.8 3.4* 4.5 4.4 4.0  CL 107 108  < > 105 102 103 101 101  CO2  --  18*  < > 18* 25 23 23 23   GLUCOSE 174* 145*  < > 134* 113* 93 84 99  BUN 10 12  < > 15 12 10 10 14   CREATININE 1.60* 1.01  < > 0.93 1.01 0.99 1.15* 1.24*  CALCIUM  --  7.6*  < > 7.6* 8.1* 8.5 9.6 9.3  MG  --  1.5  --   --  1.8 2.1 2.1  --   PHOS  --  2.4  --   --  2.3 2.4 2.9  --   < > = values in this interval not displayed. Liver Function Tests:  Recent Labs Lab 10/03/13 2045 10/04/13 1300  AST 20 24  ALT 14 17  ALKPHOS 95 110  BILITOT <0.2* 0.4  PROT 5.8* 6.5  ALBUMIN 2.8* 3.2*   No results found for this basename: LIPASE, AMYLASE,  in the last 168 hours  Recent Labs Lab 10/04/13 1300  AMMONIA 21   CBC:  Recent Labs Lab 10/03/13 2045  10/04/13 1300 10/05/13 0800 10/06/13 0500 10/07/13 0430  10/08/13 0509  WBC 3.9*  < > 14.1* 14.0* 8.7 6.7 6.1  NEUTROABS 2.1  --  12.4*  --   --   --   --   HGB 8.5*  < > 9.7* 9.6* 9.4* 8.7* 10.1*  HCT 26.0*  < > 29.3* 28.8* 28.2* 27.1* 30.6*  MCV 92.2  < > 88.8 90.3 89.8 90.9 89.5  PLT 219  < > 258 261 223 251 309  < > = values in this interval not displayed. Cardiac Enzymes: No results found for this basename: CKTOTAL, CKMB, CKMBINDEX, TROPONINI,  in the last 168 hours BNP (last 3 results)  Recent Labs  05/09/13 0340 08/17/13 0951 09/02/13 0835  PROBNP 8584.0* 789.0* 1566.0*   CBG:  Recent Labs Lab 10/05/13 1812 10/05/13 2015 10/05/13 2313 10/06/13 0846 10/06/13 1608  GLUCAP 131* 86 98 117* 81    Recent Results (from the past 240 hour(s))  MRSA PCR SCREENING     Status: None   Collection Time    10/03/13 11:32 PM      Result Value Ref Range Status   MRSA by PCR NEGATIVE  NEGATIVE Final   Comment:            The GeneXpert MRSA Assay (FDA     approved  for NASAL specimens     only), is one component of a     comprehensive MRSA colonization     surveillance program. It is not     intended to diagnose MRSA     infection nor to guide or     monitor treatment for     MRSA infections.     Studies: No results found.  Scheduled Meds: . aspirin  81 mg Oral Daily  . atorvastatin  80 mg Oral q1800  . feeding supplement (RESOURCE BREEZE)  1 Container Oral TID BM  . furosemide  20 mg Oral Daily  . isosorbide mononitrate  30 mg Oral Daily  . levothyroxine  12.5 mcg Oral QAC breakfast  . lisinopril  5 mg Oral Daily  . multivitamin with minerals  1 tablet Oral Daily  . pantoprazole  40 mg Oral Q1200   Continuous Infusions:   Principal Problem:   Overdose of beta-adrenergic antagonist drug Active Problems:   Hypotension   Suicide attempt by beta blocker overdose  Time spent: 11min  CHIU, [place] [facility] Pager [phone]. If 7PM-7AM, please contact night-coverage at [email or web address], password [facility] 10/09/2013, 11:32 AM  LOS: 6 days

## 2013-10-10 MED ORDER — SERTRALINE HCL 25 MG PO TABS
25.0000 mg | ORAL_TABLET | Freq: Every day | ORAL | Status: DC
  Administered 2013-10-10 – 2013-10-13 (×4): 25 mg via ORAL
  Filled 2013-10-10 (×4): qty 1

## 2013-10-10 NOTE — Clinical Social Work Placement (Signed)
Clinical Social Work Department CLINICAL SOCIAL WORK PLACEMENT NOTE 10/10/2013  Patient:  ANNALIZA, ZIA  Account Number:  [id number] Admit date:  10/03/2013  Clinical Social Worker:  Carrington Clamp, LCSWA  Date/time:  10/10/2013 08:08 AM  Clinical Social Work is seeking post-discharge placement for this patient at the following level of care:   ASSISTED LIVING/REST HOME   (*CSW will update this form in Epic as items are completed)   10/09/2013  Patient/family provided with [place] Department of Clinical Social Work's list of facilities offering this level of care within the geographic area requested by the patient (or if unable, by the patient's family).  10/09/2013  Patient/family informed of their freedom to choose among providers that offer the needed level of care, that participate in Medicare, Medicaid or managed care program needed by the patient, have an available bed and are willing to accept the patient.  10/09/2013  Patient/family informed of [facility]' ownership interest in [facility], as well as of the fact that they are under no obligation to receive care at this facility.  PASARR submitted to EDS on Existing PASARR number received on Existing  FL2 transmitted to all facilities in geographic area requested by pt/family on  10/10/2013 FL2 transmitted to all facilities within larger geographic area on   Patient informed that his/her managed care company has contracts with or will negotiate with  certain facilities, including the following:     Patient/family informed of bed offers received:   Patient chooses bed at  Physician recommends and patient chooses bed at    Patient to be transferred to  on   Patient to be transferred to facility by  Patient and family notified of transfer on  Name of family member notified:    The following physician request were entered in Epic:   Additional Comments:  [place], [place] Weekend  Clinical Social Worker [phone]

## 2013-10-10 NOTE — Progress Notes (Signed)
[facility] PROGRESS NOTE  Ziyan Hillmer VOZ:[id number] DOB: [birth year] DOA: 10/03/2013 PCP: No PCP Per Patient  Assessment/Plan: 1. Suicide attempt s/p medication OD 1. Seen by Psychiatry and deemed to have no indication for inpt psych admission or involuntary commitment 2. Bradycardia 1. Secondary to intentional beta blocker OD 2. Resolved 3. Cardiology recs for continuing to hold beta blocker and d/c digoxin 3. CAD 1. Stable. Asymptomatic 2. Imdur was restarted recently, cont ASA and statin 4. Acute on chronic systolic chf 1. EF 42-59% 2. On 20mg  lasix 3. restarted 5mg  lisinopril 5. Paroxysmal afib 1. Remains rate controlled 6. DVT prophylaxis 1. SCD's 7. Hx ETOH abuse 1. Now outside withdrawal timeframe 2. Stable currently  Code Status: Full Family Communication: Pt in room  Disposition Plan: Awaiting placement   Consultants:  Critical Care  Procedures:  none  Antibiotics:  none (indicate start date, and stop date if known)  HPI/Subjective: No complaints. Eager to go home. Upset about having to need a bedside sitter.  Objective: Filed Vitals:   10/09/13 2029 10/10/13 0650 10/10/13 0941 10/10/13 1400  BP: 103/58 119/60 91/56 96/57   Pulse: 85 80 82 77  Temp: 98 F (36.7 C) 98.4 F (36.9 C) 98 F (36.7 C) 98 F (36.7 C)  TempSrc: Oral Oral Oral Oral  Resp: 17 18 20 20   Height:      Weight:  54.931 kg (121 lb 1.6 oz)    SpO2: 94% 100% 96% 95%    Intake/Output Summary (Last 24 hours) at 10/10/13 1751 Last data filed at 10/10/13 1738  Gross per 24 hour  Intake    600 ml  Output    150 ml  Net    450 ml   Filed Weights   10/08/13 0527 10/09/13 0601 10/10/13 0650  Weight: 56.1 kg (123 lb 10.9 oz) 55.157 kg (121 lb 9.6 oz) 54.931 kg (121 lb 1.6 oz)    Exam:   General:  Awake, in nad  Cardiovascular: regular, s1, s2  Respiratory: normal resp effort, no wheezing  Abdomen: soft,nondistended  Musculoskeletal: perfused, no clubbing    Data Reviewed: Basic Metabolic Panel:  Recent Labs Lab 10/03/13 2058 10/04/13 0020  10/05/13 0310 10/06/13 0500 10/07/13 0430 10/08/13 0509 10/09/13 0425  NA 142 144  < > 136* 138 139 137 140  K 3.2* 3.7  < > 3.8 3.4* 4.5 4.4 4.0  CL 107 108  < > 105 102 103 101 101  CO2  --  18*  < > 18* 25 23 23 23   GLUCOSE 174* 145*  < > 134* 113* 93 84 99  BUN 10 12  < > 15 12 10 10 14   CREATININE 1.60* 1.01  < > 0.93 1.01 0.99 1.15* 1.24*  CALCIUM  --  7.6*  < > 7.6* 8.1* 8.5 9.6 9.3  MG  --  1.5  --   --  1.8 2.1 2.1  --   PHOS  --  2.4  --   --  2.3 2.4 2.9  --   < > = values in this interval not displayed. Liver Function Tests:  Recent Labs Lab 10/03/13 2045 10/04/13 1300  AST 20 24  ALT 14 17  ALKPHOS 95 110  BILITOT <0.2* 0.4  PROT 5.8* 6.5  ALBUMIN 2.8* 3.2*   No results found for this basename: LIPASE, AMYLASE,  in the last 168 hours  Recent Labs Lab 10/04/13 1300  AMMONIA 21   CBC:  Recent Labs Lab 10/03/13 2045  10/04/13 1300 10/05/13 0800 10/06/13 0500 10/07/13 0430 10/08/13 0509  WBC 3.9*  < > 14.1* 14.0* 8.7 6.7 6.1  NEUTROABS 2.1  --  12.4*  --   --   --   --   HGB 8.5*  < > 9.7* 9.6* 9.4* 8.7* 10.1*  HCT 26.0*  < > 29.3* 28.8* 28.2* 27.1* 30.6*  MCV 92.2  < > 88.8 90.3 89.8 90.9 89.5  PLT 219  < > 258 261 223 251 309  < > = values in this interval not displayed. Cardiac Enzymes: No results found for this basename: CKTOTAL, CKMB, CKMBINDEX, TROPONINI,  in the last 168 hours BNP (last 3 results)  Recent Labs  05/09/13 0340 08/17/13 0951 09/02/13 0835  PROBNP 8584.0* 789.0* 1566.0*   CBG:  Recent Labs Lab 10/05/13 1812 10/05/13 2015 10/05/13 2313 10/06/13 0846 10/06/13 1608  GLUCAP 131* 86 98 117* 81    Recent Results (from the past 240 hour(s))  MRSA PCR SCREENING     Status: None   Collection Time    10/03/13 11:32 PM      Result Value Ref Range Status   MRSA by PCR NEGATIVE  NEGATIVE Final   Comment:            The  GeneXpert MRSA Assay (FDA     approved for NASAL specimens     only), is one component of a     comprehensive MRSA colonization     surveillance program. It is not     intended to diagnose MRSA     infection nor to guide or     monitor treatment for     MRSA infections.     Studies: No results found.  Scheduled Meds: . aspirin  81 mg Oral Daily  . atorvastatin  80 mg Oral q1800  . feeding supplement (RESOURCE BREEZE)  1 Container Oral TID BM  . furosemide  20 mg Oral Daily  . isosorbide mononitrate  30 mg Oral Daily  . levothyroxine  12.5 mcg Oral QAC breakfast  . lisinopril  5 mg Oral Daily  . multivitamin with minerals  1 tablet Oral Daily  . pantoprazole  40 mg Oral Q1200  . sertraline  25 mg Oral Daily   Continuous Infusions:   Principal Problem:   Overdose of beta-adrenergic antagonist drug Active Problems:   Hypotension   Suicide attempt by beta blocker overdose  Time spent: 17min  CHIU, [place] [facility] Pager [phone]. If 7PM-7AM, please contact night-coverage at [email or web address], password [facility] 10/10/2013, 5:51 PM  LOS: 7 days

## 2013-10-10 NOTE — Progress Notes (Signed)
No new recommendations from yesterday  Patient waiting on SW input regarding assisted living.

## 2013-10-10 NOTE — Clinical Social Work Note (Signed)
CSW continues to follow for discharge planning needs. CSW met with patient regarding income. Per patient, she is currently pending appointment to apply for disability. Patient reports husband works as a Curator at [employer] and what income they have is from him.   CSW spoke with Harun from [facility]. Per Harun, facility currently reviewing patient's clinicals and he will evaluate patient at hospital at 10:30am on 9/21. CSW made patient and RN Marcie Bal) aware. CSW to follow with [facility] tomorrow.  [place], [place] Weekend Clinical Social Worker [phone]
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
## 2013-10-11 MED ORDER — ONDANSETRON HCL 4 MG PO TABS
4.0000 mg | ORAL_TABLET | Freq: Three times a day (TID) | ORAL | Status: DC | PRN
  Administered 2013-10-11 – 2013-10-12 (×2): 4 mg via ORAL
  Filled 2013-10-11 (×2): qty 1

## 2013-10-11 NOTE — Progress Notes (Signed)
[facility] PROGRESS NOTE  [name] VXB:[id number] DOB: [birth year] DOA: 10/03/2013 PCP: No PCP Per Patient  Assessment/Plan: 1. Suicide attempt s/p medication OD 1. Seen by Psychiatry and deemed to have no indication for inpt psych admission or involuntary commitment 2. Bradycardia 1. Secondary to intentional beta blocker OD 2. Resolved 3. Cardiology recs for continuing to hold digoxin 4. Carvedilol was restarted, but Cardiology recs to continue as outpatient only if pt's po med administration can be monitored 3. CAD 1. Stable. Asymptomatic 2. Imdur was restarted recently, cont ASA and statin 4. Acute on chronic systolic chf 1. EF 33-00% 2. On 20mg  lasix 3. restarted 5mg  lisinopril 5. Paroxysmal afib 1. Remains rate controlled 6. DVT prophylaxis 1. SCD's 7. Hx ETOH abuse 1. Now outside withdrawal timeframe 2. Stable currently  Code Status: Full Family Communication: Pt in room  Disposition Plan: Awaiting placement   Consultants:  Critical Care  Procedures:  none  Antibiotics:  none (indicate start date, and stop date if known)  HPI/Subjective: No acute events noted overnight. Pt feels well.  Objective: Filed Vitals:   10/10/13 1400 10/10/13 2119 10/11/13 0516 10/11/13 1038  BP: 96/57 107/67 108/96 106/66  Pulse: 77 66 77 58  Temp: 98 F (36.7 C) 98.3 F (36.8 C) 98 F (36.7 C) 98 F (36.7 C)  TempSrc: Oral Oral Oral Oral  Resp: 20 18 17 18   Height:      Weight:   55 kg (121 lb 4.1 oz)   SpO2: 95% 96% 98% 96%    Intake/Output Summary (Last 24 hours) at 10/11/13 1439 Last data filed at 10/11/13 1318  Gross per 24 hour  Intake   1040 ml  Output    750 ml  Net    290 ml   Filed Weights   10/09/13 0601 10/10/13 0650 10/11/13 0516  Weight: 55.157 kg (121 lb 9.6 oz) 54.931 kg (121 lb 1.6 oz) 55 kg (121 lb 4.1 oz)    Exam:   General:  Awake, in nad  Cardiovascular: regular, s1, s2  Respiratory: normal resp effort, no  wheezing  Abdomen: soft,nondistended  Musculoskeletal: perfused, no clubbing   Data Reviewed: Basic Metabolic Panel:  Recent Labs Lab 10/05/13 0310 10/06/13 0500 10/07/13 0430 10/08/13 0509 10/09/13 0425  NA 136* 138 139 137 140  K 3.8 3.4* 4.5 4.4 4.0  CL 105 102 103 101 101  CO2 18* 25 23 23 23   GLUCOSE 134* 113* 93 84 99  BUN 15 12 10 10 14   CREATININE 0.93 1.01 0.99 1.15* 1.24*  CALCIUM 7.6* 8.1* 8.5 9.6 9.3  MG  --  1.8 2.1 2.1  --   PHOS  --  2.3 2.4 2.9  --    Liver Function Tests: No results found for this basename: AST, ALT, ALKPHOS, BILITOT, PROT, ALBUMIN,  in the last 168 hours No results found for this basename: LIPASE, AMYLASE,  in the last 168 hours No results found for this basename: AMMONIA,  in the last 168 hours CBC:  Recent Labs Lab 10/05/13 0800 10/06/13 0500 10/07/13 0430 10/08/13 0509  WBC 14.0* 8.7 6.7 6.1  HGB 9.6* 9.4* 8.7* 10.1*  HCT 28.8* 28.2* 27.1* 30.6*  MCV 90.3 89.8 90.9 89.5  PLT 261 223 251 309   Cardiac Enzymes: No results found for this basename: CKTOTAL, CKMB, CKMBINDEX, TROPONINI,  in the last 168 hours BNP (last 3 results)  Recent Labs  05/09/13 0340 08/17/13 0951 09/02/13 0835  PROBNP 8584.0* 789.0* 1566.0*  CBG:  Recent Labs Lab 10/05/13 1812 10/05/13 2015 10/05/13 2313 10/06/13 0846 10/06/13 1608  GLUCAP 131* 86 98 117* 81    Recent Results (from the past 240 hour(s))  MRSA PCR SCREENING     Status: None   Collection Time    10/03/13 11:32 PM      Result Value Ref Range Status   MRSA by PCR NEGATIVE  NEGATIVE Final   Comment:            The GeneXpert MRSA Assay (FDA     approved for NASAL specimens     only), is one component of a     comprehensive MRSA colonization     surveillance program. It is not     intended to diagnose MRSA     infection nor to guide or     monitor treatment for     MRSA infections.     Studies: No results found.  Scheduled Meds: . aspirin  81 mg Oral Daily  .  atorvastatin  80 mg Oral q1800  . feeding supplement (RESOURCE BREEZE)  1 Container Oral TID BM  . furosemide  20 mg Oral Daily  . isosorbide mononitrate  30 mg Oral Daily  . levothyroxine  12.5 mcg Oral QAC breakfast  . lisinopril  5 mg Oral Daily  . multivitamin with minerals  1 tablet Oral Daily  . pantoprazole  40 mg Oral Q1200  . sertraline  25 mg Oral Daily   Continuous Infusions:   Principal Problem:   Overdose of beta-adrenergic antagonist drug Active Problems:   Hypotension   Suicide attempt by beta blocker overdose  Time spent: 32min  CHIU, [place] [facility] Pager [phone]. If 7PM-7AM, please contact night-coverage at [email or web address], password [facility] 10/11/2013, 2:39 PM  LOS: 8 days

## 2013-10-11 NOTE — Progress Notes (Signed)
Patient ID: [name], female   DOB: [birth year], 58 y.o.   MRN: [id number]   SUBJECTIVE: She is doing well, no dyspnea/chest pain.  Walked halls yesterday. She has seen psychiatry, not candidate for involuntary psych admission.   Scheduled Meds: . aspirin  81 mg Oral Daily  . atorvastatin  80 mg Oral q1800  . feeding supplement (RESOURCE BREEZE)  1 Container Oral TID BM  . furosemide  20 mg Oral Daily  . isosorbide mononitrate  30 mg Oral Daily  . levothyroxine  12.5 mcg Oral QAC breakfast  . lisinopril  5 mg Oral Daily  . multivitamin with minerals  1 tablet Oral Daily  . pantoprazole  40 mg Oral Q1200  . sertraline  25 mg Oral Daily   Continuous Infusions:   PRN Meds:.sodium chloride, albuterol, docusate sodium, guaiFENesin-dextromethorphan, ipratropium, ondansetron    Filed Vitals:   10/10/13 0941 10/10/13 1400 10/10/13 2119 10/11/13 0516  BP: 91/56 96/57 107/67 108/96  Pulse: 82 77 66 77  Temp: 98 F (36.7 C) 98 F (36.7 C) 98.3 F (36.8 C) 98 F (36.7 C)  TempSrc: Oral Oral Oral Oral  Resp: 20 20 18 17   Height:      Weight:    121 lb 4.1 oz (55 kg)  SpO2: 96% 95% 96% 98%    Intake/Output Summary (Last 24 hours) at 10/11/13 0753 Last data filed at 10/11/13 0520  Gross per 24 hour  Intake    840 ml  Output    500 ml  Net    340 ml    LABS: Basic Metabolic Panel:  Recent Labs  10/09/13 0425  NA 140  K 4.0  CL 101  CO2 23  GLUCOSE 99  BUN 14  CREATININE 1.24*  CALCIUM 9.3   Liver Function Tests: No results found for this basename: AST, ALT, ALKPHOS, BILITOT, PROT, ALBUMIN,  in the last 72 hours No results found for this basename: LIPASE, AMYLASE,  in the last 72 hours CBC: No results found for this basename: WBC, NEUTROABS, HGB, HCT, MCV, PLT,  in the last 72 hours Cardiac Enzymes: No results found for this basename: CKTOTAL, CKMB, CKMBINDEX, TROPONINI,  in the last 72 hours BNP: No components found with this basename: POCBNP,  D-Dimer: No  results found for this basename: DDIMER,  in the last 72 hours Hemoglobin A1C: No results found for this basename: HGBA1C,  in the last 72 hours Fasting Lipid Panel: No results found for this basename: CHOL, HDL, LDLCALC, TRIG, CHOLHDL, LDLDIRECT,  in the last 72 hours Thyroid Function Tests: No results found for this basename: TSH, T4TOTAL, FREET3, T3FREE, THYROIDAB,  in the last 72 hours Anemia Panel: No results found for this basename: VITAMINB12, FOLATE, FERRITIN, TIBC, IRON, RETICCTPCT,  in the last 72 hours  RADIOLOGY: Dg Chest Port 1 View  10/04/2013   CLINICAL DATA:  Congestive heart failure. Assess for endotracheal tube placement.  EXAM: PORTABLE CHEST - 1 VIEW  COMPARISON:  October 03, 2013.  FINDINGS: An endotracheal tube is identified with distal tip 2.2 cm from carina. Retraction by 2 cm is recommended. Nasogastric tube is unchanged. The lungs are clear. There is no focal infiltrate, pulmonary edema, or pleural effusion. The heart size is normal. The bony structures are stable.  IMPRESSION: Endotracheal tube is identified with distal tip 2.2 cm from carina. Retraction by 2 cm recommended. There is no pneumothorax.   Electronically Signed   By: Abelardo Diesel M.D.   On: 10/04/2013 13:34  Dg Chest Port 1 View  10/03/2013   CLINICAL DATA:  Drug overdose.  Suicidal ideation.  EXAM: PORTABLE CHEST - 1 VIEW  COMPARISON:  Chest radiograph performed 09/02/2013  FINDINGS: The patient's endotracheal tube is seen ending 5 cm above the carina. An enteric tube is noted extending below the diaphragm.  The lungs are well-aerated and clear, though the left lung is difficult to fully assess due to overlying pacing pads. There is no evidence of focal opacification, pleural effusion or pneumothorax.  The cardiomediastinal silhouette is borderline normal in size. No acute osseous abnormalities are seen. External pacing pads are noted.  IMPRESSION: 1. Endotracheal tube seen ending 5 cm above the carina. 2.  No acute cardiopulmonary process seen.   Electronically Signed   By: Garald Balding M.D.   On: 10/03/2013 21:37   Mr Card Morphology Wo/w Cm  09/07/2013   CLINICAL DATA:  CAD, evaluate for viability  EXAM: CARDIAC MRI  TECHNIQUE: The patient was scanned on a 1.5 Tesla GE magnet. A dedicated cardiac coil was used. Functional imaging was done using Fiesta sequences. 2,3, and 4 chamber views were done to assess for RWMA's. Modified Simpson's rule using a short axis stack was used to calculate an ejection fraction on a dedicated work Conservation officer, nature. The patient received 20 cc of Multihance. After 10 minutes inversion recovery sequences were used to assess for infiltration and scar tissue.  CONTRAST:  20 cc  of Multihance  FINDINGS: 1. Mildly dilated left ventricular size with normal thickness and severely impaired systolic function (LVEF = 28%).  There are regional wall motion abnormalities with severe hypokinesis of the entire anterior and inferior wall, basal and mid inferoseptal walls and there is paradoxical septal motion.  The measurements are as follows:  LVEDD:  56 mm  LVESD:  51 mm  LVEDV:  137 ml  LVESV:  99 ml  SV:  38 ml  CO:  2.2 L/minute  Myocardial mass:  91 g  2. Normal right ventricular size, thickness and systolic function (RVEF = 53%).  RVEDV:  62 ml  RVESV:  29 ml  3. Mild mitral regurgitation.  Normal bi-atrial size.  Normal size aortic root, ascending aorta and pulmonary artery.  4.  There is late gadolinium enhancement in these segments:  Basal anterior:  25-50% (good prognosis)  Mid anterior:  50-75% (fair prognosis)  Apical anterior:  50-75% (fair prognosis)  Basal inferior:  75-100% (poor prognosis)  Mid inferior: 75-100% (poor prognosis)  Apical inferior: 75-100% (poor prognosis)  Basal inferoseptal: 75-100% (poor prognosis)  IMPRESSION: 1. Mildly dilated left ventricular size with normal thickness and severely impaired systolic function (LVEF = 28%).  There are regional wall  motion abnormalities with severe hypokinesis of the entire anterior and inferior wall, basal and mid inferoseptal walls and there is paradoxical septal motion.  2. Normal right ventricular size, thickness and systolic function (RVEF = 53%).  3. Conclusively, these findings are consistent with predominantly non-viable myocardium in the LAD territory.  Ena Dawley   Electronically Signed   By: Ena Dawley   On: 09/07/2013 10:26    PHYSICAL EXAM General: NAD Neck: JVP 7 cm, no thyromegaly or thyroid nodule.  Lungs: CTAB CV: Nondisplaced PMI.  Heart regular S1/S2, no S3/S4, no murmur.  No peripheral edema.  No carotid bruit.  Normal pedal pulses.  Abdomen: Soft, nontender, no hepatosplenomegaly, no distention.  Neurologic: Sleeping but has been alert/responsive.   Extremities: No clubbing or cyanosis.  TELEMETRY: Reviewed telemetry pt in NSR  ASSESSMENT AND PLAN: 58 yo with history of CAD, ischemic CMP, and ETOH abuse was admitted after she overdosed on Coreg and Xarelto.  1. Bradycardia: Resolved (s/p beta blocker overdose).  2. CAD: No chest pain.  Non-rescularizable disease.  On atorvastatin. Hemoglobin stable and INR normal, start ASA 81 mg daily.  Will also restart her home Imdur.  3. CHF: Acute on chronic systolic CHF.  EF 25-30% on last echo. Volume ok.  She is on Lasix 20 mg daily, likely can change this over to prn at discharge as it has been in the past.  Will restart lisinopril 5 mg daily. Will restart her Coreg 3.125 mg bid today.  This can be used as long as meds are dispensed to her in a nursing home.  Right now, she should not get this if she is going to be dosing her own meds.   4. Atrial fibrillation: Paroxysmal, maintaining NSR.  I am leery about restarting the Xarelto right now, but if she stays in SNF and meds are dispensed, may be ok eventually. 5. Psych: S/p overdose.  As above, I would be much more comfortable having her in SNF where meds are controlled as her home  situation is not good.  She wants to go back to SNF.  Social work is working on this.    [name] 10/11/2013 7:53 AM

## 2013-10-12 MED ORDER — ENSURE PUDDING PO PUDG
1.0000 | Freq: Two times a day (BID) | ORAL | Status: DC
  Administered 2013-10-12: 1 via ORAL

## 2013-10-12 MED ORDER — TUBERCULIN PPD 5 UNIT/0.1ML ID SOLN
5.0000 [IU] | Freq: Once | INTRADERMAL | Status: DC
  Administered 2013-10-12: 5 [IU] via INTRADERMAL
  Filled 2013-10-12: qty 0.1

## 2013-10-12 MED ORDER — LISINOPRIL 5 MG PO TABS
5.0000 mg | ORAL_TABLET | Freq: Every day | ORAL | Status: DC
  Administered 2013-10-12: 5 mg via ORAL
  Filled 2013-10-12 (×2): qty 1

## 2013-10-12 NOTE — Progress Notes (Signed)
Patient ID: [name], female   DOB: 03/30/1955, 58 y.o.   MRN: [id number]   SUBJECTIVE:   Yesterday lisinopril was restarted. Denies SOB.  She has seen psychiatry, not candidate for involuntary psych admission.   Denies SOB/CP Scheduled Meds: . aspirin  81 mg Oral Daily  . atorvastatin  80 mg Oral q1800  . feeding supplement (RESOURCE BREEZE)  1 Container Oral TID BM  . furosemide  20 mg Oral Daily  . isosorbide mononitrate  30 mg Oral Daily  . levothyroxine  12.5 mcg Oral QAC breakfast  . lisinopril  5 mg Oral Daily  . multivitamin with minerals  1 tablet Oral Daily  . pantoprazole  40 mg Oral Q1200  . sertraline  25 mg Oral Daily   Continuous Infusions:   PRN Meds:.sodium chloride, albuterol, docusate sodium, guaiFENesin-dextromethorphan, ipratropium, ondansetron, ondansetron    Filed Vitals:   10/11/13 1038 10/11/13 1300 10/11/13 2100 10/12/13 0622  BP: 106/66 87/52 100/54 95/53  Pulse: 58 78 60 57  Temp: 98 F (36.7 C) 97.7 F (36.5 C) 98.1 F (36.7 C) 98 F (36.7 C)  TempSrc: Oral Oral Oral Oral  Resp: 18 16 18 18   Height:      Weight:    120 lb 13 oz (54.8 kg)  SpO2: 96% 97% 94% 98%    Intake/Output Summary (Last 24 hours) at 10/12/13 0807 Last data filed at 10/12/13 0272  Gross per 24 hour  Intake   1240 ml  Output    350 ml  Net    890 ml    LABS: Basic Metabolic Panel: No results found for this basename: NA, K, CL, CO2, GLUCOSE, BUN, CREATININE, CALCIUM, MG, PHOS,  in the last 72 hours Liver Function Tests: No results found for this basename: AST, ALT, ALKPHOS, BILITOT, PROT, ALBUMIN,  in the last 72 hours No results found for this basename: LIPASE, AMYLASE,  in the last 72 hours CBC: No results found for this basename: WBC, NEUTROABS, HGB, HCT, MCV, PLT,  in the last 72 hours Cardiac Enzymes: No results found for this basename: CKTOTAL, CKMB, CKMBINDEX, TROPONINI,  in the last 72 hours BNP: No components found with this basename: POCBNP,    D-Dimer: No results found for this basename: DDIMER,  in the last 72 hours Hemoglobin A1C: No results found for this basename: HGBA1C,  in the last 72 hours Fasting Lipid Panel: No results found for this basename: CHOL, HDL, LDLCALC, TRIG, CHOLHDL, LDLDIRECT,  in the last 72 hours Thyroid Function Tests: No results found for this basename: TSH, T4TOTAL, FREET3, T3FREE, THYROIDAB,  in the last 72 hours Anemia Panel: No results found for this basename: VITAMINB12, FOLATE, FERRITIN, TIBC, IRON, RETICCTPCT,  in the last 72 hours  RADIOLOGY: Dg Chest Port 1 View  10/04/2013   CLINICAL DATA:  Congestive heart failure. Assess for endotracheal tube placement.  EXAM: PORTABLE CHEST - 1 VIEW  COMPARISON:  October 03, 2013.  FINDINGS: An endotracheal tube is identified with distal tip 2.2 cm from carina. Retraction by 2 cm is recommended. Nasogastric tube is unchanged. The lungs are clear. There is no focal infiltrate, pulmonary edema, or pleural effusion. The heart size is normal. The bony structures are stable.  IMPRESSION: Endotracheal tube is identified with distal tip 2.2 cm from carina. Retraction by 2 cm recommended. There is no pneumothorax.   Electronically Signed   By: Abelardo Diesel M.D.   On: 10/04/2013 13:34   Dg Chest Port 1 View  10/03/2013  CLINICAL DATA:  Drug overdose.  Suicidal ideation.  EXAM: PORTABLE CHEST - 1 VIEW  COMPARISON:  Chest radiograph performed 09/02/2013  FINDINGS: The patient's endotracheal tube is seen ending 5 cm above the carina. An enteric tube is noted extending below the diaphragm.  The lungs are well-aerated and clear, though the left lung is difficult to fully assess due to overlying pacing pads. There is no evidence of focal opacification, pleural effusion or pneumothorax.  The cardiomediastinal silhouette is borderline normal in size. No acute osseous abnormalities are seen. External pacing pads are noted.  IMPRESSION: 1. Endotracheal tube seen ending 5 cm above  the carina. 2. No acute cardiopulmonary process seen.   Electronically Signed   By: Garald Balding M.D.   On: 10/03/2013 21:37   Mr Card Morphology Wo/w Cm  09/07/2013   CLINICAL DATA:  CAD, evaluate for viability  EXAM: CARDIAC MRI  TECHNIQUE: The patient was scanned on a 1.5 Tesla GE magnet. A dedicated cardiac coil was used. Functional imaging was done using Fiesta sequences. 2,3, and 4 chamber views were done to assess for RWMA's. Modified Simpson's rule using a short axis stack was used to calculate an ejection fraction on a dedicated work Conservation officer, nature. The patient received 20 cc of Multihance. After 10 minutes inversion recovery sequences were used to assess for infiltration and scar tissue.  CONTRAST:  20 cc  of Multihance  FINDINGS: 1. Mildly dilated left ventricular size with normal thickness and severely impaired systolic function (LVEF = 28%).  There are regional wall motion abnormalities with severe hypokinesis of the entire anterior and inferior wall, basal and mid inferoseptal walls and there is paradoxical septal motion.  The measurements are as follows:  LVEDD:  56 mm  LVESD:  51 mm  LVEDV:  137 ml  LVESV:  99 ml  SV:  38 ml  CO:  2.2 L/minute  Myocardial mass:  91 g  2. Normal right ventricular size, thickness and systolic function (RVEF = 53%).  RVEDV:  62 ml  RVESV:  29 ml  3. Mild mitral regurgitation.  Normal bi-atrial size.  Normal size aortic root, ascending aorta and pulmonary artery.  4.  There is late gadolinium enhancement in these segments:  Basal anterior:  25-50% (good prognosis)  Mid anterior:  50-75% (fair prognosis)  Apical anterior:  50-75% (fair prognosis)  Basal inferior:  75-100% (poor prognosis)  Mid inferior: 75-100% (poor prognosis)  Apical inferior: 75-100% (poor prognosis)  Basal inferoseptal: 75-100% (poor prognosis)  IMPRESSION: 1. Mildly dilated left ventricular size with normal thickness and severely impaired systolic function (LVEF = 28%).  There are  regional wall motion abnormalities with severe hypokinesis of the entire anterior and inferior wall, basal and mid inferoseptal walls and there is paradoxical septal motion.  2. Normal right ventricular size, thickness and systolic function (RVEF = 53%).  3. Conclusively, these findings are consistent with predominantly non-viable myocardium in the LAD territory.  Ena Dawley   Electronically Signed   By: Ena Dawley   On: 09/07/2013 10:26    PHYSICAL EXAM- Sitter at bedside.  General: NAD Neck: JVP 5-6 cm, no thyromegaly or thyroid nodule.  Lungs: CTAB CV: Nondisplaced PMI.  Heart regular S1/S2, no S3/S4, no murmur.  No peripheral edema.  No carotid bruit.  Normal pedal pulses.  Abdomen: Soft, nontender, no hepatosplenomegaly, no distention.  Neurologic: Sleeping but has been alert/responsive.   Extremities: No clubbing or cyanosis.   TELEMETRY: Reviewed telemetry pt in  NSR  ASSESSMENT AND PLAN:  58 yo with history of CAD, ischemic CMP, and ETOH abuse was admitted after she overdosed on Coreg and Xarelto.  1. Bradycardia: Resolved (s/p beta blocker overdose).  2. CAD: No chest pain.  Non-rescularizable disease.  On atorvastatin. Hemoglobin stable and INR normal, Continue  ASA 81 mg daily.  Continue Imdur 30 mg daily  3. CHF: Acute on chronic systolic CHF.  EF 25-30% on last echo. Volume ok.  Continue  Lasix 20 mg daily, Switch lisinopril to 5 mg at bed time. Soft BP noted and she seemed to tolerate better at bed time in the past.   Hold off Coreg for now with HR 50s.   4. Atrial fibrillation: Paroxysmal, maintaining NSR.  Hold off on Xarelto for now.  5. Psych: S/p overdose.  SW working on placement.    CLEGG,AMY NP-C  10/12/2013 8:07 AM  Patient seen with NP, agree with the above note.  Will keep off Coreg for now with soft BP and low HR.  Continue current meds, if she goes to SNF where meds are dispensed will consider restarting Xarelto and Coreg in future.  Will need followup  CHF clinic a week or so after discharge.   [name] 10/12/2013

## 2013-10-12 NOTE — Progress Notes (Signed)
Patient IV due to be changed. Patient is not receiving anything via IV access and requested that it be removed. [place] notified, IV access discontinued. [name]

## 2013-10-12 NOTE — Progress Notes (Addendum)
[facility] PROGRESS NOTE  [name] VOH:[id number] DOB: 1955/12/20 DOA: 10/03/2013 PCP: No PCP Per Patient  Off Service Summary 58yo who presented after intentional overdose in a suicide attempt with xarelto and beta blockers. The patient did require intubation. Ultimately, the patient was weaned from ventilator and transferred to hospitalist service. Psychiatry has been following with no recommendations for inpt psych or for involuntary committment. Plan to continue to hold dig and beta blockers per cardiology. Plan is currently with discharge planning. Hopefully discharge in the next 24hrs  Assessment/Plan: 1. Suicide attempt s/p medication OD 1. Seen by Psychiatry and deemed to have no indication for inpt psych admission or involuntary commitment 2. Bradycardia 1. Secondary to intentional beta blocker OD 2. Resolved 3. Cardiology recs for continuing to hold digoxin 4. Carvedilol was restarted on 9/21 but since d/c'd secondary to low bp and HR 3. CAD 1. Stable. Asymptomatic 2. Imdur was restarted recently, cont ASA and statin 4. Acute on chronic systolic chf 1. EF 69-48% 2. On 20mg  lasix 3. restarted 5mg  lisinopril 5. Paroxysmal afib 1. Remains rate controlled 6. DVT prophylaxis 1. SCD's 7. Hx ETOH abuse 1. Now outside withdrawal timeframe 2. Stable currently  Code Status: Full Family Communication: Pt in room  Disposition Plan: Awaiting placement   Consultants:  Critical Care  Procedures:  none  Antibiotics:  none (indicate start date, and stop date if known)  HPI/Subjective: No acute events. Pt eager to be discharged  Objective: Filed Vitals:   10/11/13 2100 10/12/13 0622 10/12/13 1003 10/12/13 1514  BP: 100/54 95/53 95/55  93/56  Pulse: 60 57 65 60  Temp: 98.1 F (36.7 C) 98 F (36.7 C)  98.6 F (37 C)  TempSrc: Oral Oral  Oral  Resp: 18 18  18   Height:      Weight:  54.8 kg (120 lb 13 oz)    SpO2: 94% 98%  98%    Intake/Output Summary  (Last 24 hours) at 10/12/13 1725 Last data filed at 10/12/13 1513  Gross per 24 hour  Intake   1040 ml  Output    103 ml  Net    937 ml   Filed Weights   10/10/13 0650 10/11/13 0516 10/12/13 0622  Weight: 54.931 kg (121 lb 1.6 oz) 55 kg (121 lb 4.1 oz) 54.8 kg (120 lb 13 oz)    Exam:   General:  Awake, in nad  Cardiovascular: regular, s1, s2  Respiratory: normal resp effort, no wheezing  Abdomen: soft,nondistended  Musculoskeletal: perfused, no clubbing   Data Reviewed: Basic Metabolic Panel:  Recent Labs Lab 10/06/13 0500 10/07/13 0430 10/08/13 0509 10/09/13 0425  NA 138 139 137 140  K 3.4* 4.5 4.4 4.0  CL 102 103 101 101  CO2 25 23 23 23   GLUCOSE 113* 93 84 99  BUN 12 10 10 14   CREATININE 1.01 0.99 1.15* 1.24*  CALCIUM 8.1* 8.5 9.6 9.3  MG 1.8 2.1 2.1  --   PHOS 2.3 2.4 2.9  --    Liver Function Tests: No results found for this basename: AST, ALT, ALKPHOS, BILITOT, PROT, ALBUMIN,  in the last 168 hours No results found for this basename: LIPASE, AMYLASE,  in the last 168 hours No results found for this basename: AMMONIA,  in the last 168 hours CBC:  Recent Labs Lab 10/06/13 0500 10/07/13 0430 10/08/13 0509  WBC 8.7 6.7 6.1  HGB 9.4* 8.7* 10.1*  HCT 28.2* 27.1* 30.6*  MCV 89.8 90.9 89.5  PLT 223 251  309   Cardiac Enzymes: No results found for this basename: CKTOTAL, CKMB, CKMBINDEX, TROPONINI,  in the last 168 hours BNP (last 3 results)  Recent Labs  05/09/13 0340 08/17/13 0951 09/02/13 0835  PROBNP 8584.0* 789.0* 1566.0*   CBG:  Recent Labs Lab 10/05/13 1812 10/05/13 2015 10/05/13 2313 10/06/13 0846 10/06/13 1608  GLUCAP 131* 86 98 117* 81    Recent Results (from the past 240 hour(s))  MRSA PCR SCREENING     Status: None   Collection Time    10/03/13 11:32 PM      Result Value Ref Range Status   MRSA by PCR NEGATIVE  NEGATIVE Final   Comment:            The GeneXpert MRSA Assay (FDA     approved for NASAL specimens      only), is one component of a     comprehensive MRSA colonization     surveillance program. It is not     intended to diagnose MRSA     infection nor to guide or     monitor treatment for     MRSA infections.     Studies: No results found.  Scheduled Meds: . aspirin  81 mg Oral Daily  . atorvastatin  80 mg Oral q1800  . feeding supplement (ENSURE)  1 Container Oral BID  . furosemide  20 mg Oral Daily  . isosorbide mononitrate  30 mg Oral Daily  . levothyroxine  12.5 mcg Oral QAC breakfast  . lisinopril  5 mg Oral QHS  . multivitamin with minerals  1 tablet Oral Daily  . pantoprazole  40 mg Oral Q1200  . sertraline  25 mg Oral Daily  . tuberculin  5 Units Intradermal Once   Continuous Infusions:   Principal Problem:   Overdose of beta-adrenergic antagonist drug Active Problems:   Hypotension   Suicide attempt by beta blocker overdose  Time spent: 38min  [facility], [facility] Pager [phone]. If 7PM-7AM, please contact night-coverage at [email or web address], password [facility] 10/12/2013, 5:25 PM  LOS: 9 days

## 2013-10-12 NOTE — Progress Notes (Addendum)
NUTRITION FOLLOW-UP  INTERVENTION: Provide Ensure Pudding BID Provide snack once daily Encourage adequate PO intake Continue Multivitamin with minerals daily  NUTRITION DIAGNOSIS: Inadequate oral intake related to decreased appetite/stress as evidenced by per pt report; ongoing.   Goal: Pt to meet >/= 90% of their estimated nutrition needs; not met.    Monitor:  Diet advancement, PO intake, weight trends, labs  ASSESSMENT: 58 year old with hx of ETOH abuse admitted after suicide attempt with coreg and xarelto overdose.  Pt extubated 9/15, started on PO diet.   Pt's weight has dropped 10 lbs since admission. Pt reports that her appetite varies and she doesn't always like the food. She reports eating 50% of most meals. Per nursing notes meal completion varies from 10% to 100%. Pt states she does not like [employer] and has not been drinking them. Pt agreeable to receiving pudding and snacks.   Labs reviewed.    Height: Ht Readings from Last 1 Encounters:  10/07/13 _0  (1.6 m)    Weight: Wt Readings from Last 1 Encounters:  10/12/13 120 lb 13 oz (54.8 kg)    BMI:  Body mass index is 21.41 kg/(m^2).  Estimated Nutritional Needs: Kcal: 1500-1700 Protein: 70-85 grams Fluid: >1.5 L/day  Skin: right elbow skin tear  Diet Order: Cardiac   Intake/Output Summary (Last 24 hours) at 10/12/13 1338 Last data filed at 10/12/13 1004  Gross per 24 hour  Intake    800 ml  Output    101 ml  Net    699 ml    Last BM: 9/21  Labs:   Recent Labs Lab 10/06/13 0500 10/07/13 0430 10/08/13 0509 10/09/13 0425  NA 138 139 137 140  K 3.4* 4.5 4.4 4.0  CL 102 103 101 101  CO2 _1 BUN _2 CREATININE 1.01 0.99 1.15* 1.24*  CALCIUM 8.1* 8.5 9.6 9.3  MG 1.8 2.1 2.1  --   PHOS 2.3 2.4 2.9  --   GLUCOSE 113* 93 84 99    CBG (last 3)  No results found for this basename: GLUCAP,  in the last 72 hours  Scheduled Meds: . aspirin  81 mg Oral Daily  .  atorvastatin  80 mg Oral q1800  . feeding supplement (RESOURCE BREEZE)  1 Container Oral TID BM  . furosemide  20 mg Oral Daily  . isosorbide mononitrate  30 mg Oral Daily  . levothyroxine  12.5 mcg Oral QAC breakfast  . lisinopril  5 mg Oral QHS  . multivitamin with minerals  1 tablet Oral Daily  . pantoprazole  40 mg Oral Q1200  . sertraline  25 mg Oral Daily  . tuberculin  5 Units Intradermal Once    Continuous Infusions: none  Pryor Ochoa RD, LDN Inpatient Clinical Dietitian Pager: [phone] After Hours Pager: [phone]

## 2013-10-13 DIAGNOSIS — I2589 Other forms of chronic ischemic heart disease: Secondary | ICD-10-CM

## 2013-10-13 DIAGNOSIS — K219 Gastro-esophageal reflux disease without esophagitis: Secondary | ICD-10-CM

## 2013-10-13 LAB — BASIC METABOLIC PANEL
Anion gap: 14 (ref 5–15)
BUN: 31 mg/dL — AB (ref 6–23)
CALCIUM: 9.3 mg/dL (ref 8.4–10.5)
CO2: 27 mEq/L (ref 19–32)
Chloride: 97 mEq/L (ref 96–112)
Creatinine, Ser: 2.1 mg/dL — ABNORMAL HIGH (ref 0.50–1.10)
GFR calc Af Amer: 29 mL/min — ABNORMAL LOW (ref >90)
GFR calc non Af Amer: 25 mL/min — ABNORMAL LOW (ref >90)
GLUCOSE: 90 mg/dL (ref 70–99)
Potassium: 5.1 mEq/L (ref 3.7–5.3)
Sodium: 138 mEq/L (ref 137–147)

## 2013-10-13 MED ORDER — LORAZEPAM 0.5 MG PO TABS: 0.5000 mg | ORAL_TABLET | Freq: Two times a day (BID) | ORAL | Status: DC | PRN

## 2013-10-13 MED ORDER — ACETAMINOPHEN 325 MG PO TABS
650.0000 mg | ORAL_TABLET | ORAL | Status: DC | PRN
  Administered 2013-10-13: 650 mg via ORAL
  Filled 2013-10-13: qty 2

## 2013-10-13 MED ORDER — SODIUM CHLORIDE 0.9 % IV SOLN: INTRAVENOUS | Status: AC

## 2013-10-13 MED ORDER — ENSURE PUDDING PO PUDG: 1.0000 | Freq: Two times a day (BID) | ORAL | Status: DC

## 2013-10-13 MED ORDER — SERTRALINE HCL 25 MG PO TABS: 25.0000 mg | ORAL_TABLET | Freq: Every day | ORAL | Status: DC

## 2013-10-13 MED ORDER — PANTOPRAZOLE SODIUM 40 MG PO TBEC: 40.0000 mg | DELAYED_RELEASE_TABLET | Freq: Every day | ORAL | Status: DC

## 2013-10-13 NOTE — Progress Notes (Signed)
Patient ID: [name], female   DOB: 11-26-1955, 58 y.o.   MRN: [id number]   SUBJECTIVE:   Creatinine 1.2>2.1. Weight down another 1 pound. Overall weight down 15 pounds.  Well under her baseline 126-128 pounds.   Complaining of dizziness and headache. Denies SOB/CP Scheduled Meds: . aspirin  81 mg Oral Daily  . atorvastatin  80 mg Oral q1800  . feeding supplement (ENSURE)  1 Container Oral BID  . furosemide  20 mg Oral Daily  . isosorbide mononitrate  30 mg Oral Daily  . levothyroxine  12.5 mcg Oral QAC breakfast  . lisinopril  5 mg Oral QHS  . multivitamin with minerals  1 tablet Oral Daily  . pantoprazole  40 mg Oral Q1200  . sertraline  25 mg Oral Daily  . tuberculin  5 Units Intradermal Once   Continuous Infusions:   PRN Meds:.albuterol, docusate sodium, guaiFENesin-dextromethorphan, ipratropium, ondansetron    Filed Vitals:   10/12/13 1003 10/12/13 1514 10/12/13 2041 10/13/13 0647  BP: 95/55 93/56 99/64  80/42  Pulse: 65 60 72 57  Temp:  98.6 F (37 C) 98.1 F (36.7 C) 98.3 F (36.8 C)  TempSrc:  Oral Oral Oral  Resp:  18 18 16   Height:      Weight:    120 lb 9.6 oz (54.704 kg)  SpO2:  98% 95% 94%    Intake/Output Summary (Last 24 hours) at 10/13/13 0853 Last data filed at 10/13/13 0839  Gross per 24 hour  Intake    820 ml  Output      5 ml  Net    815 ml    LABS: Basic Metabolic Panel:  Recent Labs  10/13/13 0244  NA 138  K 5.1  CL 97  CO2 27  GLUCOSE 90  BUN 31*  CREATININE 2.10*  CALCIUM 9.3   Liver Function Tests: No results found for this basename: AST, ALT, ALKPHOS, BILITOT, PROT, ALBUMIN,  in the last 72 hours No results found for this basename: LIPASE, AMYLASE,  in the last 72 hours CBC: No results found for this basename: WBC, NEUTROABS, HGB, HCT, MCV, PLT,  in the last 72 hours Cardiac Enzymes: No results found for this basename: CKTOTAL, CKMB, CKMBINDEX, TROPONINI,  in the last 72 hours BNP: No components found with this  basename: POCBNP,  D-Dimer: No results found for this basename: DDIMER,  in the last 72 hours Hemoglobin A1C: No results found for this basename: HGBA1C,  in the last 72 hours Fasting Lipid Panel: No results found for this basename: CHOL, HDL, LDLCALC, TRIG, CHOLHDL, LDLDIRECT,  in the last 72 hours Thyroid Function Tests: No results found for this basename: TSH, T4TOTAL, FREET3, T3FREE, THYROIDAB,  in the last 72 hours Anemia Panel: No results found for this basename: VITAMINB12, FOLATE, FERRITIN, TIBC, IRON, RETICCTPCT,  in the last 72 hours  RADIOLOGY: Dg Chest Port 1 View  10/04/2013   CLINICAL DATA:  Congestive heart failure. Assess for endotracheal tube placement.  EXAM: PORTABLE CHEST - 1 VIEW  COMPARISON:  October 03, 2013.  FINDINGS: An endotracheal tube is identified with distal tip 2.2 cm from carina. Retraction by 2 cm is recommended. Nasogastric tube is unchanged. The lungs are clear. There is no focal infiltrate, pulmonary edema, or pleural effusion. The heart size is normal. The bony structures are stable.  IMPRESSION: Endotracheal tube is identified with distal tip 2.2 cm from carina. Retraction by 2 cm recommended. There is no pneumothorax.   Electronically Signed  By: Abelardo Diesel M.D.   On: 10/04/2013 13:34   Dg Chest Port 1 View  10/03/2013   CLINICAL DATA:  Drug overdose.  Suicidal ideation.  EXAM: PORTABLE CHEST - 1 VIEW  COMPARISON:  Chest radiograph performed 09/02/2013  FINDINGS: The patient's endotracheal tube is seen ending 5 cm above the carina. An enteric tube is noted extending below the diaphragm.  The lungs are well-aerated and clear, though the left lung is difficult to fully assess due to overlying pacing pads. There is no evidence of focal opacification, pleural effusion or pneumothorax.  The cardiomediastinal silhouette is borderline normal in size. No acute osseous abnormalities are seen. External pacing pads are noted.  IMPRESSION: 1. Endotracheal tube seen  ending 5 cm above the carina. 2. No acute cardiopulmonary process seen.   Electronically Signed   By: Garald Balding M.D.   On: 10/03/2013 21:37   Mr Card Morphology Wo/w Cm  09/07/2013   CLINICAL DATA:  CAD, evaluate for viability  EXAM: CARDIAC MRI  TECHNIQUE: The patient was scanned on a 1.5 Tesla GE magnet. A dedicated cardiac coil was used. Functional imaging was done using Fiesta sequences. 2,3, and 4 chamber views were done to assess for RWMA's. Modified Simpson's rule using a short axis stack was used to calculate an ejection fraction on a dedicated work Conservation officer, nature. The patient received 20 cc of Multihance. After 10 minutes inversion recovery sequences were used to assess for infiltration and scar tissue.  CONTRAST:  20 cc  of Multihance  FINDINGS: 1. Mildly dilated left ventricular size with normal thickness and severely impaired systolic function (LVEF = 28%).  There are regional wall motion abnormalities with severe hypokinesis of the entire anterior and inferior wall, basal and mid inferoseptal walls and there is paradoxical septal motion.  The measurements are as follows:  LVEDD:  56 mm  LVESD:  51 mm  LVEDV:  137 ml  LVESV:  99 ml  SV:  38 ml  CO:  2.2 L/minute  Myocardial mass:  91 g  2. Normal right ventricular size, thickness and systolic function (RVEF = 53%).  RVEDV:  62 ml  RVESV:  29 ml  3. Mild mitral regurgitation.  Normal bi-atrial size.  Normal size aortic root, ascending aorta and pulmonary artery.  4.  There is late gadolinium enhancement in these segments:  Basal anterior:  25-50% (good prognosis)  Mid anterior:  50-75% (fair prognosis)  Apical anterior:  50-75% (fair prognosis)  Basal inferior:  75-100% (poor prognosis)  Mid inferior: 75-100% (poor prognosis)  Apical inferior: 75-100% (poor prognosis)  Basal inferoseptal: 75-100% (poor prognosis)  IMPRESSION: 1. Mildly dilated left ventricular size with normal thickness and severely impaired systolic function (LVEF  = 28%).  There are regional wall motion abnormalities with severe hypokinesis of the entire anterior and inferior wall, basal and mid inferoseptal walls and there is paradoxical septal motion.  2. Normal right ventricular size, thickness and systolic function (RVEF = 53%).  3. Conclusively, these findings are consistent with predominantly non-viable myocardium in the LAD territory.  Ena Dawley   Electronically Signed   By: Ena Dawley   On: 09/07/2013 10:26    PHYSICAL EXAM- Sitter at bedside. Lying in bed.  General: NAD Neck: JVP flat  cm, no thyromegaly or thyroid nodule.  Lungs: CTAB CV: Nondisplaced PMI.  Heart regular S1/S2, no S3/S4, no murmur.  No peripheral edema.  No carotid bruit.  Normal pedal pulses.  Abdomen: Soft, nontender,  no hepatosplenomegaly, no distention.  Neurologic: Sleeping but has been alert/responsive.   Extremities: No clubbing or cyanosis.   TELEMETRY: Reviewed telemetry pt in NSR  ASSESSMENT AND PLAN:  58 yo with history of CAD, ischemic CMP, and ETOH abuse was admitted after she overdosed on Coreg and Xarelto.  1. Bradycardia: Resolved (s/p beta blocker overdose).  2. CAD: No chest pain.  Non-rescularizable disease.  On atorvastatin. Hemoglobin stable and INR normal, Continue  ASA 81 mg daily.  Continue Imdur 30 mg daily  3. CHF: Acute on chronic systolic CHF.  EF 25-30% on last echo. Volume status low. Well under her baseline. Need to allow weight to drift up. Dry weight 126 pounds. Stop lasix and hold lisinopril due to elevated creatinine. Creatinine 1.2>2.1   . Repeat BMET in am.  Encouraged to drink extra fluid today.  4. Atrial fibrillation: Paroxysmal, maintaining NSR.  Hold off on Xarelto for now.  5. Psych: S/p overdose.  SW working on placement.    CLEGG,AMY NP-C  10/13/2013 8:53 AM  Patient seen with NP, agree with the above note.  Creatinine up, BP down.  Suspect she is dry.  Stop Lasix and hold lisinopril today.  Will give a small amount  of IV fluid, repeat BMET in am.  Awaiting placement in SNF.  [name] 10/13/2013 9:12 AM

## 2013-10-13 NOTE — Discharge Summary (Signed)
Physician Discharge Summary  [name] ZOX:[id number] DOB: 03/27/1955 DOA: 10/03/2013  PCP: No PCP Per Patient  Admit date: 10/03/2013 Discharge date: 10/13/2013  Time spent: >30 minutes  Recommendations for Outpatient Follow-up:  Recheck BMET to follow renal failure Reassess BP and restart antihypertensive regimen as needed Close follow up to atrial fibrillation and restart xarelto ACE, lasix and b-blockers to be resumed by cardiology during follow up visit.  Discharge Diagnoses:  Overdose of beta-adrenergic antagonist drug Hypotension and bradycardia due to #1 PAF Acute renal failure Alcohol abuse; Hx  Discharge Condition: stable and improved. Discharge to SNF for further care and management. Will follow with heart failure clinic in 1 wwek  Diet recommendation: heart healthy diet  Filed Weights   10/11/13 0516 10/12/13 0622 10/13/13 0647  Weight: 55 kg (121 lb 4.1 oz) 54.8 kg (120 lb 13 oz) 54.704 kg (120 lb 9.6 oz)    History of present illness:  [name] with PMH of ETOH abuse, a fib, HTN, CVA, CAD who presents to ED via EMS for eval of AMS and bradycardia. Hx obtained from chart review and discussion with ED physician as pt unable to provide hx. Pt was found altered at home by EMS with multiple empty pill bottles stating that she took them in an attempt to end her life. Pt was bradycardic into the 30's in the field requiring external pacing. She became increasingly altered here requiring intubation. There is a high clinical suspicion for beta-blocker OD so glucagon 5mg  was given IV. She was also started on levophed and DA for hypotension and bradycardia.   Hospital Course:  Suicide attempt s/p medication OD  1. Seen by Psychiatry and deemed to have no indication for inpt psych admission or involuntary commitment  Bradycardia  1. Secondary to beta blocker OD 2. Essentially resolved 3. Cardiology recs for continuing to hold b-lockers currently due to low BP   CAD   1. Stable. Asymptomatic 2. Will continue statins, imdur and ASA  Chronic systolic chf  1. EF 81-19% 2. On 20mg  lasix at home; currently compensated and volume below dry weight. Will hold diuretics and ACE at this moment. Follow up with heart failure clinic in 1 week  Paroxysmal afib  1. Remains rate controlled 2. Will hold b-blocker due to low BP 3. xarelto to be resumed after patient follow with cardiology as an outpatient  Acute renal failure: due to hypotension and continue use of ACE/lasix. -ACE/lasix discontinue for now -antihypertensive drugs adjusted/held until follow up with cardiology -advise to maintain good hydration -BMET in 1 week to be rechecked   Hx ETOH abuse  1. Stable. No signs of withdrawal 2. Patient reports she has not longer been drinking    Procedures: See below for x-ray reports  Consultations:  Cardiology  Psychiatry   Discharge Exam: Filed Vitals:   10/13/13 1410  BP: 98/82  Pulse: 58  Temp: 97.3 F (36.3 C)  Resp: 18    General: feeling better, NAD, afebrile Cardiovascular: regular rate to slight bradycardia; no rubs or gallops Respiratory: CTA bilaterally Abdomen: soft, NT, ND; positive BS Extremities: no edema, no cyanosis or clubbing  Neuro: AAOX3, no focal motor deficit   Discharge Instructions You were cared for by a hospitalist during your hospital stay. If you have any questions about your discharge medications or the care you received while you were in the hospital after you are discharged, you can call the unit and asked to speak with the hospitalist on call if the hospitalist  that took care of you is not available. Once you are discharged, your primary care physician will handle any further medical issues. Please note that NO REFILLS for any discharge medications will be authorized once you are discharged, as it is imperative that you return to your primary care physician (or establish a relationship with a primary care  physician if you do not have one) for your aftercare needs so that they can reassess your need for medications and monitor your lab values.  Discharge Instructions   Diet - low sodium heart healthy    Complete by:  As directed      Discharge instructions    Complete by:  As directed   Low sodium diet (less than 2 gram daily) Check weight on daily basis Follow up with HF clinic as instructed (approx in 1 week) Please take medications as prescribed          Current Discharge Medication List    START taking these medications   Details  feeding supplement, ENSURE, (ENSURE) PUDG Take 1 Container by mouth 2 (two) times daily. Refills: 0    pantoprazole (PROTONIX) 40 MG tablet Take 1 tablet (40 mg total) by mouth daily at 12 noon.    sertraline (ZOLOFT) 25 MG tablet Take 1 tablet (25 mg total) by mouth daily.      CONTINUE these medications which have CHANGED   Details  LORazepam (ATIVAN) 0.5 MG tablet Take 1 tablet (0.5 mg total) by mouth 2 (two) times daily as needed for anxiety. Qty: 20 tablet, Refills: 0      CONTINUE these medications which have NOT CHANGED   Details  acetaminophen (TYLENOL) 325 MG tablet Take 2 tablets (650 mg total) by mouth every 4 (four) hours as needed for headache or mild pain.    aspirin 81 MG chewable tablet Chew 1 tablet (81 mg total) by mouth daily.    atorvastatin (LIPITOR) 80 MG tablet Take 0.5 tablets (40 mg total) by mouth daily at 6 PM. Qty: 30 tablet, Refills: 6    isosorbide mononitrate (IMDUR) 30 MG 24 hr tablet Take 1 tablet (30 mg total) by mouth at bedtime. Qty: 30 tablet, Refills: 6    levothyroxine (SYNTHROID, LEVOTHROID) 25 MCG tablet Take 0.5 tablets (12.5 mcg total) by mouth daily before breakfast. Qty: 30 tablet, Refills: 0    nitroGLYCERIN (NITROSTAT) 0.4 MG SL tablet Place 1 tablet (0.4 mg total) under the tongue every 5 (five) minutes as needed for chest pain. Qty: 25 tablet, Refills: 2      STOP taking these medications      carvedilol (COREG) 3.125 MG tablet      furosemide (LASIX) 20 MG tablet      lisinopril (PRINIVIL,ZESTRIL) 5 MG tablet      rivaroxaban (XARELTO) 20 MG TABS tablet      digoxin (LANOXIN) 0.125 MG tablet        Allergies  Allergen Reactions  . Penicillins Anaphylaxis and Rash  . Morphine And Related Other (See Comments)    Dizziness and hallucinations   Follow-up Information   Follow up with Oren Binet, MD.   Specialty:  Internal Medicine   Contact information:   [address] [address] [phone]       Follow up with CLEGG,AMY, NP On 10/20/2013. (Heart Failure Clinic at 8:45)    Specialty:  Nurse Practitioner   Contact information:   [address] [place] [address] [phone]       The  results of significant diagnostics from this hospitalization (including imaging, microbiology, ancillary and laboratory) are listed below for reference.    Significant Diagnostic Studies: Dg Chest Port 1 View  10/05/2013   CLINICAL DATA:  Bradycardia and hypotension.  EXAM: PORTABLE CHEST - 1 VIEW  COMPARISON:  10/04/2013  FINDINGS: Endotracheal tube remains with the tip approximately 2 cm above the carina. External pacing pad remains. Lungs show minimal atelectasis at the right base. There is no evidence of pulmonary edema, consolidation, pneumothorax, nodule or pleural fluid. The heart size is normal.  IMPRESSION: Minimal right basilar atelectasis.   Electronically Signed   By: Aletta Edouard M.D.   On: 10/05/2013 08:13   Dg Chest Port 1 View  10/04/2013   CLINICAL DATA:  Congestive heart failure. Assess for endotracheal tube placement.  EXAM: PORTABLE CHEST - 1 VIEW  COMPARISON:  October 03, 2013.  FINDINGS: An endotracheal tube is identified with distal tip 2.2 cm from carina. Retraction by 2 cm is recommended. Nasogastric tube is unchanged. The lungs are clear. There is no focal infiltrate, pulmonary edema, or pleural effusion. The heart size is  normal. The bony structures are stable.  IMPRESSION: Endotracheal tube is identified with distal tip 2.2 cm from carina. Retraction by 2 cm recommended. There is no pneumothorax.   Electronically Signed   By: Abelardo Diesel M.D.   On: 10/04/2013 13:34   Dg Chest Port 1 View  10/03/2013   CLINICAL DATA:  Drug overdose.  Suicidal ideation.  EXAM: PORTABLE CHEST - 1 VIEW  COMPARISON:  Chest radiograph performed 09/02/2013  FINDINGS: The patient's endotracheal tube is seen ending 5 cm above the carina. An enteric tube is noted extending below the diaphragm.  The lungs are well-aerated and clear, though the left lung is difficult to fully assess due to overlying pacing pads. There is no evidence of focal opacification, pleural effusion or pneumothorax.  The cardiomediastinal silhouette is borderline normal in size. No acute osseous abnormalities are seen. External pacing pads are noted.  IMPRESSION: 1. Endotracheal tube seen ending 5 cm above the carina. 2. No acute cardiopulmonary process seen.   Electronically Signed   By: Garald Balding M.D.   On: 10/03/2013 21:37    Microbiology: Recent Results (from the past 240 hour(s))  MRSA PCR SCREENING     Status: None   Collection Time    10/03/13 11:32 PM      Result Value Ref Range Status   MRSA by PCR NEGATIVE  NEGATIVE Final   Comment:            The GeneXpert MRSA Assay (FDA     approved for NASAL specimens     only), is one component of a     comprehensive MRSA colonization     surveillance program. It is not     intended to diagnose MRSA     infection nor to guide or     monitor treatment for     MRSA infections.     Labs: Basic Metabolic Panel:  Recent Labs Lab 10/07/13 0430 10/08/13 0509 10/09/13 0425 10/13/13 0244  NA 139 137 140 138  K 4.5 4.4 4.0 5.1  CL 103 101 101 97  CO2 23 23 23 27   GLUCOSE 93 84 99 90  BUN 10 10 14  31*  CREATININE 0.99 1.15* 1.24* 2.10*  CALCIUM 8.5 9.6 9.3 9.3  MG 2.1 2.1  --   --   PHOS 2.4 2.9  --    --  CBC:  Recent Labs Lab 10/07/13 0430 10/08/13 0509  WBC 6.7 6.1  HGB 8.7* 10.1*  HCT 27.1* 30.6*  MCV 90.9 89.5  PLT 251 309    BNP (last 3 results)  Recent Labs  05/09/13 0340 08/17/13 0951 09/02/13 0835  PROBNP 8584.0* 789.0* 1566.0*   CBG:  Recent Labs Lab 10/06/13 1608  GLUCAP 81    Signed:  Barton Dubois  [facility] 10/13/2013, 2:26 PM

## 2013-10-18 ENCOUNTER — Ambulatory Visit (INDEPENDENT_AMBULATORY_CARE_PROVIDER_SITE_OTHER): Payer: Medicaid Other | Admitting: Internal Medicine

## 2013-10-18 ENCOUNTER — Encounter: Payer: Self-pay | Admitting: Internal Medicine

## 2013-10-18 VITALS — BP 98/64 | HR 88 | Ht 63.0 in | Wt 123.0 lb

## 2013-10-18 DIAGNOSIS — I255 Ischemic cardiomyopathy: Secondary | ICD-10-CM

## 2013-10-18 DIAGNOSIS — I480 Paroxysmal atrial fibrillation: Secondary | ICD-10-CM

## 2013-10-18 DIAGNOSIS — I4891 Unspecified atrial fibrillation: Secondary | ICD-10-CM

## 2013-10-18 DIAGNOSIS — I2589 Other forms of chronic ischemic heart disease: Secondary | ICD-10-CM

## 2013-10-18 DIAGNOSIS — I5022 Chronic systolic (congestive) heart failure: Secondary | ICD-10-CM

## 2013-10-18 DIAGNOSIS — I509 Heart failure, unspecified: Secondary | ICD-10-CM

## 2013-10-18 NOTE — Patient Instructions (Signed)
Your physician recommends that you continue on your current medications as directed. Please refer to the Current Medication list given to you today. Your physician recommends that you schedule a follow-up appointment as needed with Dr. Allred.  

## 2013-10-18 NOTE — Progress Notes (Signed)
Referring Physician:  Dr [name] is a 58 y.o. female with a h/o ETOH abuse, poor social situation, recent suicidal attempt and ischemic CM/ CAD who is referred for EP consultation regarding risks of sudden death.  She has been followed in the advanced CHF clinic.  Previously she was a heavy drinking and smoker.  She is presently trying to quit.  She and her spouse lived in an [place].  She was initially compliant with medical therapy.  Most recently, she presented to [facility] with a medical overdose.  She was quite ill and required intubation.  She is now slowly recovering.  She is off of her cardiac medicines presently.  She is residing in an assisted living facility while she tries to get (her) life together. She is clinically stable from a CV standpoint.    Today, she denies symptoms of palpitations, chest pain, shortness of breath, orthopnea, PND, lower extremity edema, dizziness, presyncope, syncope, or neurologic sequela. The patient is t otherwise without complaint today.   Past Medical History  Diagnosis Date  . Diverticulosis   . Pancreatitis   . Hypertension   . Stroke 2011  . Hyperlipidemia   . Anxiety   . GERD (gastroesophageal reflux disease)   . ETOH abuse   . chronic systolic heart failure     a. ECHO (05/08/13): EF 20-25%, diff HK with akinesis of basal inferior wall, grade 1 DD, trivial MR, trivial TR b) RHC (05/06/13): RA 7, RV 30/2, PA 31/19 (24), PCWP 10, Fick CO/CI: 2.9/1.74, Thermo CO/Ci: 2.4/1.4, PA sat 53%  . Ischemic cardiomyopathy   . CAD (coronary artery disease)     a. LHC (04/2013): Chronically occluded LAD, moderate dz in large OM1 and severe dz and small posterior lateral vessel    . A-fib   . Suicidal overdose 10/03/13    Overdoes on Beta Blockers & Xarelto [facility] ED)   Past Surgical History  Procedure Laterality Date  . Colostomy    . Ileostomy    . Colostomy takedown      Current Outpatient Prescriptions  Medication Sig Dispense Refill  .  acetaminophen (TYLENOL) 325 MG tablet Take 2 tablets (650 mg total) by mouth every 4 (four) hours as needed for headache or mild pain.      Marland Kitchen aspirin 81 MG chewable tablet Chew 1 tablet (81 mg total) by mouth daily.      Marland Kitchen atorvastatin (LIPITOR) 40 MG tablet Take 40 mg by mouth daily.      . feeding supplement, ENSURE, (ENSURE) PUDG Take 1 Container by mouth 2 (two) times daily.    0  . isosorbide mononitrate (IMDUR) 30 MG 24 hr tablet Take 1 tablet (30 mg total) by mouth at bedtime.  30 tablet  6  . levothyroxine (SYNTHROID, LEVOTHROID) 25 MCG tablet Take 0.5 tablets (12.5 mcg total) by mouth daily before breakfast.  30 tablet  0  . LORazepam (ATIVAN) 0.5 MG tablet Take 1 tablet (0.5 mg total) by mouth 2 (two) times daily as needed for anxiety.  20 tablet  0  . nitroGLYCERIN (NITROSTAT) 0.4 MG SL tablet Place 0.4 mg under the tongue every 5 (five) minutes as needed for chest pain (MAX 3 TABLETS).      . pantoprazole (PROTONIX) 40 MG tablet Take 1 tablet (40 mg total) by mouth daily at 12 noon.      . sertraline (ZOLOFT) 25 MG tablet Take 1 tablet (25 mg total) by mouth daily.  No current facility-administered medications for this visit.    Allergies  Allergen Reactions  . Morphine And Related Other (See Comments)    Dizziness and hallucinations  . Penicillins Anaphylaxis and Rash    History   Social History  . Marital Status: Married    Spouse Name: N/A    Number of Children: 2  . Years of Education: N/A   Occupational History  . Not on file.   Social History Main Topics  . Smoking status: Current Some Day Smoker -- 1.00 packs/day for 40 years    Types: Cigarettes  . Smokeless tobacco: Never Used     Comment: she smokes occasionally, trying to quit  . Alcohol Use: Yes     Comment: 2 3 TIMES A WEEK  . Drug Use: No  . Sexual Activity: Not on file   Other Topics Concern  . Not on file   Social History Narrative   Lived previously with her spouse in an [place].  Presently  in an assisted living facility.    Family History  Problem Relation Age of Onset  . CAD Father 70    MI  . CAD Mother 44    MI  . CAD Sister 50    Stent  . CAD Brother 48    MI    ROS- All systems are reviewed and negative except as per the HPI above  Physical Exam: Filed Vitals:   10/18/13 0915  BP: 98/64  Pulse: 88  Height: 5\' 3"  (1.6 m)  Weight: 123 lb (55.792 kg)    GEN- The patient is chronically ill and thin appearing, alert and oriented x 3 today.   Head- normocephalic, atraumatic Eyes-  Sclera clear, conjunctiva pink Ears- hearing intact Oropharynx- clear with poor dentition Neck- supple, no JVP Lungs- Clear to ausculation bilaterally, normal work of breathing Heart- Regular rate and rhythm  GI- soft, NT, ND, + BS Extremities- no clubbing, cyanosis, or edema MS- no significant deformity or atrophy Skin- no rash or lesion Psych- depressed mood, flat affect Neuro- strength and sensation are intact  EKG today reveals sinus rhythm 88 bpm, LBBB (QRS 158 msec) Epic records including recent hospitalization are reviewed  Assessment and Plan:  1. Ischemic CM/ chronic systolic dysfunction/ CAD/ LBBB The patient has stable NYHA Class II symptoms presently.  She is off of her CHF medicines at this time.  She unfortunately has had a recent admission for attempted suicide/ medicine overdose.  She has very poor social support and is now living in assisted living to "get herself together". This patient is not presently a candidate for ICD for primary prevention of sudden death.  Once her psychosocial issues are improved and she has returned to appropriate medical therapy for her CHF, this can be reassessed.  I would advise that we have her in a stable state for 6 months before we reconsider ICD implantation. She will continue to follow closely with Dr Aundra Dubin in the CHF for medical treatment on the interim.  2. afib Presently off of anticoagulation due to recent overdose I  will defer timing of re initiation to Dr Aundra Dubin

## 2013-10-19 NOTE — Progress Notes (Signed)
Patient ID: [name], female   DOB: 05-Sep-1955, 58 y.o.   MRN: [id number]  PCP: N/A Primary Cardiologist: Dr. Percival Spanish  HPI: [name] is a 58 y/o female with a history of ETOH abuse, ICM, HTN, stroke and chronic systolic heart failure.  Admitted to [facility] 05/06/13 for pancreatitis. She had severely reduced systolic function with an EF of 20-25%. 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 Discharge weight was 140 pounds.   Admitted to [facility] 05/2013 with A fib RVR. Started on amiodarone 400 mg tiwce a day and Xarelto 20 mg daily. Chemically converted to NSR.  Admitted to [facility]  08/2013 wit chest pain. Troponin was elelvated. LHC was unchanged from April 2015 so she had CMRI that showed LAD territory was not viable. Plan to continue to treat medically.   Admitted 9/13 through 9/23 after attempted suicide. Overdosed on all HF medications. Discharged off carvedilol, lasix, lisinopril, xarelto and digoxin. Discharge weight 120 pounds. Discharged to [facility] which is a goup home.    Follow up for Heart Failure:  Overall feeling ok. Denies SOB/PND/Orthopnea/dizziness. Weight has been 123-124 pounds. Living at [facility] group home. Medications provided at [facility]. Smoking 5-6 cigarettes per day. Limiting fluid intake and following low salt diet. Tearful talking about suicide attempt.   ECHO 09/02/13 EF 25-30%   Labs 05/15/13 K 3.7 Creatinine 0.97 Labs 06/02/13 K 3.9 Creatinine 1.0 Dig level 1.3 --> cut back dig to 0.0625 mg Labs 06/08/13 K 4.7 Creatinine 1.1 Dig level 0.4  Labs 06/19/13: K 4.1, creatinine 1.35 Labs 08/05/13: K 4.5, creatinine 1.17, AST 25, ALT 28, cholesterol 188, TG 185, HDL 41, LDL 110 Labs 08/17/13: K 4.9 Creatinine 2.0  Labs 09/06/13: Dig level 0.9 K 4.5 Creatinine 1.13 Labs 10/13/13 K 5.1 Creatinine 2.1  SH: Lives with her husband. Unemployed. Stopped smoking and drinking alcohol  April 2015.  FH: [name] at the age of 41  ROS: All systems negative except as listed in HPI, PMH and Problem List.  Past Medical History  Diagnosis Date  . Diverticulosis   . Pancreatitis   . Hypertension   . Stroke 2011  . Hyperlipidemia   . Anxiety   . GERD (gastroesophageal reflux disease)   . ETOH abuse   . chronic systolic heart failure     a. ECHO (05/08/13): EF 20-25%, diff HK with akinesis of basal inferior wall, grade 1 DD, trivial MR, trivial TR b) RHC (05/06/13): RA 7, RV 30/2, PA 31/19 (24), PCWP 10, Fick CO/CI: 2.9/1.74, Thermo CO/Ci: 2.4/1.4, PA sat 53%  . Ischemic cardiomyopathy   . CAD (coronary artery disease)     a. LHC (04/2013): Chronically occluded LAD, moderate dz in large OM1 and severe dz and small posterior lateral vessel    . A-fib   . Suicidal overdose 10/03/13    Overdoes on Beta Blockers & Xarelto [facility] ED)    Current Outpatient Prescriptions  Medication Sig Dispense Refill  . acetaminophen (TYLENOL) 325 MG tablet Take 2 tablets (650 mg total) by mouth every 4 (four) hours as needed for headache or mild pain.      Marland Kitchen aspirin 81 MG chewable tablet Chew 1 tablet (81 mg total) by mouth daily.      Marland Kitchen atorvastatin (LIPITOR) 40 MG tablet Take 40 mg by mouth daily.      . feeding supplement, ENSURE, (ENSURE) PUDG Take 1 Container by  mouth 2 (two) times daily.    0  . isosorbide mononitrate (IMDUR) 30 MG 24 hr tablet Take 1 tablet (30 mg total) by mouth at bedtime.  30 tablet  6  . levothyroxine (SYNTHROID, LEVOTHROID) 25 MCG tablet Take 0.5 tablets (12.5 mcg total) by mouth daily before breakfast.  30 tablet  0  . pantoprazole (PROTONIX) 40 MG tablet Take 1 tablet (40 mg total) by mouth daily at 12 noon.      . sertraline (ZOLOFT) 25 MG tablet Take 1 tablet (25 mg total) by mouth daily.       No current facility-administered medications for this encounter.    Filed Vitals:   10/20/13 0858  BP: 141/77  Pulse: 100  Resp: 18  Weight: 124 lb 4 oz (56.359  kg)  SpO2: 100%    PHYSICAL EXAM: General:  Well appearing. No resp difficulty. Ambulated in the clinic with no difficulties.  HEENT: normal Neck: supple. JVP 5-6.  Carotids 2+ bilaterally; no bruits. No lymphadenopathy or thryomegaly appreciated. Cor: PMI normal. Regular rate & rhythm. No rubs, gallops or murmurs. Lungs: clear Abdomen: soft, nontender, nondistended. No hepatosplenomegaly. No bruits or masses. Good bowel sounds. Extremities: no cyanosis, clubbing, rash, edema Neuro: alert & orientedx3, cranial nerves grossly intact. Moves all 4 extremities w/o difficulty. Affect pleasant.  ASSESSMENT & PLAN:  1. Chronic Systolic Heart Failure: Primarily ICM thought ETOH may play a role.  EF 25-30%  (08/2013) Cardiac MRI - August 2015 No viability noted LAD territory. LBBB noted 08/2013.  Recently admitted for suicide attempt. She overdosed on her HF medications.  She was discharge off all HF meds except Imdur. She remains at a [facility] and she receives all medications from the staff.  - NYHA II symptoms and volume status stable.  Restart 20 mg lasix as needed for weight 128 or greater .  For now will hold off on beta blocker and will likely start at next visit.  Add 12.5 mg hydralazine three times a day. Facility will check BP daily and hold if SBP is less than 90.  On the day of discharge elevated creatinine noted which was thought to be from over diuresis so will hold off on Ace. In the past she developed hyperkalemia on spironolactone.  Check BMET now.  -  Currently holding off on ICD. Will need to be back on HF meds for 6 months and then plan to refer back to EP. - Reinforced the need and importance of daily weights, a low sodium diet, and fluid restriction (less than 2 L a day). Instructed to call the HF clinic if weight increases more than 3 lbs overnight or 5 lbs in a week.   2. CAD: LHC with chronically occluded LAD, moderate disease in a large OM 2 and severe disease and small  posterior lateral vessel. CMRI - no viability in LAD territory. Continue ASA, statin.  Add 30 mg Imdur daily at bed time. No BB due to suicide attempt.  3. Current smoker: Encouraged to stop smoking.  Declines smoking cessation.   4. Former Alcohol Abuse- Quit April 2015. .  5.  PAF- Appears to be in regular rhythm.  Off amiodarone since July . Off Xarelto for now due to suicide attempt. .   6. Bradycardia- After BB overdose. Hold off for now.  7. Suicide Attempt-  Recently hospitalized for suicide attempt --> took all HF medications. Today I referred her to HF SW will need outpatient counseling . Plan to refer  to [facility]. Continue daily Zoloft.     Follow up 2 weeks and anticipate starting BB as long as her medications are regulated.  Larkin Morelos NP-C 9:03 AM

## 2013-10-20 ENCOUNTER — Encounter (HOSPITAL_COMMUNITY): Payer: Self-pay

## 2013-10-20 ENCOUNTER — Encounter: Payer: Self-pay | Admitting: Licensed Clinical Social Worker

## 2013-10-20 ENCOUNTER — Ambulatory Visit (HOSPITAL_COMMUNITY)
Admit: 2013-10-20 | Discharge: 2013-10-20 | Disposition: A | Discharge status: Home / Self Care | Payer: Medicaid Other | Attending: Cardiology | Admitting: Cardiology

## 2013-10-20 VITALS — BP 141/77 | HR 100 | Resp 18 | Wt 124.2 lb

## 2013-10-20 DIAGNOSIS — I2589 Other forms of chronic ischemic heart disease: Secondary | ICD-10-CM | POA: Diagnosis not present

## 2013-10-20 DIAGNOSIS — I5022 Chronic systolic (congestive) heart failure: Secondary | ICD-10-CM | POA: Diagnosis not present

## 2013-10-20 DIAGNOSIS — I498 Other specified cardiac arrhythmias: Secondary | ICD-10-CM | POA: Diagnosis not present

## 2013-10-20 DIAGNOSIS — F489 Nonpsychotic mental disorder, unspecified: Secondary | ICD-10-CM | POA: Insufficient documentation

## 2013-10-20 DIAGNOSIS — F17200 Nicotine dependence, unspecified, uncomplicated: Secondary | ICD-10-CM | POA: Diagnosis not present

## 2013-10-20 DIAGNOSIS — Z720 Tobacco use: Secondary | ICD-10-CM

## 2013-10-20 DIAGNOSIS — I509 Heart failure, unspecified: Secondary | ICD-10-CM | POA: Insufficient documentation

## 2013-10-20 DIAGNOSIS — I4891 Unspecified atrial fibrillation: Secondary | ICD-10-CM | POA: Diagnosis not present

## 2013-10-20 DIAGNOSIS — I2510 Atherosclerotic heart disease of native coronary artery without angina pectoris: Secondary | ICD-10-CM | POA: Insufficient documentation

## 2013-10-20 DIAGNOSIS — Z5189 Encounter for other specified aftercare: Secondary | ICD-10-CM

## 2013-10-20 DIAGNOSIS — I255 Ischemic cardiomyopathy: Secondary | ICD-10-CM

## 2013-10-20 DIAGNOSIS — T447X2D Poisoning by beta-adrenoreceptor antagonists, intentional self-harm, subsequent encounter: Secondary | ICD-10-CM

## 2013-10-20 DIAGNOSIS — F1011 Alcohol abuse, in remission: Secondary | ICD-10-CM | POA: Insufficient documentation

## 2013-10-20 DIAGNOSIS — I480 Paroxysmal atrial fibrillation: Secondary | ICD-10-CM

## 2013-10-20 LAB — BASIC METABOLIC PANEL
Anion gap: 14 (ref 5–15)
BUN: 18 mg/dL (ref 6–23)
CALCIUM: 9.6 mg/dL (ref 8.4–10.5)
CO2: 24 mEq/L (ref 19–32)
Chloride: 104 mEq/L (ref 96–112)
Creatinine, Ser: 1.32 mg/dL — ABNORMAL HIGH (ref 0.50–1.10)
GFR calc non Af Amer: 44 mL/min — ABNORMAL LOW (ref >90)
GFR, EST AFRICAN AMERICAN: 50 mL/min — AB (ref >90)
GLUCOSE: 106 mg/dL — AB (ref 70–99)
Potassium: 4.1 mEq/L (ref 3.7–5.3)
Sodium: 142 mEq/L (ref 137–147)

## 2013-10-20 MED ORDER — FUROSEMIDE 20 MG PO TABS: 20.0000 mg | ORAL_TABLET | ORAL | Status: DC | PRN

## 2013-10-20 MED ORDER — HYDRALAZINE HCL 25 MG PO TABS: 12.5000 mg | ORAL_TABLET | Freq: Three times a day (TID) | ORAL | Status: DC

## 2013-10-20 NOTE — Progress Notes (Signed)
CSW referred to assist with referral for outpatient counseling.CSW met with patient in the clinic who reports she is residing at [name] in [place]. She reports she is very happy there and likes the support and socialization that is available to her. She is in good spirits and ready for outpatient counseling to assist her with coping skills and life issues. CSW contacted [facility] for medicaid referral for services and provided agency [name] [phone] in [place] for outpatient counseling. CSW coordinated with caseworker from [name] [phone] regarding referral and follow up with counseling. CSW will continue to follow for support and any other needs that may arise. [name] Brennan, LCSW [phone] 

## 2013-10-20 NOTE — Patient Instructions (Addendum)
Follow up in 2 weeks  Take lasix 20 mg as needed for weight 128 pounds or greater   Take hydralazine 12.5 mg three times a day  Do the following things EVERYDAY: 1) Weigh yourself in the morning before breakfast. Write it down and keep it in a log. 2) Take your medicines as prescribed 3) Eat low salt foods-Limit salt (sodium) to 2000 mg per day.  4) Stay as active as you can everyday 5) Limit all fluids for the day to less than 2 liters 6)

## 2013-10-22 ENCOUNTER — Ambulatory Visit: Payer: Medicaid Other | Attending: Family Medicine | Admitting: Family Medicine

## 2013-10-22 ENCOUNTER — Encounter: Payer: Self-pay | Admitting: Family Medicine

## 2013-10-22 VITALS — BP 110/68 | HR 61 | Temp 97.7°F | Resp 18 | Ht 63.0 in | Wt 125.0 lb

## 2013-10-22 DIAGNOSIS — Z720 Tobacco use: Secondary | ICD-10-CM | POA: Insufficient documentation

## 2013-10-22 DIAGNOSIS — I43 Cardiomyopathy in diseases classified elsewhere: Secondary | ICD-10-CM | POA: Insufficient documentation

## 2013-10-22 DIAGNOSIS — N183 Chronic kidney disease, stage 3 unspecified: Secondary | ICD-10-CM | POA: Insufficient documentation

## 2013-10-22 DIAGNOSIS — Z418 Encounter for other procedures for purposes other than remedying health state: Secondary | ICD-10-CM

## 2013-10-22 DIAGNOSIS — Z299 Encounter for prophylactic measures, unspecified: Secondary | ICD-10-CM

## 2013-10-22 DIAGNOSIS — F1021 Alcohol dependence, in remission: Secondary | ICD-10-CM | POA: Diagnosis not present

## 2013-10-22 DIAGNOSIS — Z915 Personal history of self-harm: Secondary | ICD-10-CM | POA: Diagnosis not present

## 2013-10-22 DIAGNOSIS — I5020 Unspecified systolic (congestive) heart failure: Secondary | ICD-10-CM | POA: Diagnosis not present

## 2013-10-22 DIAGNOSIS — I2510 Atherosclerotic heart disease of native coronary artery without angina pectoris: Secondary | ICD-10-CM | POA: Diagnosis not present

## 2013-10-22 DIAGNOSIS — I110 Hypertensive heart disease with heart failure: Secondary | ICD-10-CM | POA: Insufficient documentation

## 2013-10-22 DIAGNOSIS — N182 Chronic kidney disease, stage 2 (mild): Secondary | ICD-10-CM

## 2013-10-22 DIAGNOSIS — F1010 Alcohol abuse, uncomplicated: Secondary | ICD-10-CM

## 2013-10-22 DIAGNOSIS — L400 Psoriasis vulgaris: Secondary | ICD-10-CM | POA: Diagnosis present

## 2013-10-22 DIAGNOSIS — Z23 Encounter for immunization: Secondary | ICD-10-CM

## 2013-10-22 DIAGNOSIS — L409 Psoriasis, unspecified: Secondary | ICD-10-CM

## 2013-10-22 MED ORDER — AMMONIUM LACTATE 12 % EX CREA: TOPICAL_CREAM | CUTANEOUS | Status: DC | PRN

## 2013-10-22 MED ORDER — ENSURE PUDDING PO PUDG: 1.0000 | Freq: Two times a day (BID) | ORAL | Status: DC | PRN

## 2013-10-22 NOTE — Progress Notes (Signed)
   Subjective:    Patient ID: [name], female    DOB: 04-26-1955, 58 y.o.   MRN: [id number] CC: establish care, chronic psoriasis,  HPI 58 year old female presents with the manager from her family home to establish care discussed the following:  #1 psoriasis: Patient with psoriasis since age 84. She has plaques on her elbows. She quit the cream for these plaques.  #2 hypertensive cardiomyopathy with systolic congestive heart failure: Patient history of hypertension, CAD, CHF.  She denies chest pain, shortness of breath, lower extremity edema. She's compliant with antihypertensive. She's not on beta blocker due to her recent suicide attempt last month.  Soc hx: Current smoker has cut down significantly to quarter pack per day Review of Systems As per HPI     Objective:   Physical Exam BP 110/68  Pulse 61  Temp(Src) 97.7 F (36.5 C) (Oral)  Resp 18  Ht 5\' 3"  (1.6 m)  Wt 125 lb (56.7 kg)  BMI 22.15 kg/m2  SpO2 100% Wt Readings from Last 3 Encounters:  10/22/13 125 lb (56.7 kg)  10/20/13 124 lb 4 oz (56.359 kg)  10/18/13 123 lb (55.792 kg)  General appearance: alert, cooperative and no distress Lungs: clear to auscultation bilaterally Heart: regular rate and rhythm, S1, S2 normal, no murmur, click, rub or gallop Extremities: extremities normal, atraumatic, no cyanosis or edema Skin: thick densely scaly patches on both elbows        Assessment & Plan:

## 2013-10-22 NOTE — Progress Notes (Signed)
Establish care

## 2013-10-22 NOTE — Assessment & Plan Note (Signed)
A: currently in remission.  P:  Monitor q visit

## 2013-10-22 NOTE — Assessment & Plan Note (Signed)
A: chronic problem P: lac hydrin cream

## 2013-10-22 NOTE — Patient Instructions (Signed)
Mrs. Ellerman,  Thank you for coming in today. It was a pleasure meeting you. I look forward to being your primary doctor.   1. For psoriasis, prescribed lac hydrin  2. Changed ensure to prn.  You got all your shots today and are ready for the winter!  F/u in 3-4 weeks for full physical with pap smear  Dr. Adrian Blackwater

## 2013-10-25 ENCOUNTER — Ambulatory Visit: Payer: Medicaid Other
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
## 2013-10-27 ENCOUNTER — Encounter (HOSPITAL_COMMUNITY): Payer: Medicaid Other

## 2013-11-02 NOTE — Progress Notes (Signed)
Patient ID: [name], female   DOB: 1955-07-28, 58 y.o.   MRN: [id number]  PCP: N/A Primary Cardiologist: Dr. Percival Spanish  HPI: [name] is a 58 y/o female with a history of ETOH abuse, ICM, HTN, stroke and chronic systolic heart failure.  Admitted to [facility] 05/06/13 for pancreatitis. She had severely reduced systolic function with an EF of 20-25%. 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 Discharge weight was 140 pounds.   Admitted to [facility] 05/2013 with A fib RVR. Started on amiodarone 400 mg tiwce a day and Xarelto 20 mg daily. Chemically converted to NSR.  Admitted to [facility]  08/2013 wit chest pain. Troponin was elelvated. LHC was unchanged from April 2015 so she had CMRI that showed LAD territory was not viable. Plan to continue to treat medically.   Admitted 9/13 through 9/23 after attempted suicide. Overdosed on all HF medications. Discharged off carvedilol, lasix, lisinopril, xarelto and digoxin. Discharge weight 120 pounds. Discharged to [facility] which is a goup home.    Follow up for Heart Failure: Last visit hydralazine 12.5 mg tid added. Overall she feels good. Denies SOB/PND/Orthopnea. Had CP a couple times a night that goes away with nitroglycerin. Weight at home 126  Pounds. She has not needed any lasix. Living at [facility] group home. Medications provided at [facility]. Smoking 3-4  cigarettes per day. No alcohol.  Limiting fluid intake and following low salt diet. She is trying get an appointment with [facility].   ECHO 09/02/13 EF 25-30%   Labs 05/15/13 K 3.7 Creatinine 0.97 Labs 06/02/13 K 3.9 Creatinine 1.0 Dig level 1.3 --> cut back dig to 0.0625 mg Labs 06/08/13 K 4.7 Creatinine 1.1 Dig level 0.4  Labs 06/19/13: K 4.1, creatinine 1.35 Labs 08/05/13: K 4.5, creatinine 1.17, AST 25, ALT 28, cholesterol 188, TG 185, HDL 41, LDL 110 Labs 08/17/13: K 4.9 Creatinine 2.0  Labs  09/06/13: Dig level 0.9 K 4.5 Creatinine 1.13 Labs 10/13/13 K 5.1 Creatinine 2.1 10/20/13 K 4.1 Creatinine 1.32   SH: Lives with her husband. Unemployed. Stopped smoking and drinking alcohol April 2015.  FH: Father MI at the age of 41  ROS: All systems negative except as listed in HPI, PMH and Problem List.  Past Medical History  Diagnosis Date  . Diverticulosis   . Pancreatitis 1980, 2015    in the setting of ETOH  . Stroke 2011  . chronic systolic heart failure     a. ECHO (05/08/13): EF 20-25%, diff HK with akinesis of basal inferior wall, grade 1 DD, trivial MR, trivial TR b) RHC (05/06/13): RA 7, RV 30/2, PA 31/19 (24), PCWP 10, Fick CO/CI: 2.9/1.74, Thermo CO/Ci: 2.4/1.4, PA sat 53%  . Ischemic cardiomyopathy   . CAD (coronary artery disease)     a. LHC (04/2013): Chronically occluded LAD, moderate dz in large OM1 and severe dz and small posterior lateral vessel    . A-fib 2015  . Suicidal overdose 10/03/13    Overdoes on Beta Blockers & Xarelto [facility] ED)  . Anxiety 2015   . GERD (gastroesophageal reflux disease) 2015   . Hyperlipidemia 2015  . Hypertension 2015  . ETOH abuse 1981    started abusing alcohol at age 32   . Psoriasis 1968    Current Outpatient Prescriptions  Medication Sig Dispense Refill  . acetaminophen (TYLENOL) 325 MG tablet Take 2 tablets (650 mg  total) by mouth every 4 (four) hours as needed for headache or mild pain.      Marland Kitchen ammonium lactate (LAC-HYDRIN) 12 % cream Apply topically as needed for dry skin. Apply to elbows  385 g  1  . aspirin 81 MG chewable tablet Chew 1 tablet (81 mg total) by mouth daily.      Marland Kitchen atorvastatin (LIPITOR) 40 MG tablet Take 40 mg by mouth daily.      . feeding supplement, ENSURE, (ENSURE) PUDG Take 1 Container by mouth 2 (two) times daily between meals as needed.  150 g  2  . furosemide (LASIX) 20 MG tablet Take 1 tablet (20 mg total) by mouth as needed. Take 20 mg for weight 128 pounds or greater.  30 tablet  6  . hydrALAZINE  (APRESOLINE) 25 MG tablet Take 0.5 tablets (12.5 mg total) by mouth 3 (three) times daily. Please check BP daily and record. Hold for SBP < 90  45 tablet  6  . isosorbide mononitrate (IMDUR) 30 MG 24 hr tablet Take 1 tablet (30 mg total) by mouth at bedtime.  30 tablet  6  . levothyroxine (SYNTHROID, LEVOTHROID) 25 MCG tablet Take 0.5 tablets (12.5 mcg total) by mouth daily before breakfast.  30 tablet  0  . LORazepam (ATIVAN) 0.5 MG tablet Take 0.5 mg by mouth 2 (two) times daily as needed for anxiety.      . nitroGLYCERIN (NITROSTAT) 0.3 MG SL tablet Place 0.3 mg under the tongue every 5 (five) minutes as needed for chest pain.      . pantoprazole (PROTONIX) 40 MG tablet Take 1 tablet (40 mg total) by mouth daily at 12 noon.      . sertraline (ZOLOFT) 25 MG tablet Take 1 tablet (25 mg total) by mouth daily.       No current facility-administered medications for this encounter.    Filed Vitals:   11/03/13 0855  BP: 121/72  Pulse: 84  Resp: 18  Weight: 126 lb (57.153 kg)  SpO2: 100%    PHYSICAL EXAM: General:  Well appearing. No resp difficulty. Ambulated in the clinic with no difficulties. Caregiver present  HEENT: normal Neck: supple. JVP 5-6.  Carotids 2+ bilaterally; no bruits. No lymphadenopathy or thryomegaly appreciated. Cor: PMI normal. Regular rate & rhythm. No rubs, gallops or murmurs. Lungs: clear Abdomen: soft, nontender, nondistended. No hepatosplenomegaly. No bruits or masses. Good bowel sounds. Extremities: no cyanosis, clubbing, rash, edema Neuro: alert & orientedx3, cranial nerves grossly intact. Moves all 4 extremities w/o difficulty. Affect pleasant.  ASSESSMENT & PLAN:  1. Chronic Systolic Heart Failure: Primarily ICM thought ETOH may play a role.  EF 25-30%  (08/2013) Cardiac MRI - August 2015 No viability noted LAD territory. LBBB noted 08/2013.  Admitted  10/03/13 -->  suicide attempt-  she  overdosed on her HF medications.  Now we are slowly adding back HF meds.    She remains at a [place] [facility] and she receives all medications from the staff.  - NYHA II symptoms and volume status stable.  Restart 20 mg lasix as needed for weight 128 or greater .  - Will add 3.125 mg carvedilol twice a day. Instructed to hold for HF < 60.  -Continue 12.5 mg hydralazine three times a day. -Hold off on Ace. Consider at next visit.  In the past she developed hyperkalemia on spironolactone.  -  Currently holding off on ICD. Will need to be back on HF meds for 6 months and  then plan to refer back to EP. - Reinforced the need and importance of daily weights, a low sodium diet, and fluid restriction (less than 2 L a day). Instructed to call the HF clinic if weight increases more than 3 lbs overnight or 5 lbs in a week.   2. CAD: LHC with chronically occluded LAD, moderate disease in a large OM 2 and severe disease and small posterior lateral vessel. CMRI - no viability in LAD territory. Stop ASA restarting Xarelto today. Continue  statin.  Increase imdur to 60 mg daily at  bed time. Carvedilol restarted today.   3. Current smoker: Encouraged to stop smoking.  Declines smoking cessation.   4. Former Alcohol Abuse- Quit April 2015. .  5.  PAF- Appears to be in regular rhythm.  Off amiodarone since July . Restart Xarelto 20 mg daily because all medication are regulated at facility.   6. Bradycardia- After BB overdose. Resolved. Today we are restarting carvedilol 3.125 mg twice a day.   7. Suicide Attempt-  Recently hospitalized for suicide attempt --> took all HF medications. She is currently on list for [facility] to start counseling. Continue daily Zoloft.     Follow up 2 weeks. Anticipate adding Ace.     Riane Rung NP-C 8:59 AM

## 2013-11-03 ENCOUNTER — Ambulatory Visit (HOSPITAL_COMMUNITY)
Admission: RE | Admit: 2013-11-03 | Discharge: 2013-11-03 | Disposition: A | Discharge status: Home / Self Care | Payer: Medicaid Other | Source: Ambulatory Visit | Attending: Cardiology | Admitting: Cardiology

## 2013-11-03 ENCOUNTER — Encounter (HOSPITAL_COMMUNITY): Payer: Self-pay

## 2013-11-03 VITALS — BP 121/72 | HR 84 | Resp 18 | Wt 126.0 lb

## 2013-11-03 DIAGNOSIS — F1010 Alcohol abuse, uncomplicated: Secondary | ICD-10-CM

## 2013-11-03 DIAGNOSIS — E785 Hyperlipidemia, unspecified: Secondary | ICD-10-CM | POA: Diagnosis not present

## 2013-11-03 DIAGNOSIS — F1011 Alcohol abuse, in remission: Secondary | ICD-10-CM

## 2013-11-03 DIAGNOSIS — Z7982 Long term (current) use of aspirin: Secondary | ICD-10-CM | POA: Diagnosis not present

## 2013-11-03 DIAGNOSIS — L409 Psoriasis, unspecified: Secondary | ICD-10-CM | POA: Diagnosis not present

## 2013-11-03 DIAGNOSIS — I2510 Atherosclerotic heart disease of native coronary artery without angina pectoris: Secondary | ICD-10-CM | POA: Insufficient documentation

## 2013-11-03 DIAGNOSIS — F17200 Nicotine dependence, unspecified, uncomplicated: Secondary | ICD-10-CM | POA: Diagnosis not present

## 2013-11-03 DIAGNOSIS — K219 Gastro-esophageal reflux disease without esophagitis: Secondary | ICD-10-CM | POA: Insufficient documentation

## 2013-11-03 DIAGNOSIS — I255 Ischemic cardiomyopathy: Secondary | ICD-10-CM | POA: Insufficient documentation

## 2013-11-03 DIAGNOSIS — I5022 Chronic systolic (congestive) heart failure: Secondary | ICD-10-CM | POA: Diagnosis present

## 2013-11-03 DIAGNOSIS — T447X2D Poisoning by beta-adrenoreceptor antagonists, intentional self-harm, subsequent encounter: Secondary | ICD-10-CM

## 2013-11-03 DIAGNOSIS — Z79899 Other long term (current) drug therapy: Secondary | ICD-10-CM | POA: Insufficient documentation

## 2013-11-03 DIAGNOSIS — I480 Paroxysmal atrial fibrillation: Secondary | ICD-10-CM | POA: Diagnosis not present

## 2013-11-03 DIAGNOSIS — Z915 Personal history of self-harm: Secondary | ICD-10-CM | POA: Diagnosis not present

## 2013-11-03 DIAGNOSIS — F1021 Alcohol dependence, in remission: Secondary | ICD-10-CM | POA: Insufficient documentation

## 2013-11-03 DIAGNOSIS — Z8673 Personal history of transient ischemic attack (TIA), and cerebral infarction without residual deficits: Secondary | ICD-10-CM | POA: Diagnosis not present

## 2013-11-03 DIAGNOSIS — I25119 Atherosclerotic heart disease of native coronary artery with unspecified angina pectoris: Secondary | ICD-10-CM

## 2013-11-03 DIAGNOSIS — R001 Bradycardia, unspecified: Secondary | ICD-10-CM | POA: Insufficient documentation

## 2013-11-03 DIAGNOSIS — I10 Essential (primary) hypertension: Secondary | ICD-10-CM | POA: Diagnosis not present

## 2013-11-03 DIAGNOSIS — Z720 Tobacco use: Secondary | ICD-10-CM

## 2013-11-03 MED ORDER — ISOSORBIDE MONONITRATE ER 30 MG PO TB24: 60.0000 mg | ORAL_TABLET | Freq: Every day | ORAL | Status: DC

## 2013-11-03 MED ORDER — RIVAROXABAN 20 MG PO TABS: 20.0000 mg | ORAL_TABLET | Freq: Every day | ORAL | Status: DC

## 2013-11-03 MED ORDER — CARVEDILOL 3.125 MG PO TABS: 3.1250 mg | ORAL_TABLET | Freq: Two times a day (BID) | ORAL | Status: DC

## 2013-11-03 NOTE — Patient Instructions (Addendum)
Follow up in 2 weeks  Take Imdur 60 mg daily  Take carvedilol 3.125 mg twice a day. Hold for heart rate less than 60   Take Xarelto 20 mg daily  at supper time   Do the following things EVERYDAY: 1) Weigh yourself in the morning before breakfast. Write it down and keep it in a log. 2) Take your medicines as prescribed 3) Eat low salt foods-Limit salt (sodium) to 2000 mg per day.  4) Stay as active as you can everyday 5) Limit all fluids for the day to less than 2 liters

## 2013-11-15 ENCOUNTER — Encounter: Payer: Medicaid Other | Admitting: Obstetrics & Gynecology

## 2013-11-17 ENCOUNTER — Encounter (HOSPITAL_COMMUNITY): Payer: Self-pay

## 2013-11-18 ENCOUNTER — Encounter (HOSPITAL_COMMUNITY): Payer: Self-pay | Admitting: Pharmacy Technician

## 2013-11-18 ENCOUNTER — Ambulatory Visit (HOSPITAL_COMMUNITY)
Admission: RE | Admit: 2013-11-18 | Discharge: 2013-11-18 | Disposition: A | Discharge status: Home / Self Care | Payer: Medicaid Other | Source: Ambulatory Visit | Attending: Adult Health | Admitting: Adult Health

## 2013-11-18 ENCOUNTER — Encounter (HOSPITAL_COMMUNITY): Payer: Self-pay

## 2013-11-18 ENCOUNTER — Ambulatory Visit (INDEPENDENT_AMBULATORY_CARE_PROVIDER_SITE_OTHER): Payer: Medicaid Other | Admitting: Gastroenterology

## 2013-11-18 ENCOUNTER — Other Ambulatory Visit: Payer: Self-pay

## 2013-11-18 ENCOUNTER — Encounter: Payer: Self-pay | Admitting: Gastroenterology

## 2013-11-18 ENCOUNTER — Ambulatory Visit (HOSPITAL_COMMUNITY)
Admission: RE | Admit: 2013-11-18 | Discharge: 2013-11-18 | Disposition: A | Discharge status: Home / Self Care | Payer: Medicaid Other | Source: Ambulatory Visit | Attending: Internal Medicine | Admitting: Internal Medicine

## 2013-11-18 VITALS — BP 142/88 | HR 106 | Resp 18 | Wt 126.0 lb

## 2013-11-18 VITALS — BP 138/74 | HR 90 | Temp 97.2°F | Ht 63.0 in | Wt 125.0 lb

## 2013-11-18 DIAGNOSIS — F17210 Nicotine dependence, cigarettes, uncomplicated: Secondary | ICD-10-CM | POA: Insufficient documentation

## 2013-11-18 DIAGNOSIS — I5022 Chronic systolic (congestive) heart failure: Secondary | ICD-10-CM | POA: Insufficient documentation

## 2013-11-18 DIAGNOSIS — Z800 Family history of malignant neoplasm of digestive organs: Secondary | ICD-10-CM | POA: Insufficient documentation

## 2013-11-18 DIAGNOSIS — E785 Hyperlipidemia, unspecified: Secondary | ICD-10-CM | POA: Insufficient documentation

## 2013-11-18 DIAGNOSIS — R001 Bradycardia, unspecified: Secondary | ICD-10-CM | POA: Diagnosis not present

## 2013-11-18 DIAGNOSIS — I447 Left bundle-branch block, unspecified: Secondary | ICD-10-CM | POA: Insufficient documentation

## 2013-11-18 DIAGNOSIS — I2510 Atherosclerotic heart disease of native coronary artery without angina pectoris: Secondary | ICD-10-CM | POA: Insufficient documentation

## 2013-11-18 DIAGNOSIS — I10 Essential (primary) hypertension: Secondary | ICD-10-CM | POA: Diagnosis not present

## 2013-11-18 DIAGNOSIS — R042 Hemoptysis: Secondary | ICD-10-CM | POA: Insufficient documentation

## 2013-11-18 DIAGNOSIS — R110 Nausea: Secondary | ICD-10-CM

## 2013-11-18 DIAGNOSIS — Z915 Personal history of self-harm: Secondary | ICD-10-CM | POA: Insufficient documentation

## 2013-11-18 DIAGNOSIS — Z8673 Personal history of transient ischemic attack (TIA), and cerebral infarction without residual deficits: Secondary | ICD-10-CM | POA: Diagnosis not present

## 2013-11-18 DIAGNOSIS — I255 Ischemic cardiomyopathy: Secondary | ICD-10-CM | POA: Diagnosis not present

## 2013-11-18 DIAGNOSIS — K920 Hematemesis: Secondary | ICD-10-CM | POA: Insufficient documentation

## 2013-11-18 DIAGNOSIS — I480 Paroxysmal atrial fibrillation: Secondary | ICD-10-CM

## 2013-11-18 DIAGNOSIS — F1021 Alcohol dependence, in remission: Secondary | ICD-10-CM | POA: Diagnosis not present

## 2013-11-18 LAB — CBC
HEMATOCRIT: 28.3 % — AB (ref 36.0–46.0)
HEMOGLOBIN: 9.1 g/dL — AB (ref 12.0–15.0)
MCH: 28.5 pg (ref 26.0–34.0)
MCHC: 32.2 g/dL (ref 30.0–36.0)
MCV: 88.7 fL (ref 78.0–100.0)
Platelets: 369 10*3/uL (ref 150–400)
RBC: 3.19 MIL/uL — AB (ref 3.87–5.11)
RDW: 12.8 % (ref 11.5–15.5)
WBC: 5.9 10*3/uL (ref 4.0–10.5)

## 2013-11-18 LAB — BASIC METABOLIC PANEL
Anion gap: 14 (ref 5–15)
BUN: 16 mg/dL (ref 6–23)
CO2: 24 meq/L (ref 19–32)
Calcium: 9.6 mg/dL (ref 8.4–10.5)
Chloride: 103 mEq/L (ref 96–112)
Creatinine, Ser: 1.02 mg/dL (ref 0.50–1.10)
GFR calc Af Amer: 69 mL/min — ABNORMAL LOW (ref >90)
GFR, EST NON AFRICAN AMERICAN: 59 mL/min — AB (ref >90)
GLUCOSE: 87 mg/dL (ref 70–99)
POTASSIUM: 4.4 meq/L (ref 3.7–5.3)
SODIUM: 141 meq/L (ref 137–147)

## 2013-11-18 MED ORDER — ONDANSETRON HCL 4 MG PO TABS: 4.0000 mg | ORAL_TABLET | Freq: Three times a day (TID) | ORAL | Status: DC | PRN

## 2013-11-18 MED ORDER — PANTOPRAZOLE SODIUM 40 MG PO TBEC: 40.0000 mg | DELAYED_RELEASE_TABLET | Freq: Two times a day (BID) | ORAL | Status: DC

## 2013-11-18 NOTE — Progress Notes (Signed)
cc'ed to pcp °

## 2013-11-18 NOTE — Patient Instructions (Signed)
Increase Protonix to twice a day, 30 minutes before breakfast and dinner.   I have sent Zofran for nausea to your pharmacy. Take every 8 hours as needed.   Go to the emergency room if any black stool or worsening of symptoms.   We have scheduled you for an upper endoscopy for further evaluation.

## 2013-11-18 NOTE — Assessment & Plan Note (Signed)
Per records in epic, mother diagnosed in her 50s. Patient is poor historian. Unknown date of last colonoscopy. History of sigmoid colectomy in 2007 secondary to perforated diverticulitis. Need to obtain last colonoscopy reports from [facility]. May need updated lower GI evaluation in future once acute illness addressed.

## 2013-11-18 NOTE — Patient Instructions (Signed)
Follow up in 2 weeks  Stop Xarelto  Stop Aspirin  Do the following things EVERYDAY: 1) Weigh yourself in the morning before breakfast. Write it down and keep it in a log. 2) Take your medicines as prescribed 3) Eat low salt foods-Limit salt (sodium) to 2000 mg per day.  4) Stay as active as you can everyday 5) Limit all fluids for the day to less than 2 liters

## 2013-11-18 NOTE — Progress Notes (Signed)
Primary Care Physician:  [name], MD Primary Gastroenterologist:  Dr. Oneida Alar   Chief Complaint  Patient presents with  . Referral    Throwing Up Blood    HPI:   [name] presents today secondary to reports of spitting up blood. For last week and a half throwing up quarter-sized bright red blood clots. Has right-sided/RUQ abdominal pain/burning. Vomiting every other day. Associated nausea. Black stool last week but none recently. Has been on Xarelto, with last dose 10/28. No NSAIDs, aspirin powders. No pain with eating. Notes lack of appetite but no weight loss. Sometimes feels like a bubble in her esophagus that won't go up or down. Has been on Protonix for awhile.   Last colonoscopy through [facility]. No prior EGD. History of ETOH abuse, ETOH pancreatitis, attempted suicide via overdose of heart failure medications in Sept 2015. Lives with [facility]  Past Medical History  Diagnosis Date  . Diverticulosis   . Pancreatitis 1980, 2015    in the setting of ETOH  . Stroke 2011  . chronic systolic heart failure     a. ECHO (05/08/13): EF 20-25%, diff HK with akinesis of basal inferior wall, grade 1 DD, trivial MR, trivial TR b) RHC (05/06/13): RA 7, RV 30/2, PA 31/19 (24), PCWP 10, Fick CO/CI: 2.9/1.74, Thermo CO/Ci: 2.4/1.4, PA sat 53%  . Ischemic cardiomyopathy   . CAD (coronary artery disease)     a. LHC (04/2013): Chronically occluded LAD, moderate dz in large OM1 and severe dz and small posterior lateral vessel    . A-fib 2015  . Suicidal overdose 10/03/13    Overdoes on Beta Blockers & Xarelto [facility] ED)  . Anxiety 2015   . GERD (gastroesophageal reflux disease) 2015   . Hyperlipidemia 2015  . Hypertension 2015  . ETOH abuse 1981    started abusing alcohol at age 48   . Psoriasis 1968    Past Surgical History  Procedure Laterality Date  . Colostomy takedown  2007  . Exploratory laparotomy with colon resection, colostomy  2007    Current Outpatient  Prescriptions  Medication Sig Dispense Refill  . ammonium lactate (LAC-HYDRIN) 12 % cream Apply topically as needed for dry skin. Apply to elbows  385 g  1  . atorvastatin (LIPITOR) 40 MG tablet Take 40 mg by mouth daily.      . carvedilol (COREG) 3.125 MG tablet Take 1 tablet (3.125 mg total) by mouth 2 (two) times daily with a meal. Hold for heart rate less than 60  60 tablet  6  . furosemide (LASIX) 20 MG tablet Take 1 tablet (20 mg total) by mouth as needed. Take 20 mg for weight 128 pounds or greater.  30 tablet  6  . hydrALAZINE (APRESOLINE) 25 MG tablet Take 0.5 tablets (12.5 mg total) by mouth 3 (three) times daily. Please check BP daily and record. Hold for SBP < 90  45 tablet  6  . isosorbide mononitrate (IMDUR) 30 MG 24 hr tablet Take 2 tablets (60 mg total) by mouth at bedtime.  60 tablet  6  . levothyroxine (SYNTHROID, LEVOTHROID) 25 MCG tablet Take 0.5 tablets (12.5 mcg total) by mouth daily before breakfast.  30 tablet  0  . LORazepam (ATIVAN) 0.5 MG tablet Take 0.5 mg by mouth 2 (two) times daily as needed for anxiety.      . nitroGLYCERIN (NITROSTAT) 0.3 MG SL tablet Place 0.3 mg under the tongue every 5 (five) minutes  as needed for chest pain.      Marland Kitchen sertraline (ZOLOFT) 25 MG tablet Take 1 tablet (25 mg total) by mouth daily.      . ondansetron (ZOFRAN) 4 MG tablet Take 1 tablet (4 mg total) by mouth every 8 (eight) hours as needed for nausea or vomiting.  30 tablet  1  . pantoprazole (PROTONIX) 40 MG tablet Take 1 tablet (40 mg total) by mouth 2 (two) times daily before a meal.  60 tablet  3   No current facility-administered medications for this visit.    Allergies as of 11/18/2013 - Review Complete 11/18/2013  Allergen Reaction Noted  . Morphine and related Other (See Comments) 09/24/2013  . Penicillins Anaphylaxis and Rash 05/06/2013    Family History  Problem Relation Age of Onset  . CAD Father 57    MI  . CAD Mother 66    MI  . Alcohol abuse Mother   . CAD  Sister 47    Stent  . Diabetes Sister   . CAD Brother 8    MI  . Cancer Neg Hx   . Colon cancer Mother     in her 46s    History   Social History  . Marital Status: Married    Spouse Name: N/A    Number of Children: 2  . Years of Education: 10   Occupational History  . Unemployed     Social History Main Topics  . Smoking status: Current Some Day Smoker -- 0.25 packs/day for 47 years    Types: Cigarettes  . Smokeless tobacco: Never Used     Comment: she smokes occasionally, trying to quit  . Alcohol Use: No     Comment: last drink in 03/2013   . Drug Use: No  . Sexual Activity: No   Other Topics Concern  . Not on file   Social History Narrative   Lived previously with her spouse in an [place].     Son and daughter   Daughter in [place]   Son in [place]   Two grandchildren.       Presently in an assisted living facility [place], moved in 10/13/13.           Review of Systems: Gen: Denies any fever, chills, fatigue, weight loss, lack of appetite.  CV: +palpitations Resp: Denies shortness of breath at rest or with exertion. Denies wheezing or cough.  GI: see HPI GU : Denies urinary burning, urinary frequency, urinary hesitancy MS: neck discomfort Derm: +psoriasis  Psych: Denies depression, anxiety, memory loss, and confusion Heme: Denies bruising, bleeding, and enlarged lymph nodes.  Physical Exam: BP 138/74  Pulse 90  Temp(Src) 97.2 F (36.2 C) (Oral)  Ht 5\' 3"  (1.6 m)  Wt 125 lb (56.7 kg)  BMI 22.15 kg/m2 General:   Alert and oriented. Pleasant and cooperative. Well-nourished and well-developed.  Head:  Normocephalic and atraumatic. Eyes:  Without icterus, sclera clear and conjunctiva pink.  Ears:  Normal auditory acuity. Nose:  No deformity, discharge,  or lesions. Mouth:  No deformity or lesions, oral mucosa pink.  Lungs:  Clear to auscultation bilaterally. No wheezes, rales, or rhonchi. No distress.  Heart:  S1, S2 present without  murmurs appreciated.  Abdomen:  +BS, soft, TTP upper abdomen/LUQ/RUQand non-distended. No HSM noted. No guarding or rebound. No masses appreciated.  Rectal:  Deferred  Msk:  Symmetrical without gross deformities. Normal posture. Extremities:  Without clubbing or edema. Neurologic:  Alert and  oriented  x4;  grossly normal neurologically. Skin:  Intact without significant lesions or rashes. Psych:  Alert and cooperative. Normal mood and affect.  Lab Results  Component Value Date   WBC 5.9 11/18/2013   HGB 9.1* 11/18/2013   HCT 28.3* 11/18/2013   MCV 88.7 11/18/2013   PLT 369 11/18/2013

## 2013-11-18 NOTE — Assessment & Plan Note (Addendum)
58 year old female with week-long history of intermittent hematemesis in the setting of Xarelto for afib; Xarelto has been held as of today, 10/29. Melena possibly last week but none recently. Hgb 9.1, appears to be around her baseline. Vitals stable today, does not appear acutely ill. No evidence of cirrhosis on last CT in May 2015. Discussed need for EGD as soon as possible; however, she will need to be performed in the OR with Propofol due to history of ETOH abuse and polypharmacy. I discussed signs/symptoms that would necessitate urgent ED evaluation to include worsening pain, tarry stool, or worsening hematemesis. In the interim, will increase PPI to BID, add Zofran, and continue to hold Xarelto. EGD to be planned ASAP with Dr. Oneida Alar with PROPOFOL. The risks and benefits were discussed in detail with stated understanding. Administrative assistance from group home present and also states understanding.

## 2013-11-18 NOTE — Pre-Procedure Instructions (Signed)
Spoke with WILLIAMSON, [place] regarding patient interview.  Information given to her regarding arrival date and time and medications to be given morning of procedure.  Verbalized understanding.

## 2013-11-18 NOTE — Progress Notes (Signed)
Patient ID: [name], female   DOB: [birth year], 58 y.o.   MRN: [id number]  PCP: N/A Primary Cardiologist: Dr. Percival Spanish  HPI: [name] is a 58 y/o female with a history of ETOH abuse, ICM, HTN, stroke and chronic systolic heart failure.  Admitted to [facility] 05/06/13 for pancreatitis. She had severely reduced systolic function with an EF of 20-25%. 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 Discharge weight was 140 pounds.   Admitted to [facility] 05/2013 with A fib RVR. Started on amiodarone 400 mg tiwce a day and Xarelto 20 mg daily. Chemically converted to NSR.  Admitted to [facility]  08/2013 wit chest pain. Troponin was elelvated. LHC was unchanged from April 2015 so she had CMRI that showed LAD territory was not viable. Plan to continue to treat medically.   Admitted 9/13 through 9/23 after attempted suicide. Overdosed on all HF medications. Discharged off carvedilol, lasix, lisinopril, xarelto and digoxin. Discharge weight 120 pounds. Discharged to [facility] which is a goup home.    Follow up for Heart Failure: Last visit carvedilol was added and imdur was increased to 60 mg daily. Overall she feels fair.  Having nausea/vomiting. Over the last week she has had hmeoptysis and says she is having golf ball sized clots of blood. Denies SOB/PND/Orthopnea. Dizzy in am. Having nausea. She has chest pain every now and then. Weight at home 120-123 pounds. She has had lasix about 2 times in the last 2 weeks.  Living at [facility] group home. Medications provided at [facility]. Smoking 3  cigarettes per day. No alcohol.  Limiting fluid intake and following low salt diet.   ECHO 09/02/13 EF 25-30%   Labs 05/15/13 K 3.7 Creatinine 0.97 Labs 06/02/13 K 3.9 Creatinine 1.0 Dig level 1.3 --> cut back dig to 0.0625 mg Labs 06/08/13 K 4.7 Creatinine 1.1 Dig level 0.4  Labs 06/19/13: K 4.1, creatinine 1.35 Labs 08/05/13: K 4.5,  creatinine 1.17, AST 25, ALT 28, cholesterol 188, TG 185, HDL 41, LDL 110 Labs 08/17/13: K 4.9 Creatinine 2.0  Labs 09/06/13: Dig level 0.9 K 4.5 Creatinine 1.13 Labs 10/13/13 K 5.1 Creatinine 2.1 10/20/13 K 4.1 Creatinine 1.32   SH: Lives with her husband. Unemployed. Stopped smoking and drinking alcohol April 2015.  FH: Father MI at the age of 42  ROS: All systems negative except as listed in HPI, PMH and Problem List.  Past Medical History  Diagnosis Date  . Diverticulosis   . Pancreatitis 1980, 2015    in the setting of ETOH  . Stroke 2011  . chronic systolic heart failure     a. ECHO (05/08/13): EF 20-25%, diff HK with akinesis of basal inferior wall, grade 1 DD, trivial MR, trivial TR b) RHC (05/06/13): RA 7, RV 30/2, PA 31/19 (24), PCWP 10, Fick CO/CI: 2.9/1.74, Thermo CO/Ci: 2.4/1.4, PA sat 53%  . Ischemic cardiomyopathy   . CAD (coronary artery disease)     a. LHC (04/2013): Chronically occluded LAD, moderate dz in large OM1 and severe dz and small posterior lateral vessel    . A-fib 2015  . Suicidal overdose 10/03/13    Overdoes on Beta Blockers & Xarelto [facility] ED)  . Anxiety 2015   . GERD (gastroesophageal reflux disease) 2015   . Hyperlipidemia 2015  . Hypertension 2015  . ETOH abuse 1981    started abusing alcohol at age 16   .  Psoriasis 1968    Current Outpatient Prescriptions  Medication Sig Dispense Refill  . acetaminophen (TYLENOL) 325 MG tablet Take 2 tablets (650 mg total) by mouth every 4 (four) hours as needed for headache or mild pain.      Marland Kitchen ammonium lactate (LAC-HYDRIN) 12 % cream Apply topically as needed for dry skin. Apply to elbows  385 g  1  . aspirin 81 MG chewable tablet Chew 1 tablet (81 mg total) by mouth daily.      Marland Kitchen atorvastatin (LIPITOR) 40 MG tablet Take 40 mg by mouth daily.      . carvedilol (COREG) 3.125 MG tablet Take 1 tablet (3.125 mg total) by mouth 2 (two) times daily with a meal. Hold for heart rate less than 60  60 tablet  6  . feeding  supplement, ENSURE, (ENSURE) PUDG Take 1 Container by mouth 2 (two) times daily between meals as needed.  150 g  2  . furosemide (LASIX) 20 MG tablet Take 1 tablet (20 mg total) by mouth as needed. Take 20 mg for weight 128 pounds or greater.  30 tablet  6  . hydrALAZINE (APRESOLINE) 25 MG tablet Take 0.5 tablets (12.5 mg total) by mouth 3 (three) times daily. Please check BP daily and record. Hold for SBP < 90  45 tablet  6  . isosorbide mononitrate (IMDUR) 30 MG 24 hr tablet Take 2 tablets (60 mg total) by mouth at bedtime.  60 tablet  6  . levothyroxine (SYNTHROID, LEVOTHROID) 25 MCG tablet Take 0.5 tablets (12.5 mcg total) by mouth daily before breakfast.  30 tablet  0  . LORazepam (ATIVAN) 0.5 MG tablet Take 0.5 mg by mouth 2 (two) times daily as needed for anxiety.      . nitroGLYCERIN (NITROSTAT) 0.3 MG SL tablet Place 0.3 mg under the tongue every 5 (five) minutes as needed for chest pain.      . pantoprazole (PROTONIX) 40 MG tablet Take 1 tablet (40 mg total) by mouth daily at 12 noon.      . rivaroxaban (XARELTO) 20 MG TABS tablet Take 1 tablet (20 mg total) by mouth daily with supper.  30 tablet  6  . sertraline (ZOLOFT) 25 MG tablet Take 1 tablet (25 mg total) by mouth daily.       No current facility-administered medications for this encounter.    Filed Vitals:   11/18/13 0856  BP: 142/88  Pulse: 106  Resp: 18  Weight: 126 lb (57.153 kg)  SpO2: 99%    PHYSICAL EXAM: General:  Fatigued appearing. No resp difficulty. Ambulated in the clinic with no difficulties. Caregiver present  HEENT: normal Neck: supple. JVP 5-6.  Carotids 2+ bilaterally; no bruits. No lymphadenopathy or thryomegaly appreciated. Cor: PMI normal. Regular rate & rhythm. No rubs, gallops or murmurs. Lungs: clear Abdomen: soft, nontender, nondistended. No hepatosplenomegaly. No bruits or masses. Good bowel sounds. Extremities: no cyanosis, clubbing, rash, edema Neuro: alert & orientedx3, cranial nerves  grossly intact. Moves all 4 extremities w/o difficulty. Affect pleasant.  EKG: NSR 81 bpm LBBB  ASSESSMENT & PLAN:  1. Chronic Systolic Heart Failure: Primarily ICM thought ETOH may play a role.  EF 25-30%  (08/2013) Cardiac MRI - August 2015 No viability noted LAD territory. LBBB noted 08/2013.  Admitted  10/03/13 -->  suicide attempt-  she  overdosed on her HF medications.  Now we are slowly adding back HF meds.   She remains at a [facility] and she receives all  medications from the staff.  Today she will continue on all HF meds until hemoptysis resolved.  - NYHA II symptoms and volume status stable.  Restart 20 mg lasix as needed for weight 128 or greater .  - Continue 3.125 mg carvedilol twice a day. Instructed to hold for HF < 60.  -Continue 12.5 mg hydralazine three times a day. -Hold off on Ace for now due to dizziness.  In the past she developed hyperkalemia on spironolactone.  -  Currently holding off on ICD. LBBB noted on EKG. Will need to be back on HF meds for 6 months and then plan to refer back to EP. - Reinforced the need and importance of daily weights, a low sodium diet, and fluid restriction (less than 2 L a day). Instructed to call the HF clinic if weight increases more than 3 lbs overnight or 5 lbs in a week.   Check BMET now  2. CAD: LHC with chronically occluded LAD, moderate disease in a large OM 2 and severe disease and small posterior lateral vessel. CMRI - no viability in LAD territory. Keep off ASA and stop Xarelto due to hemoptysis.  Continue  statin.  Continue  imdur to 60 mg daily at  bed time. Carvedilol restarted today.   3. Current smoker: Encouraged to stop smoking.  Declines smoking cessation.   4. Former Alcohol Abuse- Quit April 2015. .  5.  PAF- maintaining NSR. Off amiodarone since July. Stop Xarelto due to hemoptysis. .   6. Bradycardia- After BB overdose. That has resolved.  7. Suicide Attempt-  Recently hospitalized for suicide attempt --> took all HF  medications. She is currently on list for [facility] to start counseling. Continue daily Zoloft.  8. Hemoptysis- Noted for about 1 week. Stop Xarelto and aspirin. Check CBC stat. Check CXR now. Continue  protonix. 40 mg daily . Refer GI to urgent evaluation. Will likely need EGD.     Follow up 2 weeks.    Arcadia Gorgas NP-C 9:10 AM

## 2013-11-19 ENCOUNTER — Encounter (HOSPITAL_COMMUNITY)
Admission: RE | Admit: 2013-11-19 | Discharge: 2013-11-19 | Disposition: A | Discharge status: Home / Self Care | Payer: Medicaid Other | Source: Ambulatory Visit | Attending: Gastroenterology | Admitting: Gastroenterology

## 2013-11-19 NOTE — Pre-Procedure Instructions (Signed)
Hgb 9.1, which has been close to her baseline for several months.  Patient is having hematemesis, which is the dx for procedure.  Dr Patsey Berthold aware and is satisfied with proceeding with EGD.

## 2013-11-19 NOTE — Addendum Note (Signed)
Encounter addended by: Vanessa Barbara, CCT on: 11/19/2013 12:35 PM<BR>     Documentation filed: Charges VN

## 2013-11-21 NOTE — Progress Notes (Signed)
REVIEWED-pt needs to be consented for EGD/possible Givens capsule placement.

## 2013-11-22 ENCOUNTER — Encounter (HOSPITAL_COMMUNITY)
Admission: RE | Disposition: A | Discharge status: Home Health | Payer: Self-pay | Source: Ambulatory Visit | Attending: Gastroenterology

## 2013-11-22 ENCOUNTER — Ambulatory Visit (HOSPITAL_COMMUNITY): Payer: Medicaid Other | Admitting: Anesthesiology

## 2013-11-22 ENCOUNTER — Encounter (HOSPITAL_COMMUNITY): Payer: Self-pay | Admitting: *Deleted

## 2013-11-22 ENCOUNTER — Ambulatory Visit (HOSPITAL_COMMUNITY)
Admission: RE | Admit: 2013-11-22 | Discharge: 2013-11-22 | Disposition: A | Discharge status: Home Health | Payer: Medicaid Other | Source: Ambulatory Visit | Attending: Gastroenterology | Admitting: Gastroenterology

## 2013-11-22 DIAGNOSIS — I4891 Unspecified atrial fibrillation: Secondary | ICD-10-CM | POA: Insufficient documentation

## 2013-11-22 DIAGNOSIS — F17210 Nicotine dependence, cigarettes, uncomplicated: Secondary | ICD-10-CM | POA: Insufficient documentation

## 2013-11-22 DIAGNOSIS — K319 Disease of stomach and duodenum, unspecified: Secondary | ICD-10-CM | POA: Insufficient documentation

## 2013-11-22 DIAGNOSIS — I5022 Chronic systolic (congestive) heart failure: Secondary | ICD-10-CM | POA: Insufficient documentation

## 2013-11-22 DIAGNOSIS — I10 Essential (primary) hypertension: Secondary | ICD-10-CM | POA: Insufficient documentation

## 2013-11-22 DIAGNOSIS — K2980 Duodenitis without bleeding: Secondary | ICD-10-CM | POA: Insufficient documentation

## 2013-11-22 DIAGNOSIS — E785 Hyperlipidemia, unspecified: Secondary | ICD-10-CM | POA: Insufficient documentation

## 2013-11-22 DIAGNOSIS — F419 Anxiety disorder, unspecified: Secondary | ICD-10-CM | POA: Diagnosis not present

## 2013-11-22 DIAGNOSIS — K2970 Gastritis, unspecified, without bleeding: Secondary | ICD-10-CM | POA: Diagnosis not present

## 2013-11-22 DIAGNOSIS — K920 Hematemesis: Secondary | ICD-10-CM | POA: Insufficient documentation

## 2013-11-22 DIAGNOSIS — Z9049 Acquired absence of other specified parts of digestive tract: Secondary | ICD-10-CM | POA: Diagnosis not present

## 2013-11-22 DIAGNOSIS — K921 Melena: Secondary | ICD-10-CM | POA: Diagnosis not present

## 2013-11-22 DIAGNOSIS — R110 Nausea: Secondary | ICD-10-CM

## 2013-11-22 DIAGNOSIS — F1010 Alcohol abuse, uncomplicated: Secondary | ICD-10-CM | POA: Insufficient documentation

## 2013-11-22 HISTORY — PX: ESOPHAGOGASTRODUODENOSCOPY (EGD) WITH PROPOFOL: SHX5813

## 2013-11-22 HISTORY — PX: BIOPSY: SHX5522

## 2013-11-22 SURGERY — ESOPHAGOGASTRODUODENOSCOPY (EGD) WITH PROPOFOL
Anesthesia: Monitor Anesthesia Care

## 2013-11-22 MED ORDER — ONDANSETRON HCL 4 MG/2ML IJ SOLN
4.0000 mg | Freq: Once | INTRAMUSCULAR | Status: AC
  Administered 2013-11-22: 4 mg via INTRAVENOUS

## 2013-11-22 MED ORDER — MIDAZOLAM HCL 2 MG/2ML IJ SOLN
INTRAMUSCULAR | Status: AC
  Filled 2013-11-22: qty 2

## 2013-11-22 MED ORDER — ONDANSETRON HCL 4 MG/2ML IJ SOLN: 4.0000 mg | Freq: Once | INTRAMUSCULAR | Status: DC | PRN

## 2013-11-22 MED ORDER — FENTANYL CITRATE 0.05 MG/ML IJ SOLN
25.0000 ug | INTRAMUSCULAR | Status: AC
  Administered 2013-11-22 (×2): 25 ug via INTRAVENOUS

## 2013-11-22 MED ORDER — FENTANYL CITRATE 0.05 MG/ML IJ SOLN: 25.0000 ug | INTRAMUSCULAR | Status: DC | PRN

## 2013-11-22 MED ORDER — MIDAZOLAM HCL 2 MG/2ML IJ SOLN
1.0000 mg | INTRAMUSCULAR | Status: DC | PRN
  Administered 2013-11-22: 2 mg via INTRAVENOUS

## 2013-11-22 MED ORDER — STERILE WATER FOR IRRIGATION IR SOLN
Status: DC | PRN
  Administered 2013-11-22: 15 mL

## 2013-11-22 MED ORDER — PROPOFOL INFUSION 10 MG/ML OPTIME
INTRAVENOUS | Status: DC | PRN
  Administered 2013-11-22: 100 ug/kg/min via INTRAVENOUS

## 2013-11-22 MED ORDER — LIDOCAINE VISCOUS 2 % MT SOLN
2.0000 mL | Freq: Once | OROMUCOSAL | Status: AC
  Administered 2013-11-22: 2 mL via OROMUCOSAL
  Filled 2013-11-22: qty 5

## 2013-11-22 MED ORDER — LIDOCAINE HCL (PF) 1 % IJ SOLN
INTRAMUSCULAR | Status: AC
  Filled 2013-11-22: qty 5

## 2013-11-22 MED ORDER — ONDANSETRON HCL 4 MG/2ML IJ SOLN
INTRAMUSCULAR | Status: AC
  Filled 2013-11-22: qty 2

## 2013-11-22 MED ORDER — LIDOCAINE VISCOUS 2 % MT SOLN
OROMUCOSAL | Status: DC | PRN
  Administered 2013-11-22: 20 mL via OROMUCOSAL

## 2013-11-22 MED ORDER — LIDOCAINE VISCOUS 2 % MT SOLN
OROMUCOSAL | Status: AC
  Filled 2013-11-22: qty 15

## 2013-11-22 MED ORDER — PROPOFOL 10 MG/ML IV BOLUS
INTRAVENOUS | Status: AC
  Filled 2013-11-22: qty 20

## 2013-11-22 MED ORDER — LACTATED RINGERS IV SOLN
INTRAVENOUS | Status: DC
  Administered 2013-11-22: 1000 mL via INTRAVENOUS

## 2013-11-22 MED ORDER — FENTANYL CITRATE 0.05 MG/ML IJ SOLN
INTRAMUSCULAR | Status: AC
  Filled 2013-11-22: qty 2

## 2013-11-22 SURGICAL SUPPLY — 18 items
BLOCK BITE 60FR ADLT L/F BLUE (MISCELLANEOUS) ×2 IMPLANT
ELECT REM PT RETURN 9FT ADLT (ELECTROSURGICAL)
ELECTRODE REM PT RTRN 9FT ADLT (ELECTROSURGICAL) IMPLANT
FLOOR PAD 36X40 (MISCELLANEOUS) ×2
FORCEPS BIOP RAD 4 LRG CAP 4 (CUTTING FORCEPS) ×2 IMPLANT
FORMALIN 10 PREFIL 20ML (MISCELLANEOUS) ×2 IMPLANT
KIT CLEAN ENDO COMPLIANCE (KITS) IMPLANT
MANIFOLD NEPTUNE II (INSTRUMENTS) ×2 IMPLANT
NEEDLE SCLEROTHERAPY 25GX240 (NEEDLE) IMPLANT
PAD FLOOR 36X40 (MISCELLANEOUS) ×1 IMPLANT
PROBE APC STR FIRE (PROBE) IMPLANT
PROBE INJECTION GOLD (MISCELLANEOUS)
PROBE INJECTION GOLD 7FR (MISCELLANEOUS) IMPLANT
SNARE SHORT THROW 13M SML OVAL (MISCELLANEOUS) IMPLANT
SYR 50ML LL SCALE MARK (SYRINGE) ×2 IMPLANT
SYR INFLATION 60ML (SYRINGE) IMPLANT
TUBING IRRIGATION ENDOGATOR (MISCELLANEOUS) IMPLANT
WATER STERILE IRR 1000ML POUR (IV SOLUTION) ×2 IMPLANT

## 2013-11-22 NOTE — H&P (Signed)
Primary Care Physician:  [name], MD Primary Gastroenterologist:  Dr. Oneida Alar  Pre-Procedure History & Physical: HPI:  [name] is a 58 y.o. female here for HEMATEMESIS/?MELENA.  Past Medical History  Diagnosis Date  . Diverticulosis   . Pancreatitis 1980, 2015    in the setting of ETOH  . Stroke 2011  . chronic systolic heart failure     a. ECHO (05/08/13): EF 20-25%, diff HK with akinesis of basal inferior wall, grade 1 DD, trivial MR, trivial TR b) RHC (05/06/13): RA 7, RV 30/2, PA 31/19 (24), PCWP 10, Fick CO/CI: 2.9/1.74, Thermo CO/Ci: 2.4/1.4, PA sat 53%  . Ischemic cardiomyopathy   . CAD (coronary artery disease)     a. LHC (04/2013): Chronically occluded LAD, moderate dz in large OM1 and severe dz and small posterior lateral vessel    . A-fib 2015  . Suicidal overdose 10/03/13    Overdoes on Beta Blockers & Xarelto [facility] ED)  . Anxiety 2015   . GERD (gastroesophageal reflux disease) 2015   . Hyperlipidemia 2015  . Hypertension 2015  . ETOH abuse 1981    started abusing alcohol at age 66   . Psoriasis 1968    Past Surgical History  Procedure Laterality Date  . Colostomy takedown  2007  . Exploratory laparotomy with colon resection, colostomy  2007    Prior to Admission medications   Medication Sig Start Date End Date Taking? Authorizing Provider  ammonium lactate (LAC-HYDRIN) 12 % cream Apply topically as needed for dry skin. Apply to elbows 10/22/13  Yes Josalyn C Funches, MD  atorvastatin (LIPITOR) 40 MG tablet Take 40 mg by mouth daily.   Yes Historical Provider, MD  carvedilol (COREG) 3.125 MG tablet Take 1 tablet (3.125 mg total) by mouth 2 (two) times daily with a meal. Hold for heart rate less than 60 11/03/13  Yes Amy D Clegg, NP  furosemide (LASIX) 20 MG tablet Take 1 tablet (20 mg total) by mouth as needed. Take 20 mg for weight 128 pounds or greater. 10/20/13  Yes Amy D Clegg, NP  hydrALAZINE (APRESOLINE) 25 MG tablet Take 0.5 tablets (12.5 mg  total) by mouth 3 (three) times daily. Please check BP daily and record. Hold for SBP < 90 10/20/13  Yes Amy D Clegg, NP  isosorbide mononitrate (IMDUR) 30 MG 24 hr tablet Take 2 tablets (60 mg total) by mouth at bedtime. 11/03/13  Yes Amy D Clegg, NP  levothyroxine (SYNTHROID, LEVOTHROID) 25 MCG tablet Take 0.125 mcg by mouth daily before breakfast.   Yes Historical Provider, MD  LORazepam (ATIVAN) 0.5 MG tablet Take 0.5 mg by mouth 2 (two) times daily as needed for anxiety.   Yes Historical Provider, MD  ondansetron (ZOFRAN) 4 MG tablet Take 1 tablet (4 mg total) by mouth every 8 (eight) hours as needed for nausea or vomiting. 11/18/13  Yes Orvil Feil, NP  pantoprazole (PROTONIX) 40 MG tablet Take 1 tablet (40 mg total) by mouth 2 (two) times daily before a meal. 11/18/13  Yes Orvil Feil, NP  sertraline (ZOLOFT) 25 MG tablet Take 1 tablet (25 mg total) by mouth daily. 10/13/13  Yes Barton Dubois, MD  acetaminophen (TYLENOL) 325 MG tablet Take 650 mg by mouth every 6 (six) hours as needed for fever or headache.    Historical Provider, MD  nitroGLYCERIN (NITROSTAT) 0.3 MG SL tablet Place 0.3 mg under the tongue every 5 (five) minutes as needed for chest pain.    Historical  Provider, MD    Allergies as of 11/18/2013 - Review Complete 11/18/2013  Allergen Reaction Noted  . Morphine and related Other (See Comments) 09/24/2013  . Penicillins Anaphylaxis and Rash 05/06/2013    Family History  Problem Relation Age of Onset  . CAD Father 83    MI  . CAD Mother 80    MI  . Alcohol abuse Mother   . CAD Sister 12    Stent  . Diabetes Sister   . CAD Brother 67    MI  . Cancer Neg Hx   . Colon cancer Mother     in her 73s    History   Social History  . Marital Status: Married    Spouse Name: N/A    Number of Children: 2  . Years of Education: 10   Occupational History  . Unemployed     Social History Main Topics  . Smoking status: Current Some Day Smoker -- 0.25 packs/day for 47  years    Types: Cigarettes  . Smokeless tobacco: Never Used     Comment: she smokes occasionally, trying to quit  . Alcohol Use: No     Comment: last drink in 03/2013   . Drug Use: No  . Sexual Activity: No   Other Topics Concern  . Not on file   Social History Narrative   Lived previously with her spouse in an [place].     Son and daughter   Daughter in [place]   Son in [place]   Two grandchildren.       Presently in an assisted living facility [place], moved in 10/13/13.           Review of Systems: See HPI, otherwise negative ROS   Physical Exam: BP 147/87 mmHg  Pulse 80  Temp(Src) 97.3 F (36.3 C) (Oral)  Resp 43  Ht 5\' 3"  (1.6 m)  Wt 125 lb (56.7 kg)  BMI 22.15 kg/m2  SpO2 100% General:   Alert,  pleasant and cooperative in NAD Head:  Normocephalic and atraumatic. Neck:  Supple; Lungs:  Clear throughout to auscultation.    Heart:  Regular rate and rhythm. Abdomen:  Soft, nontender and nondistended. Normal bowel sounds, without guarding, and without rebound.   Neurologic:  Alert and  oriented x4;  grossly normal neurologically.  Impression/Plan:    HEMATEMESIS/?MELENA  PLAN:  EGD TODAY

## 2013-11-22 NOTE — Anesthesia Postprocedure Evaluation (Signed)
  Anesthesia Post-op Note  Patient: [name]  Procedure(s) Performed: Procedure(s): ESOPHAGOGASTRODUODENOSCOPY (EGD) WITH PROPOFOL (N/A) GASTRIC BIOPSIES (N/A)  Patient Location: PACU and Short Stay  Anesthesia Type:MAC  Level of Consciousness: awake, alert  and oriented  Airway and Oxygen Therapy: Patient Spontanous Breathing  Post-op Pain: none  Post-op Assessment: Post-op Vital signs reviewed, Patient's Cardiovascular Status Stable, Respiratory Function Stable, Patent Airway and No signs of Nausea or vomiting  Post-op Vital Signs: Reviewed and stable  Last Vitals:  Filed Vitals:   11/22/13 1310  BP: 108/49  Pulse: 60  Temp: 36.4 C  Resp:     Complications: No apparent anesthesia complications

## 2013-11-22 NOTE — Discharge Instructions (Signed)
You have gastritis & DUODENITIS WHICH CAN CAUSE BLOOD TO BE IN YOUR VOMIT. I biopsied your stomach.   CONTINUE PROTONIX. TAKE 30 MINUTES PRIOR TO MEALS TWICE DAILY.  STRICTLY FOLLOW A LOW FAT DIET. SEE INFO BELOW.  YOUR BIOPSY WILL BE BACK IN 14 DAYS.  FOLLOW UP IN FEB 2016.   UPPER ENDOSCOPY AFTER CARE Read the instructions outlined below and refer to this sheet in the next week. These discharge instructions provide you with general information on caring for yourself after you leave the hospital. While your treatment has been planned according to the most current medical practices available, unavoidable complications occasionally occur. If you have any problems or questions after discharge, call DR. Liylah Najarro, [phone].  ACTIVITY  You may resume your regular activity, but move at a slower pace for the next 24 hours.   Take frequent rest periods for the next 24 hours.   Walking will help get rid of the air and reduce the bloated feeling in your belly (abdomen).   No driving for 24 hours (because of the medicine (anesthesia) used during the test).   You may shower.   Do not sign any important legal documents or operate any machinery for 24 hours (because of the anesthesia used during the test).    NUTRITION  Drink plenty of fluids.   You may resume your normal diet as instructed by your doctor.   Begin with a light meal and progress to your normal diet. Heavy or fried foods are harder to digest and may make you feel sick to your stomach (nauseated).   Avoid alcoholic beverages for 24 hours or as instructed.    MEDICATIONS  You may resume your normal medications.   WHAT YOU CAN EXPECT TODAY  Some feelings of bloating in the abdomen.   Passage of more gas than usual.    IF YOU HAD A BIOPSY TAKEN DURING THE UPPER ENDOSCOPY:  Eat a soft diet IF YOU HAVE NAUSEA, BLOATING, ABDOMINAL PAIN, OR VOMITING.    FINDING OUT THE RESULTS OF YOUR TEST Not all test results  are available during your visit. DR. Oneida Alar WILL CALL YOU WITHIN 14 DAYS OF YOUR PROCEDUE WITH YOUR RESULTS. Do not assume everything is normal if you have not heard from DR. Keyaan Lederman IN 2 WEEKS, CALL HER OFFICE AT [phone].  SEEK IMMEDIATE MEDICAL ATTENTION AND CALL THE OFFICE: [phone] IF:  You have more than a spotting of blood in your stool.   Your belly is swollen (abdominal distention).   You are nauseated or vomiting.   You have a temperature over 101F.   You have abdominal pain or discomfort that is severe or gets worse throughout the day.   Gastritis/DUODENITIS  Gastritis is an inflammation (the body's way of reacting to injury and/or infection) of the stomach. DUODENITIS is an inflammation (the body's way of reacting to injury and/or infection) of the FIRST PART OF THE SMALL INTESTINES. It is often caused by bacterial (germ) infections. It can also be caused BY ASPIRIN, BC/GOODY POWDER'S, (IBUPROFEN) MOTRIN, OR ALEVE (NAPROXEN), chemicals (including alcohol), SPICY FOODS, and medications. This illness may be associated with generalized malaise (feeling tired, not well), UPPER ABDOMINAL STOMACH cramps, and fever. One common bacterial cause of gastritis is an organism known as H. Pylori. This can be treated with antibiotics.     REFLUX   TREATMENT There are a number of non-prescription medicines used to treat reflux including: Antacids.  ZANTAC Proton-pump inhibitors: [place]  INSTRUCTIONS Eat 2-3 hours before going to bed.  Try to reach and maintain a healthy weight. LOSE 10-20 LBS Do not eat just a few very large meals. Instead, eat 4 TO 6 smaller meals throughout the day.  Try to identify foods and beverages that make your symptoms worse, and avoid these.  Avoid tight clothing.  Do not exercise right after eating.   Low-Fat Diet BREADS, CEREALS, PASTA, RICE, DRIED PEAS, AND BEANS These products are high in carbohydrates and most are low  in fat. Therefore, they can be increased in the diet as substitutes for fatty foods. They too, however, contain calories and should not be eaten in excess. Cereals can be eaten for snacks as well as for breakfast.  Include foods that contain fiber (fruits, vegetables, whole grains, and legumes). Research shows that fiber may lower blood cholesterol levels, especially the water-soluble fiber found in fruits, vegetables, oat products, and legumes. FRUITS AND VEGETABLES It is good to eat fruits and vegetables. Besides being sources of fiber, both are rich in vitamins and some minerals. They help you get the daily allowances of these nutrients. Fruits and vegetables can be used for snacks and desserts. MEATS Limit lean meat, chicken, [place], and fish to no more than 6 ounces per day. Beef, Pork, and Lamb Use lean cuts of beef, pork, and lamb. Lean cuts include:  Extra-lean ground beef.  Arm roast.  Sirloin tip.  Center-cut ham.  Round steak.  Loin chops.  Rump roast.  Tenderloin.  Trim all fat off the outside of meats before cooking. It is not necessary to severely decrease the intake of red meat, but lean choices should be made. Lean meat is rich in protein and contains a highly absorbable form of iron. Premenopausal women, in particular, should avoid reducing lean red meat because this could increase the risk for low red blood cells (iron-deficiency anemia).  Chicken and [place] These are good sources of protein. The fat of poultry can be reduced by removing the skin and underlying fat layers before cooking. Chicken and [place] can be substituted for lean red meat in the diet. Poultry should not be fried or covered with high-fat sauces. Fish and Shellfish Fish is a good source of protein. Shellfish contain cholesterol, but they usually are low in saturated fatty acids. The preparation of fish is important. Like chicken and [place], they should not be fried or covered with high-fat sauces. EGGS Egg  whites contain no fat or cholesterol. They can be eaten often. Try 1 to 2 egg whites instead of whole eggs in recipes or use egg substitutes that do not contain yolk.  MILK AND DAIRY PRODUCTS Use skim or 1% milk instead of 2% or whole milk. Decrease whole milk, natural, and processed cheeses. Use nonfat or low-fat (2%) cottage cheese or low-fat cheeses made from vegetable oils. Choose nonfat or low-fat (1 to 2%) yogurt. Experiment with evaporated skim milk in recipes that call for heavy cream. Substitute low-fat yogurt or low-fat cottage cheese for sour cream in dips and salad dressings. Have at least 2 servings of low-fat dairy products, such as 2 glasses of skim (or 1%) milk each day to help get your daily calcium intake.  FATS AND OILS Butterfat, lard, and beef fats are high in saturated fat and cholesterol. These should be avoided.Vegetable fats do not contain cholesterol. AVOID coconut oil, palm oil, and palm kernel oil, WHICH are very high in saturated fats. These should be limited. These fats are often used  in bakery goods, processed foods, popcorn, oils, and nondairy creamers. Vegetable shortenings and some peanut butters contain hydrogenated oils, which are also saturated fats. Read the labels on these foods and check for saturated vegetable oils.  Desirable liquid vegetable oils are corn oil, cottonseed oil, olive oil, canola oil, safflower oil, soybean oil, and sunflower oil. Peanut oil is not as good, but small amounts are acceptable. Buy a heart-healthy tub margarine that has no partially hydrogenated oils in the ingredients. AVOID Mayonnaise and salad dressings often are made from unsaturated fats.  OTHER EATING TIPS Snacks  Most sweets should be limited as snacks. They tend to be rich in calories and fats, and their caloric content outweighs their nutritional value. Some good choices in snacks are graham crackers, melba toast, soda crackers, bagels (no egg), English muffins, fruits, and  vegetables. These snacks are preferable to snack crackers, [place] fries, and chips. Popcorn should be air-popped or cooked in small amounts of liquid vegetable oil.  Desserts Eat fruit, low-fat yogurt, and fruit ices instead of pastries, cake, and cookies. Sherbet, angel food cake, gelatin dessert, frozen low-fat yogurt, or other frozen products that do not contain saturated fat (pure fruit juice bars, frozen ice pops) are also acceptable.   COOKING METHODS Choose those methods that use little or no fat. They include: Poaching.  Braising.  Steaming.  Grilling.  Baking.  Stir-frying.  Broiling.  Microwaving.  Foods can be cooked in a nonstick pan without added fat, or use a nonfat cooking spray in regular cookware. Limit fried foods and avoid frying in saturated fat. Add moisture to lean meats by using water, broth, cooking wines, and other nonfat or low-fat sauces along with the cooking methods mentioned above. Soups and stews should be chilled after cooking. The fat that forms on top after a few hours in the refrigerator should be skimmed off. When preparing meals, avoid using excess salt. Salt can contribute to raising blood pressure in some people.  EATING AWAY FROM HOME Order entres, potatoes, and vegetables without sauces or butter. When meat exceeds the size of a deck of cards (3 to 4 ounces), the rest can be taken home for another meal. Choose vegetable or fruit salads and ask for low-calorie salad dressings to be served on the side. Use dressings sparingly. Limit high-fat toppings, such as bacon, crumbled eggs, cheese, sunflower seeds, and olives. Ask for heart-healthy tub margarine instead of butter.

## 2013-11-22 NOTE — Op Note (Signed)
[facility] [address] [place], [address]   ENDOSCOPY PROCEDURE REPORT  PATIENT: [name], [name]  MR#: #[id number] BIRTHDATE: 01/13/1956 , 3  yrs. old GENDER: female  ENDOSCOPIST: Barney Drain, MD REFERRED BY:   Cecille Po, M.D.  PROCEDURE DATE: 17-Dec-2013 PROCEDURE:   EGD w/ biopsy  INDICATIONS:hematemesis.   melena. MEDICATIONS: Monitored anesthesia care TOPICAL ANESTHETIC:   Viscous Xylocaine ASA CLASS:  DESCRIPTION OF PROCEDURE:     Physical exam was performed.  Informed consent was obtained from the patient after explaining the benefits, risks, and alternatives to the procedure.  The patient was connected to the monitor and placed in the left lateral position.  Continuous oxygen was provided by nasal cannula and IV medicine administered through an indwelling cannula.  After administration of sedation, the patients esophagus was intubated and the     endoscope was advanced under direct visualization to the second portion of the duodenum.  The scope was removed slowly by carefully examining the color, texture, anatomy, and integrity of the mucosa on the way out.  The patient was recovered in endoscopy and discharged home in satisfactory condition.   ESOPHAGUS: The mucosa of the esophagus appeared normal.   STOMACH: Mild erosive gastritis (inflammation) was found in the gastric antrum.  Multiple biopsies were performed using cold forceps. DUODENUM: Mild duodenal inflammation was found in the duodenal bulb.   The duodenal mucosa showed no abnormalities in the 2nd part of the duodenum.  COMPLICATIONS: There were no immediate complications.  ENDOSCOPIC IMPRESSION: 1.  HEMATEMESIS MAY BE DUE TO GASTRITYIS/DUODENITIS 2.   NO SOURCE FOR MELENA IDENTIFIED  RECOMMENDATIONS: [place].  TAKE 30 MINUTES PRIOR TO MEALS TWICE DAILY. STRICTLY FOLLOW A LOW FAT DIET. BIOPSY WILL BE BACK IN 14 DAYS. CONSIDER GIVENS CAPSULE ENDOSCOPY. FOLLOW UP IN  FEB 2016.  REPEAT EXAM: _______________________________ eSignedBarney Drain, MD 12/17/13 2:33 PM     CPT CODES: ICD CODES:  The ICD and CPT codes recommended by this software are interpretations from the data that the clinical staff has captured with the software.  The verification of the translation of this report to the ICD and CPT codes and modifiers is the sole responsibility of the health care institution and practicing physician where this report was generated.  [employer] will not be held responsible for the validity of the ICD and CPT codes included on this report.  AMA assumes no liability for data contained or not contained herein. [employer] is a Designer, television/film set of the [employer].

## 2013-11-22 NOTE — Anesthesia Preprocedure Evaluation (Addendum)
Anesthesia Evaluation  Patient identified by MRN, date of birth, ID band Patient awake    Reviewed: Allergy & Precautions, H&P , NPO status , Patient's Chart, lab work & pertinent test results, reviewed documented beta blocker date and time   Airway Mallampati: II  TM Distance: >3 FB     Dental  (+) Edentulous Upper   Pulmonary Current Smoker,  breath sounds clear to auscultation        Cardiovascular hypertension, Pt. on medications and Pt. on home beta blockers + CAD and +CHF + dysrhythmias Atrial Fibrillation Rhythm:Regular Rate:Normal     Neuro/Psych PSYCHIATRIC DISORDERS Anxiety CVA    GI/Hepatic GERD-  Medicated,(+)     substance abuse  alcohol use,   Endo/Other    Renal/GU Renal disease     Musculoskeletal   Abdominal   Peds  Hematology   Anesthesia Other Findings   Reproductive/Obstetrics                            Anesthesia Physical Anesthesia Plan  ASA: III  Anesthesia Plan: MAC   Post-op Pain Management:    Induction: Intravenous  Airway Management Planned: Simple Face Mask  Additional Equipment:   Intra-op Plan:   Post-operative Plan:   Informed Consent: I have reviewed the patients History and Physical, chart, labs and discussed the procedure including the risks, benefits and alternatives for the proposed anesthesia with the patient or authorized representative who has indicated his/her understanding and acceptance.     Plan Discussed with:   Anesthesia Plan Comments:         Anesthesia Quick Evaluation

## 2013-11-22 NOTE — Transfer of Care (Signed)
Immediate Anesthesia Transfer of Care Note  Patient: [name]  Procedure(s) Performed: Procedure(s): ESOPHAGOGASTRODUODENOSCOPY (EGD) WITH PROPOFOL (N/A) GASTRIC BIOPSIES (N/A)  Patient Location: PACU  Anesthesia Type:MAC  Level of Consciousness: awake, alert  and oriented  Airway & Oxygen Therapy: Patient Spontanous Breathing  Post-op Assessment: Report given to PACU RN  Post vital signs: Reviewed and stable  Complications: No apparent anesthesia complications

## 2013-11-23 ENCOUNTER — Encounter (HOSPITAL_COMMUNITY): Payer: Self-pay | Admitting: Gastroenterology

## 2013-11-29 ENCOUNTER — Encounter (HOSPITAL_COMMUNITY): Payer: Self-pay

## 2013-12-22 ENCOUNTER — Ambulatory Visit: Payer: Self-pay | Admitting: Family Medicine

## 2013-12-30 ENCOUNTER — Ambulatory Visit: Payer: Medicaid Other | Attending: Family Medicine | Admitting: Family Medicine

## 2013-12-30 ENCOUNTER — Encounter: Payer: Self-pay | Admitting: Family Medicine

## 2013-12-30 ENCOUNTER — Other Ambulatory Visit (HOSPITAL_COMMUNITY)
Admission: RE | Admit: 2013-12-30 | Discharge: 2013-12-30 | Disposition: A | Discharge status: Home / Self Care | Payer: Medicaid Other | Source: Ambulatory Visit | Attending: Family Medicine | Admitting: Family Medicine

## 2013-12-30 ENCOUNTER — Ambulatory Visit (HOSPITAL_COMMUNITY)
Admission: RE | Admit: 2013-12-30 | Discharge: 2013-12-30 | Disposition: A | Discharge status: Home / Self Care | Payer: Medicaid Other | Source: Ambulatory Visit | Attending: Internal Medicine | Admitting: Internal Medicine

## 2013-12-30 ENCOUNTER — Encounter: Payer: Self-pay | Admitting: Licensed Clinical Social Worker

## 2013-12-30 VITALS — BP 140/90 | HR 100 | Wt 123.0 lb

## 2013-12-30 VITALS — BP 108/62 | HR 65 | Temp 98.5°F | Resp 16 | Ht 63.0 in | Wt 124.0 lb

## 2013-12-30 DIAGNOSIS — K921 Melena: Secondary | ICD-10-CM | POA: Diagnosis not present

## 2013-12-30 DIAGNOSIS — Z1151 Encounter for screening for human papillomavirus (HPV): Secondary | ICD-10-CM | POA: Insufficient documentation

## 2013-12-30 DIAGNOSIS — Z915 Personal history of self-harm: Secondary | ICD-10-CM | POA: Insufficient documentation

## 2013-12-30 DIAGNOSIS — I2510 Atherosclerotic heart disease of native coronary artery without angina pectoris: Secondary | ICD-10-CM | POA: Insufficient documentation

## 2013-12-30 DIAGNOSIS — F1021 Alcohol dependence, in remission: Secondary | ICD-10-CM | POA: Diagnosis not present

## 2013-12-30 DIAGNOSIS — R042 Hemoptysis: Secondary | ICD-10-CM | POA: Insufficient documentation

## 2013-12-30 DIAGNOSIS — I5022 Chronic systolic (congestive) heart failure: Secondary | ICD-10-CM | POA: Insufficient documentation

## 2013-12-30 DIAGNOSIS — F1010 Alcohol abuse, uncomplicated: Secondary | ICD-10-CM

## 2013-12-30 DIAGNOSIS — Z8673 Personal history of transient ischemic attack (TIA), and cerebral infarction without residual deficits: Secondary | ICD-10-CM | POA: Insufficient documentation

## 2013-12-30 DIAGNOSIS — R42 Dizziness and giddiness: Secondary | ICD-10-CM | POA: Insufficient documentation

## 2013-12-30 DIAGNOSIS — N760 Acute vaginitis: Secondary | ICD-10-CM | POA: Diagnosis present

## 2013-12-30 DIAGNOSIS — I480 Paroxysmal atrial fibrillation: Secondary | ICD-10-CM | POA: Diagnosis not present

## 2013-12-30 DIAGNOSIS — Z113 Encounter for screening for infections with a predominantly sexual mode of transmission: Secondary | ICD-10-CM | POA: Diagnosis not present

## 2013-12-30 DIAGNOSIS — R001 Bradycardia, unspecified: Secondary | ICD-10-CM | POA: Insufficient documentation

## 2013-12-30 DIAGNOSIS — F419 Anxiety disorder, unspecified: Secondary | ICD-10-CM | POA: Insufficient documentation

## 2013-12-30 DIAGNOSIS — I447 Left bundle-branch block, unspecified: Secondary | ICD-10-CM | POA: Diagnosis not present

## 2013-12-30 DIAGNOSIS — I10 Essential (primary) hypertension: Secondary | ICD-10-CM | POA: Insufficient documentation

## 2013-12-30 DIAGNOSIS — Z0000 Encounter for general adult medical examination without abnormal findings: Secondary | ICD-10-CM | POA: Insufficient documentation

## 2013-12-30 DIAGNOSIS — Z124 Encounter for screening for malignant neoplasm of cervix: Secondary | ICD-10-CM | POA: Insufficient documentation

## 2013-12-30 DIAGNOSIS — E875 Hyperkalemia: Secondary | ICD-10-CM | POA: Diagnosis not present

## 2013-12-30 DIAGNOSIS — Z720 Tobacco use: Secondary | ICD-10-CM

## 2013-12-30 DIAGNOSIS — Z87891 Personal history of nicotine dependence: Secondary | ICD-10-CM | POA: Insufficient documentation

## 2013-12-30 DIAGNOSIS — Z01419 Encounter for gynecological examination (general) (routine) without abnormal findings: Secondary | ICD-10-CM | POA: Insufficient documentation

## 2013-12-30 DIAGNOSIS — K920 Hematemesis: Secondary | ICD-10-CM

## 2013-12-30 DIAGNOSIS — F411 Generalized anxiety disorder: Secondary | ICD-10-CM | POA: Diagnosis not present

## 2013-12-30 DIAGNOSIS — E039 Hypothyroidism, unspecified: Secondary | ICD-10-CM | POA: Diagnosis not present

## 2013-12-30 DIAGNOSIS — T447X2D Poisoning by beta-adrenoreceptor antagonists, intentional self-harm, subsequent encounter: Secondary | ICD-10-CM

## 2013-12-30 DIAGNOSIS — F1011 Alcohol abuse, in remission: Secondary | ICD-10-CM

## 2013-12-30 DIAGNOSIS — R110 Nausea: Secondary | ICD-10-CM

## 2013-12-30 LAB — CBC
HEMATOCRIT: 30.5 % — AB (ref 36.0–46.0)
Hemoglobin: 9.7 g/dL — ABNORMAL LOW (ref 12.0–15.0)
MCH: 28.1 pg (ref 26.0–34.0)
MCHC: 31.8 g/dL (ref 30.0–36.0)
MCV: 88.4 fL (ref 78.0–100.0)
Platelets: 254 10*3/uL (ref 150–400)
RBC: 3.45 MIL/uL — ABNORMAL LOW (ref 3.87–5.11)
RDW: 13.2 % (ref 11.5–15.5)
WBC: 4.9 10*3/uL (ref 4.0–10.5)

## 2013-12-30 LAB — BASIC METABOLIC PANEL
Anion gap: 15 (ref 5–15)
BUN: 14 mg/dL (ref 6–23)
CALCIUM: 9.4 mg/dL (ref 8.4–10.5)
CHLORIDE: 105 meq/L (ref 96–112)
CO2: 23 mEq/L (ref 19–32)
CREATININE: 1.04 mg/dL (ref 0.50–1.10)
GFR calc non Af Amer: 58 mL/min — ABNORMAL LOW (ref >90)
GFR, EST AFRICAN AMERICAN: 67 mL/min — AB (ref >90)
Glucose, Bld: 88 mg/dL (ref 70–99)
Potassium: 3.9 mEq/L (ref 3.7–5.3)
Sodium: 143 mEq/L (ref 137–147)

## 2013-12-30 LAB — TSH: TSH: 3.747 u[IU]/mL (ref 0.350–4.500)

## 2013-12-30 MED ORDER — CARVEDILOL 6.25 MG PO TABS: 6.2500 mg | ORAL_TABLET | Freq: Two times a day (BID) | ORAL | Status: DC

## 2013-12-30 MED ORDER — APIXABAN 5 MG PO TABS
5.0000 mg | ORAL_TABLET | Freq: Two times a day (BID) | ORAL | Status: DC
Start: 2013-12-30 — End: 2015-02-24

## 2013-12-30 MED ORDER — LORAZEPAM 0.5 MG PO TABS: 0.5000 mg | ORAL_TABLET | Freq: Two times a day (BID) | ORAL | Status: DC

## 2013-12-30 MED ORDER — SERTRALINE HCL 50 MG PO TABS: 50.0000 mg | ORAL_TABLET | Freq: Every day | ORAL | Status: DC

## 2013-12-30 NOTE — Progress Notes (Signed)
   Subjective:    Patient ID: [name], female    DOB: October 08, 1955, 58 y.o.   MRN: [id number] CC; physical with pap  HPI 58 yo F presents for f/u visit:  1. Anxiety: increased anxiety in afternoons around noon. Taking ativan twice daily. Taking zoloft 25 mg daily.    2. Elevated pulse: checking her BP and pulse daily BP range 105-130/60-80, HR 65-113. No dizziness, lightheadedness, SOB, CP or leg swelling.    3. Pap: LMP 25 years ago. No vaginal bleeding or discharge.   4. Breast health: no pain, lumps, lesions, nipple discharge.   5. Hematemesis: no recurrent. Negative EGD. Pathology with focal erosion and reactive gastropathy only. No H. Pylori or malignancy.   Soc Hx: current smoker   Review of Systems As per HPI   No emesis, no hematemesis.     Objective:   Physical Exam BP 108/62 mmHg  Pulse 65  Temp(Src) 98.5 F (36.9 C) (Oral)  Resp 16  Ht 5\' 3"  (1.6 m)  Wt 124 lb (56.246 kg)  BMI 21.97 kg/m2  SpO2 98% General appearance: alert, cooperative and no distress Lungs: clear to auscultation bilaterally Heart: regular rate and rhythm, S1, S2 normal, no murmur, click, rub or gallop  Breasts: normal appearance, no masses or tenderness, Inspection negative, No nipple retraction or dimpling, No nipple discharge or bleeding, No axillary or supraclavicular adenopathy Pelvic: cervix normal in appearance, external genitalia normal, no adnexal masses or tenderness, no cervical motion tenderness, rectovaginal septum normal, uterus normal size, shape, and consistency and vagina normal without discharge Extremities: extremities normal, atraumatic, no cyanosis or edema Pulses: 2+ and symmetric     Assessment & Plan:

## 2013-12-30 NOTE — Progress Notes (Signed)
Annual physical and pap smear

## 2013-12-30 NOTE — Assessment & Plan Note (Signed)
Resolved

## 2013-12-30 NOTE — Patient Instructions (Signed)
[name],  Thank you for coming in today.  1. Anxiety: changed ativan to BID. Increase zoloft to 50 mg daily.   2. Elevated TSH in 09/2013, checking TSH.   3. Elevated pulse w/o dizziness or lightheadedness. Please f/u with cardiology, Darrick Grinder, NP. Phone # [phone]. Continue coreg at current dose for now.   you will be called with pap and TSH results.  F/u in 3 months

## 2013-12-30 NOTE — Patient Instructions (Signed)
START Xarelto/Apixiban (blood thinner) 5mg  twice daily.  INCREASE Coreg to 6.25mg  tablet twice daily.  Follow up with Dr. Aundra Dubin in 4 weeks.  Do the following things EVERYDAY: 1) Weigh yourself in the morning before breakfast. Write it down and keep it in a log. 2) Take your medicines as prescribed 3) Eat low salt foods-Limit salt (sodium) to 2000 mg per day.  4) Stay as active as you can everyday 5) Limit all fluids for the day to less than 2 liters

## 2013-12-30 NOTE — Assessment & Plan Note (Signed)
A: in NSR now. Reviewed home BPs and HR, one value > 110 at 113 P: Continue BB at current dose  Patient advised to have cardiology f/u since 2 week f/u from last visit was not done.

## 2013-12-30 NOTE — Progress Notes (Signed)
Patient ID: [name], female   DOB: December 29, 1955, 58 y.o.   MRN: [id number]  PCP: N/A Primary Cardiologist: Dr. Percival Spanish  HPI: [name] is a 58 y/o female with a history of ETOH abuse, ICM, HTN, stroke and chronic systolic heart failure.  Admitted to [facility] 05/06/13 for pancreatitis. She had severely reduced systolic function with an EF of 20-25%. 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 Discharge weight was 140 pounds.   Admitted to [facility] 05/2013 with A fib RVR. Started on amiodarone 400 mg tiwce a day and Xarelto 20 mg daily. Chemically converted to NSR.  Admitted to [facility]  08/2013 wit chest pain. Troponin was elelvated. LHC was unchanged from April 2015 so she had CMRI that showed LAD territory was not viable. Plan to continue to treat medically.   Admitted 9/13 through 9/23 after attempted suicide. Overdosed on all HF medications. Discharged off carvedilol, lasix, lisinopril, xarelto and digoxin. Discharge weight 120 pounds. Discharged to [facility] which is a goup home.    Follow up for Heart Failure:  Last visit she had hemoptysis and aspirin and xarelto stopped. She was sent to GI for urgent visit. Had EGD with no acute findings.  She has not had any further hemoptysis. Occasional she says her heart is racing for short amount of time. Overall feeling good. Denies SOB/PND/Orthopnea/CP. Weight at home 121-122 pounds. Smoking 2-3 cigarettes per day. Taking all medications.    ECHO 09/02/13 EF 25-30%   Labs 05/15/13 K 3.7 Creatinine 0.97 Labs 06/02/13 K 3.9 Creatinine 1.0 Dig level 1.3 --> cut back dig to 0.0625 mg Labs 06/08/13 K 4.7 Creatinine 1.1 Dig level 0.4  Labs 06/19/13: K 4.1, creatinine 1.35 Labs 08/05/13: K 4.5, creatinine 1.17, AST 25, ALT 28, cholesterol 188, TG 185, HDL 41, LDL 110 Labs 08/17/13: K 4.9 Creatinine 2.0  Labs 09/06/13: Dig level 0.9 K 4.5 Creatinine 1.13 Labs 10/13/13 K 5.1  Creatinine 2.1 10/20/13 K 4.1 Creatinine 1.32   SH: Lives with her husband. Unemployed. Stopped smoking and drinking alcohol April 2015.  FH: Father MI at the age of 74  ROS: All systems negative except as listed in HPI, PMH and Problem List.  Past Medical History  Diagnosis Date  . Diverticulosis   . Pancreatitis 1980, 2015    in the setting of ETOH  . chronic systolic heart failure     a. ECHO (05/08/13): EF 20-25%, diff HK with akinesis of basal inferior wall, grade 1 DD, trivial MR, trivial TR b) RHC (05/06/13): RA 7, RV 30/2, PA 31/19 (24), PCWP 10, Fick CO/CI: 2.9/1.74, Thermo CO/Ci: 2.4/1.4, PA sat 53%  . Ischemic cardiomyopathy   . CAD (coronary artery disease)     a. LHC (04/2013): Chronically occluded LAD, moderate dz in large OM1 and severe dz and small posterior lateral vessel    . A-fib 2015  . Suicidal overdose 10/03/13    Overdoes on Beta Blockers & Xarelto [facility])  . Anxiety 2015   . Psoriasis 1968  . GERD (gastroesophageal reflux disease) 2015   . Hyperlipidemia 2015  . Hypertension 2015  . Stroke 2011  . ETOH abuse 1981    started abusing alcohol at age 54     Current Outpatient Prescriptions  Medication Sig Dispense Refill  . acetaminophen (TYLENOL) 325 MG tablet Take 650 mg by mouth every 6 (six) hours as needed for fever or headache.    Marland Kitchen  ammonium lactate (LAC-HYDRIN) 12 % cream Apply topically as needed for dry skin. Apply to elbows 385 g 1  . atorvastatin (LIPITOR) 40 MG tablet Take 40 mg by mouth daily.    . carvedilol (COREG) 3.125 MG tablet Take 1 tablet (3.125 mg total) by mouth 2 (two) times daily with a meal. Hold for heart rate less than 60 60 tablet 6  . furosemide (LASIX) 20 MG tablet Take 1 tablet (20 mg total) by mouth as needed. Take 20 mg for weight 128 pounds or greater. 30 tablet 6  . hydrALAZINE (APRESOLINE) 25 MG tablet Take 0.5 tablets (12.5 mg total) by mouth 3 (three) times daily. Please check BP daily and record. Hold for SBP < 90 45  tablet 6  . isosorbide mononitrate (IMDUR) 30 MG 24 hr tablet Take 2 tablets (60 mg total) by mouth at bedtime. 60 tablet 6  . levothyroxine (SYNTHROID, LEVOTHROID) 25 MCG tablet Take 0.125 mcg by mouth daily before breakfast.    . LORazepam (ATIVAN) 0.5 MG tablet Take 1 tablet (0.5 mg total) by mouth 2 (two) times daily. 60 tablet 2  . nitroGLYCERIN (NITROSTAT) 0.3 MG SL tablet Place 0.3 mg under the tongue every 5 (five) minutes as needed for chest pain.    Marland Kitchen ondansetron (ZOFRAN) 4 MG tablet Take 1 tablet (4 mg total) by mouth every 8 (eight) hours as needed for nausea or vomiting. 30 tablet 1  . pantoprazole (PROTONIX) 40 MG tablet Take 1 tablet (40 mg total) by mouth 2 (two) times daily before a meal. 60 tablet 3  . sertraline (ZOLOFT) 50 MG tablet Take 1 tablet (50 mg total) by mouth daily. 90 tablet 1   No current facility-administered medications for this encounter.    Filed Vitals:   12/30/13 1233  BP: 140/90  Pulse: 100  Weight: 123 lb (55.792 kg)  SpO2: 100%    PHYSICAL EXAM: General: No resp difficulty. Ambulated in the clinic with no difficulties. Caregiver present  HEENT: normal Neck: supple. JVP 5-6.  Carotids 2+ bilaterally; no bruits. No lymphadenopathy or thryomegaly appreciated. Cor: PMI normal. Regular rate & rhythm. No rubs, gallops or murmurs. Lungs: clear Abdomen: soft, nontender, nondistended. No hepatosplenomegaly. No bruits or masses. Good bowel sounds. Extremities: no cyanosis, clubbing, rash, edema Neuro: alert & orientedx3, cranial nerves grossly intact. Moves all 4 extremities w/o difficulty. Affect pleasant.  EKG: NSR 81 bpm LBBB  ASSESSMENT & PLAN:  1. Chronic Systolic Heart Failure: Primarily ICM thought ETOH may play a role.  EF 25-30%  (08/2013) Cardiac MRI - August 2015 No viability noted LAD territory. LBBB noted 08/2013.  Admitted  10/03/13 -->  suicide attempt-  she  overdosed on her HF medications.  Now we are slowly adding back HF meds.   She  remains at a [facility] and she receives all medications from the staff.  - NYHA II symptoms and volume status stable.  Continue  20 mg lasix as needed for weight 128 or greater .  - Increase carvedilol 6.25 mg twice a day.  -Continue 12.5 mg hydralazine three times a day and 60 mg imdur daily.  -Hold off on Ace for now due to dizziness.  In the past she developed hyperkalemia on spironolactone.  -  Currently holding off on ICD. LBBB noted on EKG. Will need to be back on HF meds for 6 months and then plan to refer back to EP (Feb or March) .  Reinforced the need and importance of daily weights,  a low sodium diet, and fluid restriction (less than 2 L a day). Instructed to call the HF clinic if weight increases more than 3 lbs overnight or 5 lbs in a week.   Check BMET  2. CAD: LHC with chronically occluded LAD, moderate disease in a large OM 2 and severe disease and small posterior lateral vessel. CMRI - no viability in LAD territory. No aspirin as she is going to start  Apixaban today. Will not restart Xarelto as she had hemoptysis.  Continue  statin.  Continue  imdur to 60 mg daily at  bed time. Remains on carvedilol.   3. Current smoker: Encouraged to stop smoking.  Declines smoking cessation.   4. Former Alcohol Abuse- Quit April 2015. .  5.  PAF- maintaining NSR. Off amiodarone since July. Xarelto stopped last viist due to hemoptysis. This has resolved.and she was evaluated by GI. Discussed with Dr Aundra Dubin . Start apixaban 5 mg twice a day. Check CBC today 6. Bradycardia- After BB overdose. That has resolved.  7. Suicide Attempt-  Recently hospitalized for suicide attempt --> took all HF medications. She is currently on list for [facility] to start counseling. Continue daily Zoloft.  8. Hemoptysis- off Xarelto and aspirin. Continue  protonix. 40 mg daily .  EGD with no source of melena.  Carefully start apixaban.     Follow up 4 weeks with Dr Aundra Dubin.     CLEGG,AMY NP-C 12:33 PM

## 2013-12-30 NOTE — Progress Notes (Signed)
CSW met with patient in the clinic. Patient reports she is doing well at the group home and feels very supported. Patient states she has difficulty at times contacting her sister by phone. CSW discussed option of government issued [place] phone for [employer] recipients. Patient very interested and will follow through with resource provided by CSW. CSW will continue to follow for support as needed. Raquel Sarna, [place]

## 2013-12-30 NOTE — Assessment & Plan Note (Signed)
A: persistent. Fair control P: Changed ativan from prn to BID scheduled Increased zoloft to 50 mg daily

## 2013-12-30 NOTE — Assessment & Plan Note (Signed)
Pap done today  

## 2013-12-30 NOTE — Addendum Note (Signed)
Encounter addended by: Renee Pain, RN on: 12/30/2013 12:59 PM<BR>     Documentation filed: Patient Instructions Section, Dx Association, Orders

## 2013-12-31 LAB — CERVICOVAGINAL ANCILLARY ONLY
CHLAMYDIA, DNA PROBE: NEGATIVE
Neisseria Gonorrhea: NEGATIVE
WET PREP (BD AFFIRM): NEGATIVE
WET PREP (BD AFFIRM): POSITIVE — AB
Wet Prep (BD Affirm): NEGATIVE

## 2013-12-31 LAB — CYTOLOGY - PAP

## 2013-12-31 NOTE — Addendum Note (Signed)
Encounter addended by: Georga Kaufmann, CCT on: 12/31/2013 11:34 AM<BR>     Documentation filed: Charges VN

## 2014-01-06 ENCOUNTER — Telehealth: Payer: Self-pay | Admitting: Gastroenterology

## 2014-01-06 DIAGNOSIS — Z8719 Personal history of other diseases of the digestive system: Secondary | ICD-10-CM

## 2014-01-06 NOTE — Telephone Encounter (Signed)
Please call pt. HER stomach Bx shows mild gastritis.   CONTINUE PROTONIX. TAKE 30 MINUTES PRIOR TO MEALS TWICE DAILY. FOLLOW A LOW FAT DIET. RECHECK CBC WITHIN NEXT 7 DAYS. FOLLOW UP IN FEB 2016 E30 UGIB-MELENA/GASTRITIS.

## 2014-01-10 ENCOUNTER — Encounter: Payer: Self-pay | Admitting: Gastroenterology

## 2014-01-10 NOTE — Telephone Encounter (Signed)
APPOINTMENT MADE AND LETTER SENT °

## 2014-01-11 NOTE — Telephone Encounter (Signed)
Pt is aware. Lab order sent to pt per her request.

## 2014-01-12 ENCOUNTER — Telehealth: Payer: Self-pay | Admitting: *Deleted

## 2014-01-12 NOTE — Telephone Encounter (Signed)
-----   Message from [name], MD sent at 01/03/2014  1:48 PM EST ----- Negative pap, repeat in 3-5 years

## 2014-01-12 NOTE — Telephone Encounter (Signed)
-----   Message from [name], MD sent at 12/30/2013  6:23 PM EST ----- Normal TSH continue current synthroid dose

## 2014-01-12 NOTE — Telephone Encounter (Signed)
-----   Message from [name], MD sent at 12/31/2013  4:25 PM EST ----- Negative Gc/chlam

## 2014-01-20 LAB — CBC WITH DIFFERENTIAL/PLATELET
Basophils Absolute: 0.1 10*3/uL (ref 0.0–0.1)
Basophils Relative: 1 % (ref 0–1)
EOS PCT: 4 % (ref 0–5)
Eosinophils Absolute: 0.2 10*3/uL (ref 0.0–0.7)
HEMATOCRIT: 28.2 % — AB (ref 36.0–46.0)
HEMOGLOBIN: 9.3 g/dL — AB (ref 12.0–15.0)
LYMPHS PCT: 31 % (ref 12–46)
Lymphs Abs: 1.6 10*3/uL (ref 0.7–4.0)
MCH: 27 pg (ref 26.0–34.0)
MCHC: 33 g/dL (ref 30.0–36.0)
MCV: 82 fL (ref 78.0–100.0)
MONO ABS: 0.6 10*3/uL (ref 0.1–1.0)
MONOS PCT: 11 % (ref 3–12)
MPV: 10.7 fL (ref 9.4–12.4)
Neutro Abs: 2.7 10*3/uL (ref 1.7–7.7)
Neutrophils Relative %: 53 % (ref 43–77)
Platelets: 268 10*3/uL (ref 150–400)
RBC: 3.44 MIL/uL — AB (ref 3.87–5.11)
RDW: 13.7 % (ref 11.5–15.5)
WBC: 5 10*3/uL (ref 4.0–10.5)

## 2014-01-27 ENCOUNTER — Ambulatory Visit (HOSPITAL_COMMUNITY)
Admission: RE | Admit: 2014-01-27 | Discharge: 2014-01-27 | Disposition: A | Discharge status: Home / Self Care | Payer: Medicaid Other | Source: Ambulatory Visit | Attending: Internal Medicine | Admitting: Internal Medicine

## 2014-01-27 VITALS — BP 110/68 | HR 83 | Wt 124.8 lb

## 2014-01-27 DIAGNOSIS — I2510 Atherosclerotic heart disease of native coronary artery without angina pectoris: Secondary | ICD-10-CM | POA: Insufficient documentation

## 2014-01-27 DIAGNOSIS — Z720 Tobacco use: Secondary | ICD-10-CM

## 2014-01-27 DIAGNOSIS — F17210 Nicotine dependence, cigarettes, uncomplicated: Secondary | ICD-10-CM | POA: Diagnosis not present

## 2014-01-27 DIAGNOSIS — I5022 Chronic systolic (congestive) heart failure: Secondary | ICD-10-CM | POA: Diagnosis present

## 2014-01-27 DIAGNOSIS — R001 Bradycardia, unspecified: Secondary | ICD-10-CM | POA: Diagnosis not present

## 2014-01-27 DIAGNOSIS — I480 Paroxysmal atrial fibrillation: Secondary | ICD-10-CM | POA: Diagnosis not present

## 2014-01-27 DIAGNOSIS — F1021 Alcohol dependence, in remission: Secondary | ICD-10-CM | POA: Insufficient documentation

## 2014-01-27 LAB — LIPID PANEL
CHOL/HDL RATIO: 3.8 ratio
CHOLESTEROL: 172 mg/dL (ref 0–200)
HDL: 45 mg/dL (ref >39)
LDL Cholesterol: 108 mg/dL — ABNORMAL HIGH (ref 0–99)
Triglycerides: 96 mg/dL (ref <150)
VLDL: 19 mg/dL (ref 0–40)

## 2014-01-27 LAB — CBC
HEMATOCRIT: 31.5 % — AB (ref 36.0–46.0)
Hemoglobin: 9.9 g/dL — ABNORMAL LOW (ref 12.0–15.0)
MCH: 26.5 pg (ref 26.0–34.0)
MCHC: 31.4 g/dL (ref 30.0–36.0)
MCV: 84.5 fL (ref 78.0–100.0)
Platelets: 258 10*3/uL (ref 150–400)
RBC: 3.73 MIL/uL — AB (ref 3.87–5.11)
RDW: 13.7 % (ref 11.5–15.5)
WBC: 5.1 10*3/uL (ref 4.0–10.5)

## 2014-01-27 LAB — BASIC METABOLIC PANEL
Anion gap: 8 (ref 5–15)
BUN: 18 mg/dL (ref 6–23)
CO2: 25 mmol/L (ref 19–32)
Calcium: 9.5 mg/dL (ref 8.4–10.5)
Chloride: 107 mEq/L (ref 96–112)
Creatinine, Ser: 1.23 mg/dL — ABNORMAL HIGH (ref 0.50–1.10)
GFR, EST AFRICAN AMERICAN: 55 mL/min — AB (ref >90)
GFR, EST NON AFRICAN AMERICAN: 47 mL/min — AB (ref >90)
Glucose, Bld: 86 mg/dL (ref 70–99)
POTASSIUM: 4.1 mmol/L (ref 3.5–5.1)
Sodium: 140 mmol/L (ref 135–145)

## 2014-01-27 MED ORDER — LISINOPRIL 5 MG PO TABS: 5.0000 mg | ORAL_TABLET | Freq: Every day | ORAL | Status: DC

## 2014-01-27 NOTE — Patient Instructions (Addendum)
STOP Hydralazine.  STOP Isosorbide (Imdur).  START Lisinopril 5mg  tablet once daily.  Follow up in 1 month.  Do the following things EVERYDAY: 1) Weigh yourself in the morning before breakfast. Write it down and keep it in a log. 2) Take your medicines as prescribed 3) Eat low salt foods-Limit salt (sodium) to 2000 mg per day.  4) Stay as active as you can everyday 5) Limit all fluids for the day to less than 2 liters

## 2014-01-27 NOTE — Progress Notes (Signed)
Patient ID: [name], female   DOB: 07/21/1955, 59 y.o.   MRN: [id number] PCP:  Dr Adrian Blackwater  HPI: [name] is a 59 y/o female with a history of ETOH abuse, ICM, HTN, stroke and chronic systolic heart failure.  Admitted to [facility] 05/06/13 for pancreatitis. She had severely reduced systolic function with an EF of 20-25%. 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 Discharge weight was 140 pounds.   Admitted to [facility] 05/2013 with A fib RVR. Started on amiodarone 400 mg tiwce a day and Xarelto 20 mg daily. Chemically converted to NSR.  Admitted to [facility]  08/2013 wit chest pain. Troponin was elelvated. LHC was unchanged from April 2015 so she had CMRI that showed LAD territory was not viable. Plan to continue to treat medically.   Admitted 9/13 through 9/23 after attempted suicide. Overdosed on all HF medications. Discharged off carvedilol, lasix, lisinopril, xarelto and digoxin. Discharge weight 120 pounds. Discharged to [facility] which is a goup home.    At a prior visit, she had hematemesis and aspirin and xarelto stopped. She was sent to GI for urgent visit. Had EGD with no acute findings.  She has not had any further hematemesis, no BRBPR.  At last appointment, she restarted Eliquis without problems.  Smoking 2-3 cigarettes per day. Taking all medications.  No chest pain, no exertional dyspnea, no orthopnea or PND.  No tachypalpitations.   ECHO 09/02/13 EF 25-30%   Labs 05/15/13 K 3.7 Creatinine 0.97 Labs 06/02/13 K 3.9 Creatinine 1.0 Dig level 1.3 --> cut back dig to 0.0625 mg Labs 06/08/13 K 4.7 Creatinine 1.1 Dig level 0.4  Labs 06/19/13: K 4.1, creatinine 1.35 Labs 08/05/13: K 4.5, creatinine 1.17, AST 25, ALT 28, cholesterol 188, TG 185, HDL 41, LDL 110 Labs 08/17/13: K 4.9 Creatinine 2.0  Labs 09/06/13: Dig level 0.9 K 4.5 Creatinine 1.13 Labs 10/13/13 K 5.1 Creatinine 2.1 Labs 10/20/13 K 4.1 Creatinine 1.32    SH: Lives in a group home. Unemployed. Stopped smoking and drinking alcohol April 2015.   FH: [name] at the age of 41  ROS: All systems negative except as listed in HPI, PMH and Problem List.  Past Medical History  Diagnosis Date  . Diverticulosis   . Pancreatitis 1980, 2015    in the setting of ETOH  . chronic systolic heart failure     a. ECHO (05/08/13): EF 20-25%, diff HK with akinesis of basal inferior wall, grade 1 DD, trivial MR, trivial TR b) RHC (05/06/13): RA 7, RV 30/2, PA 31/19 (24), PCWP 10, Fick CO/CI: 2.9/1.74, Thermo CO/Ci: 2.4/1.4, PA sat 53%  . Ischemic cardiomyopathy   . CAD (coronary artery disease)     a. LHC (04/2013): Chronically occluded LAD, moderate dz in large OM1 and severe dz and small posterior lateral vessel    . A-fib 2015  . Suicidal overdose 10/03/13    Overdoes on Beta Blockers & Xarelto [facility] ED)  . Anxiety 2015   . Psoriasis 1968  . GERD (gastroesophageal reflux disease) 2015   . Hyperlipidemia 2015  . Hypertension 2015  . Stroke 2011  . ETOH abuse 1981    started abusing alcohol at age 46     Current Outpatient Prescriptions  Medication Sig Dispense Refill  . ammonium lactate (LAC-HYDRIN) 12 % cream Apply topically as needed for dry skin. Apply to elbows 385 g 1  . apixaban (ELIQUIS)  5 MG TABS tablet Take 1 tablet (5 mg total) by mouth 2 (two) times daily. 60 tablet 3  . atorvastatin (LIPITOR) 40 MG tablet Take 40 mg by mouth daily.    . carvedilol (COREG) 6.25 MG tablet Take 1 tablet (6.25 mg total) by mouth 2 (two) times daily with a meal. Hold for heart rate less than 60 60 tablet 3  . levothyroxine (SYNTHROID, LEVOTHROID) 25 MCG tablet Take 0.125 mcg by mouth daily before breakfast.    . LORazepam (ATIVAN) 0.5 MG tablet Take 1 tablet (0.5 mg total) by mouth 2 (two) times daily. 60 tablet 2  . ondansetron (ZOFRAN) 4 MG tablet Take 1 tablet (4 mg total) by mouth every 8 (eight) hours as needed for nausea or vomiting. 30 tablet 1  .  pantoprazole (PROTONIX) 40 MG tablet Take 1 tablet (40 mg total) by mouth 2 (two) times daily before a meal. (Patient taking differently: Take 40 mg by mouth daily. ) 60 tablet 3  . sertraline (ZOLOFT) 50 MG tablet Take 1 tablet (50 mg total) by mouth daily. 90 tablet 1  . acetaminophen (TYLENOL) 325 MG tablet Take 650 mg by mouth every 6 (six) hours as needed for fever or headache.    . furosemide (LASIX) 20 MG tablet Take 1 tablet (20 mg total) by mouth as needed. Take 20 mg for weight 128 pounds or greater. (Patient not taking: Reported on 01/27/2014) 30 tablet 6  . lisinopril (PRINIVIL,ZESTRIL) 5 MG tablet Take 1 tablet (5 mg total) by mouth daily. 90 tablet 3  . nitroGLYCERIN (NITROSTAT) 0.3 MG SL tablet Place 0.3 mg under the tongue every 5 (five) minutes as needed for chest pain.     No current facility-administered medications for this encounter.    Filed Vitals:   01/27/14 1001  BP: 110/68  Pulse: 83  Weight: 124 lb 12.8 oz (56.609 kg)  SpO2: 100%    PHYSICAL EXAM: General: No resp difficulty. Ambulated in the clinic with no difficulties. Caregiver present  HEENT: normal Neck: supple. JVP 5-6.  Carotids 2+ bilaterally; no bruits. No lymphadenopathy or thryomegaly appreciated. Cor: PMI normal. Regular rate & rhythm. No rubs, gallops or murmurs. Lungs: clear Abdomen: soft, nontender, nondistended. No hepatosplenomegaly. No bruits or masses. Good bowel sounds. Extremities: no cyanosis, clubbing, rash, edema Neuro: alert & orientedx3, cranial nerves grossly intact. Moves all 4 extremities w/o difficulty. Affect pleasant.  EKG: NSR LBBB-like IVCD 150 msec  ASSESSMENT & PLAN:  1. Chronic Systolic Heart Failure: Primarily ischemic cardiomyopathy but ETOH may play a role.  EF 25-30%  (08/2013). Cardiac MRI - August 2015 No viability noted LAD territory.  Admitted  10/03/13 -->  suicide attempt-  she  overdosed on her HF medications.  Now we are slowly adding back HF meds. She remains at  a [facility] and she receives all medications from the staff. NYHA II symptoms and volume status stable.   - Continue  20 mg lasix as needed for weight 128 or greater .  - Continue 6.25 mg twice a day.  - Creatinine normal, will stop hydralazine/Imdur and start lisinopril 5 mg daily.  BMET in 10 days.   - Repeat echo in 3/15, if EF remains low will refer to EP for CRT-D.   2. CAD: LHC with chronically occluded LAD, moderate disease in a large OM 2 and severe disease in small posterior lateral vessel. CMRI - no viability in LAD territory. No aspirin as she is on apixaban.  Continue statin.  Check lipids today.  3. Current smoker: Encouraged to stop smoking.  Declines smoking cessation.   4. Former Alcohol Abuse: Quit April 2015. .  5.  PAF: maintaining NSR. Off amiodarone since July. Xarelto with hematemesis, now on apixaban with no problems.  CBC today. 6. Bradycardia: After BB overdose. That has resolved.  7. Suicide Attempt: Recently hospitalized for suicide attempt --> took all HF medications. She is currently on list for [facility] start counseling. Continue daily Zoloft.   Followup in 1 month.   [name] 01/27/2014

## 2014-01-28 NOTE — Addendum Note (Signed)
Encounter addended by: Georga Kaufmann, CCT on: 01/28/2014  1:23 PM<BR>     Documentation filed: Charges VN

## 2014-01-31 ENCOUNTER — Telehealth (HOSPITAL_COMMUNITY): Payer: Self-pay | Admitting: *Deleted

## 2014-01-31 MED ORDER — ATORVASTATIN CALCIUM 80 MG PO TABS: 80.0000 mg | ORAL_TABLET | Freq: Every day | ORAL | Status: DC

## 2014-01-31 NOTE — Telephone Encounter (Signed)
-----   Message from Larey Dresser, MD sent at 01/28/2014  5:18 PM EST ----- Increase atorvastatin to 80 mg daily with lipids/LFTs in 2 months.

## 2014-02-02 ENCOUNTER — Telehealth: Payer: Self-pay | Admitting: Family Medicine

## 2014-02-02 NOTE — Telephone Encounter (Signed)
[name], Supervisor at [facility], where patient resides, states patient was on asprin and it was DC'd by PCP, [name] calling to have the DC order sent to [facility] in [place]. Please contact [name] at [phone]. Thanks

## 2014-02-07 ENCOUNTER — Telehealth (HOSPITAL_COMMUNITY): Payer: Self-pay

## 2014-02-07 NOTE — Telephone Encounter (Signed)
RN where patient resides called stating patient took 1 SL NTG tab for increased SOB this morning.  NO chest pain, weight stable at 124lb, no edema, patient took rest of her am meds as well.  Feels fine now, SL NTG tab quickly resolved issue.  Advised if this happens again to seek emergency medical assistance for full cardiac workup.  Will make MD aware.  RN and patient aware and agreeable to plan.  'Renee Pain

## 2014-02-09 ENCOUNTER — Telehealth (HOSPITAL_COMMUNITY): Payer: Self-pay | Admitting: Cardiology

## 2014-02-09 NOTE — Telephone Encounter (Signed)
Pharmacy called to verify most recent med changes D/c order given verbally to Stop, hydralazine, imdur and start lisinopril 5 mg  Veronica verbalized understanding and read back changes

## 2014-02-17 NOTE — Telephone Encounter (Signed)
PLEASE CALL PT. HER BLOOD COUNT IS STABLE.  PLEASE CALL if She seeS black tarry stools or has rectal bleeding. OPV FEB 2016.

## 2014-02-17 NOTE — Telephone Encounter (Signed)
Called and informed caregiver, April Ellison. She is aware of OV appt 02/28/2014 at 11:00 AM.

## 2014-02-24 ENCOUNTER — Other Ambulatory Visit: Payer: Self-pay | Admitting: Gastroenterology

## 2014-02-28 ENCOUNTER — Ambulatory Visit (INDEPENDENT_AMBULATORY_CARE_PROVIDER_SITE_OTHER): Payer: Medicaid Other | Admitting: Gastroenterology

## 2014-02-28 ENCOUNTER — Telehealth: Payer: Self-pay

## 2014-02-28 ENCOUNTER — Encounter: Payer: Self-pay | Admitting: Gastroenterology

## 2014-02-28 VITALS — BP 118/71 | HR 70 | Temp 97.0°F | Ht 63.0 in | Wt 126.0 lb

## 2014-02-28 DIAGNOSIS — K2990 Gastroduodenitis, unspecified, without bleeding: Secondary | ICD-10-CM

## 2014-02-28 DIAGNOSIS — Z800 Family history of malignant neoplasm of digestive organs: Secondary | ICD-10-CM

## 2014-02-28 DIAGNOSIS — K2970 Gastritis, unspecified, without bleeding: Secondary | ICD-10-CM | POA: Insufficient documentation

## 2014-02-28 DIAGNOSIS — D649 Anemia, unspecified: Secondary | ICD-10-CM

## 2014-02-28 NOTE — Telephone Encounter (Signed)
Request to hold Eliquis 48-72 hours for procedure faxed to Dr. Aundra Dubin.

## 2014-02-28 NOTE — Patient Instructions (Addendum)
Continue Protonix twice a day.   You may take Zantac each evening as needed for indigestion.   You have been scheduled for a colonoscopy with Dr. Oneida Alar due to your family history of colon cancer.   We are asking cardiology about holding Eliquis for 2-3 days prior. We will let you know for sure once we hear from them!  Food Choices for Gastroesophageal Reflux Disease When you have gastroesophageal reflux disease (GERD), the foods you eat and your eating habits are very important. Choosing the right foods can help ease the discomfort of GERD. WHAT GENERAL GUIDELINES DO I NEED TO FOLLOW?  Choose fruits, vegetables, whole grains, low-fat dairy products, and low-fat meat, fish, and poultry.  Limit fats such as oils, salad dressings, butter, nuts, and avocado.  Keep a food diary to identify foods that cause symptoms.  Avoid foods that cause reflux. These may be different for different people.  Eat frequent small meals instead of three large meals each day.  Eat your meals slowly, in a relaxed setting.  Limit fried foods.  Cook foods using methods other than frying.  Avoid drinking alcohol.  Avoid drinking large amounts of liquids with your meals.  Avoid bending over or lying down until 2-3 hours after eating. WHAT FOODS ARE NOT RECOMMENDED? The following are some foods and drinks that may worsen your symptoms: Vegetables Tomatoes. Tomato juice. Tomato and spaghetti sauce. Chili peppers. Onion and garlic. Horseradish. Fruits Oranges, grapefruit, and lemon (fruit and juice). Meats High-fat meats, fish, and poultry. This includes hot dogs, ribs, ham, sausage, salami, and bacon. Dairy Whole milk and chocolate milk. Sour cream. Cream. Butter. Ice cream. Cream cheese.  Beverages Coffee and tea, with or without caffeine. Carbonated beverages or energy drinks. Condiments Hot sauce. Barbecue sauce.  Sweets/Desserts Chocolate and cocoa. Donuts. Peppermint and spearmint. Fats and  Oils High-fat foods, including [place] fries and potato chips. Other Vinegar. Strong spices, such as black pepper, white pepper, red pepper, cayenne, curry powder, cloves, ginger, and chili powder. The items listed above may not be a complete list of foods and beverages to avoid. Contact your dietitian for more information. Document Released: 01/07/2005 Document Revised: 01/12/2013 Document Reviewed: 11/11/2012 [facility] Patient Information 2015 [place], [place]. This information is not intended to replace advice given to you by your health care provider. Make sure you discuss any questions you have with your health care provider.

## 2014-02-28 NOTE — Progress Notes (Signed)
Referring Provider: [name], MD Primary Care Physician:  [name], MD  Primary GI: Dr. Oneida Alar   Chief Complaint  Patient presents with  . Follow-up    HPI:   [name] is a 59 y.o. female presenting today with a history of hematemesis in the setting of Xarelto, seen Oct 2015. Underwent EGD with findings of gastritis and duodenitis.   No N/V. No hematemesis. No melena. No abdominal pain. Appetite good. No constipation, diarrhea. No hematochezia. Sometimes feels like a big bubble sometimes coming up in her chest at night, indigestion. Doesn't eat late at night.   Last colonoscopy per patient in the remote past. Notes her mother was diagnosed with colon cancer in her 82s.   Past Medical History  Diagnosis Date  . Diverticulosis   . Pancreatitis 1980, 2015    in the setting of ETOH  . chronic systolic heart failure     a. ECHO (05/08/13): EF 20-25%, diff HK with akinesis of basal inferior wall, grade 1 DD, trivial MR, trivial TR b) RHC (05/06/13): RA 7, RV 30/2, PA 31/19 (24), PCWP 10, Fick CO/CI: 2.9/1.74, Thermo CO/Ci: 2.4/1.4, PA sat 53%  . Ischemic cardiomyopathy   . CAD (coronary artery disease)     a. LHC (04/2013): Chronically occluded LAD, moderate dz in large OM1 and severe dz and small posterior lateral vessel    . A-fib 2015  . Suicidal overdose 10/03/13    Overdoes on Beta Blockers & Xarelto [facility] ED)  . Anxiety 2015   . Psoriasis 1968  . GERD (gastroesophageal reflux disease) 2015   . Hyperlipidemia 2015  . Hypertension 2015  . Stroke 2011  . ETOH abuse 1981    started abusing alcohol at age 48     Past Surgical History  Procedure Laterality Date  . Colostomy takedown  2007  . Exploratory laparotomy with colon resection, colostomy  2007  . Esophagogastroduodenoscopy (egd) with propofol N/A 11/22/2013    Dr. Oneida Alar: gastritis and duodenitis, negative H.pylori  . Esophageal biopsy N/A 11/22/2013    Procedure: GASTRIC BIOPSIES;   Surgeon: Danie Binder, MD;  Location: [facility];  Service: Endoscopy;  Laterality: N/A;  . Left and right heart catheterization with coronary angiogram N/A 05/10/2013    Procedure: LEFT AND RIGHT HEART CATHETERIZATION WITH CORONARY ANGIOGRAM;  Surgeon: [name], MD;  Location: [facility] CATH LAB;  Service: Cardiovascular;  Laterality: N/A;  . Left heart catheterization with coronary angiogram N/A 09/03/2013    Procedure: LEFT HEART CATHETERIZATION WITH CORONARY ANGIOGRAM;  Surgeon: Sinclair Grooms, MD;  Location: [facility] CATH LAB;  Service: Cardiovascular;  Laterality: N/A;    Current Outpatient Prescriptions  Medication Sig Dispense Refill  . acetaminophen (TYLENOL) 325 MG tablet Take 650 mg by mouth every 6 (six) hours as needed for fever or headache.    Marland Kitchen ammonium lactate (LAC-HYDRIN) 12 % cream Apply topically as needed for dry skin. Apply to elbows 385 g 1  . apixaban (ELIQUIS) 5 MG TABS tablet Take 1 tablet (5 mg total) by mouth 2 (two) times daily. 60 tablet 3  . atorvastatin (LIPITOR) 80 MG tablet Take 1 tablet (80 mg total) by mouth daily. 30 tablet 3  . carvedilol (COREG) 6.25 MG tablet Take 1 tablet (6.25 mg total) by mouth 2 (two) times daily with a meal. Hold for heart rate less than 60 60 tablet 3  . furosemide (LASIX) 20 MG tablet Take 1 tablet (20 mg  total) by mouth as needed. Take 20 mg for weight 128 pounds or greater. 30 tablet 6  . levothyroxine (SYNTHROID, LEVOTHROID) 25 MCG tablet Take 0.125 mcg by mouth daily before breakfast.    . lisinopril (PRINIVIL,ZESTRIL) 5 MG tablet Take 1 tablet (5 mg total) by mouth daily. 90 tablet 3  . LORazepam (ATIVAN) 0.5 MG tablet Take 1 tablet (0.5 mg total) by mouth 2 (two) times daily. 60 tablet 2  . nitroGLYCERIN (NITROSTAT) 0.3 MG SL tablet Place 0.3 mg under the tongue every 5 (five) minutes as needed for chest pain.    Marland Kitchen ondansetron (ZOFRAN) 4 MG tablet Take 1 tablet (4 mg total) by mouth every 8 (eight) hours as needed for nausea or  vomiting. 30 tablet 1  . pantoprazole (PROTONIX) 40 MG tablet TAKE 1 TABLET BY MOUTH TWICE DAILY BEFORE A MEAL. 60 tablet 5  . sertraline (ZOLOFT) 50 MG tablet Take 1 tablet (50 mg total) by mouth daily. 90 tablet 1   No current facility-administered medications for this visit.    Allergies as of 02/28/2014 - Review Complete 02/28/2014  Allergen Reaction Noted  . Morphine and related Other (See Comments) 09/24/2013  . Penicillins Anaphylaxis and Rash 05/06/2013    Family History  Problem Relation Age of Onset  . CAD Father 72    MI  . CAD Mother 34    MI  . Alcohol abuse Mother   . CAD Sister 4    Stent  . Diabetes Sister   . CAD Brother 76    MI  . Cancer Neg Hx   . Colon cancer Mother     in her 69s    History   Social History  . Marital Status: Married    Spouse Name: N/A    Number of Children: 2  . Years of Education: 10   Occupational History  . Unemployed     Social History Main Topics  . Smoking status: Current Some Day Smoker -- 0.25 packs/day for 47 years    Types: Cigarettes  . Smokeless tobacco: Never Used     Comment: she smokes occasionally, trying to quit  . Alcohol Use: No     Comment: last drink in 03/2013   . Drug Use: No  . Sexual Activity: No   Other Topics Concern  . None   Social History Narrative   Lived previously with her spouse in an [place].     Son and daughter   Daughter in [place]   Son in [place]   Two grandchildren.       Presently in an assisted living facility [place], moved in 10/13/13.           Review of Systems: As mentioned in HPI.   Physical Exam: BP 118/71 mmHg  Pulse 70  Temp(Src) 97 F (36.1 C) (Oral)  Ht 5\' 3"  (1.6 m)  Wt 126 lb (57.153 kg)  BMI 22.33 kg/m2  LMP 01/22/1988 (Approximate) General:   Alert and oriented. No distress noted. Pleasant and cooperative.  Head:  Normocephalic and atraumatic. Eyes:  Conjuctiva clear without scleral icterus. Mouth:  Oral mucosa pink and moist. Good  dentition. No lesions. Heart:  S1, S2 present without murmurs, rubs, or gallops. Regular rate and rhythm. Abdomen:  +BS, soft, non-tender and non-distended. No rebound or guarding. No HSM or masses noted. Msk:  Symmetrical without gross deformities. Normal posture. Extremities:  Without edema. Neurologic:  Alert and  oriented x4;  grossly normal neurologically. Skin:  Intact without significant lesions or rashes. Psych:  Alert and cooperative. Normal mood and affect.  Lab Results  Component Value Date   WBC 5.1 01/27/2014   HGB 9.9* 01/27/2014   HCT 31.5* 01/27/2014   MCV 84.5 01/27/2014   PLT 258 01/27/2014

## 2014-02-28 NOTE — Telephone Encounter (Signed)
Per pt she sees Dr. Aundra Dubin @ [facility] in [place]. Phone [phone] Fax [phone]

## 2014-03-01 ENCOUNTER — Telehealth: Payer: Self-pay | Admitting: Gastroenterology

## 2014-03-01 ENCOUNTER — Encounter: Payer: Self-pay | Admitting: Gastroenterology

## 2014-03-01 DIAGNOSIS — D509 Iron deficiency anemia, unspecified: Secondary | ICD-10-CM | POA: Insufficient documentation

## 2014-03-01 NOTE — Assessment & Plan Note (Addendum)
No further hematemesis, no melena. PPI BID with occasional breakthrough. Continue current regimen. Add Zantac each evening prn. GERD diet provided.

## 2014-03-01 NOTE — Assessment & Plan Note (Signed)
Unknown iron levels. Anticipate secondary to chronic disease. Check iron and ferritin now.

## 2014-03-01 NOTE — Telephone Encounter (Signed)
Can we have patient get iron, ferritin, and TIBC completed? I did not order this at her visit but I meant to.

## 2014-03-01 NOTE — Assessment & Plan Note (Addendum)
59 year old female with last colonoscopy in remote past; her mother was diagnosed with colon cancer in her 49s. No concerning lower GI symptoms. Appears Hgb around baseline for patient. On Eliquis with history of afib. Will request to hold for 48-72 hours (verify with cardiology).   Proceed with colonoscopy with Dr. Oneida Alar in the near future. The risks, benefits, and alternatives have been discussed in detail with the patient. They state understanding and desire to proceed.  Propofol due to polypharmacy PER CARDIOLOGY: may hold 48 hours.

## 2014-03-01 NOTE — Telephone Encounter (Signed)
I called and the fax was received. Dr. Aundra Dubin is out of the office til Thursday.

## 2014-03-02 ENCOUNTER — Other Ambulatory Visit: Payer: Self-pay

## 2014-03-02 DIAGNOSIS — D649 Anemia, unspecified: Secondary | ICD-10-CM

## 2014-03-02 NOTE — Telephone Encounter (Signed)
Informed Mariann Laster at [facility] and lab orders have been faxed to [facility].

## 2014-03-08 NOTE — Telephone Encounter (Signed)
Do we have the status of this?

## 2014-03-09 NOTE — Telephone Encounter (Signed)
Vicente Males, I have called the office and [facility] and also refaxed the request.

## 2014-03-10 ENCOUNTER — Other Ambulatory Visit: Payer: Self-pay

## 2014-03-10 MED ORDER — PHOSPHATE LAXATIVE 2.7-7.2 GM/15ML PO SOLN: 15.0000 mL | Freq: Once | ORAL | Status: DC

## 2014-03-10 MED ORDER — PEG-KCL-NACL-NASULF-NA ASC-C 100 G PO SOLR: 1.0000 | Freq: Once | ORAL | Status: DC

## 2014-03-10 NOTE — Telephone Encounter (Signed)
Received the request back approved. Placed on Anna's desk.

## 2014-03-10 NOTE — Telephone Encounter (Signed)
[name] at [facility] is aware and routing to Clinical staff to schedule.

## 2014-03-10 NOTE — Telephone Encounter (Signed)
Spoke with Mariann Laster from [facility].  Pt is set up for TCS on 03/29/2014 @ 1015. Instructions have been mailed and Mariann Laster is aware.

## 2014-03-10 NOTE — Telephone Encounter (Signed)
Ok to hold Eliquis. Let's hold for 48 hours prior to procedure.

## 2014-03-12 NOTE — Progress Notes (Signed)
CC'ED TO PCP 

## 2014-03-24 NOTE — Patient Instructions (Signed)
[name]  03/24/2014   Your procedure is scheduled on:  03/29/2014  Report to [facility] at 8:45 AM.  Call this number if you have problems the morning of surgery: [phone]   Remember:   Do not eat food or drink liquids after midnight.   Take these medicines the morning of surgery with A SIP OF WATER: Zoloft, Coreg, Synthroid, Lisinopril, Ativan, Zofran, Protonix   Do not wear jewelry, make-up or nail polish.  Do not wear lotions, powders, or perfumes. You may wear deodorant.  Do not shave 48 hours prior to surgery. Men may shave face and neck.  Do not bring valuables to the hospital.  [facility] is not responsible for any belongings or valuables.               Contacts, dentures or bridgework may not be worn into surgery.  Leave suitcase in the car. After surgery it may be brought to your room.  For patients admitted to the hospital, discharge time is determined by your treatment team.               Patients discharged the day of surgery will not be allowed to drive home.  Name and phone number of your driver:   Special Instructions: N/A   Please read over the following fact sheets that you were given: Anesthesia Post-op Instructions   PATIENT INSTRUCTIONS POST-ANESTHESIA  IMMEDIATELY FOLLOWING SURGERY:  Do not drive or operate machinery for the first twenty four hours after surgery.  Do not make any important decisions for twenty four hours after surgery or while taking narcotic pain medications or sedatives.  If you develop intractable nausea and vomiting or a severe headache please notify your doctor immediately.  FOLLOW-UP:  Please make an appointment with your surgeon as instructed. You do not need to follow up with anesthesia unless specifically instructed to do so.  WOUND CARE INSTRUCTIONS (if applicable):  Keep a dry clean dressing on the anesthesia/puncture wound site if there is drainage.  Once the wound has quit draining you may leave it open to air.  Generally you  should leave the bandage intact for twenty four hours unless there is drainage.  If the epidural site drains for more than 36-48 hours please call the anesthesia department.  QUESTIONS?:  Please feel free to call your physician or the hospital operator if you have any questions, and they will be happy to assist you.      Colonoscopy A colonoscopy is an exam to look at the entire large intestine (colon). This exam can help find problems such as tumors, polyps, inflammation, and areas of bleeding. The exam takes about 1 hour.  LET [facility] CARE PROVIDER KNOW ABOUT:   Any allergies you have.  All medicines you are taking, including vitamins, herbs, eye drops, creams, and over-the-counter medicines.  Previous problems you or members of your family have had with the use of anesthetics.  Any blood disorders you have.  Previous surgeries you have had.  Medical conditions you have. RISKS AND COMPLICATIONS  Generally, this is a safe procedure. However, as with any procedure, complications can occur. Possible complications include:  Bleeding.  Tearing or rupture of the colon wall.  Reaction to medicines given during the exam.  Infection (rare). BEFORE THE PROCEDURE   Ask your health care provider about changing or stopping your regular medicines.  You may be prescribed an oral bowel prep. This involves drinking a large amount of medicated liquid, starting the  day before your procedure. The liquid will cause you to have multiple loose stools until your stool is almost clear or light green. This cleans out your colon in preparation for the procedure.  Do not eat or drink anything else once you have started the bowel prep, unless your health care provider tells you it is safe to do so.  Arrange for someone to drive you home after the procedure. PROCEDURE   You will be given medicine to help you relax (sedative).  You will lie on your side with your knees bent.  A long, flexible  tube with a light and camera on the end (colonoscope) will be inserted through the rectum and into the colon. The camera sends video back to a computer screen as it moves through the colon. The colonoscope also releases carbon dioxide gas to inflate the colon. This helps your health care provider see the area better.  During the exam, your health care provider may take a small tissue sample (biopsy) to be examined under a microscope if any abnormalities are found.  The exam is finished when the entire colon has been viewed. AFTER THE PROCEDURE   Do not drive for 24 hours after the exam.  You may have a small amount of blood in your stool.  You may pass moderate amounts of gas and have mild abdominal cramping or bloating. This is caused by the gas used to inflate your colon during the exam.  Ask when your test results will be ready and how you will get your results. Make sure you get your test results. Document Released: 01/05/2000 Document Revised: 10/28/2012 Document Reviewed: 09/14/2012 [facility] Patient Information 2015 [place], [place]. This information is not intended to replace advice given to you by your health care provider. Make sure you discuss any questions you have with your health care provider.

## 2014-03-25 ENCOUNTER — Encounter (HOSPITAL_COMMUNITY): Payer: Self-pay

## 2014-03-25 ENCOUNTER — Encounter (HOSPITAL_COMMUNITY)
Admission: RE | Admit: 2014-03-25 | Discharge: 2014-03-25 | Disposition: A | Discharge status: Home / Self Care | Payer: Medicaid Other | Source: Ambulatory Visit | Attending: Gastroenterology | Admitting: Gastroenterology

## 2014-03-25 DIAGNOSIS — Z800 Family history of malignant neoplasm of digestive organs: Secondary | ICD-10-CM | POA: Insufficient documentation

## 2014-03-25 DIAGNOSIS — Z01818 Encounter for other preprocedural examination: Secondary | ICD-10-CM | POA: Diagnosis present

## 2014-03-25 HISTORY — DX: Hypothyroidism, unspecified: E03.9

## 2014-03-25 LAB — CBC
HCT: 25.8 % — ABNORMAL LOW (ref 36.0–46.0)
Hemoglobin: 8.1 g/dL — ABNORMAL LOW (ref 12.0–15.0)
MCH: 26.9 pg (ref 26.0–34.0)
MCHC: 31.4 g/dL (ref 30.0–36.0)
MCV: 85.7 fL (ref 78.0–100.0)
PLATELETS: 281 10*3/uL (ref 150–400)
RBC: 3.01 MIL/uL — AB (ref 3.87–5.11)
RDW: 14.5 % (ref 11.5–15.5)
WBC: 5 10*3/uL (ref 4.0–10.5)

## 2014-03-25 LAB — BASIC METABOLIC PANEL
Anion gap: 7 (ref 5–15)
BUN: 22 mg/dL (ref 6–23)
CALCIUM: 9.1 mg/dL (ref 8.4–10.5)
CO2: 24 mmol/L (ref 19–32)
Chloride: 109 mmol/L (ref 96–112)
Creatinine, Ser: 1.16 mg/dL — ABNORMAL HIGH (ref 0.50–1.10)
GFR calc Af Amer: 59 mL/min — ABNORMAL LOW (ref >90)
GFR, EST NON AFRICAN AMERICAN: 50 mL/min — AB (ref >90)
Glucose, Bld: 89 mg/dL (ref 70–99)
Potassium: 4.2 mmol/L (ref 3.5–5.1)
SODIUM: 140 mmol/L (ref 135–145)

## 2014-03-26 NOTE — Progress Notes (Signed)
REVIEWED. Hb 8.1. PROCEED WITH TCS.

## 2014-03-28 NOTE — Pre-Procedure Instructions (Signed)
Dr. Oneida Alar & Dr. Patsey Berthold notified of hgb 8.1/hct 25.8%. No new orders received.

## 2014-03-29 ENCOUNTER — Ambulatory Visit (HOSPITAL_COMMUNITY): Payer: Medicaid Other | Admitting: Anesthesiology

## 2014-03-29 ENCOUNTER — Encounter (HOSPITAL_COMMUNITY)
Admission: RE | Disposition: A | Discharge status: Home / Self Care | Payer: Self-pay | Source: Ambulatory Visit | Attending: Gastroenterology

## 2014-03-29 ENCOUNTER — Ambulatory Visit (HOSPITAL_COMMUNITY)
Admission: RE | Admit: 2014-03-29 | Discharge: 2014-03-29 | Disposition: A | Discharge status: Home / Self Care | Payer: Medicaid Other | Source: Ambulatory Visit | Attending: Gastroenterology | Admitting: Gastroenterology

## 2014-03-29 ENCOUNTER — Encounter (HOSPITAL_COMMUNITY): Payer: Self-pay | Admitting: Anesthesiology

## 2014-03-29 DIAGNOSIS — I10 Essential (primary) hypertension: Secondary | ICD-10-CM | POA: Diagnosis not present

## 2014-03-29 DIAGNOSIS — D125 Benign neoplasm of sigmoid colon: Secondary | ICD-10-CM

## 2014-03-29 DIAGNOSIS — E039 Hypothyroidism, unspecified: Secondary | ICD-10-CM | POA: Diagnosis not present

## 2014-03-29 DIAGNOSIS — K5730 Diverticulosis of large intestine without perforation or abscess without bleeding: Secondary | ICD-10-CM | POA: Diagnosis not present

## 2014-03-29 DIAGNOSIS — I4891 Unspecified atrial fibrillation: Secondary | ICD-10-CM | POA: Insufficient documentation

## 2014-03-29 DIAGNOSIS — K219 Gastro-esophageal reflux disease without esophagitis: Secondary | ICD-10-CM | POA: Insufficient documentation

## 2014-03-29 DIAGNOSIS — E785 Hyperlipidemia, unspecified: Secondary | ICD-10-CM | POA: Diagnosis not present

## 2014-03-29 DIAGNOSIS — K648 Other hemorrhoids: Secondary | ICD-10-CM | POA: Insufficient documentation

## 2014-03-29 DIAGNOSIS — I5022 Chronic systolic (congestive) heart failure: Secondary | ICD-10-CM | POA: Diagnosis not present

## 2014-03-29 DIAGNOSIS — I2510 Atherosclerotic heart disease of native coronary artery without angina pectoris: Secondary | ICD-10-CM | POA: Insufficient documentation

## 2014-03-29 DIAGNOSIS — I255 Ischemic cardiomyopathy: Secondary | ICD-10-CM | POA: Insufficient documentation

## 2014-03-29 DIAGNOSIS — Z9089 Acquired absence of other organs: Secondary | ICD-10-CM | POA: Diagnosis not present

## 2014-03-29 DIAGNOSIS — D122 Benign neoplasm of ascending colon: Secondary | ICD-10-CM

## 2014-03-29 DIAGNOSIS — D128 Benign neoplasm of rectum: Secondary | ICD-10-CM

## 2014-03-29 DIAGNOSIS — K621 Rectal polyp: Secondary | ICD-10-CM | POA: Insufficient documentation

## 2014-03-29 DIAGNOSIS — D649 Anemia, unspecified: Secondary | ICD-10-CM

## 2014-03-29 DIAGNOSIS — Z79899 Other long term (current) drug therapy: Secondary | ICD-10-CM | POA: Diagnosis not present

## 2014-03-29 DIAGNOSIS — F17210 Nicotine dependence, cigarettes, uncomplicated: Secondary | ICD-10-CM | POA: Diagnosis not present

## 2014-03-29 DIAGNOSIS — Z8673 Personal history of transient ischemic attack (TIA), and cerebral infarction without residual deficits: Secondary | ICD-10-CM | POA: Insufficient documentation

## 2014-03-29 HISTORY — PX: COLONOSCOPY WITH PROPOFOL: SHX5780

## 2014-03-29 HISTORY — PX: POLYPECTOMY: SHX5525

## 2014-03-29 LAB — FERRITIN: Ferritin: 19 ng/mL (ref 10–291)

## 2014-03-29 SURGERY — COLONOSCOPY WITH PROPOFOL
Anesthesia: Monitor Anesthesia Care

## 2014-03-29 MED ORDER — ONDANSETRON HCL 4 MG/2ML IJ SOLN
4.0000 mg | Freq: Once | INTRAMUSCULAR | Status: AC
  Administered 2014-03-29: 4 mg via INTRAVENOUS

## 2014-03-29 MED ORDER — FENTANYL CITRATE 0.05 MG/ML IJ SOLN
INTRAMUSCULAR | Status: AC
  Filled 2014-03-29: qty 2

## 2014-03-29 MED ORDER — GLYCOPYRROLATE 0.2 MG/ML IJ SOLN
INTRAMUSCULAR | Status: AC
  Filled 2014-03-29: qty 1

## 2014-03-29 MED ORDER — ONDANSETRON HCL 4 MG/2ML IJ SOLN
INTRAMUSCULAR | Status: DC | PRN
  Administered 2014-03-29: 4 mg via INTRAVENOUS

## 2014-03-29 MED ORDER — ONDANSETRON HCL 4 MG/2ML IJ SOLN: 4.0000 mg | Freq: Once | INTRAMUSCULAR | Status: DC | PRN

## 2014-03-29 MED ORDER — FENTANYL CITRATE 0.05 MG/ML IJ SOLN
25.0000 ug | INTRAMUSCULAR | Status: AC
  Administered 2014-03-29 (×2): 25 ug via INTRAVENOUS

## 2014-03-29 MED ORDER — MIDAZOLAM HCL 2 MG/2ML IJ SOLN
1.0000 mg | INTRAMUSCULAR | Status: DC | PRN
  Administered 2014-03-29: 2 mg via INTRAVENOUS

## 2014-03-29 MED ORDER — PROPOFOL INFUSION 10 MG/ML OPTIME
INTRAVENOUS | Status: DC | PRN
  Administered 2014-03-29: 75 ug/kg/min via INTRAVENOUS

## 2014-03-29 MED ORDER — PROPOFOL 10 MG/ML IV BOLUS
INTRAVENOUS | Status: AC
  Filled 2014-03-29: qty 20

## 2014-03-29 MED ORDER — PROPOFOL 10 MG/ML IV BOLUS
INTRAVENOUS | Status: DC | PRN
  Administered 2014-03-29: 10 mg via INTRAVENOUS

## 2014-03-29 MED ORDER — STERILE WATER FOR IRRIGATION IR SOLN
Status: DC | PRN
  Administered 2014-03-29: 1000 mL

## 2014-03-29 MED ORDER — ONDANSETRON HCL 4 MG/2ML IJ SOLN
INTRAMUSCULAR | Status: AC
  Filled 2014-03-29: qty 2

## 2014-03-29 MED ORDER — GLYCOPYRROLATE 0.2 MG/ML IJ SOLN
0.2000 mg | Freq: Once | INTRAMUSCULAR | Status: AC
  Administered 2014-03-29: 0.2 mg via INTRAVENOUS

## 2014-03-29 MED ORDER — MIDAZOLAM HCL 2 MG/2ML IJ SOLN
INTRAMUSCULAR | Status: AC
  Filled 2014-03-29: qty 2

## 2014-03-29 MED ORDER — LACTATED RINGERS IV SOLN
INTRAVENOUS | Status: DC
  Administered 2014-03-29: 10:00:00 via INTRAVENOUS

## 2014-03-29 MED ORDER — FENTANYL CITRATE 0.05 MG/ML IJ SOLN
25.0000 ug | INTRAMUSCULAR | Status: DC | PRN
  Administered 2014-03-29 (×2): 25 ug via INTRAVENOUS
  Filled 2014-03-29: qty 2

## 2014-03-29 SURGICAL SUPPLY — 24 items
ELECT REM PT RETURN 9FT ADLT (ELECTROSURGICAL)
ELECTRODE REM PT RTRN 9FT ADLT (ELECTROSURGICAL) IMPLANT
FCP BXJMBJMB 240X2.8X (CUTTING FORCEPS)
FLOOR PAD 36X40 (MISCELLANEOUS) ×2
FORCEPS BIOP RAD 4 LRG CAP 4 (CUTTING FORCEPS) ×2 IMPLANT
FORCEPS BIOP RJ4 240 W/NDL (CUTTING FORCEPS)
FORCEPS BXJMBJMB 240X2.8X (CUTTING FORCEPS) IMPLANT
FORMALIN 10 PREFIL 20ML (MISCELLANEOUS) ×6 IMPLANT
INJECTOR/SNARE I SNARE (MISCELLANEOUS) IMPLANT
KIT CLEAN ENDO COMPLIANCE (KITS) ×2 IMPLANT
LUBRICANT JELLY 4.5OZ STERILE (MISCELLANEOUS) ×2 IMPLANT
MANIFOLD NEPTUNE II (INSTRUMENTS) ×2 IMPLANT
NEEDLE SCLEROTHERAPY 25GX240 (NEEDLE) IMPLANT
OVERTUBE ENDOCUFF GREEN (MISCELLANEOUS) ×2 IMPLANT
PAD FLOOR 36X40 (MISCELLANEOUS) ×1 IMPLANT
PROBE APC STR FIRE (PROBE) IMPLANT
PROBE INJECTION GOLD (MISCELLANEOUS)
PROBE INJECTION GOLD 7FR (MISCELLANEOUS) IMPLANT
SNARE ROTATE MED OVAL 20MM (MISCELLANEOUS) ×2 IMPLANT
SNARE SHORT THROW 13M SML OVAL (MISCELLANEOUS) IMPLANT
SYR 50ML LL SCALE MARK (SYRINGE) ×2 IMPLANT
TRAP SPECIMEN MUCOUS 40CC (MISCELLANEOUS) ×2 IMPLANT
TUBING IRRIGATION ENDOGATOR (MISCELLANEOUS) ×2 IMPLANT
WATER STERILE IRR 1000ML POUR (IV SOLUTION) ×2 IMPLANT

## 2014-03-29 NOTE — Transfer of Care (Signed)
Immediate Anesthesia Transfer of Care Note  Patient: [name]  Procedure(s) Performed: Procedure(s) with comments: COLONOSCOPY WITH PROPOFOL (N/A) - Cecum time in 1101  time out   1127  total time 26 minutes POLYPECTOMY (N/A) - ascending colon, sigmoid colon x4, rectal x3  Patient Location: PACU  Anesthesia Type:MAC  Level of Consciousness: awake  Airway & Oxygen Therapy: Patient Spontanous Breathing  Post-op Assessment: Report given to RN  Post vital signs: Reviewed  Last Vitals:  Filed Vitals:   03/29/14 1040  BP: 112/71  Pulse:   Temp:   Resp: 16    Complications: No apparent anesthesia complications

## 2014-03-29 NOTE — Anesthesia Postprocedure Evaluation (Signed)
  Anesthesia Post-op Note  Patient: [name]  Procedure(s) Performed: Procedure(s) with comments: COLONOSCOPY WITH PROPOFOL (N/A) - Cecum time in 1101  time out   1127  total time 26 minutes POLYPECTOMY (N/A) - ascending colon, sigmoid colon x4, rectal x3  Patient Location: PACU  Anesthesia Type:MAC  Level of Consciousness: awake, alert  and oriented  Airway and Oxygen Therapy: Patient Spontanous Breathing and Patient connected to nasal cannula oxygen  Post-op Pain: none  Post-op Assessment: Post-op Vital signs reviewed, Patient's Cardiovascular Status Stable, Respiratory Function Stable, Patent Airway and No signs of Nausea or vomiting  Post-op Vital Signs: Reviewed and stable  Last Vitals:  Filed Vitals:   03/29/14 1040  BP: 112/71  Pulse:   Temp:   Resp: 16    Complications: No apparent anesthesia complications

## 2014-03-29 NOTE — Anesthesia Preprocedure Evaluation (Signed)
Anesthesia Evaluation  Patient identified by MRN, date of birth, ID band Patient awake    Reviewed: Allergy & Precautions, H&P , NPO status , Patient's Chart, lab work & pertinent test results, reviewed documented beta blocker date and time   History of Anesthesia Complications (+) PONV and history of anesthetic complications  Airway Mallampati: II  TM Distance: >3 FB     Dental  (+) Edentulous Upper   Pulmonary Current Smoker,  breath sounds clear to auscultation        Cardiovascular hypertension, Pt. on medications and Pt. on home beta blockers + CAD and +CHF + dysrhythmias Atrial Fibrillation Rhythm:Regular Rate:Normal     Neuro/Psych PSYCHIATRIC DISORDERS Anxiety CVA    GI/Hepatic GERD-  Medicated,(+)     substance abuse  alcohol use,   Endo/Other  Hypothyroidism   Renal/GU Renal disease     Musculoskeletal   Abdominal   Peds  Hematology   Anesthesia Other Findings   Reproductive/Obstetrics                             Anesthesia Physical Anesthesia Plan  ASA: III  Anesthesia Plan: MAC   Post-op Pain Management:    Induction: Intravenous  Airway Management Planned: Simple Face Mask  Additional Equipment:   Intra-op Plan:   Post-operative Plan:   Informed Consent: I have reviewed the patients History and Physical, chart, labs and discussed the procedure including the risks, benefits and alternatives for the proposed anesthesia with the patient or authorized representative who has indicated his/her understanding and acceptance.     Plan Discussed with:   Anesthesia Plan Comments:         Anesthesia Quick Evaluation

## 2014-03-29 NOTE — Discharge Instructions (Signed)
You had 8 polyp removed. NO DEFINITE SOURCE FOR YOUR LOW BLOOD COUNT WAS IDENTIFIED. You have small internal hemorrhoids and diverticulosis IN YOUR COLON.   I WILL CHECK YOUR IRON STORES TODAY.  FOLLOW A HIGH FIBER DIET. AVOID ITEMS THAT CAUSE BLOATING. SEE INFO BELOW.  YOUR BIOPSY RESULTS WILL BE AVAILABLE IN MY CHART AFTER MAR 10      OR MY OFFICE WILL CONTACT YOU IN 10-14 DAYS WITH YOUR RESULTS.   Next colonoscopy in 1-3 years.   Colonoscopy Care After Read the instructions outlined below and refer to this sheet in the next week. These discharge instructions provide you with general information on caring for yourself after you leave the hospital. While your treatment has been planned according to the most current medical practices available, unavoidable complications occasionally occur. If you have any problems or questions after discharge, call DR. Maximum Reiland, [phone].  ACTIVITY  You may resume your regular activity, but move at a slower pace for the next 24 hours.   Take frequent rest periods for the next 24 hours.   Walking will help get rid of the air and reduce the bloated feeling in your belly (abdomen).   No driving for 24 hours (because of the medicine (anesthesia) used during the test).   You may shower.   Do not sign any important legal documents or operate any machinery for 24 hours (because of the anesthesia used during the test).    NUTRITION  Drink plenty of fluids.   You may resume your normal diet as instructed by your doctor.   Begin with a light meal and progress to your normal diet. Heavy or fried foods are harder to digest and may make you feel sick to your stomach (nauseated).   Avoid alcoholic beverages for 24 hours or as instructed.    MEDICATIONS  You may resume your normal medications.   WHAT YOU CAN EXPECT TODAY  Some feelings of bloating in the abdomen.   Passage of more gas than usual.   Spotting of blood in your stool or on the  toilet paper  .  IF YOU HAD POLYPS REMOVED DURING THE COLONOSCOPY:  Eat a soft diet IF YOU HAVE NAUSEA, BLOATING, ABDOMINAL PAIN, OR VOMITING.    FINDING OUT THE RESULTS OF YOUR TEST Not all test results are available during your visit. DR. Oneida Alar WILL CALL YOU WITHIN 14 DAYS OF YOUR PROCEDUE WITH YOUR RESULTS. Do not assume everything is normal if you have not heard from DR. Duc Crocket, CALL HER OFFICE AT [phone].  SEEK IMMEDIATE MEDICAL ATTENTION AND CALL THE OFFICE: [phone] IF:  You have more than a spotting of blood in your stool.   Your belly is swollen (abdominal distention).   You are nauseated or vomiting.   You have a temperature over 101F.   You have abdominal pain or discomfort that is severe or gets worse throughout the day.  Polyps, Colon  A polyp is extra tissue that grows inside your body. Colon polyps grow in the large intestine. The large intestine, also called the colon, is part of your digestive system. It is a long, hollow tube at the end of your digestive tract where your body makes and stores stool. Most polyps are not dangerous. They are benign. This means they are not cancerous. But over time, some types of polyps can turn into cancer. Polyps that are smaller than a pea are usually not harmful. But larger polyps could someday become or may already be cancerous.  To be safe, doctors remove all polyps and test them.   WHO GETS POLYPS? Anyone can get polyps, but certain people are more likely than others. You may have a greater chance of getting polyps if:  You are over 50.   You have had polyps before.   Someone in your family has had polyps.   Someone in your family has had cancer of the large intestine.   Find out if someone in your family has had polyps. You may also be more likely to get polyps if you:   Eat a lot of fatty foods   Smoke   Drink alcohol   Do not exercise  Eat too much   TREATMENT  The caregiver will remove the polyp during  sigmoidoscopy or colonoscopy.  PREVENTION There is not one sure way to prevent polyps. You might be able to lower your risk of getting them if you:  Eat more fruits and vegetables and less fatty food.   Do not smoke.   Avoid alcohol.   Exercise every day.   Lose weight if you are overweight.   Eating more calcium and folate can also lower your risk of getting polyps. Some foods that are rich in calcium are milk, cheese, and broccoli. Some foods that are rich in folate are chickpeas, kidney beans, and spinach.   High-Fiber Diet A high-fiber diet changes your normal diet to include more whole grains, legumes, fruits, and vegetables. Changes in the diet involve replacing refined carbohydrates with unrefined foods. The calorie level of the diet is essentially unchanged. The Dietary Reference Intake (recommended amount) for adult males is 38 grams per day. For adult females, it is 25 grams per day. Pregnant and lactating women should consume 28 grams of fiber per day. Fiber is the intact part of a plant that is not broken down during digestion. Functional fiber is fiber that has been isolated from the plant to provide a beneficial effect in the body. PURPOSE  Increase stool bulk.   Ease and regulate bowel movements.   Lower cholesterol.  INDICATIONS THAT YOU NEED MORE FIBER  Constipation and hemorrhoids.   Uncomplicated diverticulosis (intestine condition) and irritable bowel syndrome.   Weight management.   As a protective measure against hardening of the arteries (atherosclerosis), diabetes, and cancer.   GUIDELINES FOR INCREASING FIBER IN THE DIET  Start adding fiber to the diet slowly. A gradual increase of about 5 more grams (2 slices of whole-wheat bread, 2 servings of most fruits or vegetables, or 1 bowl of high-fiber cereal) per day is best. Too rapid an increase in fiber may result in constipation, flatulence, and bloating.   Drink enough water and fluids to keep your  urine clear or pale yellow. Water, juice, or caffeine-free drinks are recommended. Not drinking enough fluid may cause constipation.   Eat a variety of high-fiber foods rather than one type of fiber.   Try to increase your intake of fiber through using high-fiber foods rather than fiber pills or supplements that contain small amounts of fiber.   The goal is to change the types of food eaten. Do not supplement your present diet with high-fiber foods, but replace foods in your present diet.  INCLUDE A VARIETY OF FIBER SOURCES  Replace refined and processed grains with whole grains, canned fruits with fresh fruits, and incorporate other fiber sources. White rice, white breads, and most bakery goods contain little or no fiber.   Brown whole-grain rice, buckwheat oats, and many fruits  and vegetables are all good sources of fiber. These include: broccoli, Brussels sprouts, cabbage, cauliflower, beets, sweet potatoes, white potatoes (skin on), carrots, tomatoes, eggplant, squash, berries, fresh fruits, and dried fruits.   Cereals appear to be the richest source of fiber. Cereal fiber is found in whole grains and bran. Bran is the fiber-rich outer coat of cereal grain, which is largely removed in refining. In whole-grain cereals, the bran remains. In breakfast cereals, the largest amount of fiber is found in those with "bran" in their names. The fiber content is sometimes indicated on the label.   You may need to include additional fruits and vegetables each day.   In baking, for 1 cup white flour, you may use the following substitutions:   1 cup whole-wheat flour minus 2 tablespoons.   1/2 cup white flour plus 1/2 cup whole-wheat flour.   Diverticulosis Diverticulosis is a common condition that develops when small pouches (diverticula) form in the wall of the colon. The risk of diverticulosis increases with age. It happens more often in people who eat a low-fiber diet. Most individuals with  diverticulosis have no symptoms. Those individuals with symptoms usually experience belly (abdominal) pain, constipation, or loose stools (diarrhea).  HOME CARE INSTRUCTIONS  Increase the amount of fiber in your diet as directed by your caregiver or dietician. This may reduce symptoms of diverticulosis.   Drink at least 6 to 8 glasses of water each day to prevent constipation.   Try not to strain when you have a bowel movement.   Avoiding nuts and seeds to prevent complications is still an uncertain benefit.       FOODS HAVING HIGH FIBER CONTENT INCLUDE:  Fruits. Apple, peach, pear, tangerine, raisins, prunes.   Vegetables. Brussels sprouts, asparagus, broccoli, cabbage, carrot, cauliflower, romaine lettuce, spinach, summer squash, tomato, winter squash, zucchini.   Starchy Vegetables. Baked beans, kidney beans, lima beans, split peas, lentils, potatoes (with skin).   Grains. Whole wheat bread, brown rice, bran flake cereal, plain oatmeal, white rice, shredded wheat, bran muffins.    SEEK IMMEDIATE MEDICAL CARE IF:  You develop increasing pain or severe bloating.   You have an oral temperature above 101F.   You develop vomiting or bowel movements that are bloody or black.   Hemorrhoids Hemorrhoids are dilated (enlarged) veins around the rectum. Sometimes clots will form in the veins. This makes them swollen and painful. These are called thrombosed hemorrhoids. Causes of hemorrhoids include:  Constipation.   Straining to have a bowel movement.   HEAVY LIFTING HOME CARE INSTRUCTIONS  Eat a well balanced diet and drink 6 to 8 glasses of water every day to avoid constipation. You may also use a bulk laxative.   Avoid straining to have bowel movements.   Keep anal area dry and clean.   Do not use a donut shaped pillow or sit on the toilet for long periods. This increases blood pooling and pain.   Move your bowels when your body has the urge; this will require less  straining and will decrease pain and pressure.

## 2014-03-29 NOTE — Op Note (Signed)
[facility] [address] [place], [address]   COLONOSCOPY PROCEDURE REPORT  PATIENT: [name], [name]  MR#: [id number] BIRTHDATE: Dec 18, 1955 , 61  yrs. old GENDER: female ENDOSCOPIST: Danie Binder, MD REFERRED TG:[id number] Funches, MD PROCEDURE DATE:  2014/03/31 PROCEDURE:   Colonoscopy with cold biopsy polypectomy and Colonoscopy with snare polypectomy INDICATIONS:anemia, non-specific. MEDICATIONS: Monitored anesthesia care  DESCRIPTION OF PROCEDURE:    Physical exam was performed.  Informed consent was obtained from the patient after explaining the benefits, risks, and alternatives to procedure.  The patient was connected to monitor and placed in left lateral position. Continuous oxygen was provided by nasal cannula and IV medicine administered through an indwelling cannula.  After administration of sedation and rectal exam, the patients rectum was intubated and the     colonoscope was advanced under direct visualization to the ileum.  The scope was removed slowly by carefully examining the color, texture, anatomy, and integrity mucosa on the way out.  The patient was recovered in endoscopy and discharged home in satisfactory condition.    COLON FINDINGS: The examined terminal ileum appeared to be normal. , Four sessile polyps ranging from 3 to 42mm in size were found in the rectum and sigmoid colon.  A polypectomy was performed with cold forceps.  , Four sessile polyps ranging from 6 to 66mm in size were found in the rectum and ascending colon.  A polypectomy was performed using snare cautery.  , There was severe diverticulosis noted in the transverse colon and right colon with associated luminal narrowing and muscular hypertrophy.  , and Small internal hemorrhoids were found.  PREP QUALITY: good.  CECAL W/D TIME: 25       minutes COMPLICATIONS: None  ENDOSCOPIC IMPRESSION: 1.   NO DEFINITE SOURCE FOR ANEMIA IDENTIFIED 2.   8 COLON POLYPS REMOVED 3.    Severe diverticulosis noted the transverse colon and right colon 4.   Small internal hemorrhoids  RECOMMENDATIONS: FOLLOW A HIGH FIBER DIET. AWAIT BIOPSY. Next colonoscopy in 1-3 years.    _______________________________ Lorrin MaisDanie Binder, MD 2014-03-31 3:42 PM   CPT CODES: ICD CODES:  The ICD and CPT codes recommended by this software are interpretations from the data that the clinical staff has captured with the software.  The verification of the translation of this report to the ICD and CPT codes and modifiers is the sole responsibility of the health care institution and practicing physician where this report was generated.  [employer] [place]. will not be held responsible for the validity of the ICD and CPT codes included on this report.  AMA assumes no liability for data contained or not contained herein. CPT is a Designer, television/film set of the [employer].

## 2014-03-29 NOTE — H&P (Signed)
Primary Care Physician:  [name], MD Primary Gastroenterologist:  Dr. Oneida Alar  Pre-Procedure History & Physical: HPI:  [name] is a 59 y.o. female here for Anemia  Past Medical History  Diagnosis Date  . Diverticulosis   . Pancreatitis 1980, 2015    in the setting of ETOH  . chronic systolic heart failure     a. ECHO (05/08/13): EF 20-25%, diff HK with akinesis of basal inferior wall, grade 1 DD, trivial MR, trivial TR b) RHC (05/06/13): RA 7, RV 30/2, PA 31/19 (24), PCWP 10, Fick CO/CI: 2.9/1.74, Thermo CO/Ci: 2.4/1.4, PA sat 53%  . Ischemic cardiomyopathy   . CAD (coronary artery disease)     a. LHC (04/2013): Chronically occluded LAD, moderate dz in large OM1 and severe dz and small posterior lateral vessel    . A-fib 2015  . Suicidal overdose 10/03/13    Overdoes on Beta Blockers & Xarelto [facility])  . Anxiety 2015   . Psoriasis 1968  . GERD (gastroesophageal reflux disease) 2015   . Hyperlipidemia 2015  . Hypertension 2015  . ETOH abuse 1981    started abusing alcohol at age 66   . Dysrhythmia   . Stroke 2011    left side weakness, minimal  . Hypothyroidism   . PONV (postoperative nausea and vomiting)     Past Surgical History  Procedure Laterality Date  . Colostomy takedown  2007  . Exploratory laparotomy with colon resection, colostomy  2007  . Esophagogastroduodenoscopy (egd) with propofol N/A 11/22/2013    Dr. Oneida Alar: gastritis and duodenitis, negative H.pylori  . Esophageal biopsy N/A 11/22/2013    Procedure: GASTRIC BIOPSIES;  Surgeon: Danie Binder, MD;  Location: [facility];  Service: Endoscopy;  Laterality: N/A;  . Left and right heart catheterization with coronary angiogram N/A 05/10/2013    Procedure: LEFT AND RIGHT HEART CATHETERIZATION WITH CORONARY ANGIOGRAM;  Surgeon: [name], MD;  Location: [facility] CATH LAB;  Service: Cardiovascular;  Laterality: N/A;  . Left heart catheterization with coronary angiogram N/A 09/03/2013    Procedure: LEFT HEART  CATHETERIZATION WITH CORONARY ANGIOGRAM;  Surgeon: Sinclair Grooms, MD;  Location: [facility] CATH LAB;  Service: Cardiovascular;  Laterality: N/A;    Prior to Admission medications   Medication Sig Start Date End Date Taking? Authorizing Provider  acetaminophen (TYLENOL) 325 MG tablet Take 650 mg by mouth every 6 (six) hours as needed for fever or headache.   Yes Historical Provider, MD  ammonium lactate (LAC-HYDRIN) 12 % cream Apply topically as needed for dry skin. Apply to elbows 10/22/13  Yes Josalyn Funches, MD  atorvastatin (LIPITOR) 80 MG tablet Take 1 tablet (80 mg total) by mouth daily. 01/31/14  Yes Larey Dresser, MD  carvedilol (COREG) 6.25 MG tablet Take 1 tablet (6.25 mg total) by mouth 2 (two) times daily with a meal. Hold for heart rate less than 60 12/30/13  Yes Amy D Clegg, NP  furosemide (LASIX) 20 MG tablet Take 1 tablet (20 mg total) by mouth as needed. Take 20 mg for weight 128 pounds or greater. 10/20/13  Yes Amy D Clegg, NP  levothyroxine (SYNTHROID, LEVOTHROID) 25 MCG tablet Take 0.125 mcg by mouth daily before breakfast.   Yes Historical Provider, MD  lisinopril (PRINIVIL,ZESTRIL) 5 MG tablet Take 1 tablet (5 mg total) by mouth daily. 01/27/14  Yes Larey Dresser, MD  LORazepam (ATIVAN) 0.5 MG tablet Take 1 tablet (0.5 mg total) by mouth 2 (two) times daily. 12/30/13  Yes Josalyn Funches, MD  pantoprazole (PROTONIX) 40 MG tablet TAKE 1 TABLET BY MOUTH TWICE DAILY BEFORE A MEAL. 02/25/14  Yes Carlis Stable, NP  peg 3350 powder (MOVIPREP) 100 G SOLR Take 1 kit (200 g total) by mouth once. 03/10/14 04/09/14 Yes Danie Binder, MD  phosphate laxative (FLEET) 2.7-7.2 GM/15ML solution Take 15 mLs by mouth once. 03/10/14  Yes Danie Binder, MD  sertraline (ZOLOFT) 50 MG tablet Take 1 tablet (50 mg total) by mouth daily. 12/30/13  Yes Boykin Nearing, MD  apixaban (ELIQUIS) 5 MG TABS tablet Take 1 tablet (5 mg total) by mouth 2 (two) times daily. 12/30/13   Amy D Ninfa Meeker, NP  nitroGLYCERIN  (NITROSTAT) 0.3 MG SL tablet Place 0.3 mg under the tongue every 5 (five) minutes as needed for chest pain.    Historical Provider, MD  ondansetron (ZOFRAN) 4 MG tablet Take 1 tablet (4 mg total) by mouth every 8 (eight) hours as needed for nausea or vomiting. 11/18/13   Orvil Feil, NP    Allergies as of 03/10/2014 - Review Complete 02/28/2014  Allergen Reaction Noted  . Morphine and related Other (See Comments) 09/24/2013  . Penicillins Anaphylaxis and Rash 05/06/2013    Family History  Problem Relation Age of Onset  . CAD Father 68    MI  . CAD Mother 11    MI  . Alcohol abuse Mother   . CAD Sister 74    Stent  . Diabetes Sister   . CAD Brother 41    MI  . Cancer Neg Hx   . Colon cancer Mother     in her 3s    History   Social History  . Marital Status: Single    Spouse Name: N/A  . Number of Children: 2  . Years of Education: 10   Occupational History  . Unemployed     Social History Main Topics  . Smoking status: Current Some Day Smoker -- 0.25 packs/day for 47 years    Types: Cigarettes  . Smokeless tobacco: Never Used     Comment: she smokes occasionally, trying to quit  . Alcohol Use: No     Comment: last drink in 03/2013   . Drug Use: No  . Sexual Activity: No   Other Topics Concern  . Not on file   Social History Narrative   Lived previously with her spouse in an [place].     Son and daughter   Daughter in [place]   Son in [place]   Two grandchildren.       Presently in an assisted living facility [facility], moved in 10/13/13.           Review of Systems: See HPI, otherwise negative ROS   Physical Exam: BP 120/63 mmHg  Pulse 73  Temp(Src) 98 F (36.7 C) (Oral)  Resp 15  SpO2 99%  LMP 01/22/1988 (Approximate) General:   Alert,  pleasant and cooperative in NAD Head:  Normocephalic and atraumatic. Neck:  Supple; Lungs:  Clear throughout to auscultation.    Heart:  Regular rate and rhythm. Abdomen:  Soft, nontender and  nondistended. Normal bowel sounds, without guarding, and without rebound.   Neurologic:  Alert and  oriented x4;  grossly normal neurologically.  Impression/Plan:   Anemia  PLAN:  1. TCS TODAY

## 2014-03-30 ENCOUNTER — Encounter (HOSPITAL_COMMUNITY): Payer: Self-pay | Admitting: Gastroenterology

## 2014-04-08 ENCOUNTER — Telehealth (HOSPITAL_COMMUNITY): Payer: Self-pay | Admitting: Vascular Surgery

## 2014-04-08 ENCOUNTER — Telehealth: Payer: Self-pay | Admitting: *Deleted

## 2014-04-08 NOTE — Telephone Encounter (Signed)
Patient c/o increased work of breathing while lying down, weight actually down, and patient denies any physical swelling/edema.  Was supposed to follow up with [place] a month ago.  Added on to schedule on Monday.  Aware and agreeable.  Renee Pain

## 2014-04-08 NOTE — Telephone Encounter (Signed)
Left voice message to return call 

## 2014-04-08 NOTE — Telephone Encounter (Signed)
-----   Message from Boykin Nearing, MD sent at 03/30/2014  1:05 PM EST ----- Pathology from colonoscopy polyps- NO cancer.

## 2014-04-08 NOTE — Telephone Encounter (Signed)
[facility] called they would like to talk to nurse Meagan pt has some concerns .Marland Kitchen Please advise

## 2014-04-09 ENCOUNTER — Encounter (HOSPITAL_COMMUNITY): Payer: Self-pay | Admitting: *Deleted

## 2014-04-09 ENCOUNTER — Inpatient Hospital Stay (HOSPITAL_COMMUNITY)
Admission: EM | Admit: 2014-04-09 | Discharge: 2014-04-10 | DRG: 291 | Disposition: A | Discharge status: Home / Self Care | Payer: Medicaid Other | Attending: Family Medicine | Admitting: Family Medicine

## 2014-04-09 ENCOUNTER — Emergency Department (HOSPITAL_COMMUNITY): Payer: Medicaid Other

## 2014-04-09 DIAGNOSIS — J9601 Acute respiratory failure with hypoxia: Secondary | ICD-10-CM | POA: Diagnosis present

## 2014-04-09 DIAGNOSIS — F1010 Alcohol abuse, uncomplicated: Secondary | ICD-10-CM

## 2014-04-09 DIAGNOSIS — Z833 Family history of diabetes mellitus: Secondary | ICD-10-CM

## 2014-04-09 DIAGNOSIS — R0600 Dyspnea, unspecified: Secondary | ICD-10-CM | POA: Diagnosis not present

## 2014-04-09 DIAGNOSIS — D649 Anemia, unspecified: Secondary | ICD-10-CM | POA: Diagnosis present

## 2014-04-09 DIAGNOSIS — I255 Ischemic cardiomyopathy: Secondary | ICD-10-CM | POA: Diagnosis present

## 2014-04-09 DIAGNOSIS — E785 Hyperlipidemia, unspecified: Secondary | ICD-10-CM | POA: Diagnosis present

## 2014-04-09 DIAGNOSIS — E119 Type 2 diabetes mellitus without complications: Secondary | ICD-10-CM | POA: Diagnosis present

## 2014-04-09 DIAGNOSIS — Z8249 Family history of ischemic heart disease and other diseases of the circulatory system: Secondary | ICD-10-CM

## 2014-04-09 DIAGNOSIS — F17210 Nicotine dependence, cigarettes, uncomplicated: Secondary | ICD-10-CM | POA: Diagnosis present

## 2014-04-09 DIAGNOSIS — K219 Gastro-esophageal reflux disease without esophagitis: Secondary | ICD-10-CM | POA: Diagnosis present

## 2014-04-09 DIAGNOSIS — I509 Heart failure, unspecified: Secondary | ICD-10-CM

## 2014-04-09 DIAGNOSIS — I5023 Acute on chronic systolic (congestive) heart failure: Secondary | ICD-10-CM | POA: Diagnosis not present

## 2014-04-09 DIAGNOSIS — Z720 Tobacco use: Secondary | ICD-10-CM | POA: Diagnosis present

## 2014-04-09 DIAGNOSIS — I10 Essential (primary) hypertension: Secondary | ICD-10-CM | POA: Diagnosis present

## 2014-04-09 DIAGNOSIS — I2510 Atherosclerotic heart disease of native coronary artery without angina pectoris: Secondary | ICD-10-CM | POA: Diagnosis present

## 2014-04-09 DIAGNOSIS — Z800 Family history of malignant neoplasm of digestive organs: Secondary | ICD-10-CM

## 2014-04-09 DIAGNOSIS — Z811 Family history of alcohol abuse and dependence: Secondary | ICD-10-CM | POA: Diagnosis not present

## 2014-04-09 DIAGNOSIS — F1011 Alcohol abuse, in remission: Secondary | ICD-10-CM | POA: Diagnosis present

## 2014-04-09 DIAGNOSIS — E039 Hypothyroidism, unspecified: Secondary | ICD-10-CM | POA: Diagnosis present

## 2014-04-09 DIAGNOSIS — I480 Paroxysmal atrial fibrillation: Secondary | ICD-10-CM | POA: Diagnosis present

## 2014-04-09 DIAGNOSIS — F411 Generalized anxiety disorder: Secondary | ICD-10-CM | POA: Diagnosis present

## 2014-04-09 DIAGNOSIS — Z8673 Personal history of transient ischemic attack (TIA), and cerebral infarction without residual deficits: Secondary | ICD-10-CM | POA: Diagnosis not present

## 2014-04-09 DIAGNOSIS — D509 Iron deficiency anemia, unspecified: Secondary | ICD-10-CM | POA: Diagnosis present

## 2014-04-09 DIAGNOSIS — I5041 Acute combined systolic (congestive) and diastolic (congestive) heart failure: Secondary | ICD-10-CM

## 2014-04-09 LAB — COMPREHENSIVE METABOLIC PANEL
ALT: 9 U/L (ref 0–35)
ANION GAP: 8 (ref 5–15)
AST: 15 U/L (ref 0–37)
Albumin: 4 g/dL (ref 3.5–5.2)
Alkaline Phosphatase: 74 U/L (ref 39–117)
BUN: 24 mg/dL — ABNORMAL HIGH (ref 6–23)
CHLORIDE: 111 mmol/L (ref 96–112)
CO2: 23 mmol/L (ref 19–32)
CREATININE: 1.02 mg/dL (ref 0.50–1.10)
Calcium: 9 mg/dL (ref 8.4–10.5)
GFR calc non Af Amer: 59 mL/min — ABNORMAL LOW (ref >90)
GFR, EST AFRICAN AMERICAN: 68 mL/min — AB (ref >90)
GLUCOSE: 120 mg/dL — AB (ref 70–99)
POTASSIUM: 3.6 mmol/L (ref 3.5–5.1)
Sodium: 142 mmol/L (ref 135–145)
Total Bilirubin: 0.6 mg/dL (ref 0.3–1.2)
Total Protein: 7.6 g/dL (ref 6.0–8.3)

## 2014-04-09 LAB — CBC WITH DIFFERENTIAL/PLATELET
BASOS ABS: 0.1 10*3/uL (ref 0.0–0.1)
BASOS PCT: 1 % (ref 0–1)
EOS ABS: 0.2 10*3/uL (ref 0.0–0.7)
Eosinophils Relative: 3 % (ref 0–5)
HCT: 26 % — ABNORMAL LOW (ref 36.0–46.0)
HEMOGLOBIN: 8.1 g/dL — AB (ref 12.0–15.0)
Lymphocytes Relative: 19 % (ref 12–46)
Lymphs Abs: 1.4 10*3/uL (ref 0.7–4.0)
MCH: 26.3 pg (ref 26.0–34.0)
MCHC: 31.2 g/dL (ref 30.0–36.0)
MCV: 84.4 fL (ref 78.0–100.0)
MONOS PCT: 8 % (ref 3–12)
Monocytes Absolute: 0.6 10*3/uL (ref 0.1–1.0)
Neutro Abs: 5.3 10*3/uL (ref 1.7–7.7)
Neutrophils Relative %: 69 % (ref 43–77)
Platelets: 326 10*3/uL (ref 150–400)
RBC: 3.08 MIL/uL — AB (ref 3.87–5.11)
RDW: 14.2 % (ref 11.5–15.5)
WBC: 7.6 10*3/uL (ref 4.0–10.5)

## 2014-04-09 LAB — TSH: TSH: 4.9 u[IU]/mL — AB (ref 0.350–4.500)

## 2014-04-09 LAB — BRAIN NATRIURETIC PEPTIDE: B NATRIURETIC PEPTIDE 5: 1385 pg/mL — AB (ref 0.0–100.0)

## 2014-04-09 LAB — TROPONIN I: Troponin I: <0.03 ng/mL (ref <0.031)

## 2014-04-09 MED ORDER — SODIUM CHLORIDE 0.9 % IV BOLUS (SEPSIS)
250.0000 mL | Freq: Once | INTRAVENOUS | Status: AC
  Administered 2014-04-09: 250 mL via INTRAVENOUS

## 2014-04-09 MED ORDER — IPRATROPIUM BROMIDE 0.02 % IN SOLN
0.5000 mg | Freq: Once | RESPIRATORY_TRACT | Status: AC
  Administered 2014-04-09: 0.5 mg via RESPIRATORY_TRACT
  Filled 2014-04-09: qty 2.5

## 2014-04-09 MED ORDER — LISINOPRIL 5 MG PO TABS
5.0000 mg | ORAL_TABLET | Freq: Every day | ORAL | Status: DC
  Filled 2014-04-09: qty 1

## 2014-04-09 MED ORDER — POTASSIUM CHLORIDE CRYS ER 20 MEQ PO TBCR
20.0000 meq | EXTENDED_RELEASE_TABLET | Freq: Every day | ORAL | Status: DC
  Administered 2014-04-09: 20 meq via ORAL
  Filled 2014-04-09: qty 1

## 2014-04-09 MED ORDER — CETYLPYRIDINIUM CHLORIDE 0.05 % MT LIQD
7.0000 mL | Freq: Two times a day (BID) | OROMUCOSAL | Status: DC
  Administered 2014-04-09 – 2014-04-10 (×3): 7 mL via OROMUCOSAL

## 2014-04-09 MED ORDER — LEVOTHYROXINE SODIUM 25 MCG PO TABS
12.5000 ug | ORAL_TABLET | Freq: Every day | ORAL | Status: DC
  Administered 2014-04-09 – 2014-04-10 (×2): 12.5 ug via ORAL
  Filled 2014-04-09 (×2): qty 1

## 2014-04-09 MED ORDER — ATORVASTATIN CALCIUM 40 MG PO TABS
80.0000 mg | ORAL_TABLET | Freq: Every day | ORAL | Status: DC
  Administered 2014-04-09: 80 mg via ORAL
  Filled 2014-04-09: qty 2

## 2014-04-09 MED ORDER — FUROSEMIDE 10 MG/ML IJ SOLN
40.0000 mg | Freq: Once | INTRAMUSCULAR | Status: AC
  Administered 2014-04-09: 40 mg via INTRAVENOUS
  Filled 2014-04-09: qty 4

## 2014-04-09 MED ORDER — SODIUM CHLORIDE 0.9 % IV SOLN: 250.0000 mL | INTRAVENOUS | Status: DC | PRN

## 2014-04-09 MED ORDER — SERTRALINE HCL 50 MG PO TABS
50.0000 mg | ORAL_TABLET | Freq: Every day | ORAL | Status: DC
  Administered 2014-04-09 – 2014-04-10 (×2): 50 mg via ORAL
  Filled 2014-04-09 (×2): qty 1

## 2014-04-09 MED ORDER — SODIUM CHLORIDE 0.9 % IJ SOLN: 3.0000 mL | INTRAMUSCULAR | Status: DC | PRN

## 2014-04-09 MED ORDER — ACETAMINOPHEN 325 MG PO TABS: 650.0000 mg | ORAL_TABLET | ORAL | Status: DC | PRN

## 2014-04-09 MED ORDER — ALBUTEROL SULFATE (2.5 MG/3ML) 0.083% IN NEBU
5.0000 mg | INHALATION_SOLUTION | Freq: Once | RESPIRATORY_TRACT | Status: AC
  Administered 2014-04-09: 5 mg via RESPIRATORY_TRACT
  Filled 2014-04-09: qty 6

## 2014-04-09 MED ORDER — ACETAMINOPHEN 325 MG PO TABS: 650.0000 mg | ORAL_TABLET | Freq: Four times a day (QID) | ORAL | Status: DC | PRN

## 2014-04-09 MED ORDER — PANTOPRAZOLE SODIUM 40 MG PO TBEC
40.0000 mg | DELAYED_RELEASE_TABLET | Freq: Two times a day (BID) | ORAL | Status: DC
  Administered 2014-04-09 – 2014-04-10 (×3): 40 mg via ORAL
  Filled 2014-04-09 (×3): qty 1

## 2014-04-09 MED ORDER — ONDANSETRON HCL 4 MG/2ML IJ SOLN: 4.0000 mg | Freq: Four times a day (QID) | INTRAMUSCULAR | Status: DC | PRN

## 2014-04-09 MED ORDER — CARVEDILOL 3.125 MG PO TABS
6.2500 mg | ORAL_TABLET | Freq: Two times a day (BID) | ORAL | Status: DC
  Administered 2014-04-09 – 2014-04-10 (×3): 6.25 mg via ORAL
  Filled 2014-04-09 (×3): qty 2

## 2014-04-09 MED ORDER — FUROSEMIDE 10 MG/ML IJ SOLN
40.0000 mg | Freq: Two times a day (BID) | INTRAMUSCULAR | Status: DC
  Administered 2014-04-09: 40 mg via INTRAVENOUS
  Filled 2014-04-09 (×2): qty 4

## 2014-04-09 MED ORDER — LISINOPRIL 5 MG PO TABS
5.0000 mg | ORAL_TABLET | Freq: Every day | ORAL | Status: DC
  Administered 2014-04-09 – 2014-04-10 (×2): 5 mg via ORAL
  Filled 2014-04-09: qty 1

## 2014-04-09 MED ORDER — LORAZEPAM 0.5 MG PO TABS
0.5000 mg | ORAL_TABLET | Freq: Two times a day (BID) | ORAL | Status: DC
Start: 2014-04-09 — End: 2014-04-10
  Administered 2014-04-09 – 2014-04-10 (×3): 0.5 mg via ORAL
  Filled 2014-04-09 (×3): qty 1

## 2014-04-09 MED ORDER — APIXABAN 5 MG PO TABS
5.0000 mg | ORAL_TABLET | Freq: Two times a day (BID) | ORAL | Status: DC
  Administered 2014-04-09 – 2014-04-10 (×3): 5 mg via ORAL
  Filled 2014-04-09 (×3): qty 1

## 2014-04-09 MED ORDER — SODIUM CHLORIDE 0.9 % IJ SOLN
3.0000 mL | Freq: Two times a day (BID) | INTRAMUSCULAR | Status: DC
  Administered 2014-04-09 – 2014-04-10 (×3): 3 mL via INTRAVENOUS

## 2014-04-09 MED ORDER — ONDANSETRON HCL 4 MG PO TABS: 4.0000 mg | ORAL_TABLET | Freq: Three times a day (TID) | ORAL | Status: DC | PRN

## 2014-04-09 NOTE — H&P (Signed)
[facility] History and Physical  Loyce Flaming TWS:[id number] DOB: 1955/11/15    PCP:   [name], MD   Chief Complaint: increased DOE for the past three days.  HPI: [name] is an 59 y.o. female with hx of prior alcohol abuse, current tobacco abuse, hx of ischemic cardiomyopathy with LHC 4/15 showing occluded LAD and moderate dz in OM1 with small posterior vessel, hx of PAF and EF 20-25 percent on Eliquis, hx of prior suicidal overdose, anxiety, prior CVA, hypothyroidism, under care of Dr Marigene Ehlers of cardiology, presented to the ER with increase DOE for the past 3 days,  She denied CP, fever, chills or coughs.  Evaluation in the ER included a CXR showing vascular congestion and small bilateral effusions, BNP 1385, normal renal Fx test and normal K, with negative troponin.  Her EKG showed ST at 104, with poor R wave progression over the precordial leads, but no acute ST T changes.  She was given neb tx, and IV lasix, and hospitalist was asked to admit her for CHF.   Rewiew of Systems:  Constitutional: Negative for malaise, fever and chills. Eyes: Negative for eye pain, redness and discharge, diplopia, visual changes, or flashes of light. ENMT: Negative for ear pain, hoarseness, nasal congestion, sinus pressure and sore throat. No headaches; tinnitus, drooling, or problem swallowing. Cardiovascular: Negative for chest pain, palpitations, diaphoresis,   Respiratory: Negative for cough, hemoptysis, wheezing and stridor. No pleuritic chestpain. Gastrointestinal: Negative for nausea, vomiting, diarrhea, constipation, abdominal pain, melena, blood in stool, hematemesis, jaundice and rectal bleeding.    Genitourinary: Negative for frequency, dysuria, incontinence,flank pain and hematuria; Musculoskeletal: Negative for back pain and neck pain. Negative for swelling and trauma.;  Skin: . Negative for pruritus, rash, abrasions, bruising and skin lesion.; ulcerations Neuro: Negative for  headache, lightheadedness and neck stiffness. Negative for weakness, altered level of consciousness , altered mental status, extremity weakness, burning feet, involuntary movement, seizure and syncope.  Psych: negative for anxiety, depression, insomnia, tearfulness, panic attacks, hallucinations, paranoia, suicidal or homicidal ideation    Past Medical History  Diagnosis Date  . Diverticulosis   . Pancreatitis 1980, 2015    in the setting of ETOH  . chronic systolic heart failure     a. ECHO (05/08/13): EF 20-25%, diff HK with akinesis of basal inferior wall, grade 1 DD, trivial MR, trivial TR b) RHC (05/06/13): RA 7, RV 30/2, PA 31/19 (24), PCWP 10, Fick CO/CI: 2.9/1.74, Thermo CO/Ci: 2.4/1.4, PA sat 53%  . Ischemic cardiomyopathy   . CAD (coronary artery disease)     a. LHC (04/2013): Chronically occluded LAD, moderate dz in large OM1 and severe dz and small posterior lateral vessel    . A-fib 2015  . Suicidal overdose 10/03/13    Overdoes on Beta Blockers & Xarelto [facility] ED)  . Anxiety 2015   . Psoriasis 1968  . GERD (gastroesophageal reflux disease) 2015   . Hyperlipidemia 2015  . Hypertension 2015  . ETOH abuse 1981    started abusing alcohol at age 2   . Dysrhythmia   . Stroke 2011    left side weakness, minimal  . Hypothyroidism   . PONV (postoperative nausea and vomiting)     Past Surgical History  Procedure Laterality Date  . Colostomy takedown  2007  . Exploratory laparotomy with colon resection, colostomy  2007  . Esophagogastroduodenoscopy (egd) with propofol N/A 11/22/2013    Dr. Oneida Alar: gastritis and duodenitis, negative H.pylori  . Esophageal biopsy N/A 11/22/2013  Procedure: GASTRIC BIOPSIES;  Surgeon: Danie Binder, MD;  Location: [facility];  Service: Endoscopy;  Laterality: N/A;  . Left and right heart catheterization with coronary angiogram N/A 05/10/2013    Procedure: LEFT AND RIGHT HEART CATHETERIZATION WITH CORONARY ANGIOGRAM;  Surgeon: [name], MD;   Location: [facility] CATH LAB;  Service: Cardiovascular;  Laterality: N/A;  . Left heart catheterization with coronary angiogram N/A 09/03/2013    Procedure: LEFT HEART CATHETERIZATION WITH CORONARY ANGIOGRAM;  Surgeon: Sinclair Grooms, MD;  Location: [facility] CATH LAB;  Service: Cardiovascular;  Laterality: N/A;  . Colonoscopy with propofol N/A 03/29/2014    Procedure: COLONOSCOPY WITH PROPOFOL;  Surgeon: Danie Binder, MD;  Location: [facility];  Service: Endoscopy;  Laterality: N/A;  Cecum time in 1101  time out   1127  total time 26 minutes  . Polypectomy N/A 03/29/2014    Procedure: POLYPECTOMY;  Surgeon: Danie Binder, MD;  Location: [facility];  Service: Endoscopy;  Laterality: N/A;  ascending colon, sigmoid colon x4, rectal x3    Medications:  HOME MEDS: Prior to Admission medications   Medication Sig Start Date End Date Taking? Authorizing Provider  acetaminophen (TYLENOL) 325 MG tablet Take 650 mg by mouth every 6 (six) hours as needed for fever or headache.   Yes Historical Provider, MD  ammonium lactate (LAC-HYDRIN) 12 % cream Apply topically as needed for dry skin. Apply to elbows 10/22/13  Yes Josalyn Funches, MD  apixaban (ELIQUIS) 5 MG TABS tablet Take 1 tablet (5 mg total) by mouth 2 (two) times daily. 12/30/13  Yes Amy D Clegg, NP  atorvastatin (LIPITOR) 80 MG tablet Take 1 tablet (80 mg total) by mouth daily. 01/31/14  Yes Larey Dresser, MD  carvedilol (COREG) 6.25 MG tablet Take 1 tablet (6.25 mg total) by mouth 2 (two) times daily with a meal. Hold for heart rate less than 60 12/30/13  Yes Amy D Clegg, NP  furosemide (LASIX) 20 MG tablet Take 1 tablet (20 mg total) by mouth as needed. Take 20 mg for weight 128 pounds or greater. 10/20/13  Yes Amy D Clegg, NP  levothyroxine (SYNTHROID, LEVOTHROID) 25 MCG tablet Take 0.125 mcg by mouth daily before breakfast.   Yes Historical Provider, MD  lisinopril (PRINIVIL,ZESTRIL) 5 MG tablet Take 1 tablet (5 mg total) by mouth daily. 01/27/14  Yes Larey Dresser, MD  LORazepam (ATIVAN) 0.5 MG tablet Take 1 tablet (0.5 mg total) by mouth 2 (two) times daily. 12/30/13  Yes Josalyn Funches, MD  nitroGLYCERIN (NITROSTAT) 0.3 MG SL tablet Place 0.3 mg under the tongue every 5 (five) minutes as needed for chest pain.   Yes Historical Provider, MD  ondansetron (ZOFRAN) 4 MG tablet Take 1 tablet (4 mg total) by mouth every 8 (eight) hours as needed for nausea or vomiting. 11/18/13  Yes Orvil Feil, NP  pantoprazole (PROTONIX) 40 MG tablet TAKE 1 TABLET BY MOUTH TWICE DAILY BEFORE A MEAL. 02/25/14  Yes Carlis Stable, NP  phosphate laxative (FLEET) 2.7-7.2 GM/15ML solution Take 15 mLs by mouth once. 03/10/14  Yes Danie Binder, MD  sertraline (ZOLOFT) 50 MG tablet Take 1 tablet (50 mg total) by mouth daily. 12/30/13  Yes Josalyn Funches, MD  peg 3350 powder (MOVIPREP) 100 G SOLR Take 1 kit (200 g total) by mouth once. 03/10/14 04/09/14  Danie Binder, MD     Allergies:  Allergies  Allergen Reactions  . Morphine And Related Other (See Comments)  Dizziness and hallucinations  . Penicillins Anaphylaxis and Rash    Social History:   reports that she has been smoking Cigarettes.  She has a 11.75 pack-year smoking history. She has never used smokeless tobacco. She reports that she does not drink alcohol or use illicit drugs.  Family History: Family History  Problem Relation Age of Onset  . CAD Father 24    MI  . CAD Mother 44    MI  . Alcohol abuse Mother   . CAD Sister 50    Stent  . Diabetes Sister   . CAD Brother 74    MI  . Cancer Neg Hx   . Colon cancer Mother     in her 57s     Physical Exam: Filed Vitals:   04/09/14 0100 04/09/14 0130 04/09/14 0200 04/09/14 0230  BP: 137/75 147/84 153/101 152/100  Pulse: 94 96 104 103  Temp:      TempSrc:      Resp: 34 20 35 36  Height:      Weight:      SpO2: 91% 87% 87% 95%   Blood pressure 152/100, pulse 103, temperature 97.6 F (36.4 C), temperature source Oral, resp. rate 36, height 5'  3" (1.6 m), weight 53.524 kg (118 lb), last menstrual period 01/22/1988, SpO2 95 %.  GEN:  Pleasant  patient lying in the stretcher in no acute distress; cooperative with exam. PSYCH:  alert and oriented x4; does not appear anxious or depressed; affect is appropriate. HEENT: Mucous membranes pink and anicteric; PERRLA; EOM intact; no cervical lymphadenopathy nor thyromegaly or carotid bruit; no JVD; There were no stridor. Neck is very supple. Breasts:: Not examined CHEST WALL: No tenderness CHEST: Normal respiration, poor insp and exp BS, with rales at both bases. HEART: Regular rate and rhythm.  There are no murmur, rub, or gallops.   BACK: No kyphosis or scoliosis; no CVA tenderness ABDOMEN: soft and non-tender; no masses, no organomegaly, normal abdominal bowel sounds; no pannus; no intertriginous candida. There is no rebound and no distention. Rectal Exam: Not done EXTREMITIES: No bone or joint deformity; age-appropriate arthropathy of the hands and knees; trace edema; no ulcerations.  There is no calf tenderness. Genitalia: not examined PULSES: 2+ and symmetric SKIN: Normal hydration no rash or ulceration CNS: Cranial nerves 2-12 grossly intact no focal lateralizing neurologic deficit.  Speech is fluent; uvula elevated with phonation, facial symmetry and tongue midline. DTR are normal bilaterally, cerebella exam is intact, barbinski is negative and strengths are equaled bilaterally.  No sensory loss.   Labs on Admission:  Basic Metabolic Panel:  Recent Labs Lab 04/09/14 0038  NA 142  K 3.6  CL 111  CO2 23  GLUCOSE 120*  BUN 24*  CREATININE 1.02  CALCIUM 9.0   Liver Function Tests:  Recent Labs Lab 04/09/14 0038  AST 15  ALT 9  ALKPHOS 74  BILITOT 0.6  PROT 7.6  ALBUMIN 4.0   No results for input(s): LIPASE, AMYLASE in the last 168 hours. No results for input(s): AMMONIA in the last 168 hours. CBC:  Recent Labs Lab 04/09/14 0038  WBC 7.6  NEUTROABS 5.3  HGB  8.1*  HCT 26.0*  MCV 84.4  PLT 326   Cardiac Enzymes:  Recent Labs Lab 04/09/14 0038  TROPONINI <0.03    CBG: No results for input(s): GLUCAP in the last 168 hours.   Radiological Exams on Admission: Dg Chest 2 View  04/09/2014   CLINICAL DATA:  Shortness  of breath, nonproductive cough.  EXAM: CHEST  2 VIEW  COMPARISON:  11/18/2013  FINDINGS: Enlarged cardiac silhouette. Central vascular congestion. Interstitial prominence. Right greater than left pleural effusion. Bibasilar airspace opacities. No pneumothorax. Osteopenia.  IMPRESSION: Enlarged cardiac silhouette with central vascular congestion.  Interstitial prominence is favored to reflect interstitial pulmonary edema.  Small right and trace left pleural effusions with associated airspace opacities; atelectasis versus pneumonia.  Recommend radiograph follow-up in 2-3 weeks to document resolution.   Electronically Signed   By: Carlos Levering M.D.   On: 04/09/2014 02:35    EKG: Independently reviewed.    Assessment/Plan Present on Admission:  . CAD- total LAD- residual OM2 disease . Cardiomyopathy, ischemic- EF 25-30% 2D 09/02/13 . H/O alcohol abuse . PAF (paroxysmal atrial fibrillation) . Tobacco abuse . Anxiety state . Anemia . Hypothyroidism . Hypertension  PLAN:  Will admit her for acute on chronic combined CHF, with ischemic CMP as well.  WIll give her IV Lasix, consider IV Bumex,  and follow her Cr carefully along with K.  I think she has a component of COPD as well, and she was urged to quit cigarettes immediately and indefinitely.  Please consult cardiology as to whether or not she would need a Life Vest or a defibrillator placement.  For her hypothyroidism, will obtain a TSH.  For her anxiety, will continue with her benzo.  She is stable, full code, and will be admitted to [facility] service.  Thank you for allowing me to participate in her care.   Other plans as per orders.  Code Status: FULL Haskel Khan,  MD. [facility] Pager [phone] 7pm to 7am.  04/09/2014, 3:39 AM

## 2014-04-09 NOTE — Progress Notes (Signed)
Echocardiogram 2D Echocardiogram has been performed.  [name] 04/09/2014, 9:45 AM

## 2014-04-09 NOTE — Progress Notes (Signed)
PROGRESS NOTE  [name] CVE:[id number] DOB: 1955-02-25 DOA: 04/09/2014 PCP: [name], MD  Summary: 59 year old woman with history of ischemic cardiomyopathy, chronic systolic heart failure, coronary artery disease, atrial fibrillation on Eliquis presented to the emergency department with increasing shortness of breath, dyspnea on exertion, cough, orthopnea. Admitted for acute systolic heart failure.  Assessment/Plan: 1. Acute hypoxic respiratory failure, resolved, secondary to CHF 2. Acute on chronic systolic congestive heart failure, ischemic cardiomyopathy, LVEF 20-25 percent. Inciting event unclear, perhaps related to colonoscopy prep; reports watches salt, compliant with medications. No chest pain or features to suggest ACS. 3. Diabetes mellitus. Stable.  4. Atrial fibrillation on Eliquis. Currently in SR. 5. Cigarette smoker  6. History of alcohol use.   Overall improving, hypoxia has resolved.  Plan to change to oral diuretics in AM and possibly home.  Code Status: full code DVT prophylaxis: Eliquis Family Communication: none Disposition Plan: home  Murray Hodgkins, MD  [facility]  Pager [phone] If 7PM-7AM, please contact night-coverage at [email or web address], password [facility] 04/09/2014, 4:51 PM  LOS: 0 days   Consultants:    Procedures:    Antibiotics:    HPI/Subjective: Feels better, breathing better, eating fine. No chest pain. No LE edema.  Objective: Filed Vitals:   04/09/14 0407 04/09/14 0520 04/09/14 0930 04/09/14 1636  BP: 103/82 148/77 121/71 100/58  Pulse: 94 86 82 71  Temp: 97.6 F (36.4 C) 97.6 F (36.4 C)  99 F (37.2 C)  TempSrc: Oral Oral  Tympanic  Resp: 24 27  16   Height:      Weight:  57.561 kg (126 lb 14.4 oz)    SpO2: 96% 94% 94% 94%    Intake/Output Summary (Last 24 hours) at 04/09/14 1651 Last data filed at 04/09/14 1600  Gross per 24 hour  Intake      0 ml  Output    700 ml  Net   -700 ml     Filed  Weights   04/09/14 0019 04/09/14 0520  Weight: 53.524 kg (118 lb) 57.561 kg (126 lb 14.4 oz)    Exam:     Afebrile, vital signs stable, no hypoxia General:  Appears calm and comfortable Cardiovascular: RRR, no m/r/g. No LE edema. Telemetry: SR, no arrhythmias  Respiratory: CTA bilaterally, no w/r/r. Normal respiratory effort. Psychiatric: grossly normal mood and affect, speech fluent and appropriate  Data Reviewed:  Urine output 700, intake not recorded  Basic metabolic panel unremarkable   BNP 1385  Troponin negative    hemoglobin 8.1, stable  TSH modestly elevated at 4.9  Chest x-ray suggested interstitial pulmonary edema, atelectasis  EKG sinus rhythm, left bundle branch block  Scheduled Meds: . antiseptic oral rinse  7 mL Mouth Rinse BID  . apixaban  5 mg Oral BID  . atorvastatin  80 mg Oral q1800  . carvedilol  6.25 mg Oral BID WC  . furosemide  40 mg Intravenous BID  . levothyroxine  12.5 mcg Oral QAC breakfast  . lisinopril  5 mg Oral Daily  . lisinopril  5 mg Oral Daily  . LORazepam  0.5 mg Oral BID  . pantoprazole  40 mg Oral BID  . potassium chloride  20 mEq Oral Daily  . sertraline  50 mg Oral Daily  . sodium chloride  3 mL Intravenous Q12H   Continuous Infusions:   Principal Problem:   Acute on chronic systolic CHF (congestive heart failure) Active Problems:   Hypertension   H/O alcohol abuse   Tobacco abuse  PAF (paroxysmal atrial fibrillation)   Cardiomyopathy, ischemic- EF 25-30% 2D 09/02/13   CAD- total LAD- residual OM2 disease   Anxiety state   Hypothyroidism   Anemia   CHF (congestive heart failure)   Time spent 20 minutes

## 2014-04-09 NOTE — ED Notes (Signed)
Pt arrived by EMS complaining of SOB for the past week that has gotten worse tonight & a cough.

## 2014-04-09 NOTE — ED Provider Notes (Signed)
CSN: [id number]     Arrival date & time 04/09/14  0012 History  This chart was scribed for Veryl Speak, MD by Tula Nakayama, ED Scribe. This patient was seen in room APA09/APA09 and the patient's care was started at 12:19 AM.    Chief Complaint  Patient presents with  . Shortness of Breath   Patient is a 59 y.o. female presenting with shortness of breath. The history is provided by the patient. No language interpreter was used.  Shortness of Breath Severity:  Moderate Onset quality:  Gradual Duration:  1 week Timing:  Constant Progression:  Worsening Chronicity:  New Context: activity   Relieved by:  None tried Worsened by:  Movement and activity Ineffective treatments:  Lying down Associated symptoms: chest pain and cough    HPI Comments: [name] is a 59 y.o. female BIBA with a history of CHF, DM, CAD and HTN who presents to the Emergency Department complaining of constant, gradually worsening, moderate SOB that started 1 week ago. She states nonproductive cough and mild CP that occurs with cough as associated symptoms. She states SOB becomes worse with lying down and movement. Pt smokes 2-3 cigarettes daily. Pt reports that she takes Lasix prn, but does not have a home nebulizer or inhaler. She has not taken Lasix recently. Pt denies leg swelling.  Cardiologist Algernon Huxley Past Medical History  Diagnosis Date  . Diverticulosis   . Pancreatitis 1980, 2015    in the setting of ETOH  . chronic systolic heart failure     a. ECHO (05/08/13): EF 20-25%, diff HK with akinesis of basal inferior wall, grade 1 DD, trivial MR, trivial TR b) RHC (05/06/13): RA 7, RV 30/2, PA 31/19 (24), PCWP 10, Fick CO/CI: 2.9/1.74, Thermo CO/Ci: 2.4/1.4, PA sat 53%  . Ischemic cardiomyopathy   . CAD (coronary artery disease)     a. LHC (04/2013): Chronically occluded LAD, moderate dz in large OM1 and severe dz and small posterior lateral vessel    . A-fib 2015  . Suicidal overdose 10/03/13    Overdoes on  Beta Blockers & Xarelto [facility] ED)  . Anxiety 2015   . Psoriasis 1968  . GERD (gastroesophageal reflux disease) 2015   . Hyperlipidemia 2015  . Hypertension 2015  . ETOH abuse 1981    started abusing alcohol at age 22   . Dysrhythmia   . Stroke 2011    left side weakness, minimal  . Hypothyroidism   . PONV (postoperative nausea and vomiting)    Past Surgical History  Procedure Laterality Date  . Colostomy takedown  2007  . Exploratory laparotomy with colon resection, colostomy  2007  . Esophagogastroduodenoscopy (egd) with propofol N/A 11/22/2013    Dr. Oneida Alar: gastritis and duodenitis, negative H.pylori  . Esophageal biopsy N/A 11/22/2013    Procedure: GASTRIC BIOPSIES;  Surgeon: Danie Binder, MD;  Location: [facility] ORS;  Service: Endoscopy;  Laterality: N/A;  . Left and right heart catheterization with coronary angiogram N/A 05/10/2013    Procedure: LEFT AND RIGHT HEART CATHETERIZATION WITH CORONARY ANGIOGRAM;  Surgeon: [name], MD;  Location: [facility] CATH LAB;  Service: Cardiovascular;  Laterality: N/A;  . Left heart catheterization with coronary angiogram N/A 09/03/2013    Procedure: LEFT HEART CATHETERIZATION WITH CORONARY ANGIOGRAM;  Surgeon: Sinclair Grooms, MD;  Location: [facility] CATH LAB;  Service: Cardiovascular;  Laterality: N/A;  . Colonoscopy with propofol N/A 03/29/2014    Procedure: COLONOSCOPY WITH PROPOFOL;  Surgeon: Danie Binder, MD;  Location: [facility] ORS;  Service: Endoscopy;  Laterality: N/A;  Cecum time in 1101  time out   1127  total time 26 minutes  . Polypectomy N/A 03/29/2014    Procedure: POLYPECTOMY;  Surgeon: Danie Binder, MD;  Location: [facility] ORS;  Service: Endoscopy;  Laterality: N/A;  ascending colon, sigmoid colon x4, rectal x3   Family History  Problem Relation Age of Onset  . CAD Father 19    MI  . CAD Mother 62    MI  . Alcohol abuse Mother   . CAD Sister 52    Stent  . Diabetes Sister   . CAD Brother 10    MI  . Cancer Neg Hx   . Colon cancer Mother      in her 4s   History  Substance Use Topics  . Smoking status: Current Some Day Smoker -- 0.25 packs/day for 47 years    Types: Cigarettes  . Smokeless tobacco: Never Used     Comment: she smokes occasionally, trying to quit  . Alcohol Use: No     Comment: last drink in 03/2013    OB History    No data available     Review of Systems  Respiratory: Positive for cough and shortness of breath.   Cardiovascular: Positive for chest pain. Negative for leg swelling.  All other systems reviewed and are negative.     Allergies  Morphine and related and Penicillins  Home Medications   Prior to Admission medications   Medication Sig Start Date End Date Taking? Authorizing Provider  acetaminophen (TYLENOL) 325 MG tablet Take 650 mg by mouth every 6 (six) hours as needed for fever or headache.    Historical Provider, MD  ammonium lactate (LAC-HYDRIN) 12 % cream Apply topically as needed for dry skin. Apply to elbows 10/22/13   Boykin Nearing, MD  apixaban (ELIQUIS) 5 MG TABS tablet Take 1 tablet (5 mg total) by mouth 2 (two) times daily. 12/30/13   Amy D Ninfa Meeker, NP  atorvastatin (LIPITOR) 80 MG tablet Take 1 tablet (80 mg total) by mouth daily. 01/31/14   Larey Dresser, MD  carvedilol (COREG) 6.25 MG tablet Take 1 tablet (6.25 mg total) by mouth 2 (two) times daily with a meal. Hold for heart rate less than 60 12/30/13   Amy D Clegg, NP  furosemide (LASIX) 20 MG tablet Take 1 tablet (20 mg total) by mouth as needed. Take 20 mg for weight 128 pounds or greater. 10/20/13   Amy D Clegg, NP  levothyroxine (SYNTHROID, LEVOTHROID) 25 MCG tablet Take 0.125 mcg by mouth daily before breakfast.    Historical Provider, MD  lisinopril (PRINIVIL,ZESTRIL) 5 MG tablet Take 1 tablet (5 mg total) by mouth daily. 01/27/14   Larey Dresser, MD  LORazepam (ATIVAN) 0.5 MG tablet Take 1 tablet (0.5 mg total) by mouth 2 (two) times daily. 12/30/13   Josalyn Funches, MD  nitroGLYCERIN (NITROSTAT) 0.3 MG SL tablet  Place 0.3 mg under the tongue every 5 (five) minutes as needed for chest pain.    Historical Provider, MD  ondansetron (ZOFRAN) 4 MG tablet Take 1 tablet (4 mg total) by mouth every 8 (eight) hours as needed for nausea or vomiting. 11/18/13   Orvil Feil, NP  pantoprazole (PROTONIX) 40 MG tablet TAKE 1 TABLET BY MOUTH TWICE DAILY BEFORE A MEAL. 02/25/14   Carlis Stable, NP  peg 3350 powder (MOVIPREP) 100 G SOLR Take 1 kit (200 g total) by mouth  once. 03/10/14 04/09/14  Danie Binder, MD  phosphate laxative (FLEET) 2.7-7.2 GM/15ML solution Take 15 mLs by mouth once. 03/10/14   Danie Binder, MD  sertraline (ZOLOFT) 50 MG tablet Take 1 tablet (50 mg total) by mouth daily. 12/30/13   Josalyn Funches, MD   BP 139/81 mmHg  Pulse 94  Temp(Src) 97.6 F (36.4 C) (Oral)  Resp 22  Ht _0  (1.6 m)  Wt 118 lb (53.524 kg)  BMI 20.91 kg/m2  SpO2 90%  LMP 01/22/1988 (Approximate) Physical Exam  Constitutional: She is oriented to person, place, and time. She appears well-developed and well-nourished. No distress.  HENT:  Head: Normocephalic and atraumatic.  Eyes: EOM are normal.  Neck: Normal range of motion.  Cardiovascular: Normal rate, regular rhythm and normal heart sounds.   Pulmonary/Chest: Effort normal. She has rales (In bases bilaterally).  Abdominal: Soft. She exhibits no distension. There is no tenderness.  Musculoskeletal: Normal range of motion.  Neurological: She is alert and oriented to person, place, and time.  Skin: Skin is warm and dry.  Psychiatric: She has a normal mood and affect. Judgment normal.  Nursing note and vitals reviewed.   ED Course  Procedures   DIAGNOSTIC STUDIES: Oxygen Saturation is 87% on RA, low by my interpretation.    COORDINATION OF CARE: 12:26 AM Discussed treatment plan with pt at bedside and pt agreed to plan.   Labs Review Labs Reviewed - No data to display  Imaging Review No results found.   EKG Interpretation   Date/Time:  Saturday April 09 2014 00:29:39 EDT Ventricular Rate:  101 PR Interval:  158 QRS Duration: 155 QT Interval:  400 QTC Calculation: 518 R Axis:   106 Text Interpretation:  Sinus tachycardia Nonspecific intraventricular  conduction delay Anterior infarct, acute (LAD) No significant change since  01/27/2014 Confirmed by DELOS  MD, Elana Jian ([id number]) on 04/09/2014 12:43:17 AM      MDM   Final diagnoses:  None    Workup is consistent with CHF with hypoxia. Patient was given IV Lasix and nebulizers, however remains hypoxic with oxygen requirement. Her BNP is elevated and chest x-ray is reflective of CHF. She will be admitted to the hospitalist service under the care of Dr. Marin Comment.  I personally performed the services described in this documentation, which was scribed in my presence. The recorded information has been reviewed and is accurate.      Veryl Speak, MD 04/09/14 [phone]

## 2014-04-09 NOTE — ED Notes (Signed)
Patient states SOB X1 week, got worse tonight. Patient states dry non productive cough. Patient has a history of AFIB and CHF. Patient denies any other respiratory history. A&OX4 at this time. Patient states pain to upper abdomin that is worse when she coughs.

## 2014-04-09 NOTE — Progress Notes (Signed)
Pts BP 90/51 with a HR of 67. MD made aware and will continue to monitor.

## 2014-04-10 DIAGNOSIS — D649 Anemia, unspecified: Secondary | ICD-10-CM

## 2014-04-10 LAB — BASIC METABOLIC PANEL
ANION GAP: 9 (ref 5–15)
BUN: 30 mg/dL — AB (ref 6–23)
CALCIUM: 8.8 mg/dL (ref 8.4–10.5)
CHLORIDE: 107 mmol/L (ref 96–112)
CO2: 25 mmol/L (ref 19–32)
CREATININE: 1.2 mg/dL — AB (ref 0.50–1.10)
GFR calc Af Amer: 56 mL/min — ABNORMAL LOW (ref >90)
GFR calc non Af Amer: 48 mL/min — ABNORMAL LOW (ref >90)
GLUCOSE: 94 mg/dL (ref 70–99)
POTASSIUM: 3.1 mmol/L — AB (ref 3.5–5.1)
Sodium: 141 mmol/L (ref 135–145)

## 2014-04-10 MED ORDER — POTASSIUM CHLORIDE CRYS ER 20 MEQ PO TBCR
40.0000 meq | EXTENDED_RELEASE_TABLET | ORAL | Status: AC
  Administered 2014-04-10 (×2): 40 meq via ORAL
  Filled 2014-04-10 (×2): qty 2

## 2014-04-10 NOTE — Progress Notes (Signed)
PROGRESS NOTE  [name] QMV:[id number] DOB: Jun 26, 1955 DOA: 04/09/2014 PCP: [name], MD  Summary: 59 year old woman with history of ischemic cardiomyopathy, chronic systolic heart failure, coronary artery disease, atrial fibrillation on Eliquis presented to the emergency department with increasing shortness of breath, dyspnea on exertion, cough, orthopnea. Admitted for acute systolic heart failure.  Assessment/Plan: 1. Acute hypoxic respiratory failure, resolved, secondary to CHF. 2. Acute on chronic systolic congestive heart failure, ischemic cardiomyopathy, LVEF 20-25 percent. Acute component resolved. Inciting event unclear, perhaps related to colonoscopy prep; reports watches salt, compliant with medications. No chest pain or features to suggest ACS. 2-d echocardiogram without significant change. 3. Diabetes mellitus. Remains satble.  4. Atrial fibrillation on Eliquis. Remains in sinus rhythm. 5. Cigarette smoker. Recommended cessation. 6. History of alcohol use. 7. Hypothyroidism. TSH modestly elevated 4.9. On levothyroxine. F/u as outpatient. No adjustment made.   Much improved, acute CHF resolved. Patient aware of low salt diet and fluid restriction.  Plan home today. Has appointment with Dr. Aundra Dubin already scheduled for 3/21. Has not been on scheduled Lasix. Will make no changes to medication regimen at this point.  Murray Hodgkins, MD  [facility]  Pager [phone] If 7PM-7AM, please contact night-coverage at [email or web address], password [email or web address] 04/10/2014, 9:40 AM  LOS: 1 day   Consultants:  None (no cardiology on weekends)  Procedures:  2-d echocardiogram Impressions:  - When compared to the study from 09/02/2013 there is no significant difference, the LVEF remains severely impaired with grade 2 diastolic dysfunction and elevated filling pressures. Normal RV function, normal RVSP.  Antibiotics:    HPI/Subjective: Feeling fine. No chest pain, no  shortness of breath. Eating fine.   Objective: Filed Vitals:   04/09/14 2233 04/10/14 0623 04/10/14 0750 04/10/14 0930  BP: 90/51 104/64  104/67  Pulse: 67 78  72  Temp: 98.6 F (37 C) 98.3 F (36.8 C)    TempSrc: Oral Oral    Resp: 20 20    Height:      Weight:  56.881 kg (125 lb 6.4 oz)    SpO2: 92% 93% 93%     Intake/Output Summary (Last 24 hours) at 04/10/14 0940 Last data filed at 04/10/14 0900  Gross per 24 hour  Intake    550 ml  Output    700 ml  Net   -150 ml     Filed Weights   04/09/14 0019 04/09/14 0520 04/10/14 0623  Weight: 53.524 kg (118 lb) 57.561 kg (126 lb 14.4 oz) 56.881 kg (125 lb 6.4 oz)    Exam:     Afebrile, VSS, no hypoxia General: Appears comfortable, calm. Cardiovascular: Regular rate and rhythm, no murmur, rub or gallop. No lower extremity edema. Telemetry: Sinus rhythm, no arrhythmias  Respiratory: Clear to auscultation bilaterally, no wheezes, rales or rhonchi. Normal respiratory effort. Psychiatric: grossly normal mood and affect, speech fluent and appropriate  Data Reviewed:  Weight stable 56.8 kg  I/O not fully recorded  K+ 3.1, BUN 30, creatinine 1.2  BNP 1385 on admission  Troponin negative on admission   Hemoglobin 8.1, stable on admission  TSH modestly elevated at 4.9  Chest x-ray suggested interstitial pulmonary edema, atelectasis on admission  EKG sinus rhythm, left bundle branch block (NSCSLT 01/27/14)  Scheduled Meds: . antiseptic oral rinse  7 mL Mouth Rinse BID  . apixaban  5 mg Oral BID  . atorvastatin  80 mg Oral q1800  . carvedilol  6.25 mg Oral BID WC  . levothyroxine  12.5 mcg  Oral QAC breakfast  . lisinopril  5 mg Oral Daily  . LORazepam  0.5 mg Oral BID  . pantoprazole  40 mg Oral BID  . potassium chloride  40 mEq Oral Q4H  . sertraline  50 mg Oral Daily  . sodium chloride  3 mL Intravenous Q12H   Continuous Infusions:   Principal Problem:   Acute on chronic systolic CHF (congestive heart  failure) Active Problems:   Hypertension   H/O alcohol abuse   Tobacco abuse   PAF (paroxysmal atrial fibrillation)   Cardiomyopathy, ischemic- EF 25-30% 2D 09/02/13   CAD- total LAD- residual OM2 disease   Anxiety state   Hypothyroidism   Anemia   CHF (congestive heart failure)   CHF exacerbation

## 2014-04-10 NOTE — Discharge Summary (Signed)
Physician Discharge Summary  [name] NTI:[id number] DOB: Mar 02, 1955 DOA: 04/09/2014  PCP: [name] Ends, MD  Admit date: 04/09/2014 Discharge date: 04/10/2014  Recommendations for Outpatient Follow-up:  1. Acute on chronic systolic congestive heart failure. Has cardiology follow-up arranged already 3/21. 2. Chronic normocytic anemia of unclear etiology. She has had a GI evaluation as an outpatient. Defer further evaluation to the outpatient setting. Anemia appears stable.  3. TSH was modestly elevated 4.9. Already on levothyroxine. No adjustment made at this time. Follow-up as an outpatient.   Follow-up Information    Follow up with [name] Champagne, MD On 04/11/2014.   Specialty:  Cardiology   Why:  9:00 AM   Contact information:   [address] [place] [address] [phone]      Discharge Diagnoses:  1. Acute hypoxic respiratory failure secondary to CHF 2. Acute on chronic systolic congestive heart failure, ischemic cardiomyopathy 3. Atrial fibrillation 4. Cigarette smoker 5. Chronic normocytic anemia  Discharge Condition: Improved Disposition: Home  Diet recommendation: Heart healthy, low-salt, limited fluid intake  Filed Weights   04/09/14 0019 04/09/14 0520 04/10/14 0623  Weight: 53.524 kg (118 lb) 57.561 kg (126 lb 14.4 oz) 56.881 kg (125 lb 6.4 oz)    History of present illness:  59 year old woman with history of ischemic cardiomyopathy, chronic systolic heart failure, coronary artery disease, atrial fibrillation on Eliquis presented to the emergency department with increasing shortness of breath, dyspnea on exertion, cough, orthopnea. Admitted for acute systolic heart failure.  Hospital Course:  [name] was treated with IV Lasix with adequate diuresis and rapid resolution of acute hypoxic respiratory failure. 2-D echocardiogram was without significant change. The patient's hospitalization was uncomplicated. Individual issues as  below.   Acute hypoxic respiratory failure, resolved, secondary to CHF.  Acute on chronic systolic congestive heart failure, ischemic cardiomyopathy, LVEF 20-25 percent. Acute component resolved. Inciting event unclear, perhaps related to colonoscopy prep; reports watches salt, compliant with medications. No chest pain or features to suggest ACS. 2-d echocardiogram without significant change.  Atrial fibrillation on Eliquis. Remains in sinus rhythm.  Chronic normocytic anemia. Stable. Has had GI evaluation as an outpatient. F/u as an outpatient recommended.  Cigarette smoker. Recommended cessation.  History of alcohol use.  Hypothyroidism. TSH modestly elevated 4.9. On levothyroxine. F/u as outpatient. No adjustment made.  Consultants:  None (no cardiology on weekends)  Procedures:  2-d echocardiogram Impressions:  - When compared to the study from 09/02/2013 there is no significant difference, the LVEF remains severely impaired with grade 2 diastolic dysfunction and elevated filling pressures. Normal RV function, normal RVSP.  Discharge Instructions  Discharge Instructions    (HEART FAILURE PATIENTS) Call MD:  Anytime you have any of the following symptoms: 1) 3 pound weight gain in 24 hours or 5 pounds in 1 week 2) shortness of breath, with or without a dry hacking cough 3) swelling in the hands, feet or stomach 4) if you have to sleep on extra pillows at night in order to breathe.    Complete by:  As directed      Activity as tolerated - No restrictions    Complete by:  As directed      Diet - low sodium heart healthy    Complete by:  As directed      Discharge instructions    Complete by:  As directed   Call your physician or seek immediate medical attention for shortness of breath, pain, swelling, weight gain or worsening of  condition.          Current Discharge Medication List    CONTINUE these medications which have NOT CHANGED   Details  acetaminophen  (TYLENOL) 325 MG tablet Take 650 mg by mouth every 6 (six) hours as needed for fever or headache.    apixaban (ELIQUIS) 5 MG TABS tablet Take 1 tablet (5 mg total) by mouth 2 (two) times daily. Qty: 60 tablet, Refills: 3    atorvastatin (LIPITOR) 80 MG tablet Take 1 tablet (80 mg total) by mouth daily. Qty: 30 tablet, Refills: 3    carvedilol (COREG) 6.25 MG tablet Take 1 tablet (6.25 mg total) by mouth 2 (two) times daily with a meal. Hold for heart rate less than 60 Qty: 60 tablet, Refills: 3    furosemide (LASIX) 20 MG tablet Take 1 tablet (20 mg total) by mouth as needed. Take 20 mg for weight 128 pounds or greater. Qty: 30 tablet, Refills: 6    levothyroxine (SYNTHROID, LEVOTHROID) 25 MCG tablet Take 0.125 mcg by mouth daily before breakfast.    lisinopril (PRINIVIL,ZESTRIL) 5 MG tablet Take 1 tablet (5 mg total) by mouth daily. Qty: 90 tablet, Refills: 3    LORazepam (ATIVAN) 0.5 MG tablet Take 1 tablet (0.5 mg total) by mouth 2 (two) times daily. Qty: 60 tablet, Refills: 2   Associated Diagnoses: Anxiety state    nitroGLYCERIN (NITROSTAT) 0.3 MG SL tablet Place 0.3 mg under the tongue every 5 (five) minutes as needed for chest pain.    pantoprazole (PROTONIX) 40 MG tablet TAKE 1 TABLET BY MOUTH TWICE DAILY BEFORE A MEAL. Qty: 60 tablet, Refills: 5    sertraline (ZOLOFT) 50 MG tablet Take 1 tablet (50 mg total) by mouth daily. Qty: 90 tablet, Refills: 1   Associated Diagnoses: Anxiety state      STOP taking these medications     ondansetron (ZOFRAN) 4 MG tablet        Allergies  Allergen Reactions  . Morphine And Related Other (See Comments)    Dizziness and hallucinations  . Penicillins Anaphylaxis and Rash    The results of significant diagnostics from this hospitalization (including imaging, microbiology, ancillary and laboratory) are listed below for reference.    Significant Diagnostic Studies: Dg Chest 2 View  04/09/2014   CLINICAL DATA:  Shortness of  breath, nonproductive cough.  EXAM: CHEST  2 VIEW  COMPARISON:  11/18/2013  FINDINGS: Enlarged cardiac silhouette. Central vascular congestion. Interstitial prominence. Right greater than left pleural effusion. Bibasilar airspace opacities. No pneumothorax. Osteopenia.  IMPRESSION: Enlarged cardiac silhouette with central vascular congestion.  Interstitial prominence is favored to reflect interstitial pulmonary edema.  Small right and trace left pleural effusions with associated airspace opacities; atelectasis versus pneumonia.  Recommend radiograph follow-up in 2-3 weeks to document resolution.   Electronically Signed   By: Carlos Levering M.D.   On: 04/09/2014 02:35   Labs: Basic Metabolic Panel:  Recent Labs Lab 04/09/14 0038 04/10/14 0617  NA 142 141  K 3.6 3.1*  CL 111 107  CO2 23 25  GLUCOSE 120* 94  BUN 24* 30*  CREATININE 1.02 1.20*  CALCIUM 9.0 8.8   Liver Function Tests:  Recent Labs Lab 04/09/14 0038  AST 15  ALT 9  ALKPHOS 74  BILITOT 0.6  PROT 7.6  ALBUMIN 4.0   CBC:  Recent Labs Lab 04/09/14 0038  WBC 7.6  NEUTROABS 5.3  HGB 8.1*  HCT 26.0*  MCV 84.4  PLT 326   Cardiac  Enzymes:  Recent Labs Lab 04/09/14 0038  TROPONINI <0.03     Recent Labs  04/09/14 0038  BNP 1385.0*    Principal Problem:   Acute on chronic systolic CHF (congestive heart failure) Active Problems:   Hypertension   H/O alcohol abuse   Tobacco abuse   PAF (paroxysmal atrial fibrillation)   Cardiomyopathy, ischemic- EF 25-30% 2D 09/02/13   CAD- total LAD- residual OM2 disease   Anxiety state   Hypothyroidism   Anemia   CHF (congestive heart failure)   CHF exacerbation   Time coordinating discharge: 25 minutes  Signed:  Murray Hodgkins, MD [facility] 04/10/2014, 9:56 AM

## 2014-04-10 NOTE — Progress Notes (Signed)
Patient received discharge instructions and had no further questions or concerns.  Patient in stable condition at discharge.  Patient escorted to main entrance via wheelchair by nurse tech.

## 2014-04-11 ENCOUNTER — Ambulatory Visit (HOSPITAL_COMMUNITY)
Admission: RE | Admit: 2014-04-11 | Discharge: 2014-04-11 | Disposition: A | Discharge status: Home / Self Care | Payer: Medicaid Other | Source: Ambulatory Visit | Attending: Internal Medicine | Admitting: Internal Medicine

## 2014-04-11 ENCOUNTER — Telehealth (HOSPITAL_COMMUNITY): Payer: Self-pay | Admitting: Cardiology

## 2014-04-11 ENCOUNTER — Encounter (HOSPITAL_COMMUNITY): Payer: Self-pay | Admitting: Cardiology

## 2014-04-11 VITALS — BP 92/62 | HR 73 | Wt 124.8 lb

## 2014-04-11 DIAGNOSIS — I5023 Acute on chronic systolic (congestive) heart failure: Secondary | ICD-10-CM

## 2014-04-11 DIAGNOSIS — F1010 Alcohol abuse, uncomplicated: Secondary | ICD-10-CM

## 2014-04-11 DIAGNOSIS — I255 Ischemic cardiomyopathy: Secondary | ICD-10-CM

## 2014-04-11 DIAGNOSIS — I5020 Unspecified systolic (congestive) heart failure: Secondary | ICD-10-CM | POA: Diagnosis not present

## 2014-04-11 DIAGNOSIS — D6489 Other specified anemias: Secondary | ICD-10-CM | POA: Diagnosis not present

## 2014-04-11 DIAGNOSIS — D649 Anemia, unspecified: Secondary | ICD-10-CM

## 2014-04-11 DIAGNOSIS — I5022 Chronic systolic (congestive) heart failure: Secondary | ICD-10-CM

## 2014-04-11 DIAGNOSIS — F1011 Alcohol abuse, in remission: Secondary | ICD-10-CM

## 2014-04-11 DIAGNOSIS — Z720 Tobacco use: Secondary | ICD-10-CM

## 2014-04-11 DIAGNOSIS — T447X2D Poisoning by beta-adrenoreceptor antagonists, intentional self-harm, subsequent encounter: Secondary | ICD-10-CM

## 2014-04-11 DIAGNOSIS — I480 Paroxysmal atrial fibrillation: Secondary | ICD-10-CM

## 2014-04-11 LAB — BASIC METABOLIC PANEL
Anion gap: 6 (ref 5–15)
BUN: 22 mg/dL (ref 6–23)
CHLORIDE: 110 mmol/L (ref 96–112)
CO2: 26 mmol/L (ref 19–32)
Calcium: 9.4 mg/dL (ref 8.4–10.5)
Creatinine, Ser: 1.14 mg/dL — ABNORMAL HIGH (ref 0.50–1.10)
GFR calc Af Amer: 60 mL/min — ABNORMAL LOW (ref >90)
GFR, EST NON AFRICAN AMERICAN: 52 mL/min — AB (ref >90)
GLUCOSE: 75 mg/dL (ref 70–99)
POTASSIUM: 4.6 mmol/L (ref 3.5–5.1)
SODIUM: 142 mmol/L (ref 135–145)

## 2014-04-11 LAB — CBC
HEMATOCRIT: 25.3 % — AB (ref 36.0–46.0)
Hemoglobin: 7.7 g/dL — ABNORMAL LOW (ref 12.0–15.0)
MCH: 25.4 pg — AB (ref 26.0–34.0)
MCHC: 30.4 g/dL (ref 30.0–36.0)
MCV: 83.5 fL (ref 78.0–100.0)
Platelets: 320 10*3/uL (ref 150–400)
RBC: 3.03 MIL/uL — AB (ref 3.87–5.11)
RDW: 14.5 % (ref 11.5–15.5)
WBC: 4.5 10*3/uL (ref 4.0–10.5)

## 2014-04-11 NOTE — Telephone Encounter (Signed)
Pt scheduled for R/L HC on 04/14/14 with Dr.McLean With patients current insurance- Medicaid  no pre cert required

## 2014-04-11 NOTE — Progress Notes (Signed)
Patient ID: [name], female   DOB: 24-Sep-1955, 59 y.o.   MRN: [id number]  PCP: N/A Primary Cardiologist: Dr. Percival Spanish  HPI: [name] is a 59 y/o female with a history of ETOH abuse, ICM, HTN, stroke and chronic systolic heart failure.  Admitted to [facility] 05/06/13 for pancreatitis. She had severely reduced systolic function with an EF of 20-25%. 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 Discharge weight was 140 pounds.   Admitted to [facility] 05/2013 with A fib RVR. Started on amiodarone 400 mg tiwce a day and Xarelto 20 mg daily. Chemically converted to NSR.  Admitted to [facility]  08/2013 wit chest pain. Troponin was elelvated. LHC was unchanged from April 2015 so she had CMRI that showed LAD territory was not viable. Plan to continue to treat medically.   Admitted 9/13 through 9/23 after attempted suicide. Overdosed on all HF medications. Discharged off carvedilol, lasix, lisinopril, xarelto and digoxin. Discharge weight 120 pounds. Discharged to [facility] which is a goup home.    Follow up for Heart Failure:  Had colonoscopy on 3/8 with 8 polyps and diverticulosis. Over the weekend she was admitted over night to [facility] with increased dyspnea. She was given IV lasix. No BRBPR. Today she says dyspnea  Has on going dyspnea with exertion.  + Orthopnea. Having chest pain on and off for the last 2 weeks. Weight at home 124 pounds.   Smoking 2-3 cigarettes per day. Taking all medications.    ECHO 09/02/13 EF 25-30%  ECHO 03/2014: EF 25-30%   Labs 05/15/13 K 3.7 Creatinine 0.97 Labs 06/02/13 K 3.9 Creatinine 1.0 Dig level 1.3 --> cut back dig to 0.0625 mg Labs 06/08/13 K 4.7 Creatinine 1.1 Dig level 0.4  Labs 06/19/13: K 4.1, creatinine 1.35 Labs 08/05/13: K 4.5, creatinine 1.17, AST 25, ALT 28, cholesterol 188, TG 185, HDL 41, LDL 110 Labs 08/17/13: K 4.9 Creatinine 2.0  Labs 09/06/13: Dig level 0.9 K 4.5 Creatinine  1.13 Labs 10/13/13 K 5.1 Creatinine 2.1 10/20/13 K 4.1 Creatinine 1.32 01/27/2014: Hgb 9.9  03/25/2014: 8.1 04/10/2014: K 3.1 Creatinine 1.2     SH: Lives with her husband. Unemployed. Stopped smoking and drinking alcohol April 2015.  FH: Father MI at the age of 6  ROS: All systems negative except as listed in HPI, PMH and Problem List.  Past Medical History  Diagnosis Date  . Diverticulosis   . Pancreatitis 1980, 2015    in the setting of ETOH  . chronic systolic heart failure     a. ECHO (05/08/13): EF 20-25%, diff HK with akinesis of basal inferior wall, grade 1 DD, trivial MR, trivial TR b) RHC (05/06/13): RA 7, RV 30/2, PA 31/19 (24), PCWP 10, Fick CO/CI: 2.9/1.74, Thermo CO/Ci: 2.4/1.4, PA sat 53%  . Ischemic cardiomyopathy   . CAD (coronary artery disease)     a. LHC (04/2013): Chronically occluded LAD, moderate dz in large OM1 and severe dz and small posterior lateral vessel    . A-fib 2015  . Suicidal overdose 10/03/13    Overdoes on Beta Blockers & Xarelto [facility])  . Anxiety 2015   . Psoriasis 1968  . GERD (gastroesophageal reflux disease) 2015   . Hyperlipidemia 2015  . Hypertension 2015  . ETOH abuse 1981    started abusing alcohol at age 47   . Dysrhythmia   . Stroke 2011    left side weakness,  minimal  . Hypothyroidism   . PONV (postoperative nausea and vomiting)     Current Outpatient Prescriptions  Medication Sig Dispense Refill  . acetaminophen (TYLENOL) 325 MG tablet Take 650 mg by mouth every 6 (six) hours as needed for fever or headache.    Marland Kitchen apixaban (ELIQUIS) 5 MG TABS tablet Take 1 tablet (5 mg total) by mouth 2 (two) times daily. 60 tablet 3  . atorvastatin (LIPITOR) 80 MG tablet Take 1 tablet (80 mg total) by mouth daily. 30 tablet 3  . carvedilol (COREG) 6.25 MG tablet Take 1 tablet (6.25 mg total) by mouth 2 (two) times daily with a meal. Hold for heart rate less than 60 60 tablet 3  . furosemide (LASIX) 20 MG tablet Take 1 tablet (20 mg total) by  mouth as needed. Take 20 mg for weight 128 pounds or greater. 30 tablet 6  . levothyroxine (SYNTHROID, LEVOTHROID) 25 MCG tablet Take 0.125 mcg by mouth daily before breakfast.    . lisinopril (PRINIVIL,ZESTRIL) 5 MG tablet Take 1 tablet (5 mg total) by mouth daily. 90 tablet 3  . LORazepam (ATIVAN) 0.5 MG tablet Take 1 tablet (0.5 mg total) by mouth 2 (two) times daily. 60 tablet 2  . nitroGLYCERIN (NITROSTAT) 0.3 MG SL tablet Place 0.3 mg under the tongue every 5 (five) minutes as needed for chest pain.    . pantoprazole (PROTONIX) 40 MG tablet TAKE 1 TABLET BY MOUTH TWICE DAILY BEFORE A MEAL. 60 tablet 5  . sertraline (ZOLOFT) 50 MG tablet Take 1 tablet (50 mg total) by mouth daily. 90 tablet 1   No current facility-administered medications for this encounter.    Filed Vitals:   04/11/14 0934  BP: 92/62  Pulse: 73  Weight: 124 lb 12.8 oz (56.609 kg)  SpO2: 99%    PHYSICAL EXAM: General: No resp difficulty. Ambulated slowly in the clinic. Caregiver present  HEENT: normal Neck: supple. JVP 5-6.  Carotids 2+ bilaterally; no bruits. No lymphadenopathy or thryomegaly appreciated. Cor: PMI normal. Regular rate & rhythm. No rubs, gallops or murmurs. Lungs: clear Abdomen: soft, nontender, nondistended. No hepatosplenomegaly. No bruits or masses. Good bowel sounds. Extremities: no cyanosis, clubbing, rash, edema Neuro: alert & orientedx3, cranial nerves grossly intact. Moves all 4 extremities w/o difficulty. Affect pleasant.    ASSESSMENT & PLAN:  1. Chronic Systolic Heart Failure: Primarily ICM thought ETOH may play a role.  EF 25-30%  (08/2013) Cardiac MRI - August 2015 No viability noted LAD territory. LBBB noted 08/2013.  ECHO 04/09/2014 EF remains 25-30%. Has LBBB  Admitted  10/03/13 -->  suicide attempt-  she  overdosed on her HF medications.  Has been back on HF meds 4 months.  She remains at a [facility] and she receives all medications from the staff.  - Today she is complaining of  increased dyspnea, fatigue, and intermittent chest pain. This may be the result of a new blockage, low output, or anemia. Will need RHC/LHC to sort out.  NYHA IIIB. Volume status is stable. Continue  20 mg lasix as needed for weight 128 or greater .   - Continue carvedilol 6.25 mg twice a day.  -On lisinopril 5 mg daily.  - In the past she developed hyperkalemia on spironolactone.  -  2. CAD: LHC with chronically occluded LAD, moderate disease in a large OM 2 and severe disease and small posterior lateral vessel. CMRI - no viability in LAD territory. No aspirin as she is on  Apixaban today.  Apixaban on hold due to cath this week.  No longer on  Xarelto as she had hemoptysis.  Continue  statin. Remains on carvedilol.   3. Current smoker: Encouraged to stop smoking.  Declines smoking cessation.   4. Former Alcohol Abuse- Quit April 2015. .  5.  PAF- maintaining NSR. Off amiodarone since July. Xarelto stopped last viist due to hemoptysis. This has resolved.and she was evaluated by GI. Discussed with Dr Aundra Dubin . Hold  apixaban until after cath.  Check CBC today 6. Bradycardia- After BB overdose. That has resolved.  7. Suicide Attempt-  Hospitalized in September for suicide attempt --> took all HF medications. She is currently on list for [facility] counseling. Continue daily Zoloft.  8. Hemoptysis- off Xarelto and aspirin. Continue  protonix. 40 mg daily .  11/2013 EGD with no source of melena.  03/29/2014 Colonoscopy with 8 polyps and diverticulosis. Check hgb now. Hold apixaban until after cath.   Labs Hgb 7.7 Hct 25.3 K 4.6 --> Today apixaban was stopped for cath. She will need repeat H/H on Thursday.  Plan for RHC/LHC on Thursday to reassess coronaries and hemodynamics.  Follow up in 2 weeks.    Georgene Kopper NP-C 9:38 AM

## 2014-04-11 NOTE — Patient Instructions (Signed)
Your physician has requested that you have a cardiac catheterization. Cardiac catheterization is used to diagnose and/or treat various heart conditions. Doctors may recommend this procedure for a number of different reasons. The most common reason is to evaluate chest pain. Chest pain can be a symptom of coronary artery disease (CAD), and cardiac catheterization can show whether plaque is narrowing or blocking your heart's arteries. This procedure is also used to evaluate the valves, as well as measure the blood flow and oxygen levels in different parts of your heart. For further information please visit [email or web address]. Please follow instruction sheet, as given.  Your physician recommends that you schedule a follow-up appointment in: 2 weeks  Do the following things EVERYDAY: 1) Weigh yourself in the morning before breakfast. Write it down and keep it in a log. 2) Take your medicines as prescribed 3) Eat low salt foods-Limit salt (sodium) to 2000 mg per day.  4) Stay as active as you can everyday 5) Limit all fluids for the day to less than 2 liters 6)

## 2014-04-12 NOTE — Progress Notes (Signed)
UR chart review completed.  

## 2014-04-13 ENCOUNTER — Encounter: Payer: Self-pay | Admitting: Gastroenterology

## 2014-04-13 ENCOUNTER — Other Ambulatory Visit: Payer: Self-pay | Admitting: *Deleted

## 2014-04-13 ENCOUNTER — Telehealth: Payer: Self-pay | Admitting: Gastroenterology

## 2014-04-13 NOTE — Telephone Encounter (Signed)
Please call pt. She had simple adenomas removed. HER IRON STORES ARE NORMAL BUT HER BLOOD COUNT REMAINS LOW.  NO DEFINITE SOURCE FOR YOUR ANEMIA HAS BEEN IDENTIFIED. SHE NEEDS A GIVENS CAPSULE STUDY(OBSCURE GI BLEED/TRANSFUSION DEPENDENT ANEMIA) TO COMPLETE THE EVALUATION FOR HER LOW BLOOD COUNT.   FOLLOW A HIGH FIBER DIET. AVOID ITEMS THAT CAUSE BLOATING.  OPV IN 4 MOS E30 OBSCURE GI BLEED/ANEMIA. Next colonoscopy in 3 years. YOUR SISTERS, BROTHERS, CHILDREN, AND PARENTS NEED TO HAVE A COLONOSCOPY STARTING AT THE AGE OF 40.

## 2014-04-14 ENCOUNTER — Encounter (HOSPITAL_COMMUNITY)
Admission: RE | Disposition: A | Discharge status: Home / Self Care | Payer: Self-pay | Source: Ambulatory Visit | Attending: Cardiology

## 2014-04-14 ENCOUNTER — Ambulatory Visit (HOSPITAL_COMMUNITY)
Admission: RE | Admit: 2014-04-14 | Discharge: 2014-04-14 | Disposition: A | Discharge status: Home / Self Care | Payer: Medicaid Other | Source: Ambulatory Visit | Attending: Cardiology | Admitting: Cardiology

## 2014-04-14 ENCOUNTER — Encounter (HOSPITAL_COMMUNITY): Payer: Self-pay | Admitting: Cardiology

## 2014-04-14 ENCOUNTER — Other Ambulatory Visit: Payer: Self-pay

## 2014-04-14 DIAGNOSIS — F17210 Nicotine dependence, cigarettes, uncomplicated: Secondary | ICD-10-CM | POA: Diagnosis not present

## 2014-04-14 DIAGNOSIS — Z8673 Personal history of transient ischemic attack (TIA), and cerebral infarction without residual deficits: Secondary | ICD-10-CM | POA: Diagnosis not present

## 2014-04-14 DIAGNOSIS — E039 Hypothyroidism, unspecified: Secondary | ICD-10-CM | POA: Diagnosis not present

## 2014-04-14 DIAGNOSIS — I2582 Chronic total occlusion of coronary artery: Secondary | ICD-10-CM | POA: Diagnosis not present

## 2014-04-14 DIAGNOSIS — I10 Essential (primary) hypertension: Secondary | ICD-10-CM | POA: Diagnosis not present

## 2014-04-14 DIAGNOSIS — Z7901 Long term (current) use of anticoagulants: Secondary | ICD-10-CM | POA: Insufficient documentation

## 2014-04-14 DIAGNOSIS — I5022 Chronic systolic (congestive) heart failure: Secondary | ICD-10-CM | POA: Diagnosis not present

## 2014-04-14 DIAGNOSIS — D649 Anemia, unspecified: Secondary | ICD-10-CM | POA: Insufficient documentation

## 2014-04-14 DIAGNOSIS — E785 Hyperlipidemia, unspecified: Secondary | ICD-10-CM | POA: Insufficient documentation

## 2014-04-14 DIAGNOSIS — I480 Paroxysmal atrial fibrillation: Secondary | ICD-10-CM | POA: Diagnosis not present

## 2014-04-14 DIAGNOSIS — R079 Chest pain, unspecified: Secondary | ICD-10-CM | POA: Diagnosis present

## 2014-04-14 DIAGNOSIS — K219 Gastro-esophageal reflux disease without esophagitis: Secondary | ICD-10-CM | POA: Insufficient documentation

## 2014-04-14 DIAGNOSIS — R0609 Other forms of dyspnea: Secondary | ICD-10-CM | POA: Diagnosis present

## 2014-04-14 DIAGNOSIS — L409 Psoriasis, unspecified: Secondary | ICD-10-CM | POA: Insufficient documentation

## 2014-04-14 DIAGNOSIS — I2510 Atherosclerotic heart disease of native coronary artery without angina pectoris: Secondary | ICD-10-CM | POA: Insufficient documentation

## 2014-04-14 DIAGNOSIS — I255 Ischemic cardiomyopathy: Secondary | ICD-10-CM | POA: Insufficient documentation

## 2014-04-14 DIAGNOSIS — F419 Anxiety disorder, unspecified: Secondary | ICD-10-CM | POA: Diagnosis not present

## 2014-04-14 DIAGNOSIS — Z79899 Other long term (current) drug therapy: Secondary | ICD-10-CM | POA: Diagnosis not present

## 2014-04-14 DIAGNOSIS — I509 Heart failure, unspecified: Secondary | ICD-10-CM | POA: Diagnosis not present

## 2014-04-14 DIAGNOSIS — R042 Hemoptysis: Secondary | ICD-10-CM | POA: Diagnosis not present

## 2014-04-14 HISTORY — PX: LEFT AND RIGHT HEART CATHETERIZATION WITH CORONARY ANGIOGRAM: SHX5449

## 2014-04-14 LAB — CBC
HCT: 27.9 % — ABNORMAL LOW (ref 36.0–46.0)
Hemoglobin: 8.6 g/dL — ABNORMAL LOW (ref 12.0–15.0)
MCH: 25.9 pg — ABNORMAL LOW (ref 26.0–34.0)
MCHC: 30.8 g/dL (ref 30.0–36.0)
MCV: 84 fL (ref 78.0–100.0)
Platelets: 298 10*3/uL (ref 150–400)
RBC: 3.32 MIL/uL — AB (ref 3.87–5.11)
RDW: 14.3 % (ref 11.5–15.5)
WBC: 4.7 10*3/uL (ref 4.0–10.5)

## 2014-04-14 LAB — BASIC METABOLIC PANEL
Anion gap: 11 (ref 5–15)
BUN: 17 mg/dL (ref 6–23)
CHLORIDE: 107 mmol/L (ref 96–112)
CO2: 23 mmol/L (ref 19–32)
CREATININE: 1.08 mg/dL (ref 0.50–1.10)
Calcium: 9.7 mg/dL (ref 8.4–10.5)
GFR calc Af Amer: 64 mL/min — ABNORMAL LOW (ref >90)
GFR calc non Af Amer: 55 mL/min — ABNORMAL LOW (ref >90)
Glucose, Bld: 92 mg/dL (ref 70–99)
POTASSIUM: 4.4 mmol/L (ref 3.5–5.1)
SODIUM: 141 mmol/L (ref 135–145)

## 2014-04-14 LAB — PROTIME-INR
INR: 1.08 (ref 0.00–1.49)
PROTHROMBIN TIME: 14.1 s (ref 11.6–15.2)

## 2014-04-14 SURGERY — LEFT AND RIGHT HEART CATHETERIZATION WITH CORONARY ANGIOGRAM

## 2014-04-14 MED ORDER — ACETAMINOPHEN 325 MG PO TABS: 650.0000 mg | ORAL_TABLET | ORAL | Status: DC | PRN

## 2014-04-14 MED ORDER — SODIUM CHLORIDE 0.9 % IJ SOLN: 3.0000 mL | Freq: Two times a day (BID) | INTRAMUSCULAR | Status: DC

## 2014-04-14 MED ORDER — LIDOCAINE HCL (PF) 1 % IJ SOLN
INTRAMUSCULAR | Status: AC
  Filled 2014-04-14: qty 30

## 2014-04-14 MED ORDER — SODIUM CHLORIDE 0.9 % IV SOLN: 250.0000 mL | INTRAVENOUS | Status: DC | PRN

## 2014-04-14 MED ORDER — MIDAZOLAM HCL 2 MG/2ML IJ SOLN
INTRAMUSCULAR | Status: AC
  Filled 2014-04-14: qty 2

## 2014-04-14 MED ORDER — HEPARIN (PORCINE) IN NACL 2-0.9 UNIT/ML-% IJ SOLN
INTRAMUSCULAR | Status: AC
  Filled 2014-04-14: qty 1000

## 2014-04-14 MED ORDER — ONDANSETRON HCL 4 MG/2ML IJ SOLN: 4.0000 mg | Freq: Four times a day (QID) | INTRAMUSCULAR | Status: DC | PRN

## 2014-04-14 MED ORDER — FENTANYL CITRATE 0.05 MG/ML IJ SOLN
INTRAMUSCULAR | Status: AC
  Filled 2014-04-14: qty 2

## 2014-04-14 MED ORDER — ASPIRIN 81 MG PO CHEW
CHEWABLE_TABLET | ORAL | Status: AC
  Filled 2014-04-14: qty 1

## 2014-04-14 MED ORDER — HEPARIN SODIUM (PORCINE) 1000 UNIT/ML IJ SOLN
INTRAMUSCULAR | Status: AC
  Filled 2014-04-14: qty 1

## 2014-04-14 MED ORDER — SODIUM CHLORIDE 0.9 % IJ SOLN: 3.0000 mL | INTRAMUSCULAR | Status: DC | PRN

## 2014-04-14 MED ORDER — SODIUM CHLORIDE 0.9 % IV SOLN: INTRAVENOUS | Status: DC

## 2014-04-14 MED ORDER — VERAPAMIL HCL 2.5 MG/ML IV SOLN
INTRAVENOUS | Status: AC
Start: 2014-04-14 — End: 2014-04-14
  Filled 2014-04-14: qty 2

## 2014-04-14 MED ORDER — ASPIRIN 81 MG PO CHEW: 81.0000 mg | CHEWABLE_TABLET | ORAL | Status: DC

## 2014-04-14 NOTE — Telephone Encounter (Signed)
Called to speak to pt and spoke with someone from [facility].  States that pt is in the hospital. Pt is having a Cardiac Cath. Done today. Unable to schedule givens at this time.

## 2014-04-14 NOTE — Telephone Encounter (Signed)
Reminder in epic °

## 2014-04-14 NOTE — Discharge Instructions (Signed)
Radial Site Care °Refer to this sheet in the next few weeks. These instructions provide you with information on caring for yourself after your procedure. Your caregiver may also give you more specific instructions. Your treatment has been planned according to current medical practices, but problems sometimes occur. Call your caregiver if you have any problems or questions after your procedure. °HOME CARE INSTRUCTIONS °· You may shower the day after the procedure. Remove the bandage (dressing) and gently wash the site with plain soap and water. Gently pat the site dry. °· Do not apply powder or lotion to the site. °· Do not submerge the affected site in water for 3 to 5 days. °· Inspect the site at least twice daily. °· Do not flex or bend the affected arm for 24 hours. °· No lifting over 5 pounds (2.3 kg) for 5 days after your procedure. °· Do not drive home if you are discharged the same day of the procedure. Have someone else drive you. °· You may drive 24 hours after the procedure unless otherwise instructed by your caregiver. °· Do not operate machinery or power tools for 24 hours. °· A responsible adult should be with you for the first 24 hours after you arrive home. °What to expect: °· Any bruising will usually fade within 1 to 2 weeks. °· Blood that collects in the tissue (hematoma) may be painful to the touch. It should usually decrease in size and tenderness within 1 to 2 weeks. °SEEK IMMEDIATE MEDICAL CARE IF: °· You have unusual pain at the radial site. °· You have redness, warmth, swelling, or pain at the radial site. °· You have drainage (other than a small amount of blood on the dressing). °· You have chills. °· You have a fever or persistent symptoms for more than 72 hours. °· You have a fever and your symptoms suddenly get worse. °· Your arm becomes pale, cool, tingly, or numb. °· You have heavy bleeding from the site. Hold pressure on the site. °Document Released: 02/09/2010 Document Revised:  04/01/2011 Document Reviewed: 02/09/2010 °ExitCare® Patient Information ©2015 [employer]. This information is not intended to replace advice given to you by your health care provider. Make sure you discuss any questions you have with your health care provider. ° °

## 2014-04-14 NOTE — Telephone Encounter (Signed)
REVIEWED-NO ADDITIONAL RECOMMENDATIONS. 

## 2014-04-14 NOTE — H&P (View-Only) (Signed)
Patient ID: [name], female   DOB: Jun 01, 1955, 59 y.o.   MRN: [id number]  PCP: N/A Primary Cardiologist: Dr. Percival Spanish  HPI: [name] is a 59 y/o female with a history of ETOH abuse, ICM, HTN, stroke and chronic systolic heart failure.  Admitted to [facility] 05/06/13 for pancreatitis. She had severely reduced systolic function with an EF of 20-25%. 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 Discharge weight was 140 pounds.   Admitted to [facility] 05/2013 with A fib RVR. Started on amiodarone 400 mg tiwce a day and Xarelto 20 mg daily. Chemically converted to NSR.  Admitted to [facility]  08/2013 wit chest pain. Troponin was elelvated. LHC was unchanged from April 2015 so she had CMRI that showed LAD territory was not viable. Plan to continue to treat medically.   Admitted 9/13 through 9/23 after attempted suicide. Overdosed on all HF medications. Discharged off carvedilol, lasix, lisinopril, xarelto and digoxin. Discharge weight 120 pounds. Discharged to [facility] which is a goup home.    Follow up for Heart Failure:  Had colonoscopy on 3/8 with 8 polyps and diverticulosis. Over the weekend she was admitted over night to [facility] with increased dyspnea. She was given IV lasix. No BRBPR. Today she says dyspnea  Has on going dyspnea with exertion.  + Orthopnea. Having chest pain on and off for the last 2 weeks. Weight at home 124 pounds.   Smoking 2-3 cigarettes per day. Taking all medications.    ECHO 09/02/13 EF 25-30%  ECHO 03/2014: EF 25-30%   Labs 05/15/13 K 3.7 Creatinine 0.97 Labs 06/02/13 K 3.9 Creatinine 1.0 Dig level 1.3 --> cut back dig to 0.0625 mg Labs 06/08/13 K 4.7 Creatinine 1.1 Dig level 0.4  Labs 06/19/13: K 4.1, creatinine 1.35 Labs 08/05/13: K 4.5, creatinine 1.17, AST 25, ALT 28, cholesterol 188, TG 185, HDL 41, LDL 110 Labs 08/17/13: K 4.9 Creatinine 2.0  Labs 09/06/13: Dig level 0.9 K 4.5 Creatinine  1.13 Labs 10/13/13 K 5.1 Creatinine 2.1 10/20/13 K 4.1 Creatinine 1.32 01/27/2014: Hgb 9.9  03/25/2014: 8.1 04/10/2014: K 3.1 Creatinine 1.2     SH: Lives with her husband. Unemployed. Stopped smoking and drinking alcohol April 2015.  FH: Father MI at the age of 1  ROS: All systems negative except as listed in HPI, PMH and Problem List.  Past Medical History  Diagnosis Date  . Diverticulosis   . Pancreatitis 1980, 2015    in the setting of ETOH  . chronic systolic heart failure     a. ECHO (05/08/13): EF 20-25%, diff HK with akinesis of basal inferior wall, grade 1 DD, trivial MR, trivial TR b) RHC (05/06/13): RA 7, RV 30/2, PA 31/19 (24), PCWP 10, Fick CO/CI: 2.9/1.74, Thermo CO/Ci: 2.4/1.4, PA sat 53%  . Ischemic cardiomyopathy   . CAD (coronary artery disease)     a. LHC (04/2013): Chronically occluded LAD, moderate dz in large OM1 and severe dz and small posterior lateral vessel    . A-fib 2015  . Suicidal overdose 10/03/13    Overdoes on Beta Blockers & Xarelto [facility] ED)  . Anxiety 2015   . Psoriasis 1968  . GERD (gastroesophageal reflux disease) 2015   . Hyperlipidemia 2015  . Hypertension 2015  . ETOH abuse 1981    started abusing alcohol at age 1   . Dysrhythmia   . Stroke 2011    left side weakness,  minimal  . Hypothyroidism   . PONV (postoperative nausea and vomiting)     Current Outpatient Prescriptions  Medication Sig Dispense Refill  . acetaminophen (TYLENOL) 325 MG tablet Take 650 mg by mouth every 6 (six) hours as needed for fever or headache.    Marland Kitchen apixaban (ELIQUIS) 5 MG TABS tablet Take 1 tablet (5 mg total) by mouth 2 (two) times daily. 60 tablet 3  . atorvastatin (LIPITOR) 80 MG tablet Take 1 tablet (80 mg total) by mouth daily. 30 tablet 3  . carvedilol (COREG) 6.25 MG tablet Take 1 tablet (6.25 mg total) by mouth 2 (two) times daily with a meal. Hold for heart rate less than 60 60 tablet 3  . furosemide (LASIX) 20 MG tablet Take 1 tablet (20 mg total) by  mouth as needed. Take 20 mg for weight 128 pounds or greater. 30 tablet 6  . levothyroxine (SYNTHROID, LEVOTHROID) 25 MCG tablet Take 0.125 mcg by mouth daily before breakfast.    . lisinopril (PRINIVIL,ZESTRIL) 5 MG tablet Take 1 tablet (5 mg total) by mouth daily. 90 tablet 3  . LORazepam (ATIVAN) 0.5 MG tablet Take 1 tablet (0.5 mg total) by mouth 2 (two) times daily. 60 tablet 2  . nitroGLYCERIN (NITROSTAT) 0.3 MG SL tablet Place 0.3 mg under the tongue every 5 (five) minutes as needed for chest pain.    . pantoprazole (PROTONIX) 40 MG tablet TAKE 1 TABLET BY MOUTH TWICE DAILY BEFORE A MEAL. 60 tablet 5  . sertraline (ZOLOFT) 50 MG tablet Take 1 tablet (50 mg total) by mouth daily. 90 tablet 1   No current facility-administered medications for this encounter.    Filed Vitals:   04/11/14 0934  BP: 92/62  Pulse: 73  Weight: 124 lb 12.8 oz (56.609 kg)  SpO2: 99%    PHYSICAL EXAM: General: No resp difficulty. Ambulated slowly in the clinic. Caregiver present  HEENT: normal Neck: supple. JVP 5-6.  Carotids 2+ bilaterally; no bruits. No lymphadenopathy or thryomegaly appreciated. Cor: PMI normal. Regular rate & rhythm. No rubs, gallops or murmurs. Lungs: clear Abdomen: soft, nontender, nondistended. No hepatosplenomegaly. No bruits or masses. Good bowel sounds. Extremities: no cyanosis, clubbing, rash, edema Neuro: alert & orientedx3, cranial nerves grossly intact. Moves all 4 extremities w/o difficulty. Affect pleasant.    ASSESSMENT & PLAN:  1. Chronic Systolic Heart Failure: Primarily ICM thought ETOH may play a role.  EF 25-30%  (08/2013) Cardiac MRI - August 2015 No viability noted LAD territory. LBBB noted 08/2013.  ECHO 04/09/2014 EF remains 25-30%. Has LBBB  Admitted  10/03/13 -->  suicide attempt-  she  overdosed on her HF medications.  Has been back on HF meds 4 months.  She remains at a [facility] and she receives all medications from the staff.  - Today she is complaining of  increased dyspnea, fatigue, and intermittent chest pain. This may be the result of a new blockage, low output, or anemia. Will need RHC/LHC to sort out.  NYHA IIIB. Volume status is stable. Continue  20 mg lasix as needed for weight 128 or greater .   - Continue carvedilol 6.25 mg twice a day.  -On lisinopril 5 mg daily.  - In the past she developed hyperkalemia on spironolactone.  -  2. CAD: LHC with chronically occluded LAD, moderate disease in a large OM 2 and severe disease and small posterior lateral vessel. CMRI - no viability in LAD territory. No aspirin as she is on  Apixaban today.  Apixaban on hold due to cath this week.  No longer on  Xarelto as she had hemoptysis.  Continue  statin. Remains on carvedilol.   3. Current smoker: Encouraged to stop smoking.  Declines smoking cessation.   4. Former Alcohol Abuse- Quit April 2015. .  5.  PAF- maintaining NSR. Off amiodarone since July. Xarelto stopped last viist due to hemoptysis. This has resolved.and she was evaluated by GI. Discussed with Dr Aundra Dubin . Hold  apixaban until after cath.  Check CBC today 6. Bradycardia- After BB overdose. That has resolved.  7. Suicide Attempt-  Hospitalized in September for suicide attempt --> took all HF medications. She is currently on [facility] for [facility]. Continue daily Zoloft.  8. Hemoptysis- off Xarelto and aspirin. Continue  protonix. 40 mg daily .  11/2013 EGD with no source of melena.  03/29/2014 Colonoscopy with 8 polyps and diverticulosis. Check hgb now. Hold apixaban until after cath.   Labs Hgb 7.7 Hct 25.3 K 4.6 --> Today apixaban was stopped for cath. She will need repeat H/H on Thursday.  Plan for RHC/LHC on Thursday to reassess coronaries and hemodynamics.  Follow up in 2 weeks.    Zaccheaus Storlie NP-C 9:38 AM

## 2014-04-14 NOTE — Interval H&P Note (Signed)
History and Physical Interval Note:  04/14/2014 1:33 PM  [name]  has presented today for surgery, with the diagnosis of cp/chf  The various methods of treatment have been discussed with the patient and family. After consideration of risks, benefits and other options for treatment, the patient has consented to  Procedure(s): LEFT AND RIGHT HEART CATHETERIZATION WITH CORONARY ANGIOGRAM (N/A) as a surgical intervention .  The patient's history has been reviewed, patient examined, no change in status, stable for surgery.  I have reviewed the patient's chart and labs.  Questions were answered to the patient's satisfaction.     Loletta Harper [employer]

## 2014-04-14 NOTE — Progress Notes (Signed)
CAD Assessment (Coronary Angiography With or Without Left Heart Catheterization and/or Left Ventriculography)  Patient Information:    Patients With Known Obstructive CAD (e.g., Prior MI, Prior PCI, Prior CABG, or Obstructive Disease on Invasive Angiography)   Medically Managed Patient (No prior PCI, no prior CABG)   High-risk noninvasive findings   Asymptomatic/Controlled Symptoms OR Unchanged Findings  AUC Score:   A (7)   Indication:   51

## 2014-04-14 NOTE — Telephone Encounter (Signed)
LMOM to call. Mailing a letter also.

## 2014-04-14 NOTE — CV Procedure (Addendum)
    Cardiac Catheterization Procedure Note  Name: [name] MRN: [id number] DOB: [birth year]  Procedure: Right Heart Cath, Left Heart Cath, Selective Coronary Angiography  Indication: Increased exertional dyspnea and chest pain.  Known CAD and systolic CHF.    Procedural Details: The right brachial and radial areas were prepped, draped, and anesthetized with 1% lidocaine. There was a pre-existing peripheral IV in right brachial vein.  This was replaced by a 5 French venous sheath.  A Swan-Ganz catheter was used for the right heart catheterization. Standard protocol was followed for recording of right heart pressures and sampling of oxygen saturations. Fick cardiac output was calculated. The right radial artery was entered using modified Seldinger technique and a 6 French sheath was placed.  The patient received 3 mg IA verapamil and weight-based IV heparin. Standard Judkins catheters were used for selective coronary angiography. There were no immediate procedural complications. The patient was transferred to the post catheterization recovery area for further monitoring.  Procedural Findings: Hemodynamics (mmHg) RA mean 2 RV 45/6 PA 45/21, mean 34 PCWP mean 21 LV 121/18 AO 123/57  Oxygen saturations: PA 50% AO 91%  Cardiac Output (Fick) 4.38  Cardiac Index (Fick) 2.77 PVR 2.96 WU  Coronary angiography: Coronary dominance: right  Left mainstem: No significant disease.   Left anterior descending (LAD): Totally occluded [place] with some collaterals (not robust) from the RCA.   Left circumflex (LCx): Moderate ramus with 30% ostial stenosis.  Large OM1 with 40-50% ostial stenosis (looks improved compared to prior study).   Right coronary artery (RCA): The RCA divides into PLV branch and PDA.  There is 90% complex ostial stenosis of the PLV branch.  This is unchanged.   Left ventriculography: Not done.  Final Conclusions:  Preserved cardiac output.  PCWP is mildly elevated but  RA pressure normal.  She seems well-compensated, would not change meds.  Coronary anatomy is unchanged.  Known totally occluded LAD without viability in the LAD territory.  Known 90% complex PLV stenosis.  Would continue medical management.  I suspect that her increased symptoms may be due to anemia.  She has had recent extensive GI evaluation.   I am going to refer her to EP for CRT-D evaluation.  Restart Eliquis in morning.    [name] 04/14/2014, 2:09 PM

## 2014-04-14 NOTE — Progress Notes (Signed)
Right antecubital venous sheath removed and pressure held x 44min and no bleeding or hematoma

## 2014-04-15 LAB — POCT I-STAT 3, VENOUS BLOOD GAS (G3P V)
ACID-BASE DEFICIT: 3 mmol/L — AB (ref 0.0–2.0)
Bicarbonate: 22.3 mEq/L (ref 20.0–24.0)
O2 SAT: 50 %
TCO2: 23 mmol/L (ref 0–100)
pCO2, Ven: 38.2 mmHg — ABNORMAL LOW (ref 45.0–50.0)
pH, Ven: 7.374 — ABNORMAL HIGH (ref 7.250–7.300)
pO2, Ven: 27 mmHg — CL (ref 30.0–45.0)

## 2014-04-25 ENCOUNTER — Inpatient Hospital Stay (HOSPITAL_COMMUNITY): Admission: RE | Admit: 2014-04-25 | Payer: Self-pay | Source: Ambulatory Visit

## 2014-05-03 ENCOUNTER — Telehealth: Payer: Self-pay | Admitting: Licensed Clinical Social Worker

## 2014-05-03 NOTE — Telephone Encounter (Signed)
Patient contacted CSW to inform of her recent approval for SSI. Patient is thrilled to have finally been approved. Patient stated that the group home ([facility]) reported to her that she will have to begin monthly payment for her stay now that she has been approved and has her own income. Patient verbalizes understanding of this and denies any other concerns at this time. CSW will continue to provide support as needed. Raquel Sarna, [place]

## 2014-05-06 ENCOUNTER — Ambulatory Visit (HOSPITAL_COMMUNITY)
Admission: RE | Admit: 2014-05-06 | Discharge: 2014-05-06 | Disposition: A | Discharge status: Home / Self Care | Payer: Medicaid Other | Source: Ambulatory Visit | Attending: Cardiology | Admitting: Cardiology

## 2014-05-06 VITALS — BP 132/68 | HR 87 | Wt 126.0 lb

## 2014-05-06 DIAGNOSIS — E785 Hyperlipidemia, unspecified: Secondary | ICD-10-CM | POA: Diagnosis not present

## 2014-05-06 DIAGNOSIS — T1491 Suicide attempt: Secondary | ICD-10-CM | POA: Insufficient documentation

## 2014-05-06 DIAGNOSIS — F1021 Alcohol dependence, in remission: Secondary | ICD-10-CM | POA: Insufficient documentation

## 2014-05-06 DIAGNOSIS — I480 Paroxysmal atrial fibrillation: Secondary | ICD-10-CM | POA: Diagnosis not present

## 2014-05-06 DIAGNOSIS — I5022 Chronic systolic (congestive) heart failure: Secondary | ICD-10-CM

## 2014-05-06 DIAGNOSIS — Z720 Tobacco use: Secondary | ICD-10-CM | POA: Insufficient documentation

## 2014-05-06 DIAGNOSIS — I2510 Atherosclerotic heart disease of native coronary artery without angina pectoris: Secondary | ICD-10-CM | POA: Diagnosis not present

## 2014-05-06 MED ORDER — FUROSEMIDE 20 MG PO TABS: 20.0000 mg | ORAL_TABLET | ORAL | Status: DC

## 2014-05-06 MED ORDER — ATORVASTATIN CALCIUM 80 MG PO TABS: 80.0000 mg | ORAL_TABLET | Freq: Every day | ORAL | Status: DC

## 2014-05-06 MED ORDER — LISINOPRIL 5 MG PO TABS: 5.0000 mg | ORAL_TABLET | Freq: Every day | ORAL | Status: DC

## 2014-05-06 MED ORDER — SPIRONOLACTONE 25 MG PO TABS: 12.5000 mg | ORAL_TABLET | Freq: Every day | ORAL | Status: DC

## 2014-05-06 NOTE — Patient Instructions (Signed)
Increase Atorvastatin to 80 mg daily  Change Furosemide to 20 mg every other day   Start Spironolactone 12.5 mg (1/2 tab) daily  Labs in 1 week  You have been referred back to Dr Rayann Heman to discuss ICD  We will contact you in 2 months to schedule your next appointment.

## 2014-05-07 NOTE — Progress Notes (Signed)
Patient ID: [name], female   DOB: [birth year], 59 y.o.   MRN: [id number] PCP: N/A  HPI: [name] is a 59 y/o female with a history of ETOH abuse, ICM, HTN, stroke and chronic systolic heart failure.  Admitted to [facility] 05/06/13 for pancreatitis. She had severely reduced systolic function with an EF of 20-25%. 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 Discharge weight was 140 pounds.   Admitted to [facility] 05/2013 with A fib RVR. Started on amiodarone 400 mg twice a day and Xarelto 20 mg daily. Chemically converted to NSR.  Admitted to [facility]  08/2013 with chest pain. Troponin was elelvated. LHC was unchanged from April 2015 so she had CMRI that showed LAD territory was not viable. Plan to continue to treat medically.   Admitted 9/13 through 10/13/13 after attempted suicide. Overdosed on all HF medications. Discharged off carvedilol, lasix, lisinopril, xarelto and digoxin. Discharge weight 120 pounds. Discharged to [facility] which is a group home.  She has been doing much better since that time and is on sertraline.  Had colonoscopy on 03/29/14 with 8 polyps and diverticulosis. She developed increased dyspnea and had LHC/RHC in 3/16 showing mean RA 2, PA 45/21 (mean 34), PCWP mean 21, CI 2.77; LAD totally occluded with some collaterals from RCA, 40-50% OM1, 90% complex ostial PLV. No significant change on coronary angiography.  I thought that her dyspnea may have been due to worsened anemia.  Currently, she has been having rare atypical chest pain.  She is taking Lasix every 3rd day.  Breathing better, no problems on flat ground.  No orthopnea or PND.  No melena, no BRBPR.  She still smokes 2-3 cigarettes/day.    ECHO 09/02/13 EF 25-30%  ECHO 03/2014: EF 25-30%   Labs 05/15/13 K 3.7 Creatinine 0.97 Labs 06/02/13 K 3.9 Creatinine 1.0 Dig level 1.3 --> cut back dig to 0.0625 mg Labs 06/08/13 K 4.7 Creatinine 1.1 Dig  level 0.4  Labs 06/19/13: K 4.1, creatinine 1.35 Labs 08/05/13: K 4.5, creatinine 1.17, AST 25, ALT 28, cholesterol 188, TG 185, HDL 41, LDL 110 Labs 08/17/13: K 4.9 Creatinine 2.0  Labs 09/06/13: Dig level 0.9 K 4.5 Creatinine 1.13 Labs 10/13/13 K 5.1 Creatinine 2.1 Labs 10/20/13 K 4.1 Creatinine 1.32 Labs 01/27/2014: Hgb 9.9, LDL 108  Labs 3/16: K 4.4, creatinine 1.08, hgb 8.6  ECG: IVCD, QRS 150 msec   SH: Lives in a group home. Unemployed. Stopped smoking and drinking alcohol April 2015.   FH: Father MI at the age of 51  ROS: All systems negative except as listed in HPI, PMH and Problem List.  Past Medical History  Diagnosis Date  . Diverticulosis   . Pancreatitis 1980, 2015    in the setting of ETOH  . chronic systolic heart failure     a. ECHO (05/08/13): EF 20-25%, diff HK with akinesis of basal inferior wall, grade 1 DD, trivial MR, trivial TR b) RHC (05/06/13): RA 7, RV 30/2, PA 31/19 (24), PCWP 10, Fick CO/CI: 2.9/1.74, Thermo CO/Ci: 2.4/1.4, PA sat 53%  . Ischemic cardiomyopathy   . CAD (coronary artery disease)     a. LHC (04/2013): Chronically occluded LAD, moderate dz in large OM1 and severe dz and small posterior lateral vessel    . A-fib 2015  . Suicidal overdose 10/03/13    Overdoes on Beta Blockers & Xarelto [facility] ED)  . Anxiety 2015   .  Psoriasis 1968  . GERD (gastroesophageal reflux disease) 2015   . Hyperlipidemia 2015  . Hypertension 2015  . ETOH abuse 1981    started abusing alcohol at age 72   . Dysrhythmia   . Stroke 2011    left side weakness, minimal  . Hypothyroidism   . PONV (postoperative nausea and vomiting)     Current Outpatient Prescriptions  Medication Sig Dispense Refill  . acetaminophen (TYLENOL) 325 MG tablet Take 650 mg by mouth every 6 (six) hours as needed for fever or headache.    Marland Kitchen apixaban (ELIQUIS) 5 MG TABS tablet Take 1 tablet (5 mg total) by mouth 2 (two) times daily. 60 tablet 3  . atorvastatin (LIPITOR) 80 MG tablet Take 1 tablet  (80 mg total) by mouth daily. 30 tablet 6  . carvedilol (COREG) 6.25 MG tablet Take 1 tablet (6.25 mg total) by mouth 2 (two) times daily with a meal. Hold for heart rate less than 60 60 tablet 3  . furosemide (LASIX) 20 MG tablet Take 1 tablet (20 mg total) by mouth every other day. 15 tablet 6  . levothyroxine (SYNTHROID, LEVOTHROID) 25 MCG tablet Take 0.125 mcg by mouth daily before breakfast.    . lisinopril (PRINIVIL,ZESTRIL) 5 MG tablet Take 1 tablet (5 mg total) by mouth daily. 90 tablet 3  . LORazepam (ATIVAN) 0.5 MG tablet Take 1 tablet (0.5 mg total) by mouth 2 (two) times daily. 60 tablet 2  . nitroGLYCERIN (NITROSTAT) 0.3 MG SL tablet Place 0.3 mg under the tongue every 5 (five) minutes as needed for chest pain.    . pantoprazole (PROTONIX) 40 MG tablet TAKE 1 TABLET BY MOUTH TWICE DAILY BEFORE A MEAL. 60 tablet 5  . sertraline (ZOLOFT) 50 MG tablet Take 1 tablet (50 mg total) by mouth daily. 90 tablet 1  . spironolactone (ALDACTONE) 25 MG tablet Take 0.5 tablets (12.5 mg total) by mouth daily. 15 tablet 3   No current facility-administered medications for this encounter.    Filed Vitals:   05/06/14 1142  BP: 132/68  Pulse: 87  Weight: 126 lb (57.153 kg)  SpO2: 94%    PHYSICAL EXAM: General: No resp difficulty. Ambulated slowly in the clinic. Caregiver present  HEENT: normal Neck: supple. JVP 5-6.  Carotids 2+ bilaterally; no bruits. No lymphadenopathy or thryomegaly appreciated. Cor: PMI normal. Regular rate & rhythm. No rubs, gallops or murmurs. Lungs: clear Abdomen: soft, nontender, nondistended. No hepatosplenomegaly. No bruits or masses. Good bowel sounds. Extremities: no cyanosis, clubbing, rash, edema Neuro: alert & orientedx3, cranial nerves grossly intact. Moves all 4 extremities w/o difficulty. Affect pleasant.    ASSESSMENT & PLAN: 1. Chronic Systolic Heart Failure: Primarily ischemic cardiomyopathy though ETOH may play a role.  EF 25-30% (08/2013).  Cardiac  MRI in August 2015 showed no viability in the LAD territory. LBBB-like IVCD present on ECG.  ECHO 04/09/2014 EF remains 25-30%. Has LBBB.  NYHA class II symptoms. - Continue carvedilol 6.25 mg twice a day.  - On lisinopril 5 mg daily.  - Change Lasix to 20 mg every other day.  - I will try her again on spironolactone 12.5 mg daily. BMET/BNP in 10 days.  - Referral to EP will be made to assess for CRT-D placement (IVCD, 150 msec on today's ECG.   2. CAD: LHC with chronically occluded LAD and severe disease in small PLV.  No change on recent cath compared to prior. cMRI showed no viability in LAD territory.  No aspirin as  she is on apixaban.  Continue statin. Remains on carvedilol.   3. Current smoker: Encouraged to stop smoking.  She is working on it.   4. Former Alcohol Abuse: Quit April 2015. .  5. AF: Paroxysmal. maintaining NSR. Off amiodarone since July. She is doing ok on apixaban.  Check CBC today.  6. Suicide Attempt:  9/15 suicide attempt via overdose.  She is now on sertraline and doing much better.  7. Hyperlipidemia: Goal LDL < 70.  Increase atorvastatin to 80 mg daily with lipids/LFTs in 2 months .  [name] 05/07/2014

## 2014-05-11 ENCOUNTER — Encounter: Payer: Self-pay | Admitting: *Deleted

## 2014-05-11 ENCOUNTER — Telehealth: Payer: Self-pay | Admitting: *Deleted

## 2014-05-11 ENCOUNTER — Encounter: Payer: Self-pay | Admitting: Internal Medicine

## 2014-05-11 ENCOUNTER — Ambulatory Visit (INDEPENDENT_AMBULATORY_CARE_PROVIDER_SITE_OTHER): Payer: Medicaid Other | Admitting: Internal Medicine

## 2014-05-11 VITALS — BP 108/60 | HR 72 | Ht 63.0 in | Wt 123.8 lb

## 2014-05-11 DIAGNOSIS — I5022 Chronic systolic (congestive) heart failure: Secondary | ICD-10-CM

## 2014-05-11 DIAGNOSIS — I255 Ischemic cardiomyopathy: Secondary | ICD-10-CM | POA: Diagnosis not present

## 2014-05-11 DIAGNOSIS — I10 Essential (primary) hypertension: Secondary | ICD-10-CM | POA: Diagnosis not present

## 2014-05-11 DIAGNOSIS — I2510 Atherosclerotic heart disease of native coronary artery without angina pectoris: Secondary | ICD-10-CM

## 2014-05-11 DIAGNOSIS — Z720 Tobacco use: Secondary | ICD-10-CM

## 2014-05-11 LAB — CBC WITH DIFFERENTIAL/PLATELET
BASOS PCT: 1 % (ref 0.0–3.0)
Basophils Absolute: 0 10*3/uL (ref 0.0–0.1)
EOS ABS: 0.1 10*3/uL (ref 0.0–0.7)
EOS PCT: 2.2 % (ref 0.0–5.0)
HCT: 22.6 % — CL (ref 36.0–46.0)
Hemoglobin: 7.4 g/dL — CL (ref 12.0–15.0)
Lymphocytes Relative: 26.2 % (ref 12.0–46.0)
Lymphs Abs: 1.2 10*3/uL (ref 0.7–4.0)
MCHC: 32.9 g/dL (ref 30.0–36.0)
MCV: 75.1 fl — ABNORMAL LOW (ref 78.0–100.0)
MONO ABS: 0.5 10*3/uL (ref 0.1–1.0)
Monocytes Relative: 12.4 % — ABNORMAL HIGH (ref 3.0–12.0)
NEUTROS ABS: 2.6 10*3/uL (ref 1.4–7.7)
Neutrophils Relative %: 58.2 % (ref 43.0–77.0)
Platelets: 264 10*3/uL (ref 150.0–400.0)
RBC: 3.01 Mil/uL — AB (ref 3.87–5.11)
RDW: 14.3 % (ref 11.5–15.5)
WBC: 4.4 10*3/uL (ref 4.0–10.5)

## 2014-05-11 LAB — BASIC METABOLIC PANEL
BUN: 22 mg/dL (ref 6–23)
CO2: 26 mEq/L (ref 19–32)
CREATININE: 1.24 mg/dL — AB (ref 0.40–1.20)
Calcium: 9.5 mg/dL (ref 8.4–10.5)
Chloride: 109 mEq/L (ref 96–112)
GFR: 47.02 mL/min — AB (ref >60.00)
Glucose, Bld: 73 mg/dL (ref 70–99)
POTASSIUM: 4.1 meq/L (ref 3.5–5.1)
Sodium: 141 mEq/L (ref 135–145)

## 2014-05-11 NOTE — Progress Notes (Signed)
Electrophysiology Office Note   Date:  05/11/2014   ID:  [name], DOB [birth year], MRN [id number]  PCP:  [name] Ends, MD  Cardiologist:  Dr Aundra Dubin Primary Electrophysiologist: [name], MD    Chief Complaint  Patient presents with  . Atrial Fibrillation    Paroxysmal     History of Present Illness: [name] is a 59 y.o. female who presents today for electrophysiology evaluation.   I saw her 9/15 following an admission for suicide.  At that time, she was felt to be a poor candidate for an ICD.  She does not drink at this time.  She has occasional SOB but continues to smoke.  She is trying to quit.  She does chores without difficulty.  She does get occasionally SOB on stairs.  She was hospitalized 3/16 for CHF but has done very well with lasix adjustment.  Today, she denies symptoms of palpitations, exertional chest pain, orthopnea, PND, lower extremity edema, claudication, dizziness, presyncope, syncope, bleeding, or neurologic sequela. The patient is tolerating medications without difficulties and is otherwise without complaint today.    Past Medical History  Diagnosis Date  . Diverticulosis   . Pancreatitis 1980, 2015    in the setting of ETOH  . chronic systolic heart failure     a. ECHO (05/08/13): EF 20-25%, diff HK with akinesis of basal inferior wall, grade 1 DD, trivial MR, trivial TR b) RHC (05/06/13): RA 7, RV 30/2, PA 31/19 (24), PCWP 10, Fick CO/CI: 2.9/1.74, Thermo CO/Ci: 2.4/1.4, PA sat 53%  . Ischemic cardiomyopathy   . CAD (coronary artery disease)     a. LHC (04/2013): Chronically occluded LAD, moderate dz in large OM1 and severe dz and small posterior lateral vessel    . Persistent atrial fibrillation 2015  . Suicidal overdose 10/03/13    Overdoes on Beta Blockers & Xarelto [name])  . Anxiety 2015   . Psoriasis 1968  . GERD (gastroesophageal reflux disease) 2015   . Hyperlipidemia 2015  . Hypertension 2015  . 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 Stroke 2011    left side weakness, minimal  . Hypothyroidism   . PONV (postoperative nausea and vomiting)    Past Surgical History  Procedure Laterality Date  . Colostomy takedown  2007  . Exploratory laparotomy with colon resection, colostomy  2007  . Esophagogastroduodenoscopy (egd) with propofol N/A 11/22/2013    Dr. Oneida Alar: gastritis and duodenitis, negative H.pylori  . Esophageal biopsy N/A 11/22/2013    Procedure: GASTRIC BIOPSIES;  Surgeon: Danie Binder, MD;  Location: [facility]S;  Service: Endoscopy;  Laterality: N/A;  . Left and right heart catheterization with coronary angiogram N/A 05/10/2013    Procedure: LEFT AND RIGHT HEART CATHETERIZATION WITH CORONARY ANGIOGRAM;  Surgeon: [name], MD;  Location: [facility] CATH LAB;  Service: Cardiovascular;  Laterality: N/A;  . Left heart catheterization with coronary angiogram N/A 09/03/2013    Procedure: LEFT HEART CATHETERIZATION WITH CORONARY ANGIOGRAM;  Surgeon: Sinclair Grooms, MD;  Location: [facility] CATH LAB;  Service: Cardiovascular;  Laterality: N/A;  . Colonoscopy with propofol N/A 03/29/2014    Procedure: COLONOSCOPY WITH PROPOFOL;  Surgeon: Danie Binder, MD;  Location: [facility]S;  Service: Endoscopy;  Laterality: N/A;  Cecum time in 1101  time out   1127  total time 26 minutes  . Polypectomy N/A 03/29/2014    Procedure: POLYPECTOMY;  Surgeon: Danie Binder, MD;  Location: [facility];  Service: Endoscopy;  Laterality: N/A;  ascending colon, sigmoid colon x4, rectal x3  . Left and right heart catheterization with coronary angiogram N/A 04/14/2014    Procedure: LEFT AND RIGHT HEART CATHETERIZATION WITH CORONARY ANGIOGRAM;  Surgeon: Larey Dresser, MD;  Location: [facility] CATH LAB;  Service: Cardiovascular;  Laterality: N/A;     Current Outpatient Prescriptions  Medication Sig Dispense Refill  . acetaminophen (TYLENOL) 325 MG tablet Take 650 mg by mouth every 6 (six) hours as needed for fever or headache.    Marland Kitchen  apixaban (ELIQUIS) 5 MG TABS tablet Take 1 tablet (5 mg total) by mouth 2 (two) times daily. 60 tablet 3  . atorvastatin (LIPITOR) 80 MG tablet Take 1 tablet (80 mg total) by mouth daily. 30 tablet 6  . carvedilol (COREG) 6.25 MG tablet Take 1 tablet (6.25 mg total) by mouth 2 (two) times daily with a meal. Hold for heart rate less than 60 60 tablet 3  . furosemide (LASIX) 20 MG tablet Take 1 tablet (20 mg total) by mouth every other day. 15 tablet 6  . levothyroxine (SYNTHROID, LEVOTHROID) 25 MCG tablet Take 0.125 mcg by mouth daily before breakfast.    . lisinopril (PRINIVIL,ZESTRIL) 5 MG tablet Take 1 tablet (5 mg total) by mouth daily. 90 tablet 3  . LORazepam (ATIVAN) 0.5 MG tablet Take 1 tablet (0.5 mg total) by mouth 2 (two) times daily. 60 tablet 2  . nitroGLYCERIN (NITROSTAT) 0.3 MG SL tablet Place 0.3 mg under the tongue every 5 (five) minutes as needed for chest pain (MAX 3 TABLETS).     . pantoprazole (PROTONIX) 40 MG tablet TAKE 1 TABLET BY MOUTH TWICE DAILY BEFORE A MEAL. 60 tablet 5  . sertraline (ZOLOFT) 50 MG tablet Take 1 tablet (50 mg total) by mouth daily. 90 tablet 1  . spironolactone (ALDACTONE) 25 MG tablet Take 0.5 tablets (12.5 mg total) by mouth daily. 15 tablet 3   No current facility-administered medications for this visit.    Allergies:   Morphine and related and Penicillins   Social History:  The patient  reports that she has been smoking Cigarettes.  She has a 11.75 pack-year smoking history. She has never used smokeless tobacco. She reports that she drinks alcohol. She reports that she does not use illicit drugs.   Family History:  The patient's  family history includes Alcohol abuse in her mother; CAD (age of onset: 14) in her brother; CAD (age of onset: 66) in her father; CAD (age of onset: 22) in her sister; CAD (age of onset: 19) in her mother; Colon cancer in her mother; Diabetes in her sister. There is no history of Cancer.    ROS:  Please see the history  of present illness.   All other systems are reviewed and negative.    PHYSICAL EXAM: VS:  BP 108/60 mmHg  Pulse 72  Ht 5\' 3"  (1.6 m)  Wt 123 lb 12.8 oz (56.155 kg)  BMI 21.94 kg/m2  LMP 01/22/1988 (Approximate) , BMI Body mass index is 21.94 kg/(m^2). GEN: thin, in no acute distress HEENT: normal Neck: no JVD, carotid bruits, or masses Cardiac: RRR; no murmurs, rubs, or gallops,no edema  Respiratory:  clear to auscultation bilaterally, normal work of breathing GI: soft, nontender, nondistended, + BS MS: no deformity or atrophy Skin: warm and dry  Neuro:  Strength and sensation are intact Psych: euthymic mood, full affect  EKG:  EKG is ordered today. The ekg ordered  today shows sinus rhythm 72 bpm, PR 144, QRS 150, LBBB   Recent Labs: 09/02/2013: Pro B Natriuretic peptide (BNP) 1566.0* 10/08/2013: Magnesium 2.1 04/09/2014: ALT 9; B Natriuretic Peptide 1385.0*; TSH 4.900* 04/14/2014: BUN 17; Creatinine 1.08; Hemoglobin 8.6*; Platelets 298; Potassium 4.4; Sodium 141    Lipid Panel     Component Value Date/Time   CHOL 172 01/27/2014 1017   TRIG 96 01/27/2014 1017   HDL 45 01/27/2014 1017   CHOLHDL 3.8 01/27/2014 1017   VLDL 19 01/27/2014 1017   LDLCALC 108* 01/27/2014 1017     Wt Readings from Last 3 Encounters:  05/11/14 123 lb 12.8 oz (56.155 kg)  05/06/14 126 lb (57.153 kg)  04/14/14 124 lb (56.246 kg)      Other studies Reviewed: Additional studies/ records that were reviewed today include: echo 3/16  Review of the above records today demonstrates: EF 25% RHC/LHC 3/16 also reviewed as well as hospital records from 3/16 hospitalization.   ASSESSMENT AND PLAN:  1.  Chronic systolic dysfunction/ ischemic CM The patient has an ischemic CM (EF 25%), NYHA Class II/III CHF, and CAD.  She has made appropriate lifestyle changes and is on an appropriate medical regimen.  At this time, she meets MADIT II/ SCD-HeFT criteria for ICD implantation for primary prevention of  sudden death.  She has a LBBB QRS of 150 msec and therefore may be expected to receive benefit from CRT.  Risks, benefits, alternatives to BiV ICD implantation were discussed in detail with the patient today. The patient  understands that the risks include but are not limited to bleeding, infection, pneumothorax, perforation, tamponade, vascular damage, renal failure, MI, stroke, death, inappropriate shocks, and lead dislodgement and wishes to proceed.  We will therefore schedule device implantation at the next available time.  2. Atrial fibrillation chads2vasc score is at least 6.  She is appropriately anticoagulated with eliquis.  Will hold for 24 hours prior to implantation.  3. HTN Stable No change required today  4. ETOh Remains quit  5. Tobacco She is trying to quit Cessation is encouraged   Current medicines are reviewed at length with the patient today.   The patient does not have concerns regarding her medicines.  The following changes were made today:  none  Signed, [name], MD  05/11/2014 10:09 AM     [facility] [address] [place] [address] [phone] (office) [phone] (fax)

## 2014-05-11 NOTE — Telephone Encounter (Signed)
Lab faxed to Kathleene Hazel, NP [phone]

## 2014-05-11 NOTE — Telephone Encounter (Signed)
Spoke with [name] and let her know the procedure for Fri will need to be canceled until she can follow up with GI Dr Oneida Alar

## 2014-05-11 NOTE — Telephone Encounter (Signed)
lmom for her to return my call about her abnormal labs  HbG 7.4.  Dr Rayann Heman spoke with Dr Prentice Docker nd they agree she needs to be cleared by GI before proceeding with BiV ICD

## 2014-05-11 NOTE — Patient Instructions (Addendum)
Your physician has recommended that you have a defibrillator inserted. An implantable cardioverter defibrillator (ICD) is a small device that is placed in your chest or, in rare cases, your abdomen. This device uses electrical pulses or shocks to help control life-threatening, irregular heartbeats that could lead the heart to suddenly stop beating (sudden cardiac arrest). Leads are attached to the ICD that goes into your heart. This is done in the hospital and usually requires an overnight stay. Please see the instruction sheet given to you today for more information.  Your physician recommends that you schedule a follow-up appointment in: 7-10 days from 05/13/14 in device clinic for wound check   Your physician recommends that you return for lab work today BMP/CBC  See instruction sheet for procedure  Your physician recommends that you continue on your current medications as directed. Please refer to the Current Medication list given to you today.   Cardioverter Defibrillator Implantation An implantable cardioverter defibrillator (ICD) is a small, lightweight, battery-powered device that is placed (implanted) under the skin in the chest or abdomen. Your caregiver may prescribe an ICD if:  You have had an irregular heart rhythm (arrhythmia) that originated in the lower chambers of the heart (ventricles).  Your heart has been damaged by a disease (such as coronary artery disease) or heart condition (such as a heart attack). An ICD consists of a battery that lasts several years, a small computer called a pulse generator, and wires called leads that go into the heart. It is used to detect and correct two dangerous arrhythmias: a rapid heart rhythm (tachycardia) and an arrhythmia in which the ventricles contract in an uncoordinated way (fibrillation). When an ICD detects tachycardia, it sends an electrical signal to the heart that restores the heartbeat to normal (cardioversion). This signal is usually  painless. If cardioversion does not work or if the ICD detects fibrillation, it delivers a small electrical shock to the heart (defibrillation) to restart the heart. The shock may feel like a strong jolt in the chest.ICDs may be programmed to correct other problems. Sometimes, ICDs are programmed to act as another type of implantable device called a pacemaker. Pacemakers are used to treat a slow heartbeat (bradycardia). LET YOUR CAREGIVER KNOW ABOUT:  Any allergies you have.  All medicines you are taking, including vitamins, herbs, eyedrops, and over-the-counter medicines and creams.  Previous problems you or members of your family have had with the use of anesthetics.  Any blood disorders you have had.  Other health problems you have. RISKS AND COMPLICATIONS Generally, the procedure to implant an ICD is safe. However, as with any surgical procedure, complications can occur. Possible complications associated with implanting an ICD include:  Swelling, bleeding, or bruising at the site where the ICD was implanted.  Infection at the site where the ICD was implanted.  A reaction to medicine used during the procedure.  Nerve, heart, or blood vessel damage.  Blood clots. BEFORE THE PROCEDURE  You may need to have blood tests, heart tests, or a chest X-ray done before the day of the procedure.  Ask your caregiver about changing or stopping your regular medicines.  Make plans to have someone drive you home. You may need to stay in the hospital overnight after the procedure.  Stop smoking at least 24 hours before the procedure.  Take a bath or shower the night before the procedure. You may need to scrub your chest or abdomen with a special type of soap.  Do not eat or  drink before your procedure for as long as directed by your caregiver. Ask if it is okay to take any needed medicine with a small sip of water. PROCEDURE  The procedure to implant an ICD in your chest or abdomen is usually  done at a hospital in a room that has a large X-ray machine called a fluoroscope. The machine will be above you during the procedure. It will help your caregiver see your heart during the procedure. Implanting an ICD usually takes 1-3 hours. Before the procedure:   Small monitors will be put on your body. They will be used to check your heart, blood pressure, and oxygen level.  A needle will be put into a vein in your hand or arm. This is called an intravenous (IV) access tube. Fluids and medicine will flow directly into your body through the IV tube.  Your chest or abdomen will be cleaned with a germ-killing (antiseptic) solution. The area may be shaved.  You may be given medicine to help you relax (sedative).  You will be given a medicine called a local anesthetic. This medicine will make the surgical site numb while the ICD is implanted. You will be sleepy but awake during the procedure. After you are numb the procedure will begin. The caregiver will:  Make a small cut (incision). This will make a pocket deep under your skin that will hold the pulse generator.  Guide the leads through a large blood vessel into your heart and attach them to the heart muscles. Depending on the ICD, the leads may go into one ventricle or they may go to both ventricles and into an upper chamber of the heart (atrium).  Test the ICD.  Close the incision with stitches, glue, or staples. AFTER THE PROCEDURE  You may feel pain. Some pain is normal. It may last a few days.  You may stay in a recovery area until the local anesthetic has worn off. Your blood pressure and pulse will be checked often. You will be taken to a room where your heart will be monitored.  A chest X-ray will be taken. This is done to check that the cardioverter defibrillator is in the right place.  You may stay in the hospital overnight.  A slight bump may be seen over the skin where the ICD was placed. Sometimes, it is possible to feel  the ICD under the skin. This is normal.  In the months and years afterward, your caregiver will check the device, the leads, and the battery every few months. Eventually, when the battery is low, the ICD will be replaced. Document Released: 09/29/2001 Document Revised: 10/28/2012 Document Reviewed: 01/27/2012 [facility] Patient Information 2015 [place], [place]. This information is not intended to replace advice given to you by your health care provider. Make sure you discuss any questions you have with your health care provider.

## 2014-05-13 ENCOUNTER — Encounter (HOSPITAL_COMMUNITY): Admission: RE | Payer: Self-pay | Source: Ambulatory Visit

## 2014-05-13 ENCOUNTER — Ambulatory Visit (HOSPITAL_COMMUNITY): Admission: RE | Admit: 2014-05-13 | Payer: Medicaid Other | Source: Ambulatory Visit | Admitting: Internal Medicine

## 2014-05-13 SURGERY — BI-VENTRICULAR IMPLANTABLE CARDIOVERTER DEFIBRILLATOR  (CRT-D)
Anesthesia: LOCAL

## 2014-05-18 ENCOUNTER — Telehealth (HOSPITAL_COMMUNITY): Payer: Self-pay | Admitting: Cardiology

## 2014-05-18 NOTE — Telephone Encounter (Signed)
ABNORMAL LABS Hemoglobin- 7.2 Hematocrit-24.4 RBC-3.08 Per VO Amy Clegg, NP Pt should follow up with GI ASAP Fax copy   Pt aware and voiced understanding Pt states she is a patient at [place] @ [place]   Copy faxed

## 2014-05-23 ENCOUNTER — Ambulatory Visit: Payer: Self-pay

## 2014-05-24 ENCOUNTER — Other Ambulatory Visit: Payer: Self-pay

## 2014-05-24 ENCOUNTER — Telehealth: Payer: Self-pay

## 2014-05-24 ENCOUNTER — Ambulatory Visit (INDEPENDENT_AMBULATORY_CARE_PROVIDER_SITE_OTHER): Payer: Medicaid Other | Admitting: Nurse Practitioner

## 2014-05-24 ENCOUNTER — Encounter: Payer: Self-pay | Admitting: Nurse Practitioner

## 2014-05-24 VITALS — BP 90/54 | HR 78 | Temp 97.6°F | Ht 63.0 in | Wt 123.2 lb

## 2014-05-24 DIAGNOSIS — D649 Anemia, unspecified: Secondary | ICD-10-CM | POA: Diagnosis not present

## 2014-05-24 NOTE — Progress Notes (Signed)
Referring Provider: Boykin Nearing, MD Primary Care Physician:  [name], MD Primary GI: Dr. Oneida Alar  Chief Complaint  Patient presents with  . Follow-up    HPI:   59 year old female presents for follow-up for scattered GI bleed. She has had an extensive workup for her GI bleed including colonoscopy and endoscopy with no definitive source. She is quite anemic and transfusion-dependent. Was evaluated by electrophysiology for Bi-V AICD which was canceled due to her anemia. Last colonoscopy 03/29/2014 which found 8 colon polyps which were subsequently removed, no definitive source for anemia was identified. Multiple polyp fragments on pathology were a mix of tubular adenoma and hyperplastic polyps. Recommend repeat colonoscopy in 3 years. EGD completed 11/22/2013 found gastritis/duodenitis, no source for melena identified. Continued anemia with no definite source identified despite extensive workup, as determined to need a Givens capsule study for obscure GI bleed which is transfusion dependent. This was scheduled, however the patient subsequently admitted to the hospital and procedure was therefore canceled. Last CBC on 05/11/2014 with hemoglobin 7.4 and hematocrit 22.6. Patient baseline hgb between 7.7 and 8.6. Cardiology noted reviewed.  Today she states she has continued fatigue. Denies abdominal pain, N/V, hematochezia, melena. Has never had a blood transfusion. Denies fever, chills, unintentional weight loss. Has occasional chest pain which she states is baseline for her. Also has regular palpitations, which is also baseline for her. Sees cardiology for heart failure and is on standard medications for CHF with a subsequent soft BP here today. Denies any other upper or lower GI symptoms.   Past Medical History  Diagnosis Date  . Diverticulosis   . Pancreatitis 1980, 2015    in the setting of ETOH  . chronic systolic heart failure     a. ECHO (05/08/13): EF 20-25%, diff HK with  akinesis of basal inferior wall, grade 1 DD, trivial MR, trivial TR b) RHC (05/06/13): RA 7, RV 30/2, PA 31/19 (24), PCWP 10, Fick CO/CI: 2.9/1.74, Thermo CO/Ci: 2.4/1.4, PA sat 53%  . Ischemic cardiomyopathy   . CAD (coronary artery disease)     a. LHC (04/2013): Chronically occluded LAD, moderate dz in large OM1 and severe dz and small posterior lateral vessel    . Persistent atrial fibrillation 2015  . Suicidal overdose 10/03/13    Overdoes on Beta Blockers & Xarelto [facility] ED)  . Anxiety 2015   . Psoriasis 1968  . GERD (gastroesophageal reflux disease) 2015   . Hyperlipidemia 2015  . Hypertension 2015  . ETOH abuse 1981    started abusing alcohol at age 18, quit   . Stroke 2011    left side weakness, minimal  . Hypothyroidism   . PONV (postoperative nausea and vomiting)     Past Surgical History  Procedure Laterality Date  . Colostomy takedown  2007  . Exploratory laparotomy with colon resection, colostomy  2007  . Esophagogastroduodenoscopy (egd) with propofol N/A 11/22/2013    Dr. Oneida Alar: gastritis and duodenitis, negative H.pylori  . Esophageal biopsy N/A 11/22/2013    Procedure: GASTRIC BIOPSIES;  Surgeon: Danie Binder, MD;  Location: [facility];  Service: Endoscopy;  Laterality: N/A;  . Left and right heart catheterization with coronary angiogram N/A 05/10/2013    Procedure: LEFT AND RIGHT HEART CATHETERIZATION WITH CORONARY ANGIOGRAM;  Surgeon: [name], MD;  Location: [facility] CATH LAB;  Service: Cardiovascular;  Laterality: N/A;  . Left heart catheterization with coronary angiogram N/A 09/03/2013    Procedure: LEFT HEART CATHETERIZATION WITH CORONARY ANGIOGRAM;  Surgeon: Sinclair Grooms, MD;  Location: [facility] CATH LAB;  Service: Cardiovascular;  Laterality: N/A;  . Colonoscopy with propofol N/A 03/29/2014    SLF: 1. No definite source for anemia identified 2. 8 colon polyps removed 3. Severe diverticulosis noted the transverse colon  and right colon 4. Small internal hemorrohids.   .  Polypectomy N/A 03/29/2014    Procedure: POLYPECTOMY;  Surgeon: Danie Binder, MD;  Location: [facility];  Service: Endoscopy;  Laterality: N/A;  ascending colon, sigmoid colon x4, rectal x3  . Left and right heart catheterization with coronary angiogram N/A 04/14/2014    Procedure: LEFT AND RIGHT HEART CATHETERIZATION WITH CORONARY ANGIOGRAM;  Surgeon: Larey Dresser, MD;  Location: [facility] CATH LAB;  Service: Cardiovascular;  Laterality: N/A;    Current Outpatient Prescriptions  Medication Sig Dispense Refill  . acetaminophen (TYLENOL) 325 MG tablet Take 650 mg by mouth every 6 (six) hours as needed for fever or headache.    Marland Kitchen apixaban (ELIQUIS) 5 MG TABS tablet Take 1 tablet (5 mg total) by mouth 2 (two) times daily. 60 tablet 3  . atorvastatin (LIPITOR) 80 MG tablet Take 1 tablet (80 mg total) by mouth daily. 30 tablet 6  . carvedilol (COREG) 6.25 MG tablet Take 1 tablet (6.25 mg total) by mouth 2 (two) times daily with a meal. Hold for heart rate less than 60 60 tablet 3  . furosemide (LASIX) 20 MG tablet Take 1 tablet (20 mg total) by mouth every other day. 15 tablet 6  . levothyroxine (SYNTHROID, LEVOTHROID) 25 MCG tablet Take 0.125 mcg by mouth daily before breakfast.    . lisinopril (PRINIVIL,ZESTRIL) 5 MG tablet Take 1 tablet (5 mg total) by mouth daily. 90 tablet 3  . LORazepam (ATIVAN) 0.5 MG tablet Take 1 tablet (0.5 mg total) by mouth 2 (two) times daily. 60 tablet 2  . nitroGLYCERIN (NITROSTAT) 0.3 MG SL tablet Place 0.3 mg under the tongue every 5 (five) minutes as needed for chest pain (MAX 3 TABLETS).     . pantoprazole (PROTONIX) 40 MG tablet TAKE 1 TABLET BY MOUTH TWICE DAILY BEFORE A MEAL. 60 tablet 5  . sertraline (ZOLOFT) 50 MG tablet Take 1 tablet (50 mg total) by mouth daily. 90 tablet 1  . spironolactone (ALDACTONE) 25 MG tablet Take 0.5 tablets (12.5 mg total) by mouth daily. 15 tablet 3   No current facility-administered medications for this visit.    Allergies as of  05/24/2014 - Review Complete 05/24/2014  Allergen Reaction Noted  . Morphine and related Other (See Comments) 09/24/2013  . Penicillins Anaphylaxis and Rash 05/06/2013    Family History  Problem Relation Age of Onset  . CAD Father 53    MI  . CAD Mother 31    MI  . Alcohol abuse Mother   . CAD Sister 87    Stent  . Diabetes Sister   . CAD Brother 1    MI  . Cancer Neg Hx   . Colon cancer Mother     in her 76s    History   Social History  . Marital Status: Single    Spouse Name: N/A  . Number of Children: 2  . Years of Education: 10   Occupational History  . Unemployed     Social History Main Topics  . Smoking status: Current Some Day Smoker -- 0.25 packs/day for 47 years    Types: Cigarettes  . Smokeless tobacco: Never Used  Comment: she smokes occasionally, trying to quit  . Alcohol Use: 0.0 oz/week    0 Standard drinks or equivalent per week     Comment: last drink in 02/4823, former alcoholic  . Drug Use: No  . Sexual Activity: No   Other Topics Concern  . None   Social History Narrative   Lived previously with her spouse in an [place].     Son and daughter   Daughter in [place]   Son in [place]   Two grandchildren.       Presently in a group home [facility], moved in 10/13/13.           Review of Systems: General: Negative for anorexia, weight loss, fever, chills, fatigue, weakness. Eyes: Negative for vision changes.  ENT: Negative for hoarseness, difficulty swallowing. CV: Admits baseline chest pain intermittently, palpitations. Admits occasional dyspnea on exertion. Denies peripheral edema.  Respiratory: Admits occasional dyspnea at rest. Denies cough, sputum, wheezing.  GI: See history of present illness. Derm: Negative for rash or itching.  Neuro: Negative for weakness, memory loss, confusion.  Psych: Negative for anxiety, depression.  Endo: Negative for unusual weight change.  Heme: Negative for bruising or bleeding. Allergy:  Negative for rash or hives.   Physical Exam: BP 90/54 mmHg  Pulse 78  Temp(Src) 97.6 F (36.4 C) (Oral)  Ht 5\' 3"  (1.6 m)  Wt 123 lb 3.2 oz (55.883 kg)  BMI 21.83 kg/m2  LMP 01/22/1988 (Approximate) General:   Alert and oriented. No distress noted. Pleasant and cooperative.  Head:  Normocephalic and atraumatic. Eyes:  Conjuctiva clear without scleral icterus. Mouth:  Oral mucosa pink and moist. Good dentition. No lesions. Neck:  Supple, without mass or thyromegaly. Lungs:  Clear to auscultation bilaterally. No wheezes, rales, or rhonchi. No distress.  Heart:  S1, S2 present without murmurs, rubs, or gallops. Regular rate and rhythm. Abdomen:  +BS, soft, non-tender and non-distended. No rebound or guarding. No HSM or masses noted. Msk:  Symmetrical without gross deformities. Normal posture. Pulses:  2+ DP noted bilaterally Extremities:  Without edema. Neurologic:  Alert and  oriented x4;  grossly normal neurologically. Skin:  Intact without significant lesions or rashes. Cervical Nodes:  No significant cervical adenopathy. Psych:  Alert and cooperative. Normal mood and affect.    05/24/2014 1:35 PM

## 2014-05-24 NOTE — Patient Instructions (Signed)
1. We will schedule you for your pill procedures. 2. The first one will be a small capsule to ensure that the larger camera capsule will pass with no problem.  3. Return in 3 months for routine follow-up. 4. I will also check her blood counts today.

## 2014-05-24 NOTE — Telephone Encounter (Signed)
Per [employer]/Medicaid [place]- No pre-cert is required for Agile or Givens Capsule.   Called pt and LM for her to call office back to set up date of procedure.

## 2014-05-24 NOTE — Assessment & Plan Note (Signed)
59 year old female with persistent anemia. Has persistent weakness and fatigue. Also has a history of CHF, was evaluated by electrophysiology for bi-V AICD placement, however this had to be canceled due to her anemia which had a hemoglobin of 7.9. She has been extensively worked up by our office including endoscopy and colonoscopy with no definite source of bleeding identified. Last piece of complete GI evaluation would be a Givens capsule. We attempted to do this previously, however the patient was admitted to the hospital and was unable to complete. Given that she has had a partial colectomy with ostomy and subsequent takedown, we will plan for an agile capsule to ensure clear passage before the given study. Return for routine follow-up in 3 months. I will also check her CBC today.

## 2014-06-02 ENCOUNTER — Encounter (HOSPITAL_COMMUNITY)
Admission: RE | Disposition: A | Discharge status: Home / Self Care | Payer: Self-pay | Source: Ambulatory Visit | Attending: Gastroenterology

## 2014-06-02 ENCOUNTER — Ambulatory Visit: Payer: Self-pay | Admitting: Gastroenterology

## 2014-06-02 ENCOUNTER — Ambulatory Visit (HOSPITAL_COMMUNITY)
Admission: RE | Admit: 2014-06-02 | Discharge: 2014-06-02 | Disposition: A | Discharge status: Home / Self Care | Payer: Medicaid Other | Source: Ambulatory Visit | Attending: Gastroenterology | Admitting: Gastroenterology

## 2014-06-02 DIAGNOSIS — D509 Iron deficiency anemia, unspecified: Secondary | ICD-10-CM | POA: Diagnosis present

## 2014-06-02 HISTORY — PX: AGILE CAPSULE: SHX5420

## 2014-06-02 SURGERY — AGILE CAPSULE

## 2014-06-02 NOTE — Progress Notes (Signed)
CC'ED TO PCP 

## 2014-06-03 ENCOUNTER — Other Ambulatory Visit: Payer: Self-pay

## 2014-06-03 ENCOUNTER — Ambulatory Visit (HOSPITAL_COMMUNITY): Payer: Medicaid Other | Attending: Gastroenterology

## 2014-06-03 ENCOUNTER — Encounter (HOSPITAL_COMMUNITY): Payer: Self-pay | Admitting: Gastroenterology

## 2014-06-03 DIAGNOSIS — K922 Gastrointestinal hemorrhage, unspecified: Secondary | ICD-10-CM

## 2014-06-03 DIAGNOSIS — D649 Anemia, unspecified: Secondary | ICD-10-CM

## 2014-06-03 DIAGNOSIS — D509 Iron deficiency anemia, unspecified: Secondary | ICD-10-CM | POA: Diagnosis present

## 2014-06-03 NOTE — Progress Notes (Signed)
PLEASE CALL PT. HER AGILE CAPSULE PASSED INTO HER LEFT COLON. SHE IS A CANDIDATE FOR A GIVENS CAPSULE. SCHEDULE GIVENS CAPSULE STUDY FOR NEXT WEEK.

## 2014-06-10 ENCOUNTER — Ambulatory Visit (HOSPITAL_COMMUNITY)
Admission: RE | Admit: 2014-06-10 | Discharge: 2014-06-10 | Disposition: A | Discharge status: Home / Self Care | Payer: Medicaid Other | Source: Ambulatory Visit | Attending: Gastroenterology | Admitting: Gastroenterology

## 2014-06-10 ENCOUNTER — Encounter (HOSPITAL_COMMUNITY)
Admission: RE | Disposition: A | Discharge status: Home / Self Care | Payer: Self-pay | Source: Ambulatory Visit | Attending: Gastroenterology

## 2014-06-10 DIAGNOSIS — K2980 Duodenitis without bleeding: Secondary | ICD-10-CM | POA: Diagnosis not present

## 2014-06-10 DIAGNOSIS — D500 Iron deficiency anemia secondary to blood loss (chronic): Secondary | ICD-10-CM | POA: Insufficient documentation

## 2014-06-10 DIAGNOSIS — K2970 Gastritis, unspecified, without bleeding: Secondary | ICD-10-CM | POA: Insufficient documentation

## 2014-06-10 HISTORY — PX: GIVENS CAPSULE STUDY: SHX5432

## 2014-06-10 SURGERY — IMAGING PROCEDURE, GI TRACT, INTRALUMINAL, VIA CAPSULE

## 2014-06-13 ENCOUNTER — Telehealth: Payer: Self-pay | Admitting: Gastroenterology

## 2014-06-13 ENCOUNTER — Other Ambulatory Visit: Payer: Self-pay

## 2014-06-13 DIAGNOSIS — D520 Dietary folate deficiency anemia: Secondary | ICD-10-CM

## 2014-06-13 DIAGNOSIS — D508 Other iron deficiency anemias: Secondary | ICD-10-CM

## 2014-06-13 NOTE — Procedures (Signed)
  PATIENT DATA: GASTRIC PASSAGE TIME: 24 m, SB PASSAGE TIME: 1H 91m  RESULTS: LIMITED views of gastric mucosa due to retained contents. STREAKS OF OLD blood in the stomach ASSOCIATED WITH GASTRITIS. NO FRESH BLOOD IN STOMACH. RARE SMALL BOWEL ULCER. No masses, or AVMs SEEN. LIMITED VIEWS OF THE COLON DUE TO RETAINED CONTENTS. NO OLD BLOOD OR FRESH BLOOD SEEN IN SMALL BOWEL OR COLON.   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  Plan: 1. HEMATOLOGY CONSULT FOR TRANSFUSION ANEMIA ON XARELTO AND ELIQUIS 2. AVOID ASA/NSAIDS. 3. BID PPI 4. OPV IN 4 MOS

## 2014-06-13 NOTE — Telephone Encounter (Signed)
Referral has been made.

## 2014-06-13 NOTE — Telephone Encounter (Signed)
PLEASE CALL PT. SHE HAD A NORMAL BLOOD COUNT UNTIL SHE STARTED ON XARELTO AND ELIQUIS. SINCE STARTING MEDICATION HER BLOOD COUNT HAS BEEN 8-10. HER GIVENS CAPSULE SHOWS GASTRITIS/DUODENITIS. THESE WILL CAUSE A CHRONIC SOURCE OF BLOOD LOSS. SHE NEEDS TO HAVE HER BLOOD COUNT CLOSELY MONITORED IF SHE NEEDS THESE MEDICATIONS. NO ADDITIONAL GI WORKUP IS NEEDED AT THIS TIME.   Plan: 1. SHE SHOULD SEE A BLOOD SPECIALIST TO SEE IF THERE IS ANYTHING ELSE WE NEED TO DO TO KEEP HER BLOOD COUNT UP-HEMATOLOGY CONSULT FOR TRANSFUSION ANEMIA ON XARELTO AND ELIQUIS 2. SHE SHOULD AVOID ASA/NSAIDS. 3. CONTINUE PROTONIX BID. 4. OPV IN 4 MOS E30 TRANSFUSION DEPENDENT ANEMIA

## 2014-06-14 ENCOUNTER — Encounter (HOSPITAL_COMMUNITY): Payer: Self-pay | Admitting: Gastroenterology

## 2014-06-14 NOTE — Telephone Encounter (Signed)
I called and informed [name] at the [facility]. She is aware referral made to Hematology. Mailing this list of info for their records at the home.

## 2014-06-15 ENCOUNTER — Telehealth: Payer: Self-pay | Admitting: Family Medicine

## 2014-06-15 NOTE — Telephone Encounter (Signed)
Order written and placed in to be faxed

## 2014-06-15 NOTE — Telephone Encounter (Signed)
Facility called to request a D/C order for the patient's Ensure pudding because the patient is not taking it anymore, facility needs the order as soon as possible. Please fax to: [phone].

## 2014-06-16 ENCOUNTER — Encounter (HOSPITAL_COMMUNITY): Payer: Self-pay | Admitting: Oncology

## 2014-06-16 ENCOUNTER — Telehealth (HOSPITAL_COMMUNITY): Payer: Self-pay

## 2014-06-16 ENCOUNTER — Encounter (HOSPITAL_COMMUNITY): Payer: Medicaid Other | Attending: Oncology | Admitting: Oncology

## 2014-06-16 VITALS — BP 84/45 | HR 59 | Temp 98.7°F | Resp 16 | Ht 62.5 in | Wt 118.5 lb

## 2014-06-16 DIAGNOSIS — I2510 Atherosclerotic heart disease of native coronary artery without angina pectoris: Secondary | ICD-10-CM

## 2014-06-16 DIAGNOSIS — Z0000 Encounter for general adult medical examination without abnormal findings: Secondary | ICD-10-CM

## 2014-06-16 DIAGNOSIS — I5189 Other ill-defined heart diseases: Secondary | ICD-10-CM | POA: Diagnosis not present

## 2014-06-16 DIAGNOSIS — D649 Anemia, unspecified: Secondary | ICD-10-CM | POA: Diagnosis present

## 2014-06-16 DIAGNOSIS — I509 Heart failure, unspecified: Secondary | ICD-10-CM | POA: Diagnosis not present

## 2014-06-16 LAB — COMPREHENSIVE METABOLIC PANEL
ALBUMIN: 3.8 g/dL (ref 3.5–5.0)
ALK PHOS: 76 U/L (ref 38–126)
ALT: 23 U/L (ref 14–54)
AST: 24 U/L (ref 15–41)
Anion gap: 10 (ref 5–15)
BUN: 25 mg/dL — ABNORMAL HIGH (ref 6–20)
CHLORIDE: 103 mmol/L (ref 101–111)
CO2: 26 mmol/L (ref 22–32)
Calcium: 8.9 mg/dL (ref 8.9–10.3)
Creatinine, Ser: 1.36 mg/dL — ABNORMAL HIGH (ref 0.44–1.00)
GFR calc Af Amer: 48 mL/min — ABNORMAL LOW (ref >60)
GFR, EST NON AFRICAN AMERICAN: 42 mL/min — AB (ref >60)
Glucose, Bld: 96 mg/dL (ref 65–99)
POTASSIUM: 4.2 mmol/L (ref 3.5–5.1)
SODIUM: 139 mmol/L (ref 135–145)
Total Bilirubin: 0.2 mg/dL — ABNORMAL LOW (ref 0.3–1.2)
Total Protein: 7.5 g/dL (ref 6.5–8.1)

## 2014-06-16 LAB — RETICULOCYTES
RBC.: 3 MIL/uL — AB (ref 3.87–5.11)
RETIC CT PCT: 1 % (ref 0.4–3.1)
Retic Count, Absolute: 30 10*3/uL (ref 19.0–186.0)

## 2014-06-16 LAB — CBC WITH DIFFERENTIAL/PLATELET
BASOS PCT: 1 % (ref 0–1)
Basophils Absolute: 0.1 10*3/uL (ref 0.0–0.1)
Eosinophils Absolute: 0.2 10*3/uL (ref 0.0–0.7)
Eosinophils Relative: 3 % (ref 0–5)
HCT: 22.7 % — ABNORMAL LOW (ref 36.0–46.0)
HEMOGLOBIN: 6.8 g/dL — AB (ref 12.0–15.0)
Lymphocytes Relative: 25 % (ref 12–46)
Lymphs Abs: 1.4 10*3/uL (ref 0.7–4.0)
MCH: 22.7 pg — AB (ref 26.0–34.0)
MCHC: 30 g/dL (ref 30.0–36.0)
MCV: 75.7 fL — ABNORMAL LOW (ref 78.0–100.0)
Monocytes Absolute: 0.7 10*3/uL (ref 0.1–1.0)
Monocytes Relative: 12 % (ref 3–12)
NEUTROS ABS: 3.2 10*3/uL (ref 1.7–7.7)
Neutrophils Relative %: 59 % (ref 43–77)
PLATELETS: 343 10*3/uL (ref 150–400)
RBC: 3 MIL/uL — AB (ref 3.87–5.11)
RDW: 14.7 % (ref 11.5–15.5)
WBC: 5.5 10*3/uL (ref 4.0–10.5)

## 2014-06-16 LAB — C-REACTIVE PROTEIN: CRP: <0.5 mg/dL (ref <1.0)

## 2014-06-16 LAB — SEDIMENTATION RATE: Sed Rate: 50 mm/hr — ABNORMAL HIGH (ref 0–22)

## 2014-06-16 LAB — FOLATE: Folate: 20.7 ng/mL (ref >5.9)

## 2014-06-16 LAB — IRON AND TIBC
IRON: 15 ug/dL — AB (ref 28–170)
Saturation Ratios: 4 % — ABNORMAL LOW (ref 10.4–31.8)
TIBC: 410 ug/dL (ref 250–450)
UIBC: 395 ug/dL

## 2014-06-16 LAB — VITAMIN B12: VITAMIN B 12: 237 pg/mL (ref 180–914)

## 2014-06-16 LAB — LACTATE DEHYDROGENASE: LDH: 131 U/L (ref 98–192)

## 2014-06-16 LAB — FERRITIN: Ferritin: 5 ng/mL — ABNORMAL LOW (ref 11–307)

## 2014-06-16 NOTE — Progress Notes (Signed)
[name] presented for labwork. Labs per MD order drawn via Peripheral Line 21 gauge needle inserted in right AC  Good blood return present. Procedure without incident.  Needle removed intact. Patient tolerated procedure well.

## 2014-06-16 NOTE — Patient Instructions (Addendum)
[place] at [facility] Discharge Instructions  RECOMMENDATIONS MADE BY THE CONSULTANT AND ANY TEST RESULTS WILL BE SENT TO YOUR REFERRING PHYSICIAN.  Exam and discussion by Robynn Pane, PA-C and Dr. Ancil Linsey. We will give you an iron infusion next week. Labs in 4 weeks prior to your appointment. Will see you back in 4 weeks to discuss test results.   Thank you for choosing [place] at [facility] to provide your oncology and hematology care.  To afford each patient quality time with our provider, please arrive at least 15 minutes before your scheduled appointment time.    You need to re-schedule your appointment should you arrive 10 or more minutes late.  We strive to give you quality time with our providers, and arriving late affects you and other patients whose appointments are after yours.  Also, if you no show three or more times for appointments you may be dismissed from the clinic at the providers discretion.     Again, thank you for choosing [facility].  Our hope is that these requests will decrease the amount of time that you wait before being seen by our physicians.       _____________________________________________________________  Should you have questions after your visit to [facility], please contact our office at [phone] between the hours of 8:30 a.m. and 4:30 p.m.  Voicemails left after 4:30 p.m. will not be returned until the following business day.  For prescription refill requests, have your pharmacy contact our office.    Iron Deficiency Anemia Anemia is when you have a low number of healthy red blood cells. It is often caused by too little iron. This is called iron deficiency anemia. It may make you tired and short of breath. HOME CARE   Take iron as told by your doctor.  Take vitamins as told by your doctor.  Eat foods that have iron in them. This includes liver, lean beef,  whole-grain bread, eggs, dried fruit, and dark green leafy vegetables. GET HELP RIGHT AWAY IF:  You pass out (faint).  You have chest pain.  You feel sick to your stomach (nauseous) or throw up (vomit).  You get very short of breath with activity.  You are weak.  You have a fast heartbeat.  You start to sweat for no reason.  You become light-headed when getting up from a chair or bed. MAKE SURE YOU:  Understand these instructions.  Will watch your condition.  Will get help right away if you are not doing well or get worse. Document Released: 02/09/2010 Document Revised: 01/12/2013 Document Reviewed: 09/14/2012 [facility] Patient Information 2015 [place], [place]. This information is not intended to replace advice given to you by your health care provider. Make sure you discuss any questions you have with your health care provider.

## 2014-06-16 NOTE — Telephone Encounter (Signed)
Spoke with care provider at [facility].  Notified that [name] hemoglobin is 6.8 grams and that she needs to be transfused.  To come to the clinic in the morning to be typed and cross matched and possibly transfused.  Instructed that if she become extremely dyspneic or complains with any chest discomfort, she should be brought to the ED for evaluation.  Verbalized understanding of instructions.

## 2014-06-16 NOTE — Progress Notes (Addendum)
[facility] Hematology/Oncology Consultation   Name: [name]      MRN: [id number]    Location: Room/bed info not found  Date: 06/16/2014 Time:5:00 PM   REFERRING PHYSICIAN:  Barney Drain, MD  REASON FOR CONSULT:  Anemia   DIAGNOSIS:  Microcytic, normochromic anemia  HISTORY OF PRESENT ILLNESS:   [name] is a 59 year old white American woman with a past medical history significant for PAF, HTN, GERD, CKD Stage 3, CHF, cardiomyopathy, partial colectomy with reversal for diverticulitis by Dr. Georgette Dover, and CAD who presented to the [facility] for anemia dating back to August 2015 with a negative colonoscopy on 03/29/2014 and an EGD on 11/22/2013 positive for gastritis/duodenitis and on Eliquis anticoagulation for A-fib.  I personally reviewed and went over laboratory results with the patient.  The results are noted within this dictation.  Anemia has been present since August 2015, but has only recently became microcytic. Labs otherwise demonstrate a chronic renal disease.  I personally reviewed and went over radiographic studies with the patient.  The results are noted within this dictation.  She has never had a mammogram.  She is camera capsule study which was negative.  She reports that her appetite has been decreased with 8 lb weight loss x 2 months.  She admits to nausea with vomiting.  She denies pica or ice craving.  She otherwise denies any complaints. She denies any blood in her stool and black tarry stool.  She is currently on Eliquis as her anticoagulation for A-fib.  She reports that she is waiting for her Hgb to improve before being approved for pacemaker placement.  "Can you get me fixed-up so I can get a pacemaker?"  PAST MEDICAL HISTORY:   Past Medical History  Diagnosis Date  . Diverticulosis   . Pancreatitis 1980, 2015    in the setting of ETOH  . chronic systolic heart failure     a. ECHO (05/08/13): EF 20-25%, diff HK with akinesis of basal inferior wall,  grade 1 DD, trivial MR, trivial TR b) RHC (05/06/13): RA 7, RV 30/2, PA 31/19 (24), PCWP 10, Fick CO/CI: 2.9/1.74, Thermo CO/Ci: 2.4/1.4, PA sat 53%  . Ischemic cardiomyopathy   . CAD (coronary artery disease)     a. LHC (04/2013): Chronically occluded LAD, moderate dz in large OM1 and severe dz and small posterior lateral vessel    . Persistent atrial fibrillation 2015  . Suicidal overdose 10/03/13    Overdoes on Beta Blockers & Xarelto [facility])  . Anxiety 2015   . Psoriasis 1968  . GERD (gastroesophageal reflux disease) 2015   . Hyperlipidemia 2015  . Hypertension 2015  . ETOH abuse 1981    started abusing alcohol at age 55, quit   . Stroke 2011    left side weakness, minimal  . Hypothyroidism   . PONV (postoperative nausea and vomiting)     ALLERGIES: Allergies  Allergen Reactions  . Morphine And Related Other (See Comments)    Dizziness and hallucinations  . Penicillins Anaphylaxis and Rash      MEDICATIONS: I have reviewed the patient's current medications.    Current Outpatient Prescriptions on File Prior to Visit  Medication Sig Dispense Refill  . acetaminophen (TYLENOL) 325 MG tablet Take 650 mg by mouth every 6 (six) hours as needed for fever or headache.    Marland Kitchen apixaban (ELIQUIS) 5 MG TABS tablet Take 1 tablet (5 mg total) by mouth 2 (two) times  daily. 60 tablet 3  . atorvastatin (LIPITOR) 80 MG tablet Take 1 tablet (80 mg total) by mouth daily. 30 tablet 6  . carvedilol (COREG) 6.25 MG tablet Take 1 tablet (6.25 mg total) by mouth 2 (two) times daily with a meal. Hold for heart rate less than 60 60 tablet 3  . furosemide (LASIX) 20 MG tablet Take 1 tablet (20 mg total) by mouth every other day. 15 tablet 6  . levothyroxine (SYNTHROID, LEVOTHROID) 25 MCG tablet Take 0.125 mcg by mouth daily before breakfast.    . lisinopril (PRINIVIL,ZESTRIL) 5 MG tablet Take 1 tablet (5 mg total) by mouth daily. 90 tablet 3  . LORazepam (ATIVAN) 0.5 MG tablet Take 1 tablet (0.5 mg total)  by mouth 2 (two) times daily. 60 tablet 2  . nitroGLYCERIN (NITROSTAT) 0.3 MG SL tablet Place 0.3 mg under the tongue every 5 (five) minutes as needed for chest pain (MAX 3 TABLETS).     . pantoprazole (PROTONIX) 40 MG tablet TAKE 1 TABLET BY MOUTH TWICE DAILY BEFORE A MEAL. 60 tablet 5  . sertraline (ZOLOFT) 50 MG tablet Take 1 tablet (50 mg total) by mouth daily. 90 tablet 1  . spironolactone (ALDACTONE) 25 MG tablet Take 0.5 tablets (12.5 mg total) by mouth daily. 15 tablet 3   No current facility-administered medications on file prior to visit.     PAST SURGICAL HISTORY Past Surgical History  Procedure Laterality Date  . Colostomy takedown  2007  . Exploratory laparotomy with colon resection, colostomy  2007  . Esophagogastroduodenoscopy (egd) with propofol N/A 11/22/2013    Dr. Oneida Alar: gastritis and duodenitis, negative H.pylori  . Esophageal biopsy N/A 11/22/2013    Procedure: GASTRIC BIOPSIES;  Surgeon: Danie Binder, MD;  Location: [facility];  Service: Endoscopy;  Laterality: N/A;  . Left and right heart catheterization with coronary angiogram N/A 05/10/2013    Procedure: LEFT AND RIGHT HEART CATHETERIZATION WITH CORONARY ANGIOGRAM;  Surgeon: [name], MD;  Location: [facility] CATH LAB;  Service: Cardiovascular;  Laterality: N/A;  . Left heart catheterization with coronary angiogram N/A 09/03/2013    Procedure: LEFT HEART CATHETERIZATION WITH CORONARY ANGIOGRAM;  Surgeon: Sinclair Grooms, MD;  Location: [facility] CATH LAB;  Service: Cardiovascular;  Laterality: N/A;  . Colonoscopy with propofol N/A 03/29/2014    SLF: 1. No definite source for anemia identified 2. 8 colon polyps removed 3. Severe diverticulosis noted the transverse colon  and right colon 4. Small internal hemorrohids.   . Polypectomy N/A 03/29/2014    Procedure: POLYPECTOMY;  Surgeon: Danie Binder, MD;  Location: [facility];  Service: Endoscopy;  Laterality: N/A;  ascending colon, sigmoid colon x4, rectal x3  . Left and right heart  catheterization with coronary angiogram N/A 04/14/2014    Procedure: LEFT AND RIGHT HEART CATHETERIZATION WITH CORONARY ANGIOGRAM;  Surgeon: Larey Dresser, MD;  Location: [facility] CATH LAB;  Service: Cardiovascular;  Laterality: N/A;  . Agile capsule N/A 06/02/2014    Procedure: AGILE CAPSULE;  Surgeon: Danie Binder, MD;  Location: [facility] ENDO [facility];  Service: Endoscopy;  Laterality: N/A;  0800  . Givens capsule study N/A 06/10/2014    Procedure: GIVENS CAPSULE STUDY;  Surgeon: Danie Binder, MD;  Location: [facility];  Service: Endoscopy;  Laterality: N/A;  0700    FAMILY HISTORY: Family History  Problem Relation Age of Onset  . CAD Father 66    MI  . CAD Mother 55    MI  .  Alcohol abuse Mother   . CAD Sister 2    Stent  . Diabetes Sister   . CAD Brother 66    MI  . Cancer Neg Hx   . Colon cancer Mother     in her 45s    SOCIAL HISTORY:  reports that she has been smoking Cigarettes.  She has a 11.75 pack-year smoking history. She has never used smokeless tobacco. She reports that she drinks alcohol. She reports that she does not use illicit drugs.  PERFORMANCE STATUS: The patient's performance status is 0 - Asymptomatic  PHYSICAL EXAM: Most Recent Vital Signs: Blood pressure 84/45, pulse 59, temperature 98.7 F (37.1 C), temperature source Oral, resp. rate 16, height 5' 2.5" (1.588 m), weight 118 lb 8 oz (53.751 kg), last menstrual period 01/22/1988, SpO2 100 %. General appearance: alert, cooperative, appears stated age and no distress Head: Normocephalic, without obvious abnormality, atraumatic Eyes: negative findings: lids and lashes normal, conjunctivae and sclerae normal, corneas clear and pupils equal, round, reactive to light and accomodation Throat: normal findings: lips normal without lesions, buccal mucosa normal, gums healthy, palate normal and tongue midline and normal and abnormal findings: upper edentulous Neck: no adenopathy, supple, symmetrical, trachea midline and  thyroid not enlarged, symmetric, no tenderness/mass/nodules Lungs: clear to auscultation bilaterally and normal percussion bilaterally Heart: regular rate and rhythm, S1, S2 normal, no murmur, click, rub or gallop Abdomen: soft, non-tender; bowel sounds normal; no masses,  no organomegaly Extremities: extremities normal, atraumatic, no cyanosis or edema Skin: Skin color, texture, turgor normal. No rashes or lesions Lymph nodes: Cervical, supraclavicular, and axillary nodes normal. Neurologic: Alert and oriented X 3, normal strength and tone. Normal symmetric reflexes. Normal coordination and gait  LABORATORY DATA:  CBC    Component Value Date/Time   WBC 4.4 05/11/2014 1047   RBC 3.01* 05/11/2014 1047   HGB 7.4 Repeated and verified X2.* 05/11/2014 1047   HCT 22.6 Repeated and verified X2.* 05/11/2014 1047   PLT 264.0 05/11/2014 1047   MCV 75.1* 05/11/2014 1047   MCH 25.9* 04/14/2014 1135   MCHC 32.9 05/11/2014 1047   RDW 14.3 05/11/2014 1047   LYMPHSABS 1.2 05/11/2014 1047   MONOABS 0.5 05/11/2014 1047   EOSABS 0.1 05/11/2014 1047   BASOSABS 0.0 05/11/2014 1047    Lab Results  Component Value Date   FERRITIN 19 03/29/2014    ASSESSMENT:  1. Microcytic, normochromic anemia with anemia dating back to August 2015. Colonscopy on 03/29/2014 is negative, EGD on 11/22/2013 demonstrates duodenitis and gastritis. 2. Chronic systolic dysfunction/ ischemic CM with EF of 25% 3. NYHA Class II/III CHF 4. CAD 5. Atrial fibrillation (chads2vasc score is at least 6). Anticoagulated with eliquis.  6. CKD Stage 3 7. Partial colectomy with reversal for diverticulitis by Dr. Georgette Dover 8. Decreased appetite with weight loss. 9. History of suicide with last attempt 09/2013   PLAN:  1. I personally reviewed and went over laboratory results with the patient.  The results are noted within this dictation. 2. I personally reviewed and went over radiographic studies with the patient.  The results are  noted within this dictation.   3. Chart reviewed 4. Mammogram. 5. Labs today: CBC diff, CMET, Anemia Panel, Retic Count, Haptoglobin, EPO Level, CRP, ESR, MM panel, B2M, LDH 6. IV Feraheme 510 mg x 1. 7. Labs in 4 weeks: CBC diff, iron/TIBC, ferritin. 8. Return in 4 weeks for follow-up.  All questions were answered. The patient knows to call the clinic with any problems,  questions or concerns. We can certainly see the patient much sooner if necessary.  Patient and plan discussed with Dr. Ancil Linsey and she is in agreement with the aforementioned.   This note is electronically signed by: Robynn Pane, PA-C 06/16/2014 5:00 PM    ADDENDUM: Hgb came back today at 6.9 g/dL.  Will set-up for transfusion tomorrow.  Patient seen and examined. Hgb reported as above.Suprisingly asymptomatic and looks fairly good. Have arranged for transfusion in am. Advised to go to ED for symptoms such as SOB, CP or dizziness.  Will check additional peripheral studies to rule out additional contributing causes to her anemia in addition to iron deficiency and CKD.  Has had thorough GI evaluation. Continues on Eliquis.   Plan as above. Advised patient I do not feel need for BMBX currently.  Will see what counts do with iron replacement and address other issues labs pending. Donald Pore MD

## 2014-06-17 ENCOUNTER — Encounter (HOSPITAL_BASED_OUTPATIENT_CLINIC_OR_DEPARTMENT_OTHER): Payer: Medicaid Other

## 2014-06-17 ENCOUNTER — Encounter (HOSPITAL_COMMUNITY): Payer: Self-pay

## 2014-06-17 ENCOUNTER — Other Ambulatory Visit (HOSPITAL_COMMUNITY): Payer: Self-pay | Admitting: Oncology

## 2014-06-17 ENCOUNTER — Encounter: Payer: Self-pay | Admitting: Dietician

## 2014-06-17 DIAGNOSIS — D649 Anemia, unspecified: Secondary | ICD-10-CM | POA: Diagnosis present

## 2014-06-17 DIAGNOSIS — Z1231 Encounter for screening mammogram for malignant neoplasm of breast: Secondary | ICD-10-CM

## 2014-06-17 LAB — MULTIPLE MYELOMA PANEL, SERUM
ALBUMIN SERPL ELPH-MCNC: 3.4 g/dL (ref 3.2–5.6)
ALBUMIN/GLOB SERPL: 1 (ref 0.7–2.0)
Alpha 1: 0.3 g/dL (ref 0.1–0.4)
Alpha2 Glob SerPl Elph-Mcnc: 1 g/dL (ref 0.4–1.2)
B-Globulin SerPl Elph-Mcnc: 1.2 g/dL (ref 0.6–1.3)
GLOBULIN, TOTAL: 3.7 g/dL (ref 2.0–4.5)
Gamma Glob SerPl Elph-Mcnc: 1.2 g/dL (ref 0.5–1.6)
IGG (IMMUNOGLOBIN G), SERUM: 1124 mg/dL (ref 700–1600)
IGM, SERUM: 69 mg/dL (ref 26–217)
IgA: 347 mg/dL (ref 87–352)
Total Protein ELP: 7.1 g/dL (ref 6.0–8.5)

## 2014-06-17 LAB — ERYTHROPOIETIN: Erythropoietin: 34 m[IU]/mL — ABNORMAL HIGH (ref 2.6–18.5)

## 2014-06-17 LAB — PREPARE RBC (CROSSMATCH)

## 2014-06-17 LAB — KAPPA/LAMBDA LIGHT CHAINS
Kappa free light chain: 53.72 mg/L — ABNORMAL HIGH (ref 3.30–19.40)
Kappa, lambda light chain ratio: 2.17 — ABNORMAL HIGH (ref 0.26–1.65)
LAMDA FREE LIGHT CHAINS: 24.7 mg/L (ref 5.71–26.30)

## 2014-06-17 LAB — HAPTOGLOBIN: HAPTOGLOBIN: 300 mg/dL — AB (ref 34–200)

## 2014-06-17 LAB — BETA 2 MICROGLOBULIN, SERUM: BETA 2 MICROGLOBULIN: 3.3 mg/L — AB (ref 0.6–2.4)

## 2014-06-17 LAB — ABO/RH: ABO/RH(D): A NEG

## 2014-06-17 MED ORDER — SODIUM CHLORIDE 0.9 % IV SOLN
250.0000 mL | Freq: Once | INTRAVENOUS | Status: AC
  Administered 2014-06-17: 250 mL via INTRAVENOUS

## 2014-06-17 MED ORDER — ACETAMINOPHEN 325 MG PO TABS
650.0000 mg | ORAL_TABLET | Freq: Once | ORAL | Status: AC
  Administered 2014-06-17: 650 mg via ORAL
  Filled 2014-06-17: qty 2

## 2014-06-17 MED ORDER — DIPHENHYDRAMINE HCL 25 MG PO CAPS
25.0000 mg | ORAL_CAPSULE | Freq: Once | ORAL | Status: AC
  Administered 2014-06-17: 25 mg via ORAL
  Filled 2014-06-17: qty 1

## 2014-06-17 MED ORDER — SODIUM CHLORIDE 0.9 % IJ SOLN
10.0000 mL | INTRAMUSCULAR | Status: AC | PRN
  Administered 2014-06-17: 10 mL

## 2014-06-17 NOTE — Progress Notes (Signed)
Tolerated well

## 2014-06-17 NOTE — Progress Notes (Signed)
Patient identified to be at risk for malnutrition on the MST secondary to Wt loss and poor PO intake  Contacted Pt by visiting her during her transfusion  Wt Readings from Last 10 Encounters:  06/16/14 118 lb 8 oz (53.751 kg)  06/10/14 123 lb (55.792 kg)  05/24/14 123 lb 3.2 oz (55.883 kg)  05/11/14 123 lb 12.8 oz (56.155 kg)  05/06/14 126 lb (57.153 kg)  04/14/14 124 lb (56.246 kg)  04/10/14 125 lb 6.4 oz (56.881 kg)  02/28/14 126 lb (57.153 kg)  01/27/14 124 lb 12.8 oz (56.609 kg)  12/30/13 123 lb (55.792 kg)   Patient weight has decreased by about 10 lbs in the last month and a half.  Patient reports oral intake as fair and is suffering from symptoms including a reduced appetite and fatigue  Pt does not cook or shop for herself and is living at a family care home. She says she will likely be there until she is able to get a Psychologist, forensic.   She thinks the reason she has lost weight is because she has just been too weak/tired to eat. She eats 3 meals, but admits they are smaller.  We discussed weight gaining strategies and what foods she should try to eat. The facility she lives at only serves chicken as its meat. Told her to eat that as often as she can. Also directed her towards nuts, dressings, cheese, cottage cheeses, juice and protein bars/shakes.   I also suggested that she eat her protein first at every meal and try to eat a little something in between meals.  Pt seemed to be receptive and appreciative for the information  Will follow her weight at her next appt.   Burtis Junes RD, LDN Nutrition Pager: [phone] 06/17/2014 1:54 PM

## 2014-06-17 NOTE — Progress Notes (Signed)
Labs drawn

## 2014-06-17 NOTE — Patient Instructions (Signed)
Blood transfusion today and you tolerated this well. Return next week as scheduled for ironBlood Transfusion Information WHAT IS A BLOOD TRANSFUSION? A transfusion is the replacement of blood or some of its parts. Blood is made up of multiple cells which provide different functions.  Red blood cells carry oxygen and are used for blood loss replacement.  White blood cells fight against infection.  Platelets control bleeding.  Plasma helps clot blood.  Other blood products are available for specialized needs, such as hemophilia or other clotting disorders. BEFORE THE TRANSFUSION  Who gives blood for transfusions?   You may be able to donate blood to be used at a later date on yourself (autologous donation).  Relatives can be asked to donate blood. This is generally not any safer than if you have received blood from a stranger. The same precautions are taken to ensure safety when a relative's blood is donated.  Healthy volunteers who are fully evaluated to make sure their blood is safe. This is blood bank blood. Transfusion therapy is the safest it has ever been in the practice of medicine. Before blood is taken from a donor, a complete history is taken to make sure that person has no history of diseases nor engages in risky social behavior (examples are intravenous drug use or sexual activity with multiple partners). The donor's travel history is screened to minimize risk of transmitting infections, such as malaria. The donated blood is tested for signs of infectious diseases, such as HIV and hepatitis. The blood is then tested to be sure it is compatible with you in order to minimize the chance of a transfusion reaction. If you or a relative donates blood, this is often done in anticipation of surgery and is not appropriate for emergency situations. It takes many days to process the donated blood. RISKS AND COMPLICATIONS Although transfusion therapy is very safe and saves many lives, the main  dangers of transfusion include:   Getting an infectious disease.  Developing a transfusion reaction. This is an allergic reaction to something in the blood you were given. Every precaution is taken to prevent this. The decision to have a blood transfusion has been considered carefully by your caregiver before blood is given. Blood is not given unless the benefits outweigh the risks. AFTER THE TRANSFUSION  Right after receiving a blood transfusion, you will usually feel much better and more energetic. This is especially true if your red blood cells have gotten low (anemic). The transfusion raises the level of the red blood cells which carry oxygen, and this usually causes an energy increase.  The nurse administering the transfusion will monitor you carefully for complications. HOME CARE INSTRUCTIONS  No special instructions are needed after a transfusion. You may find your energy is better. Speak with your caregiver about any limitations on activity for underlying diseases you may have. SEEK MEDICAL CARE IF:   Your condition is not improving after your transfusion.  You develop redness or irritation at the intravenous (IV) site. SEEK IMMEDIATE MEDICAL CARE IF:  Any of the following symptoms occur over the next 12 hours:  Shaking chills.  You have a temperature by mouth above 102 F (38.9 C), not controlled by medicine.  Chest, back, or muscle pain.  People around you feel you are not acting correctly or are confused.  Shortness of breath or difficulty breathing.  Dizziness and fainting.  You get a rash or develop hives.  You have a decrease in urine output.  Your urine turns  a dark color or changes to pink, red, or brown. Any of the following symptoms occur over the next 10 days:  You have a temperature by mouth above 102 F (38.9 C), not controlled by medicine.  Shortness of breath.  Weakness after normal activity.  The white part of the eye turns yellow  (jaundice).  You have a decrease in the amount of urine or are urinating less often.  Your urine turns a dark color or changes to pink, red, or brown. Document Released: 01/05/2000 Document Revised: 04/01/2011 Document Reviewed: 08/24/2007 [facility] Patient Information 2015 [place], [place]. This information is not intended to replace advice given to you by your health care provider. Make sure you discuss any questions you have with your health care provider. Blood Transfusion Information WHAT IS A BLOOD TRANSFUSION? A transfusion is the replacement of blood or some of its parts. Blood is made up of multiple cells which provide different functions.  Red blood cells carry oxygen and are used for blood loss replacement.  White blood cells fight against infection.  Platelets control bleeding.  Plasma helps clot blood.  Other blood products are available for specialized needs, such as hemophilia or other clotting disorders. BEFORE THE TRANSFUSION  Who gives blood for transfusions?   You may be able to donate blood to be used at a later date on yourself (autologous donation).  Relatives can be asked to donate blood. This is generally not any safer than if you have received blood from a stranger. The same precautions are taken to ensure safety when a relative's blood is donated.  Healthy volunteers who are fully evaluated to make sure their blood is safe. This is blood bank blood. Transfusion therapy is the safest it has ever been in the practice of medicine. Before blood is taken from a donor, a complete history is taken to make sure that person has no history of diseases nor engages in risky social behavior (examples are intravenous drug use or sexual activity with multiple partners). The donor's travel history is screened to minimize risk of transmitting infections, such as malaria. The donated blood is tested for signs of infectious diseases, such as HIV and hepatitis. The blood is then  tested to be sure it is compatible with you in order to minimize the chance of a transfusion reaction. If you or a relative donates blood, this is often done in anticipation of surgery and is not appropriate for emergency situations. It takes many days to process the donated blood. RISKS AND COMPLICATIONS Although transfusion therapy is very safe and saves many lives, the main dangers of transfusion include:   Getting an infectious disease.  Developing a transfusion reaction. This is an allergic reaction to something in the blood you were given. Every precaution is taken to prevent this. The decision to have a blood transfusion has been considered carefully by your caregiver before blood is given. Blood is not given unless the benefits outweigh the risks. AFTER THE TRANSFUSION  Right after receiving a blood transfusion, you will usually feel much better and more energetic. This is especially true if your red blood cells have gotten low (anemic). The transfusion raises the level of the red blood cells which carry oxygen, and this usually causes an energy increase.  The nurse administering the transfusion will monitor you carefully for complications. HOME CARE INSTRUCTIONS  No special instructions are needed after a transfusion. You may find your energy is better. Speak with your caregiver about any limitations on activity for  underlying diseases you may have. SEEK MEDICAL CARE IF:   Your condition is not improving after your transfusion.  You develop redness or irritation at the intravenous (IV) site. SEEK IMMEDIATE MEDICAL CARE IF:  Any of the following symptoms occur over the next 12 hours:  Shaking chills.  You have a temperature by mouth above 102 F (38.9 C), not controlled by medicine.  Chest, back, or muscle pain.  People around you feel you are not acting correctly or are confused.  Shortness of breath or difficulty breathing.  Dizziness and fainting.  You get a rash or  develop hives.  You have a decrease in urine output.  Your urine turns a dark color or changes to pink, red, or brown. Any of the following symptoms occur over the next 10 days:  You have a temperature by mouth above 102 F (38.9 C), not controlled by medicine.  Shortness of breath.  Weakness after normal activity.  The white part of the eye turns yellow (jaundice).  You have a decrease in the amount of urine or are urinating less often.  Your urine turns a dark color or changes to pink, red, or brown. Document Released: 01/05/2000 Document Revised: 04/01/2011 Document Reviewed: 08/24/2007 [facility] Patient Information 2015 [place], [place]. This information is not intended to replace advice given to you by your health care provider. Make sure you discuss any questions you have with your health care provider.

## 2014-06-18 LAB — TYPE AND SCREEN
ABO/RH(D): A NEG
Antibody Screen: NEGATIVE
UNIT DIVISION: 0
Unit division: 0

## 2014-06-24 ENCOUNTER — Encounter (HOSPITAL_COMMUNITY): Payer: Self-pay

## 2014-06-24 ENCOUNTER — Encounter (HOSPITAL_COMMUNITY): Payer: Medicaid Other | Attending: Oncology

## 2014-06-24 VITALS — BP 93/68 | HR 56 | Temp 97.8°F | Resp 18

## 2014-06-24 DIAGNOSIS — D649 Anemia, unspecified: Secondary | ICD-10-CM | POA: Diagnosis present

## 2014-06-24 MED ORDER — SODIUM CHLORIDE 0.9 % IV SOLN
510.0000 mg | Freq: Once | INTRAVENOUS | Status: AC
  Administered 2014-06-24: 510 mg via INTRAVENOUS
  Filled 2014-06-24: qty 17

## 2014-06-24 MED ORDER — SODIUM CHLORIDE 0.9 % IV SOLN
Freq: Once | INTRAVENOUS | Status: AC
  Administered 2014-06-24: 14:00:00 via INTRAVENOUS

## 2014-06-24 NOTE — Patient Instructions (Signed)
[facility] Discharge Instructions  RECOMMENDATIONS MADE BY THE CONSULTANT AND ANY TEST RESULTS WILL BE SENT TO YOUR REFERRING PHYSICIAN.  Iron infusion today Follow up as scheduled Please call the clinic if you have any questions or concerns  Thank you for choosing [facility] to provide your oncology and hematology care.  To afford each patient quality time with our provider, please arrive at least 15 minutes before your scheduled appointment time.    You need to re-schedule your appointment should you arrive 10 or more minutes late.  We strive to give you quality time with our providers, and arriving late affects you and other patients whose appointments are after yours.  Also, if you no show three or more times for appointments you may be dismissed from the clinic at the providers discretion.     Again, thank you for choosing [facility].  Our hope is that these requests will decrease the amount of time that you wait before being seen by our physicians.       _____________________________________________________________  Should you have questions after your visit to [facility], please contact our office at [phone] between the hours of 8:30 a.m. and 4:30 p.m.  Voicemails left after 4:30 p.m. will not be returned until the following business day.  For prescription refill requests, have your pharmacy contact our office.    

## 2014-06-24 NOTE — Progress Notes (Signed)
[name] Tolerated iron infusion well today Discharged ambulatory

## 2014-06-27 NOTE — Telephone Encounter (Signed)
OV MADE °

## 2014-06-28 ENCOUNTER — Encounter: Payer: Self-pay | Admitting: Cardiology

## 2014-07-13 ENCOUNTER — Ambulatory Visit (HOSPITAL_COMMUNITY)
Admission: RE | Admit: 2014-07-13 | Discharge: 2014-07-13 | Disposition: A | Discharge status: Home / Self Care | Payer: Medicaid Other | Source: Ambulatory Visit | Attending: Oncology | Admitting: Oncology

## 2014-07-13 ENCOUNTER — Encounter (HOSPITAL_BASED_OUTPATIENT_CLINIC_OR_DEPARTMENT_OTHER): Payer: Medicaid Other

## 2014-07-13 DIAGNOSIS — D649 Anemia, unspecified: Secondary | ICD-10-CM | POA: Diagnosis present

## 2014-07-13 DIAGNOSIS — Z1231 Encounter for screening mammogram for malignant neoplasm of breast: Secondary | ICD-10-CM | POA: Insufficient documentation

## 2014-07-13 LAB — IRON AND TIBC
Iron: 99 ug/dL (ref 28–170)
Saturation Ratios: 33 % — ABNORMAL HIGH (ref 10.4–31.8)
TIBC: 302 ug/dL (ref 250–450)
UIBC: 203 ug/dL

## 2014-07-13 LAB — CBC WITH DIFFERENTIAL/PLATELET
BASOS ABS: 0 10*3/uL (ref 0.0–0.1)
BASOS PCT: 1 % (ref 0–1)
EOS ABS: 0.2 10*3/uL (ref 0.0–0.7)
Eosinophils Relative: 4 % (ref 0–5)
HCT: 37 % (ref 36.0–46.0)
Hemoglobin: 11.9 g/dL — ABNORMAL LOW (ref 12.0–15.0)
Lymphocytes Relative: 24 % (ref 12–46)
Lymphs Abs: 1.2 10*3/uL (ref 0.7–4.0)
MCH: 25.1 pg — AB (ref 26.0–34.0)
MCHC: 32.2 g/dL (ref 30.0–36.0)
MCV: 77.9 fL — ABNORMAL LOW (ref 78.0–100.0)
Monocytes Absolute: 0.7 10*3/uL (ref 0.1–1.0)
Monocytes Relative: 13 % — ABNORMAL HIGH (ref 3–12)
Neutro Abs: 3.1 10*3/uL (ref 1.7–7.7)
Neutrophils Relative %: 58 % (ref 43–77)
PLATELETS: 214 10*3/uL (ref 150–400)
RBC: 4.75 MIL/uL (ref 3.87–5.11)
RDW: 17.9 % — ABNORMAL HIGH (ref 11.5–15.5)
WBC: 5.2 10*3/uL (ref 4.0–10.5)

## 2014-07-13 LAB — FERRITIN: Ferritin: 360 ng/mL — ABNORMAL HIGH (ref 11–307)

## 2014-07-14 ENCOUNTER — Encounter (HOSPITAL_COMMUNITY): Payer: Self-pay | Admitting: Oncology

## 2014-07-14 ENCOUNTER — Other Ambulatory Visit (HOSPITAL_COMMUNITY): Payer: Self-pay | Admitting: Oncology

## 2014-07-14 ENCOUNTER — Encounter (HOSPITAL_BASED_OUTPATIENT_CLINIC_OR_DEPARTMENT_OTHER): Payer: Medicaid Other | Admitting: Oncology

## 2014-07-14 VITALS — BP 109/59 | HR 74 | Temp 98.7°F | Resp 18 | Wt 116.4 lb

## 2014-07-14 DIAGNOSIS — D500 Iron deficiency anemia secondary to blood loss (chronic): Secondary | ICD-10-CM

## 2014-07-14 DIAGNOSIS — N63 Unspecified lump in breast: Secondary | ICD-10-CM

## 2014-07-14 DIAGNOSIS — N6310 Unspecified lump in the right breast, unspecified quadrant: Secondary | ICD-10-CM

## 2014-07-14 DIAGNOSIS — R928 Other abnormal and inconclusive findings on diagnostic imaging of breast: Secondary | ICD-10-CM

## 2014-07-14 HISTORY — DX: Unspecified lump in the right breast, unspecified quadrant: N63.10

## 2014-07-14 NOTE — Progress Notes (Signed)
[name] Ends, MD [place] [address]  Mass of right breast on mammogram - Plan: MM Digital Diagnostic Unilat R, US Breast Complete Uni Right Inc Axilla  Iron deficiency anemia due to chronic blood loss  CURRENT THERAPY: S/P IV Feraheme 510 mg on 06/24/2014 and 2 unit PRBC transfusion on 06/17/2014.  INTERVAL HISTORY: [name] 59 y.o. female returns for followup of iron deficiency anemia with Colonscopy on 03/29/2014 being negative and EGD on 11/22/2013 demonstrating duodenitis and gastritis.  I personally reviewed and went over laboratory results with the patient.  The results are noted within this dictation.  Her Hgb has improved to 11.9 g/dL, compared to 6.8 g/dL on 06/16/2014 when she was seen as a new patient at [facility].  She has received 2 units of PRBCs (06/17/2014) and Feraheme 510 mg IV (06/24/2014).  She has demonstrated an excellent response to intervention thus far.  I personally reviewed and went over radiographic studies with the patient.  The results are noted within this dictation.  Mammogram on 07/13/2014 demonstrates a possible mass in the right breast that needs further evaluation.  She is now set-up for a diagnostic mammogram with [place] of right breast.  We will be on the look-out for those results.  We will have her return following diagnostic mammogram.   Past Medical History  Diagnosis Date  . Diverticulosis   . Pancreatitis 1980, 2015    in the setting of ETOH  . chronic systolic heart failure     a. ECHO (05/08/13): EF 20-25%, diff HK with akinesis of basal inferior wall, grade 1 DD, trivial MR, trivial TR b) RHC (05/06/13): RA 7, RV 30/2, PA 31/19 (24), PCWP 10, Fick CO/CI: 2.9/1.74, Thermo CO/Ci: 2.4/1.4, PA sat 53%  . Ischemic cardiomyopathy   . CAD (coronary artery disease)     a. LHC (04/2013): Chronically occluded LAD, moderate dz in large OM1 and severe dz and small posterior lateral vessel    . Persistent atrial fibrillation 2015    . Suicidal overdose 10/03/13    Overdoes on Beta Blockers & Xarelto [facility] ED)  . Anxiety 2015   . Psoriasis 1968  . GERD (gastroesophageal reflux disease) 2015   . Hyperlipidemia 2015  . Hypertension 2015  . ETOH abuse 1981    started abusing alcohol at age 65, quit   . Stroke 2011    left side weakness, minimal  . Hypothyroidism   . PONV (postoperative nausea and vomiting)   . Mass of right breast on mammogram 07/14/2014    has Hypertension; GERD (gastroesophageal reflux disease); H/O alcohol abuse; Tobacco abuse; Family history of premature CAD; Chronic systolic CHF (congestive heart failure); PAF (paroxysmal atrial fibrillation); Cardiomyopathy, ischemic- EF 25-30% 2D 09/02/13; CAD- total LAD- residual OM2 disease; Anxiety state; Suicide attempt by beta blocker overdose; Psoriasis; CKD (chronic kidney disease) stage 3, GFR 30-59 ml/min; FH: colon cancer; Cervical cancer screening; Hypothyroidism; Gastritis and gastroduodenitis; Anemia; Acute on chronic systolic CHF (congestive heart failure); CHF (congestive heart failure); CHF exacerbation; and Mass of right breast on mammogram on her problem list.     is allergic to morphine and related and penicillins.  Current Outpatient Prescriptions on File Prior to Visit  Medication Sig Dispense Refill  . acetaminophen (TYLENOL) 325 MG tablet Take 650 mg by mouth every 6 (six) hours as needed for fever or headache.    Marland Kitchen apixaban (ELIQUIS) 5 MG TABS tablet Take 1 tablet (5 mg total)  by mouth 2 (two) times daily. 60 tablet 3  . atorvastatin (LIPITOR) 80 MG tablet Take 1 tablet (80 mg total) by mouth daily. 30 tablet 6  . carvedilol (COREG) 6.25 MG tablet Take 1 tablet (6.25 mg total) by mouth 2 (two) times daily with a meal. Hold for heart rate less than 60 60 tablet 3  . furosemide (LASIX) 20 MG tablet Take 1 tablet (20 mg total) by mouth every other day. 15 tablet 6  . levothyroxine (SYNTHROID, LEVOTHROID) 25 MCG tablet Take 0.125 mcg by mouth  daily before breakfast.    . lisinopril (PRINIVIL,ZESTRIL) 5 MG tablet Take 1 tablet (5 mg total) by mouth daily. 90 tablet 3  . LORazepam (ATIVAN) 0.5 MG tablet Take 1 tablet (0.5 mg total) by mouth 2 (two) times daily. 60 tablet 2  . pantoprazole (PROTONIX) 40 MG tablet TAKE 1 TABLET BY MOUTH TWICE DAILY BEFORE A MEAL. 60 tablet 5  . sertraline (ZOLOFT) 50 MG tablet Take 1 tablet (50 mg total) by mouth daily. 90 tablet 1  . spironolactone (ALDACTONE) 25 MG tablet Take 0.5 tablets (12.5 mg total) by mouth daily. 15 tablet 3  . nitroGLYCERIN (NITROSTAT) 0.3 MG SL tablet Place 0.3 mg under the tongue every 5 (five) minutes as needed for chest pain (MAX 3 TABLETS).      No current facility-administered medications on file prior to visit.    Past Surgical History  Procedure Laterality Date  . Colostomy takedown  2007  . Exploratory laparotomy with colon resection, colostomy  2007  . Esophagogastroduodenoscopy (egd) with propofol N/A 11/22/2013    Dr. Oneida Alar: gastritis and duodenitis, negative H.pylori  . Esophageal biopsy N/A 11/22/2013    Procedure: GASTRIC BIOPSIES;  Surgeon: Danie Binder, MD;  Location: [facility];  Service: Endoscopy;  Laterality: N/A;  . Left and right heart catheterization with coronary angiogram N/A 05/10/2013    Procedure: LEFT AND RIGHT HEART CATHETERIZATION WITH CORONARY ANGIOGRAM;  Surgeon: [name], MD;  Location: [facility] CATH LAB;  Service: Cardiovascular;  Laterality: N/A;  . Left heart catheterization with coronary angiogram N/A 09/03/2013    Procedure: LEFT HEART CATHETERIZATION WITH CORONARY ANGIOGRAM;  Surgeon: Sinclair Grooms, MD;  Location: [facility] CATH LAB;  Service: Cardiovascular;  Laterality: N/A;  . Colonoscopy with propofol N/A 03/29/2014    SLF: 1. No definite source for anemia identified 2. 8 colon polyps removed 3. Severe diverticulosis noted the transverse colon  and right colon 4. Small internal hemorrohids.   . Polypectomy N/A 03/29/2014    Procedure:  POLYPECTOMY;  Surgeon: Danie Binder, MD;  Location: [facility] ORS;  Service: Endoscopy;  Laterality: N/A;  ascending colon, sigmoid colon x4, rectal x3  . Left and right heart catheterization with coronary angiogram N/A 04/14/2014    Procedure: LEFT AND RIGHT HEART CATHETERIZATION WITH CORONARY ANGIOGRAM;  Surgeon: Larey Dresser, MD;  Location: [facility] CATH LAB;  Service: Cardiovascular;  Laterality: N/A;  . Agile capsule N/A 06/02/2014    Procedure: AGILE CAPSULE;  Surgeon: Danie Binder, MD;  Location: [facility] ENDO SUITE;  Service: Endoscopy;  Laterality: N/A;  0800  . Givens capsule study N/A 06/10/2014    Procedure: GIVENS CAPSULE STUDY;  Surgeon: Danie Binder, MD;  Location: [facility] END[facility];  Service: Endoscopy;  Laterality: N/A;  0700    Denies any headaches, dizziness, double vision, fevers, chills, night sweats, nausea, vomiting, diarrhea, constipation, chest pain, heart palpitations, shortness of breath, blood in stool, black tarry stool, urinary  pain, urinary burning, urinary frequency, hematuria.   PHYSICAL EXAMINATION  ECOG PERFORMANCE STATUS: 0 - Asymptomatic  Filed Vitals:   07/14/14 1335  BP: 109/59  Pulse: 74  Temp: 98.7 F (37.1 C)  Resp: 18    GENERAL:alert, no distress, well nourished, well developed, comfortable, cooperative and smiling SKIN: skin color, texture, turgor are normal, no rashes or significant lesions HEAD: Normocephalic, No masses, lesions, tenderness or abnormalities EYES: normal, PERRLA, EOMI, Conjunctiva are pink and non-injected EARS: External ears normal OROPHARYNX:lips, buccal mucosa, and tongue normal and mucous membranes are moist  NECK: supple, no adenopathy, thyroid normal size, non-tender, without nodularity, no stridor, non-tender, trachea midline LYMPH:  no palpable lymphadenopathy, no hepatosplenomegaly BREAST:left breast normal without mass, skin or nipple changes or axillary nodes, abnormal mass palpable in the 4 o clock position measuring about 1.5  cm in size.  It is mobile. LUNGS: clear to auscultation and percussion HEART: regular rate & rhythm, no murmurs, no gallops, S1 normal and S2 normal ABDOMEN:abdomen soft, non-tender and normal bowel sounds BACK: Back symmetric, no curvature., No CVA tenderness EXTREMITIES:less then 2 second capillary refill, no joint deformities, effusion, or inflammation, no edema, no skin discoloration, no clubbing, no cyanosis  NEURO: alert & oriented x 3 with fluent speech, no focal motor/sensory deficits, gait normal   LABORATORY DATA: CBC    Component Value Date/Time   WBC 5.2 07/13/2014 0910   RBC 4.75 07/13/2014 0910   RBC 3.00* 06/16/2014 1705   HGB 11.9* 07/13/2014 0910   HCT 37.0 07/13/2014 0910   PLT 214 07/13/2014 0910   MCV 77.9* 07/13/2014 0910   MCH 25.1* 07/13/2014 0910   MCHC 32.2 07/13/2014 0910   RDW 17.9* 07/13/2014 0910   LYMPHSABS 1.2 07/13/2014 0910   MONOABS 0.7 07/13/2014 0910   EOSABS 0.2 07/13/2014 0910   BASOSABS 0.0 07/13/2014 0910      Chemistry      Component Value Date/Time   NA 139 06/16/2014 1705   K 4.2 06/16/2014 1705   CL 103 06/16/2014 1705   CO2 26 06/16/2014 1705   BUN 25* 06/16/2014 1705   CREATININE 1.36* 06/16/2014 1705      Component Value Date/Time   CALCIUM 8.9 06/16/2014 1705   ALKPHOS 76 06/16/2014 1705   AST 24 06/16/2014 1705   ALT 23 06/16/2014 1705   BILITOT 0.2* 06/16/2014 1705       RADIOGRAPHIC STUDIES:  Mm Digital Screening Bilateral  07/13/2014   CLINICAL DATA:  Screening.  EXAM: DIGITAL SCREENING BILATERAL MAMMOGRAM WITH CAD  COMPARISON:  None.  ACR Breast Density Category b: There are scattered areas of fibroglandular density.  FINDINGS: In the right breast, a possible mass warrants further evaluation. In the left breast, no findings suspicious for malignancy. Images were processed with CAD.  IMPRESSION: Further evaluation is suggested for possible mass in the right breast.  RECOMMENDATION: Diagnostic mammogram and  possibly ultrasound of the right breast. (Code:[id number])  The patient will be contacted regarding the findings, and additional imaging will be scheduled.  BI-RADS CATEGORY  0: Incomplete. Need additional imaging evaluation and/or prior mammograms for comparison.   Electronically Signed   By: Everlean Alstrom M.D.   On: 07/13/2014 16:11     ASSESSMENT AND PLAN:  Anemia Iron deficiency anemia in the setting of chronic anticoagulation with Eliquis.  S/P 2 unit PRBC on 06/17/2014 and 510 mg of IV Feraheme on 06/24/2014 with excellent response thus far.  Labs in 6 weeks: CBC diff, iron/TIBC, Ferritin.  Return in 2-3 weeks for follow-up.  Mass of right breast on mammogram I set the patient up for a screening mammogram since she was long overdue.  This resulted with a right breast mass.  Diagnostic right mammogram with [place] ordered.  Return in 2-3 weeks for follow-up of right breast mass.    THERAPY PLAN:  She needs worked-up for her right breast mass.  We will see her back after her diagnostic mammogram.  All questions were answered. The patient knows to call the clinic with any problems, questions or concerns. We can certainly see the patient much sooner if necessary.  Patient and plan discussed with Dr. Ancil Linsey and she is in agreement with the aforementioned.   This note is electronically signed by: Doy Mince 07/14/2014 2:12 PM

## 2014-07-14 NOTE — Progress Notes (Signed)
Labs drawn

## 2014-07-14 NOTE — Patient Instructions (Signed)
..  [place] at [facility] Discharge Instructions  RECOMMENDATIONS MADE BY THE CONSULTANT AND ANY TEST RESULTS WILL BE SENT TO YOUR REFERRING PHYSICIAN.  Diagnostic mammogram with u/s. Return after mammogram to obtain results. Blood work in 6 weeks.    Thank you for choosing [place] at [facility] to provide your oncology and hematology care.  To afford each patient quality time with our provider, please arrive at least 15 minutes before your scheduled appointment time.    You need to re-schedule your appointment should you arrive 10 or more minutes late.  We strive to give you quality time with our providers, and arriving late affects you and other patients whose appointments are after yours.  Also, if you no show three or more times for appointments you may be dismissed from the clinic at the providers discretion.     Again, thank you for choosing [facility].  Our hope is that these requests will decrease the amount of time that you wait before being seen by our physicians.       _____________________________________________________________  Should you have questions after your visit to [facility], please contact our office at [phone] between the hours of 8:30 a.m. and 4:30 p.m.  Voicemails left after 4:30 p.m. will not be returned until the following business day.  For prescription refill requests, have your pharmacy contact our office.

## 2014-07-14 NOTE — Assessment & Plan Note (Addendum)
Iron deficiency anemia in the setting of chronic anticoagulation with Eliquis.  S/P 2 unit PRBC on 06/17/2014 and 510 mg of IV Feraheme on 06/24/2014 with excellent response thus far.  Labs in 6 weeks: CBC diff, iron/TIBC, Ferritin.  Return in 2-3 weeks for follow-up.

## 2014-07-14 NOTE — Assessment & Plan Note (Signed)
I set the patient up for a screening mammogram since she was long overdue.  This resulted with a right breast mass.  Diagnostic right mammogram with [place] ordered.  Return in 2-3 weeks for follow-up of right breast mass.

## 2014-08-02 ENCOUNTER — Ambulatory Visit (HOSPITAL_COMMUNITY)
Admission: RE | Admit: 2014-08-02 | Discharge: 2014-08-02 | Disposition: A | Discharge status: Home / Self Care | Payer: Medicaid Other | Source: Ambulatory Visit | Attending: Oncology | Admitting: Oncology

## 2014-08-02 ENCOUNTER — Other Ambulatory Visit (HOSPITAL_COMMUNITY): Payer: Self-pay | Admitting: Oncology

## 2014-08-02 DIAGNOSIS — N6310 Unspecified lump in the right breast, unspecified quadrant: Secondary | ICD-10-CM

## 2014-08-02 DIAGNOSIS — N63 Unspecified lump in breast: Secondary | ICD-10-CM | POA: Diagnosis present

## 2014-08-04 ENCOUNTER — Encounter (HOSPITAL_COMMUNITY): Payer: Medicaid Other | Attending: Hematology & Oncology

## 2014-08-04 ENCOUNTER — Encounter (HOSPITAL_COMMUNITY): Payer: Medicaid Other | Attending: Oncology | Admitting: Hematology & Oncology

## 2014-08-04 ENCOUNTER — Encounter (HOSPITAL_COMMUNITY): Payer: Self-pay | Admitting: Hematology & Oncology

## 2014-08-04 VITALS — BP 99/63 | HR 57 | Temp 98.0°F | Resp 14 | Wt 118.3 lb

## 2014-08-04 DIAGNOSIS — E538 Deficiency of other specified B group vitamins: Secondary | ICD-10-CM | POA: Diagnosis not present

## 2014-08-04 DIAGNOSIS — D509 Iron deficiency anemia, unspecified: Secondary | ICD-10-CM

## 2014-08-04 DIAGNOSIS — I4891 Unspecified atrial fibrillation: Secondary | ICD-10-CM | POA: Diagnosis not present

## 2014-08-04 DIAGNOSIS — N6001 Solitary cyst of right breast: Secondary | ICD-10-CM

## 2014-08-04 DIAGNOSIS — D649 Anemia, unspecified: Secondary | ICD-10-CM | POA: Diagnosis present

## 2014-08-04 LAB — COMPREHENSIVE METABOLIC PANEL
ALBUMIN: 4.1 g/dL (ref 3.5–5.0)
ALT: 38 U/L (ref 14–54)
AST: 37 U/L (ref 15–41)
Alkaline Phosphatase: 80 U/L (ref 38–126)
Anion gap: 10 (ref 5–15)
BUN: 16 mg/dL (ref 6–20)
CO2: 28 mmol/L (ref 22–32)
Calcium: 9.6 mg/dL (ref 8.9–10.3)
Chloride: 104 mmol/L (ref 101–111)
Creatinine, Ser: 1.02 mg/dL — ABNORMAL HIGH (ref 0.44–1.00)
GFR calc Af Amer: >60 mL/min (ref >60)
GFR calc non Af Amer: 59 mL/min — ABNORMAL LOW (ref >60)
Glucose, Bld: 100 mg/dL — ABNORMAL HIGH (ref 65–99)
Potassium: 4.1 mmol/L (ref 3.5–5.1)
Sodium: 142 mmol/L (ref 135–145)
Total Bilirubin: 0.5 mg/dL (ref 0.3–1.2)
Total Protein: 7.8 g/dL (ref 6.5–8.1)

## 2014-08-04 LAB — FERRITIN: Ferritin: 204 ng/mL (ref 11–307)

## 2014-08-04 LAB — CBC WITH DIFFERENTIAL/PLATELET
BASOS PCT: 1 % (ref 0–1)
Basophils Absolute: 0 10*3/uL (ref 0.0–0.1)
Eosinophils Absolute: 0.2 10*3/uL (ref 0.0–0.7)
Eosinophils Relative: 4 % (ref 0–5)
HCT: 35.7 % — ABNORMAL LOW (ref 36.0–46.0)
Hemoglobin: 11.7 g/dL — ABNORMAL LOW (ref 12.0–15.0)
LYMPHS ABS: 1.5 10*3/uL (ref 0.7–4.0)
Lymphocytes Relative: 31 % (ref 12–46)
MCH: 25.7 pg — AB (ref 26.0–34.0)
MCHC: 32.8 g/dL (ref 30.0–36.0)
MCV: 78.5 fL (ref 78.0–100.0)
Monocytes Absolute: 0.5 10*3/uL (ref 0.1–1.0)
Monocytes Relative: 10 % (ref 3–12)
NEUTROS ABS: 2.7 10*3/uL (ref 1.7–7.7)
NEUTROS PCT: 54 % (ref 43–77)
PLATELETS: 246 10*3/uL (ref 150–400)
RBC: 4.55 MIL/uL (ref 3.87–5.11)
RDW: 20.2 % — AB (ref 11.5–15.5)
WBC: 5 10*3/uL (ref 4.0–10.5)

## 2014-08-04 MED ORDER — CYANOCOBALAMIN 1000 MCG/ML IJ SOLN
1000.0000 ug | Freq: Once | INTRAMUSCULAR | Status: AC
  Administered 2014-08-04: 1000 ug via INTRAMUSCULAR

## 2014-08-04 MED ORDER — CYANOCOBALAMIN 1000 MCG/ML IJ SOLN
INTRAMUSCULAR | Status: AC
  Filled 2014-08-04: qty 1

## 2014-08-04 NOTE — Progress Notes (Signed)
[name] presents today for injection per MD orders. B12 1000 mcg administered SQ in left Upper Arm. Administration without incident. Patient tolerated well.

## 2014-08-04 NOTE — Progress Notes (Signed)
[name] Ends, MD [name] [id number]  Iron deficiency Anemia R breast cyst  CURRENT THERAPY: S/P IV Feraheme 510 mg on 06/24/2014 and 2 unit PRBC transfusion on 06/17/2014.  INTERVAL HISTORY: [name] 59 y.o. female returns for followup of iron deficiency anemia with Colonscopy on 03/29/2014 being negative and EGD on 11/22/2013 demonstrating duodenitis and gastritis.  The patient is alone and presents today to discuss results from her recent mammogram and blood work.  She has had severe anemia in the past. She looks good today. She describes her mood as much improved.  I personally reviewed and went over laboratory results with the patient.  The results are noted within this dictation.  Her Hgb has improved to 11.9 g/dL, compared to 6.8 g/dL on 06/16/2014 when she was seen as a new patient at [facility].  She has received 2 units of PRBCs (06/17/2014) and Feraheme 510 mg IV (06/24/2014).  She has demonstrated an excellent response to intervention thus far.  The patient had an abnormal screening mammogram and ultrasound was performed showing a right breast simple cyst. She had questions regarding whether or not this needed to be surgically removed. It does not bother her. She understands she is to have repeat follow-up mammogram in one year.   She says that she does feel better and eat better and is excited at the possibility of soon being able to get her pacemaker surgery, done by Dr. Rayann Heman  Her cardiologist is Dr. Aundra Dubin, [facility]  She has no other complaints at this time.   Past Medical History  Diagnosis Date  . Diverticulosis   . Pancreatitis 1980, 2015    in the setting of ETOH  . chronic systolic heart failure     a. ECHO (05/08/13): EF 20-25%, diff HK with akinesis of basal inferior wall, grade 1 DD, trivial MR, trivial TR b) RHC (05/06/13): RA 7, RV 30/2, PA 31/19 (24), PCWP 10, Fick CO/CI: 2.9/1.74, Thermo CO/Ci: 2.4/1.4, PA sat 53%  . Ischemic  cardiomyopathy   . CAD (coronary artery disease)     a. LHC (04/2013): Chronically occluded LAD, moderate dz in large OM1 and severe dz and small posterior lateral vessel    . Persistent atrial fibrillation 2015  . Suicidal overdose 10/03/13    Overdoes on Beta Blockers & Xarelto [facility] ED)  . Anxiety 2015   . Psoriasis 1968  . GERD (gastroesophageal reflux disease) 2015   . Hyperlipidemia 2015  . Hypertension 2015  . ETOH abuse 1981    started abusing alcohol at age 89, quit   . Stroke 2011    left side weakness, minimal  . Hypothyroidism   . PONV (postoperative nausea and vomiting)   . Mass of right breast on mammogram 07/14/2014    has Hypertension; GERD (gastroesophageal reflux disease); H/O alcohol abuse; Tobacco abuse; Family history of premature CAD; Chronic systolic CHF (congestive heart failure); PAF (paroxysmal atrial fibrillation); Cardiomyopathy, ischemic- EF 25-30% 2D 09/02/13; CAD- total LAD- residual OM2 disease; Anxiety state; Suicide attempt by beta blocker overdose; Psoriasis; CKD (chronic kidney disease) stage 3, GFR 30-59 ml/min; FH: colon cancer; Cervical cancer screening; Hypothyroidism; Gastritis and gastroduodenitis; Anemia; Acute on chronic systolic CHF (congestive heart failure); CHF (congestive heart failure); CHF exacerbation; and Mass of right breast on mammogram on her problem list.     is allergic to morphine and related and penicillins.  Current Outpatient Prescriptions on File Prior to Visit  Medication Sig  Dispense Refill  . acetaminophen (TYLENOL) 325 MG tablet Take 650 mg by mouth every 6 (six) hours as needed for fever or headache.    Marland Kitchen apixaban (ELIQUIS) 5 MG TABS tablet Take 1 tablet (5 mg total) by mouth 2 (two) times daily. 60 tablet 3  . atorvastatin (LIPITOR) 80 MG tablet Take 1 tablet (80 mg total) by mouth daily. 30 tablet 6  . carvedilol (COREG) 6.25 MG tablet Take 1 tablet (6.25 mg total) by mouth 2 (two) times daily with a meal. Hold for heart  rate less than 60 60 tablet 3  . furosemide (LASIX) 20 MG tablet Take 1 tablet (20 mg total) by mouth every other day. 15 tablet 6  . levothyroxine (SYNTHROID, LEVOTHROID) 25 MCG tablet Take 0.125 mcg by mouth daily before breakfast.    . lisinopril (PRINIVIL,ZESTRIL) 5 MG tablet Take 1 tablet (5 mg total) by mouth daily. 90 tablet 3  . LORazepam (ATIVAN) 0.5 MG tablet Take 1 tablet (0.5 mg total) by mouth 2 (two) times daily. 60 tablet 2  . nitroGLYCERIN (NITROSTAT) 0.3 MG SL tablet Place 0.3 mg under the tongue every 5 (five) minutes as needed for chest pain (MAX 3 TABLETS).     . ondansetron (ZOFRAN) 4 MG tablet Take 4 mg by mouth every 8 (eight) hours as needed for nausea or vomiting.    . pantoprazole (PROTONIX) 40 MG tablet TAKE 1 TABLET BY MOUTH TWICE DAILY BEFORE A MEAL. 60 tablet 5  . sertraline (ZOLOFT) 50 MG tablet Take 1 tablet (50 mg total) by mouth daily. 90 tablet 1  . spironolactone (ALDACTONE) 25 MG tablet Take 0.5 tablets (12.5 mg total) by mouth daily. 15 tablet 3   No current facility-administered medications on file prior to visit.    Past Surgical History  Procedure Laterality Date  . Colostomy takedown  2007  . Exploratory laparotomy with colon resection, colostomy  2007  . Esophagogastroduodenoscopy (egd) with propofol N/A 11/22/2013    Dr. Oneida Alar: gastritis and duodenitis, negative H.pylori  . Esophageal biopsy N/A 11/22/2013    Procedure: GASTRIC BIOPSIES;  Surgeon: Danie Binder, MD;  Location: [facility];  Service: Endoscopy;  Laterality: N/A;  . Left and right heart catheterization with coronary angiogram N/A 05/10/2013    Procedure: LEFT AND RIGHT HEART CATHETERIZATION WITH CORONARY ANGIOGRAM;  Surgeon: [name], MD;  Location: [facility] CATH LAB;  Service: Cardiovascular;  Laterality: N/A;  . Left heart catheterization with coronary angiogram N/A 09/03/2013    Procedure: LEFT HEART CATHETERIZATION WITH CORONARY ANGIOGRAM;  Surgeon: Sinclair Grooms, MD;  Location: [facility]  CATH LAB;  Service: Cardiovascular;  Laterality: N/A;  . Colonoscopy with propofol N/A 03/29/2014    SLF: 1. No definite source for anemia identified 2. 8 colon polyps removed 3. Severe diverticulosis noted the transverse colon  and right colon 4. Small internal hemorrohids.   . Polypectomy N/A 03/29/2014    Procedure: POLYPECTOMY;  Surgeon: Danie Binder, MD;  Location: [facility];  Service: Endoscopy;  Laterality: N/A;  ascending colon, sigmoid colon x4, rectal x3  . Left and right heart catheterization with coronary angiogram N/A 04/14/2014    Procedure: LEFT AND RIGHT HEART CATHETERIZATION WITH CORONARY ANGIOGRAM;  Surgeon: Larey Dresser, MD;  Location: [facility] CATH LAB;  Service: Cardiovascular;  Laterality: N/A;  . Agile capsule N/A 06/02/2014    Procedure: AGILE CAPSULE;  Surgeon: Danie Binder, MD;  Location: [facility] ENDO [facility];  Service: Endoscopy;  Laterality: N/A;  0800  .  Givens capsule study N/A 06/10/2014    Procedure: GIVENS CAPSULE STUDY;  Surgeon: Danie Binder, MD;  Location: [facility];  Service: Endoscopy;  Laterality: N/A;  0700    Denies any headaches, dizziness, double vision, fevers, chills, night sweats, nausea, vomiting, diarrhea, constipation, chest pain, heart palpitations, shortness of breath, blood in stool, black tarry stool, urinary pain, urinary burning, urinary frequency, hematuria. 14 point review of systems was performed and is negative except as detailed under history of present illness and above    PHYSICAL EXAMINATION  ECOG PERFORMANCE STATUS: 0 - Asymptomatic  Filed Vitals:   08/04/14 0948  BP: 99/63  Pulse: 57  Temp: 98 F (36.7 C)  Resp: 14    GENERAL:alert, no distress, well nourished, well developed, comfortable, cooperative and smiling SKIN: skin color, texture, turgor are normal, no rashes or significant lesions HEAD: Normocephalic, No masses, lesions, tenderness or abnormalities EYES: normal, PERRLA, EOMI, Conjunctiva are pink and non-injected EARS:  External ears normal OROPHARYNX:lips, buccal mucosa, and tongue normal and mucous membranes are moist  NECK: supple, no adenopathy, thyroid normal size, non-tender, without nodularity, no stridor, non-tender, trachea midline LYMPH:  no palpable lymphadenopathy, no hepatosplenomegaly BREAST:left breast normal without mass, skin or nipple changes or axillary nodes, abnormal mass palpable in the 4 o clock position measuring about 1.5 cm in size.  It is mobile. LUNGS: clear to auscultation and percussion HEART: regular rate & rhythm, no murmurs, no gallops, S1 normal and S2 normal ABDOMEN:abdomen soft, non-tender and normal bowel sounds BACK: Back symmetric, no curvature., No CVA tenderness EXTREMITIES:less then 2 second capillary refill, no joint deformities, effusion, or inflammation, no edema, no skin discoloration, no clubbing, no cyanosis  NEURO: alert & oriented x 3 with fluent speech, no focal motor/sensory deficits, gait normal   LABORATORY DATA: CBC    Component Value Date/Time   WBC 5.0 08/04/2014 1101   RBC 4.55 08/04/2014 1101   RBC 3.00* 06/16/2014 1705   HGB 11.7* 08/04/2014 1101   HCT 35.7* 08/04/2014 1101   PLT 246 08/04/2014 1101   MCV 78.5 08/04/2014 1101   MCH 25.7* 08/04/2014 1101   MCHC 32.8 08/04/2014 1101   RDW 20.2* 08/04/2014 1101   LYMPHSABS 1.5 08/04/2014 1101   MONOABS 0.5 08/04/2014 1101   EOSABS 0.2 08/04/2014 1101   BASOSABS 0.0 08/04/2014 1101      Chemistry      Component Value Date/Time   NA 142 08/04/2014 1101   K 4.1 08/04/2014 1101   CL 104 08/04/2014 1101   CO2 28 08/04/2014 1101   BUN 16 08/04/2014 1101   CREATININE 1.02* 08/04/2014 1101      Component Value Date/Time   CALCIUM 9.6 08/04/2014 1101   ALKPHOS 80 08/04/2014 1101   AST 37 08/04/2014 1101   ALT 38 08/04/2014 1101   BILITOT 0.5 08/04/2014 1101       RADIOGRAPHIC STUDIES:  Mm Digital Diagnostic Unilat R  08/02/2014   CLINICAL DATA:  Patient recalled from screening  for right breast mass.  EXAM: DIGITAL DIAGNOSTIC RIGHT MAMMOGRAM WITH CAD  ULTRASOUND RIGHT BREAST  COMPARISON:  Previous exam(s).  ACR Breast Density Category b: There are scattered areas of fibroglandular density.  FINDINGS: Within lower outer right breast anterior depth there is a 6 mm oval circumscribed mass further evaluated with tomosynthesis images.  Mammographic images were processed with CAD.  On physical exam, I palpate no discrete mass within the lower outer right breast.  Targeted ultrasound is performed, showing  a 6 x 3 x 6 mm oval circumscribed anechoic mass within the right breast 9 o'clock position 4 cm from the nipple compatible with a simple cyst. This corresponds with mammographic finding.  IMPRESSION: Right breast simple cyst.  RECOMMENDATION: Return to annual screening mammography.  I have discussed the findings and recommendations with the patient. Results were also provided in writing at the conclusion of the visit. If applicable, a reminder letter will be sent to the patient regarding the next appointment.  BI-RADS CATEGORY  2: Benign.   Electronically Signed   By: Lovey Newcomer M.D.   On: 08/02/2014 10:26   Mm Digital Screening Bilateral  07/13/2014   CLINICAL DATA:  Screening.  EXAM: DIGITAL SCREENING BILATERAL MAMMOGRAM WITH CAD  COMPARISON:  None.  ACR Breast Density Category b: There are scattered areas of fibroglandular density.  FINDINGS: In the right breast, a possible mass warrants further evaluation. In the left breast, no findings suspicious for malignancy. Images were processed with CAD.  IMPRESSION: Further evaluation is suggested for possible mass in the right breast.  RECOMMENDATION: Diagnostic mammogram and possibly ultrasound of the right breast. (Code:[id number])  The patient will be contacted regarding the findings, and additional imaging will be scheduled.  BI-RADS CATEGORY  0: Incomplete. Need additional imaging evaluation and/or prior mammograms for comparison.    Electronically Signed   By: Everlean Alstrom M.D.   On: 07/13/2014 16:11   US Breast Ltd Uni Right Inc Axilla  08/02/2014   CLINICAL DATA:  Patient recalled from screening for right breast mass.  EXAM: DIGITAL DIAGNOSTIC RIGHT MAMMOGRAM WITH CAD  ULTRASOUND RIGHT BREAST  COMPARISON:  Previous exam(s).  ACR Breast Density Category b: There are scattered areas of fibroglandular density.  FINDINGS: Within lower outer right breast anterior depth there is a 6 mm oval circumscribed mass further evaluated with tomosynthesis images.  Mammographic images were processed with CAD.  On physical exam, I palpate no discrete mass within the lower outer right breast.  Targeted ultrasound is performed, showing a 6 x 3 x 6 mm oval circumscribed anechoic mass within the right breast 9 o'clock position 4 cm from the nipple compatible with a simple cyst. This corresponds with mammographic finding.  IMPRESSION: Right breast simple cyst.  RECOMMENDATION: Return to annual screening mammography.  I have discussed the findings and recommendations with the patient. Results were also provided in writing at the conclusion of the visit. If applicable, a reminder letter will be sent to the patient regarding the next appointment.  BI-RADS CATEGORY  2: Benign.   Electronically Signed   By: Lovey Newcomer M.D.   On: 08/02/2014 10:26     ASSESSMENT AND PLAN:  Severe iron deficiency anemia EGD 06/13/2014 with gastritis and duodenitis in setting of anticoagulation with fresh blood in stomach. History of suicide attempt Right breast cyst Chronic systolic dysfunction/ischemic cardiomyopathy/atrial fibrillation   THERAPY PLAN:  I reassured the patient that her right breast mass with a benign cyst. There is currently no indication for surgery. It does not bother her. I have advised her that certainly if she experiences any change in size or experiences pain to let us know. Plan will be for repeat mammography in 1 year.   She has undergone a  thorough GI evaluation with details noted above. Unfortunately she will be on long-term anticoagulation and I have advised her we will need to monitor iron levels and hemoglobin closely. Pending the results of iron studies today if she needs additional iron replacement  we will arrange that. Her B12 level was borderline low and I have recommended a B12 injection 1 and then sublingual B-12 moving forward. We will recheck a B12 level in the next several months.   All questions were answered. The patient knows to call the clinic with any problems, questions or concerns. We can certainly see the patient much sooner if necessary.   This document serves as a record of services personally performed by Ancil Linsey, MD. It was created on her behalf by Janace Hoard, a trained medical scribe. The creation of this record is based on the scribe's personal observations and the provider's statements to them. This document has been checked and approved by the attending provider.  I have reviewed the above documentation for accuracy and completeness, and I agree with the above.  This note is electronically signed.  Kelby Fam. Whitney Muse, MD

## 2014-08-04 NOTE — Patient Instructions (Signed)
[place] at [facility] Discharge Instructions  RECOMMENDATIONS MADE BY THE CONSULTANT AND ANY TEST RESULTS WILL BE SENT TO YOUR REFERRING PHYSICIAN.  Exam and discussion by Dr. Whitney Muse. Will check some labs today and if you need any iron we will call you. Will give you B12 injection today and want you to take over the counter Sublingual Vitamin B12 every day. Dr. Whitney Muse will talk with your cardiologist about your pacemaker insertion.  Follow-up in 2 months.    Thank you for choosing [place] at [facility] to provide your oncology and hematology care.  To afford each patient quality time with our provider, please arrive at least 15 minutes before your scheduled appointment time.    You need to re-schedule your appointment should you arrive 10 or more minutes late.  We strive to give you quality time with our providers, and arriving late affects you and other patients whose appointments are after yours.  Also, if you no show three or more times for appointments you may be dismissed from the clinic at the providers discretion.     Again, thank you for choosing [facility].  Our hope is that these requests will decrease the amount of time that you wait before being seen by our physicians.       _____________________________________________________________  Should you have questions after your visit to [facility], please contact our office at [phone] between the hours of 8:30 a.m. and 4:30 p.m.  Voicemails left after 4:30 p.m. will not be returned until the following business day.  For prescription refill requests, have your pharmacy contact our office.

## 2014-08-20 NOTE — Progress Notes (Signed)
Electrophysiology Office Note Date: 08/20/2014  ID:  [name], DOB Aug 30, 1955, MRN [id number]  PCP: [name], MD Primary Cardiologist: Aundra Dubin Electrophysiologist: Allred  CC: to discuss CRTD implant  [name] is a 59 y.o. female seen today for Dr Rayann Heman.  She was originally evaluated by EP September of 2015 but was felt to be a poor candidate for ICD implantation at that time due to recent suicide attempt.  She stopped drinking ETOH and was seen again in April of this year for consideration of CRTD.  Pre-procedure labs demonstrated significant anemia and her procedure was cancelled until work up could be completed.  She has been seen by GI and heme/onc.  GI workup did not identify definite source of anemia. She has been placed on iron supplementation and undergone transfusion for iron deficiency anemia with significant improvement.  She presents today for further discussion of ICD implant.  Since last being seen in our clinic, the patient reports doing very well.  She denies chest pain, palpitations, dyspnea, PND, orthopnea, nausea, vomiting, dizziness, syncope, edema, weight gain, or early satiety.  Her HF symptoms are class 2a  Past Medical History  Diagnosis Date  . Diverticulosis   . Pancreatitis 1980, 2015    in the setting of ETOH  . chronic systolic heart failure     a. ECHO (05/08/13): EF 20-25%, diff HK with akinesis of basal inferior wall, grade 1 DD, trivial MR, trivial TR b) RHC (05/06/13): RA 7, RV 30/2, PA 31/19 (24), PCWP 10, Fick CO/CI: 2.9/1.74, Thermo CO/Ci: 2.4/1.4, PA sat 53%  . Ischemic cardiomyopathy   . CAD (coronary artery disease)     a. LHC (04/2013): Chronically occluded LAD, moderate dz in large OM1 and severe dz and small posterior lateral vessel    . Persistent atrial fibrillation 2015  . Suicidal overdose 10/03/13    Overdoes on Beta Blockers & Xarelto [facility])  . Anxiety 2015   . Psoriasis 1968  . GERD (gastroesophageal reflux disease)  2015   . Hyperlipidemia 2015  . Hypertension 2015  . ETOH abuse 1981    started abusing alcohol at age 37, quit   . Stroke 2011    left side weakness, minimal  . Hypothyroidism   . PONV (postoperative nausea and vomiting)   . Mass of right breast on mammogram 07/14/2014   Past Surgical History  Procedure Laterality Date  . Colostomy takedown  2007  . Exploratory laparotomy with colon resection, colostomy  2007  . Esophagogastroduodenoscopy (egd) with propofol N/A 11/22/2013    Dr. Oneida Alar: gastritis and duodenitis, negative H.pylori  . Esophageal biopsy N/A 11/22/2013    Procedure: GASTRIC BIOPSIES;  Surgeon: Danie Binder, MD;  Location: [facility];  Service: Endoscopy;  Laterality: N/A;  . Left and right heart catheterization with coronary angiogram N/A 05/10/2013    Procedure: LEFT AND RIGHT HEART CATHETERIZATION WITH CORONARY ANGIOGRAM;  Surgeon: [name], MD;  Location: [facility] CATH LAB;  Service: Cardiovascular;  Laterality: N/A;  . Left heart catheterization with coronary angiogram N/A 09/03/2013    Procedure: LEFT HEART CATHETERIZATION WITH CORONARY ANGIOGRAM;  Surgeon: Sinclair Grooms, MD;  Location: [facility] CATH LAB;  Service: Cardiovascular;  Laterality: N/A;  . Colonoscopy with propofol N/A 03/29/2014    SLF: 1. No definite source for anemia identified 2. 8 colon polyps removed 3. Severe diverticulosis noted the transverse colon  and right colon 4. Small internal hemorrohids.   . Polypectomy N/A 03/29/2014  Procedure: POLYPECTOMY;  Surgeon: Danie Binder, MD;  Location: [facility];  Service: Endoscopy;  Laterality: N/A;  ascending colon, sigmoid colon x4, rectal x3  . Left and right heart catheterization with coronary angiogram N/A 04/14/2014    Procedure: LEFT AND RIGHT HEART CATHETERIZATION WITH CORONARY ANGIOGRAM;  Surgeon: Larey Dresser, MD;  Location: [facility] CATH LAB;  Service: Cardiovascular;  Laterality: N/A;  . Agile capsule N/A 06/02/2014    Procedure: AGILE CAPSULE;  Surgeon: Danie Binder, MD;  Location: [facility] ENDO SUITE;  Service: Endoscopy;  Laterality: N/A;  0800  . Givens capsule study N/A 06/10/2014    Procedure: GIVENS CAPSULE STUDY;  Surgeon: Danie Binder, MD;  Location: [facility] END[facility] SUITE;  Service: Endoscopy;  Laterality: N/A;  0700    Current Outpatient Prescriptions  Medication Sig Dispense Refill  . acetaminophen (TYLENOL) 325 MG tablet Take 650 mg by mouth every 6 (six) hours as needed for fever or headache.    Marland Kitchen apixaban (ELIQUIS) 5 MG TABS tablet Take 1 tablet (5 mg total) by mouth 2 (two) times daily. 60 tablet 3  . atorvastatin (LIPITOR) 80 MG tablet Take 1 tablet (80 mg total) by mouth daily. 30 tablet 6  . carvedilol (COREG) 6.25 MG tablet Take 1 tablet (6.25 mg total) by mouth 2 (two) times daily with a meal. Hold for heart rate less than 60 60 tablet 3  . furosemide (LASIX) 20 MG tablet Take 1 tablet (20 mg total) by mouth every other day. 15 tablet 6  . levothyroxine (SYNTHROID, LEVOTHROID) 25 MCG tablet Take 0.125 mcg by mouth daily before breakfast.    . lisinopril (PRINIVIL,ZESTRIL) 5 MG tablet Take 1 tablet (5 mg total) by mouth daily. 90 tablet 3  . LORazepam (ATIVAN) 0.5 MG tablet Take 1 tablet (0.5 mg total) by mouth 2 (two) times daily. 60 tablet 2  . nitroGLYCERIN (NITROSTAT) 0.3 MG SL tablet Place 0.3 mg under the tongue every 5 (five) minutes as needed for chest pain (MAX 3 TABLETS).     . ondansetron (ZOFRAN) 4 MG tablet Take 4 mg by mouth every 8 (eight) hours as needed for nausea or vomiting.    . pantoprazole (PROTONIX) 40 MG tablet TAKE 1 TABLET BY MOUTH TWICE DAILY BEFORE A MEAL. 60 tablet 5  . sertraline (ZOLOFT) 50 MG tablet Take 1 tablet (50 mg total) by mouth daily. 90 tablet 1  . spironolactone (ALDACTONE) 25 MG tablet Take 0.5 tablets (12.5 mg total) by mouth daily. 15 tablet 3   No current facility-administered medications for this visit.    Allergies:   Morphine and related and Penicillins   Social History: History   Social  History  . Marital Status: Married    Spouse Name: N/A  . Number of Children: 2  . Years of Education: 10   Occupational History  . Unemployed     Social History Main Topics  . Smoking status: Current Some Day Smoker -- 0.25 packs/day for 47 years    Types: Cigarettes  . Smokeless tobacco: Never Used     Comment: she smokes occasionally, trying to quit  . Alcohol Use: 0.0 oz/week    0 Standard drinks or equivalent per week     Comment: last drink in 0/2542, former alcoholic  . Drug Use: No  . Sexual Activity: No   Other Topics Concern  . Not on file   Social History Narrative   Lived previously with her spouse in an [place].  Son and daughter   Daughter in [place]   Son in [place]   Two grandchildren.       Presently in a group home [place], moved in 10/13/13.           Family History: Family History  Problem Relation Age of Onset  . CAD Father 17    MI  . CAD Mother 53    MI  . Alcohol abuse Mother   . CAD Sister 16    Stent  . Diabetes Sister   . CAD Brother 44    MI  . Cancer Neg Hx   . Colon cancer Mother     in her 74s    Review of Systems: All other systems reviewed and are otherwise negative except as noted above.   Physical Exam: VS:  LMP 01/22/1988 (Approximate) , BMI There is no weight on file to calculate BMI. Wt Readings from Last 3 Encounters:  08/04/14 118 lb 4.8 oz (53.661 kg)  07/14/14 116 lb 6.4 oz (52.799 kg)  06/16/14 118 lb 8 oz (53.751 kg)    GEN- The patient is well appearing, alert and oriented x 3 today.   HEENT: normocephalic, atraumatic; sclera clear, conjunctiva pink; hearing intact; oropharynx clear; neck supple, no JVP Lymph- no cervical lymphadenopathy Lungs- Clear to ausculation bilaterally, normal work of breathing.  No wheezes, rales, rhonchi Heart- Regular rate and rhythm, no murmurs, rubs or gallops, PMI not laterally displaced GI- soft, non-tender, non-distended, bowel sounds present, no  hepatosplenomegaly Extremities- no clubbing, cyanosis, or edema; DP/PT/radial pulses 2+ bilaterally MS- no significant deformity or atrophy Skin- warm and dry, no rash or lesion  Psych- euthymic mood, full affect Neuro- strength and sensation are intact   EKG:  EKG is ordered today. The ekg ordered today shows sinus rhythm, rate 62, PVC's, QRS 164msec  Recent Labs: 09/02/2013: Pro B Natriuretic peptide (BNP) 1566.0* 10/08/2013: Magnesium 2.1 04/09/2014: B Natriuretic Peptide 1385.0*; TSH 4.900* 08/04/2014: ALT 38; BUN 16; Creatinine, Ser 1.02*; Hemoglobin 11.7*; Platelets 246; Potassium 4.1; Sodium 142    Other studies Reviewed: Additional studies/ records that were reviewed today include: Dr Jackalyn Lombard office notes, GI and heme notes  Assessment and Plan: 1.  Chronic systolic dysfunction/ICM Euvolemic on exam She has been on guideline directed therapy without improvement in LVEF.  ICD implant previously discussed with the patient by Dr Rayann Heman but procedure was postponed 2/2 iron deficiency anemia.  She has since been seen by GI and heme/onc with iron supplementation and transfusions and improvement in labs.  Risks, benefits of ICD implantation again reviewed with the patient who wishes to proceed with procedure. Will schedule with Dr Rayann Heman at the next available time.  QRS was previously 164msec with IVCD, now 159msec on today's EKG.  Discussed with Dr Rayann Heman, will plan single chamber ICD implantation.   2.  Paroxysmal atrial fibrillation Maintaining SR Continue Eliquis for CHADS2VASC of at least 6 Hold Eliquis for 24 hours prior to device implant  3.  Iron deficiency anemia As above Continue Fe supplementation  4.  HTN Stable No change required today  5.  ETOH abuse Continued cessation advised  6.  Tobacco abuse Cessation encouraged She is down to 2-3 cigarettes/day   Current medicines are reviewed at length with the patient today.   The patient does not have concerns  regarding her medicines.  The following changes were made today:  none  Labs/ tests ordered today include: pre-procedure labs    Disposition:  Follow up with Dr Rayann Heman after ICD implant; follow up with Dr Aundra Dubin as scheduled.    Signed, Chanetta Marshall, NP 08/20/2014 5:22 PM   [place] [address] [phone] (office) [phone] (fax)

## 2014-08-22 ENCOUNTER — Ambulatory Visit (INDEPENDENT_AMBULATORY_CARE_PROVIDER_SITE_OTHER): Payer: Medicaid Other | Admitting: Nurse Practitioner

## 2014-08-22 ENCOUNTER — Encounter: Payer: Self-pay | Admitting: Nurse Practitioner

## 2014-08-22 ENCOUNTER — Encounter: Payer: Self-pay | Admitting: *Deleted

## 2014-08-22 VITALS — BP 120/58 | HR 62 | Ht 62.5 in | Wt 123.6 lb

## 2014-08-22 DIAGNOSIS — I10 Essential (primary) hypertension: Secondary | ICD-10-CM

## 2014-08-22 DIAGNOSIS — I519 Heart disease, unspecified: Secondary | ICD-10-CM | POA: Diagnosis not present

## 2014-08-22 DIAGNOSIS — I480 Paroxysmal atrial fibrillation: Secondary | ICD-10-CM

## 2014-08-22 DIAGNOSIS — I255 Ischemic cardiomyopathy: Secondary | ICD-10-CM | POA: Diagnosis not present

## 2014-08-22 NOTE — Patient Instructions (Signed)
Medication Instructions:   Your physician recommends that you continue on your current medications as directed. Please refer to the Current Medication list given to you today.   Labwork:  RETURN FOR LAB WORK ON 8/19 /16 FOR CBC BMET AND PT/INR    Testing/Procedures:  SEE LETTER     Follow-Up:   10 TO 14 DAYS AFTER 09/13/14 FOR WOUND CHECK    Any Other Special Instructions Will Be Listed Below (If Applicable).

## 2014-08-23 ENCOUNTER — Telehealth: Payer: Self-pay | Admitting: *Deleted

## 2014-08-30 ENCOUNTER — Ambulatory Visit (INDEPENDENT_AMBULATORY_CARE_PROVIDER_SITE_OTHER): Payer: Medicaid Other | Admitting: Nurse Practitioner

## 2014-08-30 ENCOUNTER — Other Ambulatory Visit (INDEPENDENT_AMBULATORY_CARE_PROVIDER_SITE_OTHER): Payer: Medicaid Other | Admitting: *Deleted

## 2014-08-30 ENCOUNTER — Encounter: Payer: Self-pay | Admitting: Nurse Practitioner

## 2014-08-30 VITALS — BP 110/63 | HR 77 | Temp 97.3°F | Ht 63.0 in | Wt 123.8 lb

## 2014-08-30 DIAGNOSIS — I519 Heart disease, unspecified: Secondary | ICD-10-CM | POA: Diagnosis not present

## 2014-08-30 DIAGNOSIS — K2970 Gastritis, unspecified, without bleeding: Secondary | ICD-10-CM

## 2014-08-30 DIAGNOSIS — D500 Iron deficiency anemia secondary to blood loss (chronic): Secondary | ICD-10-CM

## 2014-08-30 DIAGNOSIS — K2990 Gastroduodenitis, unspecified, without bleeding: Secondary | ICD-10-CM | POA: Diagnosis not present

## 2014-08-30 LAB — CBC
HCT: 35.5 % — ABNORMAL LOW (ref 36.0–46.0)
HEMOGLOBIN: 11.9 g/dL — AB (ref 12.0–15.0)
MCHC: 33.5 g/dL (ref 30.0–36.0)
MCV: 80.7 fl (ref 78.0–100.0)
Platelets: 227 10*3/uL (ref 150.0–400.0)
RBC: 4.4 Mil/uL (ref 3.87–5.11)
RDW: 24.5 % — ABNORMAL HIGH (ref 11.5–15.5)
WBC: 5.5 10*3/uL (ref 4.0–10.5)

## 2014-08-30 LAB — BASIC METABOLIC PANEL
BUN: 18 mg/dL (ref 6–23)
CO2: 31 meq/L (ref 19–32)
Calcium: 9.6 mg/dL (ref 8.4–10.5)
Chloride: 106 mEq/L (ref 96–112)
Creatinine, Ser: 1.12 mg/dL (ref 0.40–1.20)
GFR: 52.82 mL/min — ABNORMAL LOW (ref >60.00)
Glucose, Bld: 77 mg/dL (ref 70–99)
Potassium: 4.7 mEq/L (ref 3.5–5.1)
SODIUM: 141 meq/L (ref 135–145)

## 2014-08-30 LAB — PROTIME-INR
INR: 1.1 ratio — ABNORMAL HIGH (ref 0.8–1.0)
Prothrombin Time: 12.6 s (ref 9.6–13.1)

## 2014-08-30 NOTE — Assessment & Plan Note (Signed)
Patient with iron deficiency anemia and demonstrated gastritis/duodenitis currently on multiple anticoagulation for A. fib. Her hemoglobin got as low as 6.9 but is stable and 11.7-11.9 range after blood transfusions and iron transfusions. Advised her to continue taking her PPI, avoid ASA/NSAID medications, notify us of any noted GI bleeding, and return for routine follow-up in 6 months.

## 2014-08-30 NOTE — Assessment & Plan Note (Signed)
Patient with iron deficiency anemia which at one point was severe with a hemoglobin is 6.9. However after receiving 2 units of packed red blood cells and a couple IV iron infusions her hemoglobin has been holding strong at 11.7-11.9 over the past 3 months. She is actively being managed by hematology for this. No further GI workup necessary as sources likely been determined to be chronic gastritis/duodenitis. See gastritis/duodenitis plan for recommendations. We'll have her return for 6 month routine follow-up.

## 2014-08-30 NOTE — Patient Instructions (Signed)
1. Keep taking her Protonix medication. 2. Avoid taking aspirin unless necessary for your heart condition. 3. Avoid taking NSAID medications. These include, but are not limited to, Naproxen, Naprosyn, ibuprofen, Aleve, Advil, and others. 4. Notify us if he notices any bleeding in her stools. 5. Return for follow-up in 6 months.

## 2014-08-30 NOTE — Progress Notes (Signed)
cc'ed to pcp °

## 2014-08-30 NOTE — Progress Notes (Signed)
Referring Provider: Boykin Nearing, MD Primary Care Physician:  [name], MD Primary GI:  Dr. Oneida Alar  Chief Complaint  Patient presents with  . Anemia    HPI:   59 year old female presents for follow-up on anemia on anticoagulation. She has had a complete GI workup including colonoscopy and Givens capsule. Colonoscopy was completed on 03/29/2014 which found 8 colon polyps status post removal, severe diverticulosis noted transverse colon and right colon, small internal hemorrhoids. Recommend next colonoscopy in 1-3 years. Pathology showed all polyps were tubular adenoma or hyperplastic. Givens capsule showed gastritis/duodenitis and likely source of chronic blood loss due to Xarelto and Eliquis. Recommended referral to hematology for further evaluation, avoid ASA/NSAIDs, continue Protonix. She was seen by hematology initially on 06/16/2014 and was arranged for iron transfusion. Her hemoglobin came back 6.9 and she was subsequently given a transfusion of packed red blood cells x 2 units. She returned 06/17/2014 for feraheme infusion which was repeated on 06/24/2014. Follow-up office visit on 07/14/2014 showed hemoglobin improved to 11.9. Repeat CBC on 08/04/2014 shows hemoglobin stable at 11.7.  Today she states she's feeling pretty good overall. Deneis abdominal pain, N/V, hematochezia, melena. Denies chest pain, dyspnea, dizziness, lightheadedness, syncope, near syncope. She is generally asymptomatic. Denies any excessive bleeding of any source or excessive bruising. Denies any other upper or lower GI symptoms. States she's excited about the possibility of finally being able to get a pacemaker with Dr. Joylene Grapes.   Past Medical History  Diagnosis Date  . Diverticulosis   . Pancreatitis 1980, 2015    in the setting of ETOH  . chronic systolic heart failure     a. ECHO (05/08/13): EF 20-25%, diff HK with akinesis of basal inferior wall, grade 1 DD, trivial MR, trivial TR b) RHC  (05/06/13): RA 7, RV 30/2, PA 31/19 (24), PCWP 10, Fick CO/CI: 2.9/1.74, Thermo CO/Ci: 2.4/1.4, PA sat 53%  . Ischemic cardiomyopathy   . CAD (coronary artery disease)     a. LHC (04/2013): Chronically occluded LAD, moderate dz in large OM1 and severe dz and small posterior lateral vessel    . Persistent atrial fibrillation 2015  . Suicidal overdose 10/03/13    Overdoes on Beta Blockers & Xarelto [facility] ED)  . Anxiety 2015   . Psoriasis 1968  . GERD (gastroesophageal reflux disease) 2015   . Hyperlipidemia 2015  . Hypertension 2015  . ETOH abuse 1981    started abusing alcohol at age 46, quit   . Stroke 2011    left side weakness, minimal  . Hypothyroidism   . PONV (postoperative nausea and vomiting)   . Mass of right breast on mammogram 07/14/2014    Past Surgical History  Procedure Laterality Date  . Colostomy takedown  2007  . Exploratory laparotomy with colon resection, colostomy  2007  . Esophagogastroduodenoscopy (egd) with propofol N/A 11/22/2013    Dr. Oneida Alar: gastritis and duodenitis, negative H.pylori  . Esophageal biopsy N/A 11/22/2013    Procedure: GASTRIC BIOPSIES;  Surgeon: Danie Binder, MD;  Location: [facility];  Service: Endoscopy;  Laterality: N/A;  . Left and right heart catheterization with coronary angiogram N/A 05/10/2013    Procedure: LEFT AND RIGHT HEART CATHETERIZATION WITH CORONARY ANGIOGRAM;  Surgeon: [name], MD;  Location: [facility] CATH LAB;  Service: Cardiovascular;  Laterality: N/A;  . Left heart catheterization with coronary angiogram N/A 09/03/2013    Procedure: LEFT HEART CATHETERIZATION WITH CORONARY ANGIOGRAM;  Surgeon: Sinclair Grooms, MD;  Location:  [place] CATH LAB;  Service: Cardiovascular;  Laterality: N/A;  . Colonoscopy with propofol N/A 03/29/2014    SLF: 1. No definite source for anemia identified 2. 8 colon polyps removed 3. Severe diverticulosis noted the transverse colon  and right colon 4. Small internal hemorrohids.   . Polypectomy N/A 03/29/2014      Procedure: POLYPECTOMY;  Surgeon: Danie Binder, MD;  Location: [facility];  Service: Endoscopy;  Laterality: N/A;  ascending colon, sigmoid colon x4, rectal x3  . Left and right heart catheterization with coronary angiogram N/A 04/14/2014    Procedure: LEFT AND RIGHT HEART CATHETERIZATION WITH CORONARY ANGIOGRAM;  Surgeon: Larey Dresser, MD;  Location: [facility] CATH LAB;  Service: Cardiovascular;  Laterality: N/A;  . Agile capsule N/A 06/02/2014    Procedure: AGILE CAPSULE;  Surgeon: Danie Binder, MD;  Location: [facility];  Service: Endoscopy;  Laterality: N/A;  0800  . Givens capsule study N/A 06/10/2014    Procedure: GIVENS CAPSULE STUDY;  Surgeon: Danie Binder, MD;  Location: [facility];  Service: Endoscopy;  Laterality: N/A;  0700    Current Outpatient Prescriptions  Medication Sig Dispense Refill  . acetaminophen (TYLENOL) 325 MG tablet Take 650 mg by mouth every 6 (six) hours as needed for fever or headache.    Marland Kitchen apixaban (ELIQUIS) 5 MG TABS tablet Take 1 tablet (5 mg total) by mouth 2 (two) times daily. 60 tablet 3  . atorvastatin (LIPITOR) 80 MG tablet Take 1 tablet (80 mg total) by mouth daily. 30 tablet 6  . carvedilol (COREG) 6.25 MG tablet Take 1 tablet (6.25 mg total) by mouth 2 (two) times daily with a meal. Hold for heart rate less than 60 60 tablet 3  . furosemide (LASIX) 20 MG tablet Take 1 tablet (20 mg total) by mouth every other day. 15 tablet 6  . levothyroxine (SYNTHROID, LEVOTHROID) 25 MCG tablet Take 0.125 mcg by mouth daily before breakfast.    . lisinopril (PRINIVIL,ZESTRIL) 5 MG tablet Take 1 tablet (5 mg total) by mouth daily. 90 tablet 3  . LORazepam (ATIVAN) 0.5 MG tablet Take 1 tablet (0.5 mg total) by mouth 2 (two) times daily. 60 tablet 2  . ondansetron (ZOFRAN) 4 MG tablet Take 4 mg by mouth every 8 (eight) hours as needed for nausea or vomiting.    . pantoprazole (PROTONIX) 40 MG tablet TAKE 1 TABLET BY MOUTH TWICE DAILY BEFORE A MEAL. 60 tablet 5  .  sertraline (ZOLOFT) 50 MG tablet Take 1 tablet (50 mg total) by mouth daily. 90 tablet 1  . spironolactone (ALDACTONE) 25 MG tablet Take 0.5 tablets (12.5 mg total) by mouth daily. (Patient taking differently: Take 25 mg by mouth daily. One tablet every other day) 15 tablet 3  . nitroGLYCERIN (NITROSTAT) 0.3 MG SL tablet Place 0.3 mg under the tongue every 5 (five) minutes as needed for chest pain (MAX 3 TABLETS).      No current facility-administered medications for this visit.    Allergies as of 08/30/2014 - Review Complete 08/30/2014  Allergen Reaction Noted  . Morphine and related Other (See Comments) 09/24/2013  . Penicillins Anaphylaxis and Rash 05/06/2013    Family History  Problem Relation Age of Onset  . CAD Father 65    MI  . CAD Mother 51    MI  . Alcohol abuse Mother   . CAD Sister 47    Stent  . Diabetes Sister   . CAD Brother 77  MI  . Cancer Neg Hx   . Colon cancer Mother     in her 15s    History   Social History  . Marital Status: Married    Spouse Name: N/A  . Number of Children: 2  . Years of Education: 10   Occupational History  . Unemployed     Social History Main Topics  . Smoking status: Current Some Day Smoker -- 0.25 packs/day for 47 years    Types: Cigarettes  . Smokeless tobacco: Never Used     Comment: 4 cigarettes daily  . Alcohol Use: 0.0 oz/week    0 Standard drinks or equivalent per week     Comment: last drink in 01/1939, former alcoholic  . Drug Use: No  . Sexual Activity: No   Other Topics Concern  . None   Social History Narrative   Lived previously with her spouse in an [place].     Son and daughter   Daughter in [place]   Son in [place]   Two grandchildren.       Presently in a group home [place], moved in 10/13/13.           Review of Systems: 10 point ROS negative excep as per HPI.   Physical Exam: BP 110/63 mmHg  Pulse 77  Temp(Src) 97.3 F (36.3 C) (Oral)  Ht 5\' 3"  (1.6 m)  Wt 123 lb 12.8  oz (56.155 kg)  BMI 21.94 kg/m2  LMP 01/22/1988 (Approximate) General:   Alert and oriented. Pleasant and cooperative. Well-nourished and well-developed.  Head:  Normocephalic and atraumatic. Eyes:  Without icterus, sclera clear and conjunctiva pink. No pallor noted. Ears:  Normal auditory acuity. Cardiovascular:  S1, S2 present without murmurs appreciated. Extremities without clubbing or edema. Respiratory:  Clear to auscultation bilaterally. No wheezes, rales, or rhonchi. No distress.  Gastrointestinal:  +BS, soft, non-tender and non-distended. No HSM noted. No guarding or rebound. No masses appreciated.  Rectal:  Deferred  Neurologic:  Alert and oriented x4;  grossly normal neurologically. Psych:  Alert and cooperative. Normal mood and affect. Heme/Lymph/Immune: No excessive bruising noted.    08/30/2014 1:41 PM

## 2014-09-05 ENCOUNTER — Ambulatory Visit (HOSPITAL_COMMUNITY): Payer: Self-pay

## 2014-09-13 ENCOUNTER — Ambulatory Visit (HOSPITAL_COMMUNITY)
Admission: RE | Admit: 2014-09-13 | Discharge: 2014-09-14 | Disposition: A | Discharge status: Home / Self Care | Payer: Medicaid Other | Source: Ambulatory Visit | Attending: Internal Medicine | Admitting: Internal Medicine

## 2014-09-13 ENCOUNTER — Encounter (HOSPITAL_COMMUNITY)
Admission: RE | Disposition: A | Discharge status: Home / Self Care | Payer: Self-pay | Source: Ambulatory Visit | Attending: Internal Medicine

## 2014-09-13 ENCOUNTER — Encounter (HOSPITAL_COMMUNITY): Payer: Self-pay | Admitting: Internal Medicine

## 2014-09-13 DIAGNOSIS — I10 Essential (primary) hypertension: Secondary | ICD-10-CM | POA: Insufficient documentation

## 2014-09-13 DIAGNOSIS — I255 Ischemic cardiomyopathy: Secondary | ICD-10-CM | POA: Diagnosis present

## 2014-09-13 DIAGNOSIS — D509 Iron deficiency anemia, unspecified: Secondary | ICD-10-CM | POA: Diagnosis not present

## 2014-09-13 DIAGNOSIS — I480 Paroxysmal atrial fibrillation: Secondary | ICD-10-CM | POA: Diagnosis not present

## 2014-09-13 DIAGNOSIS — Z7901 Long term (current) use of anticoagulants: Secondary | ICD-10-CM | POA: Diagnosis not present

## 2014-09-13 DIAGNOSIS — Z79899 Other long term (current) drug therapy: Secondary | ICD-10-CM | POA: Diagnosis not present

## 2014-09-13 DIAGNOSIS — E039 Hypothyroidism, unspecified: Secondary | ICD-10-CM | POA: Insufficient documentation

## 2014-09-13 DIAGNOSIS — I5023 Acute on chronic systolic (congestive) heart failure: Secondary | ICD-10-CM | POA: Diagnosis present

## 2014-09-13 DIAGNOSIS — K219 Gastro-esophageal reflux disease without esophagitis: Secondary | ICD-10-CM | POA: Insufficient documentation

## 2014-09-13 DIAGNOSIS — I481 Persistent atrial fibrillation: Secondary | ICD-10-CM | POA: Diagnosis not present

## 2014-09-13 DIAGNOSIS — Z95810 Presence of automatic (implantable) cardiac defibrillator: Secondary | ICD-10-CM | POA: Diagnosis not present

## 2014-09-13 DIAGNOSIS — F419 Anxiety disorder, unspecified: Secondary | ICD-10-CM | POA: Insufficient documentation

## 2014-09-13 DIAGNOSIS — I5022 Chronic systolic (congestive) heart failure: Secondary | ICD-10-CM | POA: Diagnosis present

## 2014-09-13 DIAGNOSIS — I2510 Atherosclerotic heart disease of native coronary artery without angina pectoris: Secondary | ICD-10-CM | POA: Insufficient documentation

## 2014-09-13 DIAGNOSIS — F17210 Nicotine dependence, cigarettes, uncomplicated: Secondary | ICD-10-CM | POA: Insufficient documentation

## 2014-09-13 DIAGNOSIS — L409 Psoriasis, unspecified: Secondary | ICD-10-CM | POA: Insufficient documentation

## 2014-09-13 DIAGNOSIS — Z8673 Personal history of transient ischemic attack (TIA), and cerebral infarction without residual deficits: Secondary | ICD-10-CM | POA: Insufficient documentation

## 2014-09-13 DIAGNOSIS — E785 Hyperlipidemia, unspecified: Secondary | ICD-10-CM | POA: Insufficient documentation

## 2014-09-13 DIAGNOSIS — I2582 Chronic total occlusion of coronary artery: Secondary | ICD-10-CM | POA: Diagnosis not present

## 2014-09-13 DIAGNOSIS — Z959 Presence of cardiac and vascular implant and graft, unspecified: Secondary | ICD-10-CM

## 2014-09-13 HISTORY — DX: Major depressive disorder, single episode, unspecified: F32.9

## 2014-09-13 HISTORY — DX: Anemia, unspecified: D64.9

## 2014-09-13 HISTORY — DX: Depression, unspecified: F32.A

## 2014-09-13 HISTORY — DX: Personal history of other medical treatment: Z92.89

## 2014-09-13 HISTORY — PX: EP IMPLANTABLE DEVICE: SHX172B

## 2014-09-13 LAB — CBC
HCT: 30.9 % — ABNORMAL LOW (ref 36.0–46.0)
Hemoglobin: 10.4 g/dL — ABNORMAL LOW (ref 12.0–15.0)
MCH: 27.9 pg (ref 26.0–34.0)
MCHC: 33.7 g/dL (ref 30.0–36.0)
MCV: 82.8 fL (ref 78.0–100.0)
Platelets: 176 K/uL (ref 150–400)
RBC: 3.73 MIL/uL — ABNORMAL LOW (ref 3.87–5.11)
RDW: 20 % — ABNORMAL HIGH (ref 11.5–15.5)
WBC: 5.7 K/uL (ref 4.0–10.5)

## 2014-09-13 LAB — SURGICAL PCR SCREEN
MRSA, PCR: NEGATIVE
Staphylococcus aureus: NEGATIVE

## 2014-09-13 SURGERY — ICD IMPLANT
Anesthesia: LOCAL

## 2014-09-13 MED ORDER — HEPARIN (PORCINE) IN NACL 2-0.9 UNIT/ML-% IJ SOLN
INTRAMUSCULAR | Status: DC | PRN
  Administered 2014-09-13: 500 mL

## 2014-09-13 MED ORDER — SPIRONOLACTONE 12.5 MG HALF TABLET
12.5000 mg | ORAL_TABLET | Freq: Every day | ORAL | Status: DC
  Administered 2014-09-14: 09:00:00 12.5 mg via ORAL
  Filled 2014-09-13: qty 1

## 2014-09-13 MED ORDER — SODIUM CHLORIDE 0.9 % IV BOLUS (SEPSIS)
500.0000 mL | Freq: Once | INTRAVENOUS | Status: AC
Start: 2014-09-13 — End: 2014-09-13
  Administered 2014-09-13: 21:00:00 500 mL via INTRAVENOUS

## 2014-09-13 MED ORDER — MUPIROCIN 2 % EX OINT
TOPICAL_OINTMENT | CUTANEOUS | Status: AC
  Filled 2014-09-13: qty 22

## 2014-09-13 MED ORDER — LORAZEPAM 0.5 MG PO TABS
0.5000 mg | ORAL_TABLET | Freq: Two times a day (BID) | ORAL | Status: DC
  Administered 2014-09-14: 0.5 mg via ORAL
  Filled 2014-09-13: qty 1

## 2014-09-13 MED ORDER — FENTANYL CITRATE (PF) 100 MCG/2ML IJ SOLN
INTRAMUSCULAR | Status: DC | PRN
  Administered 2014-09-13 (×3): 25 ug via INTRAVENOUS

## 2014-09-13 MED ORDER — ONDANSETRON HCL 4 MG/2ML IJ SOLN: 4.0000 mg | Freq: Four times a day (QID) | INTRAMUSCULAR | Status: DC | PRN

## 2014-09-13 MED ORDER — SODIUM CHLORIDE 0.9 % IR SOLN
80.0000 mg | Status: AC
  Administered 2014-09-13: 80 mg

## 2014-09-13 MED ORDER — VANCOMYCIN HCL IN DEXTROSE 1-5 GM/200ML-% IV SOLN
1000.0000 mg | INTRAVENOUS | Status: DC
  Filled 2014-09-13: qty 200

## 2014-09-13 MED ORDER — VANCOMYCIN HCL 1000 MG IV SOLR
1000.0000 mg | INTRAVENOUS | Status: DC | PRN
  Administered 2014-09-13: 1000 mg via INTRAVENOUS

## 2014-09-13 MED ORDER — MIDAZOLAM HCL 5 MG/5ML IJ SOLN
INTRAMUSCULAR | Status: AC
  Filled 2014-09-13: qty 25

## 2014-09-13 MED ORDER — LIDOCAINE HCL (PF) 1 % IJ SOLN
INTRAMUSCULAR | Status: AC
Start: 2014-09-13 — End: 2014-09-13
  Filled 2014-09-13: qty 60

## 2014-09-13 MED ORDER — VANCOMYCIN HCL IN DEXTROSE 1-5 GM/200ML-% IV SOLN
1000.0000 mg | Freq: Two times a day (BID) | INTRAVENOUS | Status: AC
  Administered 2014-09-14: 1000 mg via INTRAVENOUS
  Filled 2014-09-13 (×2): qty 200

## 2014-09-13 MED ORDER — SODIUM CHLORIDE 0.9 % IJ SOLN: 3.0000 mL | Freq: Two times a day (BID) | INTRAMUSCULAR | Status: DC

## 2014-09-13 MED ORDER — MIDAZOLAM HCL 5 MG/5ML IJ SOLN
INTRAMUSCULAR | Status: DC | PRN
  Administered 2014-09-13 (×2): 1 mg via INTRAVENOUS
  Administered 2014-09-13: 2 mg via INTRAVENOUS
  Administered 2014-09-13: 1 mg via INTRAVENOUS

## 2014-09-13 MED ORDER — MIDAZOLAM HCL 5 MG/5ML IJ SOLN
INTRAMUSCULAR | Status: AC
Start: 2014-09-13 — End: 2014-09-13
  Filled 2014-09-13: qty 25

## 2014-09-13 MED ORDER — FENTANYL CITRATE (PF) 100 MCG/2ML IJ SOLN
INTRAMUSCULAR | Status: AC
Start: 2014-09-13 — End: 2014-09-13
  Filled 2014-09-13: qty 4

## 2014-09-13 MED ORDER — PANTOPRAZOLE SODIUM 40 MG PO TBEC
40.0000 mg | DELAYED_RELEASE_TABLET | Freq: Two times a day (BID) | ORAL | Status: DC
  Administered 2014-09-13 – 2014-09-14 (×2): 40 mg via ORAL
  Filled 2014-09-13 (×2): qty 1

## 2014-09-13 MED ORDER — MUPIROCIN 2 % EX OINT
TOPICAL_OINTMENT | Freq: Two times a day (BID) | CUTANEOUS | Status: DC
  Administered 2014-09-13: 14:00:00 via NASAL
  Filled 2014-09-13: qty 22

## 2014-09-13 MED ORDER — LIDOCAINE HCL (PF) 1 % IJ SOLN
INTRAMUSCULAR | Status: DC | PRN
  Administered 2014-09-13: 61 mL

## 2014-09-13 MED ORDER — HEPARIN (PORCINE) IN NACL 2-0.9 UNIT/ML-% IJ SOLN
INTRAMUSCULAR | Status: AC
  Filled 2014-09-13: qty 500

## 2014-09-13 MED ORDER — SODIUM CHLORIDE 0.9 % IJ SOLN: 3.0000 mL | INTRAMUSCULAR | Status: DC | PRN

## 2014-09-13 MED ORDER — ACETAMINOPHEN 325 MG PO TABS
325.0000 mg | ORAL_TABLET | ORAL | Status: DC | PRN
  Administered 2014-09-13 – 2014-09-14 (×3): 650 mg via ORAL
  Filled 2014-09-13 (×3): qty 2

## 2014-09-13 MED ORDER — SODIUM CHLORIDE 0.9 % IV SOLN
INTRAVENOUS | Status: DC
Start: 2014-09-13 — End: 2014-09-13
  Administered 2014-09-13: 14:00:00 via INTRAVENOUS

## 2014-09-13 MED ORDER — CARVEDILOL 3.125 MG PO TABS
6.2500 mg | ORAL_TABLET | Freq: Two times a day (BID) | ORAL | Status: DC
  Administered 2014-09-13 – 2014-09-14 (×2): 6.25 mg via ORAL
  Filled 2014-09-13 (×2): qty 2

## 2014-09-13 MED ORDER — IOHEXOL 350 MG/ML SOLN
INTRAVENOUS | Status: DC | PRN
  Administered 2014-09-13: 10 mL via INTRAVENOUS

## 2014-09-13 MED ORDER — LIDOCAINE HCL (PF) 1 % IJ SOLN
INTRAMUSCULAR | Status: AC
Start: 2014-09-13 — End: 2014-09-13
  Filled 2014-09-13: qty 30

## 2014-09-13 MED ORDER — LISINOPRIL 5 MG PO TABS
5.0000 mg | ORAL_TABLET | Freq: Every day | ORAL | Status: DC
  Administered 2014-09-14: 09:00:00 5 mg via ORAL
  Filled 2014-09-13: qty 1

## 2014-09-13 MED ORDER — SODIUM CHLORIDE 0.9 % IV SOLN: 250.0000 mL | INTRAVENOUS | Status: DC | PRN

## 2014-09-13 MED ORDER — CHLORHEXIDINE GLUCONATE 4 % EX LIQD
60.0000 mL | Freq: Once | CUTANEOUS | Status: DC
  Filled 2014-09-13: qty 60

## 2014-09-13 MED ORDER — SERTRALINE HCL 50 MG PO TABS
50.0000 mg | ORAL_TABLET | Freq: Every day | ORAL | Status: DC
  Administered 2014-09-14: 09:00:00 50 mg via ORAL
  Filled 2014-09-13: qty 1

## 2014-09-13 MED ORDER — SODIUM CHLORIDE 0.9 % IR SOLN
Status: AC
  Filled 2014-09-13: qty 2

## 2014-09-13 MED ORDER — LEVOTHYROXINE SODIUM 25 MCG PO TABS: 0.1250 ug | ORAL_TABLET | Freq: Every day | ORAL | Status: DC

## 2014-09-13 SURGICAL SUPPLY — 6 items
CABLE SURGICAL S-101-97-12 (CABLE) ×2 IMPLANT
ICD FORTIFY ASSU VR CD1357-40Q (ICD Generator) ×2 IMPLANT
LEAD DURATA 7122Q-58CM (Lead) ×2 IMPLANT
PAD DEFIB LIFELINK (PAD) ×2 IMPLANT
SHEATH CLASSIC 7F (SHEATH) ×2 IMPLANT
TRAY PACEMAKER INSERTION (CUSTOM PROCEDURE TRAY) ×2 IMPLANT

## 2014-09-13 NOTE — H&P (View-Only) (Signed)
Electrophysiology Office Note Date: 08/20/2014  ID:  [name], DOB Aug 21, 1955, MRN [id number]  PCP: [name], MD Primary Cardiologist: Aundra Dubin Electrophysiologist: Allred  CC: to discuss CRTD implant  [name] is a 59 y.o. female seen today for Dr Rayann Heman.  She was originally evaluated by EP September of 2015 but was felt to be a poor candidate for ICD implantation at that time due to recent suicide attempt.  She stopped drinking ETOH and was seen again in April of this year for consideration of CRTD.  Pre-procedure labs demonstrated significant anemia and her procedure was cancelled until work up could be completed.  She has been seen by GI and heme/onc.  GI workup did not identify definite source of anemia. She has been placed on iron supplementation and undergone transfusion for iron deficiency anemia with significant improvement.  She presents today for further discussion of ICD implant.  Since last being seen in our clinic, the patient reports doing very well.  She denies chest pain, palpitations, dyspnea, PND, orthopnea, nausea, vomiting, dizziness, syncope, edema, weight gain, or early satiety.  Her HF symptoms are class 2a  Past Medical History  Diagnosis Date  . Diverticulosis   . Pancreatitis 1980, 2015    in the setting of ETOH  . chronic systolic heart failure     a. ECHO (05/08/13): EF 20-25%, diff HK with akinesis of basal inferior wall, grade 1 DD, trivial MR, trivial TR b) RHC (05/06/13): RA 7, RV 30/2, PA 31/19 (24), PCWP 10, Fick CO/CI: 2.9/1.74, Thermo CO/Ci: 2.4/1.4, PA sat 53%  . Ischemic cardiomyopathy   . CAD (coronary artery disease)     a. LHC (04/2013): Chronically occluded LAD, moderate dz in large OM1 and severe dz and small posterior lateral vessel    . Persistent atrial fibrillation 2015  . Suicidal overdose 10/03/13    Overdoes on Beta Blockers & Xarelto [facility] ED)  . Anxiety 2015   . Psoriasis 1968  . GERD (gastroesophageal reflux disease)  2015   . Hyperlipidemia 2015  . Hypertension 2015  . ETOH abuse 1981    started abusing alcohol at age 11, quit   . Stroke 2011    left side weakness, minimal  . Hypothyroidism   . PONV (postoperative nausea and vomiting)   . Mass of right breast on mammogram 07/14/2014   Past Surgical History  Procedure Laterality Date  . Colostomy takedown  2007  . Exploratory laparotomy with colon resection, colostomy  2007  . Esophagogastroduodenoscopy (egd) with propofol N/A 11/22/2013    Dr. Oneida Alar: gastritis and duodenitis, negative H.pylori  . Esophageal biopsy N/A 11/22/2013    Procedure: GASTRIC BIOPSIES;  Surgeon: Danie Binder, MD;  Location: [facility] ORS;  Service: Endoscopy;  Laterality: N/A;  . Left and right heart catheterization with coronary angiogram N/A 05/10/2013    Procedure: LEFT AND RIGHT HEART CATHETERIZATION WITH CORONARY ANGIOGRAM;  Surgeon: [name], MD;  Location: [facility] CATH LAB;  Service: Cardiovascular;  Laterality: N/A;  . Left heart catheterization with coronary angiogram N/A 09/03/2013    Procedure: LEFT HEART CATHETERIZATION WITH CORONARY ANGIOGRAM;  Surgeon: Sinclair Grooms, MD;  Location: [facility] CATH LAB;  Service: Cardiovascular;  Laterality: N/A;  . Colonoscopy with propofol N/A 03/29/2014    SLF: 1. No definite source for anemia identified 2. 8 colon polyps removed 3. Severe diverticulosis noted the transverse colon  and right colon 4. Small internal hemorrohids.   . Polypectomy N/A 03/29/2014  Procedure: POLYPECTOMY;  Surgeon: Danie Binder, MD;  Location: [facility] ORS;  Service: Endoscopy;  Laterality: N/A;  ascending colon, sigmoid colon x4, rectal x3  . Left and right heart catheterization with coronary angiogram N/A 04/14/2014    Procedure: LEFT AND RIGHT HEART CATHETERIZATION WITH CORONARY ANGIOGRAM;  Surgeon: Larey Dresser, MD;  Location: [facility] CATH LAB;  Service: Cardiovascular;  Laterality: N/A;  . Agile capsule N/A 06/02/2014    Procedure: AGILE CAPSULE;  Surgeon: Danie Binder, MD;  Location: [facility] ENDO SUITE;  Service: Endoscopy;  Laterality: N/A;  0800  . Givens capsule study N/A 06/10/2014    Procedure: GIVENS CAPSULE STUDY;  Surgeon: Danie Binder, MD;  Location: [facility] ENDO SUITE;  Service: Endoscopy;  Laterality: N/A;  0700    Current Outpatient Prescriptions  Medication Sig Dispense Refill  . acetaminophen (TYLENOL) 325 MG tablet Take 650 mg by mouth every 6 (six) hours as needed for fever or headache.    Marland Kitchen apixaban (ELIQUIS) 5 MG TABS tablet Take 1 tablet (5 mg total) by mouth 2 (two) times daily. 60 tablet 3  . atorvastatin (LIPITOR) 80 MG tablet Take 1 tablet (80 mg total) by mouth daily. 30 tablet 6  . carvedilol (COREG) 6.25 MG tablet Take 1 tablet (6.25 mg total) by mouth 2 (two) times daily with a meal. Hold for heart rate less than 60 60 tablet 3  . furosemide (LASIX) 20 MG tablet Take 1 tablet (20 mg total) by mouth every other day. 15 tablet 6  . levothyroxine (SYNTHROID, LEVOTHROID) 25 MCG tablet Take 0.125 mcg by mouth daily before breakfast.    . lisinopril (PRINIVIL,ZESTRIL) 5 MG tablet Take 1 tablet (5 mg total) by mouth daily. 90 tablet 3  . LORazepam (ATIVAN) 0.5 MG tablet Take 1 tablet (0.5 mg total) by mouth 2 (two) times daily. 60 tablet 2  . nitroGLYCERIN (NITROSTAT) 0.3 MG SL tablet Place 0.3 mg under the tongue every 5 (five) minutes as needed for chest pain (MAX 3 TABLETS).     . ondansetron (ZOFRAN) 4 MG tablet Take 4 mg by mouth every 8 (eight) hours as needed for nausea or vomiting.    . pantoprazole (PROTONIX) 40 MG tablet TAKE 1 TABLET BY MOUTH TWICE DAILY BEFORE A MEAL. 60 tablet 5  . sertraline (ZOLOFT) 50 MG tablet Take 1 tablet (50 mg total) by mouth daily. 90 tablet 1  . spironolactone (ALDACTONE) 25 MG tablet Take 0.5 tablets (12.5 mg total) by mouth daily. 15 tablet 3   No current facility-administered medications for this visit.    Allergies:   Morphine and related and Penicillins   Social History: History   Social  History  . Marital Status: Married    Spouse Name: N/A  . Number of Children: 2  . Years of Education: 10   Occupational History  . Unemployed     Social History Main Topics  . Smoking status: Current Some Day Smoker -- 0.25 packs/day for 47 years    Types: Cigarettes  . Smokeless tobacco: Never Used     Comment: she smokes occasionally, trying to quit  . Alcohol Use: 0.0 oz/week    0 Standard drinks or equivalent per week     Comment: last drink in 03/863, former alcoholic  . Drug Use: No  . Sexual Activity: No   Other Topics Concern  . Not on file   Social History Narrative   Lived previously with her spouse in an [place].  Son and daughter   Daughter in [place]   Son in [place]   Two grandchildren.       Presently in a group home [place], moved in 10/13/13.           Family History: Family History  Problem Relation Age of Onset  . CAD Father 72    MI  . CAD Mother 53    MI  . Alcohol abuse Mother   . CAD Sister 34    Stent  . Diabetes Sister   . CAD Brother 29    MI  . Cancer Neg Hx   . Colon cancer Mother     in her 55s    Review of Systems: All other systems reviewed and are otherwise negative except as noted above.   Physical Exam: VS:  LMP 01/22/1988 (Approximate) , BMI There is no weight on file to calculate BMI. Wt Readings from Last 3 Encounters:  08/04/14 118 lb 4.8 oz (53.661 kg)  07/14/14 116 lb 6.4 oz (52.799 kg)  06/16/14 118 lb 8 oz (53.751 kg)    GEN- The patient is well appearing, alert and oriented x 3 today.   HEENT: normocephalic, atraumatic; sclera clear, conjunctiva pink; hearing intact; oropharynx clear; neck supple, no JVP Lymph- no cervical lymphadenopathy Lungs- Clear to ausculation bilaterally, normal work of breathing.  No wheezes, rales, rhonchi Heart- Regular rate and rhythm, no murmurs, rubs or gallops, PMI not laterally displaced GI- soft, non-tender, non-distended, bowel sounds present, no  hepatosplenomegaly Extremities- no clubbing, cyanosis, or edema; DP/PT/radial pulses 2+ bilaterally MS- no significant deformity or atrophy Skin- warm and dry, no rash or lesion  Psych- euthymic mood, full affect Neuro- strength and sensation are intact   EKG:  EKG is ordered today. The ekg ordered today shows sinus rhythm, rate 62, PVC's, QRS 117msec  Recent Labs: 09/02/2013: Pro B Natriuretic peptide (BNP) 1566.0* 10/08/2013: Magnesium 2.1 04/09/2014: B Natriuretic Peptide 1385.0*; TSH 4.900* 08/04/2014: ALT 38; BUN 16; Creatinine, Ser 1.02*; Hemoglobin 11.7*; Platelets 246; Potassium 4.1; Sodium 142    Other studies Reviewed: Additional studies/ records that were reviewed today include: Dr Jackalyn Lombard office notes, GI and heme notes  Assessment and Plan: 1.  Chronic systolic dysfunction/ICM Euvolemic on exam She has been on guideline directed therapy without improvement in LVEF.  ICD implant previously discussed with the patient by Dr Rayann Heman but procedure was postponed 2/2 iron deficiency anemia.  She has since been seen by GI and heme/onc with iron supplementation and transfusions and improvement in labs.  Risks, benefits of ICD implantation again reviewed with the patient who wishes to proceed with procedure. Will schedule with Dr Rayann Heman at the next available time.  QRS was previously 166msec with IVCD, now 115msec on today's EKG.  Discussed with Dr Rayann Heman, will plan single chamber ICD implantation.   2.  Paroxysmal atrial fibrillation Maintaining SR Continue Eliquis for CHADS2VASC of at least 6 Hold Eliquis for 24 hours prior to device implant  3.  Iron deficiency anemia As above Continue Fe supplementation  4.  HTN Stable No change required today  5.  ETOH abuse Continued cessation advised  6.  Tobacco abuse Cessation encouraged She is down to 2-3 cigarettes/day   Current medicines are reviewed at length with the patient today.   The patient does not have concerns  regarding her medicines.  The following changes were made today:  none  Labs/ tests ordered today include: pre-procedure labs    Disposition:  Follow up with Dr Rayann Heman after ICD implant; follow up with Dr Aundra Dubin as scheduled.    Signed, Chanetta Marshall, NP 08/20/2014 5:22 PM   [address] [place] [address] [phone] (office) [phone] (fax)

## 2014-09-13 NOTE — Progress Notes (Addendum)
  Notified by RN that patient is hypotensive with SBP in the 70s. Patient was admitted today for elective single chamber ICD for cardiomyopathy with EF of 25%. Patient is asymptomatic and HR is stable w/o tachycardia. Case was discussed with Dr. Harl Bowie. Given that she is asymptomatic and not tachycardic, there is low suspicion for pericardial effusion. We will bolus with 500 ml of normal saline. Will also check a CBC. Hold Coreg tonight. Night fellow paged for notification.   SIMMONS, BRITTAINY

## 2014-09-13 NOTE — Interval H&P Note (Signed)
History and Physical Interval Note:  09/13/2014 2:51 PM  [name]  has presented today for surgery, with the diagnosis of cardiomyopathy  The various methods of treatment have been discussed with the patient and family. After consideration of risks, benefits and other options for treatment, the patient has consented to  Procedure(s): ICD Implant (N/A) as a surgical intervention .  The patient's history has been reviewed, patient examined, no change in status, stable for surgery.  I have reviewed the patient's chart and labs.  Questions were answered to the patient's satisfaction.    ICD Criteria  Current LVEF:25%. Within 12 months prior to implant: Yes   Heart failure history: Yes, Class II  Cardiomyopathy history: Yes, Ischemic Cardiomyopathy.  Atrial Fibrillation/Atrial Flutter: Yes, Paroxysmal.  Ventricular tachycardia history:NO  Cardiac arrest history: No.  History of syndromes with risk of sudden death: No.  Previous ICD: No.  Current ICD indication: Primary  PPM indication: No.  Beta Blocker therapy for 3 or more months: Yes, prescribed.   Ace Inhibitor/ARB therapy for 3 or more months: Yes, prescribed.       [name]

## 2014-09-13 NOTE — Progress Notes (Signed)
Blood pressure by automatic cuff showed SBP of 70.  Rechecked manual pressure and found to be SBP of 76; unable to determine diastolic.  Justice Britain, PA informed.  Patient is asymptomatic.

## 2014-09-14 ENCOUNTER — Ambulatory Visit (HOSPITAL_COMMUNITY): Payer: Medicaid Other

## 2014-09-14 ENCOUNTER — Encounter (HOSPITAL_COMMUNITY): Payer: Self-pay | Admitting: Cardiology

## 2014-09-14 DIAGNOSIS — I480 Paroxysmal atrial fibrillation: Secondary | ICD-10-CM | POA: Diagnosis not present

## 2014-09-14 DIAGNOSIS — D509 Iron deficiency anemia, unspecified: Secondary | ICD-10-CM | POA: Diagnosis not present

## 2014-09-14 DIAGNOSIS — I255 Ischemic cardiomyopathy: Secondary | ICD-10-CM | POA: Diagnosis not present

## 2014-09-14 DIAGNOSIS — Z95810 Presence of automatic (implantable) cardiac defibrillator: Secondary | ICD-10-CM | POA: Diagnosis not present

## 2014-09-14 DIAGNOSIS — I5022 Chronic systolic (congestive) heart failure: Secondary | ICD-10-CM | POA: Diagnosis not present

## 2014-09-14 LAB — BASIC METABOLIC PANEL
ANION GAP: 6 (ref 5–15)
BUN: 15 mg/dL (ref 6–20)
CALCIUM: 8.4 mg/dL — AB (ref 8.9–10.3)
CO2: 23 mmol/L (ref 22–32)
Chloride: 109 mmol/L (ref 101–111)
Creatinine, Ser: 1.15 mg/dL — ABNORMAL HIGH (ref 0.44–1.00)
GFR, EST AFRICAN AMERICAN: 59 mL/min — AB (ref >60)
GFR, EST NON AFRICAN AMERICAN: 51 mL/min — AB (ref >60)
Glucose, Bld: 102 mg/dL — ABNORMAL HIGH (ref 65–99)
POTASSIUM: 3.6 mmol/L (ref 3.5–5.1)
SODIUM: 138 mmol/L (ref 135–145)

## 2014-09-14 MED ORDER — YOU HAVE A PACEMAKER BOOK
Freq: Once | Status: AC
  Administered 2014-09-14: 03:00:00
  Filled 2014-09-14: qty 1

## 2014-09-14 NOTE — Discharge Summary (Signed)
Physician Discharge Summary       Patient ID: [name] MRN: [id number] DOB/AGE: 1955/12/20 59 y.o.  Admit date: 09/13/2014 Discharge date: 09/14/2014 Primary Cardiologist:  Dr. Aundra Dubin EP: Dr. Rayann Heman   Discharge Diagnoses:  Principal Problem:   Ischemic cardiomyopathy Active Problems:   Chronic systolic CHF (congestive heart failure)   Cardiomyopathy, ischemic- EF 25-30% 2D 09/02/13   S/P ICD (internal cardiac defibrillator) procedure, [employer] Jude device, 09/13/14   Discharged Condition: good  Procedures: 8/23/161. Left upper extremity venography 2. ICD implantation [place] (serial Number [id number]) ICD- by Dr. Rayann Heman.    Hospital Course: 59 year old female with ICM with EF 25-30% presented for ICD placement.  She has class II heart failure, PAF and despite medical therapy with BB and ACE/ARB her for 3 mnths , EF continues to be 25-30%.   She underwent procedure and tolerated it well.  Post procedure with hypotension treated and resolved with IV fluids.  By AM of discharge she was seen and evaluated by Dr. Rayann Heman and with review of interrogation of ICD found normal and CXR negative for pneumothorax was found stable for discharge.  She will begin eliquis 09/15/14 at 8 am.    She will follow up in device clinic and then with Dr. Rayann Heman.  BP has improved at discharge at 115/48.    Consults: None  Significant Diagnostic Studies:  BMP Latest Ref Rng 09/14/2014 08/30/2014 08/04/2014  Glucose 65 - 99 mg/dL 102(H) 77 100(H)  BUN 6 - 20 mg/dL 15 18 16   Creatinine 0.44 - 1.00 mg/dL 1.15(H) 1.12 1.02(H)  Sodium 135 - 145 mmol/L 138 141 142  Potassium 3.5 - 5.1 mmol/L 3.6 4.7 4.1  Chloride 101 - 111 mmol/L 109 106 104  CO2 22 - 32 mmol/L 23 31 28   Calcium 8.9 - 10.3 mg/dL 8.4(L) 9.6 9.6   CBC Latest Ref Rng 09/13/2014 08/30/2014 08/04/2014  WBC 4.0 - 10.5 K/uL 5.7 5.5 5.0  Hemoglobin 12.0 - 15.0 g/dL 10.4(L) 11.9(L) 11.7(L)  Hematocrit 36.0 -  46.0 % 30.9(L) 35.5(L) 35.7(L)  Platelets 150 - 400 K/uL 176 227.0 246   EKG: Sinus bradycardia Rightward axis T wave abnormality, consider inferolateral ischemia Abnormal ECG No significant change since last tracing    CXR 2 view: COMPARISON: 04/09/2014 FINDINGS: Single chamber left cardiac ICD has been placed. Lead tip in the right ventricle. The lungs are clear without airspace disease or pulmonary edema. Negative for a pneumothorax. Heart size is normal. Improved aeration at the lung bases compared to the prior examination. No acute bone abnormality. IMPRESSION: Placement of left cardiac ICD. Negative for pneumothorax. No focal lung disease.   Discharge Exam: Blood pressure 115/48, pulse 60, temperature 97.8 F (36.6 C), temperature source Oral, resp. rate 17, height 5\' 3"  (1.6 m), weight 121 lb 7.6 oz (55.1 kg), last menstrual period 01/22/1988, SpO2 99 %.   Disposition: 01-Home or Self Care     Medication List    TAKE these medications        acetaminophen 325 MG tablet  Commonly known as:  TYLENOL  Take 650 mg by mouth every 6 (six) hours as needed for fever or headache.     apixaban 5 MG Tabs tablet  Commonly known as:  ELIQUIS  Take 1 tablet (5 mg total) by mouth 2 (two) times daily.     atorvastatin 80 MG tablet  Commonly known as:  LIPITOR  Take 1 tablet (80 mg  total) by mouth daily.     carvedilol 6.25 MG tablet  Commonly known as:  COREG  Take 1 tablet (6.25 mg total) by mouth 2 (two) times daily with a meal. Hold for heart rate less than 60     furosemide 20 MG tablet  Commonly known as:  LASIX  Take 1 tablet (20 mg total) by mouth every other day.     levothyroxine 25 MCG tablet  Commonly known as:  SYNTHROID, LEVOTHROID  Take 0.125 mcg by mouth daily before breakfast.     lisinopril 5 MG tablet  Commonly known as:  PRINIVIL,ZESTRIL  Take 1 tablet (5 mg total) by mouth daily.     LORazepam 0.5 MG tablet  Commonly known as:  ATIVAN    Take 1 tablet (0.5 mg total) by mouth 2 (two) times daily.     nitroGLYCERIN 0.3 MG SL tablet  Commonly known as:  NITROSTAT  Place 0.3 mg under the tongue every 5 (five) minutes as needed for chest pain (MAX 3 TABLETS).     ondansetron 4 MG tablet  Commonly known as:  ZOFRAN  Take 4 mg by mouth every 8 (eight) hours as needed for nausea or vomiting.     pantoprazole 40 MG tablet  Commonly known as:  PROTONIX  TAKE 1 TABLET BY MOUTH TWICE DAILY BEFORE A MEAL.     sertraline 50 MG tablet  Commonly known as:  ZOLOFT  Take 1 tablet (50 mg total) by mouth daily.     spironolactone 25 MG tablet  Commonly known as:  ALDACTONE  Take 0.5 tablets (12.5 mg total) by mouth daily.       Follow-up Information    Follow up with [facility] On 09/28/2014.   Why:  at [facility] for wound check   Contact information:   [address] [place] [address]       Follow up with [name], MD On 12/16/2014.   Specialty:  Cardiology   Why:  at 9:30AM   Contact information:   [place] [address] [phone]        Discharge Instructions: Resume Eliquis 09/15/14 at 8AM  Follow post ICD instructions, on hand out.     Signed: Isaiah Serge Nurse Practitioner-Certified [facility]: [facility] 09/14/2014, 9:08 AM  Time spent on discharge : > 30 minutes.     [name] MD

## 2014-09-14 NOTE — Progress Notes (Signed)
VSS this am No complaints Device interrogation is reviewed and normal CXR is reviewed  DC to home on previous medicine regimen Resume eliquis in 24 hours  Routine wound care and follow-up  [name] MD, [facility] 09/14/2014 8:48 AM

## 2014-09-14 NOTE — Discharge Instructions (Signed)
° ° °  Supplemental Discharge Instructions for  Pacemaker/Defibrillator Patients  Activity No heavy lifting or vigorous activity with your left/right arm for 6 to 8 weeks.  Do not raise your left/right arm above your head for one week.  Gradually raise your affected arm as drawn below.           __       09/18/14                09/19/14                         09/20/14                   09/21/14  NO DRIVING for  1 week   ; you may begin driving on  7/51/02   .  WOUND CARE - Keep the wound area clean and dry.  Do not get this area wet for one week. No showers for one week; you may shower on   09/21/14  . - The tape/steri-strips on your wound will fall off; do not pull them off.  No bandage is needed on the site.  DO  NOT apply any creams, oils, or ointments to the wound area. - If you notice any drainage or discharge from the wound, any swelling or bruising at the site, or you develop a fever > 101? F after you are discharged home, call the office at once.  Special Instructions - You are still able to use cellular telephones; use the ear opposite the side where you have your pacemaker/defibrillator.  Avoid carrying your cellular phone near your device. - When traveling through airports, show security personnel your identification card to avoid being screened in the metal detectors.  Ask the security personnel to use the hand wand. - Avoid arc welding equipment, MRI testing (magnetic resonance imaging), TENS units (transcutaneous nerve stimulators).  Call the office for questions about other devices. - Avoid electrical appliances that are in poor condition or are not properly grounded. - Microwave ovens are safe to be near or to operate.  Additional information for defibrillator patients should your device go off: - If your device goes off ONCE and you feel fine afterward, notify the device clinic nurses. - If your device goes off ONCE and you do not feel well afterward, call 911. - If your device  goes off TWICE, call 911. - If your device goes off THREE times in one day, call 911.  DO NOT DRIVE YOURSELF OR A FAMILY MEMBER WITH A DEFIBRILLATOR TO THE HOSPITAL--CALL 911.    Resume eliquis 09/15/14 at 8am

## 2014-09-23 ENCOUNTER — Encounter: Payer: Self-pay | Admitting: Nurse Practitioner

## 2014-09-28 ENCOUNTER — Ambulatory Visit: Payer: Self-pay | Admitting: Nurse Practitioner

## 2014-09-28 ENCOUNTER — Ambulatory Visit (INDEPENDENT_AMBULATORY_CARE_PROVIDER_SITE_OTHER): Payer: Medicaid Other | Admitting: *Deleted

## 2014-09-28 DIAGNOSIS — I5022 Chronic systolic (congestive) heart failure: Secondary | ICD-10-CM | POA: Diagnosis not present

## 2014-09-28 DIAGNOSIS — I255 Ischemic cardiomyopathy: Secondary | ICD-10-CM | POA: Diagnosis not present

## 2014-09-28 LAB — CUP PACEART INCLINIC DEVICE CHECK
Brady Statistic RV Percent Paced: 0.03 %
HIGH POWER IMPEDANCE MEASURED VALUE: 71 Ohm
Lead Channel Impedance Value: 637.5 Ohm
Lead Channel Sensing Intrinsic Amplitude: 12 mV
Lead Channel Setting Pacing Amplitude: 3.5 V
MDC IDC MSMT BATTERY REMAINING LONGEVITY: 106.8 mo
MDC IDC MSMT LEADCHNL RV PACING THRESHOLD AMPLITUDE: 0.5 V
MDC IDC MSMT LEADCHNL RV PACING THRESHOLD PULSEWIDTH: 0.5 ms
MDC IDC SESS DTM: 20160907131415
MDC IDC SET LEADCHNL RV PACING PULSEWIDTH: 0.5 ms
MDC IDC SET LEADCHNL RV SENSING SENSITIVITY: 0.5 mV
Pulse Gen Serial Number: 7254873
Zone Setting Detection Interval: 270 ms
Zone Setting Detection Interval: 325 ms

## 2014-09-28 NOTE — Progress Notes (Signed)
Wound check appointment. Steri-strips removed. Wound without redness or edema. Incision edges approximated, wound well healed. Normal device function. Threshold, sensing, and impedances consistent with implant measurements. Device programmed at 3.5V for extra safety margin until 3 month visit. Histogram distribution appropriate for patient and level of activity. No ventricular arrhythmias noted. Patient educated about wound care, arm mobility, lifting restrictions, shock plan. ROV with JA 12/16/14.

## 2014-10-04 ENCOUNTER — Encounter (HOSPITAL_COMMUNITY): Payer: Self-pay | Admitting: Hematology & Oncology

## 2014-10-04 ENCOUNTER — Encounter (HOSPITAL_COMMUNITY): Payer: Medicaid Other | Attending: Hematology & Oncology | Admitting: Hematology & Oncology

## 2014-10-04 ENCOUNTER — Ambulatory Visit (HOSPITAL_COMMUNITY): Payer: Self-pay

## 2014-10-04 ENCOUNTER — Other Ambulatory Visit (HOSPITAL_COMMUNITY): Payer: Self-pay

## 2014-10-04 ENCOUNTER — Encounter (HOSPITAL_COMMUNITY): Payer: Medicaid Other

## 2014-10-04 VITALS — BP 137/68 | HR 62 | Temp 98.3°F | Resp 18 | Wt 125.5 lb

## 2014-10-04 DIAGNOSIS — E538 Deficiency of other specified B group vitamins: Secondary | ICD-10-CM | POA: Diagnosis not present

## 2014-10-04 DIAGNOSIS — D508 Other iron deficiency anemias: Secondary | ICD-10-CM | POA: Diagnosis present

## 2014-10-04 LAB — VITAMIN B12: VITAMIN B 12: 439 pg/mL (ref 180–914)

## 2014-10-04 LAB — IRON AND TIBC
IRON: 120 ug/dL (ref 28–170)
Saturation Ratios: 39 % — ABNORMAL HIGH (ref 10.4–31.8)
TIBC: 308 ug/dL (ref 250–450)
UIBC: 188 ug/dL

## 2014-10-04 LAB — FERRITIN: FERRITIN: 50 ng/mL (ref 11–307)

## 2014-10-04 NOTE — Progress Notes (Unsigned)
Labs drawn

## 2014-10-04 NOTE — Progress Notes (Signed)
[name] Ends, MD [place] [address]  Iron deficiency Anemia R breast cyst  CURRENT THERAPY: S/P IV Feraheme 510 mg on 06/24/2014 and 2 unit PRBC transfusion on 06/17/2014.  INTERVAL HISTORY: [name] 59 y.o. female returns for followup of iron deficiency anemia with Colonscopy on 03/29/2014 being negative and EGD on 11/22/2013 demonstrating duodenitis and gastritis. She also has a history of low B12.  The patient has visited her cardiologist and had her ICD placed.  She says that she is feeling a lot better.  She is smiling today. She will have dental work and will be fitted for dentures soon.    Previously in her life, she had a suicide attempt that dealt with her marriage.  She has now surpassed this situation and says that she is focusing on her health. She describes her mood as very positive.  She denies any black, tarry, or bloody stools.  She has no other concerns at this time.     Past Medical History  Diagnosis Date  . Diverticulosis   . Pancreatitis 1980, 2015    in the setting of ETOH  . chronic systolic heart failure     a. ECHO (05/08/13): EF 20-25%, diff HK with akinesis of basal inferior wall, grade 1 DD, trivial MR, trivial TR b) RHC (05/06/13): RA 7, RV 30/2, PA 31/19 (24), PCWP 10, Fick CO/CI: 2.9/1.74, Thermo CO/Ci: 2.4/1.4, PA sat 53%  . Ischemic cardiomyopathy   . CAD (coronary artery disease)     a. LHC (04/2013): Chronically occluded LAD, moderate dz in large OM1 and severe dz and small posterior lateral vessel    . Persistent atrial fibrillation 2015  . Suicidal overdose 10/03/13    Overdoes on Beta Blockers & Xarelto [facility] ED)  . Anxiety 2015   . Psoriasis 1968  . GERD (gastroesophageal reflux disease) 2015   . Hyperlipidemia 2015  . Hypertension 2015  . ETOH abuse 1981    started abusing alcohol at age 75, quit   . Hypothyroidism   . PONV (postoperative nausea and vomiting)   . Mass of right breast on mammogram  07/14/2014  . AICD (automatic cardioverter/defibrillator) present   . Myocardial infarction     "I've had many" (09/13/2014)  . Anginal pain   . Anemia   . History of blood transfusion     "related to losing blood from somewhere; never found out from where"  . Stroke 2011    left side weakness, minimal  . Depression   . 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/2014    has Hypertension; GERD (gastroesophageal reflux disease); H/O alcohol abuse; Tobacco abuse; Family history of premature CAD; Chronic systolic CHF (congestive heart failure); PAF (paroxysmal atrial fibrillation); Cardiomyopathy, ischemic- EF 25-30% 2D 09/02/13; CAD- total LAD- residual OM2 disease; Anxiety state; Suicide attempt by beta blocker overdose; Psoriasis; CKD (chronic kidney disease) stage 3, GFR 30-59 ml/min; FH: colon cancer; Cervical cancer screening; Hypothyroidism; Gastritis and gastroduodenitis; Anemia; Acute on chronic systolic CHF (congestive heart failure); CHF (congestive heart failure); CHF exacerbation; Mass of right breast on mammogram; Ischemic cardiomyopathy; and S/P ICD (internal cardiac defibrillator) procedure, St. Jude device, 09/13/14 on her problem list.     is allergic to morphine and related and penicillins.  Current Outpatient Prescriptions on File Prior to Visit  Medication Sig Dispense Refill  . apixaban (ELIQUIS) 5 MG TABS tablet Take 1 tablet (5 mg total) by mouth 2 (  two) times daily. 60 tablet 3  . atorvastatin (LIPITOR) 80 MG tablet Take 1 tablet (80 mg total) by mouth daily. 30 tablet 6  . carvedilol (COREG) 6.25 MG tablet Take 1 tablet (6.25 mg total) by mouth 2 (two) times daily with a meal. Hold for heart rate less than 60 60 tablet 3  . furosemide (LASIX) 20 MG tablet Take 1 tablet (20 mg total) by mouth every other day. 15 tablet 6  . levothyroxine (SYNTHROID, LEVOTHROID) 25 MCG tablet Take 0.125 mcg by mouth daily before breakfast.    . lisinopril  (PRINIVIL,ZESTRIL) 5 MG tablet Take 1 tablet (5 mg total) by mouth daily. 90 tablet 3  . LORazepam (ATIVAN) 0.5 MG tablet Take 1 tablet (0.5 mg total) by mouth 2 (two) times daily. 60 tablet 2  . ondansetron (ZOFRAN) 4 MG tablet Take 4 mg by mouth every 8 (eight) hours as needed for nausea or vomiting.    . pantoprazole (PROTONIX) 40 MG tablet TAKE 1 TABLET BY MOUTH TWICE DAILY BEFORE A MEAL. 60 tablet 5  . sertraline (ZOLOFT) 50 MG tablet Take 1 tablet (50 mg total) by mouth daily. 90 tablet 1  . spironolactone (ALDACTONE) 25 MG tablet Take 0.5 tablets (12.5 mg total) by mouth daily. (Patient taking differently: Take 12.5 mg by mouth daily. One tablet every other day) 15 tablet 3  . acetaminophen (TYLENOL) 325 MG tablet Take 650 mg by mouth every 6 (six) hours as needed for fever or headache.    . nitroGLYCERIN (NITROSTAT) 0.3 MG SL tablet Place 0.3 mg under the tongue every 5 (five) minutes as needed for chest pain (MAX 3 TABLETS).      No current facility-administered medications on file prior to visit.    Past Surgical History  Procedure Laterality Date  . Colostomy takedown  2007  . Abdominal exploration surgery  2007  . Esophagogastroduodenoscopy (egd) with propofol N/A 11/22/2013    Dr. Oneida Alar: gastritis and duodenitis, negative H.pylori  . Esophageal biopsy N/A 11/22/2013    Procedure: GASTRIC BIOPSIES;  Surgeon: Danie Binder, MD;  Location: [facility] ORS;  Service: Endoscopy;  Laterality: N/A;  . Left and right heart catheterization with coronary angiogram N/A 05/10/2013    Procedure: LEFT AND RIGHT HEART CATHETERIZATION WITH CORONARY ANGIOGRAM;  Surgeon: [name], MD;  Location: [facility] CATH LAB;  Service: Cardiovascular;  Laterality: N/A;  . Left heart catheterization with coronary angiogram N/A 09/03/2013    Procedure: LEFT HEART CATHETERIZATION WITH CORONARY ANGIOGRAM;  Surgeon: Sinclair Grooms, MD;  Location: [facility] CATH LAB;  Service: Cardiovascular;  Laterality: N/A;  . Colonoscopy  with propofol N/A 03/29/2014    SLF: 1. No definite source for anemia identified 2. 8 colon polyps removed 3. Severe diverticulosis noted the transverse colon  and right colon 4. Small internal hemorrohids.   . Polypectomy N/A 03/29/2014    Procedure: POLYPECTOMY;  Surgeon: Danie Binder, MD;  Location: [facility] ORS;  Service: Endoscopy;  Laterality: N/A;  ascending colon, sigmoid colon x4, rectal x3  . Left and right heart catheterization with coronary angiogram N/A 04/14/2014    Procedure: LEFT AND RIGHT HEART CATHETERIZATION WITH CORONARY ANGIOGRAM;  Surgeon: Larey Dresser, MD;  Location: [facility] CATH LAB;  Service: Cardiovascular;  Laterality: N/A;  . Agile capsule N/A 06/02/2014    Procedure: AGILE CAPSULE;  Surgeon: Danie Binder, MD;  Location: [facility] ENDO SUITE;  Service: Endoscopy;  Laterality: N/A;  0800  . Givens capsule study N/A 06/10/2014  Procedure: GIVENS CAPSULE STUDY;  Surgeon: Danie Binder, MD;  Location: [facility] END[facility] SUITE;  Service: Endoscopy;  Laterality: N/A;  0700  . Ep implantable device N/A 09/13/2014    Procedure: ICD Implant;  Surgeon: [name], MD;  Location: [facility];  Service: Cardiovascular;  Laterality: N/A;  . Colostomy reversal  ?2009  . Ileostomy  ?2009  . Ileostomy closure  ?2010  . Colon surgery    . Cardiac catheterization    . Ep implantable device  09/13/14    ICD St Jude, placed     Denies any headaches, dizziness, double vision, fevers, chills, night sweats, nausea, vomiting, diarrhea, constipation, chest pain, heart palpitations, shortness of breath, blood in stool, black tarry stool, urinary pain, urinary burning, urinary frequency, hematuria. 14 point review of systems was performed and is negative except as detailed under history of present illness and above    PHYSICAL EXAMINATION  ECOG PERFORMANCE STATUS: 0 - Asymptomatic  Filed Vitals:   10/04/14 1009  BP: 137/68  Pulse: 62  Temp: 98.3 F (36.8 C)  Resp: 18    GENERAL:alert, no  distress, well nourished, well developed, comfortable, cooperative and smiling SKIN: skin color, texture, turgor are normal, no rashes or significant lesions HEAD: Normocephalic, No masses, lesions, tenderness or abnormalities EYES: normal, PERRLA, EOMI, Conjunctiva are pink and non-injected EARS: External ears normal OROPHARYNX:lips, buccal mucosa, and tongue normal and mucous membranes are moist  NECK: supple, no adenopathy, thyroid normal size, non-tender, without nodularity, no stridor, non-tender, trachea midline LYMPH:  no palpable lymphadenopathy, no hepatosplenomegaly BREAST:left breast normal without mass, skin or nipple changes or axillary nodes, abnormal mass palpable in the 4 o clock position measuring about 1.5 cm in size.  It is mobile. LUNGS: clear to auscultation and percussion HEART: regular rate & rhythm, no murmurs, no gallops, S1 normal and S2 normal ICD in L chest wall ABDOMEN:abdomen soft, non-tender and normal bowel sounds BACK: Back symmetric, no curvature., No CVA tenderness EXTREMITIES:less then 2 second capillary refill, no joint deformities, effusion, or inflammation, no edema, no skin discoloration, no clubbing, no cyanosis  NEURO: alert & oriented x 3 with fluent speech, no focal motor/sensory deficits, gait normal   LABORATORY DATA: CBC    Component Value Date/Time   WBC 5.7 09/13/2014 2135   RBC 3.73* 09/13/2014 2135   RBC 3.00* 06/16/2014 1705   HGB 10.4* 09/13/2014 2135   HCT 30.9* 09/13/2014 2135   PLT 176 09/13/2014 2135   MCV 82.8 09/13/2014 2135   MCH 27.9 09/13/2014 2135   MCHC 33.7 09/13/2014 2135   RDW 20.0* 09/13/2014 2135   LYMPHSABS 1.5 08/04/2014 1101   MONOABS 0.5 08/04/2014 1101   EOSABS 0.2 08/04/2014 1101   BASOSABS 0.0 08/04/2014 1101      Chemistry      Component Value Date/Time   NA 138 09/14/2014 0301   K 3.6 09/14/2014 0301   CL 109 09/14/2014 0301   CO2 23 09/14/2014 0301   BUN 15 09/14/2014 0301   CREATININE 1.15*  09/14/2014 0301      Component Value Date/Time   CALCIUM 8.4* 09/14/2014 0301   ALKPHOS 80 08/04/2014 1101   AST 37 08/04/2014 1101   ALT 38 08/04/2014 1101   BILITOT 0.5 08/04/2014 1101       RADIOGRAPHIC STUDIES:  Dg Chest 2 View  09/14/2014   CLINICAL DATA:  Evaluate defibrillator placement.  EXAM: CHEST  2 VIEW  COMPARISON:  04/09/2014  FINDINGS: Single chamber left  cardiac ICD has been placed. Lead tip in the right ventricle. The lungs are clear without airspace disease or pulmonary edema. Negative for a pneumothorax. Heart size is normal. Improved aeration at the lung bases compared to the prior examination. No acute bone abnormality.  IMPRESSION: Placement of left cardiac ICD.  Negative for pneumothorax.  No focal lung disease.   Electronically Signed   By: Markus Daft M.D.   On: 09/14/2014 07:18     ASSESSMENT AND PLAN:  Severe iron deficiency anemia EGD 06/13/2014 with gastritis and duodenitis in setting of anticoagulation with fresh blood in stomach. History of suicide attempt Right breast cyst Chronic systolic dysfunction/ischemic cardiomyopathy/atrial fibrillation Single-chamber ICD placement, Eliquis, 09/13/2014  THERAPY PLAN:  She is currently doing well. She is very pleased that she has been able to have her ICD placement. She feels good. She notes she is having her teeth "worked on." Her plans within to return home. I advised her we will keep her apprised of the results of her laboratory studies today. We will keep follow-up on her iron levels moving forward she does continue on a blood thinner. We will tentatively schedule her back in 3 months. If she needs additional IV iron we will arrange for replacement.   All questions were answered. The patient knows to call the clinic with any problems, questions or concerns. We can certainly see the patient much sooner if necessary.   This document serves as a record of services personally performed by Ancil Linsey, MD. It  was created on her behalf by Janace Hoard, a trained medical scribe. The creation of this record is based on the scribe's personal observations and the provider's statements to them. This document has been checked and approved by the attending provider.  I have reviewed the above documentation for accuracy and completeness, and I agree with the above.  This note is electronically signed.  Kelby Fam. Whitney Muse, MD

## 2014-10-04 NOTE — Patient Instructions (Signed)
..  [place] at [facility] Discharge Instructions  RECOMMENDATIONS MADE BY THE CONSULTANT AND ANY TEST RESULTS WILL BE SENT TO YOUR REFERRING PHYSICIAN.  Labs today  Return in 3 month with labs and Dr. visit  Thank you for choosing [place] at [facility] to provide your oncology and hematology care.  To afford each patient quality time with our provider, please arrive at least 15 minutes before your scheduled appointment time.    You need to re-schedule your appointment should you arrive 10 or more minutes late.  We strive to give you quality time with our providers, and arriving late affects you and other patients whose appointments are after yours.  Also, if you no show three or more times for appointments you may be dismissed from the clinic at the providers discretion.     Again, thank you for choosing [facility].  Our hope is that these requests will decrease the amount of time that you wait before being seen by our physicians.       _____________________________________________________________  Should you have questions after your visit to [facility], please contact our office at [phone] between the hours of 8:30 a.m. and 4:30 p.m.  Voicemails left after 4:30 p.m. will not be returned until the following business day.  For prescription refill requests, have your pharmacy contact our office.

## 2014-10-05 ENCOUNTER — Other Ambulatory Visit (HOSPITAL_COMMUNITY): Payer: Self-pay | Admitting: Hematology & Oncology

## 2014-10-12 ENCOUNTER — Encounter (HOSPITAL_COMMUNITY): Payer: Medicaid Other | Attending: Oncology

## 2014-10-12 DIAGNOSIS — D509 Iron deficiency anemia, unspecified: Secondary | ICD-10-CM

## 2014-10-12 MED ORDER — SODIUM CHLORIDE 0.9 % IV SOLN
125.0000 mg | Freq: Once | INTRAVENOUS | Status: AC
  Administered 2014-10-12: 125 mg via INTRAVENOUS
  Filled 2014-10-12: qty 10

## 2014-10-12 MED ORDER — SODIUM CHLORIDE 0.9 % IV SOLN
INTRAVENOUS | Status: DC
  Administered 2014-10-12: 14:00:00 via INTRAVENOUS

## 2014-10-12 NOTE — Patient Instructions (Signed)
[place] at [facility] Discharge Instructions  RECOMMENDATIONS MADE BY THE CONSULTANT AND ANY TEST RESULTS WILL BE SENT TO YOUR REFERRING PHYSICIAN.  Today you received Ferric Gluconate 125 mg iron infusion. Return in 6 weeks for lab work. Report any issues/concerns to clinic as needed.  Thank you for choosing [name] at [facility] to provide your oncology and hematology care.  To afford each patient quality time with our provider, please arrive at least 15 minutes before your scheduled appointment time.    You need to re-schedule your appointment should you arrive 10 or more minutes late.  We strive to give you quality time with our providers, and arriving late affects you and other patients whose appointments are after yours.  Also, if you no show three or more times for appointments you may be dismissed from the clinic at the providers discretion.     Again, thank you for choosing [facility].  Our hope is that these requests will decrease the amount of time that you wait before being seen by our physicians.       _____________________________________________________________  Should you have questions after your visit to [facility], please contact our office at [phone] between the hours of 8:30 a.m. and 4:30 p.m.  Voicemails left after 4:30 p.m. will not be returned until the following business day.  For prescription refill requests, have your pharmacy contact our office.

## 2014-10-12 NOTE — Progress Notes (Signed)
Tolerated iron infusion well. 

## 2014-10-14 ENCOUNTER — Encounter: Payer: Self-pay | Admitting: Internal Medicine

## 2014-10-15 ENCOUNTER — Encounter (HOSPITAL_COMMUNITY): Payer: Self-pay

## 2014-10-15 ENCOUNTER — Other Ambulatory Visit: Payer: Self-pay

## 2014-10-15 ENCOUNTER — Emergency Department (HOSPITAL_COMMUNITY): Payer: Medicaid Other

## 2014-10-15 ENCOUNTER — Inpatient Hospital Stay (HOSPITAL_COMMUNITY)
Admission: EM | Admit: 2014-10-15 | Discharge: 2014-10-16 | DRG: 281 | Disposition: A | Discharge status: Home / Self Care | Payer: Medicaid Other | Attending: Internal Medicine | Admitting: Internal Medicine

## 2014-10-15 DIAGNOSIS — I129 Hypertensive chronic kidney disease with stage 1 through stage 4 chronic kidney disease, or unspecified chronic kidney disease: Secondary | ICD-10-CM | POA: Diagnosis present

## 2014-10-15 DIAGNOSIS — D509 Iron deficiency anemia, unspecified: Secondary | ICD-10-CM | POA: Diagnosis present

## 2014-10-15 DIAGNOSIS — F419 Anxiety disorder, unspecified: Secondary | ICD-10-CM | POA: Diagnosis present

## 2014-10-15 DIAGNOSIS — K5790 Diverticulosis of intestine, part unspecified, without perforation or abscess without bleeding: Secondary | ICD-10-CM | POA: Diagnosis present

## 2014-10-15 DIAGNOSIS — I255 Ischemic cardiomyopathy: Secondary | ICD-10-CM | POA: Diagnosis present

## 2014-10-15 DIAGNOSIS — N183 Chronic kidney disease, stage 3 unspecified: Secondary | ICD-10-CM | POA: Diagnosis present

## 2014-10-15 DIAGNOSIS — R079 Chest pain, unspecified: Secondary | ICD-10-CM

## 2014-10-15 DIAGNOSIS — I252 Old myocardial infarction: Secondary | ICD-10-CM

## 2014-10-15 DIAGNOSIS — L409 Psoriasis, unspecified: Secondary | ICD-10-CM | POA: Diagnosis present

## 2014-10-15 DIAGNOSIS — I480 Paroxysmal atrial fibrillation: Secondary | ICD-10-CM | POA: Diagnosis present

## 2014-10-15 DIAGNOSIS — I5023 Acute on chronic systolic (congestive) heart failure: Secondary | ICD-10-CM | POA: Diagnosis present

## 2014-10-15 DIAGNOSIS — I5022 Chronic systolic (congestive) heart failure: Secondary | ICD-10-CM | POA: Diagnosis present

## 2014-10-15 DIAGNOSIS — K2980 Duodenitis without bleeding: Secondary | ICD-10-CM | POA: Diagnosis present

## 2014-10-15 DIAGNOSIS — F329 Major depressive disorder, single episode, unspecified: Secondary | ICD-10-CM | POA: Diagnosis present

## 2014-10-15 DIAGNOSIS — K2950 Unspecified chronic gastritis without bleeding: Secondary | ICD-10-CM | POA: Diagnosis present

## 2014-10-15 DIAGNOSIS — Z79899 Other long term (current) drug therapy: Secondary | ICD-10-CM

## 2014-10-15 DIAGNOSIS — I10 Essential (primary) hypertension: Secondary | ICD-10-CM | POA: Diagnosis present

## 2014-10-15 DIAGNOSIS — Z95810 Presence of automatic (implantable) cardiac defibrillator: Secondary | ICD-10-CM | POA: Diagnosis present

## 2014-10-15 DIAGNOSIS — R778 Other specified abnormalities of plasma proteins: Secondary | ICD-10-CM

## 2014-10-15 DIAGNOSIS — Z8249 Family history of ischemic heart disease and other diseases of the circulatory system: Secondary | ICD-10-CM

## 2014-10-15 DIAGNOSIS — Z885 Allergy status to narcotic agent status: Secondary | ICD-10-CM

## 2014-10-15 DIAGNOSIS — E785 Hyperlipidemia, unspecified: Secondary | ICD-10-CM | POA: Diagnosis present

## 2014-10-15 DIAGNOSIS — D649 Anemia, unspecified: Secondary | ICD-10-CM | POA: Diagnosis present

## 2014-10-15 DIAGNOSIS — Z880 Allergy status to penicillin: Secondary | ICD-10-CM

## 2014-10-15 DIAGNOSIS — R7989 Other specified abnormal findings of blood chemistry: Secondary | ICD-10-CM

## 2014-10-15 DIAGNOSIS — I69354 Hemiplegia and hemiparesis following cerebral infarction affecting left non-dominant side: Secondary | ICD-10-CM

## 2014-10-15 DIAGNOSIS — Z7901 Long term (current) use of anticoagulants: Secondary | ICD-10-CM

## 2014-10-15 DIAGNOSIS — I2510 Atherosclerotic heart disease of native coronary artery without angina pectoris: Secondary | ICD-10-CM | POA: Diagnosis present

## 2014-10-15 DIAGNOSIS — K219 Gastro-esophageal reflux disease without esophagitis: Secondary | ICD-10-CM | POA: Diagnosis present

## 2014-10-15 DIAGNOSIS — Z720 Tobacco use: Secondary | ICD-10-CM | POA: Diagnosis present

## 2014-10-15 DIAGNOSIS — F1021 Alcohol dependence, in remission: Secondary | ICD-10-CM | POA: Diagnosis present

## 2014-10-15 DIAGNOSIS — F17210 Nicotine dependence, cigarettes, uncomplicated: Secondary | ICD-10-CM | POA: Diagnosis present

## 2014-10-15 DIAGNOSIS — E039 Hypothyroidism, unspecified: Secondary | ICD-10-CM | POA: Diagnosis present

## 2014-10-15 DIAGNOSIS — I214 Non-ST elevation (NSTEMI) myocardial infarction: Principal | ICD-10-CM | POA: Diagnosis present

## 2014-10-15 LAB — BASIC METABOLIC PANEL
ANION GAP: 6 (ref 5–15)
BUN: 21 mg/dL — ABNORMAL HIGH (ref 6–20)
CHLORIDE: 109 mmol/L (ref 101–111)
CO2: 28 mmol/L (ref 22–32)
Calcium: 9 mg/dL (ref 8.9–10.3)
Creatinine, Ser: 1.31 mg/dL — ABNORMAL HIGH (ref 0.44–1.00)
GFR calc Af Amer: 51 mL/min — ABNORMAL LOW (ref >60)
GFR, EST NON AFRICAN AMERICAN: 44 mL/min — AB (ref >60)
Glucose, Bld: 105 mg/dL — ABNORMAL HIGH (ref 65–99)
POTASSIUM: 3.6 mmol/L (ref 3.5–5.1)
SODIUM: 143 mmol/L (ref 135–145)

## 2014-10-15 LAB — CBC WITH DIFFERENTIAL/PLATELET
BASOS ABS: 0.1 10*3/uL (ref 0.0–0.1)
Basophils Relative: 1 %
Eosinophils Absolute: 0.2 10*3/uL (ref 0.0–0.7)
Eosinophils Relative: 4 %
HEMATOCRIT: 34.8 % — AB (ref 36.0–46.0)
HEMOGLOBIN: 11.7 g/dL — AB (ref 12.0–15.0)
LYMPHS ABS: 1.7 10*3/uL (ref 0.7–4.0)
LYMPHS PCT: 28 %
MCH: 29.6 pg (ref 26.0–34.0)
MCHC: 33.6 g/dL (ref 30.0–36.0)
MCV: 88.1 fL (ref 78.0–100.0)
Monocytes Absolute: 0.6 10*3/uL (ref 0.1–1.0)
Monocytes Relative: 10 %
NEUTROS ABS: 3.6 10*3/uL (ref 1.7–7.7)
Neutrophils Relative %: 57 %
Platelets: 184 10*3/uL (ref 150–400)
RBC: 3.95 MIL/uL (ref 3.87–5.11)
RDW: 14.5 % (ref 11.5–15.5)
WBC: 6.3 10*3/uL (ref 4.0–10.5)

## 2014-10-15 LAB — TROPONIN I
Troponin I: 0.03 ng/mL (ref <0.031)
Troponin I: 0.07 ng/mL — ABNORMAL HIGH (ref <0.031)

## 2014-10-15 NOTE — ED Notes (Signed)
Pt complain of mid sternal chest pain that radiates to the left shoulder. Pt took two NTG and EMS giver pt 324 mg ASA prior to arrival

## 2014-10-15 NOTE — ED Provider Notes (Signed)
CSN: [id number]     Arrival date & time 10/15/14  1836 History   First MD Initiated Contact with Patient 10/15/14 1838     Chief Complaint  Patient presents with  . Chest Pain     (Consider location/radiation/quality/duration/timing/severity/associated sxs/prior Treatment) HPI..... Left upper chest pain with radiation to neck and left arm since 545 pm with associated dyspnea and diaphoresis. Patient has extensive cardiac history including ischemic cardiomyopathy, heart failure, CAD, internal cardiac defibrillator.  She took 2 nitroglycerin and aspirin prior to arrival which helped some. Severity of pain is moderate.  Past Medical History  Diagnosis Date  . Diverticulosis   . Pancreatitis 1980, 2015    in the setting of ETOH  . chronic systolic heart failure     a. ECHO (05/08/13): EF 20-25%, diff HK with akinesis of basal inferior wall, grade 1 DD, trivial MR, trivial TR b) RHC (05/06/13): RA 7, RV 30/2, PA 31/19 (24), PCWP 10, Fick CO/CI: 2.9/1.74, Thermo CO/Ci: 2.4/1.4, PA sat 53%  . Ischemic cardiomyopathy   . CAD (coronary artery disease)     a. LHC (04/2013): Chronically occluded LAD, moderate dz in large OM1 and severe dz and small posterior lateral vessel    . Persistent atrial fibrillation 2015  . Suicidal overdose 10/03/13    Overdoes on Beta Blockers & Xarelto [facility] ED)  . Anxiety 2015   . Psoriasis 1968  . GERD (gastroesophageal reflux disease) 2015   . Hyperlipidemia 2015  . Hypertension 2015  . ETOH abuse 1981    started abusing alcohol at age 26, quit   . Hypothyroidism   . PONV (postoperative nausea and vomiting)   . Mass of right breast on mammogram 07/14/2014  . AICD (automatic cardioverter/defibrillator) present   . Myocardial infarction     "I've had many" (09/13/2014)  . Anginal pain   . Anemia   . History of blood transfusion     "related to losing blood from somewhere; never found out from where"  . Stroke 2011    left side weakness, minimal  . Depression    . 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/2014   Past Surgical History  Procedure Laterality Date  . Colostomy takedown  2007  . Abdominal exploration surgery  2007  . Esophagogastroduodenoscopy (egd) with propofol N/A 11/22/2013    Dr. Oneida Alar: gastritis and duodenitis, negative H.pylori  . Esophageal biopsy N/A 11/22/2013    Procedure: GASTRIC BIOPSIES;  Surgeon: Danie Binder, MD;  Location: [facility] ORS;  Service: Endoscopy;  Laterality: N/A;  . Left and right heart catheterization with coronary angiogram N/A 05/10/2013    Procedure: LEFT AND RIGHT HEART CATHETERIZATION WITH CORONARY ANGIOGRAM;  Surgeon: [name], MD;  Location: [facility] CATH LAB;  Service: Cardiovascular;  Laterality: N/A;  . Left heart catheterization with coronary angiogram N/A 09/03/2013    Procedure: LEFT HEART CATHETERIZATION WITH CORONARY ANGIOGRAM;  Surgeon: Sinclair Grooms, MD;  Location: [facility] CATH LAB;  Service: Cardiovascular;  Laterality: N/A;  . Colonoscopy with propofol N/A 03/29/2014    SLF: 1. No definite source for anemia identified 2. 8 colon polyps removed 3. Severe diverticulosis noted the transverse colon  and right colon 4. Small internal hemorrohids.   . Polypectomy N/A 03/29/2014    Procedure: POLYPECTOMY;  Surgeon: Danie Binder, MD;  Location: [facility] ORS;  Service: Endoscopy;  Laterality: N/A;  ascending colon, sigmoid colon x4, rectal x3  . Left and right heart catheterization with coronary angiogram  N/A 04/14/2014    Procedure: LEFT AND RIGHT HEART CATHETERIZATION WITH CORONARY ANGIOGRAM;  Surgeon: Larey Dresser, MD;  Location: [facility] CATH LAB;  Service: Cardiovascular;  Laterality: N/A;  . Agile capsule N/A 06/02/2014    Procedure: AGILE CAPSULE;  Surgeon: Danie Binder, MD;  Location: [facility] ENDO SUITE;  Service: Endoscopy;  Laterality: N/A;  0800  . Givens capsule study N/A 06/10/2014    Procedure: GIVENS CAPSULE STUDY;  Surgeon: Danie Binder, MD;  Location: [facility] ENDO SUITE;   Service: Endoscopy;  Laterality: N/A;  0700  . Ep implantable device N/A 09/13/2014    Procedure: ICD Implant;  Surgeon: [name], MD;  Location: [facility] LAB;  Service: Cardiovascular;  Laterality: N/A;  . Colostomy reversal  ?2009  . Ileostomy  ?2009  . Ileostomy closure  ?2010  . Colon surgery    . Cardiac catheterization    . Ep implantable device  09/13/14    ICD St Jude, placed    Family History  Problem Relation Age of Onset  . CAD Father 49    MI  . CAD Mother 67    MI  . Alcohol abuse Mother   . CAD Sister 42    Stent  . Diabetes Sister   . CAD Brother 25    MI  . Cancer Neg Hx   . Colon cancer Mother     in her 28s   Social History  Substance Use Topics  . Smoking status: Current Some Day Smoker -- 0.12 packs/day for 48 years    Types: Cigarettes  . Smokeless tobacco: Never Used  . Alcohol Use: No     Comment: last drink in 08/1015, former alcoholic   OB History    No data available     Review of Systems  All other systems reviewed and are negative.     Allergies  Morphine and related and Penicillins  Home Medications   Prior to Admission medications   Medication Sig Start Date End Date Taking? Authorizing Dwan Fennel  apixaban (ELIQUIS) 5 MG TABS tablet Take 1 tablet (5 mg total) by mouth 2 (two) times daily. 12/30/13  Yes Amy D Clegg, NP  atorvastatin (LIPITOR) 80 MG tablet Take 1 tablet (80 mg total) by mouth daily. 05/06/14  Yes Larey Dresser, MD  carvedilol (COREG) 6.25 MG tablet Take 1 tablet (6.25 mg total) by mouth 2 (two) times daily with a meal. Hold for heart rate less than 60 12/30/13  Yes Amy D Clegg, NP  furosemide (LASIX) 20 MG tablet Take 1 tablet (20 mg total) by mouth every other day. 05/06/14  Yes Larey Dresser, MD  levothyroxine (SYNTHROID, LEVOTHROID) 25 MCG tablet Take 0.125 mcg by mouth daily before breakfast.   Yes Historical Leslye Puccini, MD  lisinopril (PRINIVIL,ZESTRIL) 5 MG tablet Take 1 tablet (5 mg total) by mouth  daily. 05/06/14  Yes Larey Dresser, MD  LORazepam (ATIVAN) 0.5 MG tablet Take 1 tablet (0.5 mg total) by mouth 2 (two) times daily. 12/30/13  Yes Josalyn Funches, MD  nitroGLYCERIN (NITROSTAT) 0.3 MG SL tablet Place 0.3 mg under the tongue every 5 (five) minutes as needed for chest pain (MAX 3 TABLETS).    Yes Historical Reisha Wos, MD  pantoprazole (PROTONIX) 40 MG tablet TAKE 1 TABLET BY MOUTH TWICE DAILY BEFORE A MEAL. 02/25/14  Yes Carlis Stable, NP  sertraline (ZOLOFT) 50 MG tablet Take 1 tablet (50 mg total) by mouth daily. 12/30/13  Yes Josalyn Funches,  MD  spironolactone (ALDACTONE) 25 MG tablet Take 0.5 tablets (12.5 mg total) by mouth daily. Patient taking differently: Take 25 mg by mouth daily.  05/06/14  Yes Larey Dresser, MD   BP 131/73 mmHg  Pulse 58  Temp(Src) 98 F (36.7 C) (Oral)  Resp 19  Ht 5\' 5"  (1.651 m)  Wt 121 lb (54.885 kg)  BMI 20.14 kg/m2  SpO2 96%  LMP 01/22/1988 (Approximate) Physical Exam  Constitutional: She is oriented to person, place, and time. She appears well-developed and well-nourished.  HENT:  Head: Normocephalic and atraumatic.  Eyes: Conjunctivae and EOM are normal. Pupils are equal, round, and reactive to light.  Neck: Normal range of motion. Neck supple.  Cardiovascular: Normal rate and regular rhythm.   Pulmonary/Chest: Effort normal and breath sounds normal.  Abdominal: Soft. Bowel sounds are normal.  Musculoskeletal: Normal range of motion.  Neurological: She is alert and oriented to person, place, and time.  Skin: Skin is warm and dry.  Psychiatric: She has a normal mood and affect. Her behavior is normal.  Nursing note and vitals reviewed.   ED Course  Procedures (including critical care time) Labs Review Labs Reviewed  BASIC METABOLIC PANEL - Abnormal; Notable for the following:    Glucose, Bld 105 (*)    BUN 21 (*)    Creatinine, Ser 1.31 (*)    GFR calc non Af Amer 44 (*)    GFR calc Af Amer 51 (*)    All other components  within normal limits  CBC WITH DIFFERENTIAL/PLATELET - Abnormal; Notable for the following:    Hemoglobin 11.7 (*)    HCT 34.8 (*)    All other components within normal limits  TROPONIN I - Abnormal; Notable for the following:    Troponin I 0.07 (*)    All other components within normal limits  TROPONIN I    Imaging Review Dg Chest Port 1 View  10/15/2014   CLINICAL DATA:  Patient with left-sided chest pain. Cough and congestion.  EXAM: PORTABLE CHEST 1 VIEW  COMPARISON:  Chest radiograph 09/14/2014  FINDINGS: Multiple monitoring leads overlie the patient. Single lead AICD device overlies the left hemi thorax, leads are stable in position. Stable cardiac and mediastinal contours. No consolidative pulmonary opacities. No pleural effusion or pneumothorax. Regional skeleton is unremarkable.  IMPRESSION: No acute cardiopulmonary process.   Electronically Signed   By: Lovey Newcomer M.D.   On: 10/15/2014 19:14   I have personally reviewed and evaluated these images and lab results as part of my medical decision-making.   EKG Interpretation None      Date: 10/15/2014  Rate: 106  Rhythm: sinus tachy  QRS Axis: normal  Intervals: normal  ST/T Wave abnormalities: normal  Conduction Disutrbances: IVCD  Narrative Interpretation: unremarkable    MDM   Final diagnoses:  Chest pain, unspecified chest pain type  Elevated troponin    Patient is hemodynamically stable. Her delta troponin has risen to 0.07. Discussed with Dr. Loleta Books.  Will transfer to [facility].    Nat Christen, MD 10/15/14 [phone]

## 2014-10-16 ENCOUNTER — Encounter (HOSPITAL_COMMUNITY): Payer: Self-pay | Admitting: Family Medicine

## 2014-10-16 DIAGNOSIS — Z885 Allergy status to narcotic agent status: Secondary | ICD-10-CM | POA: Diagnosis not present

## 2014-10-16 DIAGNOSIS — D649 Anemia, unspecified: Secondary | ICD-10-CM | POA: Diagnosis present

## 2014-10-16 DIAGNOSIS — N183 Chronic kidney disease, stage 3 (moderate): Secondary | ICD-10-CM

## 2014-10-16 DIAGNOSIS — D509 Iron deficiency anemia, unspecified: Secondary | ICD-10-CM | POA: Diagnosis present

## 2014-10-16 DIAGNOSIS — Z720 Tobacco use: Secondary | ICD-10-CM

## 2014-10-16 DIAGNOSIS — R079 Chest pain, unspecified: Secondary | ICD-10-CM | POA: Diagnosis not present

## 2014-10-16 DIAGNOSIS — E039 Hypothyroidism, unspecified: Secondary | ICD-10-CM | POA: Diagnosis present

## 2014-10-16 DIAGNOSIS — D508 Other iron deficiency anemias: Secondary | ICD-10-CM | POA: Diagnosis not present

## 2014-10-16 DIAGNOSIS — K219 Gastro-esophageal reflux disease without esophagitis: Secondary | ICD-10-CM | POA: Diagnosis present

## 2014-10-16 DIAGNOSIS — E785 Hyperlipidemia, unspecified: Secondary | ICD-10-CM | POA: Diagnosis present

## 2014-10-16 DIAGNOSIS — I5022 Chronic systolic (congestive) heart failure: Secondary | ICD-10-CM

## 2014-10-16 DIAGNOSIS — I255 Ischemic cardiomyopathy: Secondary | ICD-10-CM | POA: Diagnosis present

## 2014-10-16 DIAGNOSIS — Z8249 Family history of ischemic heart disease and other diseases of the circulatory system: Secondary | ICD-10-CM | POA: Diagnosis not present

## 2014-10-16 DIAGNOSIS — I129 Hypertensive chronic kidney disease with stage 1 through stage 4 chronic kidney disease, or unspecified chronic kidney disease: Secondary | ICD-10-CM | POA: Diagnosis present

## 2014-10-16 DIAGNOSIS — F1021 Alcohol dependence, in remission: Secondary | ICD-10-CM | POA: Diagnosis present

## 2014-10-16 DIAGNOSIS — Z7901 Long term (current) use of anticoagulants: Secondary | ICD-10-CM | POA: Diagnosis not present

## 2014-10-16 DIAGNOSIS — I10 Essential (primary) hypertension: Secondary | ICD-10-CM

## 2014-10-16 DIAGNOSIS — Z95810 Presence of automatic (implantable) cardiac defibrillator: Secondary | ICD-10-CM

## 2014-10-16 DIAGNOSIS — K2950 Unspecified chronic gastritis without bleeding: Secondary | ICD-10-CM | POA: Diagnosis present

## 2014-10-16 DIAGNOSIS — I252 Old myocardial infarction: Secondary | ICD-10-CM | POA: Diagnosis not present

## 2014-10-16 DIAGNOSIS — I214 Non-ST elevation (NSTEMI) myocardial infarction: Principal | ICD-10-CM

## 2014-10-16 DIAGNOSIS — I2510 Atherosclerotic heart disease of native coronary artery without angina pectoris: Secondary | ICD-10-CM | POA: Diagnosis present

## 2014-10-16 DIAGNOSIS — F329 Major depressive disorder, single episode, unspecified: Secondary | ICD-10-CM | POA: Diagnosis present

## 2014-10-16 DIAGNOSIS — Z79899 Other long term (current) drug therapy: Secondary | ICD-10-CM | POA: Diagnosis not present

## 2014-10-16 DIAGNOSIS — K2980 Duodenitis without bleeding: Secondary | ICD-10-CM | POA: Diagnosis present

## 2014-10-16 DIAGNOSIS — L409 Psoriasis, unspecified: Secondary | ICD-10-CM | POA: Diagnosis present

## 2014-10-16 DIAGNOSIS — R7989 Other specified abnormal findings of blood chemistry: Secondary | ICD-10-CM

## 2014-10-16 DIAGNOSIS — F419 Anxiety disorder, unspecified: Secondary | ICD-10-CM | POA: Diagnosis present

## 2014-10-16 DIAGNOSIS — F17210 Nicotine dependence, cigarettes, uncomplicated: Secondary | ICD-10-CM | POA: Diagnosis present

## 2014-10-16 DIAGNOSIS — I69354 Hemiplegia and hemiparesis following cerebral infarction affecting left non-dominant side: Secondary | ICD-10-CM | POA: Diagnosis not present

## 2014-10-16 DIAGNOSIS — I480 Paroxysmal atrial fibrillation: Secondary | ICD-10-CM | POA: Diagnosis present

## 2014-10-16 DIAGNOSIS — K5790 Diverticulosis of intestine, part unspecified, without perforation or abscess without bleeding: Secondary | ICD-10-CM | POA: Diagnosis present

## 2014-10-16 DIAGNOSIS — Z880 Allergy status to penicillin: Secondary | ICD-10-CM | POA: Diagnosis not present

## 2014-10-16 LAB — TROPONIN I
TROPONIN I: 0.05 ng/mL — AB (ref <0.031)
Troponin I: 0.07 ng/mL — ABNORMAL HIGH (ref <0.031)
Troponin I: 0.06 ng/mL — ABNORMAL HIGH (ref <0.031)

## 2014-10-16 MED ORDER — LORAZEPAM 0.5 MG PO TABS
0.5000 mg | ORAL_TABLET | Freq: Once | ORAL | Status: AC
  Administered 2014-10-16: 0.5 mg via ORAL
  Filled 2014-10-16: qty 1

## 2014-10-16 MED ORDER — ACETAMINOPHEN 325 MG PO TABS: 650.0000 mg | ORAL_TABLET | ORAL | Status: DC | PRN

## 2014-10-16 MED ORDER — LORAZEPAM 0.5 MG PO TABS
0.5000 mg | ORAL_TABLET | Freq: Two times a day (BID) | ORAL | Status: DC
  Filled 2014-10-16: qty 1

## 2014-10-16 MED ORDER — CARVEDILOL 6.25 MG PO TABS
6.2500 mg | ORAL_TABLET | Freq: Two times a day (BID) | ORAL | Status: DC
  Administered 2014-10-16: 6.25 mg via ORAL
  Filled 2014-10-16 (×2): qty 1

## 2014-10-16 MED ORDER — NITROGLYCERIN 0.4 MG SL SUBL
SUBLINGUAL_TABLET | SUBLINGUAL | Status: AC
  Filled 2014-10-16: qty 1

## 2014-10-16 MED ORDER — PANTOPRAZOLE SODIUM 40 MG PO TBEC
40.0000 mg | DELAYED_RELEASE_TABLET | Freq: Two times a day (BID) | ORAL | Status: DC
  Administered 2014-10-16 (×2): 40 mg via ORAL
  Filled 2014-10-16 (×2): qty 1

## 2014-10-16 MED ORDER — SPIRONOLACTONE 25 MG PO TABS
12.5000 mg | ORAL_TABLET | Freq: Every day | ORAL | Status: DC
  Administered 2014-10-16: 12.5 mg via ORAL
  Filled 2014-10-16: qty 1

## 2014-10-16 MED ORDER — ONDANSETRON HCL 4 MG/2ML IJ SOLN
4.0000 mg | Freq: Four times a day (QID) | INTRAMUSCULAR | Status: DC | PRN
Start: 2014-10-16 — End: 2014-10-16

## 2014-10-16 MED ORDER — ATORVASTATIN CALCIUM 80 MG PO TABS
80.0000 mg | ORAL_TABLET | Freq: Every day | ORAL | Status: DC
  Administered 2014-10-16: 80 mg via ORAL
  Filled 2014-10-16: qty 1

## 2014-10-16 MED ORDER — SERTRALINE HCL 50 MG PO TABS
50.0000 mg | ORAL_TABLET | Freq: Every day | ORAL | Status: DC
  Administered 2014-10-16: 50 mg via ORAL
  Filled 2014-10-16: qty 1

## 2014-10-16 MED ORDER — APIXABAN 5 MG PO TABS
5.0000 mg | ORAL_TABLET | Freq: Two times a day (BID) | ORAL | Status: DC
  Administered 2014-10-16: 5 mg via ORAL
  Filled 2014-10-16 (×2): qty 1

## 2014-10-16 MED ORDER — NITROGLYCERIN 0.4 MG SL SUBL
0.4000 mg | SUBLINGUAL_TABLET | SUBLINGUAL | Status: DC | PRN
  Administered 2014-10-16: 0.4 mg via SUBLINGUAL

## 2014-10-16 MED ORDER — FUROSEMIDE 20 MG PO TABS
20.0000 mg | ORAL_TABLET | ORAL | Status: DC
  Administered 2014-10-16: 20 mg via ORAL
  Filled 2014-10-16: qty 1

## 2014-10-16 MED ORDER — LISINOPRIL 5 MG PO TABS
5.0000 mg | ORAL_TABLET | Freq: Every day | ORAL | Status: DC
  Administered 2014-10-16: 5 mg via ORAL
  Filled 2014-10-16: qty 1

## 2014-10-16 MED ORDER — LEVOTHYROXINE SODIUM 25 MCG PO TABS
12.5000 ug | ORAL_TABLET | Freq: Every day | ORAL | Status: DC
  Administered 2014-10-16: 12.5 ug via ORAL
  Filled 2014-10-16: qty 1

## 2014-10-16 MED ORDER — GI COCKTAIL ~~LOC~~: 30.0000 mL | Freq: Four times a day (QID) | ORAL | Status: DC | PRN

## 2014-10-16 NOTE — Plan of Care (Signed)
Problem: Consults Goal: Chest Pain Patient Education (See Patient Education module for education specifics.) Outcome: Progressing Instructed pt on the importance of notifying RN at onset of CP

## 2014-10-16 NOTE — Discharge Summary (Signed)
Physician Discharge Summary  [name] KGM:[id number] DOB: 01-23-1955 DOA: 10/15/2014  PCP: [name], MD  Admit date: 10/15/2014 Discharge date: 10/16/2014  Time spent: 35 minutes  Recommendations for Outpatient Follow-up:  1. Outpatient stress test  Discharge Diagnoses:  Principal Problem:   NSTEMI (non-ST elevated myocardial infarction) Active Problems:   Hypertension   GERD (gastroesophageal reflux disease)   Tobacco abuse   Chronic systolic CHF (congestive heart failure)   PAF (paroxysmal atrial fibrillation)   Cardiomyopathy, ischemic- EF 25-30% 2D 09/02/13   CKD (chronic kidney disease) stage 3, GFR 30-59 ml/min   Hypothyroidism   Anemia   S/P ICD (internal cardiac defibrillator) procedure, St. Jude device, 09/13/14   Discharge Condition: improved  Diet recommendation: cardiac  Filed Weights   10/15/14 1842 10/16/14 0228  Weight: 54.885 kg (121 lb) 56.7 kg (125 lb)    History of present illness:  [name] is a 59 y.o. female with a past medical history significant for ischemic cardiomyopathy with ejection fraction 20-25% and recently placed ICD, ASCVD medically managed, atrial fibrillation on request, chronic iron deficiency anemia from duodenitis, hypertension, smoking, and family history of premature cardiovascular disease who presents with 6 hours of substernal chest pain radiating to the left shoulder.  The patient was in her usual state of health until tonight around 6 PM when she noted the onset of moderate substernal chest discomfort and "pressure" that radiated to her left shoulder and arm and was associated with diaphoresis and mild dyspnea. The patient took a nitroglycerin which didn't help and so she came to the ER. She did not have dizziness, lightheadedness, palpitations, or leg swelling.  In the ED, a second nitroglycerin reduced her pain somewhat. An EKG showed sinus tachycardia with a recurrent intraventricular conduction delay and QRSd 152  ms. Her initial troponin was negative but her second troponin was more than twice the upper limit of normal. She was admitted and transferred to [facility] for cardiology evaluation.  Hospital Course:  1. Chest pain with mildly positive trop - trop 0.03 --> 0.07 --> 0.07 --> 0.06 - last cath March 2016 90% complex ostial stenosis of PLV branch, large OM1 with 40-50% ostial stenosis, 30% moderate ramus stenosis, totally occluded LAD ostially with some collateral from RCA - outpatient stress  2. CAD with known chronically occluded LAD  3. ischemic cardiomyopathy, with EF 25% s/p St Jude Single Chamber ICD 08/2014 by Dr. Rayann Heman   4. chronic systolic heart failure: no HF symptom on exam  5. history of persistent A. fib now in sinus rhythm - on eliquis and coreg, no longer on amio  6. HTN 7. HLD 8. Stroke 9. H/o EtOH abuse 10. history of suicidal attempt 09/2013 overdosed on HF med include BB and xarelto   Procedures:    Consultations:  cards  Discharge Exam: Filed Vitals:   10/16/14 1125  BP: 141/71  Pulse:   Temp:   Resp:      Discharge Instructions   Discharge Instructions    Diet - low sodium heart healthy    Complete by:  As directed      Discharge instructions    Complete by:  As directed   BMP 1 week     Increase activity slowly    Complete by:  As directed           Current Discharge Medication List    CONTINUE these medications which have NOT CHANGED   Details  apixaban (ELIQUIS) 5 MG TABS tablet  Take 1 tablet (5 mg total) by mouth 2 (two) times daily. Qty: 60 tablet, Refills: 3    atorvastatin (LIPITOR) 80 MG tablet Take 1 tablet (80 mg total) by mouth daily. Qty: 30 tablet, Refills: 6    carvedilol (COREG) 6.25 MG tablet Take 1 tablet (6.25 mg total) by mouth 2 (two) times daily with a meal. Hold for heart rate less than 60 Qty: 60 tablet, Refills: 3    furosemide (LASIX) 20 MG tablet Take 1  tablet (20 mg total) by mouth every other day. Qty: 15 tablet, Refills: 6    levothyroxine (SYNTHROID, LEVOTHROID) 25 MCG tablet Take 0.125 mcg by mouth daily before breakfast.    lisinopril (PRINIVIL,ZESTRIL) 5 MG tablet Take 1 tablet (5 mg total) by mouth daily. Qty: 90 tablet, Refills: 3    LORazepam (ATIVAN) 0.5 MG tablet Take 1 tablet (0.5 mg total) by mouth 2 (two) times daily. Qty: 60 tablet, Refills: 2   Associated Diagnoses: Anxiety state    nitroGLYCERIN (NITROSTAT) 0.3 MG SL tablet Place 0.3 mg under the tongue every 5 (five) minutes as needed for chest pain (MAX 3 TABLETS).     pantoprazole (PROTONIX) 40 MG tablet TAKE 1 TABLET BY MOUTH TWICE DAILY BEFORE A MEAL. Qty: 60 tablet, Refills: 5    sertraline (ZOLOFT) 50 MG tablet Take 1 tablet (50 mg total) by mouth daily. Qty: 90 tablet, Refills: 1   Associated Diagnoses: Anxiety state    spironolactone (ALDACTONE) 25 MG tablet Take 0.5 tablets (12.5 mg total) by mouth daily. Qty: 15 tablet, Refills: 3       Allergies  Allergen Reactions  . Morphine And Related Other (See Comments)    Dizziness and hallucinations  . Penicillins Anaphylaxis and Rash   Follow-up Information    Follow up with [name], MD.   Specialty:  Cardiology   Why:  office will contact you.   Contact information:   [place] [address] [phone]       Follow up with [name], MD In 1 week.   Specialty:  Family Medicine   Contact information:   [place] [address] [phone]        The results of significant diagnostics from this hospitalization (including imaging, microbiology, ancillary and laboratory) are listed below for reference.    Significant Diagnostic Studies: Dg Chest Port 1 View  10/15/2014   CLINICAL DATA:  Patient with left-sided chest pain. Cough and congestion.  EXAM: PORTABLE CHEST 1 VIEW  COMPARISON:  Chest radiograph 09/14/2014  FINDINGS:  Multiple monitoring leads overlie the patient. Single lead AICD device overlies the left hemi thorax, leads are stable in position. Stable cardiac and mediastinal contours. No consolidative pulmonary opacities. No pleural effusion or pneumothorax. Regional skeleton is unremarkable.  IMPRESSION: No acute cardiopulmonary process.   Electronically Signed   By: Lovey Newcomer M.D.   On: 10/15/2014 19:14    Microbiology: No results found for this or any previous visit (from the past 240 hour(s)).   Labs: Basic Metabolic Panel:  Recent Labs Lab 10/15/14 1916  NA 143  K 3.6  CL 109  CO2 28  GLUCOSE 105*  BUN 21*  CREATININE 1.31*  CALCIUM 9.0   Liver Function Tests: No results for input(s): AST, ALT, ALKPHOS, BILITOT, PROT, ALBUMIN in the last 168 hours. No results for input(s): LIPASE, AMYLASE in the last 168 hours. No results for input(s): AMMONIA in the last 168 hours.  CBC:  Recent Labs Lab 10/15/14 1916  WBC 6.3  NEUTROABS 3.6  HGB 11.7*  HCT 34.8*  MCV 88.1  PLT 184   Cardiac Enzymes:  Recent Labs Lab 10/15/14 1916 10/15/14 2214 10/16/14 0326 10/16/14 0739 10/16/14 0920  TROPONINI 0.03 0.07* 0.07* 0.06* 0.05*   BNP: BNP (last 3 results)  Recent Labs  04/09/14 0038  BNP 1385.0*    ProBNP (last 3 results) No results for input(s): PROBNP in the last 8760 hours.  CBG: No results for input(s): GLUCAP in the last 168 hours.     SignedEulogio Bear  [facility] 10/16/2014, 4:21 PM

## 2014-10-16 NOTE — Progress Notes (Signed)
Patient discharged home per MD order, all discharge instructions gone over, patient verbalized understanding.

## 2014-10-16 NOTE — H&P (Signed)
History and Physical  [name]  JEH:[id number]  DOB: 1955-06-01  DOA: 10/15/2014  Referring physician: Nat Christen, MD PCP: [name], MD   Chief Complaint: "I had chest pain starting this evening but didn't go away with nitroglycerin."  HPI: [name] is a 59 y.o. female with a past medical history significant for ischemic cardiomyopathy with ejection fraction 20-25% and recently placed ICD, ASCVD medically managed, atrial fibrillation on request, chronic iron deficiency anemia from duodenitis, hypertension, smoking, and family history of premature cardiovascular disease who presents with 6 hours of substernal chest pain radiating to the left shoulder.  The patient was in her usual state of health until tonight around 6 PM when she noted the onset of moderate substernal chest discomfort and "pressure" that radiated to her left shoulder and arm and was associated with diaphoresis and mild dyspnea. The patient took a nitroglycerin which didn't help and so she came to the ER. She did not have dizziness, lightheadedness, palpitations, or leg swelling.  In the ED, a second nitroglycerin reduced her pain somewhat. An EKG showed sinus tachycardia with a recurrent intraventricular conduction delay and QRSd 152 ms. Her initial troponin was negative but her second troponin was more than twice the upper limit of normal. She was admitted and transferred to [facility] for cardiology evaluation.   Review of Systems:  Patient seen 12:15 AM on 10/16/2014. Pt complains of chest pressure, sweats, radiation of the neck, mild dyspnea, exertional symptoms. Otherwise, twelve systems were reviewed and were negative except as noted above in the history of present illness.  Past Medical History  Diagnosis Date  . Diverticulosis   . Pancreatitis 1980, 2015    in the setting of ETOH  . chronic systolic heart failure     a. ECHO (05/08/13): EF 20-25%, diff HK with akinesis of basal inferior wall,  grade 1 DD, trivial MR, trivial TR b) RHC (05/06/13): RA 7, RV 30/2, PA 31/19 (24), PCWP 10, Fick CO/CI: 2.9/1.74, Thermo CO/Ci: 2.4/1.4, PA sat 53%  . Ischemic cardiomyopathy   . CAD (coronary artery disease)     a. LHC (04/2013): Chronically occluded LAD, moderate dz in large OM1 and severe dz and small posterior lateral vessel    . Persistent atrial fibrillation 2015  . Suicidal overdose 10/03/13    Overdoes on Beta Blockers & Xarelto [facility] ED)  . Anxiety 2015   . Psoriasis 1968  . GERD (gastroesophageal reflux disease) 2015   . Hyperlipidemia 2015  . Hypertension 2015  . ETOH abuse 1981    started abusing alcohol at age 33, quit   . Hypothyroidism   . PONV (postoperative nausea and vomiting)   . Mass of right breast on mammogram 07/14/2014  . AICD (automatic cardioverter/defibrillator) present   . Myocardial infarction     "I've had many" (09/13/2014)  . Anginal pain   . Anemia   . History of blood transfusion     "related to losing blood from somewhere; never found out from where"  . Stroke 2011    left side weakness, minimal  . Depression   . 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/2014  The above past medical history was reviewed.  Past Surgical History  Procedure Laterality Date  . Colostomy takedown  2007  . Abdominal exploration surgery  2007  . Esophagogastroduodenoscopy (egd) with propofol N/A 11/22/2013    Dr. Oneida Alar: gastritis and duodenitis, negative H.pylori  . Esophageal biopsy N/A 11/22/2013    Procedure:  GASTRIC BIOPSIES;  Surgeon: Danie Binder, MD;  Location: [facility] ORS;  Service: Endoscopy;  Laterality: N/A;  . Left and right heart catheterization with coronary angiogram N/A 05/10/2013    Procedure: LEFT AND RIGHT HEART CATHETERIZATION WITH CORONARY ANGIOGRAM;  Surgeon: [name], MD;  Location[facility] C[facility] LAB;  Service: Cardiovascular;  Laterality: N/A;  . Left heart catheterization with coronary angiogram N/A 09/03/2013     Procedure: LEFT HEART CATHETERIZATION WITH CORONARY ANGIOGRAM;  Surgeon: Sinclair Grooms, MD;  Location: [facility] CATH LAB;  Service: Cardiovascular;  Laterality: N/A;  . Colonoscopy with propofol N/A 03/29/2014    SLF: 1. No definite source for anemia identified 2. 8 colon polyps removed 3. Severe diverticulosis noted the transverse colon  and right colon 4. Small internal hemorrohids.   . Polypectomy N/A 03/29/2014    Procedure: POLYPECTOMY;  Surgeon: Danie Binder, MD;  Location: [facility] ORS;  Service: Endoscopy;  Laterality: N/A;  ascending colon, sigmoid colon x4, rectal x3  . Left and right heart catheterization with coronary angiogram N/A 04/14/2014    Procedure: LEFT AND RIGHT HEART CATHETERIZATION WITH CORONARY ANGIOGRAM;  Surgeon: Larey Dresser, MD;  Location: [facility] CATH LAB;  Service: Cardiovascular;  Laterality: N/A;  . Agile capsule N/A 06/02/2014    Procedure: AGILE CAPSULE;  Surgeon: Danie Binder, MD;  Location: [facility] ENDO SUITE;  Service: Endoscopy;  Laterality: N/A;  0800  . Givens capsule study N/A 06/10/2014    Procedure: GIVENS CAPSULE STUDY;  Surgeon: Danie Binder, MD;  Location: [facility] ENDO SUITE;  Service: Endoscopy;  Laterality: N/A;  0700  . Ep implantable device N/A 09/13/2014    Procedure: ICD Implant;  Surgeon: [name], MD;  Location: [place] CV LAB;  Service: Cardiovascular;  Laterality: N/A;  . Colostomy reversal  ?2009  . Ileostomy  ?2009  . Ileostomy closure  ?2010  . Colon surgery    . Cardiac catheterization    . Ep implantable device  09/13/14    ICD St Jude, placed   The above surgical history was reviewed.  Social History:  reports that she has been smoking Cigarettes.  She has a 5.76 pack-year smoking history. She has never used smokeless tobacco. She reports that she does not drink alcohol or use illicit drugs. Patient lives in a group home. She reports active daily smoking a few cigarettes per day. She denies alcohol. She is on disability.  Allergies  Allergen  Reactions  . Morphine And Related Other (See Comments)    Dizziness and hallucinations  . Penicillins Anaphylaxis and Rash    Family History  Problem Relation Age of Onset  . CAD Father 69    MI  . CAD Mother 88    MI  . Alcohol abuse Mother   . CAD Sister 49    Stent  . Diabetes Sister   . CAD Brother 75    MI  . Cancer Neg Hx   . Colon cancer Mother     in her 75s    Prior to Admission medications   Medication Sig Start Date End Date Taking? Authorizing Provider  apixaban (ELIQUIS) 5 MG TABS tablet Take 1 tablet (5 mg total) by mouth 2 (two) times daily. 12/30/13  Yes Amy D Clegg, NP  atorvastatin (LIPITOR) 80 MG tablet Take 1 tablet (80 mg total) by mouth daily. 05/06/14  Yes Larey Dresser, MD  carvedilol (COREG) 6.25 MG tablet Take 1 tablet (6.25 mg total) by mouth 2 (  two) times daily with a meal. Hold for heart rate less than 60 12/30/13  Yes Amy D Clegg, NP  furosemide (LASIX) 20 MG tablet Take 1 tablet (20 mg total) by mouth every other day. 05/06/14  Yes Larey Dresser, MD  levothyroxine (SYNTHROID, LEVOTHROID) 25 MCG tablet Take 0.125 mcg by mouth daily before breakfast.   Yes Historical Provider, MD  lisinopril (PRINIVIL,ZESTRIL) 5 MG tablet Take 1 tablet (5 mg total) by mouth daily. 05/06/14  Yes Larey Dresser, MD  LORazepam (ATIVAN) 0.5 MG tablet Take 1 tablet (0.5 mg total) by mouth 2 (two) times daily. 12/30/13  Yes Josalyn Funches, MD  nitroGLYCERIN (NITROSTAT) 0.3 MG SL tablet Place 0.3 mg under the tongue every 5 (five) minutes as needed for chest pain (MAX 3 TABLETS).    Yes Historical Provider, MD  pantoprazole (PROTONIX) 40 MG tablet TAKE 1 TABLET BY MOUTH TWICE DAILY BEFORE A MEAL. 02/25/14  Yes Carlis Stable, NP  sertraline (ZOLOFT) 50 MG tablet Take 1 tablet (50 mg total) by mouth daily. 12/30/13  Yes Josalyn Funches, MD  spironolactone (ALDACTONE) 25 MG tablet Take 0.5 tablets (12.5 mg total) by mouth daily. Patient taking differently: Take 25 mg by mouth  daily.  05/06/14  Yes Larey Dresser, MD    Physical Exam: BP 143/67 mmHg  Pulse 77  Temp(Src) 98 F (36.7 C) (Oral)  Resp 17  Ht 5\' 5"  (1.651 m)  Wt 54.885 kg (121 lb)  BMI 20.14 kg/m2  SpO2 98%  LMP 01/22/1988 (Approximate) General: Adult female, alert and in no acute distress.  Responds appropriately to questions.  Eye contact, dress and hygiene appropriate. HEENT: Head normal.  Corneas clear, conjunctivae and sclerae normal without injection or icterus, lids and lashes normal.  PERRL and EOMI.  Nose normal.  Edentulous.  OP moist without erythema, exudates, cobblestoning, or ulcers.  No airway deformities.   Skin: Warm and dry.  No jaundice.  No suspicious rashes or lesions. Cardiac: RRR, nl S1-S2, no murmurs appreciated.  Capillary refill is less than 2 seconds.  JVP normal.  No LE edema.  Radial and DP pulses 2+ and symmetric. Respiratory: Normal respiratory rate and rhythm.  CTAB without rales or wheezes. Abdomen: BS present.  No TTP or rebound all quadrants.  No masses or organomegaly. No ascites, distension. Neuro: Sensorium intact.  Cranial nerves 3-12 intact.  Speech is fluent.  Naming is grossly intact, and the patient's recall, recent and remote, as well as general fund of knowledge seem within normal limits.  Muscle bulk normal.  5/5 grip strength and elbow and wrist flexion/extension, symmetrically.  5/5 hip flexion symmetrically.  Moves all extremities equally and with normal coordination.             Labs on Admission:  The metabolic panel is notable for normal serum sodium, potassium, and bicarbonate. The serum creatinine is 1.31 mg/dL which is near the patient's baseline of 1.1-1.2 mg/dL. Her initial troponin was 0.03 ng/mL and rose to 0.07 ng/mL after 3 hours. The complete blood count is notable for chronic normocytic anemia, with hemoglobin 11.7 g/dL, and no evidence of leukocytosis or thrombocytopenia.    Radiological Exams on Admission: Personally  reviewed: Dg Chest [facility] 10/15/2014   No acute disease.    EKG: Independently reviewed. Sinus tachycardia, IVCD, recurrent from March.       Assessment/Plan Present on Admission:  . NSTEMI (non-ST elevated myocardial infarction) . Hypertension . GERD (gastroesophageal reflux disease) . Tobacco  abuse . Chronic systolic CHF (congestive heart failure) . PAF (paroxysmal atrial fibrillation) . Cardiomyopathy, ischemic- EF 25-30% 2D 09/02/13 . CKD (chronic kidney disease) stage 3, GFR 30-59 ml/min . Hypothyroidism . Anemia . S/P ICD (internal cardiac defibrillator) procedure, St. Jude device, 09/13/14   1. NSTEMI:  The patient is a 59 year old woman with known severe coronary disease, medically managed and ischemic cardiomyopathy with last cath in March after a few weeks of intermittent chest pain, which showed her known totally occluded LAD and known complex posterior left ventricle stenosis. The suspicion was that her symptoms at that time were related to worsening anemia.    I do not appreciate worsening anemia, she is not in atrial fibrillation, and I do not appreciate volume overload to account for her very slightly elevated troponin.  Her degree of kidney insufficiency is not enough to explain the troponin level either. Her GRACE score is low at 81 -Will admit for observation and continued trending of troponin -Consult cardiology, appreciate recommendations -The patient has received aspirin, and is already anticoagulated with apixaban -Telemetry -Sublingual nitroglycerin when necessary for pain  2. Ischemic cardiomyopathy: No evidence of heart failure. -Continue home atorvastatin, carvedilol, furosemide, lisinopril, and spironolactone  3. Paroxysmal atrial fibrillation: Currently in sinus. Continue apixaban, and carvedilol.  4. Chronic duodenitis and gastritis: Continue home pantoprazole  5. History of anxiety: She wonders if some of her chest pain wasn't provoked  by a roommate who is bipolar and has been acting up lately. Continue home sertraline, and lorazepam  DVT PPx: Apixaban  Diet: Cardiac  Consultants: Cardiology  Code Status: Full  Disposition Plan:  The appropriate admission status for this patient is INPATIENT. Inpatient status is judged to be reasonable and necessary in order to provide the required intensity of service to ensure the patient's safety. The patient's presenting symptoms, physical exam findings, and initial radiographic and laboratory data in the context of their chronic comorbidities is felt to place them at high risk for further clinical deterioration. Furthermore, it is not anticipated that the patient will be medically stable for discharge from the hospital within 2 midnights of admission. The following factors support the admission status of inpatient.   A. The patient's presenting symptoms include chest pressure, mild dyspnea, diaphoresis. B. The worrisome physical exam findings include tachycardia. C. The initial radiographic and laboratory data are worrisome because of elevated troponin. D. The chronic co-morbidities include ischemic cardiomyopathy with EF 20%, severe coronary disease, chronic kidney disease stage III, anxiety. E. Patient requires inpatient status due to high intensity of service, high risk for further deterioration and high frequency of surveillance required. F. I certify that at the point of admission it is my clinical judgment that the patient will require inpatient hospital care spanning beyond 2 midnights from the point of admission.     Edwin Dada [facility] Pager [phone]

## 2014-10-16 NOTE — Progress Notes (Signed)
Patient admitted after midnight, await cardiology recommendations Patient is currently chest pain free  Eulogio Bear DO
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
## 2014-10-16 NOTE — Progress Notes (Signed)
Utilization review completed.  

## 2014-10-19 ENCOUNTER — Encounter (HOSPITAL_COMMUNITY): Payer: Self-pay

## 2014-10-19 ENCOUNTER — Ambulatory Visit (HOSPITAL_COMMUNITY)
Admission: RE | Admit: 2014-10-19 | Discharge: 2014-10-19 | Disposition: A | Discharge status: Home / Self Care | Payer: Medicaid Other | Source: Ambulatory Visit | Attending: Internal Medicine | Admitting: Internal Medicine

## 2014-10-19 VITALS — BP 112/62 | HR 80 | Wt 122.8 lb

## 2014-10-19 DIAGNOSIS — I480 Paroxysmal atrial fibrillation: Secondary | ICD-10-CM | POA: Diagnosis not present

## 2014-10-19 DIAGNOSIS — F329 Major depressive disorder, single episode, unspecified: Secondary | ICD-10-CM | POA: Diagnosis not present

## 2014-10-19 DIAGNOSIS — E039 Hypothyroidism, unspecified: Secondary | ICD-10-CM | POA: Insufficient documentation

## 2014-10-19 DIAGNOSIS — I252 Old myocardial infarction: Secondary | ICD-10-CM | POA: Diagnosis not present

## 2014-10-19 DIAGNOSIS — F419 Anxiety disorder, unspecified: Secondary | ICD-10-CM | POA: Insufficient documentation

## 2014-10-19 DIAGNOSIS — E785 Hyperlipidemia, unspecified: Secondary | ICD-10-CM | POA: Diagnosis not present

## 2014-10-19 DIAGNOSIS — Z7902 Long term (current) use of antithrombotics/antiplatelets: Secondary | ICD-10-CM | POA: Insufficient documentation

## 2014-10-19 DIAGNOSIS — F17200 Nicotine dependence, unspecified, uncomplicated: Secondary | ICD-10-CM | POA: Insufficient documentation

## 2014-10-19 DIAGNOSIS — I10 Essential (primary) hypertension: Secondary | ICD-10-CM | POA: Diagnosis not present

## 2014-10-19 DIAGNOSIS — I2510 Atherosclerotic heart disease of native coronary artery without angina pectoris: Secondary | ICD-10-CM

## 2014-10-19 DIAGNOSIS — I255 Ischemic cardiomyopathy: Secondary | ICD-10-CM | POA: Diagnosis not present

## 2014-10-19 DIAGNOSIS — Z8673 Personal history of transient ischemic attack (TIA), and cerebral infarction without residual deficits: Secondary | ICD-10-CM | POA: Insufficient documentation

## 2014-10-19 DIAGNOSIS — Z79899 Other long term (current) drug therapy: Secondary | ICD-10-CM | POA: Insufficient documentation

## 2014-10-19 DIAGNOSIS — F1010 Alcohol abuse, uncomplicated: Secondary | ICD-10-CM | POA: Diagnosis not present

## 2014-10-19 DIAGNOSIS — K219 Gastro-esophageal reflux disease without esophagitis: Secondary | ICD-10-CM | POA: Diagnosis not present

## 2014-10-19 DIAGNOSIS — Z95810 Presence of automatic (implantable) cardiac defibrillator: Secondary | ICD-10-CM | POA: Insufficient documentation

## 2014-10-19 DIAGNOSIS — I5022 Chronic systolic (congestive) heart failure: Secondary | ICD-10-CM | POA: Insufficient documentation

## 2014-10-19 DIAGNOSIS — Z8249 Family history of ischemic heart disease and other diseases of the circulatory system: Secondary | ICD-10-CM | POA: Diagnosis not present

## 2014-10-19 DIAGNOSIS — L409 Psoriasis, unspecified: Secondary | ICD-10-CM | POA: Diagnosis not present

## 2014-10-19 DIAGNOSIS — D509 Iron deficiency anemia, unspecified: Secondary | ICD-10-CM | POA: Insufficient documentation

## 2014-10-19 MED ORDER — SACUBITRIL-VALSARTAN 24-26 MG PO TABS: 1.0000 | ORAL_TABLET | Freq: Two times a day (BID) | ORAL | Status: DC

## 2014-10-19 NOTE — Progress Notes (Signed)
Advanced Heart Failure Medication Review by a Pharmacist  Does the patient  feel that his/her medications are working for him/her?  yes  Has the patient been experiencing any side effects to the medications prescribed?  no  Does the patient measure his/her own blood pressure or blood glucose at home?  yes   Does the patient have any problems obtaining medications due to transportation or finances?   no  Understanding of regimen: good Understanding of indications: good Potential of compliance: good    Pharmacist comments:  [name] is a pleasant 59 yo F presenting with a current medication list. She reports excellent compliance with all of her medications and seems to understand the importance of continued use. She stated that she took 2 Nitro tabs last Friday for chest pain/high BP and was seen in the ED. Since then she has not had any further episodes like this. She does state that she measures her BP and pulse at home which at times vary from low values to high values. She did not have any specific medication-related questions or concerns for me at this time.   [name]. Velva Harman, PharmD, BCPS, CPP Clinical Pharmacist Pager: [phone] Phone: [phone] 10/19/2014 10:05 AM
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
## 2014-10-19 NOTE — Progress Notes (Signed)
Medication Samples have been provided to the patient.  Drug name: Entresto 24/26 mg  Qty: 4 bottles  LOT: [id number]  Exp.Date: Feb 2017  The patient has been instructed regarding the correct time, dose, and frequency of taking this medication, including desired effects and most common side effects.   [name] 10:49 AM 10/19/2014

## 2014-10-19 NOTE — Patient Instructions (Signed)
Stop Lisinopril  Start Entresto 24/26 mg Twice daily STARTING ON Friday 9/30 AM  Labs in 2 weeks  Your physician recommends that you schedule a follow-up appointment in: 1 month

## 2014-10-24 ENCOUNTER — Other Ambulatory Visit: Payer: Self-pay | Admitting: Cardiology

## 2014-10-24 MED ORDER — ATORVASTATIN CALCIUM 80 MG PO TABS: 80.0000 mg | ORAL_TABLET | Freq: Every day | ORAL | Status: DC

## 2014-11-02 ENCOUNTER — Ambulatory Visit (HOSPITAL_COMMUNITY)
Admission: RE | Admit: 2014-11-02 | Discharge: 2014-11-02 | Disposition: A | Discharge status: Home / Self Care | Payer: Medicaid Other | Source: Ambulatory Visit | Attending: Cardiology | Admitting: Cardiology

## 2014-11-02 DIAGNOSIS — I5022 Chronic systolic (congestive) heart failure: Secondary | ICD-10-CM | POA: Diagnosis not present

## 2014-11-02 DIAGNOSIS — I5020 Unspecified systolic (congestive) heart failure: Secondary | ICD-10-CM | POA: Diagnosis present

## 2014-11-02 LAB — BASIC METABOLIC PANEL
ANION GAP: 7 (ref 5–15)
BUN: 18 mg/dL (ref 6–20)
CALCIUM: 9.7 mg/dL (ref 8.9–10.3)
CHLORIDE: 103 mmol/L (ref 101–111)
CO2: 28 mmol/L (ref 22–32)
CREATININE: 1.27 mg/dL — AB (ref 0.44–1.00)
GFR calc non Af Amer: 45 mL/min — ABNORMAL LOW (ref >60)
GFR, EST AFRICAN AMERICAN: 52 mL/min — AB (ref >60)
Glucose, Bld: 93 mg/dL (ref 65–99)
Potassium: 4.5 mmol/L (ref 3.5–5.1)
SODIUM: 138 mmol/L (ref 135–145)

## 2014-11-18 ENCOUNTER — Ambulatory Visit (HOSPITAL_COMMUNITY)
Admission: RE | Admit: 2014-11-18 | Discharge: 2014-11-18 | Disposition: A | Discharge status: Home / Self Care | Payer: Medicaid Other | Source: Ambulatory Visit | Attending: Cardiology | Admitting: Cardiology

## 2014-11-18 ENCOUNTER — Encounter (HOSPITAL_COMMUNITY): Payer: Self-pay

## 2014-11-18 VITALS — BP 108/58 | HR 66 | Wt 125.2 lb

## 2014-11-18 DIAGNOSIS — Z8249 Family history of ischemic heart disease and other diseases of the circulatory system: Secondary | ICD-10-CM | POA: Insufficient documentation

## 2014-11-18 DIAGNOSIS — Z95810 Presence of automatic (implantable) cardiac defibrillator: Secondary | ICD-10-CM | POA: Diagnosis not present

## 2014-11-18 DIAGNOSIS — I5022 Chronic systolic (congestive) heart failure: Secondary | ICD-10-CM

## 2014-11-18 DIAGNOSIS — F17210 Nicotine dependence, cigarettes, uncomplicated: Secondary | ICD-10-CM | POA: Diagnosis not present

## 2014-11-18 DIAGNOSIS — I5023 Acute on chronic systolic (congestive) heart failure: Secondary | ICD-10-CM

## 2014-11-18 DIAGNOSIS — K219 Gastro-esophageal reflux disease without esophagitis: Secondary | ICD-10-CM | POA: Diagnosis not present

## 2014-11-18 DIAGNOSIS — Z79899 Other long term (current) drug therapy: Secondary | ICD-10-CM | POA: Insufficient documentation

## 2014-11-18 DIAGNOSIS — E785 Hyperlipidemia, unspecified: Secondary | ICD-10-CM | POA: Insufficient documentation

## 2014-11-18 DIAGNOSIS — Z8673 Personal history of transient ischemic attack (TIA), and cerebral infarction without residual deficits: Secondary | ICD-10-CM | POA: Insufficient documentation

## 2014-11-18 DIAGNOSIS — I110 Hypertensive heart disease with heart failure: Secondary | ICD-10-CM | POA: Diagnosis not present

## 2014-11-18 DIAGNOSIS — Z7902 Long term (current) use of antithrombotics/antiplatelets: Secondary | ICD-10-CM | POA: Insufficient documentation

## 2014-11-18 DIAGNOSIS — I481 Persistent atrial fibrillation: Secondary | ICD-10-CM | POA: Diagnosis not present

## 2014-11-18 DIAGNOSIS — F419 Anxiety disorder, unspecified: Secondary | ICD-10-CM | POA: Diagnosis not present

## 2014-11-18 DIAGNOSIS — L409 Psoriasis, unspecified: Secondary | ICD-10-CM | POA: Diagnosis not present

## 2014-11-18 DIAGNOSIS — I480 Paroxysmal atrial fibrillation: Secondary | ICD-10-CM

## 2014-11-18 DIAGNOSIS — F1010 Alcohol abuse, uncomplicated: Secondary | ICD-10-CM | POA: Diagnosis not present

## 2014-11-18 DIAGNOSIS — D509 Iron deficiency anemia, unspecified: Secondary | ICD-10-CM | POA: Diagnosis not present

## 2014-11-18 DIAGNOSIS — E039 Hypothyroidism, unspecified: Secondary | ICD-10-CM | POA: Diagnosis not present

## 2014-11-18 DIAGNOSIS — F329 Major depressive disorder, single episode, unspecified: Secondary | ICD-10-CM | POA: Diagnosis not present

## 2014-11-18 DIAGNOSIS — I255 Ischemic cardiomyopathy: Secondary | ICD-10-CM | POA: Insufficient documentation

## 2014-11-18 DIAGNOSIS — I2510 Atherosclerotic heart disease of native coronary artery without angina pectoris: Secondary | ICD-10-CM | POA: Diagnosis not present

## 2014-11-18 LAB — LIPID PANEL
CHOL/HDL RATIO: 4.7 ratio
Cholesterol: 169 mg/dL (ref 0–200)
HDL: 36 mg/dL — AB (ref >40)
LDL CALC: 111 mg/dL — AB (ref 0–99)
Triglycerides: 112 mg/dL (ref <150)
VLDL: 22 mg/dL (ref 0–40)

## 2014-11-18 LAB — BASIC METABOLIC PANEL
Anion gap: 9 (ref 5–15)
BUN: 26 mg/dL — ABNORMAL HIGH (ref 6–20)
CHLORIDE: 101 mmol/L (ref 101–111)
CO2: 25 mmol/L (ref 22–32)
CREATININE: 1.34 mg/dL — AB (ref 0.44–1.00)
Calcium: 9.1 mg/dL (ref 8.9–10.3)
GFR calc Af Amer: 49 mL/min — ABNORMAL LOW (ref >60)
GFR calc non Af Amer: 42 mL/min — ABNORMAL LOW (ref >60)
Glucose, Bld: 87 mg/dL (ref 65–99)
Potassium: 4 mmol/L (ref 3.5–5.1)
SODIUM: 135 mmol/L (ref 135–145)

## 2014-11-18 MED ORDER — CARVEDILOL 6.25 MG PO TABS
9.3750 mg | ORAL_TABLET | Freq: Two times a day (BID) | ORAL | Status: DC
Start: 2014-11-18 — End: 2015-01-18

## 2014-11-18 NOTE — Progress Notes (Signed)
Medication Samples have been provided to the patient.  Drug name: Alyson Reedy: 28  LOT: [id number]  Exp.Date: 03/2015  The patient has been instructed regarding the correct time, dose, and frequency of taking this medication, including desired effects and most common side effects.   Philemon Kingdom D 10:47 AM 11/18/2014

## 2014-11-18 NOTE — Patient Instructions (Signed)
Labs today will call if abnormal   Increase Coreg 9.375 mg twice a day  Follow up in 2 months

## 2014-11-20 NOTE — Progress Notes (Signed)
Patient ID: [name], female   DOB: [birth year], 59 y.o.   MRN: [id number] PCP: N/A Cardiologist: Dr. Aundra Dubin  HPI: [name] is a 59 y/o female with a history of ETOH abuse, ICM, HTN, stroke and chronic systolic heart failure.  Admitted to [facility] 05/06/13 for pancreatitis. She had severely reduced systolic function with an EF of 20-25%. 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 Discharge weight was 140 pounds.   Admitted to [facility] 05/2013 with A fib RVR. Started on amiodarone 400 mg twice a day and Xarelto 20 mg daily. Chemically converted to NSR.  Admitted to [facility]  08/2013 with chest pain. Troponin was elevated. LHC was unchanged from April 2015 so she had CMRI that showed LAD territory was not viable, EF 28%. Plan to continue to treat medically.   Admitted 9/13 through 10/13/13 after attempted suicide. Overdosed on all HF medications. Discharged off carvedilol, lasix, lisinopril, xarelto and digoxin. Discharge weight 120 pounds. Discharged to [facility] which is a group home.  She has been doing much better since that time and is on sertraline.  Had colonoscopy on 03/29/14 with 8 polyps and diverticulosis. She developed increased dyspnea and had LHC/RHC in 3/16 showing mean RA 2, PA 45/21 (mean 34), PCWP mean 21, CI 2.77; LAD totally occluded with some collaterals from RCA, 40-50% OM1, 90% complex ostial PLV. No significant change on coronary angiography.  I thought that her dyspnea may have been due to worsened anemia.    In 8/16, she had St Jude ICD implanted.   She is doing well today.  Weight stable.  No dyspnea on flat ground but does get short of breath walking up steps.  No orthopnea/PND.  No further chest pain.  Still smoking 3-4 cigarettes/day.   ECHO 09/02/13 EF 25-30%  ECHO 03/2014: EF 25-30%   Labs 05/15/13 K 3.7 Creatinine 0.97 Labs 06/02/13 K 3.9 Creatinine 1.0 Dig level 1.3 --> cut back dig to  0.0625 mg Labs 06/08/13 K 4.7 Creatinine 1.1 Dig level 0.4  Labs 06/19/13: K 4.1, creatinine 1.35 Labs 08/05/13: K 4.5, creatinine 1.17, AST 25, ALT 28, cholesterol 188, TG 185, HDL 41, LDL 110 Labs 08/17/13: K 4.9 Creatinine 2.0  Labs 09/06/13: Dig level 0.9 K 4.5 Creatinine 1.13 Labs 10/13/13 K 5.1 Creatinine 2.1 Labs 10/20/13 K 4.1 Creatinine 1.32 Labs 01/27/2014: Hgb 9.9, LDL 108  Labs 3/16: K 4.4, creatinine 1.08, hgb 8.6 Labs 9/16: K 3.6, creatinine 1.31, HCT 34.8, TnI 0.07 max.  Labs 10/16: K 4.5, creatinine 1.27   SH: Lives in a group home. Unemployed. Stopped smoking and drinking alcohol April 2015.   FH: Father MI at the age of 57  ROS: All systems negative except as listed in HPI, PMH and Problem List.  Past Medical History  Diagnosis Date  . Diverticulosis   . Pancreatitis 1980, 2015    in the setting of ETOH  . chronic systolic heart failure     a. ECHO (05/08/13): EF 20-25%, diff HK with akinesis of basal inferior wall, grade 1 DD, trivial MR, trivial TR b) RHC (05/06/13): RA 7, RV 30/2, PA 31/19 (24), PCWP 10, Fick CO/CI: 2.9/1.74, Thermo CO/Ci: 2.4/1.4, PA sat 53%  . Ischemic cardiomyopathy   . CAD (coronary artery disease)     a. LHC (04/2013): Chronically occluded LAD, moderate dz in large OM1 and severe dz and small posterior lateral vessel    .  Persistent atrial fibrillation ([place]) 2015  . Suicidal overdose ([place]) 10/03/13    Overdoes on Beta Blockers & Xarelto [facility] ED)  . Anxiety 2015   . Psoriasis 1968  . GERD (gastroesophageal reflux disease) 2015   . Hyperlipidemia 2015  . Hypertension 2015  . ETOH abuse 1981    started abusing alcohol at age 72, quit   . Hypothyroidism   . PONV (postoperative nausea and vomiting)   . Mass of right breast on mammogram 07/14/2014  . AICD (automatic cardioverter/defibrillator) present   . Myocardial infarction ([place])     "I've had many" (09/13/2014)  . Anginal pain ([place])   . Anemia   . History of blood transfusion     "related to  losing blood from somewhere; never found out from where"  . Stroke [facility]) 2011    left side weakness, minimal  . Depression   . 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/2014    Current Outpatient Prescriptions  Medication Sig Dispense Refill  . acetaminophen (TYLENOL) 325 MG tablet Take 650 mg by mouth every 6 (six) hours as needed for mild pain.    Marland Kitchen ammonium lactate (AMLACTIN) 12 % cream Apply topically as needed for dry skin (apply to elbows).    Marland Kitchen apixaban (ELIQUIS) 5 MG TABS tablet Take 1 tablet (5 mg total) by mouth 2 (two) times daily. 60 tablet 3  . atorvastatin (LIPITOR) 80 MG tablet Take 1 tablet (80 mg total) by mouth daily. 30 tablet 1  . furosemide (LASIX) 20 MG tablet Take 1 tablet (20 mg total) by mouth every other day. 15 tablet 6  . levothyroxine (SYNTHROID, LEVOTHROID) 25 MCG tablet Take 0.125 mcg by mouth daily before breakfast.    . LORazepam (ATIVAN) 0.5 MG tablet Take 1 tablet (0.5 mg total) by mouth 2 (two) times daily. 60 tablet 2  . ondansetron (ZOFRAN) 4 MG tablet Take 4 mg by mouth every 8 (eight) hours as needed for nausea or vomiting.    . pantoprazole (PROTONIX) 40 MG tablet TAKE 1 TABLET BY MOUTH TWICE DAILY BEFORE A MEAL. 60 tablet 5  . sacubitril-valsartan (ENTRESTO) 24-26 MG Take 1 tablet by mouth 2 (two) times daily. 56 tablet 0  . sertraline (ZOLOFT) 50 MG tablet Take 1 tablet (50 mg total) by mouth daily. 90 tablet 1  . spironolactone (ALDACTONE) 25 MG tablet Take 0.5 tablets (12.5 mg total) by mouth daily. 15 tablet 3  . carvedilol (COREG) 6.25 MG tablet Take 1.5 tablets (9.375 mg total) by mouth 2 (two) times daily. 180 tablet 3  . nitroGLYCERIN (NITROSTAT) 0.3 MG SL tablet Place 0.3 mg under the tongue every 5 (five) minutes as needed for chest pain (MAX 3 TABLETS).      No current facility-administered medications for this encounter.    Filed Vitals:   11/18/14 1010  BP: 108/58  Pulse: 66  Weight: 125 lb 4 oz  (56.813 kg)  SpO2: 97%    PHYSICAL EXAM: General: NAD HEENT: normal Neck: supple. JVP 5-6.  Carotids 2+ bilaterally; no bruits. No lymphadenopathy or thryomegaly appreciated. Cor: PMI normal. Regular rate & rhythm. No rubs, gallops or murmurs. Lungs: clear Abdomen: soft, nontender, nondistended. No hepatosplenomegaly. No bruits or masses. Good bowel sounds. Extremities: no cyanosis, clubbing, rash, edema Neuro: alert & orientedx3, cranial nerves grossly intact. Moves all 4 extremities w/o difficulty. Affect pleasant.  ASSESSMENT & PLAN: 1. Chronic Systolic Heart Failure: Primarily ischemic cardiomyopathy though ETOH may play a role. EF  25-30% (08/2013).  Cardiac MRI in August 2015 showed no viability in the LAD territory with EF 28%.  ECHO 04/09/2014 showed that EF remained 25-30%. NYHA class II symptoms.  Now s/p St Jude ICD.  Euvolemic on exam.  - Continue Delene Loll, will work on appealing Medicaid for coverage. - Increase Coreg to 9.375 mg bid.  - Continue current Lasix and spironolactone.  BMET today.  - Narrow QRS, so not a CRT candidate.   2. CAD: LHC with chronically occluded LAD and severe disease in small PLV.  No change on 3/16 cath compared to prior. cMRI showed no viability in LAD territory.  The chest pain episode with mild troponin elevation (0.07 maximum) that she had this month was likely due to demand ischemia in setting of marked hypertension.   - No aspirin as she is on apixaban.  Continue statin.  3. Current smoker: Encouraged again to stop smoking.  She is working on it.   4. Former Alcohol Abuse: Quit April 2015.  5. AF: Paroxysmal. maintaining NSR. Off amiodarone since July. She is doing ok on apixaban.    6. Suicide Attempt:  9/15 suicide attempt via overdose.  She is now on sertraline and doing much better.  7. Hyperlipidemia: Goal LDL < 70. Check lipids today.  8. Fe deficiency anemia: Gastritis/duodenitis.  Most recent hemoglobin was stable.   [name] 11/20/2014

## 2014-11-22 ENCOUNTER — Other Ambulatory Visit (HOSPITAL_COMMUNITY): Payer: Self-pay | Admitting: *Deleted

## 2014-11-22 ENCOUNTER — Encounter: Payer: Self-pay | Admitting: Internal Medicine

## 2014-11-22 MED ORDER — ROSUVASTATIN CALCIUM 40 MG PO TABS: 40.0000 mg | ORAL_TABLET | Freq: Every day | ORAL | Status: DC

## 2014-11-22 NOTE — Telephone Encounter (Signed)
Crestor called into [facility] in [place] per DM  Lipids and LFT's to be drawn in 2 months  Will be drawn at home by caretaker.  Order faxed to [phone]

## 2014-11-24 ENCOUNTER — Other Ambulatory Visit (HOSPITAL_COMMUNITY): Payer: Self-pay | Admitting: Hematology & Oncology

## 2014-11-24 ENCOUNTER — Encounter (HOSPITAL_COMMUNITY): Payer: Medicaid Other | Attending: Hematology & Oncology

## 2014-11-24 ENCOUNTER — Other Ambulatory Visit (HOSPITAL_COMMUNITY): Payer: Self-pay | Admitting: Oncology

## 2014-11-24 DIAGNOSIS — D508 Other iron deficiency anemias: Secondary | ICD-10-CM

## 2014-11-24 DIAGNOSIS — E538 Deficiency of other specified B group vitamins: Secondary | ICD-10-CM | POA: Diagnosis not present

## 2014-11-24 LAB — CBC WITH DIFFERENTIAL/PLATELET
BASOS ABS: 0 10*3/uL (ref 0.0–0.1)
BASOS PCT: 1 %
Eosinophils Absolute: 0.2 10*3/uL (ref 0.0–0.7)
Eosinophils Relative: 2 %
HEMATOCRIT: 38 % (ref 36.0–46.0)
HEMOGLOBIN: 12.4 g/dL (ref 12.0–15.0)
Lymphocytes Relative: 28 %
Lymphs Abs: 2.1 10*3/uL (ref 0.7–4.0)
MCH: 30.1 pg (ref 26.0–34.0)
MCHC: 32.6 g/dL (ref 30.0–36.0)
MCV: 92.2 fL (ref 78.0–100.0)
Monocytes Absolute: 0.7 10*3/uL (ref 0.1–1.0)
Monocytes Relative: 9 %
NEUTROS ABS: 4.5 10*3/uL (ref 1.7–7.7)
NEUTROS PCT: 60 %
Platelets: 191 10*3/uL (ref 150–400)
RBC: 4.12 MIL/uL (ref 3.87–5.11)
RDW: 11.9 % (ref 11.5–15.5)
WBC: 7.4 10*3/uL (ref 4.0–10.5)

## 2014-11-24 LAB — FERRITIN: Ferritin: 55 ng/mL (ref 11–307)

## 2014-11-25 NOTE — Progress Notes (Signed)
LABS DRAWN

## 2014-12-01 ENCOUNTER — Encounter (HOSPITAL_COMMUNITY): Payer: Self-pay

## 2014-12-01 ENCOUNTER — Encounter (HOSPITAL_BASED_OUTPATIENT_CLINIC_OR_DEPARTMENT_OTHER): Payer: Medicaid Other

## 2014-12-01 DIAGNOSIS — N6310 Unspecified lump in the right breast, unspecified quadrant: Secondary | ICD-10-CM

## 2014-12-01 DIAGNOSIS — D509 Iron deficiency anemia, unspecified: Secondary | ICD-10-CM

## 2014-12-01 MED ORDER — SODIUM CHLORIDE 0.9 % IV SOLN
125.0000 mg | Freq: Once | INTRAVENOUS | Status: AC
  Administered 2014-12-01: 125 mg via INTRAVENOUS
  Filled 2014-12-01: qty 10

## 2014-12-01 MED ORDER — SODIUM CHLORIDE 0.9 % IV SOLN
INTRAVENOUS | Status: DC
  Administered 2014-12-01: 14:00:00 via INTRAVENOUS

## 2014-12-01 NOTE — Patient Instructions (Signed)
[place] at [facility] Discharge Instructions  RECOMMENDATIONS MADE BY THE CONSULTANT AND ANY TEST RESULTS WILL BE SENT TO YOUR REFERRING PHYSICIAN.  You received ir iron today Will see you at your next appointment  Thank you for choosing [place] at [facility] to provide your oncology and hematology care.  To afford each patient quality time with our provider, please arrive at least 15 minutes before your scheduled appointment time.    You need to re-schedule your appointment should you arrive 10 or more minutes late.  We strive to give you quality time with our providers, and arriving late affects you and other patients whose appointments are after yours.  Also, if you no show three or more times for appointments you may be dismissed from the clinic at the providers discretion.     Again, thank you for choosing [facility].  Our hope is that these requests will decrease the amount of time that you wait before being seen by our physicians.       _____________________________________________________________  Should you have questions after your visit to [facility], please contact our office at [phone] between the hours of 8:30 a.m. and 4:30 p.m.  Voicemails left after 4:30 p.m. will not be returned until the following business day.  For prescription refill requests, have your pharmacy contact our office.

## 2014-12-01 NOTE — Progress Notes (Signed)
Patient tolerated iv iron well. 

## 2014-12-16 ENCOUNTER — Encounter: Payer: Self-pay | Admitting: Internal Medicine

## 2014-12-21 ENCOUNTER — Encounter: Payer: Self-pay | Admitting: Internal Medicine

## 2014-12-21 ENCOUNTER — Other Ambulatory Visit (HOSPITAL_COMMUNITY): Payer: Self-pay | Admitting: Pharmacist

## 2014-12-21 ENCOUNTER — Ambulatory Visit (INDEPENDENT_AMBULATORY_CARE_PROVIDER_SITE_OTHER): Payer: Medicaid Other | Admitting: Internal Medicine

## 2014-12-21 VITALS — BP 108/82 | HR 59 | Ht 64.0 in | Wt 125.6 lb

## 2014-12-21 DIAGNOSIS — I5022 Chronic systolic (congestive) heart failure: Secondary | ICD-10-CM | POA: Diagnosis not present

## 2014-12-21 DIAGNOSIS — I255 Ischemic cardiomyopathy: Secondary | ICD-10-CM | POA: Diagnosis not present

## 2014-12-21 LAB — CUP PACEART INCLINIC DEVICE CHECK
Date Time Interrogation Session: 20161130143435
HighPow Impedance: 78.75 Ohm
Implantable Lead Implant Date: 20160823
Lead Channel Pacing Threshold Amplitude: 0.5 V
Lead Channel Sensing Intrinsic Amplitude: 11.9 mV
MDC IDC LEAD LOCATION: 753860
MDC IDC MSMT BATTERY REMAINING LONGEVITY: 104.4
MDC IDC MSMT LEADCHNL RV IMPEDANCE VALUE: 687.5 Ohm
MDC IDC MSMT LEADCHNL RV PACING THRESHOLD PULSEWIDTH: 0.5 ms
MDC IDC PG SERIAL: 7254873
MDC IDC SET LEADCHNL RV PACING AMPLITUDE: 2.5 V
MDC IDC SET LEADCHNL RV PACING PULSEWIDTH: 0.5 ms
MDC IDC SET LEADCHNL RV SENSING SENSITIVITY: 0.5 mV
MDC IDC STAT BRADY RV PERCENT PACED: 0.02 %

## 2014-12-21 MED ORDER — SACUBITRIL-VALSARTAN 24-26 MG PO TABS: 1.0000 | ORAL_TABLET | Freq: Two times a day (BID) | ORAL | Status: DC

## 2014-12-21 NOTE — Patient Instructions (Signed)
Medication Instructions:  Your physician recommends that you continue on your current medications as directed. Please refer to the Current Medication list given to you today.   Labwork: None ordered   Testing/Procedures: None ordered   Follow-Up: Remote monitoring is used to monitor your ICD from home. This monitoring reduces the number of office visits required to check your device to one time per year. It allows [place] to keep an eye on the functioning of your device to ensure it is working properly. You are scheduled for a device check from home on 03/22/15. You may send your transmission at any time that day. If you have a wireless device, the transmission will be sent automatically. After your physician reviews your transmission, you will receive a postcard with your next transmission date.  Sharman Cheek, RN will call you about ICM clinic Your physician wants you to follow-up in: 12 months with [place] will receive a reminder letter in the mail two months in advance. If you don't receive a letter, please call our office to schedule the follow-up appointment.   Any Other Special Instructions Will Be Listed Below (If Applicable).     If you need a refill on your cardiac medications before your next appointment, please call your pharmacy.

## 2014-12-21 NOTE — Progress Notes (Signed)
Patient referred to [facility] clinic by Dr Rayann Heman.  ICM intro given during office visit and she agreed to monthly ICM calls. Patient letter sent with scheduled transmission date.

## 2014-12-21 NOTE — Progress Notes (Signed)
PCP: [name], MD Primary Cardiologist:  Dr Evalyn Casco [name] is a 59 y.o. female who presents today for routine electrophysiology followup.  Since her recent ICD Implantation, the patient reports doing very well.  Today, she denies symptoms of palpitations, chest pain, shortness of breath,  lower extremity edema, dizziness, presyncope, syncope, or ICD shocks.  The patient is otherwise without complaint today.   Past Medical History  Diagnosis Date  . Diverticulosis   . Pancreatitis 1980, 2015    in the setting of ETOH  . chronic systolic heart failure     a. ECHO (05/08/13): EF 20-25%, diff HK with akinesis of basal inferior wall, grade 1 DD, trivial MR, trivial TR b) RHC (05/06/13): RA 7, RV 30/2, PA 31/19 (24), PCWP 10, Fick CO/CI: 2.9/1.74, Thermo CO/Ci: 2.4/1.4, PA sat 53%  . Ischemic cardiomyopathy   . CAD (coronary artery disease)     a. LHC (04/2013): Chronically occluded LAD, moderate dz in large OM1 and severe dz and small posterior lateral vessel    . Persistent atrial fibrillation ([place]) 2015  . Suicidal overdose ([place]) 10/03/13    Overdoes on Beta Blockers & Xarelto [facility] ED)  . Anxiety 2015   . Psoriasis 1968  . GERD (gastroesophageal reflux disease) 2015   . Hyperlipidemia 2015  . Hypertension 2015  . ETOH abuse 1981    started abusing alcohol at age 33, quit   . Hypothyroidism   . PONV (postoperative nausea and vomiting)   . Mass of right breast on mammogram 07/14/2014  . AICD (automatic cardioverter/defibrillator) present   . Myocardial infarction ([place])     "I've had many" (09/13/2014)  . Anginal pain ([place])   . Anemia   . History of blood transfusion     "related to losing blood from somewhere; never found out from where"  . Stroke [facility]) 2011    left side weakness, minimal  . Depression   . 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/2014   Past Surgical History  Procedure Laterality Date  . Colostomy takedown  2007  .  Abdominal exploration surgery  2007  . Esophagogastroduodenoscopy (egd) with propofol N/A 11/22/2013    Dr. Oneida Alar: gastritis and duodenitis, negative H.pylori  . Esophageal biopsy N/A 11/22/2013    Procedure: GASTRIC BIOPSIES;  Surgeon: Danie Binder, MD;  Location: [facility] ORS;  Service: Endoscopy;  Laterality: N/A;  . Left and right heart catheterization with coronary angiogram N/A 05/10/2013    Procedure: LEFT AND RIGHT HEART CATHETERIZATION WITH CORONARY ANGIOGRAM;  Surgeon: [name], MD;  Location: [facility] CATH LAB;  Service: Cardiovascular;  Laterality: N/A;  . Left heart catheterization with coronary angiogram N/A 09/03/2013    Procedure: LEFT HEART CATHETERIZATION WITH CORONARY ANGIOGRAM;  Surgeon: Sinclair Grooms, MD;  Location: [facility] CATH LAB;  Service: Cardiovascular;  Laterality: N/A;  . Colonoscopy with propofol N/A 03/29/2014    SLF: 1. No definite source for anemia identified 2. 8 colon polyps removed 3. Severe diverticulosis noted the transverse colon  and right colon 4. Small internal hemorrohids.   . Polypectomy N/A 03/29/2014    Procedure: POLYPECTOMY;  Surgeon: Danie Binder, MD;  Location: [facility] ORS;  Service: Endoscopy;  Laterality: N/A;  ascending colon, sigmoid colon x4, rectal x3  . Left and right heart catheterization with coronary angiogram N/A 04/14/2014    Procedure: LEFT AND RIGHT HEART CATHETERIZATION WITH CORONARY ANGIOGRAM;  Surgeon: Larey Dresser, MD;  Location: [facility] CATH LAB;  Service: Cardiovascular;  Laterality: N/A;  . Agile capsule N/A 06/02/2014    Procedure: AGILE CAPSULE;  Surgeon: Danie Binder, MD;  Location: [facility] ENDO SUITE;  Service: Endoscopy;  Laterality: N/A;  0800  . Givens capsule study N/A 06/10/2014    Procedure: GIVENS CAPSULE STUDY;  Surgeon: Danie Binder, MD;  Location: [facility] ENDO SUITE;  Service: Endoscopy;  Laterality: N/A;  0700  . Ep implantable device N/A 09/13/2014    Procedure: ICD Implant;  Surgeon: [name], MD;  Location: [place] CV LAB;   Service: Cardiovascular;  Laterality: N/A;  . Colostomy reversal  ?2009  . Ileostomy  ?2009  . Ileostomy closure  ?2010  . Colon surgery    . Cardiac catheterization    . Ep implantable device  09/13/14    ICD St Jude, placed     ROS- all systems are reviewed and negative except as per HPI above  Current Outpatient Prescriptions  Medication Sig Dispense Refill  . acetaminophen (TYLENOL) 325 MG tablet Take 650 mg by mouth every 6 (six) hours as needed for mild pain.    Marland Kitchen ammonium lactate (AMLACTIN) 12 % cream Apply 1 g topically daily as needed for dry skin (apply to elbows).     Marland Kitchen apixaban (ELIQUIS) 5 MG TABS tablet Take 1 tablet (5 mg total) by mouth 2 (two) times daily. 60 tablet 3  . carvedilol (COREG) 6.25 MG tablet Take 1.5 tablets (9.375 mg total) by mouth 2 (two) times daily. 180 tablet 3  . furosemide (LASIX) 20 MG tablet Take 1 tablet (20 mg total) by mouth every other day. 15 tablet 6  . levothyroxine (SYNTHROID, LEVOTHROID) 25 MCG tablet Take 25 mcg by mouth daily before breakfast.     . LORazepam (ATIVAN) 0.5 MG tablet Take 1 tablet (0.5 mg total) by mouth 2 (two) times daily. 60 tablet 2  . nitroGLYCERIN (NITROSTAT) 0.3 MG SL tablet Place 0.3 mg under the tongue every 5 (five) minutes as needed for chest pain (MAX 3 TABLETS).     . ondansetron (ZOFRAN) 4 MG tablet Take 4 mg by mouth every 8 (eight) hours as needed for nausea or vomiting.    . pantoprazole (PROTONIX) 40 MG tablet TAKE 1 TABLET BY MOUTH TWICE DAILY BEFORE A MEAL. 60 tablet 5  . rosuvastatin (CRESTOR) 40 MG tablet Take 1 tablet (40 mg total) by mouth daily. D/C order for Atorvastatin 30 tablet 3  . sertraline (ZOLOFT) 50 MG tablet Take 100 mg by mouth daily.    Marland Kitchen spironolactone (ALDACTONE) 25 MG tablet Take 0.5 tablets (12.5 mg total) by mouth daily. 15 tablet 3  . sacubitril-valsartan (ENTRESTO) 24-26 MG Take 1 tablet by mouth 2 (two) times daily. 60 tablet 5   No current facility-administered medications for  this visit.    Physical Exam: Filed Vitals:   12/21/14 1207  BP: 108/82  Pulse: 59  Height: 5\' 4"  (1.626 m)  Weight: 125 lb 9.6 oz (56.972 kg)    GEN- The patient is well appearing, alert and oriented x 3 today.   Head- normocephalic, atraumatic Eyes-  Sclera clear, conjunctiva pink Ears- hearing intact Oropharynx- clear Lungs- Clear to ausculation bilaterally, normal work of breathing Chest- ICD pocket is well healed Heart- Regular rate and rhythm, no murmurs, rubs or gallops, PMI not laterally displaced GI- soft, NT, ND, + BS Extremities- no clubbing, cyanosis, or edema  ICD interrogation- reviewed in detail today,  See PACEART report  Assessment and Plan:  1.  Chronic systolic dysfunction euvolemic today Stable on an appropriate medical regimen Normal ICD function See Pace Art report No changes today Enroll in [facility] Device clinic [place]  Return to see EP NP In 1 year  [name] MD, [facility] 12/21/2014 11:16 PM

## 2014-12-21 NOTE — Telephone Encounter (Signed)
Entresto 24-26 mg PA approved through Medicaid until 12/08/2015. Pharmacy and patient notified.   [name]. Velva Harman, PharmD, BCPS, CPP Clinical Pharmacist Pager: [phone] Phone: [phone] 12/21/2014 12:55 PM

## 2015-01-01 NOTE — Progress Notes (Signed)
[name], MD [place] [address]  Iron deficiency anemia - Plan: CBC with Differential, Iron and TIBC, Ferritin, CBC with Differential, Ferritin, Iron and TIBC, CBC with Differential, Iron and TIBC, Ferritin  CURRENT THERAPY: IV iron replacement.  Oncology Flowsheet 12/01/2014  ferric gluconate (NULECIT) IV 125 mg    INTERVAL HISTORY: [name] 59 y.o. female returns for followup of iron deficiency anemia with Colonscopy on 03/29/2014 being negative and EGD on 11/22/2013 demonstrating duodenitis and gastritis. She also has a history of low B12.  I personally reviewed and went over laboratory results with the patient.  The results are noted within this dictation.  Labs will be updated today.  Labs are pending at this time.  She denies any complaints.  She denies any blood loss that she can identify.     Past Medical History  Diagnosis Date  . Diverticulosis   . Pancreatitis 1980, 2015    in the setting of ETOH  . chronic systolic heart failure     a. ECHO (05/08/13): EF 20-25%, diff HK with akinesis of basal inferior wall, grade 1 DD, trivial MR, trivial TR b) RHC (05/06/13): RA 7, RV 30/2, PA 31/19 (24), PCWP 10, Fick CO/CI: 2.9/1.74, Thermo CO/Ci: 2.4/1.4, PA sat 53%  . Ischemic cardiomyopathy   . CAD (coronary artery disease)     a. LHC (04/2013): Chronically occluded LAD, moderate dz in large OM1 and severe dz and small posterior lateral vessel    . Persistent atrial fibrillation ([place]) 2015  . Suicidal overdose ([place]) 10/03/13    Overdoes on Beta Blockers & Xarelto [facility])  . Anxiety 2015   . Psoriasis 1968  . GERD (gastroesophageal reflux disease) 2015   . Hyperlipidemia 2015  . Hypertension 2015  . ETOH abuse 1981    started abusing alcohol at age 57, quit   . Hypothyroidism   . PONV (postoperative nausea and vomiting)   . Mass of right breast on mammogram 07/14/2014  . AICD (automatic cardioverter/defibrillator) present   .  Myocardial infarction ([place])     "I've had many" (09/13/2014)  . Anginal pain ([place])   . Anemia   . History of blood transfusion     "related to losing blood from somewhere; never found out from where"  . Stroke [facility]) 2011    left side weakness, minimal  . Depression   . 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/2014    has Hypertension; GERD (gastroesophageal reflux disease); H/O alcohol abuse; Tobacco abuse; Family history of premature CAD; Chronic systolic CHF (congestive heart failure) ([place]); PAF (paroxysmal atrial fibrillation) ([place]); Cardiomyopathy, ischemic- EF 25-30% 2D 09/02/13; CAD- total LAD- residual OM2 disease; Anxiety state; Suicide attempt by beta blocker overdose ([place]); Psoriasis; CKD (chronic kidney disease) stage 3, GFR 30-59 ml/min; FH: colon cancer; Cervical cancer screening; Hypothyroidism; Gastritis and gastroduodenitis; Iron deficiency anemia; Acute on chronic systolic CHF (congestive heart failure) (H[facility]); CHF (congestive heart failure) ([place]); CHF exacerbation ([place]); Mass of right breast on mammogram; Ischemic cardiomyopathy; S/P ICD (internal cardiac defibrillator) procedure, St. Jude device, 09/13/14; and NSTEMI (non-ST elevated myocardial infarction) ([place]) on her problem list.     is allergic to morphine and related and penicillins.  Current Outpatient Prescriptions on File Prior to Visit  Medication Sig Dispense Refill  . acetaminophen (TYLENOL) 325 MG tablet Take 650 mg by mouth every 6 (six) hours as needed for mild pain.    Marland Kitchen  ammonium lactate (AMLACTIN) 12 % cream Apply 1 g topically daily as needed for dry skin (apply to elbows).     Marland Kitchen apixaban (ELIQUIS) 5 MG TABS tablet Take 1 tablet (5 mg total) by mouth 2 (two) times daily. 60 tablet 3  . carvedilol (COREG) 6.25 MG tablet Take 1.5 tablets (9.375 mg total) by mouth 2 (two) times daily. 180 tablet 3  . furosemide (LASIX) 20 MG tablet Take 1 tablet (20 mg total) by mouth every other day.  15 tablet 6  . levothyroxine (SYNTHROID, LEVOTHROID) 25 MCG tablet Take 25 mcg by mouth daily before breakfast.     . LORazepam (ATIVAN) 0.5 MG tablet Take 1 tablet (0.5 mg total) by mouth 2 (two) times daily. 60 tablet 2  . nitroGLYCERIN (NITROSTAT) 0.3 MG SL tablet Place 0.3 mg under the tongue every 5 (five) minutes as needed for chest pain (MAX 3 TABLETS).     . ondansetron (ZOFRAN) 4 MG tablet Take 4 mg by mouth every 8 (eight) hours as needed for nausea or vomiting.    . pantoprazole (PROTONIX) 40 MG tablet TAKE 1 TABLET BY MOUTH TWICE DAILY BEFORE A MEAL. 60 tablet 5  . rosuvastatin (CRESTOR) 40 MG tablet Take 1 tablet (40 mg total) by mouth daily. D/C order for Atorvastatin 30 tablet 3  . sacubitril-valsartan (ENTRESTO) 24-26 MG Take 1 tablet by mouth 2 (two) times daily. 60 tablet 5  . sertraline (ZOLOFT) 50 MG tablet Take 100 mg by mouth daily.    Marland Kitchen spironolactone (ALDACTONE) 25 MG tablet Take 0.5 tablets (12.5 mg total) by mouth daily. 15 tablet 3   No current facility-administered medications on file prior to visit.    Past Surgical History  Procedure Laterality Date  . Colostomy takedown  2007  . Abdominal exploration surgery  2007  . Esophagogastroduodenoscopy (egd) with propofol N/A 11/22/2013    Dr. Oneida Alar: gastritis and duodenitis, negative H.pylori  . Esophageal biopsy N/A 11/22/2013    Procedure: GASTRIC BIOPSIES;  Surgeon: Danie Binder, MD;  Location: [facility] ORS;  Service: Endoscopy;  Laterality: N/A;  . Left and right heart catheterization with coronary angiogram N/A 05/10/2013    Procedure: LEFT AND RIGHT HEART CATHETERIZATION WITH CORONARY ANGIOGRAM;  Surgeon: [name], MD;  Location: [facility] CATH LAB;  Service: Cardiovascular;  Laterality: N/A;  . Left heart catheterization with coronary angiogram N/A 09/03/2013    Procedure: LEFT HEART CATHETERIZATION WITH CORONARY ANGIOGRAM;  Surgeon: Sinclair Grooms, MD;  Location: [facility] CATH LAB;  Service: Cardiovascular;  Laterality:  N/A;  . Colonoscopy with propofol N/A 03/29/2014    SLF: 1. No definite source for anemia identified 2. 8 colon polyps removed 3. Severe diverticulosis noted the transverse colon  and right colon 4. Small internal hemorrohids.   . Polypectomy N/A 03/29/2014    Procedure: POLYPECTOMY;  Surgeon: Danie Binder, MD;  Location: [facility] ORS;  Service: Endoscopy;  Laterality: N/A;  ascending colon, sigmoid colon x4, rectal x3  . Left and right heart catheterization with coronary angiogram N/A 04/14/2014    Procedure: LEFT AND RIGHT HEART CATHETERIZATION WITH CORONARY ANGIOGRAM;  Surgeon: Larey Dresser, MD;  Location: [facility] CATH LAB;  Service: Cardiovascular;  Laterality: N/A;  . Agile capsule N/A 06/02/2014    Procedure: AGILE CAPSULE;  Surgeon: Danie Binder, MD;  Location: [facility] ENDO SUITE;  Service: Endoscopy;  Laterality: N/A;  0800  . Givens capsule study N/A 06/10/2014    Procedure: GIVENS CAPSULE STUDY;  Surgeon: Carlyon Prows  Rexene Edison, MD;  Location: [facility] END[facility] SU[facility];  Service: Endoscopy;  Laterality: N/A;  0700  . Ep implantable device N/A 09/13/2014    Procedure: ICD Implant;  Surgeon: [name], MD;  Location: [place] CV LAB;  Service: Cardiovascular;  Laterality: N/A;  . Colostomy reversal  ?2009  . Ileostomy  ?2009  . Ileostomy closure  ?2010  . Colon surgery    . Cardiac catheterization    . Ep implantable device  09/13/14    ICD St Jude, placed     Denies any headaches, dizziness, double vision, fevers, chills, night sweats, nausea, vomiting, diarrhea, constipation, chest pain, heart palpitations, shortness of breath, blood in stool, black tarry stool, urinary pain, urinary burning, urinary frequency, hematuria.   PHYSICAL EXAMINATION  ECOG PERFORMANCE STATUS: 1 - Symptomatic but completely ambulatory  Filed Vitals:   01/03/15 1414  BP: 108/54  Pulse: 61  Temp: 97.9 F (36.6 C)  Resp: 18    GENERAL:alert, no distress, well nourished, well developed, comfortable, cooperative, smiling and  unaccompanied today SKIN: skin color, texture, turgor are normal, no rashes or significant lesions HEAD: Normocephalic, No masses, lesions, tenderness or abnormalities EYES: normal, PERRLA, EOMI, Conjunctiva are pink and non-injected EARS: External ears normal OROPHARYNX:lips, buccal mucosa, and tongue normal and mucous membranes are moist  NECK: supple, no adenopathy, thyroid normal size, non-tender, without nodularity, trachea midline LYMPH:  no palpable lymphadenopathy BREAST:not examined LUNGS: clear to auscultation and percussion HEART: regular rate & rhythm, no murmurs and no gallops ABDOMEN:abdomen soft, non-tender and normal bowel sounds BACK: Back symmetric, no curvature. EXTREMITIES:less then 2 second capillary refill, no joint deformities, effusion, or inflammation, no skin discoloration, no cyanosis  NEURO: alert & oriented x 3 with fluent speech, no focal motor/sensory deficits, gait normal   LABORATORY DATA: CBC    Component Value Date/Time   WBC 5.5 01/03/2015 1430   RBC 4.36 01/03/2015 1430   RBC 3.00* 06/16/2014 1705   HGB 13.2 01/03/2015 1430   HCT 39.9 01/03/2015 1430   PLT 200 01/03/2015 1430   MCV 91.5 01/03/2015 1430   MCH 30.3 01/03/2015 1430   MCHC 33.1 01/03/2015 1430   RDW 12.5 01/03/2015 1430   LYMPHSABS 1.9 01/03/2015 1430   MONOABS 0.5 01/03/2015 1430   EOSABS 0.1 01/03/2015 1430   BASOSABS 0.1 01/03/2015 1430      Chemistry      Component Value Date/Time   NA 135 11/18/2014 1119   K 4.0 11/18/2014 1119   CL 101 11/18/2014 1119   CO2 25 11/18/2014 1119   BUN 26* 11/18/2014 1119   CREATININE 1.34* 11/18/2014 1119      Component Value Date/Time   CALCIUM 9.1 11/18/2014 1119   ALKPHOS 80 08/04/2014 1101   AST 37 08/04/2014 1101   ALT 38 08/04/2014 1101   BILITOT 0.5 08/04/2014 1101        PENDING LABS:   RADIOGRAPHIC STUDIES:  No results found.   PATHOLOGY:    ASSESSMENT AND PLAN:  Iron deficiency anemia Iron deficiency  anemia in the setting of chronic anticoagulation with Eliquis.  Negative GI work-up.  Oncology Flowsheet 12/01/2014  ferric gluconate (NULECIT) IV 125 mg   Labs today: CBC diff, iron/TIBC, ferritin  Labs in 6 weeks, 12 weeks, and 18 weeks: CBC diff, iron/TIBC, ferritin  Return in 18 weeks for follow-up.    THERAPY PLAN:  Continue with iron support as indicated.  All questions were answered. The patient knows to call the clinic with any  problems, questions or concerns. We can certainly see the patient much sooner if necessary.  Patient and plan discussed with Dr. Ancil Linsey and she is in agreement with the aforementioned.   This note is electronically signed by: Doy Mince 01/03/2015 5:10 PM

## 2015-01-01 NOTE — Assessment & Plan Note (Addendum)
Iron deficiency anemia in the setting of chronic anticoagulation with Eliquis.  Negative GI work-up.  Oncology Flowsheet 12/01/2014  ferric gluconate (NULECIT) IV 125 mg   Labs today: CBC diff, iron/TIBC, ferritin  Labs in 6 weeks, 12 weeks, and 18 weeks: CBC diff, iron/TIBC, ferritin  Return in 18 weeks for follow-up.

## 2015-01-03 ENCOUNTER — Encounter (HOSPITAL_COMMUNITY): Payer: Medicaid Other

## 2015-01-03 ENCOUNTER — Encounter (HOSPITAL_COMMUNITY): Payer: Self-pay | Admitting: Oncology

## 2015-01-03 ENCOUNTER — Encounter (HOSPITAL_COMMUNITY): Payer: Medicaid Other | Attending: Oncology | Admitting: Oncology

## 2015-01-03 VITALS — BP 108/54 | HR 61 | Temp 97.9°F | Resp 18 | Wt 122.0 lb

## 2015-01-03 DIAGNOSIS — D509 Iron deficiency anemia, unspecified: Secondary | ICD-10-CM | POA: Diagnosis present

## 2015-01-03 DIAGNOSIS — D508 Other iron deficiency anemias: Secondary | ICD-10-CM

## 2015-01-03 DIAGNOSIS — Z7901 Long term (current) use of anticoagulants: Secondary | ICD-10-CM | POA: Diagnosis not present

## 2015-01-03 DIAGNOSIS — D649 Anemia, unspecified: Secondary | ICD-10-CM | POA: Insufficient documentation

## 2015-01-03 LAB — FERRITIN: FERRITIN: 91 ng/mL (ref 11–307)

## 2015-01-03 LAB — CBC WITH DIFFERENTIAL/PLATELET
BASOS PCT: 1 %
Basophils Absolute: 0.1 10*3/uL (ref 0.0–0.1)
Eosinophils Absolute: 0.1 10*3/uL (ref 0.0–0.7)
Eosinophils Relative: 2 %
HEMATOCRIT: 39.9 % (ref 36.0–46.0)
HEMOGLOBIN: 13.2 g/dL (ref 12.0–15.0)
LYMPHS ABS: 1.9 10*3/uL (ref 0.7–4.0)
LYMPHS PCT: 34 %
MCH: 30.3 pg (ref 26.0–34.0)
MCHC: 33.1 g/dL (ref 30.0–36.0)
MCV: 91.5 fL (ref 78.0–100.0)
MONOS PCT: 9 %
Monocytes Absolute: 0.5 10*3/uL (ref 0.1–1.0)
NEUTROS ABS: 3 10*3/uL (ref 1.7–7.7)
NEUTROS PCT: 54 %
Platelets: 200 10*3/uL (ref 150–400)
RBC: 4.36 MIL/uL (ref 3.87–5.11)
RDW: 12.5 % (ref 11.5–15.5)
WBC: 5.5 10*3/uL (ref 4.0–10.5)

## 2015-01-03 LAB — IRON AND TIBC
Iron: 80 ug/dL (ref 28–170)
Saturation Ratios: 23 % (ref 10.4–31.8)
TIBC: 346 ug/dL (ref 250–450)
UIBC: 266 ug/dL

## 2015-01-03 NOTE — Patient Instructions (Signed)
..  [place] at [facility] Discharge Instructions  RECOMMENDATIONS MADE BY THE CONSULTANT AND ANY TEST RESULTS WILL BE SENT TO YOUR REFERRING PHYSICIAN.  Labs every 6-8 weeks Return in April or May of 2017 to  Be seen by Gershon Mussel     Thank you for choosing [place] at [facility] to provide your oncology and hematology care.  To afford each patient quality time with our provider, please arrive at least 15 minutes before your scheduled appointment time.    You need to re-schedule your appointment should you arrive 10 or more minutes late.  We strive to give you quality time with our providers, and arriving late affects you and other patients whose appointments are after yours.  Also, if you no show three or more times for appointments you may be dismissed from the clinic at the providers discretion.     Again, thank you for choosing [facility].  Our hope is that these requests will decrease the amount of time that you wait before being seen by our physicians.       _____________________________________________________________  Should you have questions after your visit to [facility], please contact our office at [phone] between the hours of 8:30 a.m. and 4:30 p.m.  Voicemails left after 4:30 p.m. will not be returned until the following business day.  For prescription refill requests, have your pharmacy contact our office.

## 2015-01-03 NOTE — Progress Notes (Signed)
..  [name]'s reason for visit today are for labs as scheduled per MD orders.  Venipuncture performed with a 23 gauge butterfly needle to R Antecubital.  [name] tolerated venipuncture well and without incident; questions were answered and patient was discharged.

## 2015-01-04 ENCOUNTER — Other Ambulatory Visit (HOSPITAL_COMMUNITY): Payer: Self-pay | Admitting: Oncology

## 2015-01-04 DIAGNOSIS — D509 Iron deficiency anemia, unspecified: Secondary | ICD-10-CM

## 2015-01-10 ENCOUNTER — Encounter (HOSPITAL_BASED_OUTPATIENT_CLINIC_OR_DEPARTMENT_OTHER): Payer: Medicaid Other

## 2015-01-10 VITALS — BP 119/70 | HR 53 | Temp 97.7°F | Resp 16

## 2015-01-10 DIAGNOSIS — D509 Iron deficiency anemia, unspecified: Secondary | ICD-10-CM | POA: Diagnosis present

## 2015-01-10 MED ORDER — SODIUM CHLORIDE 0.9 % IV SOLN
INTRAVENOUS | Status: DC
  Administered 2015-01-10: 14:00:00 via INTRAVENOUS

## 2015-01-10 MED ORDER — SODIUM CHLORIDE 0.9 % IV SOLN
125.0000 mg | Freq: Once | INTRAVENOUS | Status: AC
  Administered 2015-01-10: 125 mg via INTRAVENOUS
  Filled 2015-01-10: qty 10

## 2015-01-10 NOTE — Patient Instructions (Signed)
[place] at [facility] Discharge Instructions  RECOMMENDATIONS MADE BY THE CONSULTANT AND ANY TEST RESULTS WILL BE SENT TO YOUR REFERRING PHYSICIAN.  IV Iron today.  Sodium Ferric Gluconate Complex injection What is this medicine? SODIUM FERRIC GLUCONATE COMPLEX (SOE dee um FER ik GLOO koe nate KOM pleks) is an iron replacement.  What should I tell my health care provider before I take this medicine? They need to know if you have any of the following conditions: -anemia that is not from iron deficiency -high levels of iron in the body -an unusual or allergic reaction to iron, benzyl alcohol, other medicines, foods, dyes, or preservatives -pregnant or are trying to become pregnant -breast-feeding  How should I use this medicine? This medicine is for infusion into a vein. It is given by a health care professional in a hospital or clinic setting. Talk to your pediatrician regarding the use of this medicine in children. While this drug may be prescribed for children as young as 86 years old for selected conditions, precautions do apply. Overdosage: If you think you have taken too much of this medicine contact a poison control center or emergency room at once. NOTE: This medicine is only for you. Do not share this medicine with others.  What if I miss a dose? It is important not to miss your dose. Call your doctor or health care professional if you are unable to keep an appointment.  What may interact with this medicine? Do not take this medicine with any of the following medications: -deferoxamine -dimercaprol -other iron products This medicine may also interact with the following medications: -chloramphenicol -deferasirox -medicine for blood pressure like enalapril This list may not describe all possible interactions. Give your health care provider a list of all the medicines, herbs, non-prescription drugs, or dietary supplements you use. Also tell them if  you smoke, drink alcohol, or use illegal drugs. Some items may interact with your medicine.  What should I watch for while using this medicine? Your condition will be monitored carefully while you are receiving this medicine. Visit your doctor for check-ups as directed.  What side effects may I notice from receiving this medicine? Side effects that you should report to your doctor or health care professional as soon as possible: -allergic reactions like skin rash, itching or hives, swelling of the face, lips, or tongue -breathing problems -changes in hearing -changes in vision -chills, flushing, or sweating -fast, irregular heartbeat -feeling faint or lightheaded, falls -fever, flu-like symptoms -high or low blood pressure -pain, tingling, numbness in the hands or feet -severe pain in the chest, back, flanks, or groin -swelling of the ankles, feet, hands -trouble passing urine or change in the amount of urine -unusually weak or tired Side effects that usually do not require medical attention (report to your doctor or health care professional if they continue or are bothersome): -cramps -dark colored stools -diarrhea -headache -nausea, vomiting -stomach upset This list may not describe all possible side effects. Call your doctor for medical advice about side effects. You may report side effects to FDA at 1-800-FDA-1088.     2016, Elsevier/Gold Standard. (2007-09-09 15:58:57)   Thank you for choosing [place] at [facility] to provide your oncology and hematology care.  To afford each patient quality time with our provider, please arrive at least 15 minutes before your scheduled appointment time.    You need to re-schedule your appointment should you arrive 10 or more minutes  late.  We strive to give you quality time with our providers, and arriving late affects you and other patients whose appointments are after yours.  Also, if you no show three or more  times for appointments you may be dismissed from the clinic at the providers discretion.     Again, thank you for choosing [facility].  Our hope is that these requests will decrease the amount of time that you wait before being seen by our physicians.       _____________________________________________________________  Should you have questions after your visit to [facility], please contact our office at [phone] between the hours of 8:30 a.m. and 4:30 p.m.  Voicemails left after 4:30 p.m. will not be returned until the following business day.  For prescription refill requests, have your pharmacy contact our office.

## 2015-01-10 NOTE — Progress Notes (Signed)
Patient tolerated infusion well.  VSS.   

## 2015-01-18 ENCOUNTER — Encounter (HOSPITAL_COMMUNITY): Payer: Self-pay

## 2015-01-18 ENCOUNTER — Ambulatory Visit (HOSPITAL_COMMUNITY)
Admission: RE | Admit: 2015-01-18 | Discharge: 2015-01-18 | Disposition: A | Discharge status: Home / Self Care | Payer: Medicaid Other | Source: Ambulatory Visit | Attending: Cardiology | Admitting: Cardiology

## 2015-01-18 VITALS — BP 116/74 | HR 65 | Wt 125.0 lb

## 2015-01-18 DIAGNOSIS — I255 Ischemic cardiomyopathy: Secondary | ICD-10-CM | POA: Insufficient documentation

## 2015-01-18 DIAGNOSIS — Z95810 Presence of automatic (implantable) cardiac defibrillator: Secondary | ICD-10-CM | POA: Insufficient documentation

## 2015-01-18 DIAGNOSIS — R110 Nausea: Secondary | ICD-10-CM | POA: Insufficient documentation

## 2015-01-18 DIAGNOSIS — Z8673 Personal history of transient ischemic attack (TIA), and cerebral infarction without residual deficits: Secondary | ICD-10-CM | POA: Diagnosis not present

## 2015-01-18 DIAGNOSIS — R140 Abdominal distension (gaseous): Secondary | ICD-10-CM | POA: Diagnosis not present

## 2015-01-18 DIAGNOSIS — Z720 Tobacco use: Secondary | ICD-10-CM

## 2015-01-18 DIAGNOSIS — R630 Anorexia: Secondary | ICD-10-CM | POA: Insufficient documentation

## 2015-01-18 DIAGNOSIS — I480 Paroxysmal atrial fibrillation: Secondary | ICD-10-CM | POA: Diagnosis not present

## 2015-01-18 DIAGNOSIS — I10 Essential (primary) hypertension: Secondary | ICD-10-CM | POA: Insufficient documentation

## 2015-01-18 DIAGNOSIS — F17200 Nicotine dependence, unspecified, uncomplicated: Secondary | ICD-10-CM | POA: Insufficient documentation

## 2015-01-18 DIAGNOSIS — R1110 Vomiting, unspecified: Secondary | ICD-10-CM | POA: Diagnosis not present

## 2015-01-18 DIAGNOSIS — I5022 Chronic systolic (congestive) heart failure: Secondary | ICD-10-CM | POA: Diagnosis not present

## 2015-01-18 DIAGNOSIS — G4700 Insomnia, unspecified: Secondary | ICD-10-CM | POA: Insufficient documentation

## 2015-01-18 DIAGNOSIS — I5020 Unspecified systolic (congestive) heart failure: Secondary | ICD-10-CM | POA: Diagnosis not present

## 2015-01-18 DIAGNOSIS — D509 Iron deficiency anemia, unspecified: Secondary | ICD-10-CM | POA: Diagnosis not present

## 2015-01-18 DIAGNOSIS — Z7901 Long term (current) use of anticoagulants: Secondary | ICD-10-CM | POA: Insufficient documentation

## 2015-01-18 DIAGNOSIS — Z79899 Other long term (current) drug therapy: Secondary | ICD-10-CM | POA: Insufficient documentation

## 2015-01-18 DIAGNOSIS — K2970 Gastritis, unspecified, without bleeding: Secondary | ICD-10-CM | POA: Insufficient documentation

## 2015-01-18 DIAGNOSIS — I2510 Atherosclerotic heart disease of native coronary artery without angina pectoris: Secondary | ICD-10-CM | POA: Insufficient documentation

## 2015-01-18 DIAGNOSIS — E785 Hyperlipidemia, unspecified: Secondary | ICD-10-CM | POA: Diagnosis not present

## 2015-01-18 LAB — HEPATIC FUNCTION PANEL
ALBUMIN: 4 g/dL (ref 3.5–5.0)
ALK PHOS: 69 U/L (ref 38–126)
ALT: 23 U/L (ref 14–54)
AST: 24 U/L (ref 15–41)
Bilirubin, Direct: <0.1 mg/dL — ABNORMAL LOW (ref 0.1–0.5)
TOTAL PROTEIN: 7.2 g/dL (ref 6.5–8.1)
Total Bilirubin: 0.4 mg/dL (ref 0.3–1.2)

## 2015-01-18 LAB — BASIC METABOLIC PANEL
ANION GAP: 8 (ref 5–15)
BUN: 20 mg/dL (ref 6–20)
CHLORIDE: 107 mmol/L (ref 101–111)
CO2: 26 mmol/L (ref 22–32)
Calcium: 9.5 mg/dL (ref 8.9–10.3)
Creatinine, Ser: 1.26 mg/dL — ABNORMAL HIGH (ref 0.44–1.00)
GFR calc Af Amer: 53 mL/min — ABNORMAL LOW (ref >60)
GFR, EST NON AFRICAN AMERICAN: 46 mL/min — AB (ref >60)
GLUCOSE: 88 mg/dL (ref 65–99)
POTASSIUM: 4.3 mmol/L (ref 3.5–5.1)
Sodium: 141 mmol/L (ref 135–145)

## 2015-01-18 LAB — CBC
HEMATOCRIT: 35.5 % — AB (ref 36.0–46.0)
HEMOGLOBIN: 11.7 g/dL — AB (ref 12.0–15.0)
MCH: 30.2 pg (ref 26.0–34.0)
MCHC: 33 g/dL (ref 30.0–36.0)
MCV: 91.7 fL (ref 78.0–100.0)
Platelets: 174 10*3/uL (ref 150–400)
RBC: 3.87 MIL/uL (ref 3.87–5.11)
RDW: 12.5 % (ref 11.5–15.5)
WBC: 4.7 10*3/uL (ref 4.0–10.5)

## 2015-01-18 LAB — LIPID PANEL
Cholesterol: 160 mg/dL (ref 0–200)
HDL: 40 mg/dL — AB (ref >40)
LDL CALC: 98 mg/dL (ref 0–99)
Total CHOL/HDL Ratio: 4 RATIO
Triglycerides: 110 mg/dL (ref <150)
VLDL: 22 mg/dL (ref 0–40)

## 2015-01-18 MED ORDER — CARVEDILOL 12.5 MG PO TABS: 12.5000 mg | ORAL_TABLET | Freq: Two times a day (BID) | ORAL | Status: DC

## 2015-01-18 MED ORDER — ZOLPIDEM TARTRATE 10 MG PO TABS: 10.0000 mg | ORAL_TABLET | Freq: Every evening | ORAL | Status: DC | PRN

## 2015-01-18 NOTE — Progress Notes (Signed)
Advanced Heart Failure Medication Review by a Pharmacist  Does the patient  feel that his/her medications are working for him/her?  yes  Has the patient been experiencing any side effects to the medications prescribed?  no  Does the patient measure his/her own blood pressure or blood glucose at home?  no   Does the patient have any problems obtaining medications due to transportation or finances?   no  Understanding of regimen: good Understanding of indications: good Potential of compliance: good Patient understands to avoid NSAIDs. Patient understands to avoid decongestants.  Issues to address at subsequent visits: None   Pharmacist comments:  [name] is a pleasant 59 yo F presenting without a medication list but with good recall of her regimen including most dosages. She reports excellent compliance with her medications and did not have any specific medication-related questions or concerns for me at this time.   [name]. Velva Harman, PharmD, BCPS, CPP Clinical Pharmacist Pager: [phone] Phone: [phone] 01/18/2015 11:23 AM      Time with patient: 6 minutes Preparation and documentation time: 2 minutes Total time: 8 minutes

## 2015-01-18 NOTE — Patient Instructions (Signed)
INCREASE Coreg to 12.5 mg twice daily.  START Ambien 10 mg once daily at bedtime as needed for sleep.  Routine lab work today. Will notify you of abnormal results, otherwise no news is good news!  Will schedule Abdominal Ultrasound at [facility].  Follow up 3 months.  Do the following things EVERYDAY: 1) Weigh yourself in the morning before breakfast. Write it down and keep it in a log. 2) Take your medicines as prescribed 3) Eat low salt foods-Limit salt (sodium) to 2000 mg per day.  4) Stay as active as you can everyday 5) Limit all fluids for the day to less than 2 liters

## 2015-01-18 NOTE — Progress Notes (Signed)
Patient ID: [name], female   DOB: May 18, 1955, 59 y.o.   MRN: [id number] PCP: N/A Cardiologist: Dr. Aundra Dubin  HPI: [name] is a 59 y/o female with a history of ETOH abuse, ICM, HTN, stroke and chronic systolic heart failure.  Admitted to [facility] 05/06/13 for pancreatitis. She had severely reduced systolic function with an EF of 20-25%. 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 Discharge weight was 140 pounds.   Admitted to [facility] 05/2013 with A fib RVR. Started on amiodarone 400 mg twice a day and Xarelto 20 mg daily. Chemically converted to NSR.  Admitted to [facility]  08/2013 with chest pain. Troponin was elevated. LHC was unchanged from April 2015 so she had CMRI that showed LAD territory was not viable, EF 28%. Plan to continue to treat medically.   Admitted 9/13 through 10/13/13 after attempted suicide. Overdosed on all HF medications. Discharged off carvedilol, lasix, lisinopril, xarelto and digoxin. Discharge weight 120 pounds. Discharged to [facility] which is a group home.  She has been doing much better since that time and is on sertraline.  Had colonoscopy on 03/29/14 with 8 polyps and diverticulosis. She developed increased dyspnea and had LHC/RHC in 3/16 showing mean RA 2, PA 45/21 (mean 34), PCWP mean 21, CI 2.77; LAD totally occluded with some collaterals from RCA, 40-50% OM1, 90% complex ostial PLV. No significant change on coronary angiography.  I thought that her dyspnea may have been due to worsened anemia.    In 8/16, she had St Jude ICD implanted.   Weight stable.  No dyspnea on flat ground but does get short of breath walking up steps (chronic).  No orthopnea/PND.  No further chest pain.  Still smoking 3-4 cigarettes/day.  Main complaint today is a bloated feeling with poor appetite and chronic low-grade nausea.  She is also not sleeping well, goes days with minimal sleep. No abdominal pain or  diarrhea.  No fever, dysuria, or cough.   ECHO 09/02/13 EF 25-30%  ECHO 03/2014: EF 25-30%   Labs 05/15/13 K 3.7 Creatinine 0.97 Labs 06/02/13 K 3.9 Creatinine 1.0 Dig level 1.3 --> cut back dig to 0.0625 mg Labs 06/08/13 K 4.7 Creatinine 1.1 Dig level 0.4  Labs 06/19/13: K 4.1, creatinine 1.35 Labs 08/05/13: K 4.5, creatinine 1.17, AST 25, ALT 28, cholesterol 188, TG 185, HDL 41, LDL 110 Labs 08/17/13: K 4.9 Creatinine 2.0  Labs 09/06/13: Dig level 0.9 K 4.5 Creatinine 1.13 Labs 10/13/13 K 5.1 Creatinine 2.1 Labs 10/20/13 K 4.1 Creatinine 1.32 Labs 01/27/2014: Hgb 9.9, LDL 108  Labs 3/16: K 4.4, creatinine 1.08, hgb 8.6 Labs 9/16: K 3.6, creatinine 1.31, HCT 34.8, TnI 0.07 max.  Labs 10/16: K 4.5, creatinine 1.27 => 1.34, LDL 111, HDL 36 Labs (12/16): HCT 39.9   SH: Lives in a group home. Unemployed. Stopped smoking and drinking alcohol April 2015.   FH: Father MI at the age of 25  ROS: All systems negative except as listed in HPI, PMH and Problem List.  Past Medical History  Diagnosis Date  . Diverticulosis   . Pancreatitis 1980, 2015    in the setting of ETOH  . chronic systolic heart failure     a. ECHO (05/08/13): EF 20-25%, diff HK with akinesis of basal inferior wall, grade 1 DD, trivial MR, trivial TR b) RHC (05/06/13): RA 7, RV 30/2, PA 31/19 (24), PCWP 10, Fick CO/CI: 2.9/1.74, Thermo  CO/Ci: 2.4/1.4, PA sat 53%  . Ischemic cardiomyopathy   . CAD (coronary artery disease)     a. LHC (04/2013): Chronically occluded LAD, moderate dz in large OM1 and severe dz and small posterior lateral vessel    . Persistent atrial fibrillation ([place]) 2015  . Suicidal overdose ([place]) 10/03/13    Overdoes on Beta Blockers & Xarelto [facility] ED)  . Anxiety 2015   . Psoriasis 1968  . GERD (gastroesophageal reflux disease) 2015   . Hyperlipidemia 2015  . Hypertension 2015  . ETOH abuse 1981    started abusing alcohol at age 71, quit   . Hypothyroidism   . PONV (postoperative nausea and vomiting)   .  Mass of right breast on mammogram 07/14/2014  . AICD (automatic cardioverter/defibrillator) present   . Myocardial infarction ([place])     "I've had many" (09/13/2014)  . Anginal pain ([place])   . Anemia   . History of blood transfusion     "related to losing blood from somewhere; never found out from where"  . Stroke [facility]) 2011    left side weakness, minimal  . Depression   . 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/2014    Current Outpatient Prescriptions  Medication Sig Dispense Refill  . acetaminophen (TYLENOL) 325 MG tablet Take 650 mg by mouth every 6 (six) hours as needed for mild pain.    Marland Kitchen ammonium lactate (AMLACTIN) 12 % cream Apply 1 g topically daily as needed for dry skin (apply to elbows).     Marland Kitchen apixaban (ELIQUIS) 5 MG TABS tablet Take 1 tablet (5 mg total) by mouth 2 (two) times daily. 60 tablet 3  . carvedilol (COREG) 12.5 MG tablet Take 1 tablet (12.5 mg total) by mouth 2 (two) times daily. 60 tablet 6  . furosemide (LASIX) 20 MG tablet Take 1 tablet (20 mg total) by mouth every other day. 15 tablet 6  . levothyroxine (SYNTHROID, LEVOTHROID) 25 MCG tablet Take 25 mcg by mouth daily before breakfast.     . LORazepam (ATIVAN) 0.5 MG tablet Take 1 tablet (0.5 mg total) by mouth 2 (two) times daily. 60 tablet 2  . ondansetron (ZOFRAN) 4 MG tablet Take 4 mg by mouth every 8 (eight) hours as needed for nausea or vomiting.    . pantoprazole (PROTONIX) 40 MG tablet TAKE 1 TABLET BY MOUTH TWICE DAILY BEFORE A MEAL. 60 tablet 5  . rosuvastatin (CRESTOR) 40 MG tablet Take 1 tablet (40 mg total) by mouth daily. D/C order for Atorvastatin 30 tablet 3  . sacubitril-valsartan (ENTRESTO) 24-26 MG Take 1 tablet by mouth 2 (two) times daily. 60 tablet 5  . sertraline (ZOLOFT) 50 MG tablet Take 100 mg by mouth daily.    Marland Kitchen spironolactone (ALDACTONE) 25 MG tablet Take 0.5 tablets (12.5 mg total) by mouth daily. 15 tablet 3  . nitroGLYCERIN (NITROSTAT) 0.3 MG SL tablet  Place 0.3 mg under the tongue every 5 (five) minutes as needed for chest pain (MAX 3 TABLETS). Reported on 01/18/2015    . zolpidem (AMBIEN) 10 MG tablet Take 1 tablet (10 mg total) by mouth at bedtime as needed for sleep. 30 tablet 0   No current facility-administered medications for this encounter.    Filed Vitals:   01/18/15 1116  BP: 116/74  Pulse: 65  Weight: 125 lb (56.7 kg)  SpO2: 97%    PHYSICAL EXAM: General: NAD HEENT: normal Neck: supple. JVP 5-6.  Carotids 2+ bilaterally; no bruits. No  lymphadenopathy or thryomegaly appreciated. Cor: PMI normal. Regular rate & rhythm. No rubs, gallops or murmurs. Lungs: clear Abdomen: soft, nontender, nondistended. No hepatosplenomegaly. No bruits or masses. Good bowel sounds. Extremities: no cyanosis, clubbing, rash, edema Neuro: alert & orientedx3, cranial nerves grossly intact. Moves all 4 extremities w/o difficulty. Affect pleasant.  ASSESSMENT & PLAN: 1. Chronic Systolic Heart Failure: Primarily ischemic cardiomyopathy though ETOH may play a role. EF 25-30% (08/2013).  Cardiac MRI in August 2015 showed no viability in the LAD territory with EF 28%.  ECHO 04/09/2014 showed that EF remained 25-30%. NYHA class II symptoms.  Now s/p St Jude ICD.  Euvolemic on exam.  - Continue Entresto, Coreg, Lasix, and spironolactone at current doses. - BMET today.  - Narrow QRS, so not a CRT candidate.   2. CAD: LHC with chronically occluded LAD and severe disease in small PLV.  No change on 3/16 cath compared to prior. cMRI showed no viability in LAD territory.    - No aspirin as she is on apixaban.  Continue statin.  3. Current smoker: Encouraged again to stop smoking.  She is working on it.   4. Former Alcohol Abuse: Quit April 2015.  5. AF: Paroxysmal. maintaining NSR. Off amiodarone since July. She is doing ok on apixaban.    6. Suicide Attempt:  9/15 suicide attempt via overdose.  She is now on sertraline and doing much better.  7.  Hyperlipidemia: Goal LDL < 70. Check lipids today.  8. Fe deficiency anemia: Gastritis/duodenitis.  Most recent hemoglobin was stable.  9. Nausea/loss of appetite/bloating: Given stable weight and lack of volume overload on exam, I do not think that these symptoms are due to CHF.  I will check LFTs today and will arrange for abdominal US to evaluate gallbladder.  10. Insomnia: She can use a low dose of Ambien prn.   [name] 01/18/2015

## 2015-01-20 ENCOUNTER — Telehealth (HOSPITAL_COMMUNITY): Payer: Self-pay | Admitting: Cardiology

## 2015-01-20 MED ORDER — EZETIMIBE 10 MG PO TABS: 10.0000 mg | ORAL_TABLET | Freq: Every day | ORAL | Status: DC

## 2015-01-20 NOTE — Telephone Encounter (Signed)
-----   Message from Larey Dresser, MD sent at 01/19/2015 12:22 AM EST ----- LDL still above goal despite Crestor 40 daily.  Add Zetia 10 mg daily, lipids/LFTs in 2 months.

## 2015-01-20 NOTE — Telephone Encounter (Signed)
PT AWARE OF LAB RESULTS RX SENT TO PHARMACY, LAB APPT X 2 MONTHS

## 2015-01-24 ENCOUNTER — Telehealth: Payer: Self-pay

## 2015-01-24 NOTE — Telephone Encounter (Signed)
Prior auth obtained for Ezetimibe 10mg  through [employer]. Approval # [id number], good for one year.

## 2015-01-25 ENCOUNTER — Telehealth: Payer: Self-pay

## 2015-01-25 NOTE — Telephone Encounter (Signed)
ICM transmission received.  Attempted call to number [phone] and recording stated has been temporarily disconnected and no new number listed.

## 2015-01-25 NOTE — Telephone Encounter (Signed)
I SPOKE TO [name] HER HOMEHEATH NURSE AID ABOUT CORRECTED WOUND CHECK APPOINTMENT AND TIME ON 09/28/14 AT 9 AM WAS ORGINALLY SCHEDULED TO SEE AMBER S.NP. on 09/28/14 at 1:40 WHICH NEEDED TO BE A WOUND CHECK APPOINTMENT

## 2015-02-03 ENCOUNTER — Ambulatory Visit (HOSPITAL_COMMUNITY)
Admission: RE | Admit: 2015-02-03 | Discharge: 2015-02-03 | Disposition: A | Discharge status: Home / Self Care | Payer: Medicaid Other | Source: Ambulatory Visit | Attending: Cardiology | Admitting: Cardiology

## 2015-02-03 DIAGNOSIS — I10 Essential (primary) hypertension: Secondary | ICD-10-CM | POA: Diagnosis not present

## 2015-02-03 DIAGNOSIS — R197 Diarrhea, unspecified: Secondary | ICD-10-CM | POA: Diagnosis not present

## 2015-02-03 DIAGNOSIS — I700 Atherosclerosis of aorta: Secondary | ICD-10-CM | POA: Insufficient documentation

## 2015-02-03 DIAGNOSIS — R1110 Vomiting, unspecified: Secondary | ICD-10-CM | POA: Diagnosis not present

## 2015-02-03 DIAGNOSIS — K829 Disease of gallbladder, unspecified: Secondary | ICD-10-CM | POA: Diagnosis present

## 2015-02-07 NOTE — Telephone Encounter (Signed)
Unable to reach patient for ICM due to disconnected phone number.  Next remote ICM transmission scheduled for 02/23/2015.

## 2015-02-14 ENCOUNTER — Inpatient Hospital Stay (HOSPITAL_COMMUNITY)
Admission: EM | Admit: 2015-02-14 | Discharge: 2015-02-24 | DRG: 871 | Disposition: A | Discharge status: Nursing Home (Includes ICF) | Payer: Medicaid Other | Attending: Internal Medicine | Admitting: Internal Medicine

## 2015-02-14 ENCOUNTER — Inpatient Hospital Stay (HOSPITAL_COMMUNITY): Payer: Medicaid Other

## 2015-02-14 ENCOUNTER — Emergency Department (HOSPITAL_COMMUNITY): Payer: Medicaid Other

## 2015-02-14 ENCOUNTER — Encounter (HOSPITAL_COMMUNITY): Payer: Self-pay | Admitting: Emergency Medicine

## 2015-02-14 DIAGNOSIS — Z66 Do not resuscitate: Secondary | ICD-10-CM | POA: Diagnosis present

## 2015-02-14 DIAGNOSIS — I481 Persistent atrial fibrillation: Secondary | ICD-10-CM | POA: Diagnosis present

## 2015-02-14 DIAGNOSIS — I255 Ischemic cardiomyopathy: Secondary | ICD-10-CM | POA: Diagnosis present

## 2015-02-14 DIAGNOSIS — E872 Acidosis: Secondary | ICD-10-CM | POA: Diagnosis present

## 2015-02-14 DIAGNOSIS — Z8673 Personal history of transient ischemic attack (TIA), and cerebral infarction without residual deficits: Secondary | ICD-10-CM

## 2015-02-14 DIAGNOSIS — R6520 Severe sepsis without septic shock: Secondary | ICD-10-CM | POA: Diagnosis present

## 2015-02-14 DIAGNOSIS — E876 Hypokalemia: Secondary | ICD-10-CM | POA: Diagnosis present

## 2015-02-14 DIAGNOSIS — F17210 Nicotine dependence, cigarettes, uncomplicated: Secondary | ICD-10-CM | POA: Diagnosis present

## 2015-02-14 DIAGNOSIS — I480 Paroxysmal atrial fibrillation: Secondary | ICD-10-CM | POA: Diagnosis present

## 2015-02-14 DIAGNOSIS — Z811 Family history of alcohol abuse and dependence: Secondary | ICD-10-CM

## 2015-02-14 DIAGNOSIS — N179 Acute kidney failure, unspecified: Secondary | ICD-10-CM | POA: Diagnosis not present

## 2015-02-14 DIAGNOSIS — E860 Dehydration: Secondary | ICD-10-CM | POA: Diagnosis present

## 2015-02-14 DIAGNOSIS — I10 Essential (primary) hypertension: Secondary | ICD-10-CM | POA: Diagnosis present

## 2015-02-14 DIAGNOSIS — J9601 Acute respiratory failure with hypoxia: Secondary | ICD-10-CM | POA: Diagnosis not present

## 2015-02-14 DIAGNOSIS — D696 Thrombocytopenia, unspecified: Secondary | ICD-10-CM | POA: Diagnosis present

## 2015-02-14 DIAGNOSIS — E861 Hypovolemia: Secondary | ICD-10-CM

## 2015-02-14 DIAGNOSIS — K219 Gastro-esophageal reflux disease without esophagitis: Secondary | ICD-10-CM | POA: Diagnosis present

## 2015-02-14 DIAGNOSIS — R609 Edema, unspecified: Secondary | ICD-10-CM | POA: Diagnosis not present

## 2015-02-14 DIAGNOSIS — I5023 Acute on chronic systolic (congestive) heart failure: Secondary | ICD-10-CM | POA: Diagnosis present

## 2015-02-14 DIAGNOSIS — E875 Hyperkalemia: Secondary | ICD-10-CM | POA: Diagnosis not present

## 2015-02-14 DIAGNOSIS — I808 Phlebitis and thrombophlebitis of other sites: Secondary | ICD-10-CM | POA: Diagnosis not present

## 2015-02-14 DIAGNOSIS — T502X5A Adverse effect of carbonic-anhydrase inhibitors, benzothiadiazides and other diuretics, initial encounter: Secondary | ICD-10-CM | POA: Diagnosis present

## 2015-02-14 DIAGNOSIS — I252 Old myocardial infarction: Secondary | ICD-10-CM

## 2015-02-14 DIAGNOSIS — R600 Localized edema: Secondary | ICD-10-CM

## 2015-02-14 DIAGNOSIS — G9340 Encephalopathy, unspecified: Secondary | ICD-10-CM | POA: Diagnosis present

## 2015-02-14 DIAGNOSIS — Z95810 Presence of automatic (implantable) cardiac defibrillator: Secondary | ICD-10-CM | POA: Diagnosis not present

## 2015-02-14 DIAGNOSIS — Z515 Encounter for palliative care: Secondary | ICD-10-CM | POA: Diagnosis not present

## 2015-02-14 DIAGNOSIS — A419 Sepsis, unspecified organism: Secondary | ICD-10-CM | POA: Diagnosis not present

## 2015-02-14 DIAGNOSIS — R0781 Pleurodynia: Secondary | ICD-10-CM

## 2015-02-14 DIAGNOSIS — Z833 Family history of diabetes mellitus: Secondary | ICD-10-CM | POA: Diagnosis not present

## 2015-02-14 DIAGNOSIS — A4151 Sepsis due to Escherichia coli [E. coli]: Principal | ICD-10-CM | POA: Diagnosis present

## 2015-02-14 DIAGNOSIS — Z7189 Other specified counseling: Secondary | ICD-10-CM | POA: Diagnosis not present

## 2015-02-14 DIAGNOSIS — Z7901 Long term (current) use of anticoagulants: Secondary | ICD-10-CM | POA: Diagnosis not present

## 2015-02-14 DIAGNOSIS — N170 Acute kidney failure with tubular necrosis: Secondary | ICD-10-CM | POA: Diagnosis present

## 2015-02-14 DIAGNOSIS — R7981 Abnormal blood-gas level: Secondary | ICD-10-CM | POA: Insufficient documentation

## 2015-02-14 DIAGNOSIS — Z8249 Family history of ischemic heart disease and other diseases of the circulatory system: Secondary | ICD-10-CM

## 2015-02-14 DIAGNOSIS — I2510 Atherosclerotic heart disease of native coronary artery without angina pectoris: Secondary | ICD-10-CM | POA: Diagnosis present

## 2015-02-14 DIAGNOSIS — E785 Hyperlipidemia, unspecified: Secondary | ICD-10-CM | POA: Diagnosis present

## 2015-02-14 DIAGNOSIS — E039 Hypothyroidism, unspecified: Secondary | ICD-10-CM | POA: Diagnosis present

## 2015-02-14 DIAGNOSIS — N183 Chronic kidney disease, stage 3 (moderate): Secondary | ICD-10-CM | POA: Diagnosis present

## 2015-02-14 DIAGNOSIS — Z800 Family history of malignant neoplasm of digestive organs: Secondary | ICD-10-CM | POA: Diagnosis not present

## 2015-02-14 DIAGNOSIS — N390 Urinary tract infection, site not specified: Secondary | ICD-10-CM | POA: Diagnosis present

## 2015-02-14 DIAGNOSIS — I130 Hypertensive heart and chronic kidney disease with heart failure and stage 1 through stage 4 chronic kidney disease, or unspecified chronic kidney disease: Secondary | ICD-10-CM | POA: Diagnosis present

## 2015-02-14 DIAGNOSIS — B9620 Unspecified Escherichia coli [E. coli] as the cause of diseases classified elsewhere: Secondary | ICD-10-CM | POA: Diagnosis present

## 2015-02-14 DIAGNOSIS — I959 Hypotension, unspecified: Secondary | ICD-10-CM

## 2015-02-14 DIAGNOSIS — D649 Anemia, unspecified: Secondary | ICD-10-CM | POA: Diagnosis present

## 2015-02-14 DIAGNOSIS — T68XXXA Hypothermia, initial encounter: Secondary | ICD-10-CM

## 2015-02-14 DIAGNOSIS — T447X2A Poisoning by beta-adrenoreceptor antagonists, intentional self-harm, initial encounter: Secondary | ICD-10-CM | POA: Diagnosis present

## 2015-02-14 DIAGNOSIS — R4182 Altered mental status, unspecified: Secondary | ICD-10-CM | POA: Diagnosis not present

## 2015-02-14 DIAGNOSIS — I5022 Chronic systolic (congestive) heart failure: Secondary | ICD-10-CM | POA: Diagnosis not present

## 2015-02-14 LAB — URINALYSIS, ROUTINE W REFLEX MICROSCOPIC
Glucose, UA: NEGATIVE mg/dL
NITRITE: NEGATIVE
Protein, ur: 100 mg/dL — AB
pH: 5 (ref 5.0–8.0)

## 2015-02-14 LAB — RAPID URINE DRUG SCREEN, HOSP PERFORMED
AMPHETAMINES: NOT DETECTED
BENZODIAZEPINES: POSITIVE — AB
Barbiturates: NOT DETECTED
COCAINE: NOT DETECTED
OPIATES: POSITIVE — AB
TETRAHYDROCANNABINOL: NOT DETECTED

## 2015-02-14 LAB — COMPREHENSIVE METABOLIC PANEL
ALT: 14 U/L (ref 14–54)
ANION GAP: 18 — AB (ref 5–15)
AST: 22 U/L (ref 15–41)
Albumin: 3.5 g/dL (ref 3.5–5.0)
Alkaline Phosphatase: 88 U/L (ref 38–126)
BILIRUBIN TOTAL: 0.6 mg/dL (ref 0.3–1.2)
BUN: 65 mg/dL — ABNORMAL HIGH (ref 6–20)
CO2: 18 mmol/L — ABNORMAL LOW (ref 22–32)
Calcium: 7.7 mg/dL — ABNORMAL LOW (ref 8.9–10.3)
Chloride: 101 mmol/L (ref 101–111)
Creatinine, Ser: 7.97 mg/dL — ABNORMAL HIGH (ref 0.44–1.00)
GFR calc non Af Amer: 5 mL/min — ABNORMAL LOW (ref >60)
GFR, EST AFRICAN AMERICAN: 6 mL/min — AB (ref >60)
GLUCOSE: 108 mg/dL — AB (ref 65–99)
POTASSIUM: 5.5 mmol/L — AB (ref 3.5–5.1)
Sodium: 137 mmol/L (ref 135–145)
TOTAL PROTEIN: 7.2 g/dL (ref 6.5–8.1)

## 2015-02-14 LAB — CBC
HEMATOCRIT: 30.9 % — AB (ref 36.0–46.0)
HEMOGLOBIN: 10.4 g/dL — AB (ref 12.0–15.0)
MCH: 30.2 pg (ref 26.0–34.0)
MCHC: 33.7 g/dL (ref 30.0–36.0)
MCV: 89.8 fL (ref 78.0–100.0)
Platelets: 153 10*3/uL (ref 150–400)
RBC: 3.44 MIL/uL — AB (ref 3.87–5.11)
RDW: 13 % (ref 11.5–15.5)
WBC: 20.9 10*3/uL — AB (ref 4.0–10.5)

## 2015-02-14 LAB — URINE MICROSCOPIC-ADD ON

## 2015-02-14 LAB — CBG MONITORING, ED: Glucose-Capillary: 99 mg/dL (ref 65–99)

## 2015-02-14 LAB — MRSA PCR SCREENING: MRSA by PCR: NEGATIVE

## 2015-02-14 LAB — I-STAT CG4 LACTIC ACID, ED: Lactic Acid, Venous: 1.39 mmol/L (ref 0.5–2.0)

## 2015-02-14 LAB — CK: Total CK: 350 U/L — ABNORMAL HIGH (ref 38–234)

## 2015-02-14 LAB — TROPONIN I: Troponin I: 0.03 ng/mL (ref <0.031)

## 2015-02-14 MED ORDER — PHENYLEPHRINE HCL 10 MG/ML IJ SOLN
INTRAMUSCULAR | Status: AC
  Filled 2015-02-14: qty 4

## 2015-02-14 MED ORDER — ONDANSETRON HCL 4 MG PO TABS
4.0000 mg | ORAL_TABLET | Freq: Four times a day (QID) | ORAL | Status: DC | PRN
  Administered 2015-02-17 – 2015-02-19 (×4): 4 mg via ORAL
  Filled 2015-02-14 (×4): qty 1

## 2015-02-14 MED ORDER — LIDOCAINE-EPINEPHRINE (PF) 2 %-1:200000 IJ SOLN
INTRAMUSCULAR | Status: AC
  Filled 2015-02-14: qty 20

## 2015-02-14 MED ORDER — LEVOTHYROXINE SODIUM 25 MCG PO TABS
25.0000 ug | ORAL_TABLET | Freq: Every day | ORAL | Status: DC
  Administered 2015-02-16 – 2015-02-24 (×9): 25 ug via ORAL
  Filled 2015-02-14 (×9): qty 1

## 2015-02-14 MED ORDER — PANTOPRAZOLE SODIUM 40 MG PO TBEC
40.0000 mg | DELAYED_RELEASE_TABLET | Freq: Every day | ORAL | Status: DC
  Administered 2015-02-16 – 2015-02-22 (×7): 40 mg via ORAL
  Filled 2015-02-14 (×7): qty 1

## 2015-02-14 MED ORDER — SODIUM CHLORIDE 0.9 % IV BOLUS (SEPSIS)
1000.0000 mL | Freq: Once | INTRAVENOUS | Status: AC
  Administered 2015-02-14: 1000 mL via INTRAVENOUS

## 2015-02-14 MED ORDER — DEXTROSE 5 % IV SOLN
0.0000 ug/min | INTRAVENOUS | Status: DC
  Administered 2015-02-14: 200 ug/min via INTRAVENOUS
  Filled 2015-02-14: qty 4

## 2015-02-14 MED ORDER — SERTRALINE HCL 50 MG PO TABS
100.0000 mg | ORAL_TABLET | Freq: Every day | ORAL | Status: DC
  Administered 2015-02-16 – 2015-02-24 (×9): 100 mg via ORAL
  Filled 2015-02-14 (×9): qty 2

## 2015-02-14 MED ORDER — EZETIMIBE 10 MG PO TABS
10.0000 mg | ORAL_TABLET | Freq: Every day | ORAL | Status: DC
  Administered 2015-02-16 – 2015-02-24 (×9): 10 mg via ORAL
  Filled 2015-02-14 (×9): qty 1

## 2015-02-14 MED ORDER — APIXABAN 5 MG PO TABS: 5.0000 mg | ORAL_TABLET | Freq: Two times a day (BID) | ORAL | Status: DC

## 2015-02-14 MED ORDER — PHENYLEPHRINE HCL 10 MG/ML IJ SOLN
INTRAMUSCULAR | Status: AC
  Filled 2015-02-14: qty 3

## 2015-02-14 MED ORDER — ONDANSETRON HCL 4 MG/2ML IJ SOLN
4.0000 mg | Freq: Four times a day (QID) | INTRAMUSCULAR | Status: DC | PRN
  Administered 2015-02-16 – 2015-02-22 (×8): 4 mg via INTRAVENOUS
  Filled 2015-02-14 (×8): qty 2

## 2015-02-14 MED ORDER — ZOLPIDEM TARTRATE 5 MG PO TABS
5.0000 mg | ORAL_TABLET | Freq: Every evening | ORAL | Status: DC | PRN
  Administered 2015-02-16 – 2015-02-23 (×6): 5 mg via ORAL
  Filled 2015-02-14 (×6): qty 1

## 2015-02-14 MED ORDER — SODIUM CHLORIDE 0.9 % IV BOLUS (SEPSIS)
1000.0000 mL | INTRAVENOUS | Status: AC
  Administered 2015-02-14: 1000 mL via INTRAVENOUS

## 2015-02-14 MED ORDER — APIXABAN 5 MG PO TABS: 2.5000 mg | ORAL_TABLET | Freq: Two times a day (BID) | ORAL | Status: DC

## 2015-02-14 MED ORDER — DEXTROSE 5 % IV SOLN
2.0000 g | Freq: Once | INTRAVENOUS | Status: AC
  Administered 2015-02-14: 2 g via INTRAVENOUS
  Filled 2015-02-14: qty 2

## 2015-02-14 MED ORDER — SODIUM CHLORIDE 0.9 % IV SOLN: Freq: Once | INTRAVENOUS | Status: DC

## 2015-02-14 MED ORDER — PHENYLEPHRINE HCL 10 MG/ML IJ SOLN
INTRAMUSCULAR | Status: AC
  Filled 2015-02-14: qty 1

## 2015-02-14 MED ORDER — ROSUVASTATIN CALCIUM 20 MG PO TABS
40.0000 mg | ORAL_TABLET | Freq: Every day | ORAL | Status: DC
  Administered 2015-02-16 – 2015-02-23 (×7): 40 mg via ORAL
  Filled 2015-02-14 (×8): qty 2

## 2015-02-14 MED ORDER — SODIUM CHLORIDE 0.9 % IV SOLN
INTRAVENOUS | Status: DC
  Administered 2015-02-14: 22:00:00 via INTRAVENOUS

## 2015-02-14 MED ORDER — VANCOMYCIN HCL IN DEXTROSE 1-5 GM/200ML-% IV SOLN
1000.0000 mg | Freq: Once | INTRAVENOUS | Status: AC
  Administered 2015-02-14: 1000 mg via INTRAVENOUS
  Filled 2015-02-14: qty 200

## 2015-02-14 NOTE — H&P (Addendum)
PCP:   [name], MD   Chief Complaint:  Altered mental status  HPI:  60 year old female who  has a past medical history of Diverticulosis; Pancreatitis (1980, 123456); chronic systolic heart failure; Ischemic cardiomyopathy; CAD (coronary artery disease); Persistent atrial fibrillation ([place]) (2015); Suicidal overdose ([place]) (10/03/13); Anxiety (2015 ); Psoriasis (1968); GERD (gastroesophageal reflux disease) (2015 ); Hyperlipidemia (2015); Hypertension (2015); ETOH abuse (1981); Hypothyroidism; PONV (postoperative nausea and vomiting); Mass of right breast on mammogram (07/14/2014); AICD (automatic cardioverter/defibrillator) present; Myocardial infarction ([place]); Anginal pain ([place]); Anemia; History of blood transfusion; Stroke [facility]) (2011); Depression; and S/P ICD (internal cardiac defibrillator) procedure, St. Jude device (09/14/2014). Today presents to the hospital with altered mental status. Patient resides at group home, 3 days ago had multiple teeth removed after that she was given pain medication and has been lethargic for the past 2 and half days. Patient had very poor by mouth intake and did not eat or drink anything. Today was found to be lethargic and was brought to the hospital.  In the ED patient was found to be hypothermic and hypotensive. Alert and oriented to self. Lab work revealed creatinine of 7.2 with baseline around 1. Patient started on beta blocker blanket for hypothermia, started IV fluid boluses for hypotension. UA showed possible UTI so started on vancomycin cefepime for possible sepsis. Lactic acid normal. Chest x-ray showed no pneumonia.  Patient unable to provide significant history. Though she is alert and oriented to self, but not very communicative at this time.  Allergies:   Allergies  Allergen Reactions  . Morphine And Related Other (See Comments)    Dizziness and hallucinations  . Penicillins Anaphylaxis and Rash    Has patient had a PCN reaction causing  immediate rash, facial/tongue/throat swelling, SOB or lightheadedness with hypotension:unknown Has patient had a PCN reaction causing severe rash involving mucus membranes or skin necrosis: unknown Has patient had a PCN reaction that required hospitalization unknown Has patient had a PCN reaction occurring within the last 10 years: unknown If all of the above answers are "NO", then may proceed with Cephalosporin use.       Past Medical History  Diagnosis Date  . Diverticulosis   . Pancreatitis 1980, 2015    in the setting of ETOH  . chronic systolic heart failure     a. ECHO (05/08/13): EF 20-25%, diff HK with akinesis of basal inferior wall, grade 1 DD, trivial MR, trivial TR b) RHC (05/06/13): RA 7, RV 30/2, PA 31/19 (24), PCWP 10, Fick CO/CI: 2.9/1.74, Thermo CO/Ci: 2.4/1.4, PA sat 53%  . Ischemic cardiomyopathy   . CAD (coronary artery disease)     a. LHC (04/2013): Chronically occluded LAD, moderate dz in large OM1 and severe dz and small posterior lateral vessel    . Persistent atrial fibrillation ([place]) 2015  . Suicidal overdose ([place]) 10/03/13    Overdoes on Beta Blockers & Xarelto [facility] ED)  . Anxiety 2015   . Psoriasis 1968  . GERD (gastroesophageal reflux disease) 2015   . Hyperlipidemia 2015  . Hypertension 2015  . ETOH abuse 1981    started abusing alcohol at age 51, quit   . Hypothyroidism   . PONV (postoperative nausea and vomiting)   . Mass of right breast on mammogram 07/14/2014  . AICD (automatic cardioverter/defibrillator) present   . Myocardial infarction ([place])     "I've had many" (09/13/2014)  . Anginal pain ([place])   . Anemia   . History of blood transfusion     "  related to losing blood from somewhere; never found out from where"  . Stroke [facility]) 2011    left side weakness, minimal  . Depression   . 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/2014    Past Surgical History  Procedure Laterality Date  . Colostomy takedown  2007  .  Abdominal exploration surgery  2007  . Esophagogastroduodenoscopy (egd) with propofol N/A 11/22/2013    Dr. Oneida Alar: gastritis and duodenitis, negative H.pylori  . Esophageal biopsy N/A 11/22/2013    Procedure: GASTRIC BIOPSIES;  Surgeon: Danie Binder, MD;  Location: [facility] ORS;  Service: Endoscopy;  Laterality: N/A;  . Left and right heart catheterization with coronary angiogram N/A 05/10/2013    Procedure: LEFT AND RIGHT HEART CATHETERIZATION WITH CORONARY ANGIOGRAM;  Surgeon: [name], MD;  Location: [facility] CATH LAB;  Service: Cardiovascular;  Laterality: N/A;  . Left heart catheterization with coronary angiogram N/A 09/03/2013    Procedure: LEFT HEART CATHETERIZATION WITH CORONARY ANGIOGRAM;  Surgeon: Sinclair Grooms, MD;  Location: [facility] CATH LAB;  Service: Cardiovascular;  Laterality: N/A;  . Colonoscopy with propofol N/A 03/29/2014    SLF: 1. No definite source for anemia identified 2. 8 colon polyps removed 3. Severe diverticulosis noted the transverse colon  and right colon 4. Small internal hemorrohids.   . Polypectomy N/A 03/29/2014    Procedure: POLYPECTOMY;  Surgeon: Danie Binder, MD;  Location: [facility] ORS;  Service: Endoscopy;  Laterality: N/A;  ascending colon, sigmoid colon x4, rectal x3  . Left and right heart catheterization with coronary angiogram N/A 04/14/2014    Procedure: LEFT AND RIGHT HEART CATHETERIZATION WITH CORONARY ANGIOGRAM;  Surgeon: Larey Dresser, MD;  Location: [facility] CATH LAB;  Service: Cardiovascular;  Laterality: N/A;  . Agile capsule N/A 06/02/2014    Procedure: AGILE CAPSULE;  Surgeon: Danie Binder, MD;  Location: [facility] ENDO SUITE;  Service: Endoscopy;  Laterality: N/A;  0800  . Givens capsule study N/A 06/10/2014    Procedure: GIVENS CAPSULE STUDY;  Surgeon: Danie Binder, MD;  Location: [facility] ENDO SUITE;  Service: Endoscopy;  Laterality: N/A;  0700  . Ep implantable device N/A 09/13/2014    Procedure: ICD Implant;  Surgeon: [name], MD;  Location: [place] CV LAB;   Service: Cardiovascular;  Laterality: N/A;  . Colostomy reversal  ?2009  . Ileostomy  ?2009  . Ileostomy closure  ?2010  . Colon surgery    . Cardiac catheterization    . Ep implantable device  09/13/14    ICD St Jude, placed     Prior to Admission medications   Medication Sig Start Date End Date Taking? Authorizing Provider  acetaminophen (TYLENOL) 325 MG tablet Take 650 mg by mouth every 6 (six) hours as needed for mild pain.   Yes Historical Provider, MD  ammonium lactate (AMLACTIN) 12 % cream Apply 1 g topically daily as needed for dry skin (apply to elbows).    Yes Historical Provider, MD  apixaban (ELIQUIS) 5 MG TABS tablet Take 1 tablet (5 mg total) by mouth 2 (two) times daily. 12/30/13  Yes Amy D Clegg, NP  carvedilol (COREG) 12.5 MG tablet Take 1 tablet (12.5 mg total) by mouth 2 (two) times daily. 01/18/15  Yes Larey Dresser, MD  ezetimibe (ZETIA) 10 MG tablet Take 1 tablet (10 mg total) by mouth daily. 01/20/15  Yes Larey Dresser, MD  furosemide (LASIX) 20 MG tablet Take 1 tablet (20 mg total) by mouth every  other day. 05/06/14  Yes Larey Dresser, MD  levothyroxine (SYNTHROID, LEVOTHROID) 25 MCG tablet Take 25 mcg by mouth daily before breakfast.    Yes Historical Provider, MD  LORazepam (ATIVAN) 0.5 MG tablet Take 1 tablet (0.5 mg total) by mouth 2 (two) times daily. 12/30/13  Yes Josalyn Funches, MD  nitroGLYCERIN (NITROSTAT) 0.3 MG SL tablet Place 0.3 mg under the tongue every 5 (five) minutes as needed for chest pain (MAX 3 TABLETS). Reported on 01/18/2015   Yes Historical Provider, MD  ondansetron (ZOFRAN) 4 MG tablet Take 4 mg by mouth every 8 (eight) hours as needed for nausea or vomiting.   Yes Historical Provider, MD  pantoprazole (PROTONIX) 40 MG tablet TAKE 1 TABLET BY MOUTH TWICE DAILY BEFORE A MEAL. 02/25/14  Yes Carlis Stable, NP  rosuvastatin (CRESTOR) 40 MG tablet Take 1 tablet (40 mg total) by mouth daily. D/C order for Atorvastatin 11/22/14  Yes Larey Dresser, MD   sacubitril-valsartan (ENTRESTO) 24-26 MG Take 1 tablet by mouth 2 (two) times daily. 12/21/14  Yes Larey Dresser, MD  sertraline (ZOLOFT) 50 MG tablet Take 100 mg by mouth daily.   Yes Historical Provider, MD  spironolactone (ALDACTONE) 25 MG tablet Take 0.5 tablets (12.5 mg total) by mouth daily. 05/06/14  Yes Larey Dresser, MD  zolpidem (AMBIEN) 10 MG tablet Take 1 tablet (10 mg total) by mouth at bedtime as needed for sleep. 01/18/15 02/17/15 Yes Larey Dresser, MD    Social History:  reports that she has been smoking Cigarettes.  She has a 5.76 pack-year smoking history. She has never used smokeless tobacco. She reports that she does not drink alcohol or use illicit drugs.  Family History  Problem Relation Age of Onset  . CAD Father 63    MI  . CAD Mother 10    MI  . Alcohol abuse Mother   . Colon cancer Mother     in her 43s  . CAD Sister 90    Stent  . Diabetes Sister   . CAD Brother 69    MI  . Cancer Neg Hx     Filed Weights   02/14/15 1408  Weight: 54.432 kg (120 lb)      Review of Systems:  As the history of present illness. Rest of the review of systems unobtainable   Physical Exam: Blood pressure 98/69, pulse 75, temperature 96.6 F (35.9 C), temperature source Rectal, resp. rate 16, height 5\' 5"  (1.651 m), weight 54.432 kg (120 lb), last menstrual period 01/22/1988, SpO2 95 %. Constitutional:   Patient is a well-developed and well-nourished female* in no acute distress and cooperative with exam. Head: Normocephalic and atraumatic Mouth: Mucus membranes moist Eyes: PERRL, EOMI, conjunctivae normal Neck: Supple, No Thyromegaly Cardiovascular: RRR, S1 normal, S2 normal Pulmonary/Chest: CTAB, no wheezes, rales, or rhonchi Abdominal: Soft. Non-tender, non-distended, bowel sounds are normal, no masses, organomegaly, or guarding present.  Neurological: A&O x3, Strength is normal and symmetric bilaterally, cranial nerve II-XII are grossly intact, no focal  motor deficit, sensory intact to light touch bilaterally.  Extremities : No Cyanosis, Clubbing or Edema  Labs on Admission:  Basic Metabolic Panel:  Recent Labs Lab 02/14/15 1430  NA 137  K 5.5*  CL 101  CO2 18*  GLUCOSE 108*  BUN 65*  CREATININE 7.97*  CALCIUM 7.7*   Liver Function Tests:  Recent Labs Lab 02/14/15 1430  AST 22  ALT 14  ALKPHOS 88  BILITOT 0.6  PROT 7.2  ALBUMIN 3.5   No results for input(s): LIPASE, AMYLASE in the last 168 hours. No results for input(s): AMMONIA in the last 168 hours. CBC:  Recent Labs Lab 02/14/15 1430  WBC 20.9*  HGB 10.4*  HCT 30.9*  MCV 89.8  PLT 153   Cardiac Enzymes:  Recent Labs Lab 02/14/15 1430  CKTOTAL 350*  TROPONINI 0.03    BNP (last 3 results)  Recent Labs  04/09/14 0038  BNP 1385.0*    ProBNP (last 3 results) No results for input(s): PROBNP in the last 8760 hours.  CBG:  Recent Labs Lab 02/14/15 1438  GLUCAP 99    Radiological Exams on Admission: Ct Head Wo Contrast  02/14/2015  CLINICAL DATA:  Altered mental status and fatigue since having 3 teeth pulled 3 days ago. EXAM: CT HEAD WITHOUT CONTRAST TECHNIQUE: Contiguous axial images were obtained from the base of the skull through the vertex without intravenous contrast. COMPARISON:  10/02/2008 FINDINGS: Multiple small, chronic infarcts are again seen in the right MCA territory involving the frontal, parietal, and temporal lobes as well as basal ganglia. There is mild ex vacuo dilatation of the right lateral ventricle. There is no evidence of acute large territory infarct, intracranial hemorrhage, mass effect, or extra-axial fluid collection. Orbits are unremarkable. The visualized paranasal sinuses are clear. There are small, chronic bilateral mastoid effusions. Calcified atherosclerosis is noted at the skull base. IMPRESSION: 1. No evidence of acute intracranial abnormality. 2. Chronic right MCA territory infarcts. Electronically Signed   By:  Logan Bores M.D.   On: 02/14/2015 16:57   Dg Chest Port 1 View  02/14/2015  CLINICAL DATA:  60 year old with acute onset of fatigue and mental status changes approximately 3 days ago related to having had several teeth pulled. Indwelling pacemaker. EXAM: PORTABLE CHEST 1 VIEW COMPARISON:  10/15/2014 and earlier. FINDINGS: Suboptimal inspiration accounts for crowded bronchovascular markings, especially in the bases, and accentuates the cardiac silhouette. Taking this into account, cardiac silhouette upper normal in size for AP portable technique. Lungs clear. Bronchovascular markings normal. Pulmonary vascularity normal. No visible pleural effusions. No pneumothorax. Left subclavian single lead transvenous pacemaker unchanged and appears intact. IMPRESSION: Suboptimal inspiration.  No acute cardiopulmonary disease. Electronically Signed   By: Evangeline Dakin M.D.   On: 02/14/2015 15:05    EKG: Independently reviewed. Normal sinus rhythm   Assessment/Plan Active Problems:   Cardiomyopathy, ischemic- EF 25-30% 2D 09/02/13   Hypothyroidism   S/P ICD (internal cardiac defibrillator) procedure, St. Jude device, 09/13/14   Sepsis ([place])   AKI (acute kidney injury) ([place])  Sepsis Patient presenting with criteria for sepsis, started vancomycin and cefepime per pharmacy. Follow blood culture results. Lactic acid is normal. Likely source from UTI. Check lactic acid every 3 hours. Patient has already received 2 L of IV fluid bolus, has EF 25-30% with ischemic cardiopathy. We will be very cautious with aggressive IV hydration. Will continue with normal saline at 100 Ml per hour.  Hypothermia Started [facility] , temperature is improving.  Hypotension As above patient has received IV fluid boluses 2 L in the ED. Start normal saline at 100 mL per hour. In case patient needs pressor support will start Neo-Synephrine. Called and discussed with cardiology Dr. Ala Dach I Patient presenting with  acute kidney injury with creatinine 7.97 with baseline around 1. Likely from hypotension, continue IV fluids as above. Consult nephrology in a.m. Will check urine sodium and creatinine. Also check renal ultrasound.  Ischemic Cardio  myopathy, status post AICD Patient has cardiomyopathy, ischemic EF 25-30% Continue IV fluids with caution.   History of proximal A. Fib Continue eliquis   Code status: Full code  Family discussion: No family present at bedside   Time Spent on Admission: 60 min  [facility] Pager: [phone] 02/14/2015, 5:18 PM  If 7PM-7AM, please contact night-coverage  [email or web address]  Password [email or web address]

## 2015-02-14 NOTE — ED Notes (Signed)
Pt's sister Santiago Glad is next of kin and lives in [place].  Her number is [phone].

## 2015-02-14 NOTE — Progress Notes (Signed)
RT unable to get ALINE. RN notified

## 2015-02-14 NOTE — ED Notes (Signed)
Bear hugger applied to pt.  Lab attempting to obtain 2nd set of blood cultures.

## 2015-02-14 NOTE — ED Notes (Signed)
Pt rolled over in bed and removed IV access

## 2015-02-14 NOTE — ED Notes (Signed)
Patient had teeth pulled three days ago. Care home reports pt has been fatigued and altered since that she has been laying in bed. Patient normally up and constantly smoking to which she has not had a cigarette in 3 days. Patient moaning, will not answer questions, but alert to verbal stimuli.

## 2015-02-14 NOTE — ED Provider Notes (Signed)
CSN: [id number]     Arrival date & time 02/14/15  1359 History   First MD Initiated Contact with Patient 02/14/15 1458     Chief Complaint  Patient presents with  . Altered Mental Status     (Consider location/radiation/quality/duration/timing/severity/associated sxs/prior Treatment) HPI  Clinical caveat due to altered mental status. History is provided by EMS 60 year old female who presents with altered mental status. History of ischemic cardiomyopathy with EF of 20-25% status post AICD, atrial fibrillation on apixaban, hyperlipidemia and hypertension. Presents from a group home, where 3 days ago she had multiple teeth removed. Since then she has been in multiple in bed over the past 2-1/2 days. Has not eaten or drank anything over past 2 days. Found lying on her medications today.   Past Medical History  Diagnosis Date  . Diverticulosis   . Pancreatitis 1980, 2015    in the setting of ETOH  . chronic systolic heart failure     a. ECHO (05/08/13): EF 20-25%, diff HK with akinesis of basal inferior wall, grade 1 DD, trivial MR, trivial TR b) RHC (05/06/13): RA 7, RV 30/2, PA 31/19 (24), PCWP 10, Fick CO/CI: 2.9/1.74, Thermo CO/Ci: 2.4/1.4, PA sat 53%  . Ischemic cardiomyopathy   . CAD (coronary artery disease)     a. LHC (04/2013): Chronically occluded LAD, moderate dz in large OM1 and severe dz and small posterior lateral vessel    . Persistent atrial fibrillation ([place]) 2015  . Suicidal overdose ([place]) 10/03/13    Overdoes on Beta Blockers & Xarelto [facility] ED)  . Anxiety 2015   . Psoriasis 1968  . GERD (gastroesophageal reflux disease) 2015   . Hyperlipidemia 2015  . Hypertension 2015  . ETOH abuse 1981    started abusing alcohol at age 40, quit   . Hypothyroidism   . PONV (postoperative nausea and vomiting)   . Mass of right breast on mammogram 07/14/2014  . AICD (automatic cardioverter/defibrillator) present   . Myocardial infarction ([place])     "I've had many" (09/13/2014)  .  Anginal pain ([place])   . Anemia   . History of blood transfusion     "related to losing blood from somewhere; never found out from where"  . Stroke [facility]) 2011    left side weakness, minimal  . Depression   . 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/2014   Past Surgical History  Procedure Laterality Date  . Colostomy takedown  2007  . Abdominal exploration surgery  2007  . Esophagogastroduodenoscopy (egd) with propofol N/A 11/22/2013    Dr. Oneida Alar: gastritis and duodenitis, negative H.pylori  . Esophageal biopsy N/A 11/22/2013    Procedure: GASTRIC BIOPSIES;  Surgeon: Danie Binder, MD;  Location: [facility] ORS;  Service: Endoscopy;  Laterality: N/A;  . Left and right heart catheterization with coronary angiogram N/A 05/10/2013    Procedure: LEFT AND RIGHT HEART CATHETERIZATION WITH CORONARY ANGIOGRAM;  Surgeon: [name], MD;  Location: [facility] CATH LAB;  Service: Cardiovascular;  Laterality: N/A;  . Left heart catheterization with coronary angiogram N/A 09/03/2013    Procedure: LEFT HEART CATHETERIZATION WITH CORONARY ANGIOGRAM;  Surgeon: Sinclair Grooms, MD;  Location: [facility] CATH LAB;  Service: Cardiovascular;  Laterality: N/A;  . Colonoscopy with propofol N/A 03/29/2014    SLF: 1. No definite source for anemia identified 2. 8 colon polyps removed 3. Severe diverticulosis noted the transverse colon  and right colon 4. Small internal hemorrohids.   . Polypectomy N/A 03/29/2014  Procedure: POLYPECTOMY;  Surgeon: Danie Binder, MD;  Location: [facility] ORS;  Service: Endoscopy;  Laterality: N/A;  ascending colon, sigmoid colon x4, rectal x3  . Left and right heart catheterization with coronary angiogram N/A 04/14/2014    Procedure: LEFT AND RIGHT HEART CATHETERIZATION WITH CORONARY ANGIOGRAM;  Surgeon: Larey Dresser, MD;  Location: [facility] CATH LAB;  Service: Cardiovascular;  Laterality: N/A;  . Agile capsule N/A 06/02/2014    Procedure: AGILE CAPSULE;  Surgeon: Danie Binder, MD;   Location: [facility] ENDO SUITE;  Service: Endoscopy;  Laterality: N/A;  0800  . Givens capsule study N/A 06/10/2014    Procedure: GIVENS CAPSULE STUDY;  Surgeon: Danie Binder, MD;  Location: [facility] ENDO SUITE;  Service: Endoscopy;  Laterality: N/A;  0700  . Ep implantable device N/A 09/13/2014    Procedure: ICD Implant;  Surgeon: [name], MD;  Location: [place] CV LAB;  Service: Cardiovascular;  Laterality: N/A;  . Colostomy reversal  ?2009  . Ileostomy  ?2009  . Ileostomy closure  ?2010  . Colon surgery    . Cardiac catheterization    . Ep implantable device  09/13/14    ICD St Jude, placed    Family History  Problem Relation Age of Onset  . CAD Father 66    MI  . CAD Mother 69    MI  . Alcohol abuse Mother   . Colon cancer Mother     in her 60s  . CAD Sister 43    Stent  . Diabetes Sister   . CAD Brother 45    MI  . Cancer Neg Hx    Social History  Substance Use Topics  . Smoking status: Current Some Day Smoker -- 0.12 packs/day for 48 years    Types: Cigarettes  . Smokeless tobacco: Never Used  . Alcohol Use: No     Comment: last drink in [id number], former alcoholic   OB History    No data available     Review of Systems  Unable to perform ROS: Mental status change      Allergies  Morphine and related and Penicillins  Home Medications   Prior to Admission medications   Medication Sig Start Date End Date Taking? Authorizing Provider  acetaminophen (TYLENOL) 325 MG tablet Take 650 mg by mouth every 6 (six) hours as needed for mild pain.   Yes Historical Provider, MD  ammonium lactate (AMLACTIN) 12 % cream Apply 1 g topically daily as needed for dry skin (apply to elbows).    Yes Historical Provider, MD  apixaban (ELIQUIS) 5 MG TABS tablet Take 1 tablet (5 mg total) by mouth 2 (two) times daily. 12/30/13  Yes Amy D Clegg, NP  carvedilol (COREG) 12.5 MG tablet Take 1 tablet (12.5 mg total) by mouth 2 (two) times daily. 01/18/15  Yes Larey Dresser, MD  ezetimibe  (ZETIA) 10 MG tablet Take 1 tablet (10 mg total) by mouth daily. 01/20/15  Yes Larey Dresser, MD  furosemide (LASIX) 20 MG tablet Take 1 tablet (20 mg total) by mouth every other day. 05/06/14  Yes Larey Dresser, MD  levothyroxine (SYNTHROID, LEVOTHROID) 25 MCG tablet Take 25 mcg by mouth daily before breakfast.    Yes Historical Provider, MD  LORazepam (ATIVAN) 0.5 MG tablet Take 1 tablet (0.5 mg total) by mouth 2 (two) times daily. 12/30/13  Yes Josalyn Funches, MD  nitroGLYCERIN (NITROSTAT) 0.3 MG SL tablet Place 0.3 mg under the tongue every 5 (five)  minutes as needed for chest pain (MAX 3 TABLETS). Reported on 01/18/2015   Yes Historical Provider, MD  ondansetron (ZOFRAN) 4 MG tablet Take 4 mg by mouth every 8 (eight) hours as needed for nausea or vomiting.   Yes Historical Provider, MD  pantoprazole (PROTONIX) 40 MG tablet TAKE 1 TABLET BY MOUTH TWICE DAILY BEFORE A MEAL. 02/25/14  Yes Carlis Stable, NP  rosuvastatin (CRESTOR) 40 MG tablet Take 1 tablet (40 mg total) by mouth daily. D/C order for Atorvastatin 11/22/14  Yes Larey Dresser, MD  sacubitril-valsartan (ENTRESTO) 24-26 MG Take 1 tablet by mouth 2 (two) times daily. 12/21/14  Yes Larey Dresser, MD  sertraline (ZOLOFT) 50 MG tablet Take 100 mg by mouth daily.   Yes Historical Provider, MD  spironolactone (ALDACTONE) 25 MG tablet Take 0.5 tablets (12.5 mg total) by mouth daily. 05/06/14  Yes Larey Dresser, MD  zolpidem (AMBIEN) 10 MG tablet Take 1 tablet (10 mg total) by mouth at bedtime as needed for sleep. 01/18/15 02/17/15 Yes Larey Dresser, MD   BP 54/29 mmHg  Pulse 77  Temp(Src) 96.6 F (35.9 C) (Rectal)  Resp 12  Ht 5\' 5"  (1.651 m)  Wt 120 lb (54.432 kg)  BMI 19.97 kg/m2  SpO2 87%  LMP 01/22/1988 (Approximate) Physical Exam Physical Exam  Nursing note and vitals reviewed. Constitutional: Listless, thin, ill appearing Head: Normocephalic and atraumatic.  Mouth/Throat: Oropharynx is clear. Extremely dry mucous  membranes. adendutous  Neck: Normal range of motion. Neck supple.  Cardiovascular: Normal rate and regular rhythm.   Pulmonary/Chest: Effort normal and breath sounds normal.  Abdominal: Soft. There is no tenderness. There is no rebound and no guarding.  Musculoskeletal: No deformities. Neurological: Alert, oriented only to self, no facial droop, fluent speech, moves all extremities to command Skin: Skin is warm and dry.  Psychiatric: Cooperative  ED Course  Angiocath insertion Date/Time: 02/14/2015 3:41 PM Performed by: Brantley Stage DUO Authorized by: Brantley Stage DUO Consent: The procedure was performed in an emergent situation. Verbal consent obtained. Risks and benefits: risks, benefits and alternatives were discussed Consent given by: patient Patient identity confirmed: verbally with patient Time out: Immediately prior to procedure a "time out" was called to verify the correct patient, procedure, equipment, support staff and site/side marked as required. Preparation: Patient was prepped and draped in the usual sterile fashion. Local anesthesia used: no Patient sedated: no Patient tolerance: Patient tolerated the procedure well with no immediate complications  .Central Line Date/Time: 02/14/2015 5:56 PM Performed by: Brantley Stage DUO Authorized by: Brantley Stage DUO Consent: Verbal consent obtained. Risks and benefits: risks, benefits and alternatives were discussed Consent given by: patient Patient identity confirmed: verbally with patient Time out: Immediately prior to procedure a "time out" was called to verify the correct patient, procedure, equipment, support staff and site/side marked as required. Indications: vascular access Anesthesia: local infiltration Local anesthetic: lidocaine 1% without epinephrine Anesthetic total: 4 ml Patient sedated: no Preparation: skin prepped with 2% chlorhexidine Skin prep agent dried: skin prep agent completely dried prior to procedure Sterile  barriers: all five maximum sterile barriers used - cap, mask, sterile gown, sterile gloves, and large sterile sheet Hand hygiene: hand hygiene performed prior to central venous catheter insertion Location details: right internal jugular Patient position: Trendelenburg Catheter type: triple lumen Catheter size: 9 Fr Pre-procedure: landmarks identified Ultrasound guidance: yes Sterile ultrasound techniques: sterile gel and sterile probe covers were used Number of attempts: 1 Successful placement: yes Post-procedure: line  sutured and dressing applied Assessment: blood return through all ports,  free fluid flow,  placement verified by x-ray and no pneumothorax on x-ray Patient tolerance: Patient tolerated the procedure well with no immediate complications   (including critical care time) Labs Review Labs Reviewed  COMPREHENSIVE METABOLIC PANEL - Abnormal; Notable for the following:    Potassium 5.5 (*)    CO2 18 (*)    Glucose, Bld 108 (*)    BUN 65 (*)    Creatinine, Ser 7.97 (*)    Calcium 7.7 (*)    GFR calc non Af Amer 5 (*)    GFR calc Af Amer 6 (*)    Anion gap 18 (*)    All other components within normal limits  CBC - Abnormal; Notable for the following:    WBC 20.9 (*)    RBC 3.44 (*)    Hemoglobin 10.4 (*)    HCT 30.9 (*)    All other components within normal limits  URINALYSIS, ROUTINE W REFLEX MICROSCOPIC (NOT AT [facility]) - Abnormal; Notable for the following:    APPearance HAZY (*)    Specific Gravity, Urine >1.030 (*)    Hgb urine dipstick MODERATE (*)    Bilirubin Urine SMALL (*)    Ketones, ur TRACE (*)    Protein, ur 100 (*)    Leukocytes, UA SMALL (*)    All other components within normal limits  URINE RAPID DRUG SCREEN, HOSP PERFORMED - Abnormal; Notable for the following:    Opiates POSITIVE (*)    Benzodiazepines POSITIVE (*)    All other components within normal limits  CK - Abnormal; Notable for the following:    Total CK 350 (*)    All other  components within normal limits  URINE MICROSCOPIC-ADD ON - Abnormal; Notable for the following:    Squamous Epithelial / LPF 6-30 (*)    Bacteria, UA MANY (*)    All other components within normal limits  CULTURE, BLOOD (ROUTINE X 2)  CULTURE, BLOOD (ROUTINE X 2)  URINE CULTURE  TROPONIN I  CBG MONITORING, ED  I-STAT CG4 LACTIC ACID, ED  I-STAT CG4 LACTIC ACID, ED    Imaging Review Ct Head Wo Contrast  02/14/2015  CLINICAL DATA:  Altered mental status and fatigue since having 3 teeth pulled 3 days ago. EXAM: CT HEAD WITHOUT CONTRAST TECHNIQUE: Contiguous axial images were obtained from the base of the skull through the vertex without intravenous contrast. COMPARISON:  10/02/2008 FINDINGS: Multiple small, chronic infarcts are again seen in the right MCA territory involving the frontal, parietal, and temporal lobes as well as basal ganglia. There is mild ex vacuo dilatation of the right lateral ventricle. There is no evidence of acute large territory infarct, intracranial hemorrhage, mass effect, or extra-axial fluid collection. Orbits are unremarkable. The visualized paranasal sinuses are clear. There are small, chronic bilateral mastoid effusions. Calcified atherosclerosis is noted at the skull base. IMPRESSION: 1. No evidence of acute intracranial abnormality. 2. Chronic right MCA territory infarcts. Electronically Signed   By: Logan Bores M.D.   On: 02/14/2015 16:57   Dg Chest Port 1 View  02/14/2015  CLINICAL DATA:  60 year old with acute onset of fatigue and mental status changes approximately 3 days ago related to having had several teeth pulled. Indwelling pacemaker. EXAM: PORTABLE CHEST 1 VIEW COMPARISON:  10/15/2014 and earlier. FINDINGS: Suboptimal inspiration accounts for crowded bronchovascular markings, especially in the bases, and accentuates the cardiac silhouette. Taking this into account, cardiac silhouette upper  normal in size for [facility] portable technique. Lungs clear.  Bronchovascular markings normal. Pulmonary vascularity normal. No visible pleural effusions. No pneumothorax. Left subclavian single lead transvenous pacemaker unchanged and appears intact. IMPRESSION: Suboptimal inspiration.  No acute cardiopulmonary disease. Electronically Signed   By: Evangeline Dakin M.D.   On: 02/14/2015 15:05   I have personally reviewed and evaluated these images and lab results as part of my medical decision-making.   EKG Interpretation   Date/Time:  Tuesday February 14 2015 14:06:15 EST Ventricular Rate:  71 PR Interval:  150 QRS Duration: 164 QT Interval:  448 QTC Calculation: 487 R Axis:   116 Text Interpretation:  Sinus rhythm Nonspecific intraventricular conduction  delay Borderline T abnormalities, lateral leads Confirmed by Hashem Goynes MD, Sharron Petruska  [phone]) on 02/14/2015 2:59:45 PM      CRITICAL CARE Performed by: Forde Dandy   Total critical care time: 35 minutes  Critical care time was exclusive of separately billable procedures and treating other patients.  Critical care was necessary to treat or prevent imminent or life-threatening deterioration.  Critical care was time spent personally by me on the following activities: development of treatment plan with patient and/or surrogate as well as nursing, discussions with consultants, evaluation of patient's response to treatment, examination of patient, obtaining history from patient or surrogate, ordering and performing treatments and interventions, ordering and review of laboratory studies, ordering and review of radiographic studies, pulse oximetry and re-evaluation of patient's condition.   MDM   Final diagnoses:  Sepsis, due to unspecified organism ([place])  Acute renal failure, unspecified acute renal failure type ([place])  Hypovolemia  Hypothermia, initial encounter  Hypotension, unspecified hypotension type   60 year old female who presents with altered mental status. He is hypothermic and hypotensive on  arrival. Is alert, only oriented to self. Grossly neurologically intact. Appears extremely dry and dehydrated. IVC very small and collapsible on bedside US. Given 2 L of IVC, with response in blood pressure.   Presentation concerning for acute renal failure. CK 200s. She has a creatinine of > 7with a baseline of around 1.4 one month ago. Potassium 5.5. Currently, no emergent need for dialysis at this time. Also concern for sepsis given hypothermia, hypotension, leukocytosis of 20. Covered empirically with vancomycin and cefepime. Blood cultures UA and urine cultures are obtained. Chest x-ray without pneumonia. Lactic acid is normal.  Multiple PIVs blown and inability to identify more visually or by [place]. Central line placed subsequently.   Discussed with Dr. Darrick Meigs and admitted to ICU      Forde Dandy, MD 02/14/15 1800

## 2015-02-14 NOTE — ED Notes (Signed)
Central line repositioned by Dr. Oleta Mouse. Awaiting repeat xray for placement verification before using.

## 2015-02-14 NOTE — ED Notes (Signed)
Dr. Oleta Mouse notified of infiltration.

## 2015-02-14 NOTE — ED Notes (Signed)
MD aware of loss of IV access and blood pressure.  Preparing for central line placement.  No fluids or abx infusing at this time.  Pt remains alert.  BP verified by manual reading.

## 2015-02-14 NOTE — Progress Notes (Signed)
Pharmacy Note:  Initial antibiotics for Vancomycin and Cefepime ordered by MD for Sepsis.  Estimated Creatinine Clearance: 6.5 mL/min (by C-G formula based on Cr of 7.97).   Allergies  Allergen Reactions  . Morphine And Related Other (See Comments)    Dizziness and hallucinations  . Penicillins Anaphylaxis and Rash    Has patient had a PCN reaction causing immediate rash, facial/tongue/throat swelling, SOB or lightheadedness with hypotension:unknown Has patient had a PCN reaction causing severe rash involving mucus membranes or skin necrosis: unknown Has patient had a PCN reaction that required hospitalization unknown Has patient had a PCN reaction occurring within the last 10 years: unknown If all of the above answers are "NO", then may proceed with Cephalosporin use.     Filed Vitals:   02/14/15 1845 02/14/15 1945  BP: 83/58   Pulse: 75   Temp:  97 F (36.1 C)  Resp: 20     Anti-infectives    Start     Dose/Rate Route Frequency Ordered Stop   02/14/15 1530  vancomycin (VANCOCIN) IVPB 1000 mg/200 mL premix     1,000 mg 200 mL/hr over 60 Minutes Intravenous  Once 02/14/15 1526 02/14/15 1747   02/14/15 1530  ceFEPIme (MAXIPIME) 2 g in dextrose 5 % 50 mL IVPB     2 g 100 mL/hr over 30 Minutes Intravenous  Once 02/14/15 1526 02/14/15 1648      Assessment / Plan: Initial doses of Vancomycin and Cefepime X 1 ordered in ED. Acute renal failure, initial doses will cover patient until labs can be evaluated again in AM for routine dosing. PCN allergy noted, but appears patient tolerated Rocephin in 2015. F/U AM for continued regimen.  Additionally, I have adjusted to 2.5mg  PO BID due to weight < 60kg and elevated Creatinine.  After speaking w/ Dr. Darrick Meigs we will start this dose tomorrow morning since unsure if patient received any 5mg  doses today.  Pricilla Larsson, [facility] 02/14/2015 8:47 PM

## 2015-02-15 ENCOUNTER — Inpatient Hospital Stay (HOSPITAL_COMMUNITY): Payer: Medicaid Other

## 2015-02-15 DIAGNOSIS — I255 Ischemic cardiomyopathy: Secondary | ICD-10-CM

## 2015-02-15 DIAGNOSIS — I480 Paroxysmal atrial fibrillation: Secondary | ICD-10-CM

## 2015-02-15 DIAGNOSIS — Z7901 Long term (current) use of anticoagulants: Secondary | ICD-10-CM

## 2015-02-15 DIAGNOSIS — I5022 Chronic systolic (congestive) heart failure: Secondary | ICD-10-CM

## 2015-02-15 DIAGNOSIS — N179 Acute kidney failure, unspecified: Secondary | ICD-10-CM

## 2015-02-15 DIAGNOSIS — Z95810 Presence of automatic (implantable) cardiac defibrillator: Secondary | ICD-10-CM

## 2015-02-15 LAB — COMPREHENSIVE METABOLIC PANEL
ALBUMIN: 2.5 g/dL — AB (ref 3.5–5.0)
ALT: 14 U/L (ref 14–54)
ALT: 15 U/L (ref 14–54)
ANION GAP: 10 (ref 5–15)
AST: 22 U/L (ref 15–41)
AST: 26 U/L (ref 15–41)
Albumin: 2.5 g/dL — ABNORMAL LOW (ref 3.5–5.0)
Alkaline Phosphatase: 75 U/L (ref 38–126)
Alkaline Phosphatase: 84 U/L (ref 38–126)
Anion gap: 12 (ref 5–15)
BUN: 63 mg/dL — AB (ref 6–20)
BUN: 62 mg/dL — ABNORMAL HIGH (ref 6–20)
CHLORIDE: 110 mmol/L (ref 101–111)
CO2: 14 mmol/L — AB (ref 22–32)
CO2: 14 mmol/L — AB (ref 22–32)
Calcium: 5.8 mg/dL — CL (ref 8.9–10.3)
Calcium: 6.1 mg/dL — CL (ref 8.9–10.3)
Chloride: 113 mmol/L — ABNORMAL HIGH (ref 101–111)
Creatinine, Ser: 6.51 mg/dL — ABNORMAL HIGH (ref 0.44–1.00)
Creatinine, Ser: 6.42 mg/dL — ABNORMAL HIGH (ref 0.44–1.00)
GFR calc Af Amer: 7 mL/min — ABNORMAL LOW (ref >60)
GFR calc Af Amer: 7 mL/min — ABNORMAL LOW (ref >60)
GFR calc non Af Amer: 6 mL/min — ABNORMAL LOW (ref >60)
GFR, EST NON AFRICAN AMERICAN: 6 mL/min — AB (ref >60)
GLUCOSE: 126 mg/dL — AB (ref 65–99)
Glucose, Bld: 142 mg/dL — ABNORMAL HIGH (ref 65–99)
POTASSIUM: 4.8 mmol/L (ref 3.5–5.1)
POTASSIUM: 4.8 mmol/L (ref 3.5–5.1)
SODIUM: 136 mmol/L (ref 135–145)
SODIUM: 137 mmol/L (ref 135–145)
TOTAL PROTEIN: 5.4 g/dL — AB (ref 6.5–8.1)
Total Bilirubin: 0.5 mg/dL (ref 0.3–1.2)
Total Bilirubin: 0.5 mg/dL (ref 0.3–1.2)
Total Protein: 5.4 g/dL — ABNORMAL LOW (ref 6.5–8.1)

## 2015-02-15 LAB — CBC
HCT: 28.3 % — ABNORMAL LOW (ref 36.0–46.0)
Hemoglobin: 9.5 g/dL — ABNORMAL LOW (ref 12.0–15.0)
MCH: 30.4 pg (ref 26.0–34.0)
MCHC: 33.6 g/dL (ref 30.0–36.0)
MCV: 90.7 fL (ref 78.0–100.0)
PLATELETS: 173 10*3/uL (ref 150–400)
RBC: 3.12 MIL/uL — ABNORMAL LOW (ref 3.87–5.11)
RDW: 13.3 % (ref 11.5–15.5)
WBC: 18.2 10*3/uL — AB (ref 4.0–10.5)

## 2015-02-15 LAB — PROTIME-INR
INR: 2.38 — ABNORMAL HIGH (ref 0.00–1.49)
Prothrombin Time: 25.7 seconds — ABNORMAL HIGH (ref 11.6–15.2)

## 2015-02-15 LAB — LACTIC ACID, PLASMA
LACTIC ACID, VENOUS: 0.5 mmol/L (ref 0.5–2.0)
LACTIC ACID, VENOUS: 0.4 mmol/L — AB (ref 0.5–2.0)

## 2015-02-15 LAB — TSH: TSH: 0.73 u[IU]/mL (ref 0.350–4.500)

## 2015-02-15 LAB — MAGNESIUM: Magnesium: 1.6 mg/dL — ABNORMAL LOW (ref 1.7–2.4)

## 2015-02-15 LAB — HEPARIN LEVEL (UNFRACTIONATED): Heparin Unfractionated: >2.2 IU/mL — ABNORMAL HIGH (ref 0.30–0.70)

## 2015-02-15 LAB — PROCALCITONIN: Procalcitonin: 4.24 ng/mL

## 2015-02-15 MED ORDER — DEXTROSE 5 % IV SOLN
1.0000 g | INTRAVENOUS | Status: DC
  Administered 2015-02-15 – 2015-02-16 (×2): 1 g via INTRAVENOUS
  Filled 2015-02-15 (×5): qty 1

## 2015-02-15 MED ORDER — NOREPINEPHRINE BITARTRATE 1 MG/ML IV SOLN
0.0000 ug/min | INTRAVENOUS | Status: DC
  Administered 2015-02-15: 25 ug/min via INTRAVENOUS
  Administered 2015-02-15: 40 ug/min via INTRAVENOUS
  Filled 2015-02-15 (×3): qty 16

## 2015-02-15 MED ORDER — HEPARIN BOLUS VIA INFUSION
4000.0000 [IU] | Freq: Once | INTRAVENOUS | Status: AC
  Administered 2015-02-15: 4000 [IU] via INTRAVENOUS
  Filled 2015-02-15: qty 4000

## 2015-02-15 MED ORDER — SODIUM BICARBONATE 8.4 % IV SOLN
INTRAVENOUS | Status: AC
  Filled 2015-02-15: qty 50

## 2015-02-15 MED ORDER — NOREPINEPHRINE BITARTRATE 1 MG/ML IV SOLN
INTRAVENOUS | Status: AC
  Filled 2015-02-15: qty 16

## 2015-02-15 MED ORDER — HEPARIN (PORCINE) IN NACL 100-0.45 UNIT/ML-% IJ SOLN: 700.0000 [IU]/h | INTRAMUSCULAR | Status: DC

## 2015-02-15 MED ORDER — SODIUM CHLORIDE 0.9 % IV SOLN
INTRAVENOUS | Status: AC
  Administered 2015-02-15: 06:00:00
  Filled 2015-02-15: qty 10

## 2015-02-15 MED ORDER — VANCOMYCIN HCL IN DEXTROSE 750-5 MG/150ML-% IV SOLN: 750.0000 mg | INTRAVENOUS | Status: DC

## 2015-02-15 MED ORDER — SODIUM CHLORIDE 0.9 % IV SOLN
1.0000 g | Freq: Once | INTRAVENOUS | Status: AC
  Administered 2015-02-15: 1 g via INTRAVENOUS

## 2015-02-15 MED ORDER — SODIUM CHLORIDE 0.9 % IV SOLN
INTRAVENOUS | Status: DC
  Administered 2015-02-15: 06:00:00 via INTRAVENOUS
  Filled 2015-02-15 (×4): qty 50

## 2015-02-15 MED ORDER — NOREPINEPHRINE BITARTRATE 1 MG/ML IV SOLN
INTRAVENOUS | Status: AC
  Filled 2015-02-15: qty 4

## 2015-02-15 MED ORDER — STERILE WATER FOR INJECTION IV SOLN
INTRAVENOUS | Status: DC
  Administered 2015-02-15 – 2015-02-17 (×5): via INTRAVENOUS
  Filled 2015-02-15 (×7): qty 850

## 2015-02-15 MED ORDER — HEPARIN (PORCINE) IN NACL 100-0.45 UNIT/ML-% IJ SOLN
950.0000 [IU]/h | INTRAMUSCULAR | Status: DC
  Administered 2015-02-15: 950 [IU]/h via INTRAVENOUS
  Filled 2015-02-15: qty 250

## 2015-02-15 MED ORDER — DEXTROSE 5 % IV SOLN
0.0000 ug/min | INTRAVENOUS | Status: DC
  Administered 2015-02-15: 10 ug/min via INTRAVENOUS
  Filled 2015-02-15: qty 4

## 2015-02-15 MED ORDER — SODIUM CHLORIDE 0.9 % IV BOLUS (SEPSIS)
500.0000 mL | Freq: Once | INTRAVENOUS | Status: AC
  Administered 2015-02-15: 500 mL via INTRAVENOUS

## 2015-02-15 NOTE — Progress Notes (Signed)
Pharmacy Note:  Initial antibiotics for Vancomycin and Cefepime ordered by MD for Sepsis.  Estimated Creatinine Clearance: 8 mL/min (by C-G formula based on Cr of 6.42).   Allergies  Allergen Reactions  . Morphine And Related Other (See Comments)    Dizziness and hallucinations  . Penicillins Anaphylaxis and Rash    Has patient had a PCN reaction causing immediate rash, facial/tongue/throat swelling, SOB or lightheadedness with hypotension:unknown Has patient had a PCN reaction causing severe rash involving mucus membranes or skin necrosis: unknown Has patient had a PCN reaction that required hospitalization unknown Has patient had a PCN reaction occurring within the last 10 years: unknown If all of the above answers are "NO", then may proceed with Cephalosporin use.     Filed Vitals:   02/15/15 0945 02/15/15 1000  BP: 93/42 76/45  Pulse: 87 87  Temp: 98.6 F (37 C) 98.6 F (37 C)  Resp: 19 20    Anti-infectives    Start     Dose/Rate Route Frequency Ordered Stop   02/16/15 1700  vancomycin (VANCOCIN) IVPB 750 mg/150 ml premix     750 mg 150 mL/hr over 60 Minutes Intravenous Every 48 hours 02/15/15 0930     02/15/15 1800  ceFEPIme (MAXIPIME) 1 g in dextrose 5 % 50 mL IVPB    Comments:  * Patient received 1 dose of Cefepime on 1/24 and has tolerated this medication *   1 g 100 mL/hr over 30 Minutes Intravenous Every 24 hours 02/15/15 0922     02/14/15 1530  vancomycin (VANCOCIN) IVPB 1000 mg/200 mL premix     1,000 mg 200 mL/hr over 60 Minutes Intravenous  Once 02/14/15 1526 02/14/15 1747   02/14/15 1530  ceFEPIme (MAXIPIME) 2 g in dextrose 5 % 50 mL IVPB     2 g 100 mL/hr over 30 Minutes Intravenous  Once 02/14/15 1526 02/14/15 1648     Assessment / Plan: Vancomycin 750mg  IV q48hrs Cefepime 1gm IV q24hrs Check Vancomycin trough level at steady state (target level 15-20) Monitor labs, renal fxn, progress and c/s PCN allergy noted, but appears patient tolerated  Rocephin in 2015 and tolerated Cefepime this admission.  Ena Dawley, [facility] 02/15/2015 10:37 AM

## 2015-02-15 NOTE — Progress Notes (Signed)
CSW left message for [name] at [facility] to assess patients' capacity prior to admission- per report, patient was admitted from [facility]-  Patient's sister spoke with CSW in the [facility] and is requesting an update- MD advised.    Eduard Clos, MSW, [place]

## 2015-02-15 NOTE — Progress Notes (Addendum)
ANTICOAGULATION CONSULT NOTE - Initial Consult  Pharmacy Consult for Heparin Indication: atrial fibrillation  Allergies  Allergen Reactions  . Morphine And Related Other (See Comments)    Dizziness and hallucinations  . Penicillins Anaphylaxis and Rash    Has patient had a PCN reaction causing immediate rash, facial/tongue/throat swelling, SOB or lightheadedness with hypotension:unknown Has patient had a PCN reaction causing severe rash involving mucus membranes or skin necrosis: unknown Has patient had a PCN reaction that required hospitalization unknown Has patient had a PCN reaction occurring within the last 10 years: unknown If all of the above answers are "NO", then may proceed with Cephalosporin use.     Patient Measurements: Height: 5\' 4"  (162.6 cm) Weight: 127 lb 13.9 oz (58 kg) IBW/kg (Calculated) : 54.7  Vital Signs: Temp: 98.6 F (37 C) (01/25 1000) BP: 76/45 mmHg (01/25 1000) Pulse Rate: 87 (01/25 1000)  Labs:  Recent Labs  02/14/15 1430 02/15/15 0328  HGB 10.4* 9.5*  HCT 30.9* 28.3*  PLT 153 173  CREATININE 7.97* 6.51*  CKTOTAL 350*  --   TROPONINI 0.03  --     Estimated Creatinine Clearance: 7.9 mL/min (by C-G formula based on Cr of 6.51).   Medical History: Past Medical History  Diagnosis Date  . Diverticulosis   . Pancreatitis 1980, 2015    in the setting of ETOH  . chronic systolic heart failure     a. ECHO (05/08/13): EF 20-25%, diff HK with akinesis of basal inferior wall, grade 1 DD, trivial MR, trivial TR b) RHC (05/06/13): RA 7, RV 30/2, PA 31/19 (24), PCWP 10, Fick CO/CI: 2.9/1.74, Thermo CO/Ci: 2.4/1.4, PA sat 53%  . Ischemic cardiomyopathy   . CAD (coronary artery disease)     a. LHC (04/2013): Chronically occluded LAD, moderate dz in large OM1 and severe dz and small posterior lateral vessel    . Persistent atrial fibrillation ([place]) 2015  . Suicidal overdose ([place]) 10/03/13    Overdoes on Beta Blockers & Xarelto [facility] ED)  . Anxiety  2015   . Psoriasis 1968  . GERD (gastroesophageal reflux disease) 2015   . Hyperlipidemia 2015  . Hypertension 2015  . ETOH abuse 1981    started abusing alcohol at age 72, quit   . Hypothyroidism   . PONV (postoperative nausea and vomiting)   . Mass of right breast on mammogram 07/14/2014  . AICD (automatic cardioverter/defibrillator) present   . Myocardial infarction ([place])     "I've had many" (09/13/2014)  . Anginal pain ([place])   . Anemia   . History of blood transfusion     "related to losing blood from somewhere; never found out from where"  . Stroke [facility]) 2011    left side weakness, minimal  . Depression   . 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/2014    Medications:  Prescriptions prior to admission  Medication Sig Dispense Refill Last Dose  . apixaban (ELIQUIS) 5 MG TABS tablet Take 1 tablet (5 mg total) by mouth 2 (two) times daily. 60 tablet 3 Past Week at Unknown time  . carvedilol (COREG) 12.5 MG tablet Take 1 tablet (12.5 mg total) by mouth 2 (two) times daily. 60 tablet 6 Past Week at Unknown time  . ezetimibe (ZETIA) 10 MG tablet Take 1 tablet (10 mg total) by mouth daily. 90 tablet 3 Past Week at Unknown time  . furosemide (LASIX) 20 MG tablet Take 1 tablet (20 mg total) by mouth every other day.  15 tablet 6 Past Week at Unknown time  . levothyroxine (SYNTHROID, LEVOTHROID) 25 MCG tablet Take 25 mcg by mouth daily before breakfast.    Past Week at Unknown time  . LORazepam (ATIVAN) 0.5 MG tablet Take 1 tablet (0.5 mg total) by mouth 2 (two) times daily. 60 tablet 2 Past Week at Unknown time  . pantoprazole (PROTONIX) 40 MG tablet TAKE 1 TABLET BY MOUTH TWICE DAILY BEFORE A MEAL. 60 tablet 5 Past Week at Unknown time  . rosuvastatin (CRESTOR) 40 MG tablet Take 1 tablet (40 mg total) by mouth daily. D/C order for Atorvastatin 30 tablet 3 Past Week at Unknown time  . sacubitril-valsartan (ENTRESTO) 24-26 MG Take 1 tablet by mouth 2 (two)  times daily. 60 tablet 5 Past Week at Unknown time  . sertraline (ZOLOFT) 50 MG tablet Take 100 mg by mouth daily.   Past Week at Unknown time  . spironolactone (ALDACTONE) 25 MG tablet Take 0.5 tablets (12.5 mg total) by mouth daily. 15 tablet 3 Past Week at Unknown time  . acetaminophen (TYLENOL) 325 MG tablet Take 650 mg by mouth every 6 (six) hours as needed for mild pain.   Unknown at Unknown time  . ammonium lactate (AMLACTIN) 12 % cream Apply 1 g topically daily as needed for dry skin (apply to elbows).    Unknown at Unknown time  . nitroGLYCERIN (NITROSTAT) 0.3 MG SL tablet Place 0.3 mg under the tongue every 5 (five) minutes as needed for chest pain (MAX 3 TABLETS). Reported on 01/18/2015   Unknown at Unknown time  . ondansetron (ZOFRAN) 4 MG tablet Take 4 mg by mouth every 8 (eight) hours as needed for nausea or vomiting.   Unknown at Unknown time  . zolpidem (AMBIEN) 10 MG tablet Take 1 tablet (10 mg total) by mouth at bedtime as needed for sleep. 30 tablet 0 Unknown at Unknown time   Assessment: 60yo female with h/o afib, pt was on Eliquis PTA.  Home meds listed above. Pt in ICU with ARF.  Asked to initiate Heparin for afib.  No bleeding reported, CBC trended down but OK.  D/W Dr Jerilee Hoh, will initiate IV Heparin.  Pt has not received any Eliquis last night or this morning.  Goal of Therapy:  Heparin level 0.3-0.7 units/ml Monitor platelets by anticoagulation protocol: Yes   Plan:  Heparin 4000 units IV now x 1 Heparin infusion at 950 units/hr Heparin level in 6-8 hrs then daily CBC daily while on Heparin.  [place] Crane, Aarion Metzgar A 02/15/2015,10:21 AM

## 2015-02-15 NOTE — Progress Notes (Signed)
ANTICOAGULATION CONSULT NOTE - Initial Consult  Pharmacy Consult for Heparin Indication: atrial fibrillation  Allergies  Allergen Reactions  . Morphine And Related Other (See Comments)    Dizziness and hallucinations  . Penicillins Anaphylaxis and Rash    Has patient had a PCN reaction causing immediate rash, facial/tongue/throat swelling, SOB or lightheadedness with hypotension:unknown Has patient had a PCN reaction causing severe rash involving mucus membranes or skin necrosis: unknown Has patient had a PCN reaction that required hospitalization unknown Has patient had a PCN reaction occurring within the last 10 years: unknown If all of the above answers are "NO", then may proceed with Cephalosporin use.     Patient Measurements: Height: 5\' 4"  (162.6 cm) Weight: 127 lb 13.9 oz (58 kg) IBW/kg (Calculated) : 54.7  Vital Signs: Temp: 98.4 F (36.9 C) (01/25 1830) BP: 111/57 mmHg (01/25 1830) Pulse Rate: 82 (01/25 1830)  Labs:  Recent Labs  02/14/15 1430 02/15/15 0328 02/15/15 0953 02/15/15 1850  HGB 10.4* 9.5*  --   --   HCT 30.9* 28.3*  --   --   PLT 153 173  --   --   LABPROT  --   --  25.7*  --   INR  --   --  2.38*  --   HEPARINUNFRC  --   --   --  >2.20*  CREATININE 7.97* 6.51* 6.42*  --   CKTOTAL 350*  --   --   --   TROPONINI 0.03  --   --   --     Estimated Creatinine Clearance: 8 mL/min (by C-G formula based on Cr of 6.42).   Medical History: Past Medical History  Diagnosis Date  . Diverticulosis   . Pancreatitis 1980, 2015    in the setting of ETOH  . chronic systolic heart failure     a. ECHO (05/08/13): EF 20-25%, diff HK with akinesis of basal inferior wall, grade 1 DD, trivial MR, trivial TR b) RHC (05/06/13): RA 7, RV 30/2, PA 31/19 (24), PCWP 10, Fick CO/CI: 2.9/1.74, Thermo CO/Ci: 2.4/1.4, PA sat 53%  . Ischemic cardiomyopathy   . CAD (coronary artery disease)     a. LHC (04/2013): Chronically occluded LAD, moderate dz in large OM1 and severe  dz and small posterior lateral vessel    . Persistent atrial fibrillation ([place]) 2015  . Suicidal overdose ([place]) 10/03/13    Overdoes on Beta Blockers & Xarelto [facility] ED)  . Anxiety 2015   . Psoriasis 1968  . GERD (gastroesophageal reflux disease) 2015   . Hyperlipidemia 2015  . Hypertension 2015  . ETOH abuse 1981    started abusing alcohol at age 76, quit   . Hypothyroidism   . PONV (postoperative nausea and vomiting)   . Mass of right breast on mammogram 07/14/2014  . AICD (automatic cardioverter/defibrillator) present   . Myocardial infarction ([place])     "I've had many" (09/13/2014)  . Anginal pain ([place])   . Anemia   . History of blood transfusion     "related to losing blood from somewhere; never found out from where"  . Stroke [facility]) 2011    left side weakness, minimal  . Depression   . 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/2014    Medications:  Prescriptions prior to admission  Medication Sig Dispense Refill Last Dose  . apixaban (ELIQUIS) 5 MG TABS tablet Take 1 tablet (5 mg total) by mouth 2 (two) times daily. 60 tablet 3  Past Week at Unknown time  . carvedilol (COREG) 12.5 MG tablet Take 1 tablet (12.5 mg total) by mouth 2 (two) times daily. 60 tablet 6 Past Week at Unknown time  . ezetimibe (ZETIA) 10 MG tablet Take 1 tablet (10 mg total) by mouth daily. 90 tablet 3 Past Week at Unknown time  . furosemide (LASIX) 20 MG tablet Take 1 tablet (20 mg total) by mouth every other day. 15 tablet 6 Past Week at Unknown time  . levothyroxine (SYNTHROID, LEVOTHROID) 25 MCG tablet Take 25 mcg by mouth daily before breakfast.    Past Week at Unknown time  . LORazepam (ATIVAN) 0.5 MG tablet Take 1 tablet (0.5 mg total) by mouth 2 (two) times daily. 60 tablet 2 Past Week at Unknown time  . pantoprazole (PROTONIX) 40 MG tablet TAKE 1 TABLET BY MOUTH TWICE DAILY BEFORE A MEAL. 60 tablet 5 Past Week at Unknown time  . rosuvastatin (CRESTOR) 40 MG tablet  Take 1 tablet (40 mg total) by mouth daily. D/C order for Atorvastatin 30 tablet 3 Past Week at Unknown time  . sacubitril-valsartan (ENTRESTO) 24-26 MG Take 1 tablet by mouth 2 (two) times daily. 60 tablet 5 Past Week at Unknown time  . sertraline (ZOLOFT) 50 MG tablet Take 100 mg by mouth daily.   Past Week at Unknown time  . spironolactone (ALDACTONE) 25 MG tablet Take 0.5 tablets (12.5 mg total) by mouth daily. 15 tablet 3 Past Week at Unknown time  . acetaminophen (TYLENOL) 325 MG tablet Take 650 mg by mouth every 6 (six) hours as needed for mild pain.   Unknown at Unknown time  . ammonium lactate (AMLACTIN) 12 % cream Apply 1 g topically daily as needed for dry skin (apply to elbows).    Unknown at Unknown time  . nitroGLYCERIN (NITROSTAT) 0.3 MG SL tablet Place 0.3 mg under the tongue every 5 (five) minutes as needed for chest pain (MAX 3 TABLETS). Reported on 01/18/2015   Unknown at Unknown time  . ondansetron (ZOFRAN) 4 MG tablet Take 4 mg by mouth every 8 (eight) hours as needed for nausea or vomiting.   Unknown at Unknown time  . zolpidem (AMBIEN) 10 MG tablet Take 1 tablet (10 mg total) by mouth at bedtime as needed for sleep. 30 tablet 0 Unknown at Unknown time   Assessment: 60yo female with h/o afib, pt was on Eliquis PTA.  Home meds listed above. Pt in ICU with ARF.  Asked to initiate Heparin for afib.  No bleeding reported, CBC trended down but OK.  D/W Dr Jerilee Hoh, will initiate IV Heparin.  Pt has not received any Eliquis last night or this morning. Heparin level >2.2 with is supratherapeutic.  Goal of Therapy:  Heparin level 0.3-0.7 units/ml Monitor platelets by anticoagulation protocol: Yes   Plan:  Hold heparin for 1 hour then resume heparin infusion at 700 units/hr Heparin level in 8 hrs then daily CBC daily while on Heparin. Monitor for S/S of bleeding  Isac Sarna, BS [place]ena [place], BCPS Clinical Pharmacist Pager [phone] 02/15/2015,7:51 PM

## 2015-02-15 NOTE — Clinical Documentation Improvement (Signed)
Internal Medicine  Can the diagnosis of Hypotension be further specified? Please document response in next progress note; NOT in BPA drop down box. Thanks!    Other  Clinically Undetermined  Supporting Information:  Being treated for severe sepsis with BP 64/30 with Map of 42, 84/40 Map of 46  Fluid resuscitation provided (6L); Neo-Synephrine initiated  Please exercise your independent, professional judgment when responding. A specific answer is not anticipated or expected.  Thank You, Zoila Shutter RN, BSN, [place] [phone]; Cell: [phone]

## 2015-02-15 NOTE — Consult Note (Signed)
[name] MRN: [id number] DOB/AGE: 1955/03/31 60 y.o. Primary Care Physician:FUNCHES, Lennox Laity, MD Admit date: 02/14/2015 Chief Complaint:  Chief Complaint  Patient presents with  . Altered Mental Status   HPI: Pt is 60 year old female with past medical hx of HTN who was brought to ER with c/o Altered mental status.   HPI dates back to past few days when pt has her teeth removed and pt was noticed to be lethargic at the group home. Pt was sent to emergency room  for further evauatiuon. Pt in Er was found to he hypothermic , hypotensive and probably in septic shock. Pt was started on IVF, antibiotics and admitted to ICU for further care.  Pt seen today in ICU. Pt unable to voice any concerns.    Past Medical History  Diagnosis Date  . Diverticulosis   . Pancreatitis 1980, 2015    in the setting of ETOH  . chronic systolic heart failure     a. ECHO (05/08/13): EF 20-25%, diff HK with akinesis of basal inferior wall, grade 1 DD, trivial MR, trivial TR b) RHC (05/06/13): RA 7, RV 30/2, PA 31/19 (24), PCWP 10, Fick CO/CI: 2.9/1.74, Thermo CO/Ci: 2.4/1.4, PA sat 53%  . Ischemic cardiomyopathy   . CAD (coronary artery disease)     a. LHC (04/2013): Chronically occluded LAD, moderate dz in large OM1 and severe dz and small posterior lateral vessel    . Persistent atrial fibrillation ([place]) 2015  . Suicidal overdose ([place]) 10/03/13    Overdoes on Beta Blockers & Xarelto [facility] ED)  . Anxiety 2015   . Psoriasis 1968  . GERD (gastroesophageal reflux disease) 2015   . Hyperlipidemia 2015  . Hypertension 2015  . ETOH abuse 1981    started abusing alcohol at age 77, quit   . Hypothyroidism   . PONV (postoperative nausea and vomiting)   . Mass of right breast on mammogram 07/14/2014  . AICD (automatic cardioverter/defibrillator) present   . Myocardial infarction ([place])     "I've had many" (09/13/2014)  . Anginal pain ([place])   . Anemia   . History of blood transfusion     "related to losing  blood from somewhere; never found out from where"  . Stroke [facility]) 2011    left side weakness, minimal  . Depression   . [name]/P ICD (internal cardiac defibrillator) procedure, St. Jude device 09/14/2014        Family History  Problem Relation Age of Onset  . CAD Father 81    MI  . CAD Mother 45    MI  . Alcohol abuse Mother   . Colon cancer Mother     in her 82s  . CAD Sister 42    Stent  . Diabetes Sister   . CAD Brother 20    MI  . Cancer Neg Hx     Social History:  reports that she has been smoking Cigarettes.  She has a 5.76 pack-year smoking history. She has never used smokeless tobacco. She reports that she does not drink alcohol or use illicit drugs.   Allergies:  Allergies  Allergen Reactions  . Morphine And Related Other (See Comments)    Dizziness and hallucinations  . Penicillins Anaphylaxis and Rash    Has patient had a PCN reaction causing immediate rash, facial/tongue/throat swelling, SOB or lightheadedness with hypotension:unknown Has patient had a PCN reaction causing severe rash involving mucus membranes or skin necrosis: unknown Has patient had a PCN reaction that  required hospitalization unknown Has patient had a PCN reaction occurring within the last 10 years: unknown If all of the above answers are "NO", then may proceed with Cephalosporin use.     Medications Prior to Admission  Medication Sig Dispense Refill  . apixaban (ELIQUIS) 5 MG TABS tablet Take 1 tablet (5 mg total) by mouth 2 (two) times daily. 60 tablet 3  . carvedilol (COREG) 12.5 MG tablet Take 1 tablet (12.5 mg total) by mouth 2 (two) times daily. 60 tablet 6  . ezetimibe (ZETIA) 10 MG tablet Take 1 tablet (10 mg total) by mouth daily. 90 tablet 3  . furosemide (LASIX) 20 MG tablet Take 1 tablet (20 mg total) by mouth every other day. 15 tablet 6  . levothyroxine (SYNTHROID, LEVOTHROID) 25 MCG tablet Take 25 mcg by mouth daily before breakfast.     . LORazepam (ATIVAN) 0.5 MG tablet  Take 1 tablet (0.5 mg total) by mouth 2 (two) times daily. 60 tablet 2  . pantoprazole (PROTONIX) 40 MG tablet TAKE 1 TABLET BY MOUTH TWICE DAILY BEFORE A MEAL. 60 tablet 5  . rosuvastatin (CRESTOR) 40 MG tablet Take 1 tablet (40 mg total) by mouth daily. D/C order for Atorvastatin 30 tablet 3  . sacubitril-valsartan (ENTRESTO) 24-26 MG Take 1 tablet by mouth 2 (two) times daily. 60 tablet 5  . sertraline (ZOLOFT) 50 MG tablet Take 100 mg by mouth daily.    Marland Kitchen spironolactone (ALDACTONE) 25 MG tablet Take 0.5 tablets (12.5 mg total) by mouth daily. 15 tablet 3  . acetaminophen (TYLENOL) 325 MG tablet Take 650 mg by mouth every 6 (six) hours as needed for mild pain.    Marland Kitchen ammonium lactate (AMLACTIN) 12 % cream Apply 1 g topically daily as needed for dry skin (apply to elbows).     . nitroGLYCERIN (NITROSTAT) 0.3 MG SL tablet Place 0.3 mg under the tongue every 5 (five) minutes as needed for chest pain (MAX 3 TABLETS). Reported on 01/18/2015    . ondansetron (ZOFRAN) 4 MG tablet Take 4 mg by mouth every 8 (eight) hours as needed for nausea or vomiting.    Marland Kitchen zolpidem (AMBIEN) 10 MG tablet Take 1 tablet (10 mg total) by mouth at bedtime as needed for sleep. 30 tablet 0       [id number] from the symptoms mentioned above,there are no other symptoms referable to all systems reviewed.  Marland Kitchen ceFEPime (MAXIPIME) IV  1 g Intravenous Q24H  . ezetimibe  10 mg Oral Daily  . levothyroxine  25 mcg Oral QAC breakfast  . pantoprazole  40 mg Oral Daily  . rosuvastatin  40 mg Oral q1800  . sertraline  100 mg Oral Daily  . [START ON 02/16/2015] vancomycin  750 mg Intravenous Q48H       Physical Exam: Vital signs in last 24 hours: Temp:  [96.6 F (35.9 C)-98.6 F (37 C)] 98.6 F (37 C) (01/25 0930) Pulse Rate:  [26-88] 85 (01/25 0930) Resp:  [10-23] 17 (01/25 0930) BP: (53-115)/(29-87) 102/71 mmHg (01/25 0930) SpO2:  [72 %-99 %] 88 % (01/25 0930) FiO2 (%):  [32 %] 32 % (01/25 0400) Weight:  [120 lb  (54.432 kg)-127 lb 13.9 oz (58 kg)] 127 lb 13.9 oz (58 kg) (01/25 0500) Weight change:     Intake/Output from previous day: 01/24 0701 - 01/25 0700 In: 2201.1 [I.V.:1174.4; IV Piggyback:1026.7] Out: 100 [Urine:100]     Physical Exam: General- pt is confused Resp- No acute REsp distress,decrease  bs  at bases CVS- S1S2 regular in rate and rhythm GIT- BS+, soft, NT, ND EXT- NO LE Edema, NO Cyanosis GU- Foley in situ     Lab Results: CBC  Recent Labs  02/14/15 1430 02/15/15 0328  WBC 20.9* 18.2*  HGB 10.4* 9.5*  HCT 30.9* 28.3*  PLT 153 173    BMET  Recent Labs  02/14/15 1430 02/15/15 0328  NA 137 136  K 5.5* 4.8  CL 101 110  CO2 18* 14*  GLUCOSE 108* 142*  BUN 65* 63*  CREATININE 7.97* 6.51*  CALCIUM 7.7* 5.8*    Creat trend 2017  7.97=>6.51 2016  1.1--1.3 2015   1.1--2.0( AKI in July)   Potassium trend 5.5=>4.8   MICRO Recent Results (from the past 240 hour([name]))  MRSA PCR Screening     Status: None   Collection Time: 02/14/15  2:08 PM  Result Value Ref Range Status   MRSA by PCR NEGATIVE NEGATIVE Final    Comment:        The GeneXpert MRSA Assay (FDA approved for NASAL specimens only), is one component of a comprehensive MRSA colonization surveillance program. It is not intended to diagnose MRSA infection nor to guide or monitor treatment for MRSA infections.   Urine culture     Status: None (Preliminary result)   Collection Time: 02/14/15  2:10 PM  Result Value Ref Range Status   Specimen Description URINE, CATHETERIZED  Final   Special Requests NONE  Final   Culture   Final    >=100,000 COLONIES/mL GRAM NEGATIVE RODS CULTURE REINCUBATED FOR BETTER GROWTH Performed at [facility]    Report Status PENDING  Incomplete      Lab Results  Component Value Date   CALCIUM 5.8* 02/15/2015   CAION 1.13 10/03/2013   PHOS 2.9 10/08/2013      Impression: 1)Renal  AKI secondary to Prerenal/ATN               AKi sec to  Hypovolemia                                 Meds- RAAS blockersas outpt                                 Sepsis                AKi beter                AKI on CKD                  CKD stage 3 .               CKD since 2015               CKD secondary to HTN/ CHF                Progression of CKD marked with AKI                Proteinura will check.  2)CVs- hemodynamically unstable     Pt requiring pressors support.  3)Anemia HGb stable   4)ID-admitted with sepsis    On IV abx      5) CHF- cardiology and  Primary MD following   Pt currently well compensated, rather hypovolemic.   Pt is not in fluid overload at this  time  6)Electrolytes  Hyperkalemic NOrmonatremic   7)Acid base Co2 NOt at goal  137-127=10 Alb 2.5 Delta AG 10-7=3  Delta bicarb   24-14=10  NON AG acidosis    Plan:  Will suggest to ask for ABG Will suggest add bicarb to IVF Will ask for fENA Will ask for spot prot/crea t ratio Will follow bmet please do check vanco tough Agree with IVF   [name] 02/15/2015, 9:50 AM

## 2015-02-15 NOTE — NC FL2 (Signed)
[place] LEVEL OF CARE SCREENING TOOL     IDENTIFICATION  Patient Name: [name] Birthdate: 02/19/1955 Sex: female Admission Date (Current Location): 02/14/2015  [facility] and [place] Number:  [employer] and Address:  [facility] [address], [place]      Provider Number: [id number]  Attending Physician Name and Address:  Koleen Nimrod Acost*  Relative Name and Phone Number:       Current Level of Care: Hospital Recommended Level of Care: [place] Prior Approval Number:    Date Approved/Denied:   PASRR Number:    Discharge Plan: Domiciliary (Rest home)    Current Diagnoses: Patient Active Problem List   Diagnosis Date Noted  . Chronic anticoagulation-Eliquis 02/15/2015  . Sepsis ([place]) 02/14/2015  . AKI (acute kidney injury) ([place]) 02/14/2015  . Acute renal failure (ARF) ([place]) 02/14/2015  . NSTEMI (non-ST elevated myocardial infarction) ([place]) 10/16/2014  . S/P ICD (internal cardiac defibrillator) procedure, St. 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 Ischemic cardiomyopathy 09/13/2014  . Mass of right breast on mammogram 07/14/2014  . Acute on chronic systolic CHF (congestive heart failure) ([place]) 04/09/2014  . CHF (congestive heart failure) ([place]) 04/09/2014  . CHF exacerbation ([place])   . Iron deficiency anemia 03/01/2014  . Gastritis and gastroduodenitis 02/28/2014  . Cervical cancer screening 12/30/2013  . Hypothyroidism 12/30/2013  . FH: colon cancer 11/18/2013  . Psoriasis 10/22/2013  . CKD (chronic kidney disease) stage 3, GFR 30-59 ml/min 10/22/2013  . Suicide attempt Sept 2015 10/03/2013  . Anxiety state 09/24/2013  . Cardiomyopathy, ischemic- EF 25-30% 2D 09/02/13 09/03/2013  . CAD- total LAD- residual OM2 disease 09/03/2013  . H/O PAF 06/06/2013  . Chronic systolic CHF (congestive heart failure) ([place]) 05/20/2013  . Tobacco abuse 05/09/2013  . Family history of premature CAD 05/09/2013  . H/O  alcohol abuse 05/06/2013  . Hypertension   . GERD (gastroesophageal reflux disease)     Orientation RESPIRATION BLADDER Height & Weight    Self  O2 (3L) Continent 5\' 4"  (162.6 cm) 127 lbs.  BEHAVIORAL SYMPTOMS/MOOD NEUROLOGICAL BOWEL NUTRITION STATUS      Continent Diet (LOW SODIUM)  AMBULATORY STATUS COMMUNICATION OF NEEDS Skin     Verbally Normal                       Personal Care Assistance Level of Assistance  Bathing, Dressing           Functional Limitations Info             SPECIAL CARE FACTORS FREQUENCY                       Contractures      Additional Factors Info  Code Status Code Status Info: FULL CODE             Current Medications (02/15/2015):  This is the current hospital active medication list Current Facility-Administered Medications  Medication Dose Route Frequency Provider Last Rate Last Dose  . ceFEPIme (MAXIPIME) 1 g in dextrose 5 % 50 mL IVPB  1 g Intravenous Q24H Erline Hau, MD      . ezetimibe (ZETIA) tablet 10 mg  10 mg Oral Daily Oswald Hillock, MD   10 mg at 02/14/15 2015  . heparin ADULT infusion 100 units/mL (25000 units/250 mL)  950 Units/hr Intravenous Continuous Erline Hau, MD 9.5 mL/hr at 02/15/15 1051 950  Units/hr at 02/15/15 1051  . levothyroxine (SYNTHROID, LEVOTHROID) tablet 25 mcg  25 mcg Oral QAC breakfast Oswald Hillock, MD   25 mcg at 02/15/15 0800  . norepinephrine (LEVOPHED) 16 mg in dextrose 5 % 250 mL (0.064 mg/mL) infusion  0-40 mcg/min Intravenous Titrated Oswald Hillock, MD 37.5 mL/hr at 02/15/15 0754 40 mcg/min at 02/15/15 0754  . ondansetron (ZOFRAN) tablet 4 mg  4 mg Oral Q6H PRN Oswald Hillock, MD       Or  . ondansetron (ZOFRAN) injection 4 mg  4 mg Intravenous Q6H PRN Oswald Hillock, MD      . pantoprazole (PROTONIX) EC tablet 40 mg  40 mg Oral Daily Oswald Hillock, MD   40 mg at 02/14/15 2200  . rosuvastatin (CRESTOR) tablet 40 mg  40 mg Oral q1800 Oswald Hillock, MD   40 mg at  02/14/15 2200  . sertraline (ZOLOFT) tablet 100 mg  100 mg Oral Daily Oswald Hillock, MD   100 mg at 02/15/15 1000  . sodium bicarbonate 150 mEq in sterile water 1,000 mL infusion   Intravenous Continuous Erline Hau, MD      . Derrill Memo ON 02/16/2015] vancomycin (VANCOCIN) IVPB 750 mg/150 ml premix  750 mg Intravenous Q48H Estela Leonie Green, MD      . zolpidem (AMBIEN) tablet 5 mg  5 mg Oral QHS PRN Oswald Hillock, MD         Discharge Medications: Please see discharge summary for a list of discharge medications.  Relevant Imaging Results:  Relevant Lab Results:   Additional Information [id number]  Ludwig Clarks, LCSW

## 2015-02-15 NOTE — Care Management Note (Signed)
Case Management Note  Patient Details  Name: [name] MRN: [id number] Date of Birth: [birth year]  Subjective/Objective:                  Pt admitted with sepsis. Pt is from [place]. Pt is alert and oriented at baseline. Pt is mostly ind at group home. Pt critically ill at this time. Unsure if pt will be appropriate to return to group home with [facility] services or if pt will need SNF. CSW following also.   Action/Plan: Will cont to follow for DC planning.   Expected Discharge Date:    02/21/2015              Expected Discharge Plan:  Assisted Living / Rest Home  In-House Referral:  Clinical Social Work  Discharge planning Services  CM Consult  Post Acute Care Choice:  NA Choice offered to:  NA  DME Arranged:    DME Agency:     HH Arranged:    HH Agency:     Status of Service:  In process, will continue to follow  Medicare Important Message Given:    Date Medicare IM Given:    Medicare IM give by:    Date Additional Medicare IM Given:    Additional Medicare Important Message give by:     If discussed at [place] of Stay Meetings, dates discussed:    Additional Comments:  [name], RN 02/15/2015, 1:12 PM

## 2015-02-15 NOTE — Progress Notes (Signed)
Pharmacy:  Antibiotic dosing and allergies: Pt has a documented allergy to Penicillin.  Pt received Cefepime 2gm (cephalosporin) yesterday (1/24) at ~ 1600hrs and has reportedly tolerated it w/o problem.    Has patient had a PCN reaction causing immediate rash, facial/tongue/throat swelling, SOB or lightheadedness with hypotension:unknown Has patient had a PCN reaction causing severe rash involving mucus membranes or skin necrosis: unknown Has patient had a PCN reaction that required hospitalization unknown Has patient had a PCN reaction occurring within the last 10 years: unknown If all of the above answers are "NO", then may proceed with Cephalosporin use.   Juel Burrow, PharmD Pharmacist, [facility]

## 2015-02-15 NOTE — Progress Notes (Addendum)
[facility] PROGRESS NOTE  Jemila Weir [id number] DOB: [birth year] DOA: 02/14/2015 PCP: [name], MD  Assessment/Plan: Severe sepsis with hypotension -She is currently on pressor support with Neo-Synephrine, max dose. -She still appears clinically dry. We'll give another bolus of normal saline mindful of her depressed ejection fraction. Per nursing staff she received 6 L of fluid yesterday, however she appears to be only 2 L positive according to documented intake and output. -The presumed source is urine. We'll continue broad-spectrum antibiotics today, consider narrowing in a.m. pending culture data.  Encephalopathy -Unsure of chronicity. Likely a component of acute encephalopathy given severe illness and sepsis. However she does reside in a group home, so I suspect she has some degree of mental dis-capacity. Will attempt to contact group home today for further information.  UTI -Continue vancomycin/cefepime pending results of culture data given severe sepsis with hypotension.  Acute renal failure -Suspect related to sepsis, hypotension as well as profound dehydration. -Creatinine has improved from 7.9-6.5 overnight. -Renal ultrasound without signs of hydronephrosis or obstruction. -Lasix, Aldactone, entresto have been appropriately held.  Paroxysmal atrial fibrillation -Currently rate controlled, eliquis remains on hold given degree of renal dysfunction. -We'll transition over to heparin, suspect renal function will continue to improve at which point we may resume novel oral anticoagulant.  History of ischemic cardiomyopathy  -2-D echo from March 2016 shows an ejection fraction of 25-30% with diffuse hypokinesis and grade 2 diastolic dysfunction. - status post AICD. -Will need to be careful with fluid resuscitation, although it is clear from exam today that she remains clinically dry.   Code Status: Full code Family Communication: Patient only    Disposition Plan: To be determined. Keep in ICU today   Consultants:  None   Antibiotics:  Vancomycin  Cefepime   Subjective: Lying crooked in bed. Makes eye contact and can answer simple questions. Very dry lips and tongue.  Objective: Filed Vitals:   02/15/15 0845 02/15/15 0900 02/15/15 0915 02/15/15 0930  BP: 103/69 94/78 86/40  102/71  Pulse: 81 84 83 85  Temp: 98.6 F (37 C) 98.6 F (37 C) 98.6 F (37 C) 98.6 F (37 C)  TempSrc:      Resp: 15 17 19 17   Height:      Weight:      SpO2: 88% 84% 89% 88%    Intake/Output Summary (Last 24 hours) at 02/15/15 1002 Last data filed at 02/15/15 0700  Gross per 24 hour  Intake 2201.05 ml  Output    100 ml  Net 2101.05 ml   Filed Weights   02/14/15 1408 02/14/15 1945 02/15/15 0500  Weight: 54.432 kg (120 lb) 58 kg (127 lb 13.9 oz) 58 kg (127 lb 13.9 oz)    Exam:   General:  Awake, oriented nonetheless appears confused  Cardiovascular: irregular  Respiratory: CTA B  Abdomen: S/NT/ND/+BS  Extremities: no C/C/E   Neurologic:  Moves all 4 spontaneously  Data Reviewed: Basic Metabolic Panel:  Recent Labs Lab 02/14/15 1430 02/15/15 0328  NA 137 136  K 5.5* 4.8  CL 101 110  CO2 18* 14*  GLUCOSE 108* 142*  BUN 65* 63*  CREATININE 7.97* 6.51*  CALCIUM 7.7* 5.8*   Liver Function Tests:  Recent Labs Lab 02/14/15 1430 02/15/15 0328  AST 22 22  ALT 14 14  ALKPHOS 88 75  BILITOT 0.6 0.5  PROT 7.2 5.4*  ALBUMIN 3.5 2.5*   No results for input(s): LIPASE, AMYLASE in the last 168 hours.  No results for input(s): AMMONIA in the last 168 hours. CBC:  Recent Labs Lab 02/14/15 1430 02/15/15 0328  WBC 20.9* 18.2*  HGB 10.4* 9.5*  HCT 30.9* 28.3*  MCV 89.8 90.7  PLT 153 173   Cardiac Enzymes:  Recent Labs Lab 02/14/15 1430  CKTOTAL 350*  TROPONINI 0.03   BNP (last 3 results)  Recent Labs  04/09/14 0038  BNP 1385.0*    ProBNP (last 3 results) No results for input(s): PROBNP in  the last 8760 hours.  CBG:  Recent Labs Lab 02/14/15 1438  GLUCAP 99    Recent Results (from the past 240 hour(s))  MRSA PCR Screening     Status: None   Collection Time: 02/14/15  2:08 PM  Result Value Ref Range Status   MRSA by PCR NEGATIVE NEGATIVE Final    Comment:        The GeneXpert MRSA Assay (FDA approved for NASAL specimens only), is one component of a comprehensive MRSA colonization surveillance program. It is not intended to diagnose MRSA infection nor to guide or monitor treatment for MRSA infections.   Urine culture     Status: None (Preliminary result)   Collection Time: 02/14/15  2:10 PM  Result Value Ref Range Status   Specimen Description URINE, CATHETERIZED  Final   Special Requests NONE  Final   Culture   Final    >=100,000 COLONIES/mL GRAM NEGATIVE RODS CULTURE REINCUBATED FOR BETTER GROWTH Performed at [facility]    Report Status PENDING  Incomplete     Studies: Ct Head Wo Contrast  02/14/2015  CLINICAL DATA:  Altered mental status and fatigue since having 3 teeth pulled 3 days ago. EXAM: CT HEAD WITHOUT CONTRAST TECHNIQUE: Contiguous axial images were obtained from the base of the skull through the vertex without intravenous contrast. COMPARISON:  10/02/2008 FINDINGS: Multiple small, chronic infarcts are again seen in the right MCA territory involving the frontal, parietal, and temporal lobes as well as basal ganglia. There is mild ex vacuo dilatation of the right lateral ventricle. There is no evidence of acute large territory infarct, intracranial hemorrhage, mass effect, or extra-axial fluid collection. Orbits are unremarkable. The visualized paranasal sinuses are clear. There are small, chronic bilateral mastoid effusions. Calcified atherosclerosis is noted at the skull base. IMPRESSION: 1. No evidence of acute intracranial abnormality. 2. Chronic right MCA territory infarcts. Electronically Signed   By: Logan Bores M.D.   On: 02/14/2015  16:57   US Renal  02/15/2015  CLINICAL DATA:  Acute kidney insufficiency EXAM: RENAL / URINARY TRACT ULTRASOUND COMPLETE COMPARISON:  Abdominal ultrasound 02/03/2015 FINDINGS: Right Kidney: Length: 10.2 cm . There is normal echogenicity. No hydronephrosis. No renal calculus. Trace perinephric fluid in midpole. Left Kidney: Length: 10.5 cm. Echogenicity within normal limits. No mass or hydronephrosis visualized. Bladder: Decompressed urinary bladder with Foley catheter. IMPRESSION: No hydronephrosis. No renal calculi. Trace perinephric fluid midpole of the right kidney. Unremarkable urinary bladder. Electronically Signed   By: Lahoma Crocker M.D.   On: 02/15/2015 09:49   Dg Chest Portable 1 View  02/14/2015  CLINICAL DATA:  Repositioned IJ line. EXAM: PORTABLE CHEST 1 VIEW COMPARISON:  02/14/2015 FINDINGS: Cardiac pacemaker. Right central venous catheter has been pulled back with tip now all over the low SVC region. No pneumothorax. Heart size and pulmonary vascularity are normal. No focal airspace disease or consolidation in the lungs. No blunting of costophrenic angles. IMPRESSION: Right central venous catheter has been pulled back with tip now over  the low SVC region. No evidence of active pulmonary disease. Electronically Signed   By: Lucienne Capers M.D.   On: 02/14/2015 19:06   Dg Chest Portable 1 View  02/14/2015  CLINICAL DATA:  Status post central line placement today. Initial encounter. EXAM: PORTABLE CHEST 1 VIEW COMPARISON:  Single view of the chest earlier today. FINDINGS: A new right IJ approach central venous catheter is in place with the tip projecting in the right atrium. Recommend withdrawal of 3-3.5 cm. There is no pneumothorax. The lungs are clear. Heart size is normal. No pleural effusion. IMPRESSION: Right IJ catheter tip projects in the right atrium. Recommend withdrawal of 3-3.5 cm. Negative for pneumothorax. Electronically Signed   By: Inge Rise M.D.   On: 02/14/2015 18:14   Dg  Chest Port 1 View  02/14/2015  CLINICAL DATA:  60 year old with acute onset of fatigue and mental status changes approximately 3 days ago related to having had several teeth pulled. Indwelling pacemaker. EXAM: PORTABLE CHEST 1 VIEW COMPARISON:  10/15/2014 and earlier. FINDINGS: Suboptimal inspiration accounts for crowded bronchovascular markings, especially in the bases, and accentuates the cardiac silhouette. Taking this into account, cardiac silhouette upper normal in size for AP portable technique. Lungs clear. Bronchovascular markings normal. Pulmonary vascularity normal. No visible pleural effusions. No pneumothorax. Left subclavian single lead transvenous pacemaker unchanged and appears intact. IMPRESSION: Suboptimal inspiration.  No acute cardiopulmonary disease. Electronically Signed   By: Evangeline Dakin M.D.   On: 02/14/2015 15:05    Scheduled Meds: . ceFEPime (MAXIPIME) IV  1 g Intravenous Q24H  . ezetimibe  10 mg Oral Daily  . levothyroxine  25 mcg Oral QAC breakfast  . pantoprazole  40 mg Oral Daily  . rosuvastatin  40 mg Oral q1800  . sertraline  100 mg Oral Daily  . [START ON 02/16/2015] vancomycin  750 mg Intravenous Q48H   Continuous Infusions: . sodium chloride 100 mL/hr at 02/15/15 0405  . norepinephrine (LEVOPHED) Adult infusion 40 mcg/min (02/15/15 0754)  .  sodium bicarbonate  infusion 1000 mL 100 mL/hr at 02/15/15 [id number]    Active Problems:   Hypertension   GERD (gastroesophageal reflux disease)   Chronic systolic CHF (congestive heart failure) (HCC)   H/O PAF   Cardiomyopathy, ischemic- EF 25-30% 2D 09/02/13   CAD- total LAD- residual OM2 disease   Suicide attempt Sept 2015   Hypothyroidism   S/P ICD (internal cardiac defibrillator) procedure, St. Jude device, 09/13/14   Sepsis ([place])   Acute renal failure (ARF) (HCC)   Chronic anticoagulation-Eliquis    Critical care time spent: 45 minutes. Greater than 50% of this time was spent in direct contact with the  patient coordinating care.    Lelon Frohlich  [facility] Pager [phone]  If 7PM-7AM, please contact night-coverage at [email or web address], password [facility] 02/15/2015, 10:02 AM  LOS: 1 day

## 2015-02-15 NOTE — Consult Note (Signed)
Reason for Consult:   Acute renal failure/ h/o CAD, CHF  Requesting Physician: [facility] Primary Cardiologist Dr Aundra Dubin  HPI:   60 y/o Caucasian female with a history of CAD with known total LAD, ICM, PAF on chronic anticoagulation, and previous mild renal insufficiency-SCr 1.17 Nov 2014. The pt has had cardiac cath on three occasions- April 2015, Aug 2015, and March 2016. Cath shows chronically occluded LAD with R-L collaterals and OM disease. Plan has been for medical Rx. She has knows severe ICM and is s/p St Jude ICD in Aug 2016. Other problems include PAF in 2015- Rx'd with Amiodarone (subsequently DC'd)  and anticoagulation (CHADs VASc=6 for sex, prior stroke, CHF, and vascular dis). She last saw Dr Aundra Dubin in Dec 2016 and at that time was doing well. She was on Aldactone and Entresto for CHF and Eliquis for anticoagulation.             She was admitted to [facility] 02/14/15 with altered mental status. Patient resides at a group home, 4 days ago she had multiple teeth removed and after that she was given pain medication. History on admission indicates she has been lethargic for 2 days prior to admission. The patient reportedly had very poor by mouth intake and did not eat or drink anything. In the ED she was found to be hypothermic and hypotensive. SCr on adm 7.97 with K+ of 5.5, WBC 20K, Lactic acid 1.39. History is obtained from admission H&P and ED notes, the pt is unable to give any history. Blood cultures pending, urine culture > 100k GM neg rods.   PMHx:  Past Medical History  Diagnosis Date  . Diverticulosis   . Pancreatitis 1980, 2015    in the setting of ETOH  . chronic systolic heart failure     a. ECHO (05/08/13): EF 20-25%, diff HK with akinesis of basal inferior wall, grade 1 DD, trivial MR, trivial TR b) RHC (05/06/13): RA 7, RV 30/2, PA 31/19 (24), PCWP 10, Fick CO/CI: 2.9/1.74, Thermo CO/Ci: 2.4/1.4, PA sat 53%  . Ischemic cardiomyopathy   . CAD (coronary artery  disease)     a. LHC (04/2013): Chronically occluded LAD, moderate dz in large OM1 and severe dz and small posterior lateral vessel    . Persistent atrial fibrillation ([place]) 2015  . Suicidal overdose ([place]) 10/03/13    Overdoes on Beta Blockers & Xarelto [facility] ED)  . Anxiety 2015   . Psoriasis 1968  . GERD (gastroesophageal reflux disease) 2015   . Hyperlipidemia 2015  . Hypertension 2015  . ETOH abuse 1981    started abusing alcohol at age 59, quit   . Hypothyroidism   . PONV (postoperative nausea and vomiting)   . Mass of right breast on mammogram 07/14/2014  . AICD (automatic cardioverter/defibrillator) present   . Myocardial infarction ([place])     "I've had many" (09/13/2014)  . Anginal pain ([place])   . Anemia   . History of blood transfusion     "related to losing blood from somewhere; never found out from where"  . Stroke [facility]) 2011    left side weakness, minimal  . Depression   . 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/2014    Past Surgical History  Procedure Laterality Date  . Colostomy takedown  2007  . Abdominal exploration surgery  2007  . Esophagogastroduodenoscopy (egd) with propofol N/A 11/22/2013    Dr. Oneida Alar: gastritis and duodenitis, negative  H.pylori  . Esophageal biopsy N/A 11/22/2013    Procedure: GASTRIC BIOPSIES;  Surgeon: Danie Binder, MD;  Location: [facility] ORS;  Service: Endoscopy;  Laterality: N/A;  . Left and right heart catheterization with coronary angiogram N/A 05/10/2013    Procedure: LEFT AND RIGHT HEART CATHETERIZATION WITH CORONARY ANGIOGRAM;  Surgeon: [name], MD;  Location: [facility] CATH LAB;  Service: Cardiovascular;  Laterality: N/A;  . Left heart catheterization with coronary angiogram N/A 09/03/2013    Procedure: LEFT HEART CATHETERIZATION WITH CORONARY ANGIOGRAM;  Surgeon: Sinclair Grooms, MD;  Location: [facility] CATH LAB;  Service: Cardiovascular;  Laterality: N/A;  . Colonoscopy with propofol N/A 03/29/2014    SLF: 1. No  definite source for anemia identified 2. 8 colon polyps removed 3. Severe diverticulosis noted the transverse colon  and right colon 4. Small internal hemorrohids.   . Polypectomy N/A 03/29/2014    Procedure: POLYPECTOMY;  Surgeon: Danie Binder, MD;  Location: [facility] ORS;  Service: Endoscopy;  Laterality: N/A;  ascending colon, sigmoid colon x4, rectal x3  . Left and right heart catheterization with coronary angiogram N/A 04/14/2014    Procedure: LEFT AND RIGHT HEART CATHETERIZATION WITH CORONARY ANGIOGRAM;  Surgeon: Larey Dresser, MD;  Location: [facility] CATH LAB;  Service: Cardiovascular;  Laterality: N/A;  . Agile capsule N/A 06/02/2014    Procedure: AGILE CAPSULE;  Surgeon: Danie Binder, MD;  Location: [facility] ENDO SUITE;  Service: Endoscopy;  Laterality: N/A;  0800  . Givens capsule study N/A 06/10/2014    Procedure: GIVENS CAPSULE STUDY;  Surgeon: Danie Binder, MD;  Location: [facility] ENDO SUITE;  Service: Endoscopy;  Laterality: N/A;  0700  . Ep implantable device N/A 09/13/2014    Procedure: ICD Implant;  Surgeon: [name], MD;  Location: [facility] CV LAB;  Service: Cardiovascular;  Laterality: N/A;  . Colostomy reversal  ?2009  . Ileostomy  ?2009  . Ileostomy closure  ?2010  . Colon surgery    . Cardiac catheterization    . Ep implantable device  09/13/14    ICD St Jude, placed     SOCHx:  reports that she has been smoking Cigarettes.  She has a 5.76 pack-year smoking history. She has never used smokeless tobacco. She reports that she does not drink alcohol or use illicit drugs.  FAMHx: Family History  Problem Relation Age of Onset  . CAD Father 39    MI  . CAD Mother 65    MI  . Alcohol abuse Mother   . Colon cancer Mother     in her 60s  . CAD Sister 21    Stent  . Diabetes Sister   . CAD Brother 22    MI  . Cancer Neg Hx     ALLERGIES: Allergies  Allergen Reactions  . Morphine And Related Other (See Comments)    Dizziness and hallucinations  . Penicillins Anaphylaxis and  Rash    Has patient had a PCN reaction causing immediate rash, facial/tongue/throat swelling, SOB or lightheadedness with hypotension:unknown Has patient had a PCN reaction causing severe rash involving mucus membranes or skin necrosis: unknown Has patient had a PCN reaction that required hospitalization unknown Has patient had a PCN reaction occurring within the last 10 years: unknown If all of the above answers are "NO", then may proceed with Cephalosporin use.     ROS: Review of Systems: Unobtainable secondary to pt's menta status  HOME MEDICATIONS: Prior to Admission medications   Medication Sig  Start Date End Date Taking? Authorizing Provider  apixaban (ELIQUIS) 5 MG TABS tablet Take 1 tablet (5 mg total) by mouth 2 (two) times daily. 12/30/13  Yes Amy D Clegg, NP  carvedilol (COREG) 12.5 MG tablet Take 1 tablet (12.5 mg total) by mouth 2 (two) times daily. 01/18/15  Yes Larey Dresser, MD  ezetimibe (ZETIA) 10 MG tablet Take 1 tablet (10 mg total) by mouth daily. 01/20/15  Yes Larey Dresser, MD  furosemide (LASIX) 20 MG tablet Take 1 tablet (20 mg total) by mouth every other day. 05/06/14  Yes Larey Dresser, MD  levothyroxine (SYNTHROID, LEVOTHROID) 25 MCG tablet Take 25 mcg by mouth daily before breakfast.    Yes Historical Provider, MD  LORazepam (ATIVAN) 0.5 MG tablet Take 1 tablet (0.5 mg total) by mouth 2 (two) times daily. 12/30/13  Yes Josalyn Funches, MD  pantoprazole (PROTONIX) 40 MG tablet TAKE 1 TABLET BY MOUTH TWICE DAILY BEFORE A MEAL. 02/25/14  Yes Carlis Stable, NP  rosuvastatin (CRESTOR) 40 MG tablet Take 1 tablet (40 mg total) by mouth daily. D/C order for Atorvastatin 11/22/14  Yes Larey Dresser, MD  sacubitril-valsartan (ENTRESTO) 24-26 MG Take 1 tablet by mouth 2 (two) times daily. 12/21/14  Yes Larey Dresser, MD  sertraline (ZOLOFT) 50 MG tablet Take 100 mg by mouth daily.   Yes Historical Provider, MD  spironolactone (ALDACTONE) 25 MG tablet Take 0.5 tablets  (12.5 mg total) by mouth daily. 05/06/14  Yes Larey Dresser, MD  acetaminophen (TYLENOL) 325 MG tablet Take 650 mg by mouth every 6 (six) hours as needed for mild pain.    Historical Provider, MD  ammonium lactate (AMLACTIN) 12 % cream Apply 1 g topically daily as needed for dry skin (apply to elbows).     Historical Provider, MD  nitroGLYCERIN (NITROSTAT) 0.3 MG SL tablet Place 0.3 mg under the tongue every 5 (five) minutes as needed for chest pain (MAX 3 TABLETS). Reported on 01/18/2015    Historical Provider, MD  ondansetron (ZOFRAN) 4 MG tablet Take 4 mg by mouth every 8 (eight) hours as needed for nausea or vomiting.    Historical Provider, MD  zolpidem (AMBIEN) 10 MG tablet Take 1 tablet (10 mg total) by mouth at bedtime as needed for sleep. 01/18/15 02/17/15  Larey Dresser, MD    HOSPITAL MEDICATIONS: I have reviewed the patient's current medications.  VITALS: Blood pressure 103/69, pulse 81, temperature 98.6 F (37 C), temperature source Oral, resp. rate 15, height 5\' 4"  (1.626 m), weight 127 lb 13.9 oz (58 kg), last menstrual period 01/22/1988, SpO2 88 %.  PHYSICAL EXAM: General appearance: mild distress, slowed mentation and awake but a little lethargic Neck: no carotid bruit and no JVD Lungs: clear to auscultation bilaterally Heart: regular rate and rhythm Abdomen: soft, non-tender; bowel sounds normal; no masses,  no organomegaly Extremities: no edema Pulses: diminnished Skin: warm, dry Neurologic: She is awake and responds to questions but only yes or no  LABS: Results for orders placed or performed during the hospital encounter of 02/14/15 (from the past 24 hour(s))  MRSA PCR Screening     Status: None   Collection Time: 02/14/15  2:08 PM  Result Value Ref Range   MRSA by PCR NEGATIVE NEGATIVE  Urinalysis, Routine w reflex microscopic (not at [facility])     Status: Abnormal   Collection Time: 02/14/15  2:10 PM  Result Value Ref Range   Color, Urine YELLOW YELLOW  APPearance HAZY (A) CLEAR   Specific Gravity, Urine >1.030 (H) 1.005 - 1.030   pH 5.0 5.0 - 8.0   Glucose, UA NEGATIVE NEGATIVE mg/dL   Hgb urine dipstick MODERATE (A) NEGATIVE   Bilirubin Urine SMALL (A) NEGATIVE   Ketones, ur TRACE (A) NEGATIVE mg/dL   Protein, ur 100 (A) NEGATIVE mg/dL   Nitrite NEGATIVE NEGATIVE   Leukocytes, UA SMALL (A) NEGATIVE  Urine rapid drug screen (hosp performed)     Status: Abnormal   Collection Time: 02/14/15  2:10 PM  Result Value Ref Range   Opiates POSITIVE (A) NONE DETECTED   Cocaine NONE DETECTED NONE DETECTED   Benzodiazepines POSITIVE (A) NONE DETECTED   Amphetamines NONE DETECTED NONE DETECTED   Tetrahydrocannabinol NONE DETECTED NONE DETECTED   Barbiturates NONE DETECTED NONE DETECTED  Urine culture     Status: None (Preliminary result)   Collection Time: 02/14/15  2:10 PM  Result Value Ref Range   Specimen Description URINE, CATHETERIZED    Special Requests NONE    Culture      >=100,000 COLONIES/mL GRAM NEGATIVE RODS CULTURE REINCUBATED FOR BETTER GROWTH Performed at [facility]    Report Status PENDING   Urine microscopic-add on     Status: Abnormal   Collection Time: 02/14/15  2:10 PM  Result Value Ref Range   Squamous Epithelial / LPF 6-30 (A) NONE SEEN   WBC, UA TOO NUMEROUS TO COUNT 0 - 5 WBC/hpf   RBC / HPF 6-30 0 - 5 RBC/hpf   Bacteria, UA MANY (A) NONE SEEN  Comprehensive metabolic panel     Status: Abnormal   Collection Time: 02/14/15  2:30 PM  Result Value Ref Range   Sodium 137 135 - 145 mmol/L   Potassium 5.5 (H) 3.5 - 5.1 mmol/L   Chloride 101 101 - 111 mmol/L   CO2 18 (L) 22 - 32 mmol/L   Glucose, Bld 108 (H) 65 - 99 mg/dL   BUN 65 (H) 6 - 20 mg/dL   Creatinine, Ser 7.97 (H) 0.44 - 1.00 mg/dL   Calcium 7.7 (L) 8.9 - 10.3 mg/dL   Total Protein 7.2 6.5 - 8.1 g/dL   Albumin 3.5 3.5 - 5.0 g/dL   AST 22 15 - 41 U/L   ALT 14 14 - 54 U/L   Alkaline Phosphatase 88 38 - 126 U/L   Total Bilirubin 0.6 0.3 - 1.2  mg/dL   GFR calc non Af Amer 5 (L) >60 mL/min   GFR calc Af Amer 6 (L) >60 mL/min   Anion gap 18 (H) 5 - 15  CBC     Status: Abnormal   Collection Time: 02/14/15  2:30 PM  Result Value Ref Range   WBC 20.9 (H) 4.0 - 10.5 K/uL   RBC 3.44 (L) 3.87 - 5.11 MIL/uL   Hemoglobin 10.4 (L) 12.0 - 15.0 g/dL   HCT 30.9 (L) 36.0 - 46.0 %   MCV 89.8 78.0 - 100.0 fL   MCH 30.2 26.0 - 34.0 pg   MCHC 33.7 30.0 - 36.0 g/dL   RDW 13.0 11.5 - 15.5 %   Platelets 153 150 - 400 K/uL  CK     Status: Abnormal   Collection Time: 02/14/15  2:30 PM  Result Value Ref Range   Total CK 350 (H) 38 - 234 U/L  Troponin I     Status: None   Collection Time: 02/14/15  2:30 PM  Result Value Ref Range   Troponin I  0.03 <0.031 ng/mL  CBG monitoring, ED     Status: None   Collection Time: 02/14/15  2:38 PM  Result Value Ref Range   Glucose-Capillary 99 65 - 99 mg/dL  I-Stat CG4 Lactic Acid, ED  (not at  [facility])     Status: None   Collection Time: 02/14/15  2:46 PM  Result Value Ref Range   Lactic Acid, Venous 1.39 0.5 - 2.0 mmol/L  CBC     Status: Abnormal   Collection Time: 02/15/15  3:28 AM  Result Value Ref Range   WBC 18.2 (H) 4.0 - 10.5 K/uL   RBC 3.12 (L) 3.87 - 5.11 MIL/uL   Hemoglobin 9.5 (L) 12.0 - 15.0 g/dL   HCT 28.3 (L) 36.0 - 46.0 %   MCV 90.7 78.0 - 100.0 fL   MCH 30.4 26.0 - 34.0 pg   MCHC 33.6 30.0 - 36.0 g/dL   RDW 13.3 11.5 - 15.5 %   Platelets 173 150 - 400 K/uL  Comprehensive metabolic panel     Status: Abnormal   Collection Time: 02/15/15  3:28 AM  Result Value Ref Range   Sodium 136 135 - 145 mmol/L   Potassium 4.8 3.5 - 5.1 mmol/L   Chloride 110 101 - 111 mmol/L   CO2 14 (L) 22 - 32 mmol/L   Glucose, Bld 142 (H) 65 - 99 mg/dL   BUN 63 (H) 6 - 20 mg/dL   Creatinine, Ser 6.51 (H) 0.44 - 1.00 mg/dL   Calcium 5.8 (LL) 8.9 - 10.3 mg/dL   Total Protein 5.4 (L) 6.5 - 8.1 g/dL   Albumin 2.5 (L) 3.5 - 5.0 g/dL   AST 22 15 - 41 U/L   ALT 14 14 - 54 U/L   Alkaline Phosphatase 75 38 -  126 U/L   Total Bilirubin 0.5 0.3 - 1.2 mg/dL   GFR calc non Af Amer 6 (L) >60 mL/min   GFR calc Af Amer 7 (L) >60 mL/min   Anion gap 12 5 - 15  Lactic acid, plasma     Status: None   Collection Time: 02/15/15  3:28 AM  Result Value Ref Range   Lactic Acid, Venous 0.5 0.5 - 2.0 mmol/L  Procalcitonin - Baseline     Status: None   Collection Time: 02/15/15  3:28 AM  Result Value Ref Range   Procalcitonin 4.24 ng/mL  Lactic acid, plasma     Status: Abnormal   Collection Time: 02/15/15  5:50 AM  Result Value Ref Range   Lactic Acid, Venous 0.4 (L) 0.5 - 2.0 mmol/L    EKG: NSR IVCD  IMAGING: Ct Head Wo Contrast  02/14/2015  CLINICAL DATA:  Altered mental status and fatigue since having 3 teeth pulled 3 days ago. EXAM: CT HEAD WITHOUT CONTRAST TECHNIQUE: Contiguous axial images were obtained from the base of the skull through the vertex without intravenous contrast. COMPARISON:  10/02/2008 FINDINGS: Multiple small, chronic infarcts are again seen in the right MCA territory involving the frontal, parietal, and temporal lobes as well as basal ganglia. There is mild ex vacuo dilatation of the right lateral ventricle. There is no evidence of acute large territory infarct, intracranial hemorrhage, mass effect, or extra-axial fluid collection. Orbits are unremarkable. The visualized paranasal sinuses are clear. There are small, chronic bilateral mastoid effusions. Calcified atherosclerosis is noted at the skull base. IMPRESSION: 1. No evidence of acute intracranial abnormality. 2. Chronic right MCA territory infarcts. Electronically Signed   By: Seymour Bars.D.  On: 02/14/2015 16:57   Dg Chest Portable 1 View  02/14/2015  CLINICAL DATA:  Repositioned IJ line. EXAM: PORTABLE CHEST 1 VIEW COMPARISON:  02/14/2015 FINDINGS: Cardiac pacemaker. Right central venous catheter has been pulled back with tip now all over the low SVC region. No pneumothorax. Heart size and pulmonary vascularity are normal. No  focal airspace disease or consolidation in the lungs. No blunting of costophrenic angles. IMPRESSION: Right central venous catheter has been pulled back with tip now over the low SVC region. No evidence of active pulmonary disease. Electronically Signed   By: Lucienne Capers M.D.   On: 02/14/2015 19:06   Dg Chest Portable 1 View  02/14/2015  CLINICAL DATA:  Status post central line placement today. Initial encounter. EXAM: PORTABLE CHEST 1 VIEW COMPARISON:  Single view of the chest earlier today. FINDINGS: A new right IJ approach central venous catheter is in place with the tip projecting in the right atrium. Recommend withdrawal of 3-3.5 cm. There is no pneumothorax. The lungs are clear. Heart size is normal. No pleural effusion. IMPRESSION: Right IJ catheter tip projects in the right atrium. Recommend withdrawal of 3-3.5 cm. Negative for pneumothorax. Electronically Signed   By: Inge Rise M.D.   On: 02/14/2015 18:14   Dg Chest Port 1 View  02/14/2015  CLINICAL DATA:  60 year old with acute onset of fatigue and mental status changes approximately 3 days ago related to having had several teeth pulled. Indwelling pacemaker. EXAM: PORTABLE CHEST 1 VIEW COMPARISON:  10/15/2014 and earlier. FINDINGS: Suboptimal inspiration accounts for crowded bronchovascular markings, especially in the bases, and accentuates the cardiac silhouette. Taking this into account, cardiac silhouette upper normal in size for [facility] portable technique. Lungs clear. Bronchovascular markings normal. Pulmonary vascularity normal. No visible pleural effusions. No pneumothorax. Left subclavian single lead transvenous pacemaker unchanged and appears intact. IMPRESSION: Suboptimal inspiration.  No acute cardiopulmonary disease. Electronically Signed   By: Evangeline Dakin M.D.   On: 02/14/2015 15:05    IMPRESSION: Active Problems:   Sepsis ([place])   Acute renal failure (ARF) (H[facility])   Chronic systolic CHF (congestive heart failure)  ([place])   H/O PAF   Cardiomyopathy, ischemic- EF 25-30% 2D 09/02/13   CAD- total LAD- residual OM2 disease   Chronic anticoagulation-Eliquis   Hypertension   GERD (gastroesophageal reflux disease)   Suicide attempt Sept 2015   Hypothyroidism   S/P ICD (internal cardiac defibrillator) procedure, St. Jude device, 09/13/14   RECOMMENDATION: Will review with MD. Remarkably pt's K+ was on 5.5 on admission with SCr of 7 and on Aldactone and Entresto.  Will check INR-(if normal it would suggest she was not on Eliquis), check echo, TSH, Mg++.  Hold Eliquis, Entresto, Lasix, and Aldactone. Consider Heparin in 48 hrs pending hospital course.  Watch for CHF as she is hydrated.   Time Spent Directly with Patient: [address] minutes  Kerin Ransom, [place] beeper 02/15/2015, 9:28 AM   Patient examined chart reviewed.  Patient admitted with sepsis and acute renal failure.  No obvious SBE or evidence of AICD infection.  Echo is pending Reviewed renal US just done at bedside and no hydronephrosis and normal kidney size suggesting acute failure with chance of recovery.  In setting of renal failure hold Eliquis check INR start heparin in 48 hrs Also hold Entresto and duiretics in setting of likely azotemia and sepsis with hypotension.  Will need to add back cardiac meds as hemodynamics improve.  Urine output only 100cc  Nephrology following  Jenkins Rouge

## 2015-02-16 LAB — CBC
HCT: 25.3 % — ABNORMAL LOW (ref 36.0–46.0)
Hemoglobin: 8.7 g/dL — ABNORMAL LOW (ref 12.0–15.0)
MCH: 30 pg (ref 26.0–34.0)
MCHC: 34.4 g/dL (ref 30.0–36.0)
MCV: 87.2 fL (ref 78.0–100.0)
PLATELETS: 172 10*3/uL (ref 150–400)
RBC: 2.9 MIL/uL — ABNORMAL LOW (ref 3.87–5.11)
RDW: 13 % (ref 11.5–15.5)
WBC: 11 10*3/uL — AB (ref 4.0–10.5)

## 2015-02-16 LAB — HEPARIN LEVEL (UNFRACTIONATED)
Heparin Unfractionated: >2.2 IU/mL — ABNORMAL HIGH (ref 0.30–0.70)
Heparin Unfractionated: >2.2 IU/mL — ABNORMAL HIGH (ref 0.30–0.70)

## 2015-02-16 LAB — APTT: aPTT: 163 seconds — ABNORMAL HIGH (ref 24–37)

## 2015-02-16 LAB — BASIC METABOLIC PANEL
Anion gap: 15 (ref 5–15)
BUN: 63 mg/dL — AB (ref 6–20)
CHLORIDE: 107 mmol/L (ref 101–111)
CO2: 18 mmol/L — AB (ref 22–32)
CREATININE: 6.39 mg/dL — AB (ref 0.44–1.00)
Calcium: 5.9 mg/dL — CL (ref 8.9–10.3)
GFR calc Af Amer: 7 mL/min — ABNORMAL LOW (ref >60)
GFR calc non Af Amer: 6 mL/min — ABNORMAL LOW (ref >60)
Glucose, Bld: 78 mg/dL (ref 65–99)
Potassium: 3.9 mmol/L (ref 3.5–5.1)
SODIUM: 140 mmol/L (ref 135–145)

## 2015-02-16 MED ORDER — CALCIUM CARBONATE 1250 MG/5ML PO SUSP
1000.0000 mg | Freq: Three times a day (TID) | ORAL | Status: DC
  Administered 2015-02-16 – 2015-02-20 (×11): 1000 mg via ORAL
  Administered 2015-02-20 – 2015-02-21 (×3): 200 mg via ORAL
  Administered 2015-02-22 (×4): 1000 mg via ORAL
  Filled 2015-02-16 (×26): qty 10

## 2015-02-16 MED ORDER — HEPARIN (PORCINE) IN NACL 100-0.45 UNIT/ML-% IJ SOLN: 500.0000 [IU]/h | INTRAMUSCULAR | Status: DC

## 2015-02-16 MED ORDER — ACETAMINOPHEN 325 MG PO TABS
650.0000 mg | ORAL_TABLET | Freq: Four times a day (QID) | ORAL | Status: DC | PRN
  Administered 2015-02-16 – 2015-02-22 (×8): 650 mg via ORAL
  Filled 2015-02-16 (×9): qty 2

## 2015-02-16 MED ORDER — HEPARIN (PORCINE) IN NACL 100-0.45 UNIT/ML-% IJ SOLN
450.0000 [IU]/h | INTRAMUSCULAR | Status: DC
  Administered 2015-02-16: 400 [IU]/h via INTRAVENOUS
  Administered 2015-02-19: 450 [IU]/h via INTRAVENOUS
  Filled 2015-02-16 (×2): qty 250

## 2015-02-16 NOTE — Progress Notes (Signed)
Subjective: Patient complains of some nausea but no vomiting. She also say yes when asked her whether she has any chest pain. Some difficulty breathing but not worse than before.   Objective: Vital signs in last 24 hours: Temp:  [97.2 F (36.2 C)-98.8 F (37.1 C)] 97.7 F (36.5 C) (01/26 0730) Pulse Rate:  [72-91] 79 (01/26 0730) Resp:  [12-23] 14 (01/26 0730) BP: (64-150)/(30-95) 116/62 mmHg (01/26 0715) SpO2:  [80 %-100 %] 98 % (01/26 0730) Weight:  [141 lb 15.6 oz (64.4 kg)] 141 lb 15.6 oz (64.4 kg) (01/26 0500)  Intake/Output from previous day: 01/25 0701 - 01/26 0700 In: 50 [IV Piggyback:50] Out: 1175 [Urine:1175] Intake/Output this shift:     Recent Labs  02/14/15 1430 02/15/15 0328 02/16/15 0440  HGB 10.4* 9.5* 8.7*    Recent Labs  02/15/15 0328 02/16/15 0440  WBC 18.2* 11.0*  RBC 3.12* 2.90*  HCT 28.3* 25.3*  PLT 173 172    Recent Labs  02/15/15 0953 02/16/15 0440  NA 137 140  K 4.8 3.9  CL 113* 107  CO2 14* 18*  BUN 62* 63*  CREATININE 6.42* 6.39*  GLUCOSE 126* 78  CALCIUM 6.1* 5.9*    Recent Labs  02/15/15 0953  INR 2.38*    Generally patient is alert and in no apparent distress Chest: She has some inspiratory rhonchi Heart exam regular rate and rhythm no murmur Abdomen: Positive bowel sounds Extremities no edema  Assessment/Plan: Problem #1 acute kidney injury superimposed on chronic. Etiology was thought to be secondary to combination of ATN/prerenal/RAAS. Presently patient is nonoliguric and her renal function seems to be improving. Problem #2 metabolic acidosis: CO2 is 18 and improving. Patient is on sodium bicarbonate supplement with her IV fluid. Problem #3 history of ischemic cardiomyopathy with low ejection fraction. She is also status post AICD placement. Presently patient doesn't have any sign of fluid overload and she had about 1100 mL of urine output the last 24 hours. Problem #4 anemia: Her hemoglobin is declining Problem  #5 history of hypertension: Her blood pressure is presently was in acceptable range however she has episode of hypotension. Problem #6 history of chronic renal failure. Patient creatinine was 1.26 with a GFR of 46 mL/m about 4 weeks ago on 01/18/2015. That's his stage III chronic renal failure. Ultrasound of the kidneys showed normal kidney and there is no hydronephrosis. Problem #7 hypocalcemia: Her calcium is 5.9. Corrected calcium for albumin of 2.5 is 7.1. Presently phosphorus is not available. Plan: 1] We'll increase her IV fluid to 1 25 mL per hour 2] we'll check compressive metabolic panel and phosphorus in the morning 3] will start patient on calcium carbonate 1000 mg suspension by mouth 3 times a day   Ustin Cruickshank S 02/16/2015, 8:16 AM

## 2015-02-16 NOTE — Progress Notes (Signed)
[place] for Heparin Indication: atrial fibrillation  Allergies  Allergen Reactions  . Morphine And Related Other (See Comments)    Dizziness and hallucinations  . Penicillins Anaphylaxis and Rash    Has patient had a PCN reaction causing immediate rash, facial/tongue/throat swelling, SOB or lightheadedness with hypotension:unknown Has patient had a PCN reaction causing severe rash involving mucus membranes or skin necrosis: unknown Has patient had a PCN reaction that required hospitalization unknown Has patient had a PCN reaction occurring within the last 10 years: unknown If all of the above answers are "NO", then may proceed with Cephalosporin use.     Patient Measurements: Height: 5\' 4"  (162.6 cm) Weight: 141 lb 15.6 oz (64.4 kg) IBW/kg (Calculated) : 54.7  Vital Signs: Temp: 97.5 F (36.4 C) (01/26 1130) BP: 129/64 mmHg (01/26 1130) Pulse Rate: 84 (01/26 1130)  Labs:  Recent Labs  02/14/15 1430 02/15/15 0328 02/15/15 0953 02/15/15 1850 02/16/15 0409 02/16/15 0440 02/16/15 0445  HGB 10.4* 9.5*  --   --   --  8.7*  --   HCT 30.9* 28.3*  --   --   --  25.3*  --   PLT 153 173  --   --   --  172  --   APTT  --   --   --   --  >200*  --   --   LABPROT  --   --  25.7*  --   --   --   --   INR  --   --  2.38*  --   --   --   --   HEPARINUNFRC  --   --   --  >2.20*  --   --  >2.20*  CREATININE 7.97* 6.51* 6.42*  --   --  6.39*  --   CKTOTAL 350*  --   --   --   --   --   --   TROPONINI 0.03  --   --   --   --   --   --     Estimated Creatinine Clearance: 8.1 mL/min (by C-G formula based on Cr of 6.39).   Medical History: Past Medical History  Diagnosis Date  . Diverticulosis   . Pancreatitis 1980, 2015    in the setting of ETOH  . chronic systolic heart failure     a. ECHO (05/08/13): EF 20-25%, diff HK with akinesis of basal inferior wall, grade 1 DD, trivial MR, trivial TR b) RHC (05/06/13): RA 7, RV 30/2, PA 31/19 (24),  PCWP 10, Fick CO/CI: 2.9/1.74, Thermo CO/Ci: 2.4/1.4, PA sat 53%  . Ischemic cardiomyopathy   . CAD (coronary artery disease)     a. LHC (04/2013): Chronically occluded LAD, moderate dz in large OM1 and severe dz and small posterior lateral vessel    . Persistent atrial fibrillation ([place]) 2015  . Suicidal overdose ([place]) 10/03/13    Overdoes on Beta Blockers & Xarelto [facility] ED)  . Anxiety 2015   . Psoriasis 1968  . GERD (gastroesophageal reflux disease) 2015   . Hyperlipidemia 2015  . Hypertension 2015  . ETOH abuse 1981    started abusing alcohol at age 62, quit   . Hypothyroidism   . PONV (postoperative nausea and vomiting)   . Mass of right breast on mammogram 07/14/2014  . AICD (automatic cardioverter/defibrillator) present   . Myocardial infarction ([place])     "I've had many" (09/13/2014)  .  Anginal pain ([place])   . Anemia   . History of blood transfusion     "related to losing blood from somewhere; never found out from where"  . Stroke [facility]) 2011    left side weakness, minimal  . Depression   . 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/2014    Medications:  Prescriptions prior to admission  Medication Sig Dispense Refill Last Dose  . apixaban (ELIQUIS) 5 MG TABS tablet Take 1 tablet (5 mg total) by mouth 2 (two) times daily. 60 tablet 3 Past Week at Unknown time  . carvedilol (COREG) 12.5 MG tablet Take 1 tablet (12.5 mg total) by mouth 2 (two) times daily. 60 tablet 6 Past Week at Unknown time  . ezetimibe (ZETIA) 10 MG tablet Take 1 tablet (10 mg total) by mouth daily. 90 tablet 3 Past Week at Unknown time  . furosemide (LASIX) 20 MG tablet Take 1 tablet (20 mg total) by mouth every other day. 15 tablet 6 Past Week at Unknown time  . levothyroxine (SYNTHROID, LEVOTHROID) 25 MCG tablet Take 25 mcg by mouth daily before breakfast.    Past Week at Unknown time  . LORazepam (ATIVAN) 0.5 MG tablet Take 1 tablet (0.5 mg total) by mouth 2 (two) times  daily. 60 tablet 2 Past Week at Unknown time  . pantoprazole (PROTONIX) 40 MG tablet TAKE 1 TABLET BY MOUTH TWICE DAILY BEFORE A MEAL. 60 tablet 5 Past Week at Unknown time  . rosuvastatin (CRESTOR) 40 MG tablet Take 1 tablet (40 mg total) by mouth daily. D/C order for Atorvastatin 30 tablet 3 Past Week at Unknown time  . sacubitril-valsartan (ENTRESTO) 24-26 MG Take 1 tablet by mouth 2 (two) times daily. 60 tablet 5 Past Week at Unknown time  . sertraline (ZOLOFT) 50 MG tablet Take 100 mg by mouth daily.   Past Week at Unknown time  . spironolactone (ALDACTONE) 25 MG tablet Take 0.5 tablets (12.5 mg total) by mouth daily. 15 tablet 3 Past Week at Unknown time  . acetaminophen (TYLENOL) 325 MG tablet Take 650 mg by mouth every 6 (six) hours as needed for mild pain.   Unknown at Unknown time  . ammonium lactate (AMLACTIN) 12 % cream Apply 1 g topically daily as needed for dry skin (apply to elbows).    Unknown at Unknown time  . nitroGLYCERIN (NITROSTAT) 0.3 MG SL tablet Place 0.3 mg under the tongue every 5 (five) minutes as needed for chest pain (MAX 3 TABLETS). Reported on 01/18/2015   Unknown at Unknown time  . ondansetron (ZOFRAN) 4 MG tablet Take 4 mg by mouth every 8 (eight) hours as needed for nausea or vomiting.   Unknown at Unknown time  . zolpidem (AMBIEN) 10 MG tablet Take 1 tablet (10 mg total) by mouth at bedtime as needed for sleep. 30 tablet 0 Unknown at Unknown time   Assessment: 60yo female with h/o afib, pt was on Eliquis PTA.  Started heparin yesterday.  AM HL >2.2, aPTT >200 sec.  Hg is down to 8.7.  No bleeding reported Admitted with ARF.  Not sure if eliquis is still affecting anti-Xa levels so will also monitor with aPTT.  APTT also elevated this am.  Goal of Therapy:  APTT 66-102 Heparin level 0.3-0.7 units/ml Monitor platelets by anticoagulation protocol: Yes   Plan:  Hold heparin for 1 hour then resume heparin infusion at 500 units/hr Heparin level and aPTT in 6  hrs Thanks for allowing pharmacy  to be a part of this patient's care.  Excell Seltzer, PharmD Clinical Pharmacist 02/16/2015,12:08 PM

## 2015-02-16 NOTE — Progress Notes (Signed)
Consulting cardiologist: Dorris Carnes MD Primary Cardiologist: [name] MD  Cardiology Specific Problem List: 1.CAD 2. ICM s/p ICD (St. Jude). 3. PAF 4. CHF  Subjective:    Pt denies CP  Breathing is OK    Objective:   Temp:  [97.2 F (36.2 C)-98.8 F (37.1 C)] 97.7 F (36.5 C) (01/26 0730) Pulse Rate:  [46-91] 79 (01/26 0730) Resp:  [12-23] 14 (01/26 0730) BP: (64-150)/(30-95) 116/62 mmHg (01/26 0715) SpO2:  [80 %-100 %] 98 % (01/26 0730) Weight:  [141 lb 15.6 oz (64.4 kg)] 141 lb 15.6 oz (64.4 kg) (01/26 0500)    Filed Weights   02/14/15 1945 02/15/15 0500 02/16/15 0500  Weight: 127 lb 13.9 oz (58 kg) 127 lb 13.9 oz (58 kg) 141 lb 15.6 oz (64.4 kg)    Intake/Output Summary (Last 24 hours) at 02/16/15 0758 Last data filed at 02/16/15 0500  Gross per 24 hour  Intake     50 ml  Output   1175 ml  Net  -1125 ml    Telemetry: Sinus rhythm rate in the 90's.   Exam:  General: Pt awake  Denies CP    HEENT: Conjunctiva and lids normal, oropharynx clear. Mouth and lips very dry.   Neck  JVP is increased    Lungs: Mild rhonchi    Cardiac: . RRR, No murmurs no gallop or rub.   Abdomen: Normoactive bowel sounds, Mild diffuse tenderness    Extremities: No pitting edema, distal pulses full.    Echocardiogram 02/15/2015 Left ventricle: Diffuse hypokinesis worse in the inferior wall and septum paradoxic motion ? from pacing The cavity size was moderately dilated. Wall thickness was normal. Systolic function was severely reduced. The estimated ejection fraction was in the range of 25% to 30%. - Atrial septum: No defect or patent foramen ovale was identified. - Pericardium, extracardiac: A trivial pericardial effusion was identified. - Impressions: No evidence of SBE or vegetation  Lab Results:  Basic Metabolic Panel:  Recent Labs Lab 02/15/15 0328 02/15/15 0953 02/16/15 0440  NA 136 137 140  K 4.8 4.8 3.9  CL 110 113* 107  CO2 14*  14* 18*  GLUCOSE 142* 126* 78  BUN 63* 62* 63*  CREATININE 6.51* 6.42* 6.39*  CALCIUM 5.8* 6.1* 5.9*  MG  --  1.6*  --     Liver Function Tests:  Recent Labs Lab 02/14/15 1430 02/15/15 0328 02/15/15 0953  AST 22 22 26   ALT 14 14 15   ALKPHOS 88 75 84  BILITOT 0.6 0.5 0.5  PROT 7.2 5.4* 5.4*  ALBUMIN 3.5 2.5* 2.5*    CBC:  Recent Labs Lab 02/14/15 1430 02/15/15 0328 02/16/15 0440  WBC 20.9* 18.2* 11.0*  HGB 10.4* 9.5* 8.7*  HCT 30.9* 28.3* 25.3*  MCV 89.8 90.7 87.2  PLT 153 173 172    Cardiac Enzymes:  Recent Labs Lab 02/14/15 1430  CKTOTAL 350*  TROPONINI 0.03    Coagulation:  Recent Labs Lab 02/15/15 0953  INR 2.38*    Radiology: Ct Head Wo Contrast  02/14/2015  CLINICAL DATA:  Altered mental status and fatigue since having 3 teeth pulled 3 days ago. EXAM: CT HEAD WITHOUT CONTRAST TECHNIQUE: Contiguous axial images were obtained from the base of the skull through the vertex without intravenous contrast. COMPARISON:  10/02/2008 FINDINGS: Multiple small, chronic infarcts are again seen in the right MCA territory involving the frontal, parietal, and temporal lobes as well as basal ganglia. There is mild ex vacuo dilatation  of the right lateral ventricle. There is no evidence of acute large territory infarct, intracranial hemorrhage, mass effect, or extra-axial fluid collection. Orbits are unremarkable. The visualized paranasal sinuses are clear. There are small, chronic bilateral mastoid effusions. Calcified atherosclerosis is noted at the skull base. IMPRESSION: 1. No evidence of acute intracranial abnormality. 2. Chronic right MCA territory infarcts. Electronically Signed   By: Logan Bores M.D.   On: 02/14/2015 16:57   US Renal  02/15/2015  CLINICAL DATA:  Acute kidney insufficiency EXAM: RENAL / URINARY TRACT ULTRASOUND COMPLETE COMPARISON:  Abdominal ultrasound 02/03/2015 FINDINGS: Right Kidney: Length: 10.2 cm . There is normal echogenicity. No  hydronephrosis. No renal calculus. Trace perinephric fluid in midpole. Left Kidney: Length: 10.5 cm. Echogenicity within normal limits. No mass or hydronephrosis visualized. Bladder: Decompressed urinary bladder with Foley catheter. IMPRESSION: No hydronephrosis. No renal calculi. Trace perinephric fluid midpole of the right kidney. Unremarkable urinary bladder. Electronically Signed   By: Lahoma Crocker M.D.   On: 02/15/2015 09:49   Dg Chest Portable 1 View  02/14/2015  CLINICAL DATA:  Repositioned IJ line. EXAM: PORTABLE CHEST 1 VIEW COMPARISON:  02/14/2015 FINDINGS: Cardiac pacemaker. Right central venous catheter has been pulled back with tip now all over the low SVC region. No pneumothorax. Heart size and pulmonary vascularity are normal. No focal airspace disease or consolidation in the lungs. No blunting of costophrenic angles. IMPRESSION: Right central venous catheter has been pulled back with tip now over the low SVC region. No evidence of active pulmonary disease. Electronically Signed   By: Lucienne Capers M.D.   On: 02/14/2015 19:06   Dg Chest Portable 1 View  02/14/2015  CLINICAL DATA:  Status post central line placement today. Initial encounter. EXAM: PORTABLE CHEST 1 VIEW COMPARISON:  Single view of the chest earlier today. FINDINGS: A new right IJ approach central venous catheter is in place with the tip projecting in the right atrium. Recommend withdrawal of 3-3.5 cm. There is no pneumothorax. The lungs are clear. Heart size is normal. No pleural effusion. IMPRESSION: Right IJ catheter tip projects in the right atrium. Recommend withdrawal of 3-3.5 cm. Negative for pneumothorax. Electronically Signed   By: Inge Rise M.D.   On: 02/14/2015 18:14   Dg Chest Port 1 View  02/14/2015  CLINICAL DATA:  60 year old with acute onset of fatigue and mental status changes approximately 3 days ago related to having had several teeth pulled. Indwelling pacemaker. EXAM: PORTABLE CHEST 1 VIEW  COMPARISON:  10/15/2014 and earlier. FINDINGS: Suboptimal inspiration accounts for crowded bronchovascular markings, especially in the bases, and accentuates the cardiac silhouette. Taking this into account, cardiac silhouette upper normal in size for AP portable technique. Lungs clear. Bronchovascular markings normal. Pulmonary vascularity normal. No visible pleural effusions. No pneumothorax. Left subclavian single lead transvenous pacemaker unchanged and appears intact. IMPRESSION: Suboptimal inspiration.  No acute cardiopulmonary disease. Electronically Signed   By: Evangeline Dakin M.D.   On: 02/14/2015 15:05     Medications:   Scheduled Medications: . ceFEPime (MAXIPIME) IV  1 g Intravenous Q24H  . ezetimibe  10 mg Oral Daily  . levothyroxine  25 mcg Oral QAC breakfast  . pantoprazole  40 mg Oral Daily  . rosuvastatin  40 mg Oral q1800  . sertraline  100 mg Oral Daily  . vancomycin  750 mg Intravenous Q48H    Infusions: . heparin 700 Units/hr (02/15/15 2100)  . norepinephrine (LEVOPHED) Adult infusion 10 mcg/min (02/15/15 1833)  .  sodium bicarbonate  150 mEq in sterile water 1000 mL infusion 100 mL/hr at 02/15/15 2237    PRN Medications: ondansetron **OR** ondansetron (ZOFRAN) IV, zolpidem   Assessment and Plan:   1. Acute on Chronic Systolic CHF in the setting of ICM:   Echo demonstrates continues severe LV dysfunction with EF of 25%-30%.  JVP is mildly increased  but lungs clear  No edema  Pt now off of levophed  BP is OK   I would follow today before adding back  (just finished)\   2. PAF: No rate control medications or anticoagulation currently in the setting of anemia. Hgb now 8.7 from 9.5. WBC improving from 18.2 to 11.0. INR 2.38 yestrday  Check today    3  Renal  Renal service following  Watch U.O    4.  Sepsis  COntinue ABX  Improving .       Continue to monitor for improvement before re instituting cardiac medications.     Phill Myron. Lawrence NP  [place]  02/16/2015, 7:58 AM   Pt seen and examined  I have amended note (PE, plan) above by Arnold Long to reflct my findings  Will continue to follow.  Dorris Carnes

## 2015-02-16 NOTE — Progress Notes (Signed)
[place] for Heparin Indication: atrial fibrillation  Allergies  Allergen Reactions  . Morphine And Related Other (See Comments)    Dizziness and hallucinations  . Penicillins Anaphylaxis and Rash    Has patient had a PCN reaction causing immediate rash, facial/tongue/throat swelling, SOB or lightheadedness with hypotension:unknown Has patient had a PCN reaction causing severe rash involving mucus membranes or skin necrosis: unknown Has patient had a PCN reaction that required hospitalization unknown Has patient had a PCN reaction occurring within the last 10 years: unknown If all of the above answers are "NO", then may proceed with Cephalosporin use.     Patient Measurements: Height: 5\' 4"  (162.6 cm) Weight: 141 lb 15.6 oz (64.4 kg) IBW/kg (Calculated) : 54.7  Vital Signs: Temp: 97.2 F (36.2 C) (01/26 1415) BP: 109/63 mmHg (01/26 1400) Pulse Rate: 75 (01/26 1415)  Labs:  Recent Labs  02/14/15 1430 02/15/15 0328 02/15/15 0953 02/15/15 1850 02/16/15 0409 02/16/15 0440 02/16/15 0445 02/16/15 1533  HGB 10.4* 9.5*  --   --   --  8.7*  --   --   HCT 30.9* 28.3*  --   --   --  25.3*  --   --   PLT 153 173  --   --   --  172  --   --   APTT  --   --   --   --  >200*  --   --  163*  LABPROT  --   --  25.7*  --   --   --   --   --   INR  --   --  2.38*  --   --   --   --   --   HEPARINUNFRC  --   --   --  >2.20*  --   --  >2.20*  --   CREATININE 7.97* 6.51* 6.42*  --   --  6.39*  --   --   CKTOTAL 350*  --   --   --   --   --   --   --   TROPONINI 0.03  --   --   --   --   --   --   --     Estimated Creatinine Clearance: 8.1 mL/min (by C-G formula based on Cr of 6.39).   Medical History: Past Medical History  Diagnosis Date  . Diverticulosis   . Pancreatitis 1980, 2015    in the setting of ETOH  . chronic systolic heart failure     a. ECHO (05/08/13): EF 20-25%, diff HK with akinesis of basal inferior wall, grade 1 DD,  trivial MR, trivial TR b) RHC (05/06/13): RA 7, RV 30/2, PA 31/19 (24), PCWP 10, Fick CO/CI: 2.9/1.74, Thermo CO/Ci: 2.4/1.4, PA sat 53%  . Ischemic cardiomyopathy   . CAD (coronary artery disease)     a. LHC (04/2013): Chronically occluded LAD, moderate dz in large OM1 and severe dz and small posterior lateral vessel    . Persistent atrial fibrillation ([place]) 2015  . Suicidal overdose ([place]) 10/03/13    Overdoes on Beta Blockers & Xarelto [facility] ED)  . Anxiety 2015   . Psoriasis 1968  . GERD (gastroesophageal reflux disease) 2015   . Hyperlipidemia 2015  . Hypertension 2015  . ETOH abuse 1981    started abusing alcohol at age 9, quit   . Hypothyroidism   . PONV (postoperative nausea and vomiting)   .  Mass of right breast on mammogram 07/14/2014  . AICD (automatic cardioverter/defibrillator) present   . Myocardial infarction ([place])     "I've had many" (09/13/2014)  . Anginal pain ([place])   . Anemia   . History of blood transfusion     "related to losing blood from somewhere; never found out from where"  . Stroke [facility]) 2011    left side weakness, minimal  . Depression   . 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/2014    Medications:  Prescriptions prior to admission  Medication Sig Dispense Refill Last Dose  . apixaban (ELIQUIS) 5 MG TABS tablet Take 1 tablet (5 mg total) by mouth 2 (two) times daily. 60 tablet 3 Past Week at Unknown time  . carvedilol (COREG) 12.5 MG tablet Take 1 tablet (12.5 mg total) by mouth 2 (two) times daily. 60 tablet 6 Past Week at Unknown time  . ezetimibe (ZETIA) 10 MG tablet Take 1 tablet (10 mg total) by mouth daily. 90 tablet 3 Past Week at Unknown time  . furosemide (LASIX) 20 MG tablet Take 1 tablet (20 mg total) by mouth every other day. 15 tablet 6 Past Week at Unknown time  . levothyroxine (SYNTHROID, LEVOTHROID) 25 MCG tablet Take 25 mcg by mouth daily before breakfast.    Past Week at Unknown time  . LORazepam (ATIVAN)  0.5 MG tablet Take 1 tablet (0.5 mg total) by mouth 2 (two) times daily. 60 tablet 2 Past Week at Unknown time  . pantoprazole (PROTONIX) 40 MG tablet TAKE 1 TABLET BY MOUTH TWICE DAILY BEFORE A MEAL. 60 tablet 5 Past Week at Unknown time  . rosuvastatin (CRESTOR) 40 MG tablet Take 1 tablet (40 mg total) by mouth daily. D/C order for Atorvastatin 30 tablet 3 Past Week at Unknown time  . sacubitril-valsartan (ENTRESTO) 24-26 MG Take 1 tablet by mouth 2 (two) times daily. 60 tablet 5 Past Week at Unknown time  . sertraline (ZOLOFT) 50 MG tablet Take 100 mg by mouth daily.   Past Week at Unknown time  . spironolactone (ALDACTONE) 25 MG tablet Take 0.5 tablets (12.5 mg total) by mouth daily. 15 tablet 3 Past Week at Unknown time  . acetaminophen (TYLENOL) 325 MG tablet Take 650 mg by mouth every 6 (six) hours as needed for mild pain.   Unknown at Unknown time  . ammonium lactate (AMLACTIN) 12 % cream Apply 1 g topically daily as needed for dry skin (apply to elbows).    Unknown at Unknown time  . nitroGLYCERIN (NITROSTAT) 0.3 MG SL tablet Place 0.3 mg under the tongue every 5 (five) minutes as needed for chest pain (MAX 3 TABLETS). Reported on 01/18/2015   Unknown at Unknown time  . ondansetron (ZOFRAN) 4 MG tablet Take 4 mg by mouth every 8 (eight) hours as needed for nausea or vomiting.   Unknown at Unknown time  . zolpidem (AMBIEN) 10 MG tablet Take 1 tablet (10 mg total) by mouth at bedtime as needed for sleep. 30 tablet 0 Unknown at Unknown time   Assessment: 60yo female with h/o afib, pt was on Eliquis PTA.  Started heparin yesterday. Admitted with ARF.  Not sure if eliquis is still affecting anti-Xa levels so will also monitor with aPTT.   Heparin level remains elevated at >2.2, aPTT also elevated 163 sec  Goal of Therapy:  APTT 66-102 Heparin level 0.3-0.7 units/ml Monitor platelets by anticoagulation protocol: Yes   Plan:  Hold heparin for 1 hour  then resume heparin infusion at 400  units/hr Heparin level and aPTT in 8 hrs and daily Thanks for allowing pharmacy to be a part of this patient's care.  Excell Seltzer, PharmD Clinical Pharmacist 02/16/2015,4:05 PM

## 2015-02-16 NOTE — Progress Notes (Signed)
[facility] PROGRESS NOTE  Mireia Kosek [id number] DOB: Oct 01, 1955 DOA: 02/14/2015 PCP: [name], MD  Assessment/Plan: Severe sepsis with hypotension -She remains on pressor support with Neo-Synephrine; Nursing staff was able to wean dose past 24 hours. -She still appears clinically dry. Renal has ordered increased of IVF to 125 cc/hr. -The presumed source is urine.  -Will DC vancomycin and continue cefepime given GNR UTI.  Encephalopathy -Unsure of chronicity. Likely a component of acute encephalopathy given severe illness and sepsis. However she does reside in a group home, so I suspect she has some degree of mental dis-capacity. Will attempt to contact group home today for further information. -She is able to open eyes and answer very simple questions.  GNR UTI -DC Vanc and continue cefepime. Can further narrow abx with cx data once available.  Acute renal failure on CKD Stage III -Baseline Cr appears to be around 1.2-1.3. -Suspect related to sepsis, hypotension as well as profound dehydration. -Creatinine has improved from 7.9-6.3. -Renal ultrasound without signs of hydronephrosis or obstruction. -Lasix, Aldactone, entresto have been appropriately held. -Appreciate renal input and recommendations. -Metabolic acidosis is improving with IV bicarb.  Paroxysmal atrial fibrillation -Currently rate controlled, eliquis remains on hold given degree of renal dysfunction. -Continue IV heparin. -Transition back to eliquis if renal function improved.  History of ischemic cardiomyopathy  -2-D echo from March 2016 shows an ejection fraction of 25-30% with diffuse hypokinesis and grade 2 diastolic dysfunction. - status post AICD. -Will need to be careful with fluid resuscitation, although it is clear from exam today that she remains clinically dry.   Code Status: Full code Family Communication: Patient only  Disposition Plan: To be determined. Keep in ICU  today   Consultants:  None   Antibiotics:  Vancomycin  Cefepime   Subjective: Able to answer very simple questions and follow simple commands.  Objective: Filed Vitals:   02/16/15 0745 02/16/15 0800 02/16/15 0815 02/16/15 0830  BP: 127/69 132/76 119/71 99/61  Pulse: 79 86 80 76  Temp: 97.7 F (36.5 C) 97.7 F (36.5 C) 97.7 F (36.5 C) 97.7 F (36.5 C)  TempSrc:      Resp: 14 19 13 15   Height:      Weight:      SpO2: 99% 97% 98% 100%    Intake/Output Summary (Last 24 hours) at 02/16/15 0918 Last data filed at 02/16/15 0500  Gross per 24 hour  Intake     50 ml  Output   1175 ml  Net  -1125 ml   Filed Weights   02/14/15 1945 02/15/15 0500 02/16/15 0500  Weight: 58 kg (127 lb 13.9 oz) 58 kg (127 lb 13.9 oz) 64.4 kg (141 lb 15.6 oz)    Exam:   General:  Awake, appears confused  Cardiovascular: irregular  Respiratory: coarse bilateral BS  Abdomen: S/NT/ND/+BS  Extremities: no C/C/E   Neurologic:  Moves all 4 spontaneously  Data Reviewed: Basic Metabolic Panel:  Recent Labs Lab 02/14/15 1430 02/15/15 0328 02/15/15 0953 02/16/15 0440  NA 137 136 137 140  K 5.5* 4.8 4.8 3.9  CL 101 110 113* 107  CO2 18* 14* 14* 18*  GLUCOSE 108* 142* 126* 78  BUN 65* 63* 62* 63*  CREATININE 7.97* 6.51* 6.42* 6.39*  CALCIUM 7.7* 5.8* 6.1* 5.9*  MG  --   --  1.6*  --    Liver Function Tests:  Recent Labs Lab 02/14/15 1430 02/15/15 0328 02/15/15 0953  AST 22 22  26  ALT 14 14 15   ALKPHOS 88 75 84  BILITOT 0.6 0.5 0.5  PROT 7.2 5.4* 5.4*  ALBUMIN 3.5 2.5* 2.5*   No results for input(s): LIPASE, AMYLASE in the last 168 hours. No results for input(s): AMMONIA in the last 168 hours. CBC:  Recent Labs Lab 02/14/15 1430 02/15/15 0328 02/16/15 0440  WBC 20.9* 18.2* 11.0*  HGB 10.4* 9.5* 8.7*  HCT 30.9* 28.3* 25.3*  MCV 89.8 90.7 87.2  PLT 153 173 172   Cardiac Enzymes:  Recent Labs Lab 02/14/15 1430  CKTOTAL 350*  TROPONINI 0.03   BNP  (last 3 results)  Recent Labs  04/09/14 0038  BNP 1385.0*    ProBNP (last 3 results) No results for input(s): PROBNP in the last 8760 hours.  CBG:  Recent Labs Lab 02/14/15 1438  GLUCAP 99    Recent Results (from the past 240 hour(s))  MRSA PCR Screening     Status: None   Collection Time: 02/14/15  2:08 PM  Result Value Ref Range Status   MRSA by PCR NEGATIVE NEGATIVE Final    Comment:        The GeneXpert MRSA Assay (FDA approved for NASAL specimens only), is one component of a comprehensive MRSA colonization surveillance program. It is not intended to diagnose MRSA infection nor to guide or monitor treatment for MRSA infections.   Urine culture     Status: None (Preliminary result)   Collection Time: 02/14/15  2:10 PM  Result Value Ref Range Status   Specimen Description URINE, CATHETERIZED  Final   Special Requests NONE  Final   Culture   Final    >=100,000 COLONIES/mL GRAM NEGATIVE RODS CULTURE REINCUBATED FOR BETTER GROWTH Performed at [facility]    Report Status PENDING  Incomplete  Blood Culture (routine x 2)     Status: None (Preliminary result)   Collection Time: 02/14/15  2:30 PM  Result Value Ref Range Status   Specimen Description BLOOD LEFT ANTECUBITAL DRAWN BY RN  Final   Special Requests BOTTLES DRAWN AEROBIC ONLY 6CC  Final   Culture NO GROWTH < 24 HOURS  Final   Report Status PENDING  Incomplete  Blood Culture (routine x 2)     Status: None (Preliminary result)   Collection Time: 02/15/15  3:28 AM  Result Value Ref Range Status   Specimen Description BLOOD A-LINE DRAW DRAWN BY RN  Final   Special Requests BOTTLES DRAWN AEROBIC AND ANAEROBIC 8CC EACH  Final   Culture NO GROWTH < 12 HOURS  Final   Report Status PENDING  Incomplete     Studies: Ct Head Wo Contrast  02/14/2015  CLINICAL DATA:  Altered mental status and fatigue since having 3 teeth pulled 3 days ago. EXAM: CT HEAD WITHOUT CONTRAST TECHNIQUE: Contiguous axial images  were obtained from the base of the skull through the vertex without intravenous contrast. COMPARISON:  10/02/2008 FINDINGS: Multiple small, chronic infarcts are again seen in the right MCA territory involving the frontal, parietal, and temporal lobes as well as basal ganglia. There is mild ex vacuo dilatation of the right lateral ventricle. There is no evidence of acute large territory infarct, intracranial hemorrhage, mass effect, or extra-axial fluid collection. Orbits are unremarkable. The visualized paranasal sinuses are clear. There are small, chronic bilateral mastoid effusions. Calcified atherosclerosis is noted at the skull base. IMPRESSION: 1. No evidence of acute intracranial abnormality. 2. Chronic right MCA territory infarcts. Electronically Signed   By: Logan Bores  M.D.   On: 02/14/2015 16:57   US Renal  02/15/2015  CLINICAL DATA:  Acute kidney insufficiency EXAM: RENAL / URINARY TRACT ULTRASOUND COMPLETE COMPARISON:  Abdominal ultrasound 02/03/2015 FINDINGS: Right Kidney: Length: 10.2 cm . There is normal echogenicity. No hydronephrosis. No renal calculus. Trace perinephric fluid in midpole. Left Kidney: Length: 10.5 cm. Echogenicity within normal limits. No mass or hydronephrosis visualized. Bladder: Decompressed urinary bladder with Foley catheter. IMPRESSION: No hydronephrosis. No renal calculi. Trace perinephric fluid midpole of the right kidney. Unremarkable urinary bladder. Electronically Signed   By: Lahoma Crocker M.D.   On: 02/15/2015 09:49   Dg Chest Portable 1 View  02/14/2015  CLINICAL DATA:  Repositioned IJ line. EXAM: PORTABLE CHEST 1 VIEW COMPARISON:  02/14/2015 FINDINGS: Cardiac pacemaker. Right central venous catheter has been pulled back with tip now all over the low SVC region. No pneumothorax. Heart size and pulmonary vascularity are normal. No focal airspace disease or consolidation in the lungs. No blunting of costophrenic angles. IMPRESSION: Right central venous catheter has  been pulled back with tip now over the low SVC region. No evidence of active pulmonary disease. Electronically Signed   By: Lucienne Capers M.D.   On: 02/14/2015 19:06   Dg Chest Portable 1 View  02/14/2015  CLINICAL DATA:  Status post central line placement today. Initial encounter. EXAM: PORTABLE CHEST 1 VIEW COMPARISON:  Single view of the chest earlier today. FINDINGS: A new right IJ approach central venous catheter is in place with the tip projecting in the right atrium. Recommend withdrawal of 3-3.5 cm. There is no pneumothorax. The lungs are clear. Heart size is normal. No pleural effusion. IMPRESSION: Right IJ catheter tip projects in the right atrium. Recommend withdrawal of 3-3.5 cm. Negative for pneumothorax. Electronically Signed   By: Inge Rise M.D.   On: 02/14/2015 18:14   Dg Chest Port 1 View  02/14/2015  CLINICAL DATA:  60 year old with acute onset of fatigue and mental status changes approximately 3 days ago related to having had several teeth pulled. Indwelling pacemaker. EXAM: PORTABLE CHEST 1 VIEW COMPARISON:  10/15/2014 and earlier. FINDINGS: Suboptimal inspiration accounts for crowded bronchovascular markings, especially in the bases, and accentuates the cardiac silhouette. Taking this into account, cardiac silhouette upper normal in size for AP portable technique. Lungs clear. Bronchovascular markings normal. Pulmonary vascularity normal. No visible pleural effusions. No pneumothorax. Left subclavian single lead transvenous pacemaker unchanged and appears intact. IMPRESSION: Suboptimal inspiration.  No acute cardiopulmonary disease. Electronically Signed   By: Evangeline Dakin M.D.   On: 02/14/2015 15:05    Scheduled Meds: . calcium carbonate (dosed in mg elemental calcium)  1,000 mg of elemental calcium Oral TID  . ceFEPime (MAXIPIME) IV  1 g Intravenous Q24H  . ezetimibe  10 mg Oral Daily  . levothyroxine  25 mcg Oral QAC breakfast  . pantoprazole  40 mg Oral Daily  .  rosuvastatin  40 mg Oral q1800  . sertraline  100 mg Oral Daily  . vancomycin  750 mg Intravenous Q48H   Continuous Infusions: . heparin    . norepinephrine (LEVOPHED) Adult infusion Stopped (02/16/15 0913)  .  sodium bicarbonate 150 mEq in sterile water 1000 mL infusion 100 mL/hr at 02/15/15 2237    Active Problems:   Hypertension   GERD (gastroesophageal reflux disease)   Chronic systolic CHF (congestive heart failure) (HCC)   H/O PAF   Cardiomyopathy, ischemic- EF 25-30% 2D 09/02/13   CAD- total LAD- residual OM2 disease  Suicide attempt Sept 2015   Hypothyroidism   S/P ICD (internal cardiac defibrillator) procedure, St. Jude device, 09/13/14   Sepsis ([place])   Acute renal failure (ARF) (HCC)   Chronic anticoagulation-Eliquis    Critical care time spent: 35 minutes. Greater than 50% of this time was spent in direct contact with the patient coordinating care.    Lelon Frohlich  [facility] Pager [phone]  If 7PM-7AM, please contact night-coverage at [email or web address], password [facility] 02/16/2015, 9:18 AM  LOS: 2 days

## 2015-02-17 DIAGNOSIS — N179 Acute kidney failure, unspecified: Secondary | ICD-10-CM

## 2015-02-17 DIAGNOSIS — I2510 Atherosclerotic heart disease of native coronary artery without angina pectoris: Secondary | ICD-10-CM

## 2015-02-17 DIAGNOSIS — Z7901 Long term (current) use of anticoagulants: Secondary | ICD-10-CM

## 2015-02-17 LAB — GLUCOSE, CAPILLARY: GLUCOSE-CAPILLARY: 73 mg/dL (ref 65–99)

## 2015-02-17 LAB — COMPREHENSIVE METABOLIC PANEL
ALT: 17 U/L (ref 14–54)
AST: 35 U/L (ref 15–41)
Albumin: 2 g/dL — ABNORMAL LOW (ref 3.5–5.0)
Alkaline Phosphatase: 76 U/L (ref 38–126)
Anion gap: 16 — ABNORMAL HIGH (ref 5–15)
BUN: 65 mg/dL — AB (ref 6–20)
CO2: 28 mmol/L (ref 22–32)
CREATININE: 6.34 mg/dL — AB (ref 0.44–1.00)
Calcium: 5.9 mg/dL — CL (ref 8.9–10.3)
Chloride: 99 mmol/L — ABNORMAL LOW (ref 101–111)
GFR calc non Af Amer: 6 mL/min — ABNORMAL LOW (ref >60)
GFR, EST AFRICAN AMERICAN: 7 mL/min — AB (ref >60)
Glucose, Bld: 64 mg/dL — ABNORMAL LOW (ref 65–99)
POTASSIUM: 3.2 mmol/L — AB (ref 3.5–5.1)
SODIUM: 143 mmol/L (ref 135–145)
Total Bilirubin: 1.1 mg/dL (ref 0.3–1.2)
Total Protein: 4.8 g/dL — ABNORMAL LOW (ref 6.5–8.1)

## 2015-02-17 LAB — HEPARIN LEVEL (UNFRACTIONATED)
Heparin Unfractionated: >2.2 IU/mL — ABNORMAL HIGH (ref 0.30–0.70)
Heparin Unfractionated: 1.88 IU/mL — ABNORMAL HIGH (ref 0.30–0.70)

## 2015-02-17 LAB — URINE CULTURE

## 2015-02-17 LAB — CBC
HCT: 21.7 % — ABNORMAL LOW (ref 36.0–46.0)
Hemoglobin: 7.8 g/dL — ABNORMAL LOW (ref 12.0–15.0)
MCH: 30.8 pg (ref 26.0–34.0)
MCHC: 35.9 g/dL (ref 30.0–36.0)
MCV: 85.8 fL (ref 78.0–100.0)
PLATELETS: 123 10*3/uL — AB (ref 150–400)
RBC: 2.53 MIL/uL — AB (ref 3.87–5.11)
RDW: 12.8 % (ref 11.5–15.5)
WBC: 6.2 10*3/uL (ref 4.0–10.5)

## 2015-02-17 LAB — PHOSPHORUS: PHOSPHORUS: 5 mg/dL — AB (ref 2.5–4.6)

## 2015-02-17 LAB — APTT
APTT: 125 s — AB (ref 24–37)
APTT: 79 s — AB (ref 24–37)

## 2015-02-17 LAB — PROCALCITONIN: Procalcitonin: 2.92 ng/mL

## 2015-02-17 MED ORDER — DEXTROSE 5 % IV SOLN
1.0000 g | INTRAVENOUS | Status: DC
  Administered 2015-02-17 – 2015-02-24 (×8): 1 g via INTRAVENOUS
  Filled 2015-02-17 (×11): qty 10

## 2015-02-17 MED ORDER — SODIUM CHLORIDE 0.45 % IV SOLN: INTRAVENOUS | Status: DC

## 2015-02-17 MED ORDER — POTASSIUM CHLORIDE IN NACL 20-0.45 MEQ/L-% IV SOLN
INTRAVENOUS | Status: DC
  Administered 2015-02-17: 20:00:00 via INTRAVENOUS
  Administered 2015-02-17: 1000 mL via INTRAVENOUS
  Administered 2015-02-18 – 2015-02-19 (×4): via INTRAVENOUS
  Filled 2015-02-17 (×11): qty 1000

## 2015-02-17 NOTE — Progress Notes (Signed)
[place] for Heparin Indication: atrial fibrillation  Allergies  Allergen Reactions  . Morphine And Related Other (See Comments)    Dizziness and hallucinations  . Penicillins Anaphylaxis and Rash    Has patient had a PCN reaction causing immediate rash, facial/tongue/throat swelling, SOB or lightheadedness with hypotension:unknown Has patient had a PCN reaction causing severe rash involving mucus membranes or skin necrosis: unknown Has patient had a PCN reaction that required hospitalization unknown Has patient had a PCN reaction occurring within the last 10 years: unknown If all of the above answers are "NO", then may proceed with Cephalosporin use.     Patient Measurements: Height: 5\' 4"  (162.6 cm) Weight: 143 lb 11.8 oz (65.2 kg) IBW/kg (Calculated) : 54.7  Vital Signs: Temp: 96.4 F (35.8 C) (01/27 0630) BP: 117/68 mmHg (01/27 0600) Pulse Rate: 80 (01/27 0630)  Labs:  Recent Labs  02/14/15 1430 02/15/15 0328 02/15/15 0953  02/16/15 0409 02/16/15 0440 02/16/15 0445 02/16/15 1533 02/17/15 0300  HGB 10.4* 9.5*  --   --   --  8.7*  --   --  7.8*  HCT 30.9* 28.3*  --   --   --  25.3*  --   --  21.7*  PLT 153 173  --   --   --  172  --   --  123*  APTT  --   --   --   --  >200*  --   --  163* 125*  LABPROT  --   --  25.7*  --   --   --   --   --   --   INR  --   --  2.38*  --   --   --   --   --   --   HEPARINUNFRC  --   --   --   < >  --   --  >2.20* >2.20* >2.20*  CREATININE 7.97* 6.51* 6.42*  --   --  6.39*  --   --  6.34*  CKTOTAL 350*  --   --   --   --   --   --   --   --   TROPONINI 0.03  --   --   --   --   --   --   --   --   < > = values in this interval not displayed.  Estimated Creatinine Clearance: 8.1 mL/min (by C-G formula based on Cr of 6.34).   Medical History: Past Medical History  Diagnosis Date  . Diverticulosis   . Pancreatitis 1980, 2015    in the setting of ETOH  . chronic systolic heart failure      a. ECHO (05/08/13): EF 20-25%, diff HK with akinesis of basal inferior wall, grade 1 DD, trivial MR, trivial TR b) RHC (05/06/13): RA 7, RV 30/2, PA 31/19 (24), PCWP 10, Fick CO/CI: 2.9/1.74, Thermo CO/Ci: 2.4/1.4, PA sat 53%  . Ischemic cardiomyopathy   . CAD (coronary artery disease)     a. LHC (04/2013): Chronically occluded LAD, moderate dz in large OM1 and severe dz and small posterior lateral vessel    . Persistent atrial fibrillation ([place]) 2015  . Suicidal overdose ([place]) 10/03/13    [place] on Beta Blockers & Xarelto [facility] ED)  . Anxiety 2015   . Psoriasis 1968  . GERD (gastroesophageal reflux disease) 2015   . Hyperlipidemia 2015  . Hypertension 2015  .  ETOH abuse 1981    started abusing alcohol at age 34, quit   . Hypothyroidism   . PONV (postoperative nausea and vomiting)   . Mass of right breast on mammogram 07/14/2014  . AICD (automatic cardioverter/defibrillator) present   . Myocardial infarction ([place])     "I've had many" (09/13/2014)  . Anginal pain ([place])   . Anemia   . History of blood transfusion     "related to losing blood from somewhere; never found out from where"  . Stroke [facility]) 2011    left side weakness, minimal  . Depression   . 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/2014    Medications:  Prescriptions prior to admission  Medication Sig Dispense Refill Last Dose  . apixaban (ELIQUIS) 5 MG TABS tablet Take 1 tablet (5 mg total) by mouth 2 (two) times daily. 60 tablet 3 Past Week at Unknown time  . carvedilol (COREG) 12.5 MG tablet Take 1 tablet (12.5 mg total) by mouth 2 (two) times daily. 60 tablet 6 Past Week at Unknown time  . ezetimibe (ZETIA) 10 MG tablet Take 1 tablet (10 mg total) by mouth daily. 90 tablet 3 Past Week at Unknown time  . furosemide (LASIX) 20 MG tablet Take 1 tablet (20 mg total) by mouth every other day. 15 tablet 6 Past Week at Unknown time  . levothyroxine (SYNTHROID, LEVOTHROID) 25 MCG tablet Take  25 mcg by mouth daily before breakfast.    Past Week at Unknown time  . LORazepam (ATIVAN) 0.5 MG tablet Take 1 tablet (0.5 mg total) by mouth 2 (two) times daily. 60 tablet 2 Past Week at Unknown time  . pantoprazole (PROTONIX) 40 MG tablet TAKE 1 TABLET BY MOUTH TWICE DAILY BEFORE A MEAL. 60 tablet 5 Past Week at Unknown time  . rosuvastatin (CRESTOR) 40 MG tablet Take 1 tablet (40 mg total) by mouth daily. D/C order for Atorvastatin 30 tablet 3 Past Week at Unknown time  . sacubitril-valsartan (ENTRESTO) 24-26 MG Take 1 tablet by mouth 2 (two) times daily. 60 tablet 5 Past Week at Unknown time  . sertraline (ZOLOFT) 50 MG tablet Take 100 mg by mouth daily.   Past Week at Unknown time  . spironolactone (ALDACTONE) 25 MG tablet Take 0.5 tablets (12.5 mg total) by mouth daily. 15 tablet 3 Past Week at Unknown time  . acetaminophen (TYLENOL) 325 MG tablet Take 650 mg by mouth every 6 (six) hours as needed for mild pain.   Unknown at Unknown time  . ammonium lactate (AMLACTIN) 12 % cream Apply 1 g topically daily as needed for dry skin (apply to elbows).    Unknown at Unknown time  . nitroGLYCERIN (NITROSTAT) 0.3 MG SL tablet Place 0.3 mg under the tongue every 5 (five) minutes as needed for chest pain (MAX 3 TABLETS). Reported on 01/18/2015   Unknown at Unknown time  . ondansetron (ZOFRAN) 4 MG tablet Take 4 mg by mouth every 8 (eight) hours as needed for nausea or vomiting.   Unknown at Unknown time  . zolpidem (AMBIEN) 10 MG tablet Take 1 tablet (10 mg total) by mouth at bedtime as needed for sleep. 30 tablet 0 Unknown at Unknown time   Assessment: 60yo female with h/o afib, pt was on Eliquis PTA.  Started heparin therapy. Admitted with ARF.  Heparin level remains elevated at >2.2 indicating that eliquis is still not cleared. aPTT also elevated 125 sec but is trending down with heparin rate adjustment  Hg trending down but no overt bleeding noted  Goal of Therapy:  APTT 66-102 Heparin level  0.3-0.7 units/ml Monitor platelets by anticoagulation protocol: Yes   Plan:  Decrease heparin infusion at 350 units/hr Heparin level and aPTT in 8 hrs and daily Thanks for allowing pharmacy to be a part of this patient's care.  Excell Seltzer, PharmD Clinical Pharmacist 02/17/2015,7:25 AM

## 2015-02-17 NOTE — Clinical Social Work Note (Signed)
Clinical Social Work Assessment  Patient Details  Name: [name] MRN: [id number] Date of Birth: 08-29-1955  Date of referral:  02/17/15               Reason for consult:  Facility Placement                Permission sought to share information with:  Other [facility] staff) Permission granted to share information::     Name::        Agency::     Relationship::     Contact Information:     Housing/Transportation Living arrangements for the past 2 months:  [place] of Information:  Facility Patient Interpreter Needed:  None Criminal Activity/Legal Involvement Pertinent to Current Situation/Hospitalization:  No - Comment as needed Significant Relationships:  Other(Comment) Lives with:  Facility Resident Do you feel safe going back to the place where you live?  Yes Need for family participation in patient care:  Yes (Comment)  Care giving concerns:  Admitted from [facility] where she is resided long term.  They are prepared to take her back at dc- stating that she is competent and is working on transitioning to [place] from [facility].     Social Worker assessment / plan:  CSW spoke with April Ellison   Employment status:  Disabled (Comment on whether or not currently receiving Disability) Insurance information:    PT Recommendations:  Not assessed at this time Information / Referral to community resources:     Patient/Family's Response to care:  Agreeable to plans for return at [employer]- they are able to provide more assistance and have used [facility] in the past for [facility] (PT,etc).   Patient/Family's Understanding of and Emotional Response to Diagnosis, Current Treatment, and Prognosis:  good  Emotional Assessment Appearance:  Developmentally appropriate Attitude/Demeanor/Rapport:    Affect (typically observed):  Unable to Assess Orientation:  Fluctuating Orientation (Suspected and/or reported Sundowners) Alcohol / Substance use:    Psych involvement (Current  and /or in the community):     Discharge Needs  Concerns to be addressed:  Discharge Planning Concerns Readmission within the last 30 days:  No Current discharge risk:  None Barriers to Discharge:  Other   Ludwig Clarks, LCSW 02/17/2015, 1:17 PM

## 2015-02-17 NOTE — Progress Notes (Signed)
Primary Cardiologist: [name] MD  Cardiology Specific Problem List: 1. CAD 2. PAF 3. CHF 4. ICM ICD in situ (St. Jude)  Subjective:    Weak but no complaints of chest pain or dyspnea.   Objective:   Temp:  [96.3 F (35.7 C)-97.7 F (36.5 C)] 96.4 F (35.8 C) (01/27 0630) Pulse Rate:  [67-88] 80 (01/27 0630) Resp:  [12-21] 17 (01/27 0630) BP: (87-129)/(58-71) 117/68 mmHg (01/27 0600) SpO2:  [87 %-100 %] 99 % (01/27 0807) Weight:  [143 lb 11.8 oz (65.2 kg)] 143 lb 11.8 oz (65.2 kg) (01/27 0500) Last BM Date: 02/17/15  Filed Weights   02/15/15 0500 02/16/15 0500 02/17/15 0500  Weight: 127 lb 13.9 oz (58 kg) 141 lb 15.6 oz (64.4 kg) 143 lb 11.8 oz (65.2 kg)    Intake/Output Summary (Last 24 hours) at 02/17/15 0930 Last data filed at 02/17/15 0500  Gross per 24 hour  Intake      0 ml  Output   1450 ml  Net  -1450 ml    Telemetry: NSR  Exam:  General: No acute distress.Ill appearing   HEENT: Conjunctiva and lids normal, oropharynx clear.  Lungs: Diminished bibasilar, some inspiratory wheezes. Poor inspiratory effort.   Cardiac: Mildly elevated JVP or bruits. RRR, no gallop or rub.   Abdomen: Normoactive bowel sounds, nontender, nondistended.  Extremities: No pitting edema, distal pulses full. Some edema of the right arm, tight BP cuff.   Neuropsychiatric: Alert and oriented x3, affect flat  Lab Results:  Basic Metabolic Panel:  Recent Labs Lab 02/15/15 0953 02/16/15 0440 02/17/15 0300  NA 137 140 143  K 4.8 3.9 3.2*  CL 113* 107 99*  CO2 14* 18* 28  GLUCOSE 126* 78 64*  BUN 62* 63* 65*  CREATININE 6.42* 6.39* 6.34*  CALCIUM 6.1* 5.9* 5.9*  MG 1.6*  --   --     Liver Function Tests:  Recent Labs Lab 02/15/15 0328 02/15/15 0953 02/17/15 0300  AST 22 26 35  ALT 14 15 17   ALKPHOS 75 84 76  BILITOT 0.5 0.5 1.1  PROT 5.4* 5.4* 4.8*  ALBUMIN 2.5* 2.5* 2.0*    CBC:  Recent Labs Lab 02/15/15 0328 02/16/15 0440 02/17/15 0300   WBC 18.2* 11.0* 6.2  HGB 9.5* 8.7* 7.8*  HCT 28.3* 25.3* 21.7*  MCV 90.7 87.2 85.8  PLT 173 172 123*    Cardiac Enzymes:  Recent Labs Lab 02/14/15 1430  CKTOTAL 350*  TROPONINI 0.03    Coagulation:  Recent Labs Lab 02/15/15 0953  INR 2.38*    Radiology: US Renal  02/15/2015  CLINICAL DATA:  Acute kidney insufficiency EXAM: RENAL / URINARY TRACT ULTRASOUND COMPLETE COMPARISON:  Abdominal ultrasound 02/03/2015 FINDINGS: Right Kidney: Length: 10.2 cm . There is normal echogenicity. No hydronephrosis. No renal calculus. Trace perinephric fluid in midpole. Left Kidney: Length: 10.5 cm. Echogenicity within normal limits. No mass or hydronephrosis visualized. Bladder: Decompressed urinary bladder with Foley catheter. IMPRESSION: No hydronephrosis. No renal calculi. Trace perinephric fluid midpole of the right kidney. Unremarkable urinary bladder. Electronically Signed   By: Lahoma Crocker M.D.   On: 02/15/2015 09:49     Medications:   Scheduled Medications: . calcium carbonate (dosed in mg elemental calcium)  1,000 mg of elemental calcium Oral TID  . ceFEPime (MAXIPIME) IV  1 g Intravenous Q24H  . ezetimibe  10 mg Oral Daily  . levothyroxine  25 mcg Oral QAC breakfast  . pantoprazole  40 mg Oral  Daily  . rosuvastatin  40 mg Oral q1800  . sertraline  100 mg Oral Daily    Infusions: . 0.45 % NaCl with KCl 20 mEq / L    . heparin 350 Units/hr (02/17/15 0428)  . norepinephrine (LEVOPHED) Adult infusion Stopped (02/16/15 0913)    PRN Medications: acetaminophen, ondansetron **OR** ondansetron (ZOFRAN) IV, zolpidem   Assessment and Plan:   1. Acute on Chronic Systolic CHF in the setting of ICM: EF of 25%-30%. She has not had significant diureses. Has been off of diuretics in the setting of hypotension, requiring levophed. Minimal evidence of fluid overload. At home she is on po lasix 20 mg daily, and spironolactone. This is also on hold due to acute renal insufficiency. Potassium  is 3.2 this am. Will replete. Lungs are mildly congested in the upper lobes, with poor inspiratory effort. Continue O2.   2. Anemia; Hgb is decreasing. Down almost 1 gm over last 24 hours., 2 gms since admission. Likely due to renal insufficiency. Nephrology following. Watch for recurrence of atrial fib in this setting.   3. PAF: Now in NSR, but tachycardic She is not on anticoagulation. Would not start this in the setting of anemia.   4. Sepsis: Abx continue.      Phill Myron. [place] NP [place]  02/17/2015, 9:30 AM   Patient examined chart reviewed renal failure not improving continue diuresis.  Would transfuse at least one unit With additional lasix if needed.  No evidence of acute coronary event maintaining NSR agree with holding anticoagulation At this time  Jenkins Rouge

## 2015-02-17 NOTE — Progress Notes (Addendum)
[facility] PROGRESS NOTE  [name] [id number] DOB: Feb 28, 1955 DOA: 02/14/2015 PCP: [name], MD  Assessment/Plan: Severe sepsis with hypotension -Pressors have been off for close to 24 hours. -Fluid management as per nephrology. -The presumed source is urine.  -Will change abx to rocephin. Can switch to cipro at time of DC if abx still required.  Encephalopathy -Unsure of chronicity. Likely a component of acute encephalopathy given severe illness and sepsis. However she does reside in a group home, so I suspect she has some degree of mental dis-capacity. Will attempt to contact group home today for further information. -She is much more awake today. Asks me where Santiago Glad, her sister is.  E Coli UTI -Rocephin as above.  Acute renal failure on CKD Stage III -Baseline Cr appears to be around 1.2-1.3. -Suspect related to sepsis, hypotension as well as profound dehydration. -Creatinine without much change since yesterday, still 6.3. -Renal ultrasound without signs of hydronephrosis or obstruction. -Lasix, Aldactone, entresto have been appropriately held. -Appreciate renal input and recommendations. -Metabolic acidosis has resolved. Will DC bicarb in IVF.  Paroxysmal atrial fibrillation -Currently rate controlled, eliquis remains on hold given degree of renal dysfunction. -Continue IV heparin.  History of ischemic cardiomyopathy  -2-D echo from March 2016 shows an ejection fraction of 25-30% with diffuse hypokinesis and grade 2 diastolic dysfunction. - status post AICD.  Code Status: Full code Family Communication: Patient only  Disposition Plan: Transfer to floor. Start PT. Probable DC back to group home in 24-48 hours if renal function allows.   Consultants:  Cardiology  Nephrology   Antibiotics:  Rocephin  Subjective: More alert today.  Objective: Filed Vitals:   02/17/15 0500 02/17/15 0600 02/17/15 0630 02/17/15 0807  BP: 117/70  117/68    Pulse: 72 72 80   Temp: 96.3 F (35.7 C) 96.3 F (35.7 C) 96.4 F (35.8 C)   TempSrc:      Resp: 12 12 17    Height:      Weight: 65.2 kg (143 lb 11.8 oz)     SpO2: 99% 99% 95% 99%    Intake/Output Summary (Last 24 hours) at 02/17/15 1045 Last data filed at 02/17/15 0500  Gross per 24 hour  Intake      0 ml  Output   1450 ml  Net  -1450 ml   Filed Weights   02/15/15 0500 02/16/15 0500 02/17/15 0500  Weight: 58 kg (127 lb 13.9 oz) 64.4 kg (141 lb 15.6 oz) 65.2 kg (143 lb 11.8 oz)    Exam:   General:  Awake, appears confused  Cardiovascular: irregular  Respiratory: coarse bilateral BS  Abdomen: S/NT/ND/+BS  Extremities: no C/C/E   Neurologic:  Moves all 4 spontaneously  Data Reviewed: Basic Metabolic Panel:  Recent Labs Lab 02/14/15 1430 02/15/15 0328 02/15/15 0953 02/16/15 0440 02/17/15 0300  NA 137 136 137 140 143  K 5.5* 4.8 4.8 3.9 3.2*  CL 101 110 113* 107 99*  CO2 18* 14* 14* 18* 28  GLUCOSE 108* 142* 126* 78 64*  BUN 65* 63* 62* 63* 65*  CREATININE 7.97* 6.51* 6.42* 6.39* 6.34*  CALCIUM 7.7* 5.8* 6.1* 5.9* 5.9*  MG  --   --  1.6*  --   --   PHOS  --   --   --   --  5.0*   Liver Function Tests:  Recent Labs Lab 02/14/15 1430 02/15/15 0328 02/15/15 0953 02/17/15 0300  AST 22 22 26  35  ALT 14  14 15 17   ALKPHOS 88 75 84 76  BILITOT 0.6 0.5 0.5 1.1  PROT 7.2 5.4* 5.4* 4.8*  ALBUMIN 3.5 2.5* 2.5* 2.0*   No results for input(s): LIPASE, AMYLASE in the last 168 hours. No results for input(s): AMMONIA in the last 168 hours. CBC:  Recent Labs Lab 02/14/15 1430 02/15/15 0328 02/16/15 0440 02/17/15 0300  WBC 20.9* 18.2* 11.0* 6.2  HGB 10.4* 9.5* 8.7* 7.8*  HCT 30.9* 28.3* 25.3* 21.7*  MCV 89.8 90.7 87.2 85.8  PLT 153 173 172 123*   Cardiac Enzymes:  Recent Labs Lab 02/14/15 1430  CKTOTAL 350*  TROPONINI 0.03   BNP (last 3 results)  Recent Labs  04/09/14 0038  BNP 1385.0*    ProBNP (last 3 results) No  results for input(s): PROBNP in the last 8760 hours.  CBG:  Recent Labs Lab 02/14/15 1438  GLUCAP 99    Recent Results (from the past 240 hour(s))  MRSA PCR Screening     Status: None   Collection Time: 02/14/15  2:08 PM  Result Value Ref Range Status   MRSA by PCR NEGATIVE NEGATIVE Final    Comment:        The GeneXpert MRSA Assay (FDA approved for NASAL specimens only), is one component of a comprehensive MRSA colonization surveillance program. It is not intended to diagnose MRSA infection nor to guide or monitor treatment for MRSA infections.   Urine culture     Status: None   Collection Time: 02/14/15  2:10 PM  Result Value Ref Range Status   Specimen Description URINE, CATHETERIZED  Final   Special Requests NONE  Final   Culture   Final    >=100,000 COLONIES/mL ESCHERICHIA COLI Performed at [facility]    Report Status 02/17/2015 FINAL  Final   Organism ID, Bacteria ESCHERICHIA COLI  Final      Susceptibility   Escherichia coli - MIC*    AMPICILLIN >=32 RESISTANT Resistant     CEFAZOLIN <=4 SENSITIVE Sensitive     CEFTRIAXONE <=1 SENSITIVE Sensitive     CIPROFLOXACIN <=0.25 SENSITIVE Sensitive     GENTAMICIN <=1 SENSITIVE Sensitive     IMIPENEM <=0.25 SENSITIVE Sensitive     NITROFURANTOIN <=16 SENSITIVE Sensitive     TRIMETH/SULFA <=20 SENSITIVE Sensitive     AMPICILLIN/SULBACTAM 16 INTERMEDIATE Intermediate     PIP/TAZO <=4 SENSITIVE Sensitive     * >=100,000 COLONIES/mL ESCHERICHIA COLI  Blood Culture (routine x 2)     Status: None (Preliminary result)   Collection Time: 02/14/15  2:30 PM  Result Value Ref Range Status   Specimen Description BLOOD LEFT ANTECUBITAL DRAWN BY RN  Final   Special Requests BOTTLES DRAWN AEROBIC ONLY 6CC  Final   Culture NO GROWTH 3 DAYS  Final   Report Status PENDING  Incomplete  Blood Culture (routine x 2)     Status: None (Preliminary result)   Collection Time: 02/15/15  3:28 AM  Result Value Ref Range Status    Specimen Description BLOOD A-LINE DRAW DRAWN BY RN JD  Final   Special Requests BOTTLES DRAWN AEROBIC AND ANAEROBIC 8CC EACH  Final   Culture NO GROWTH 2 DAYS  Final   Report Status PENDING  Incomplete     Studies: No results found.  Scheduled Meds: . calcium carbonate (dosed in mg elemental calcium)  1,000 mg of elemental calcium Oral TID  . ceFEPime (MAXIPIME) IV  1 g Intravenous Q24H  . ezetimibe  10 mg Oral  Daily  . levothyroxine  25 mcg Oral QAC breakfast  . pantoprazole  40 mg Oral Daily  . rosuvastatin  40 mg Oral q1800  . sertraline  100 mg Oral Daily   Continuous Infusions: . 0.45 % NaCl with KCl 20 mEq / L    . heparin 350 Units/hr (02/17/15 0428)  . norepinephrine (LEVOPHED) Adult infusion Stopped (02/16/15 0913)    Active Problems:   Hypertension   GERD (gastroesophageal reflux disease)   Chronic systolic CHF (congestive heart failure) (HCC)   H/O PAF   Cardiomyopathy, ischemic- EF 25-30% 2D 09/02/13   CAD- total LAD- residual OM2 disease   Suicide attempt Sept 2015   Hypothyroidism   S/P ICD (internal cardiac defibrillator) procedure, St[employer] Jude device, 09/13/14   Sepsis ([place])   Acute renal failure (ARF) (HCC)   Chronic anticoagulation-Eliquis    Time spent: 25 minutes. Greater than 50% of this time was spent in direct contact with the patient coordinating care.    Lelon Frohlich  [facility] Pager [phone]  If 7PM-7AM, please contact night-coverage at [email or web address], password [facility] 02/17/2015, 10:45 AM  LOS: 3 days

## 2015-02-17 NOTE — Progress Notes (Signed)
[name] for Heparin Indication: atrial fibrillation  Allergies  Allergen Reactions  . Morphine And Related Other (See Comments)    Dizziness and hallucinations  . Penicillins Anaphylaxis and Rash    Has patient had a PCN reaction causing immediate rash, facial/tongue/throat swelling, SOB or lightheadedness with hypotension:unknown Has patient had a PCN reaction causing severe rash involving mucus membranes or skin necrosis: unknown Has patient had a PCN reaction that required hospitalization unknown Has patient had a PCN reaction occurring within the last 10 years: unknown If all of the above answers are "NO", then may proceed with Cephalosporin use.    Patient Measurements: Height: 5\' 4"  (162.6 cm) Weight: 143 lb 11.8 oz (65.2 kg) IBW/kg (Calculated) : 54.7  Vital Signs: Temp: 96.8 F (36 C) (01/27 1200) BP: 116/73 mmHg (01/27 0900) Pulse Rate: 73 (01/27 1200)  Labs:  Recent Labs  02/14/15 1430 02/15/15 0328 02/15/15 0953  02/16/15 0440  02/16/15 1533 02/17/15 0300 02/17/15 1151  HGB 10.4* 9.5*  --   --  8.7*  --   --  7.8*  --   HCT 30.9* 28.3*  --   --  25.3*  --   --  21.7*  --   PLT 153 173  --   --  172  --   --  123*  --   APTT  --   --   --   < >  --   --  163* 125* 79*  LABPROT  --   --  25.7*  --   --   --   --   --   --   INR  --   --  2.38*  --   --   --   --   --   --   HEPARINUNFRC  --   --   --   < >  --   < > >2.20* >2.20* 1.88*  CREATININE 7.97* 6.51* 6.42*  --  6.39*  --   --  6.34*  --   CKTOTAL 350*  --   --   --   --   --   --   --   --   TROPONINI 0.03  --   --   --   --   --   --   --   --   < > = values in this interval not displayed.  Estimated Creatinine Clearance: 8.1 mL/min (by C-G formula based on Cr of 6.34).  Medical History: Past Medical History  Diagnosis Date  . Diverticulosis   . Pancreatitis 1980, 2015    in the setting of ETOH  . chronic systolic heart failure     a. ECHO  (05/08/13): EF 20-25%, diff HK with akinesis of basal inferior wall, grade 1 DD, trivial MR, trivial TR b) RHC (05/06/13): RA 7, RV 30/2, PA 31/19 (24), PCWP 10, Fick CO/CI: 2.9/1.74, Thermo CO/Ci: 2.4/1.4, PA sat 53%  . Ischemic cardiomyopathy   . CAD (coronary artery disease)     a. LHC (04/2013): Chronically occluded LAD, moderate dz in large OM1 and severe dz and small posterior lateral vessel    . Persistent atrial fibrillation ([place]) 2015  . Suicidal overdose ([place]) 10/03/13    [place] on Beta Blockers & Xarelto [facility] ED)  . Anxiety 2015   . Psoriasis 1968  . GERD (gastroesophageal reflux disease) 2015   . Hyperlipidemia 2015  . Hypertension 2015  . ETOH abuse 1981  started abusing alcohol at age 13, quit   . Hypothyroidism   . PONV (postoperative nausea and vomiting)   . Mass of right breast on mammogram 07/14/2014  . AICD (automatic cardioverter/defibrillator) present   . Myocardial infarction ([place])     "I've had many" (09/13/2014)  . Anginal pain ([place])   . Anemia   . History of blood transfusion     "related to losing blood from somewhere; never found out from where"  . Stroke [facility]) 2011    left side weakness, minimal  . Depression   . 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/2014   Medications:  Prescriptions prior to admission  Medication Sig Dispense Refill Last Dose  . apixaban (ELIQUIS) 5 MG TABS tablet Take 1 tablet (5 mg total) by mouth 2 (two) times daily. 60 tablet 3 Past Week at Unknown time  . carvedilol (COREG) 12.5 MG tablet Take 1 tablet (12.5 mg total) by mouth 2 (two) times daily. 60 tablet 6 Past Week at Unknown time  . ezetimibe (ZETIA) 10 MG tablet Take 1 tablet (10 mg total) by mouth daily. 90 tablet 3 Past Week at Unknown time  . furosemide (LASIX) 20 MG tablet Take 1 tablet (20 mg total) by mouth every other day. 15 tablet 6 Past Week at Unknown time  . levothyroxine (SYNTHROID, LEVOTHROID) 25 MCG tablet Take 25 mcg by  mouth daily before breakfast.    Past Week at Unknown time  . LORazepam (ATIVAN) 0.5 MG tablet Take 1 tablet (0.5 mg total) by mouth 2 (two) times daily. 60 tablet 2 Past Week at Unknown time  . pantoprazole (PROTONIX) 40 MG tablet TAKE 1 TABLET BY MOUTH TWICE DAILY BEFORE A MEAL. 60 tablet 5 Past Week at Unknown time  . rosuvastatin (CRESTOR) 40 MG tablet Take 1 tablet (40 mg total) by mouth daily. D/C order for Atorvastatin 30 tablet 3 Past Week at Unknown time  . sacubitril-valsartan (ENTRESTO) 24-26 MG Take 1 tablet by mouth 2 (two) times daily. 60 tablet 5 Past Week at Unknown time  . sertraline (ZOLOFT) 50 MG tablet Take 100 mg by mouth daily.   Past Week at Unknown time  . spironolactone (ALDACTONE) 25 MG tablet Take 0.5 tablets (12.5 mg total) by mouth daily. 15 tablet 3 Past Week at Unknown time  . acetaminophen (TYLENOL) 325 MG tablet Take 650 mg by mouth every 6 (six) hours as needed for mild pain.   Unknown at Unknown time  . ammonium lactate (AMLACTIN) 12 % cream Apply 1 g topically daily as needed for dry skin (apply to elbows).    Unknown at Unknown time  . nitroGLYCERIN (NITROSTAT) 0.3 MG SL tablet Place 0.3 mg under the tongue every 5 (five) minutes as needed for chest pain (MAX 3 TABLETS). Reported on 01/18/2015   Unknown at Unknown time  . ondansetron (ZOFRAN) 4 MG tablet Take 4 mg by mouth every 8 (eight) hours as needed for nausea or vomiting.   Unknown at Unknown time  . zolpidem (AMBIEN) 10 MG tablet Take 1 tablet (10 mg total) by mouth at bedtime as needed for sleep. 30 tablet 0 Unknown at Unknown time   Assessment: 60yo female with h/o afib, pt was on Eliquis PTA.  Started heparin therapy. Admitted with ARF.  Heparin level remains elevated (most likely falsely elevated due to h/o Eliquis use) indicating that eliquis is still not cleared. aPTT is now therapeutic.  No bleeding reported.  Goal of Therapy:  APTT 66-102 Monitor platelets by anticoagulation protocol: Yes    Plan:  Continue heparin infusion at 350 units/hr Heparin level and aPTT daily CBC daily while on Heparin  Thanks for allowing pharmacy to be a part of this patient's care.  Hart Robinsons, PharmD Clinical Pharmacist 02/17/2015,1:40 PM

## 2015-02-17 NOTE — Progress Notes (Signed)
CSW continues to attempt to reach staff at [facility] to complete assessment and discuss dc plan/disposition. Noted PT is pending as well.

## 2015-02-17 NOTE — Progress Notes (Signed)
Subjective: Patient feels better but claims still to be confused. Her appetite is poor but she denies any nausea   Objective: Vital signs in last 24 hours: Temp:  [96.3 F (35.7 C)-97.7 F (36.5 C)] 96.4 F (35.8 C) (01/27 0630) Pulse Rate:  [67-88] 80 (01/27 0630) Resp:  [12-21] 17 (01/27 0630) BP: (87-129)/(58-71) 117/68 mmHg (01/27 0600) SpO2:  [87 %-100 %] 99 % (01/27 0807) Weight:  [143 lb 11.8 oz (65.2 kg)] 143 lb 11.8 oz (65.2 kg) (01/27 0500)  Intake/Output from previous day: 01/26 0701 - 01/27 0700 In: -  Out: 1450 [Urine:1450] Intake/Output this shift:     Recent Labs  02/14/15 1430 02/15/15 0328 02/16/15 0440 02/17/15 0300  HGB 10.4* 9.5* 8.7* 7.8*    Recent Labs  02/16/15 0440 02/17/15 0300  WBC 11.0* 6.2  RBC 2.90* 2.53*  HCT 25.3* 21.7*  PLT 172 123*    Recent Labs  02/16/15 0440 02/17/15 0300  NA 140 143  K 3.9 3.2*  CL 107 99*  CO2 18* 28  BUN 63* 65*  CREATININE 6.39* 6.34*  GLUCOSE 78 64*  CALCIUM 5.9* 5.9*    Recent Labs  02/15/15 0953  INR 2.38*    Generally patient is alert and in no apparent distress Chest: She is clear Heart exam regular rate and rhythm no murmur Abdomen: Positive bowel sounds Extremities no edema  Assessment/Plan: Problem #1 acute kidney injury superimposed on chronic. Etiology was thought to be secondary to combination of ATN/prerenal/RAAS. Presently patient is nonoliguric and her renal function. Problem #2 metabolic acidosis: CO2 is 28 and improving. Patient is on sodium bicarbonate supplement with her IV fluid. Problem #3 history of ischemic cardiomyopathy with low ejection fraction. She is also status post AICD placement. Presently patient doesn't have any sign of fluid overload and she had about 1400 mL of urine output the last 24 hours. Problem #4 anemia: Her hemoglobin is declining Problem #5 history of hypertension: Her blood pressure is presently was in acceptable range however she has episode  of hypotension. Problem #6 history of chronic renal failure. Patient creatinine was 1.26 with a GFR of 46 mL/m about 4 weeks ago on 01/18/2015. That's his stage III chronic renal failure. Ultrasound of the kidneys showed normal kidney and there is no hydronephrosis. Problem #7 hypocalcemia: Her calcium is 5.9. Corrected calcium for albumin of 2.0 is 7.5 on calcium supplement and stable Problem#8 Hypokalmia Plan: 1] We'll d/c sodium bicarbonate 2] Start on 1/2 ns with 20 meq kcl at 135 cc/hr 3] we'll check compressive metabolic panel in am   [facility] S 02/17/2015, 8:25 AM

## 2015-02-17 NOTE — Evaluation (Signed)
Physical Therapy Evaluation Patient Details Name: [name] MRN: [id number] DOB: 1955-08-28 Today's Date: 02/17/2015   History of Present Illness  60yo white female who [facility] after 2 days of lethargy, AMS, and poor oral intake (food and fluids) s/p dental procedure PTA. Upon arrival pt found to have AKI, hypotensive, and hypothermic. PMH is dense with cardiac problems, including EF 25-30%, AICD, ICD, CAD, afib, ischemic cardiomyopathy, MI, chronic systolic heart failure, as well as ETOH abuse, SI, and CVA. At [place] pt lives at a group home Indep in all ADL, adn ambulating limited community distances  (500-108ft) without AD. Pt denies falls history prior to this episode.    Clinical Impression  *PT received verbal confirmation to proceed with PT evaluation in light of precautions outlined in prior PT note. Last labs available showing Hb: 7.8, HCT: 21.7, Ca++:5.9.  Pt is received semirecumbent in bed upon entry, awake, alert, and willing to participate. Pt is Ox4 and is able to provide a good description of her baseline functional mobility. Pt appears to be somewhat drowsy and reports to continue to feel somewhat with AMS. Pt reporting a HA since this episode centralized to the R eye and forehead. With bed mobility, pt also reports some acute arresting CP, wincing and unable to rate, which requires 5 minutes recovery to resolve. This CP coinsides with PVC's into the high 30's (3-6 at rest upon entry), worsening dizziness, and dry heaving. Pt does requires min-modA for moving to EOB after turning, but is interrupted by the aforementioned episode. Swelling in noted in bilat hands and feet. Pt reports zero falls in the last 6 months, and says balance is generally good. Pt received on O2 donned around the forehead at 96% hence remains on RA throughout evaluation, but desaturating with activity (88%) recovering within 30 seconds, whereas pt is not on O2 at baseline at home. Patient presenting with  impairment of strength, oxygen perfusion, cognition, and activity tolerance, limiting ability to perform ADL and functional mobility at baseline level of function. Patient will benefit from skilled intervention to address the above impairments and limitations, in order to restore to prior level of function, improve patient safety upon discharge, and to decrease falls risk.       Follow Up Recommendations SNF;Supervision for mobility/OOB    Equipment Recommendations  None recommended by PT    Recommendations for Other Services       Precautions / Restrictions Precautions Precautions: None Restrictions Weight Bearing Restrictions: No      Mobility  Bed Mobility Overal bed mobility: Needs Assistance Bed Mobility: Supine to Sit;Sit to Supine     Supine to sit: Min assist Sit to supine: Modified independent (Device/Increase time)   General bed mobility comments: halfway to EOB prior to increase in PVC's, onset CP, worse lightheadedness, and nausea. Returned pt to semirecumbent.   Transfers                    Ambulation/Gait                Stairs            Wheelchair Mobility    Modified Rankin (Stroke Patients Only)       Balance                                             Pertinent Vitals/Pain  Pain Assessment: Faces (HA near the R eye/forehead since admission; CP with exertion; does not rate.  ) Faces Pain Scale: Hurts even more Pain Intervention(s): Limited activity within patient's tolerance;Monitored during session;Repositioned    Home Living Family/patient expects to be discharged to:: Group home                 Additional Comments: indep in ADL, some help with housework and IADL.     Prior Function Level of Independence: Independent               Hand Dominance        Extremity/Trunk Assessment   Upper Extremity Assessment: Generalized weakness           Lower Extremity Assessment:  Generalized weakness         Communication   Communication:  (some word finding difficulty throughout. )  Cognition Arousal/Alertness: Awake/alert;Lethargic Behavior During Therapy: WFL for tasks assessed/performed Overall Cognitive Status: Impaired/Different from baseline (self reported cognitive difficulty that is different from baseline. ) Area of Impairment:  (some word findging difficulty.)                    General Comments      Exercises        Assessment/Plan    PT Assessment Patient needs continued PT services  PT Diagnosis Generalized weakness;Altered mental status   PT Problem List Decreased strength;Decreased range of motion;Decreased activity tolerance;Decreased balance;Decreased mobility;Decreased coordination;Decreased cognition;Cardiopulmonary status limiting activity;Pain  PT Treatment Interventions Gait training;DME instruction;Stair training;Functional mobility training;Therapeutic activities;Therapeutic exercise;Balance training;Cognitive remediation;Patient/family education   PT Goals (Current goals can be found in the Care Plan section) Acute Rehab PT Goals Patient Stated Goal: Pt would like AMS to resolve, and to regain strength.  PT Goal Formulation: With patient Time For Goal Achievement: 03/03/15 Potential to Achieve Goals: Fair    Frequency Min 3X/week   Barriers to discharge Decreased caregiver support;Inaccessible home environment      Co-evaluation               End of Session   Activity Tolerance: Patient limited by lethargy;Patient limited by pain;Treatment limited secondary to medical complications (Comment) Patient left: in bed;with call bell/phone within reach Nurse Communication: Mobility status;Other (comment) (CP, PVCs , dry heaving. )         Time: [id number] PT Time Calculation (min) (ACUTE ONLY): 22 min   Charges:   PT Evaluation $PT Eval High Complexity: 1 Procedure PT Treatments $Therapeutic Activity:  8-22 mins   PT G Codes:        2:05 PM, 2015-03-02 Etta Grandchild, PT, DPT PRN Physical Therapist at [place] License # [id number] (wireless)  [phone] (mobile)

## 2015-02-17 NOTE — Progress Notes (Signed)
PT Cancellation Note  Patient Details Name: [name] MRN: [id number] DOB: 1955/04/18   Cancelled Treatment:    Reason Eval/Treat Not Completed: Patient not medically ready. Chart reviewed, RN consulted: medical record reveals several lab values to be outside of safe range for exertion/PT evaluation per policy: Hb < 8.0 (7.8), HCT < 25 (21.7), and Ca++<7.0 (5.9). In the setting of EF 25-30% and chronic diastolic heart failure, I am holding PT evaluation at this time until the patient is more medically appropriate for PT evalaution. I will continue to monitor and evaluate at later date/time as medically appropriate.     12:40 PM, 02/17/2015 Etta Grandchild, PT, DPT PRN Physical Therapist at [place] License # [id number] (wireless)  [phone] (mobile)

## 2015-02-17 NOTE — Care Management Note (Signed)
Case Management Note  Patient Details  Name: Devona Sneider MRN: [id number] Date of Birth: [birth year]  Expected Discharge Date:                  Expected Discharge Plan:  Assisted Living / Rest Home  In-House Referral:  Clinical Social Work  Discharge planning Services  CM Consult  Post Acute Care Choice:  Home Health Choice offered to:  Patient  DME Arranged:    DME Agency:     HH Arranged:  RN [place] Agency:  [employer]  Status of Service:  In process, will continue to follow  Medicare Important Message Given:    Date Medicare IM Given:    Medicare IM give by:    Date Additional Medicare IM Given:    Additional Medicare Important Message give by:     If discussed at [place] of Stay Meetings, dates discussed:    Additional Comments: Potential for DC back to group home over weekend. Pt may need home O2 at DC. If HH needed facility has recommended [facility] as agency preference. Will cont to follow.   [name], RN 02/17/2015, 2:04 PM

## 2015-02-18 LAB — CBC
HEMATOCRIT: 22.9 % — AB (ref 36.0–46.0)
HEMOGLOBIN: 8.1 g/dL — AB (ref 12.0–15.0)
MCH: 30.7 pg (ref 26.0–34.0)
MCHC: 35.4 g/dL (ref 30.0–36.0)
MCV: 86.7 fL (ref 78.0–100.0)
Platelets: 117 10*3/uL — ABNORMAL LOW (ref 150–400)
RBC: 2.64 MIL/uL — ABNORMAL LOW (ref 3.87–5.11)
RDW: 12.9 % (ref 11.5–15.5)
WBC: 5.5 10*3/uL (ref 4.0–10.5)

## 2015-02-18 LAB — BASIC METABOLIC PANEL
ANION GAP: 17 — AB (ref 5–15)
BUN: 59 mg/dL — ABNORMAL HIGH (ref 6–20)
CO2: 26 mmol/L (ref 22–32)
Calcium: 6.1 mg/dL — CL (ref 8.9–10.3)
Chloride: 96 mmol/L — ABNORMAL LOW (ref 101–111)
Creatinine, Ser: 6.06 mg/dL — ABNORMAL HIGH (ref 0.44–1.00)
GFR calc Af Amer: 8 mL/min — ABNORMAL LOW (ref >60)
GFR calc non Af Amer: 7 mL/min — ABNORMAL LOW (ref >60)
GLUCOSE: 75 mg/dL (ref 65–99)
POTASSIUM: 3.6 mmol/L (ref 3.5–5.1)
Sodium: 139 mmol/L (ref 135–145)

## 2015-02-18 LAB — PROTEIN, URINE, RANDOM: TOTAL PROTEIN, URINE: 67 mg/dL

## 2015-02-18 LAB — CREATININE, URINE, RANDOM: CREATININE, URINE: 33.24 mg/dL

## 2015-02-18 LAB — HEPARIN LEVEL (UNFRACTIONATED): Heparin Unfractionated: 0.7 IU/mL (ref 0.30–0.70)

## 2015-02-18 LAB — APTT: aPTT: 71 seconds — ABNORMAL HIGH (ref 24–37)

## 2015-02-18 LAB — SODIUM, URINE, RANDOM: SODIUM UR: 43 mmol/L

## 2015-02-18 MED ORDER — IPRATROPIUM BROMIDE 0.02 % IN SOLN
RESPIRATORY_TRACT | Status: AC
Start: 2015-02-18 — End: 2015-02-18
  Administered 2015-02-18: 0.5 mg
  Filled 2015-02-18: qty 2.5

## 2015-02-18 MED ORDER — ALBUTEROL SULFATE (2.5 MG/3ML) 0.083% IN NEBU
2.5000 mg | INHALATION_SOLUTION | RESPIRATORY_TRACT | Status: DC | PRN
  Administered 2015-02-19 – 2015-02-22 (×2): 2.5 mg via RESPIRATORY_TRACT
  Filled 2015-02-18 (×2): qty 3

## 2015-02-18 MED ORDER — ALBUTEROL SULFATE (2.5 MG/3ML) 0.083% IN NEBU
INHALATION_SOLUTION | RESPIRATORY_TRACT | Status: AC
  Administered 2015-02-18: 2.5 mg
  Filled 2015-02-18: qty 3

## 2015-02-18 NOTE — Progress Notes (Signed)
[facility] PROGRESS NOTE  [name] [id number] DOB: 07/26/1955 DOA: 02/14/2015 PCP: [name], MD  Assessment/Plan: Severe sepsis with hypotension -Was initially pressor dependent. -Fluid management as per nephrology. -The presumed source is urine.  -Will change abx to rocephin. Can switch to cipro at time of DC if abx still required.  Encephalopathy -Unsure of chronicity. Likely a component of acute encephalopathy given severe illness and sepsis. However she does reside in a group home, so I suspect she has some degree of mental dis-capacity. Will attempt to contact group home today for further information. -She is much more awake today. Asks me where Santiago Glad, her sister is.  E Coli UTI -Rocephin as above.  Acute renal failure on CKD Stage III -Baseline Cr appears to be around 1.2-1.3. -Suspect related to sepsis, hypotension as well as profound dehydration. -We will function continues to slowly improve, creatinine is down to 6 today.. -Renal ultrasound without signs of hydronephrosis or obstruction. -Lasix, Aldactone, entresto have been appropriately held. -Appreciate renal input and recommendations. -Metabolic acidosis has resolved.   Paroxysmal atrial fibrillation -Currently rate controlled, eliquis remains on hold given degree of renal dysfunction. -Continue IV heparin.  History of ischemic cardiomyopathy  -2-D echo from March 2016 shows an ejection fraction of 25-30% with diffuse hypokinesis and grade 2 diastolic dysfunction. - status post AICD. -Fluid status appears compensated.   Code Status: Full code Family Communication: Patient only  Disposition Plan: Transfer to floor. Start PT. Probable DC back to group home in 24-48 hours if renal function allows.   Consultants:  Cardiology  Nephrology   Antibiotics:  Rocephin  Subjective: More alert today.  Objective: Filed Vitals:   02/17/15 1619 02/17/15 2212 02/18/15 0530 02/18/15  0754  BP:  121/65 121/93   Pulse:  73 76   Temp: 96.8 F (36 C) 97.5 F (36.4 C) 97.5 F (36.4 C)   TempSrc: Oral Oral Oral   Resp:  16 14   Height:      Weight:      SpO2:  92% 98% 92%   No intake or output data in the 24 hours ending 02/18/15 1236 Filed Weights   02/15/15 0500 02/16/15 0500 02/17/15 0500  Weight: 58 kg (127 lb 13.9 oz) 64.4 kg (141 lb 15.6 oz) 65.2 kg (143 lb 11.8 oz)    Exam:   General:  Awake, appears confused  Cardiovascular: irregular  Respiratory: coarse bilateral BS  Abdomen: S/NT/ND/+BS  Extremities: no C/C/E   Neurologic:  Moves all 4 spontaneously  Data Reviewed: Basic Metabolic Panel:  Recent Labs Lab 02/15/15 0328 02/15/15 0953 02/16/15 0440 02/17/15 0300 02/18/15 0556  NA 136 137 140 143 139  K 4.8 4.8 3.9 3.2* 3.6  CL 110 113* 107 99* 96*  CO2 14* 14* 18* 28 26  GLUCOSE 142* 126* 78 64* 75  BUN 63* 62* 63* 65* 59*  CREATININE 6.51* 6.42* 6.39* 6.34* 6.06*  CALCIUM 5.8* 6.1* 5.9* 5.9* 6.1*  MG  --  1.6*  --   --   --   PHOS  --   --   --  5.0*  --    Liver Function Tests:  Recent Labs Lab 02/14/15 1430 02/15/15 0328 02/15/15 0953 02/17/15 0300  AST 22 22 26  35  ALT 14 14 15 17   ALKPHOS 88 75 84 76  BILITOT 0.6 0.5 0.5 1.1  PROT 7.2 5.4* 5.4* 4.8*  ALBUMIN 3.5 2.5* 2.5* 2.0*   No results for input(s): LIPASE,  AMYLASE in the last 168 hours. No results for input(s): AMMONIA in the last 168 hours. CBC:  Recent Labs Lab 02/14/15 1430 02/15/15 0328 02/16/15 0440 02/17/15 0300 02/18/15 0556  WBC 20.9* 18.2* 11.0* 6.2 5.5  HGB 10.4* 9.5* 8.7* 7.8* 8.1*  HCT 30.9* 28.3* 25.3* 21.7* 22.9*  MCV 89.8 90.7 87.2 85.8 86.7  PLT 153 173 172 123* 117*   Cardiac Enzymes:  Recent Labs Lab 02/14/15 1430  CKTOTAL 350*  TROPONINI 0.03   BNP (last 3 results)  Recent Labs  04/09/14 0038  BNP 1385.0*    ProBNP (last 3 results) No results for input(s): PROBNP in the last 8760 hours.  CBG:  Recent  Labs Lab 02/14/15 1438 02/17/15 1622  GLUCAP 99 73    Recent Results (from the past 240 hour(s))  MRSA PCR Screening     Status: None   Collection Time: 02/14/15  2:08 PM  Result Value Ref Range Status   MRSA by PCR NEGATIVE NEGATIVE Final    Comment:        The GeneXpert MRSA Assay (FDA approved for NASAL specimens only), is one component of a comprehensive MRSA colonization surveillance program. It is not intended to diagnose MRSA infection nor to guide or monitor treatment for MRSA infections.   Urine culture     Status: None   Collection Time: 02/14/15  2:10 PM  Result Value Ref Range Status   Specimen Description URINE, CATHETERIZED  Final   Special Requests NONE  Final   Culture   Final    >=100,000 COLONIES/mL ESCHERICHIA COLI Performed at [facility]    Report Status 02/17/2015 FINAL  Final   Organism ID, Bacteria ESCHERICHIA COLI  Final      Susceptibility   Escherichia coli - MIC*    AMPICILLIN >=32 RESISTANT Resistant     CEFAZOLIN <=4 SENSITIVE Sensitive     CEFTRIAXONE <=1 SENSITIVE Sensitive     CIPROFLOXACIN <=0.25 SENSITIVE Sensitive     GENTAMICIN <=1 SENSITIVE Sensitive     IMIPENEM <=0.25 SENSITIVE Sensitive     NITROFURANTOIN <=16 SENSITIVE Sensitive     TRIMETH/SULFA <=20 SENSITIVE Sensitive     AMPICILLIN/SULBACTAM 16 INTERMEDIATE Intermediate     PIP/TAZO <=4 SENSITIVE Sensitive     * >=100,000 COLONIES/mL ESCHERICHIA COLI  Blood Culture (routine x 2)     Status: None (Preliminary result)   Collection Time: 02/14/15  2:30 PM  Result Value Ref Range Status   Specimen Description BLOOD LEFT ANTECUBITAL DRAWN BY RN  Final   Special Requests BOTTLES DRAWN AEROBIC ONLY 6CC  Final   Culture NO GROWTH 4 DAYS  Final   Report Status PENDING  Incomplete  Blood Culture (routine x 2)     Status: None (Preliminary result)   Collection Time: 02/15/15  3:28 AM  Result Value Ref Range Status   Specimen Description BLOOD A-LINE DRAW DRAWN BY RN  JD  Final   Special Requests BOTTLES DRAWN AEROBIC AND ANAEROBIC 8CC EACH  Final   Culture NO GROWTH 3 DAYS  Final   Report Status PENDING  Incomplete     Studies: No results found.  Scheduled Meds: . calcium carbonate (dosed in mg elemental calcium)  1,000 mg of elemental calcium Oral TID  . cefTRIAXone (ROCEPHIN)  IV  1 g Intravenous Q24H  . ezetimibe  10 mg Oral Daily  . levothyroxine  25 mcg Oral QAC breakfast  . pantoprazole  40 mg Oral Daily  . rosuvastatin  40  mg Oral q1800  . sertraline  100 mg Oral Daily   Continuous Infusions: . 0.45 % NaCl with KCl 20 mEq / L 135 mL/hr at 02/18/15 1101  . heparin 350 Units/hr (02/17/15 0428)    Active Problems:   Hypertension   GERD (gastroesophageal reflux disease)   Chronic systolic CHF (congestive heart failure) (HCC)   H/O PAF   Cardiomyopathy, ischemic- EF 25-30% 2D 09/02/13   CAD- total LAD- residual OM2 disease   Suicide attempt Sept 2015   Hypothyroidism   S/P ICD (internal cardiac defibrillator) procedure, St. 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 Greater than 50% of this time was spent in direct contact with the patient coordinating care.    Lelon Frohlich  [facility] Pager [phone]  If 7PM-7AM, please contact night-coverage at [email or web address], password [email or web address] 02/18/2015, 12:36 PM  LOS: 4 days

## 2015-02-18 NOTE — Progress Notes (Signed)
Subjective: Patient complains of weakness. Presently her appetite is getting better and she doesn't have any nausea or vomiting.  Objective: Vital signs in last 24 hours: Temp:  [96.6 F (35.9 C)-97.5 F (36.4 C)] 97.5 F (36.4 C) (01/28 0530) Pulse Rate:  [66-86] 76 (01/28 0530) Resp:  [10-16] 14 (01/28 0530) BP: (116-121)/(65-93) 121/93 mmHg (01/28 0530) SpO2:  [88 %-100 %] 92 % (01/28 0754)  Intake/Output from previous day: 01/27 0701 - 01/28 0700 In: 1227 [I.V.:1227] Out: -  Intake/Output this shift:     Recent Labs  02/16/15 0440 02/17/15 0300 02/18/15 0556  HGB 8.7* 7.8* 8.1*    Recent Labs  02/17/15 0300 02/18/15 0556  WBC 6.2 5.5  RBC 2.53* 2.64*  HCT 21.7* 22.9*  PLT 123* 117*    Recent Labs  02/17/15 0300 02/18/15 0556  NA 143 139  K 3.2* 3.6  CL 99* 96*  CO2 28 26  BUN 65* 59*  CREATININE 6.34* 6.06*  GLUCOSE 64* 75  CALCIUM 5.9* 6.1*    Recent Labs  02/15/15 0953  INR 2.38*    Generally patient is alert and in no apparent distress Chest: She is clear Heart exam regular rate and rhythm no murmur Abdomen: Positive bowel sounds Extremities no edema  Assessment/Plan: Problem #1 acute kidney injury superimposed on chronic. Etiology was thought to be secondary to combination of ATN/prerenal/RAAS. Her renal function continued to improve. Presently patient is non-oliguric.  Problem #2 metabolic acidosis: Has corrected. Problem #3 history of ischemic cardiomyopathy with low ejection fraction. She is also status post AICD placement. Presently patient doesn't have any sign of fluid overload and she had about 1400 mL of urine output the last 24 hours. Problem #4 anemia: Her hemoglobin is low but stable. Problem #5 history of hypertension: Her blood pressure is presently was in acceptable range however she has episode of hypotension. Problem #6 history of chronic renal failure. Patient creatinine was 1.26 with a GFR of 46 mL/m about 4 weeks ago  on 01/18/2015. That's his stage III chronic renal failure. Ultrasound of the kidneys showed normal kidney and there is no hydronephrosis. Problem #7 hypocalcemia: Patient is on calcium supplement and her calcium is improving. Her phosphorus is 5. Problem#8 Hypokalmia: Potassium is normal Plan: 1] we'll continue his present management 2] we'll check compressive metabolic panel in am   [facility] S 02/18/2015, 8:23 AM

## 2015-02-18 NOTE — Progress Notes (Signed)
[place] for Heparin Indication: atrial fibrillation  Allergies  Allergen Reactions  . Morphine And Related Other (See Comments)    Dizziness and hallucinations  . Penicillins Anaphylaxis and Rash    Has patient had a PCN reaction causing immediate rash, facial/tongue/throat swelling, SOB or lightheadedness with hypotension:unknown Has patient had a PCN reaction causing severe rash involving mucus membranes or skin necrosis: unknown Has patient had a PCN reaction that required hospitalization unknown Has patient had a PCN reaction occurring within the last 10 years: unknown If all of the above answers are "NO", then may proceed with Cephalosporin use.    Patient Measurements: Height: 5\' 4"  (162.6 cm) Weight: 143 lb 11.8 oz (65.2 kg) IBW/kg (Calculated) : 54.7  Vital Signs: Temp: 97.5 F (36.4 C) (01/28 0530) Temp Source: Oral (01/28 0530) BP: 121/93 mmHg (01/28 0530) Pulse Rate: 76 (01/28 0530)  Labs:  Recent Labs  02/15/15 0953  02/16/15 0440  02/17/15 0300 02/17/15 1151 02/18/15 0556  HGB  --   < > 8.7*  --  7.8*  --  8.1*  HCT  --   --  25.3*  --  21.7*  --  22.9*  PLT  --   --  172  --  123*  --  117*  APTT  --   < >  --   < > 125* 79* 71*  LABPROT 25.7*  --   --   --   --   --   --   INR 2.38*  --   --   --   --   --   --   HEPARINUNFRC  --   < >  --   < > >2.20* 1.88* 0.70  CREATININE 6.42*  --  6.39*  --  6.34*  --  6.06*  < > = values in this interval not displayed.  Estimated Creatinine Clearance: 8.5 mL/min (by C-G formula based on Cr of 6.06).  Medical History: Past Medical History  Diagnosis Date  . Diverticulosis   . Pancreatitis 1980, 2015    in the setting of ETOH  . chronic systolic heart failure     a. ECHO (05/08/13): EF 20-25%, diff HK with akinesis of basal inferior wall, grade 1 DD, trivial MR, trivial TR b) RHC (05/06/13): RA 7, RV 30/2, PA 31/19 (24), PCWP 10, Fick CO/CI: 2.9/1.74, Thermo CO/Ci:  2.4/1.4, PA sat 53%  . Ischemic cardiomyopathy   . CAD (coronary artery disease)     a. LHC (04/2013): Chronically occluded LAD, moderate dz in large OM1 and severe dz and small posterior lateral vessel    . Persistent atrial fibrillation ([place]) 2015  . Suicidal overdose ([place]) 10/03/13    Overdoes on Beta Blockers & Xarelto [facility])  . Anxiety 2015   . Psoriasis 1968  . GERD (gastroesophageal reflux disease) 2015   . Hyperlipidemia 2015  . Hypertension 2015  . ETOH abuse 1981    started abusing alcohol at age 60, quit   . Hypothyroidism   . PONV (postoperative nausea and vomiting)   . Mass of right breast on mammogram 07/14/2014  . AICD (automatic cardioverter/defibrillator) present   . Myocardial infarction ([place])     "I've had many" (09/13/2014)  . Anginal pain ([place])   . Anemia   . History of blood transfusion     "related to losing blood from somewhere; never found out from where"  . Stroke [facility]) 2011    left side  weakness, minimal  . Depression   . 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/2014   Medications:  Prescriptions prior to admission  Medication Sig Dispense Refill Last Dose  . apixaban (ELIQUIS) 5 MG TABS tablet Take 1 tablet (5 mg total) by mouth 2 (two) times daily. 60 tablet 3 Past Week at Unknown time  . carvedilol (COREG) 12.5 MG tablet Take 1 tablet (12.5 mg total) by mouth 2 (two) times daily. 60 tablet 6 Past Week at Unknown time  . ezetimibe (ZETIA) 10 MG tablet Take 1 tablet (10 mg total) by mouth daily. 90 tablet 3 Past Week at Unknown time  . furosemide (LASIX) 20 MG tablet Take 1 tablet (20 mg total) by mouth every other day. 15 tablet 6 Past Week at Unknown time  . levothyroxine (SYNTHROID, LEVOTHROID) 25 MCG tablet Take 25 mcg by mouth daily before breakfast.    Past Week at Unknown time  . LORazepam (ATIVAN) 0.5 MG tablet Take 1 tablet (0.5 mg total) by mouth 2 (two) times daily. 60 tablet 2 Past Week at Unknown time  .  pantoprazole (PROTONIX) 40 MG tablet TAKE 1 TABLET BY MOUTH TWICE DAILY BEFORE A MEAL. 60 tablet 5 Past Week at Unknown time  . rosuvastatin (CRESTOR) 40 MG tablet Take 1 tablet (40 mg total) by mouth daily. D/C order for Atorvastatin 30 tablet 3 Past Week at Unknown time  . sacubitril-valsartan (ENTRESTO) 24-26 MG Take 1 tablet by mouth 2 (two) times daily. 60 tablet 5 Past Week at Unknown time  . sertraline (ZOLOFT) 50 MG tablet Take 100 mg by mouth daily.   Past Week at Unknown time  . spironolactone (ALDACTONE) 25 MG tablet Take 0.5 tablets (12.5 mg total) by mouth daily. 15 tablet 3 Past Week at Unknown time  . acetaminophen (TYLENOL) 325 MG tablet Take 650 mg by mouth every 6 (six) hours as needed for mild pain.   Unknown at Unknown time  . ammonium lactate (AMLACTIN) 12 % cream Apply 1 g topically daily as needed for dry skin (apply to elbows).    Unknown at Unknown time  . nitroGLYCERIN (NITROSTAT) 0.3 MG SL tablet Place 0.3 mg under the tongue every 5 (five) minutes as needed for chest pain (MAX 3 TABLETS). Reported on 01/18/2015   Unknown at Unknown time  . ondansetron (ZOFRAN) 4 MG tablet Take 4 mg by mouth every 8 (eight) hours as needed for nausea or vomiting.   Unknown at Unknown time  . zolpidem (AMBIEN) 10 MG tablet Take 1 tablet (10 mg total) by mouth at bedtime as needed for sleep. 30 tablet 0 Unknown at Unknown time   Assessment: 60yo female with h/o afib, pt was on Eliquis PTA.  Started heparin therapy. Admitted with ARF.  Heparin level is now therapeutic (Eliquis effect has dissipated)  aPTT is therapeutic.  No bleeding reported.  H/H OK, PLTs trending down.  Goal of Therapy:  APTT 66-102 Monitor platelets by anticoagulation protocol: Yes   Plan:  Continue heparin infusion at 350 units/hr Heparin level and aPTT daily CBC daily while on Heparin  Thanks for allowing pharmacy to be a part of this patient's care.  Hart Robinsons, PharmD Clinical Pharmacist 02/18/2015,7:59  AM

## 2015-02-18 NOTE — Progress Notes (Signed)
Patient has long history of Smoking, ETOH  abuse and some anxiety. She stated she could not breath, saturation 93 on 2 lpm / [place] .  She did have wheezes on rt side and congested cough. Kidney function is poor with HX of CHF. Oxygen increased to 4 lpm and given Albuterol / atrovent neb treatment. Nebs have been order prn Q2.

## 2015-02-19 ENCOUNTER — Inpatient Hospital Stay (HOSPITAL_COMMUNITY): Payer: Medicaid Other

## 2015-02-19 LAB — BASIC METABOLIC PANEL
Anion gap: 16 — ABNORMAL HIGH (ref 5–15)
BUN: 55 mg/dL — ABNORMAL HIGH (ref 6–20)
CHLORIDE: 96 mmol/L — AB (ref 101–111)
CO2: 23 mmol/L (ref 22–32)
CREATININE: 5.38 mg/dL — AB (ref 0.44–1.00)
Calcium: 7.4 mg/dL — ABNORMAL LOW (ref 8.9–10.3)
GFR calc non Af Amer: 8 mL/min — ABNORMAL LOW (ref >60)
GFR, EST AFRICAN AMERICAN: 9 mL/min — AB (ref >60)
Glucose, Bld: 98 mg/dL (ref 65–99)
Potassium: 5.3 mmol/L — ABNORMAL HIGH (ref 3.5–5.1)
Sodium: 135 mmol/L (ref 135–145)

## 2015-02-19 LAB — PROCALCITONIN: Procalcitonin: 1.11 ng/mL

## 2015-02-19 LAB — CBC
HEMATOCRIT: 24.1 % — AB (ref 36.0–46.0)
HEMOGLOBIN: 8.5 g/dL — AB (ref 12.0–15.0)
MCH: 30.7 pg (ref 26.0–34.0)
MCHC: 35.3 g/dL (ref 30.0–36.0)
MCV: 87 fL (ref 78.0–100.0)
Platelets: 110 10*3/uL — ABNORMAL LOW (ref 150–400)
RBC: 2.77 MIL/uL — ABNORMAL LOW (ref 3.87–5.11)
RDW: 12.8 % (ref 11.5–15.5)
WBC: 5.9 10*3/uL (ref 4.0–10.5)

## 2015-02-19 LAB — APTT
aPTT: 59 seconds — ABNORMAL HIGH (ref 24–37)
aPTT: 68 seconds — ABNORMAL HIGH (ref 24–37)

## 2015-02-19 LAB — HEPARIN LEVEL (UNFRACTIONATED): HEPARIN UNFRACTIONATED: 0.91 [IU]/mL — AB (ref 0.30–0.70)

## 2015-02-19 MED ORDER — PROMETHAZINE HCL 25 MG/ML IJ SOLN
12.5000 mg | Freq: Four times a day (QID) | INTRAMUSCULAR | Status: DC | PRN
  Administered 2015-02-19 – 2015-02-20 (×2): 12.5 mg via INTRAVENOUS
  Filled 2015-02-19 (×2): qty 1

## 2015-02-19 NOTE — Progress Notes (Signed)
Subjective: Patient complains of not feeling good. She has some nausea but no vomiting. Complains of weakness and some pain.  Objective: Vital signs in last 24 hours: Temp:  [97 F (36.1 C)-97.5 F (36.4 C)] 97.5 F (36.4 C) (01/29 0528) Pulse Rate:  [73-93] 81 (01/29 0528) Resp:  [16-17] 16 (01/29 0528) BP: (121-136)/(85-94) 124/88 mmHg (01/29 0528) SpO2:  [91 %-94 %] 92 % (01/29 0528)  Intake/Output from previous day: 01/28 0701 - 01/29 0700 In: 3546.3 [P.O.:960; I.V.:2586.3] Out: -  Intake/Output this shift:     Recent Labs  02/17/15 0300 02/18/15 0556 02/19/15 0451  HGB 7.8* 8.1* 8.5*    Recent Labs  02/18/15 0556 02/19/15 0451  WBC 5.5 5.9  RBC 2.64* 2.77*  HCT 22.9* 24.1*  PLT 117* 110*    Recent Labs  02/17/15 0300 02/18/15 0556  NA 143 139  K 3.2* 3.6  CL 99* 96*  CO2 28 26  BUN 65* 59*  CREATININE 6.34* 6.06*  GLUCOSE 64* 75  CALCIUM 5.9* 6.1*   No results for input(s): LABPT, INR in the last 72 hours.  Generally patient is alert and in no apparent distress Chest: She is clear Heart exam regular rate and rhythm no murmur Abdomen: Positive bowel sounds Extremities no edema  Assessment/Plan: Problem #1 acute kidney injury superimposed on chronic. Etiology was thought to be secondary to combination of ATN/prerenal/RAAS. Her renal function continue to improve and is none oliguric Problem #2 metabolic acidosis: Has corrected. Problem #3 history of ischemic cardiomyopathy with low ejection fraction. She is also status post AICD placement. Patient states that she is making still more urine but is not documented. Her weight seems to be stable. Problem #4 anemia: Her hemoglobin is low but stable. Problem #5 history of hypertension: Her blood pressure is presently was in acceptable range however she has episode of hypotension. Problem #6 history of chronic renal failure. Patient creatinine was 1.26 with a GFR of 46 mL/m about 4 weeks ago on  01/18/2015. That's his stage III chronic renal failure Problem #7 hypocalcemia: Patient is on calcium supplement . Her calcium is improving Problem#8 Hypokalmia: Potassium is normal Plan: 1] we'll continue his present management 2] we'll check compressive metabolic panel today and tomorrow.   Netta Fodge S 02/19/2015, 9:08 AM

## 2015-02-19 NOTE — Progress Notes (Signed)
[facility] PROGRESS NOTE  [name] [id number] DOB: 11/20/1955 DOA: 02/14/2015 PCP: [name], MD  Assessment/Plan:  Acute Hypoxemic Respiratory Failure -Believe related to fluid overload. -Will hold IVF and give one time dose of lasix 40 mg.  Severe sepsis with hypotension -Was initially pressor dependent. -The presumed source is urine.  -Continue rocephin. Can switch to cipro at time of DC if abx still required.  Encephalopathy -Unsure of chronicity. Likely a component of acute encephalopathy given severe illness and sepsis. However she does reside in a group home, so I suspect she has some degree of mental dis-capacity. Will attempt to contact group home today for further information. -She is much more awake today.  E Coli UTI -Rocephin as above.  Acute renal failure on CKD Stage III -Baseline Cr appears to be around 1.2-1.3. -Suspect related to sepsis, hypotension as well as profound dehydration. -We will function continues to slowly improve, creatinine is down to 6 today.. -Renal ultrasound without signs of hydronephrosis or obstruction. -Lasix, Aldactone, entresto have been appropriately held. -Appreciate renal input and recommendations. -Metabolic acidosis has resolved.  -Watch renal function closely as I am holding IVF and giving a dose of lasix today.  Paroxysmal atrial fibrillation -Currently rate controlled, eliquis remains on hold given degree of renal dysfunction. -Continue IV heparin.  History of ischemic cardiomyopathy  -2-D echo from March 2016 shows an ejection fraction of 25-30% with diffuse hypokinesis and grade 2 diastolic dysfunction. - status post AICD. -Fluid status appears compensated.   Code Status: Full code Family Communication: Patient only  Disposition Plan: group home vs pending pending medical stability  Consultants:  Cardiology  Nephrology   Antibiotics:  Rocephin  Subjective: More alert today. Had  some SOB this am with los O2 sats.  Objective: Filed Vitals:   02/18/15 2052 02/19/15 0325 02/19/15 0528 02/19/15 1031  BP:   124/88   Pulse:   81   Temp:   97.5 F (36.4 C)   TempSrc:   Oral   Resp:   16   Height:      Weight:      SpO2: 94% 93% 92% 95%    Intake/Output Summary (Last 24 hours) at 02/19/15 1211 Last data filed at 02/19/15 0600  Gross per 24 hour  Intake 3306.25 ml  Output      0 ml  Net 3306.25 ml   Filed Weights   02/15/15 0500 02/16/15 0500 02/17/15 0500  Weight: 58 kg (127 lb 13.9 oz) 64.4 kg (141 lb 15.6 oz) 65.2 kg (143 lb 11.8 oz)    Exam:   General:  Awake, alert, able to answer questions appropriately.  Cardiovascular: irregular  Respiratory: coarse bilateral BS  Abdomen: S/NT/ND/+BS  Extremities: no C/C/E   Neurologic:  Moves all 4 spontaneously  Data Reviewed: Basic Metabolic Panel:  Recent Labs Lab 02/15/15 0953 02/16/15 0440 02/17/15 0300 02/18/15 0556 02/19/15 0939  NA 137 140 143 139 135  K 4.8 3.9 3.2* 3.6 5.3*  CL 113* 107 99* 96* 96*  CO2 14* 18* 28 26 23   GLUCOSE 126* 78 64* 75 98  BUN 62* 63* 65* 59* 55*  CREATININE 6.42* 6.39* 6.34* 6.06* 5.38*  CALCIUM 6.1* 5.9* 5.9* 6.1* 7.4*  MG 1.6*  --   --   --   --   PHOS  --   --  5.0*  --   --    Liver Function Tests:  Recent Labs Lab 02/14/15 1430 02/15/15 0328 02/15/15  [id number] 02/17/15 0300  AST 22 22 26  35  ALT 14 14 15 17   ALKPHOS 88 75 84 76  BILITOT 0.6 0.5 0.5 1.1  PROT 7.2 5.4* 5.4* 4.8*  ALBUMIN 3.5 2.5* 2.5* 2.0*   No results for input(s): LIPASE, AMYLASE in the last 168 hours. No results for input(s): AMMONIA in the last 168 hours. CBC:  Recent Labs Lab 02/15/15 0328 02/16/15 0440 02/17/15 0300 02/18/15 0556 02/19/15 0451  WBC 18.2* 11.0* 6.2 5.5 5.9  HGB 9.5* 8.7* 7.8* 8.1* 8.5*  HCT 28.3* 25.3* 21.7* 22.9* 24.1*  MCV 90.7 87.2 85.8 86.7 87.0  PLT 173 172 123* 117* 110*   Cardiac Enzymes:  Recent Labs Lab 02/14/15 1430  CKTOTAL  350*  TROPONINI 0.03   BNP (last 3 results)  Recent Labs  04/09/14 0038  BNP 1385.0*    ProBNP (last 3 results) No results for input(s): PROBNP in the last 8760 hours.  CBG:  Recent Labs Lab 02/14/15 1438 02/17/15 1622  GLUCAP 99 73    Recent Results (from the past 240 hour(s))  MRSA PCR Screening     Status: None   Collection Time: 02/14/15  2:08 PM  Result Value Ref Range Status   MRSA by PCR NEGATIVE NEGATIVE Final    Comment:        The GeneXpert MRSA Assay (FDA approved for NASAL specimens only), is one component of a comprehensive MRSA colonization surveillance program. It is not intended to diagnose MRSA infection nor to guide or monitor treatment for MRSA infections.   Urine culture     Status: None   Collection Time: 02/14/15  2:10 PM  Result Value Ref Range Status   Specimen Description URINE, CATHETERIZED  Final   Special Requests NONE  Final   Culture   Final    >=100,000 COLONIES/mL ESCHERICHIA COLI Performed at [facility]    Report Status 02/17/2015 FINAL  Final   Organism ID, Bacteria ESCHERICHIA COLI  Final      Susceptibility   Escherichia coli - MIC*    AMPICILLIN >=32 RESISTANT Resistant     CEFAZOLIN <=4 SENSITIVE Sensitive     CEFTRIAXONE <=1 SENSITIVE Sensitive     CIPROFLOXACIN <=0.25 SENSITIVE Sensitive     GENTAMICIN <=1 SENSITIVE Sensitive     IMIPENEM <=0.25 SENSITIVE Sensitive     NITROFURANTOIN <=16 SENSITIVE Sensitive     TRIMETH/SULFA <=20 SENSITIVE Sensitive     AMPICILLIN/SULBACTAM 16 INTERMEDIATE Intermediate     PIP/TAZO <=4 SENSITIVE Sensitive     * >=100,000 COLONIES/mL ESCHERICHIA COLI  Blood Culture (routine x 2)     Status: None (Preliminary result)   Collection Time: 02/14/15  2:30 PM  Result Value Ref Range Status   Specimen Description BLOOD LEFT ANTECUBITAL DRAWN BY RN  Final   Special Requests BOTTLES DRAWN AEROBIC ONLY 6CC  Final   Culture NO GROWTH 4 DAYS  Final   Report Status PENDING   Incomplete  Blood Culture (routine x 2)     Status: None (Preliminary result)   Collection Time: 02/15/15  3:28 AM  Result Value Ref Range Status   Specimen Description BLOOD A-LINE DRAW DRAWN BY RN JD  Final   Special Requests BOTTLES DRAWN AEROBIC AND ANAEROBIC 8CC EACH  Final   Culture NO GROWTH 3 DAYS  Final   Report Status PENDING  Incomplete     Studies: Dg Chest Port 1v Same Day  02/19/2015  CLINICAL DATA:  Shortness of Breath EXAM: PORTABLE  CHEST 1 VIEW COMPARISON:  02/14/2015 FINDINGS: Right jugular catheter is again identified at the cavoatrial junction. A defibrillator is again seen and stable. Cardiac shadow is stable. Increasing left retrocardiac density is noted with associated effusion. Mild atelectatic changes are noted in the right base. IMPRESSION: Increasing left retrocardiac infiltrate with associated small effusion. Mild right basilar density is noted as well. Electronically Signed   By: Inez Catalina M.D.   On: 02/19/2015 11:43    Scheduled Meds: . calcium carbonate (dosed in mg elemental calcium)  1,000 mg of elemental calcium Oral TID  . cefTRIAXone (ROCEPHIN)  IV  1 g Intravenous Q24H  . ezetimibe  10 mg Oral Daily  . levothyroxine  25 mcg Oral QAC breakfast  . pantoprazole  40 mg Oral Daily  . rosuvastatin  40 mg Oral q1800  . sertraline  100 mg Oral Daily   Continuous Infusions: . heparin 450 Units/hr (02/19/15 1000)    Active Problems:   Hypertension   GERD (gastroesophageal reflux disease)   Chronic systolic CHF (congestive heart failure) ([place])   H/O PAF   Cardiomyopathy, ischemic- EF 25-30% 2D 09/02/13   CAD- total LAD- residual OM2 disease   Suicide attempt Sept 2015   Hypothyroidism   S/P ICD (internal cardiac defibrillator) procedure, St. 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 Greater than 50% of this time was spent in  direct contact with the patient coordinating care.    Lelon Frohlich  [facility] Pager [phone]  If 7PM-7AM, please contact night-coverage at [email or web address], password [facility] 02/19/2015, 12:11 PM  LOS: 5 days

## 2015-02-19 NOTE — Progress Notes (Addendum)
[place] for Heparin Indication: atrial fibrillation  Allergies  Allergen Reactions  . Morphine And Related Other (See Comments)    Dizziness and hallucinations  . Penicillins Anaphylaxis and Rash    Has patient had a PCN reaction causing immediate rash, facial/tongue/throat swelling, SOB or lightheadedness with hypotension:unknown Has patient had a PCN reaction causing severe rash involving mucus membranes or skin necrosis: unknown Has patient had a PCN reaction that required hospitalization unknown Has patient had a PCN reaction occurring within the last 10 years: unknown If all of the above answers are "NO", then may proceed with Cephalosporin use.    Patient Measurements: Height: 5\' 4"  (162.6 cm) Weight: 143 lb 11.8 oz (65.2 kg) IBW/kg (Calculated) : 54.7  Vital Signs: Temp: 97.5 F (36.4 C) (01/29 0528) Temp Source: Oral (01/29 0528) BP: 124/88 mmHg (01/29 0528) Pulse Rate: 81 (01/29 0528)  Labs:  Recent Labs  02/17/15 0300 02/17/15 1151 02/18/15 0556 02/19/15 0451  HGB 7.8*  --  8.1* 8.5*  HCT 21.7*  --  22.9* 24.1*  PLT 123*  --  117* 110*  APTT 125* 79* 71* 59*  HEPARINUNFRC >2.20* 1.88* 0.70 0.91*  CREATININE 6.34*  --  6.06*  --     Estimated Creatinine Clearance: 8.5 mL/min (by C-G formula based on Cr of 6.06).  Medical History: Past Medical History  Diagnosis Date  . Diverticulosis   . Pancreatitis 1980, 2015    in the setting of ETOH  . chronic systolic heart failure     a. ECHO (05/08/13): EF 20-25%, diff HK with akinesis of basal inferior wall, grade 1 DD, trivial MR, trivial TR b) RHC (05/06/13): RA 7, RV 30/2, PA 31/19 (24), PCWP 10, Fick CO/CI: 2.9/1.74, Thermo CO/Ci: 2.4/1.4, PA sat 53%  . Ischemic cardiomyopathy   . CAD (coronary artery disease)     a. LHC (04/2013): Chronically occluded LAD, moderate dz in large OM1 and severe dz and small posterior lateral vessel    . Persistent atrial fibrillation  ([place]) 2015  . Suicidal overdose ([place]) 10/03/13    [place] on Beta Blockers & Xarelto [facility])  . Anxiety 2015   . Psoriasis 1968  . GERD (gastroesophageal reflux disease) 2015   . Hyperlipidemia 2015  . Hypertension 2015  . ETOH abuse 1981    started abusing alcohol at age 89, quit   . Hypothyroidism   . PONV (postoperative nausea and vomiting)   . Mass of right breast on mammogram 07/14/2014  . AICD (automatic cardioverter/defibrillator) present   . Myocardial infarction ([place])     "I've had many" (09/13/2014)  . Anginal pain ([place])   . Anemia   . History of blood transfusion     "related to losing blood from somewhere; never found out from where"  . Stroke [facility]) 2011    left side weakness, minimal  . Depression   . 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/2014   Medications:  Prescriptions prior to admission  Medication Sig Dispense Refill Last Dose  . apixaban (ELIQUIS) 5 MG TABS tablet Take 1 tablet (5 mg total) by mouth 2 (two) times daily. 60 tablet 3 Past Week at Unknown time  . carvedilol (COREG) 12.5 MG tablet Take 1 tablet (12.5 mg total) by mouth 2 (two) times daily. 60 tablet 6 Past Week at Unknown time  . ezetimibe (ZETIA) 10 MG tablet Take 1 tablet (10 mg total) by mouth daily. 90 tablet 3 Past  Week at Unknown time  . furosemide (LASIX) 20 MG tablet Take 1 tablet (20 mg total) by mouth every other day. 15 tablet 6 Past Week at Unknown time  . levothyroxine (SYNTHROID, LEVOTHROID) 25 MCG tablet Take 25 mcg by mouth daily before breakfast.    Past Week at Unknown time  . LORazepam (ATIVAN) 0.5 MG tablet Take 1 tablet (0.5 mg total) by mouth 2 (two) times daily. 60 tablet 2 Past Week at Unknown time  . pantoprazole (PROTONIX) 40 MG tablet TAKE 1 TABLET BY MOUTH TWICE DAILY BEFORE A MEAL. 60 tablet 5 Past Week at Unknown time  . rosuvastatin (CRESTOR) 40 MG tablet Take 1 tablet (40 mg total) by mouth daily. D/C order for Atorvastatin 30 tablet  3 Past Week at Unknown time  . sacubitril-valsartan (ENTRESTO) 24-26 MG Take 1 tablet by mouth 2 (two) times daily. 60 tablet 5 Past Week at Unknown time  . sertraline (ZOLOFT) 50 MG tablet Take 100 mg by mouth daily.   Past Week at Unknown time  . spironolactone (ALDACTONE) 25 MG tablet Take 0.5 tablets (12.5 mg total) by mouth daily. 15 tablet 3 Past Week at Unknown time  . acetaminophen (TYLENOL) 325 MG tablet Take 650 mg by mouth every 6 (six) hours as needed for mild pain.   Unknown at Unknown time  . ammonium lactate (AMLACTIN) 12 % cream Apply 1 g topically daily as needed for dry skin (apply to elbows).    Unknown at Unknown time  . nitroGLYCERIN (NITROSTAT) 0.3 MG SL tablet Place 0.3 mg under the tongue every 5 (five) minutes as needed for chest pain (MAX 3 TABLETS). Reported on 01/18/2015   Unknown at Unknown time  . ondansetron (ZOFRAN) 4 MG tablet Take 4 mg by mouth every 8 (eight) hours as needed for nausea or vomiting.   Unknown at Unknown time  . zolpidem (AMBIEN) 10 MG tablet Take 1 tablet (10 mg total) by mouth at bedtime as needed for sleep. 30 tablet 0 Unknown at Unknown time   Assessment: 60yo female with h/o afib, pt was on Eliquis PTA.  Started heparin therapy. Admitted with ARF.  Heparin level is now therapeutic on high end of range (Eliquis effect is dissipating but not completely eliminated)  aPTT is therapeutic on low end.  Heparin level is on high end of range.  No bleeding reported.  H/H OK, PLTs stable.    Goal of Therapy:  APTT 60-102 or Heparin level 0.3-0.7 Monitor platelets by anticoagulation protocol: Yes   Plan:  Increase infusion to 450 units/hr (based on low aPTT) Recheck aPTT in 6 hrs. aPTT daily CBC daily while on Heparin  Thanks for allowing pharmacy to be a part of this patient's care.  Hart Robinsons, PharmD Clinical Pharmacist 02/19/2015,10:01 AM

## 2015-02-20 ENCOUNTER — Encounter (HOSPITAL_COMMUNITY): Payer: Self-pay | Admitting: Primary Care

## 2015-02-20 DIAGNOSIS — Z7189 Other specified counseling: Secondary | ICD-10-CM | POA: Diagnosis not present

## 2015-02-20 DIAGNOSIS — I5023 Acute on chronic systolic (congestive) heart failure: Secondary | ICD-10-CM

## 2015-02-20 DIAGNOSIS — N189 Chronic kidney disease, unspecified: Secondary | ICD-10-CM

## 2015-02-20 DIAGNOSIS — A419 Sepsis, unspecified organism: Secondary | ICD-10-CM

## 2015-02-20 DIAGNOSIS — Z515 Encounter for palliative care: Secondary | ICD-10-CM | POA: Diagnosis not present

## 2015-02-20 LAB — CULTURE, BLOOD (ROUTINE X 2)
CULTURE: NO GROWTH
Culture: NO GROWTH

## 2015-02-20 LAB — CBC
HEMATOCRIT: 23.3 % — AB (ref 36.0–46.0)
HEMOGLOBIN: 8 g/dL — AB (ref 12.0–15.0)
MCH: 30.5 pg (ref 26.0–34.0)
MCHC: 34.3 g/dL (ref 30.0–36.0)
MCV: 88.9 fL (ref 78.0–100.0)
Platelets: 123 10*3/uL — ABNORMAL LOW (ref 150–400)
RBC: 2.62 MIL/uL — ABNORMAL LOW (ref 3.87–5.11)
RDW: 13.1 % (ref 11.5–15.5)
WBC: 7.6 10*3/uL (ref 4.0–10.5)

## 2015-02-20 LAB — BASIC METABOLIC PANEL
ANION GAP: 17 — AB (ref 5–15)
BUN: 58 mg/dL — ABNORMAL HIGH (ref 6–20)
CALCIUM: 7.8 mg/dL — AB (ref 8.9–10.3)
CO2: 24 mmol/L (ref 22–32)
Chloride: 98 mmol/L — ABNORMAL LOW (ref 101–111)
Creatinine, Ser: 5.57 mg/dL — ABNORMAL HIGH (ref 0.44–1.00)
GFR calc Af Amer: 9 mL/min — ABNORMAL LOW (ref >60)
GFR, EST NON AFRICAN AMERICAN: 8 mL/min — AB (ref >60)
GLUCOSE: 93 mg/dL (ref 65–99)
POTASSIUM: 4.3 mmol/L (ref 3.5–5.1)
Sodium: 139 mmol/L (ref 135–145)

## 2015-02-20 LAB — HEPARIN LEVEL (UNFRACTIONATED): Heparin Unfractionated: 0.55 IU/mL (ref 0.30–0.70)

## 2015-02-20 LAB — APTT: APTT: 63 s — AB (ref 24–37)

## 2015-02-20 MED ORDER — FUROSEMIDE 10 MG/ML IJ SOLN
40.0000 mg | Freq: Once | INTRAMUSCULAR | Status: AC
  Administered 2015-02-20: 40 mg via INTRAVENOUS
  Filled 2015-02-20: qty 4

## 2015-02-20 MED ORDER — DEXTROSE 5 % IV SOLN
100.0000 mg | Freq: Two times a day (BID) | INTRAVENOUS | Status: DC
  Administered 2015-02-20 – 2015-02-22 (×6): 100 mg via INTRAVENOUS
  Administered 2015-02-23: 10:00:00 via INTRAVENOUS
  Administered 2015-02-23: 100 mg via INTRAVENOUS
  Filled 2015-02-20 (×7): qty 10

## 2015-02-20 MED ORDER — CALCIUM CARBONATE 1250 MG/5ML PO SUSP
1000.0000 mg | Freq: Once | ORAL | Status: AC
  Administered 2015-02-20: 200 mg via ORAL
  Filled 2015-02-20: qty 10

## 2015-02-20 NOTE — Progress Notes (Signed)
[facility] PROGRESS NOTE  [name] [id number] DOB: 05-29-1955 DOA: 02/14/2015 PCP: [name], MD  Assessment/Plan:  Acute Hypoxemic Respiratory Failure -Believe related to fluid overload. -Lasix dose has been increased to 100 mg BID per renal recommendations.  Severe sepsis with hypotension -Was initially pressor dependent. -The presumed source is urine.  -Continue rocephin. Can switch to cipro at time of DC if abx still required.  Encephalopathy -Resolved.  E Coli UTI -Rocephin as above.  Acute renal failure on CKD Stage III -Baseline Cr appears to be around 1.2-1.3. -Suspect related to sepsis, hypotension as well as profound dehydration. -Renal ultrasound without signs of hydronephrosis or obstruction. -Lasix, Aldactone, entresto have been appropriately held. -Appreciate renal input and recommendations. -Concern for need of HD if she does not continue to improve. -Metabolic acidosis has resolved.   Paroxysmal atrial fibrillation -Currently rate controlled, eliquis remains on hold given degree of renal dysfunction. -Continue IV heparin.  History of ischemic cardiomyopathy  -2-D echo from March 2016 shows an ejection fraction of 25-30% with diffuse hypokinesis and grade 2 diastolic dysfunction. - status post AICD. -Continue high dose lasix and strive for negative fluid balance.  Code Status: Full code Family Communication: Patient only  Disposition Plan: to be determined. Given her overall frailty and nearing ESRD, will request palliative care consultation to assist with [place].  Consultants:  Cardiology  Nephrology   Antibiotics:  Rocephin  Subjective: Concerned about starting HD.  Objective: Filed Vitals:   02/19/15 2008 02/19/15 2155 02/20/15 0550 02/20/15 0732  BP:  128/93 119/79   Pulse:  104 78 100  Temp:  98.2 F (36.8 C) 97.9 F (36.6 C)   TempSrc:  Oral Oral   Resp:  20 22 24   Height:      Weight:    68.7 kg (151 lb 7.3 oz)   SpO2: 97% 96% 98% 98%    Intake/Output Summary (Last 24 hours) at 02/20/15 1151 Last data filed at 02/20/15 1004  Gross per 24 hour  Intake    720 ml  Output      0 ml  Net    720 ml   Filed Weights   02/16/15 0500 02/17/15 0500 02/20/15 0550  Weight: 64.4 kg (141 lb 15.6 oz) 65.2 kg (143 lb 11.8 oz) 68.7 kg (151 lb 7.3 oz)    Exam:   General:  Awake, alert, able to answer questions appropriately.  Cardiovascular: irregular  Respiratory: coarse bilateral BS  Abdomen: S/NT/ND/+BS  Extremities: no C/C/E   Neurologic:  Moves all 4 spontaneously  Data Reviewed: Basic Metabolic Panel:  Recent Labs Lab 02/15/15 0953 02/16/15 0440 02/17/15 0300 02/18/15 0556 02/19/15 0939 02/20/15 0557  NA 137 140 143 139 135 139  K 4.8 3.9 3.2* 3.6 5.3* 4.3  CL 113* 107 99* 96* 96* 98*  CO2 14* 18* 28 26 23 24   GLUCOSE 126* 78 64* 75 98 93  BUN 62* 63* 65* 59* 55* 58*  CREATININE 6.42* 6.39* 6.34* 6.06* 5.38* 5.57*  CALCIUM 6.1* 5.9* 5.9* 6.1* 7.4* 7.8*  MG 1.6*  --   --   --   --   --   PHOS  --   --  5.0*  --   --   --    Liver Function Tests:  Recent Labs Lab 02/14/15 1430 02/15/15 0328 02/15/15 0953 02/17/15 0300  AST 22 22 26  35  ALT 14 14 15 17   ALKPHOS 88 75 84 76  BILITOT 0.6 0.5 0.5 1.1  PROT 7.2 5.4* 5.4* 4.8*  ALBUMIN 3.5 2.5* 2.5* 2.0*   No results for input(s): LIPASE, AMYLASE in the last 168 hours. No results for input(s): AMMONIA in the last 168 hours. CBC:  Recent Labs Lab 02/16/15 0440 02/17/15 0300 02/18/15 0556 02/19/15 0451 02/20/15 0557  WBC 11.0* 6.2 5.5 5.9 7.6  HGB 8.7* 7.8* 8.1* 8.5* 8.0*  HCT 25.3* 21.7* 22.9* 24.1* 23.3*  MCV 87.2 85.8 86.7 87.0 88.9  PLT 172 123* 117* 110* 123*   Cardiac Enzymes:  Recent Labs Lab 02/14/15 1430  CKTOTAL 350*  TROPONINI 0.03   BNP (last 3 results)  Recent Labs  04/09/14 0038  BNP 1385.0*    ProBNP (last 3 results) No results for input(s): PROBNP in the  last 8760 hours.  CBG:  Recent Labs Lab 02/14/15 1438 02/17/15 1622  GLUCAP 99 73    Recent Results (from the past 240 hour(s))  MRSA PCR Screening     Status: None   Collection Time: 02/14/15  2:08 PM  Result Value Ref Range Status   MRSA by PCR NEGATIVE NEGATIVE Final    Comment:        The GeneXpert MRSA Assay (FDA approved for NASAL specimens only), is one component of a comprehensive MRSA colonization surveillance program. It is not intended to diagnose MRSA infection nor to guide or monitor treatment for MRSA infections.   Urine culture     Status: None   Collection Time: 02/14/15  2:10 PM  Result Value Ref Range Status   Specimen Description URINE, CATHETERIZED  Final   Special Requests NONE  Final   Culture   Final    >=100,000 COLONIES/mL ESCHERICHIA COLI Performed at [facility]    Report Status 02/17/2015 FINAL  Final   Organism ID, Bacteria ESCHERICHIA COLI  Final      Susceptibility   Escherichia coli - MIC*    AMPICILLIN >=32 RESISTANT Resistant     CEFAZOLIN <=4 SENSITIVE Sensitive     CEFTRIAXONE <=1 SENSITIVE Sensitive     CIPROFLOXACIN <=0.25 SENSITIVE Sensitive     GENTAMICIN <=1 SENSITIVE Sensitive     IMIPENEM <=0.25 SENSITIVE Sensitive     NITROFURANTOIN <=16 SENSITIVE Sensitive     TRIMETH/SULFA <=20 SENSITIVE Sensitive     AMPICILLIN/SULBACTAM 16 INTERMEDIATE Intermediate     PIP/TAZO <=4 SENSITIVE Sensitive     * >=100,000 COLONIES/mL ESCHERICHIA COLI  Blood Culture (routine x 2)     Status: None   Collection Time: 02/14/15  2:30 PM  Result Value Ref Range Status   Specimen Description BLOOD LEFT ANTECUBITAL DRAWN BY RN  Final   Special Requests BOTTLES DRAWN AEROBIC ONLY 6CC  Final   Culture NO GROWTH 6 DAYS  Final   Report Status 02/20/2015 FINAL  Final  Blood Culture (routine x 2)     Status: None   Collection Time: 02/15/15  3:28 AM  Result Value Ref Range Status   Specimen Description BLOOD A-LINE DRAW DRAWN BY RN JD   Final   Special Requests BOTTLES DRAWN AEROBIC AND ANAEROBIC [place]  Final   Culture NO GROWTH 5 DAYS  Final   Report Status 02/20/2015 FINAL  Final     Studies: Dg Chest Port 1v Same Day  02/19/2015  CLINICAL DATA:  Shortness of Breath EXAM: PORTABLE CHEST 1 VIEW COMPARISON:  02/14/2015 FINDINGS: Right jugular catheter is again identified at the cavoatrial junction. A defibrillator is again seen and stable.  Cardiac shadow is stable. Increasing left retrocardiac density is noted with associated effusion. Mild atelectatic changes are noted in the right base. IMPRESSION: Increasing left retrocardiac infiltrate with associated small effusion. Mild right basilar density is noted as well. Electronically Signed   By: Inez Catalina M.D.   On: 02/19/2015 11:43    Scheduled Meds: . calcium carbonate (dosed in mg elemental calcium)  1,000 mg of elemental calcium Oral TID  . cefTRIAXone (ROCEPHIN)  IV  1 g Intravenous Q24H  . ezetimibe  10 mg Oral Daily  . furosemide  100 mg Intravenous BID  . levothyroxine  25 mcg Oral QAC breakfast  . pantoprazole  40 mg Oral Daily  . rosuvastatin  40 mg Oral q1800  . sertraline  100 mg Oral Daily   Continuous Infusions: . heparin 450 Units/hr (02/19/15 2152)    Active Problems:   Hypertension   GERD (gastroesophageal reflux disease)   Chronic systolic CHF (congestive heart failure) ([place])   H/O PAF   Cardiomyopathy, ischemic- EF 25-30% 2D 09/02/13   CAD- total LAD- residual OM2 disease   Suicide attempt Sept 2015   Hypothyroidism   S/P ICD (internal cardiac defibrillator) procedure, [employer]. Jude device, 09/13/14   Sepsis ([place])   Acute renal failure (ARF) (HCC)   Chronic anticoagulation-Eliquis   Acute renal failure ([place])    Time spent: 25 minutes. Greater than 50% of this time was spent in direct contact with the patient coordinating care.    Lelon Frohlich  [facility] Pager [phone]  If 7PM-7AM, please contact night-coverage at  [email or web address], password [facility] 02/20/2015, 11:51 AM  LOS: 6 days

## 2015-02-20 NOTE — Consult Note (Signed)
Consultation Note Date: 02/20/2015   Patient Name: [name]  DOB: 1955-11-10  MRN: [id number]  Age / Sex: 60 y.o., female  PCP: Boykin Nearing, MD Referring Physician: Koleen Nimrod Acost*  Reason for Consultation: Establishing goals of care and Psychosocial/spiritual support    Clinical Assessment/Narrative: [name] is resting in bed watching TV, no family at bedside.  We talk about her current health concerns and possible treatments.  She asks about her heart function now that her kidney function is poor.  We talk about trying to get fluid off with IV lasix, and checking labs tomorrow.  We talk about her choices for HD.  I suggest that we have this discussion with her sister.  She tells me that she has 2 children, Anderson Malta and Driscoll, but she doesn't know how to contact them.    Contacts/Participants in Discussion:[name] Primary Decision Maker: [name]   Relationship to Patient self HCPOA: no  [name] tells me that she wants her sister [name] to make choices for her if she can not.  "she knows what I want".   SUMMARY OF RECOMMENDATIONS  Code Status/Advance Care Planning: Full code at this time.  We discuss the realities of chest compressions, defibrillation, and intubation. [name] is unsure about code status at this time. I encourage her to think about what this would look like for her and we will talk more tomorrow.      Code Status Orders        Start     Ordered   02/14/15 2008  Full code   Continuous     02/14/15 2007    Code Status History    Date Active Date Inactive Code Status Order ID Comments User Context   10/16/2014  2:36 AM 10/16/2014  9:34 PM Full Code [id number]  Edwin Dada, MD Inpatient   09/13/2014  5:29 PM 09/14/2014  1:14 PM Full Code [id number]  [name], MD Inpatient   04/14/2014  4:15 PM 04/14/2014  7:39 PM Full Code [id number]  Larey Dresser,  MD Inpatient   04/09/2014  5:15 AM 04/10/2014  5:04 PM Full Code [id number]  Orvan Falconer, MD Inpatient   10/03/2013 11:42 PM 10/13/2013  8:09 PM Full Code [id number]  Thad Ranger, MD Inpatient   09/03/2013  4:34 PM 09/07/2013  9:22 PM Full Code [id number]  Belva Crome, MD Inpatient   09/02/2013  1:00 PM 09/03/2013  4:34 PM Full Code [id number]  Sanda Klein, MD Inpatient   06/06/2013 12:10 AM 06/08/2013  7:17 PM Full Code [id number]  Etta Quill, DO ED   05/10/2013  8:30 PM 05/15/2013  2:07 PM Full Code [id number]  [name], MD Inpatient   05/06/2013  2:25 PM 05/10/2013  8:30 PM Full Code [id number]  Sheila Oats, MD Inpatient      Other Directives:None  Symptom Management:   Per hospitalist  Palliative Prophylaxis:   Frequent Pain Assessment, Oral Care and Turn Reposition  Additional Recommendations (Limitations, Scope, Preferences):  Full Scope Treatment at this time. a  Psycho-social/Spiritual:  Support System: Tieton Desire for further Chaplaincy support:Not discussed today.  Additional Recommendations: None at this time.  Prognosis: Unable to determine, based on outcomes.   Discharge Planning: based on outcomes, likely rehab   Chief Complaint/ Primary Diagnoses: Present on Admission:  . Sepsis ([place]) . S/P ICD (internal cardiac defibrillator) procedure, St. Jude device, 09/13/14 . Hypothyroidism . Cardiomyopathy, ischemic- EF 25-30% 2D 09/02/13 . Acute renal  failure (ARF) ([place]) . Chronic systolic CHF (congestive heart failure) ([place]) . H/O PAF . CAD- total LAD- residual OM2 disease . Hypertension . GERD (gastroesophageal reflux disease) . Suicide attempt Sept 2015  I have reviewed the medical record, interviewed the patient and family, and examined the patient. The following aspects are pertinent.  Past Medical History  Diagnosis Date  . Diverticulosis   . Pancreatitis 1980, 2015    in the setting of ETOH  . chronic systolic heart failure     a. ECHO (05/08/13):  EF 20-25%, diff HK with akinesis of basal inferior wall, grade 1 DD, trivial MR, trivial TR b) RHC (05/06/13): RA 7, RV 30/2, PA 31/19 (24), PCWP 10, Fick CO/CI: 2.9/1.74, Thermo CO/Ci: 2.4/1.4, PA sat 53%  . Ischemic cardiomyopathy   . CAD (coronary artery disease)     a. LHC (04/2013): Chronically occluded LAD, moderate dz in large OM1 and severe dz and small posterior lateral vessel    . Persistent atrial fibrillation ([place]) 2015  . Suicidal overdose ([place]) 10/03/13    Overdoes on Beta Blockers & Xarelto [facility])  . Anxiety 2015   . Psoriasis 1968  . GERD (gastroesophageal reflux disease) 2015   . Hyperlipidemia 2015  . Hypertension 2015  . ETOH abuse 1981    started abusing alcohol at age 10, quit   . Hypothyroidism   . PONV (postoperative nausea and vomiting)   . Mass of right breast on mammogram 07/14/2014  . AICD (automatic cardioverter/defibrillator) present   . Myocardial infarction ([place])     "I've had many" (09/13/2014)  . Anginal pain ([place])   . Anemia   . History of blood transfusion     "related to losing blood from somewhere; never found out from where"  . Stroke [facility]) 2011    left side weakness, minimal  . Depression   . 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/2014   Social History   Social History  . Marital Status: Married    Spouse Name: N/A  . Number of Children: 2  . Years of Education: 10   Occupational History  . Unemployed     Social History Main Topics  . Smoking status: Current Some Day Smoker -- 0.12 packs/day for 48 years    Types: Cigarettes  . Smokeless tobacco: Never Used  . Alcohol Use: No     Comment: last drink in [id number], former alcoholic  . Drug Use: No  . Sexual Activity: No   Other Topics Concern  . None   Social History Narrative   Lived previously with her spouse in an [place].     Son and daughter   Daughter in [place]   Son in [place]   Two grandchildren.       Presently in a group home [place], moved in 10/13/13.          Family History  Problem Relation Age of Onset  . CAD Father 31    MI  . CAD Mother 54    MI  . Alcohol abuse Mother   . Colon cancer Mother     in her 60s  . CAD Sister 2    Stent  . Diabetes Sister   . CAD Brother 25    MI  . Cancer Neg Hx    Scheduled Meds: . calcium carbonate (dosed in mg elemental calcium)  1,000 mg of elemental calcium Oral TID  . cefTRIAXone (ROCEPHIN)  IV  1  g Intravenous Q24H  . ezetimibe  10 mg Oral Daily  . furosemide  100 mg Intravenous BID  . levothyroxine  25 mcg Oral QAC breakfast  . pantoprazole  40 mg Oral Daily  . rosuvastatin  40 mg Oral q1800  . sertraline  100 mg Oral Daily   Continuous Infusions: . heparin 450 Units/hr (02/19/15 2152)   PRN Meds:.acetaminophen, albuterol, ondansetron **OR** ondansetron (ZOFRAN) IV, promethazine, zolpidem Medications Prior to Admission:  Prior to Admission medications   Medication Sig Start Date End Date Taking? Authorizing Provider  apixaban (ELIQUIS) 5 MG TABS tablet Take 1 tablet (5 mg total) by mouth 2 (two) times daily. 12/30/13  Yes Amy D Clegg, NP  carvedilol (COREG) 12.5 MG tablet Take 1 tablet (12.5 mg total) by mouth 2 (two) times daily. 01/18/15  Yes Larey Dresser, MD  ezetimibe (ZETIA) 10 MG tablet Take 1 tablet (10 mg total) by mouth daily. 01/20/15  Yes Larey Dresser, MD  furosemide (LASIX) 20 MG tablet Take 1 tablet (20 mg total) by mouth every other day. 05/06/14  Yes Larey Dresser, MD  levothyroxine (SYNTHROID, LEVOTHROID) 25 MCG tablet Take 25 mcg by mouth daily before breakfast.    Yes Historical Provider, MD  LORazepam (ATIVAN) 0.5 MG tablet Take 1 tablet (0.5 mg total) by mouth 2 (two) times daily. 12/30/13  Yes Josalyn Funches, MD  pantoprazole (PROTONIX) 40 MG tablet TAKE 1 TABLET BY MOUTH TWICE DAILY BEFORE A MEAL. 02/25/14  Yes Carlis Stable, NP  rosuvastatin (CRESTOR) 40 MG tablet Take 1 tablet (40 mg total) by mouth daily. D/C order  for Atorvastatin 11/22/14  Yes Larey Dresser, MD  sacubitril-valsartan (ENTRESTO) 24-26 MG Take 1 tablet by mouth 2 (two) times daily. 12/21/14  Yes Larey Dresser, MD  sertraline (ZOLOFT) 50 MG tablet Take 100 mg by mouth daily.   Yes Historical Provider, MD  spironolactone (ALDACTONE) 25 MG tablet Take 0.5 tablets (12.5 mg total) by mouth daily. 05/06/14  Yes Larey Dresser, MD  acetaminophen (TYLENOL) 325 MG tablet Take 650 mg by mouth every 6 (six) hours as needed for mild pain.    Historical Provider, MD  ammonium lactate (AMLACTIN) 12 % cream Apply 1 g topically daily as needed for dry skin (apply to elbows).     Historical Provider, MD  nitroGLYCERIN (NITROSTAT) 0.3 MG SL tablet Place 0.3 mg under the tongue every 5 (five) minutes as needed for chest pain (MAX 3 TABLETS). Reported on 01/18/2015    Historical Provider, MD  ondansetron (ZOFRAN) 4 MG tablet Take 4 mg by mouth every 8 (eight) hours as needed for nausea or vomiting.    Historical Provider, MD  zolpidem (AMBIEN) 10 MG tablet Take 1 tablet (10 mg total) by mouth at bedtime as needed for sleep. 01/18/15 02/17/15  Larey Dresser, MD   Allergies  Allergen Reactions  . Morphine And Related Other (See Comments)    Dizziness and hallucinations  . Penicillins Anaphylaxis and Rash    Has patient had a PCN reaction causing immediate rash, facial/tongue/throat swelling, SOB or lightheadedness with hypotension:unknown Has patient had a PCN reaction causing severe rash involving mucus membranes or skin necrosis: unknown Has patient had a PCN reaction that required hospitalization unknown Has patient had a PCN reaction occurring within the last 10 years: unknown If all of the above answers are "NO", then may proceed with Cephalosporin use.     Review of Systems  Unable to perform ROS: Acuity  of condition    Physical Exam  Nursing note and vitals reviewed. Constitutional: No distress.  HENT:  Head: Normocephalic and atraumatic.    Cardiovascular: Normal rate.   swelling  Respiratory: Effort normal.  GI: Soft. There is no guarding.  Neurological: She is alert.  Skin: Skin is warm and dry.  Anasarca hips thighs.     Vital Signs: BP 120/83 mmHg  Pulse 98  Temp(Src) 97.9 F (36.6 C) (Oral)  Resp 22  Ht 5\' 4"  (1.626 m)  Wt 68.7 kg (151 lb 7.3 oz)  BMI 25.98 kg/m2  SpO2 93%  LMP 01/22/1988 (Approximate)  SpO2: SpO2: 93 % O2 Device:SpO2: 93 % O2 Flow Rate: .O2 Flow Rate (L/min): 8 L/min  IO: Intake/output summary:  Intake/Output Summary (Last 24 hours) at 02/20/15 1559 Last data filed at 02/20/15 1004  Gross per 24 hour  Intake    240 ml  Output    100 ml  Net    140 ml    LBM: Last BM Date: 02/18/15 Baseline Weight: Weight: 54.432 kg (120 lb) Most recent weight: Weight: 68.7 kg (151 lb 7.3 oz)      Palliative Assessment/Data:  Flowsheet Rows        Most Recent Value   Intake Tab    Referral Department  Hospitalist   Palliative Care Primary Diagnosis  Nephrology   Date Notified  02/20/15   Palliative Care Type  New Palliative care   Reason for referral  Psychosocial or Spiritual support, Clarify Goals of Care, Advance Care Planning   Date of Admission  02/14/15   Date first seen by Palliative Care  02/20/15   # of days Palliative referral response time  0 Day(s)   # of days IP prior to Palliative referral  6   Clinical Assessment    Palliative Performance Scale Score  30%   Pain Max last 24 hours  Other (Comment)   Pain Min Last 24 hours  Other (Comment)   Dyspnea Max Last 24 Hours  Other (Comment)   Dyspnea Min Last 24 hours  Other (Comment)   Psychosocial & Spiritual Assessment    Social Work Plan of Care  Staff support   Palliative Care Outcomes    Patient/Family meeting held?  Yes   Who was at the meeting?  patient only today   Palliative Care Outcomes  Provided end of life care assistance, Provided advance care planning, Provided psychosocial or spiritual support   Palliative Care  follow-up planned  Yes, Facility      Additional Data Reviewed:  CBC:    Component Value Date/Time   WBC 7.6 02/20/2015 0557   HGB 8.0* 02/20/2015 0557   HCT 23.3* 02/20/2015 0557   PLT 123* 02/20/2015 0557   MCV 88.9 02/20/2015 0557   NEUTROABS 3.0 01/03/2015 1430   LYMPHSABS 1.9 01/03/2015 1430   MONOABS 0.5 01/03/2015 1430   EOSABS 0.1 01/03/2015 1430   BASOSABS 0.1 01/03/2015 1430   Comprehensive Metabolic Panel:    Component Value Date/Time   NA 139 02/20/2015 0557   K 4.3 02/20/2015 0557   CL 98* 02/20/2015 0557   CO2 24 02/20/2015 0557   BUN 58* 02/20/2015 0557   CREATININE 5.57* 02/20/2015 0557   GLUCOSE 93 02/20/2015 0557   CALCIUM 7.8* 02/20/2015 0557   AST 35 02/17/2015 0300   ALT 17 02/17/2015 0300   ALKPHOS 76 02/17/2015 0300   BILITOT 1.1 02/17/2015 0300   PROT 4.8* 02/17/2015 0300   ALBUMIN 2.0*  02/17/2015 0300     Time In: 1410 Time Out: 1510 Time Total: 60 minutes GOC discussion shared with nursing staff, CM, SW, and Dr. Jerilee Hoh.  Greater than 50%  of this time was spent counseling and coordinating care related to the above assessment and plan.  Signed by: Drue Novel, NP  Drue Novel, NP  02/20/2015, 3:59 PM  Please contact Palliative Medicine Team phone at [phone] for questions and concerns.

## 2015-02-20 NOTE — Progress Notes (Signed)
Subjective: Patient complains of not feeling good. She has occasional difficulty in breathing. Poor appetite . No nausea or vomiting  medical contraindication.   Objective: Vital signs in last 24 hours: Temp:  [97.8 F (36.6 C)-98.2 F (36.8 C)] 97.9 F (36.6 C) (01/30 0550) Pulse Rate:  [78-104] 100 (01/30 0732) Resp:  [18-24] 24 (01/30 0732) BP: (119-128)/(74-93) 119/79 mmHg (01/30 0550) SpO2:  [95 %-98 %] 98 % (01/30 0732) Weight:  [151 lb 7.3 oz (68.7 kg)] 151 lb 7.3 oz (68.7 kg) (01/30 0550)  Intake/Output from previous day: 01/29 0701 - 01/30 0700 In: 480 [P.O.:480] Out: -  Intake/Output this shift:     Recent Labs  02/18/15 0556 02/19/15 0451 02/20/15 0557  HGB 8.1* 8.5* 8.0*    Recent Labs  02/19/15 0451 02/20/15 0557  WBC 5.9 7.6  RBC 2.77* 2.62*  HCT 24.1* 23.3*  PLT 110* 123*    Recent Labs  02/19/15 0939 02/20/15 0557  NA 135 139  K 5.3* 4.3  CL 96* 98*  CO2 23 24  BUN 55* 58*  CREATININE 5.38* 5.57*  GLUCOSE 98 93  CALCIUM 7.4* 7.8*   No results for input(s): LABPT, INR in the last 72 hours.  Generally patient is alert and in no apparent distress Chest: She is clear Heart exam regular rate and rhythm no murmur Abdomen: Positive bowel sounds Extremities no edema  Assessment/Plan: Problem #1 acute kidney injury superimposed on chronic. Etiology was thought to be secondary to combination of ATN/prerenal/RAAS. Her renal function start increasing. Not sure whether her poor appetite is associated with uremia. Urine out put is not documented. Problem #3 history of ischemic cardiomyopathy with low ejection fraction. She is also status post AICD placement. Patient with some difficulty in breathing. Her weight seems to be increasing Problem #4 anemia: Her hemoglobin is low but stable. Problem #5 history of hypertension: Her blood pressure is presently was in acceptable range however she has episode of hypotension. Problem #6 history of chronic  renal failure. Patient creatinine was 1.26 with a GFR of 46 mL/m about 4 weeks ago on 01/18/2015. That's his stage III chronic renal failure Problem #7 hypocalcemia: Patient is on calcium supplement . Her calcium is improving Problem#8 Hypokalmia: Potassium is normal Plan: 1] we'll start patient on Lasix 100 mg iv bid 2] If her renal function declines we may consider initiating  Dialysis. 2] we'll check compressive metabolic panel today and tomorrow.   [name] S 02/20/2015, 8:00 AM

## 2015-02-20 NOTE — Progress Notes (Addendum)
Primary Cardiologist: [name] MD  Cardiology Specific Problem List: 1. CAD 2. PAF 3. Acute on chronic systolic CHF 4. ICM with ICD in situ (St. Jude)  Subjective:    Still short of breath. No chest pain or palpitations. Limited appetite. No abdominal pain. Reports worry about stating dialysis.  Objective:   Temp:  [97.8 F (36.6 C)-98.2 F (36.8 C)] 97.9 F (36.6 C) (01/30 0550) Pulse Rate:  [78-104] 100 (01/30 0732) Resp:  [18-24] 24 (01/30 0732) BP: (119-128)/(74-93) 119/79 mmHg (01/30 0550) SpO2:  [95 %-98 %] 98 % (01/30 0732) Weight:  [151 lb 7.3 oz (68.7 kg)] 151 lb 7.3 oz (68.7 kg) (01/30 0550) Last BM Date: 02/18/15  Filed Weights   02/16/15 0500 02/17/15 0500 02/20/15 0550  Weight: 141 lb 15.6 oz (64.4 kg) 143 lb 11.8 oz (65.2 kg) 151 lb 7.3 oz (68.7 kg)    Intake/Output Summary (Last 24 hours) at 02/20/15 0902 Last data filed at 02/19/15 1730  Gross per 24 hour  Intake    360 ml  Output      0 ml  Net    360 ml   Telemetry: Sinus rhythm.  Exam:  General: Frail, weak, ill-appearing.  Lungs: Rhonchi with mild expiratory wheezes.   Cardiac: Difficult to see JVP as she has right IJ, Heart sounds are obscured by lung sounds, RRR.   Abdomen: Normoactive bowel sounds, nontender, nondistended.  Extremities: No pitting edema, distal pulses full.  Lab Results:  Basic Metabolic Panel:  Recent Labs Lab 02/15/15 0953  02/18/15 0556 02/19/15 0939 02/20/15 0557  NA 137  < > 139 135 139  K 4.8  < > 3.6 5.3* 4.3  CL 113*  < > 96* 96* 98*  CO2 14*  < > 26 23 24   GLUCOSE 126*  < > 75 98 93  BUN 62*  < > 59* 55* 58*  CREATININE 6.42*  < > 6.06* 5.38* 5.57*  CALCIUM 6.1*  < > 6.1* 7.4* 7.8*  MG 1.6*  --   --   --   --   < > = values in this interval not displayed.   CBC:  Recent Labs Lab 02/18/15 0556 02/19/15 0451 02/20/15 0557  WBC 5.5 5.9 7.6  HGB 8.1* 8.5* 8.0*  HCT 22.9* 24.1* 23.3*  MCV 86.7 87.0 88.9  PLT 117* 110* 123*     Cardiac Enzymes:  Recent Labs Lab 02/14/15 1430  CKTOTAL 350*  TROPONINI 0.03    Coagulation:  Recent Labs Lab 02/15/15 0953  INR 2.38*   Echocardiogram 02/15/2015 02/15/2015 Left ventricle: Diffuse hypokinesis worse in the inferior wall and septum paradoxic motion ? from pacing The cavity size was moderately dilated. Wall thickness was normal. Systolic function was severely reduced. The estimated ejection fraction was in the range of 25% to 30%. - Atrial septum: No defect or patent foramen ovale was identified. - Pericardium, extracardiac: A trivial pericardial effusion was identified. - Impressions: No evidence of SBE or vegetation  Radiology: Dg Chest Port 1v Same Day  02/19/2015  CLINICAL DATA:  Shortness of Breath EXAM: PORTABLE CHEST 1 VIEW COMPARISON:  02/14/2015 FINDINGS: Right jugular catheter is again identified at the cavoatrial junction. A defibrillator is again seen and stable. Cardiac shadow is stable. Increasing left retrocardiac density is noted with associated effusion. Mild atelectatic changes are noted in the right base. IMPRESSION: Increasing left retrocardiac infiltrate with associated small effusion. Mild right basilar density is noted as well. Electronically Signed   By:  Inez Catalina M.D.   On: 02/19/2015 11:43    Medications:   Scheduled Medications: . calcium carbonate (dosed in mg elemental calcium)  1,000 mg of elemental calcium Oral TID  . cefTRIAXone (ROCEPHIN)  IV  1 g Intravenous Q24H  . ezetimibe  10 mg Oral Daily  . furosemide  100 mg Intravenous BID  . levothyroxine  25 mcg Oral QAC breakfast  . pantoprazole  40 mg Oral Daily  . rosuvastatin  40 mg Oral q1800  . sertraline  100 mg Oral Daily    Infusions: . heparin 450 Units/hr (02/19/15 2152)    PRN Medications: acetaminophen, albuterol, ondansetron **OR** ondansetron (ZOFRAN) IV, promethazine, zolpidem   Assessment and Plan:   1. Acute on Chronic Systolic CHF in  the setting of ICM: LVEF of 25%-30%. This is complicated by acute on chronic renal failure and volume overload. She has been taken off of Entresto, spironolactone, and Diuretics in the setting of renal failure. She is positive 4.7 liters since admission. Nephrology is following and plans to dialyze sometime this week to assist with fluid overload. Overall poor prognosis.  2. PAF: Currently remaining in NSR. Eliquis has been stopped for now in the setting of renal failure and anemia. She has been on bridge with Heparin GTT, but not clear if oral anticoagulant will be resumed at this time until hemoglobin trend better understood and there are no plans for invasive procedure or catheters.  3. Anemia: Hgb down to around 8.0, but relatively stable. No transfusions planned at this time.  4. CKD: Baseline creatinine 1.2-1.3 but now 5.57. Defer to nephrology as to timing of dialysis.   Phill Myron. Lawrence NP [place]  02/20/2015, 9:02 AM    Attending note:  Patient seen and examined. Reviewed records and modified above note by Ms. Lawrence NP. Ms. Milkey is admitted with sepsis felt to be potentially due to urine source, currently on antibiotics and no longer requiring pressors. In this setting she has also manifested acute on chronic renal failure and is nearing initiation of hemodialysis without improvement in creatinine, followed by Nephrology. She has evidence of volume overload and is still short of breath. Urine output not well recorded. Her LVEF is in the range of 25-30%, and she has been taken off most of her prior cardiac medications. She is maintaining sinus rhythm and is now off Eliquis with anemia, has Heparin GTT bridge.  On examination she is chronically ill-appearing but in no distress. Heart rate 80-100 in sinus rhythm by telemetry, blood pressure 119/79. Right IJ Place. Lungs exhibit decreased breath sounds at the bases with scattered rhonchi, cardiac exam with RRR, no definite S3 gallop. Lab  work reviewed finding BUN 58, creatinine 5.5, hemoglobin 8.0, platelets 123.  Patient with acute on chronic systolic heart failure complicated by acute on chronic renal failure with near end stage disease, creatinine not improving. She is being considered for initiation of hemodialysis at least temporarily per the Nephrology service. Her weight continues to go up. Cardiac regimen very limited at this time as most medications have been discontinued in the setting of her acute illness. Would keep her off oral anticoagulants until her clinical course is better understood as it relates to hemoglobin and also whether she will need to have any other invasive procedures or catheters placed. Did give consideration to inotropes such as milrinone, but doubt that this would be ultimately very effective at this point.  Satira Sark, M.D., F.A.C.C.

## 2015-02-20 NOTE — Progress Notes (Addendum)
Patient had an incontinent episode. The patient refused to have her bed changed after several attempts to change her linens. She stated that she did "not want to get up". Explained to patient that we can change her sheets while she is in the bed. Patient continues to refuse. Will try again later.

## 2015-02-20 NOTE — Progress Notes (Signed)
[facility] for Heparin Indication: atrial fibrillation  Allergies  Allergen Reactions  . Morphine And Related Other (See Comments)    Dizziness and hallucinations  . Penicillins Anaphylaxis and Rash    Has patient had a PCN reaction causing immediate rash, facial/tongue/throat swelling, SOB or lightheadedness with hypotension:unknown Has patient had a PCN reaction causing severe rash involving mucus membranes or skin necrosis: unknown Has patient had a PCN reaction that required hospitalization unknown Has patient had a PCN reaction occurring within the last 10 years: unknown If all of the above answers are "NO", then may proceed with Cephalosporin use.    Patient Measurements: Height: 5\' 4"  (162.6 cm) Weight: 151 lb 7.3 oz (68.7 kg) IBW/kg (Calculated) : 54.7  Vital Signs: Temp: 97.9 F (36.6 C) (01/30 0550) Temp Source: Oral (01/30 0550) BP: 119/79 mmHg (01/30 0550) Pulse Rate: 100 (01/30 0732)  Labs:  Recent Labs  02/18/15 0556 02/19/15 0451 02/19/15 0939 02/19/15 1541 02/20/15 0557  HGB 8.1* 8.5*  --   --  8.0*  HCT 22.9* 24.1*  --   --  23.3*  PLT 117* 110*  --   --  123*  APTT 71* 59*  --  68* 63*  HEPARINUNFRC 0.70 0.91*  --   --  0.55  CREATININE 6.06*  --  5.38*  --  5.57*    Estimated Creatinine Clearance: 10.2 mL/min (by C-G formula based on Cr of 5.57).  Medical History: Past Medical History  Diagnosis Date  . Diverticulosis   . Pancreatitis 1980, 2015    in the setting of ETOH  . chronic systolic heart failure     a. ECHO (05/08/13): EF 20-25%, diff HK with akinesis of basal inferior wall, grade 1 DD, trivial MR, trivial TR b) RHC (05/06/13): RA 7, RV 30/2, PA 31/19 (24), PCWP 10, Fick CO/CI: 2.9/1.74, Thermo CO/Ci: 2.4/1.4, PA sat 53%  . Ischemic cardiomyopathy   . CAD (coronary artery disease)     a. LHC (04/2013): Chronically occluded LAD, moderate dz in large OM1 and severe dz and small posterior lateral  vessel    . Persistent atrial fibrillation ([place]) 2015  . Suicidal overdose ([place]) 10/03/13    Overdoes on Beta Blockers & Xarelto [facility])  . Anxiety 2015   . Psoriasis 1968  . GERD (gastroesophageal reflux disease) 2015   . Hyperlipidemia 2015  . Hypertension 2015  . ETOH abuse 1981    started abusing alcohol at age 30, quit   . Hypothyroidism   . PONV (postoperative nausea and vomiting)   . Mass of right breast on mammogram 07/14/2014  . AICD (automatic cardioverter/defibrillator) present   . Myocardial infarction ([place])     "I've had many" (09/13/2014)  . Anginal pain ([place])   . Anemia   . History of blood transfusion     "related to losing blood from somewhere; never found out from where"  . Stroke [facility]) 2011    left side weakness, minimal  . Depression   . 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/2014   Medications:  Prescriptions prior to admission  Medication Sig Dispense Refill Last Dose  . apixaban (ELIQUIS) 5 MG TABS tablet Take 1 tablet (5 mg total) by mouth 2 (two) times daily. 60 tablet 3 Past Week at Unknown time  . carvedilol (COREG) 12.5 MG tablet Take 1 tablet (12.5 mg total) by mouth 2 (two) times daily. 60 tablet 6 Past Week at Unknown time  .  ezetimibe (ZETIA) 10 MG tablet Take 1 tablet (10 mg total) by mouth daily. 90 tablet 3 Past Week at Unknown time  . furosemide (LASIX) 20 MG tablet Take 1 tablet (20 mg total) by mouth every other day. 15 tablet 6 Past Week at Unknown time  . levothyroxine (SYNTHROID, LEVOTHROID) 25 MCG tablet Take 25 mcg by mouth daily before breakfast.    Past Week at Unknown time  . LORazepam (ATIVAN) 0.5 MG tablet Take 1 tablet (0.5 mg total) by mouth 2 (two) times daily. 60 tablet 2 Past Week at Unknown time  . pantoprazole (PROTONIX) 40 MG tablet TAKE 1 TABLET BY MOUTH TWICE DAILY BEFORE A MEAL. 60 tablet 5 Past Week at Unknown time  . rosuvastatin (CRESTOR) 40 MG tablet Take 1 tablet (40 mg total) by mouth  daily. D/C order for Atorvastatin 30 tablet 3 Past Week at Unknown time  . sacubitril-valsartan (ENTRESTO) 24-26 MG Take 1 tablet by mouth 2 (two) times daily. 60 tablet 5 Past Week at Unknown time  . sertraline (ZOLOFT) 50 MG tablet Take 100 mg by mouth daily.   Past Week at Unknown time  . spironolactone (ALDACTONE) 25 MG tablet Take 0.5 tablets (12.5 mg total) by mouth daily. 15 tablet 3 Past Week at Unknown time  . acetaminophen (TYLENOL) 325 MG tablet Take 650 mg by mouth every 6 (six) hours as needed for mild pain.   Unknown at Unknown time  . ammonium lactate (AMLACTIN) 12 % cream Apply 1 g topically daily as needed for dry skin (apply to elbows).    Unknown at Unknown time  . nitroGLYCERIN (NITROSTAT) 0.3 MG SL tablet Place 0.3 mg under the tongue every 5 (five) minutes as needed for chest pain (MAX 3 TABLETS). Reported on 01/18/2015   Unknown at Unknown time  . ondansetron (ZOFRAN) 4 MG tablet Take 4 mg by mouth every 8 (eight) hours as needed for nausea or vomiting.   Unknown at Unknown time  . zolpidem (AMBIEN) 10 MG tablet Take 1 tablet (10 mg total) by mouth at bedtime as needed for sleep. 30 tablet 0 Unknown at Unknown time   Assessment: 60yo female with h/o afib, pt was on Eliquis PTA.  Started heparin therapy. Admitted with ARF.  Heparin level is now therapeutic and aPTT is therapeutic.  No bleeding reported but CBC is slightly worse.      Goal of Therapy:  APTT 60-102 or Heparin level 0.3-0.7 Monitor platelets by anticoagulation protocol: Yes   Plan:  Continue Heparin infusion at 450 units/hr  aPTT and Heparin level daily CBC daily while on Heparin  Thanks for allowing pharmacy to be a part of this patient's care.  Hart Robinsons, PharmD Clinical Pharmacist 02/20/2015,7:37 AM

## 2015-02-20 NOTE — Progress Notes (Signed)
Patient's breathing is a little more labored than earlier. Lungs do sound wet. Paged midlevel. Order received to give 1 time dose of 40 mg lasix. Will give lasix and continue to monitor closely.

## 2015-02-21 LAB — CBC
HCT: 19.7 % — ABNORMAL LOW (ref 36.0–46.0)
Hemoglobin: 6.8 g/dL — CL (ref 12.0–15.0)
MCH: 30.6 pg (ref 26.0–34.0)
MCHC: 34.5 g/dL (ref 30.0–36.0)
MCV: 88.7 fL (ref 78.0–100.0)
PLATELETS: 98 10*3/uL — AB (ref 150–400)
RBC: 2.22 MIL/uL — AB (ref 3.87–5.11)
RDW: 13.3 % (ref 11.5–15.5)
WBC: 6.2 10*3/uL (ref 4.0–10.5)

## 2015-02-21 LAB — BASIC METABOLIC PANEL
Anion gap: 11 (ref 5–15)
BUN: 61 mg/dL — AB (ref 6–20)
CHLORIDE: 99 mmol/L — AB (ref 101–111)
CO2: 30 mmol/L (ref 22–32)
Calcium: 7.7 mg/dL — ABNORMAL LOW (ref 8.9–10.3)
Creatinine, Ser: 5.59 mg/dL — ABNORMAL HIGH (ref 0.44–1.00)
GFR calc Af Amer: 9 mL/min — ABNORMAL LOW (ref >60)
GFR, EST NON AFRICAN AMERICAN: 7 mL/min — AB (ref >60)
GLUCOSE: 104 mg/dL — AB (ref 65–99)
POTASSIUM: 3.1 mmol/L — AB (ref 3.5–5.1)
Sodium: 140 mmol/L (ref 135–145)

## 2015-02-21 LAB — HEPARIN LEVEL (UNFRACTIONATED): Heparin Unfractionated: 0.23 IU/mL — ABNORMAL LOW (ref 0.30–0.70)

## 2015-02-21 LAB — PREPARE RBC (CROSSMATCH)

## 2015-02-21 LAB — APTT: aPTT: 69 seconds — ABNORMAL HIGH (ref 24–37)

## 2015-02-21 MED ORDER — HYDRALAZINE HCL 10 MG PO TABS
10.0000 mg | ORAL_TABLET | Freq: Two times a day (BID) | ORAL | Status: DC
  Administered 2015-02-21 – 2015-02-24 (×7): 10 mg via ORAL
  Filled 2015-02-21 (×7): qty 1

## 2015-02-21 MED ORDER — SODIUM CHLORIDE 0.9 % IV SOLN
Freq: Once | INTRAVENOUS | Status: AC
  Administered 2015-02-21: 15:00:00 via INTRAVENOUS

## 2015-02-21 MED ORDER — FUROSEMIDE 10 MG/ML IJ SOLN
40.0000 mg | Freq: Once | INTRAMUSCULAR | Status: AC
Start: 2015-02-21 — End: 2015-02-21
  Administered 2015-02-21: 40 mg via INTRAVENOUS
  Filled 2015-02-21: qty 4

## 2015-02-21 MED ORDER — POTASSIUM CHLORIDE CRYS ER 20 MEQ PO TBCR
40.0000 meq | EXTENDED_RELEASE_TABLET | Freq: Two times a day (BID) | ORAL | Status: DC
  Administered 2015-02-21 – 2015-02-23 (×5): 40 meq via ORAL
  Filled 2015-02-21 (×7): qty 2

## 2015-02-21 NOTE — Consult Note (Signed)
Primary Cardiologist: [name] MD  Cardiology Specific Problem List: 1. CAD 2. PAF 3. Acute on Chronic Systolic CHF 4. ICM with ICD in situ  Subjective:    Feeling better today. Has started eating. Having some leg pain.   Objective:   Temp:  [97.4 F (36.3 C)-97.6 F (36.4 C)] 97.6 F (36.4 C) (01/31 0541) Pulse Rate:  [98-106] 98 (01/31 0541) Resp:  [20-22] 20 (01/31 0541) BP: (113-120)/(70-83) 117/70 mmHg (01/31 0541) SpO2:  [93 %-100 %] 98 % (01/31 0541) Weight:  [142 lb 1.6 oz (64.456 kg)] 142 lb 1.6 oz (64.456 kg) (01/31 0500) Last BM Date: 02/20/15  Filed Weights   02/17/15 0500 02/20/15 0550 02/21/15 0500  Weight: 143 lb 11.8 oz (65.2 kg) 151 lb 7.3 oz (68.7 kg) 142 lb 1.6 oz (64.456 kg)    Intake/Output Summary (Last 24 hours) at 02/21/15 0802 Last data filed at 02/21/15 0631  Gross per 24 hour  Intake 411.83 ml  Output   1501 ml  Net -1089.17 ml    Telemetry:  Sinus Rhythm.   Exam:  General: Appears comfortable.  Lungs: Upper airway congestion, but cleared with forced coughing.   Cardiac: Right IJ catheter, RRR, no gallop or rub.  Abdomen: Normoactive bowel sounds, nontender, nondistended.  Extremities: No pitting edema, distal pulses full.  Lab Results:  Basic Metabolic Panel:  Recent Labs Lab 02/15/15 0953  02/19/15 0939 02/20/15 0557 02/21/15 0625  NA 137  < > 135 139 140  K 4.8  < > 5.3* 4.3 3.1*  CL 113*  < > 96* 98* 99*  CO2 14*  < > 23 24 30   GLUCOSE 126*  < > 98 93 104*  BUN 62*  < > 55* 58* 61*  CREATININE 6.42*  < > 5.38* 5.57* 5.59*  CALCIUM 6.1*  < > 7.4* 7.8* 7.7*  MG 1.6*  --   --   --   --   < > = values in this interval not displayed.   CBC:  Recent Labs Lab 02/19/15 0451 02/20/15 0557 02/21/15 0625  WBC 5.9 7.6 6.2  HGB 8.5* 8.0* 6.8*  HCT 24.1* 23.3* 19.7*  MCV 87.0 88.9 88.7  PLT 110* 123* 98*    Cardiac Enzymes:  Recent Labs Lab 02/14/15 1430  CKTOTAL 350*  TROPONINI 0.03     Medications:   Scheduled Medications: . calcium carbonate (dosed in mg elemental calcium)  1,000 mg of elemental calcium Oral TID  . cefTRIAXone (ROCEPHIN)  IV  1 g Intravenous Q24H  . ezetimibe  10 mg Oral Daily  . furosemide  100 mg Intravenous BID  . levothyroxine  25 mcg Oral QAC breakfast  . pantoprazole  40 mg Oral Daily  . potassium chloride  40 mEq Oral BID  . rosuvastatin  40 mg Oral q1800  . sertraline  100 mg Oral Daily    Infusions: . heparin 450 Units/hr (02/19/15 2152)    PRN Medications: acetaminophen, albuterol, ondansetron **OR** ondansetron (ZOFRAN) IV, promethazine, zolpidem   Assessment and Plan:   1. Acute on Chronic Systolic CHF: She has started to have increased urine output, currently on lasix 100 mg BID. Creatinine is stable, but has not improved as yet. Has been off Entresto and Aldactone since presenting with sepsis. Potassium is 3.1 will replace.   2. Anemia: Significant drop in Hgb from 8.0 to 6.8. Will type and cross. Defer to PCP or nephrology for need to transfuse PRBCs. Recommended with her significant LV  dysfunction.  Platelets are 98. Consider stopping heparin.   3. PAF: Remains in NSR. Continue to hold Eliquis.   4. Acute on CKD: Baseline creatinine 1.2-1.3, Creatinine remains elevated at 5.5. She being followed by Nephrology. With increased UOP, still holding off on HD.   Phill Myron. Lawrence NP [place]  02/21/2015, 8:02 AM    Attending note:  Patient seen and examined. Modified above note by Ms. Lawrence NP. Urine output has picked up somewhat in the last 24 hours, she remains on high-dose Lasix 100 mg IV twice daily. Creatinine 5.5 with ongoing follow-up per Nephrology. At this point dialysis is still not being pursued in light of increased urine output and otherwise relative stability. She is starting to feel better in terms of breathing status and appetite.  On examination heart rate 90-100 in sinus rhythm by telemetry, systolic blood  pressure [id number]. Approximately 1400 cc output in the last 24 hours. Lungs exhibit decreased breath sounds at the bases, cardiac exam with RRR and no S3. Creatinine 5.5, hemoglobin down to 6.8 from 8.0, platelets 98.  Continue to manage acute on chronic systolic heart failure with IV Lasix presuming urine output continues to improve. Nephrology is following for additional recommendations and potential need for hemodialysis. Doubt that inotropic therapy would be of much use at this time since low output is not likely the sole reason for the patient's decline in renal function. Would recommend stopping heparin since she has had a significant drop in hemoglobin which is probably not completely explained by renal dysfunction. Would search for other causes of anemia and consider PRBC transfusion. We will initiate very low-dose hydralazine for afterload reduction, presuming blood pressure tolerates. Still hold off on beta blocker for now.  Satira Sark, M.D., F.A.C.C.

## 2015-02-21 NOTE — Progress Notes (Signed)
Daily Progress Note   Patient Name: [name]       Date: 02/21/2015 DOB: 05-28-1955  Age: 60 y.o. MRN#: [id number] Attending Physician: Mikki Harbor* Primary Care Physician: [name], MD Admit Date: 02/14/2015  Reason for Consultation/Follow-up: Disposition, Establishing goals of care and Psychosocial/spiritual support  Subjective: [name] is sitting up in bed with her worker from her group home, [name], at bedside.  She tells me, "I've had a transformation", that her fluid levels have decreased and she is "able to move".  We talk about her current health issues, and review her labs.  She tells me that she has a complaint about her medication/treatments, that 'they forgot to give me my lasix, and kept me on fluids".  I review her medications and when they were started.  I am unsure if she is mildly confused from her high creatinine.   We talk about HD and she shares that she has not decided if she will have treatments.  We talk about HCPOA, and she shares that her sister just had CABG and they had a disagreement on the phone.  She asks her group home worker if she is willing, she states this is better handled by relatives.  We talk about code status, (she was to think about this overnight) and she shares that she does not want CPR, defib, or intubation, instead allow a natural death.  We talk about DNR does not mean do not treat.   We talk about CHF management, and she states, "i need to change some habits".  She and [name] talk about low salt diet, and fluid restrictions.   Length of Stay: 7 days  Current Medications: Scheduled Meds:  . calcium carbonate (dosed in mg elemental calcium)  1,000 mg of elemental calcium Oral TID  . cefTRIAXone (ROCEPHIN)  IV  1 g  Intravenous Q24H  . ezetimibe  10 mg Oral Daily  . furosemide  100 mg Intravenous BID  . furosemide  40 mg Intravenous Once  . hydrALAZINE  10 mg Oral BID  . levothyroxine  25 mcg Oral QAC breakfast  . pantoprazole  40 mg Oral Daily  . potassium chloride  40 mEq Oral BID  . rosuvastatin  40 mg Oral q1800  . sertraline  100 mg Oral Daily  Continuous Infusions:    PRN Meds: acetaminophen, albuterol, ondansetron **OR** ondansetron (ZOFRAN) IV, promethazine, zolpidem  Physical Exam: Physical Exam  Constitutional: No distress.  HENT:  Head: Normocephalic and atraumatic.  Cardiovascular: Normal rate.   Pulmonary/Chest: Effort normal.  Abdominal: Soft.  Neurological: She is alert.  Skin: Skin is warm and dry.  Psychiatric: She has a normal mood and affect. Judgment and thought content normal.  Nursing note and vitals reviewed.               Vital Signs: BP 103/68 mmHg  Pulse 105  Temp(Src) 97.6 F (36.4 C) (Oral)  Resp 20  Ht 5\' 4"  (1.626 m)  Wt 64.456 kg (142 lb 1.6 oz)  BMI 24.38 kg/m2  SpO2 100%  LMP 01/22/1988 (Approximate) SpO2: SpO2: 100 % O2 Device: O2 Device: High Flow Nasal Cannula O2 Flow Rate: O2 Flow Rate (L/min): 8 L/min  Intake/output summary:  Intake/Output Summary (Last 24 hours) at 02/21/15 1511 Last data filed at 02/21/15 1240  Gross per 24 hour  Intake 351.83 ml  Output   2152 ml  Net -1800.17 ml   LBM: Last BM Date: 02/20/15 Baseline Weight: Weight: 54.432 kg (120 lb) Most recent weight: Weight: 64.456 kg (142 lb 1.6 oz)       Palliative Assessment/Data: Flowsheet Rows        Most Recent Value   Intake Tab    Referral Department  Hospitalist   Palliative Care Primary Diagnosis  Nephrology   Date Notified  02/20/15   Palliative Care Type  New Palliative care   Reason for referral  Psychosocial or Spiritual support, Clarify Goals of Care, Advance Care Planning   Date of Admission  02/14/15   Date first seen by Palliative Care  02/20/15    # of days Palliative referral response time  0 Day(s)   # of days IP prior to Palliative referral  6   Clinical Assessment    Palliative Performance Scale Score  30%   Pain Max last 24 hours  Other (Comment)   Pain Min Last 24 hours  Other (Comment)   Dyspnea Max Last 24 Hours  Other (Comment)   Dyspnea Min Last 24 hours  Other (Comment)   Psychosocial & Spiritual Assessment    Social Work Plan of Care  Staff support   Palliative Care Outcomes    Patient/Family meeting held?  Yes   Who was at the meeting?  patient only today   Palliative Care Outcomes  Provided end of life care assistance, Provided advance care planning, Provided psychosocial or spiritual support   Palliative Care follow-up planned  Yes, Facility      Additional Data Reviewed: CBC    Component Value Date/Time   WBC 6.2 02/21/2015 0625   RBC 2.22* 02/21/2015 0625   RBC 3.00* 06/16/2014 1705   HGB 6.8* 02/21/2015 0625   HCT 19.7* 02/21/2015 0625   PLT 98* 02/21/2015 0625   MCV 88.7 02/21/2015 0625   MCH 30.6 02/21/2015 0625   MCHC 34.5 02/21/2015 0625   RDW 13.3 02/21/2015 0625   LYMPHSABS 1.9 01/03/2015 1430   MONOABS 0.5 01/03/2015 1430   EOSABS 0.1 01/03/2015 1430   BASOSABS 0.1 01/03/2015 1430    CMP     Component Value Date/Time   NA 140 02/21/2015 0625   K 3.1* 02/21/2015 0625   CL 99* 02/21/2015 0625   CO2 30 02/21/2015 0625   GLUCOSE 104* 02/21/2015 0625   BUN 61* 02/21/2015 [id number]  CREATININE 5.59* 02/21/2015 0625   CALCIUM 7.7* 02/21/2015 0625   PROT 4.8* 02/17/2015 0300   ALBUMIN 2.0* 02/17/2015 0300   AST 35 02/17/2015 0300   ALT 17 02/17/2015 0300   ALKPHOS 76 02/17/2015 0300   BILITOT 1.1 02/17/2015 0300   GFRNONAA 7* 02/21/2015 0625   GFRAA 9* 02/21/2015 0625       Problem List:  Patient Active Problem List   Diagnosis Date Noted  . Palliative care encounter   . DNR (do not resuscitate) discussion   . Acute renal failure ([place])   . Chronic anticoagulation-Eliquis  02/15/2015  . Sepsis ([place]) 02/14/2015  . AKI (acute kidney injury) ([place]) 02/14/2015  . Acute renal failure (ARF) ([place]) 02/14/2015  . NSTEMI (non-ST elevated myocardial infarction) ([place]) 10/16/2014  . S/P ICD (internal cardiac defibrillator) procedure, St. 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 Ischemic cardiomyopathy 09/13/2014  . Mass of right breast on mammogram 07/14/2014  . Acute on chronic systolic CHF (congestive heart failure) ([place]) 04/09/2014  . CHF (congestive heart failure) ([place]) 04/09/2014  . CHF exacerbation ([place])   . Iron deficiency anemia 03/01/2014  . Gastritis and gastroduodenitis 02/28/2014  . Cervical cancer screening 12/30/2013  . Hypothyroidism 12/30/2013  . FH: colon cancer 11/18/2013  . Psoriasis 10/22/2013  . CKD (chronic kidney disease) stage 3, GFR 30-59 ml/min 10/22/2013  . Suicide attempt Sept 2015 10/03/2013  . Anxiety state 09/24/2013  . Cardiomyopathy, ischemic- EF 25-30% 2D 09/02/13 09/03/2013  . CAD- total LAD- residual OM2 disease 09/03/2013  . H/O PAF 06/06/2013  . Chronic systolic CHF (congestive heart failure) ([place]) 05/20/2013  . Tobacco abuse 05/09/2013  . Family history of premature CAD 05/09/2013  . H/O alcohol abuse 05/06/2013  . Hypertension   . GERD (gastroesophageal reflux disease)      Palliative Care Assessment & Plan    1.Code Status:  DNR    Code Status Orders        Start     Ordered   02/14/15 2008  Full code   Continuous     02/14/15 2007    Code Status History    Date Active Date Inactive Code Status Order ID Comments User Context   10/16/2014  2:36 AM 10/16/2014  9:34 PM Full Code [id number]  Edwin Dada, MD Inpatient   09/13/2014  5:29 PM 09/14/2014  1:14 PM Full Code [id number]  [name], MD Inpatient   04/14/2014  4:15 PM 04/14/2014  7:39 PM Full Code [id number]  Larey Dresser, MD Inpatient   04/09/2014  5:15 AM 04/10/2014  5:04 PM Full Code [id number]  Orvan Falconer, MD Inpatient   10/03/2013 11:42 PM 10/13/2013   8:09 PM Full Code [id number]  Thad Ranger, MD Inpatient   09/03/2013  4:34 PM 09/07/2013  9:22 PM Full Code [id number]  Belva Crome, MD Inpatient   09/02/2013  1:00 PM 09/03/2013  4:34 PM Full Code [id number]  Sanda Klein, MD Inpatient   06/06/2013 12:10 AM 06/08/2013  7:17 PM Full Code [id number]  Etta Quill, DO ED   05/10/2013  8:30 PM 05/15/2013  2:07 PM Full Code [id number]  [name], MD Inpatient   05/06/2013  2:25 PM 05/10/2013  8:30 PM Full Code [id number]  Sheila Oats, MD Inpatient       2. Goals of Care/Additional Recommendations:  Treat the treatable.   Limitations on Scope of Treatment: treat the treatable.   Desire for further Chaplaincy support:Not discussed today.   Psycho-social Needs: None  at this time.   3. Symptom Management:      1.per hospitalist  4. Palliative Prophylaxis:   Turn Reposition  5. Prognosis: Unable to determine, based on outcomes, likely 6 months or less based on severity of CHF.   6. Discharge Planning:  Return to group home vs SNF for rehab.     Care plan was discussed with nursing staff, CM, SW, and Dr. Jerilee Hoh.   Thank you for allowing the Palliative Medicine Team to assist in the care of this patient.   Time In: 1400 Time Out: 1430 Total Time  30 minutes  Prolonged Time Billed  no         Drue Novel, NP  02/21/2015, 3:11 PM  Please contact Palliative Medicine Team phone at [phone] for questions and concerns.

## 2015-02-21 NOTE — Progress Notes (Signed)
Subjective: Patient has still some difficulty breathing but better. Presently she denies any nausea vomiting. Her appetite is improving.  medical contraindication.   Objective: Vital signs in last 24 hours: Temp:  [97.4 F (36.3 C)-97.6 F (36.4 C)] 97.6 F (36.4 C) (01/31 0541) Pulse Rate:  [98-106] 98 (01/31 0541) Resp:  [20-22] 20 (01/31 0541) BP: (113-120)/(70-83) 117/70 mmHg (01/31 0541) SpO2:  [93 %-100 %] 98 % (01/31 0541) Weight:  [142 lb 1.6 oz (64.456 kg)] 142 lb 1.6 oz (64.456 kg) (01/31 0500)  Intake/Output from previous day: 01/30 0701 - 01/31 0700 In: 411.8 [P.O.:360; I.V.:51.8] Out: 1501 [Urine:1500; Stool:1] Intake/Output this shift:     Recent Labs  02/19/15 0451 02/20/15 0557 02/21/15 0625  HGB 8.5* 8.0* 6.8*    Recent Labs  02/20/15 0557 02/21/15 0625  WBC 7.6 6.2  RBC 2.62* 2.22*  HCT 23.3* 19.7*  PLT 123* 98*    Recent Labs  02/20/15 0557 02/21/15 0625  NA 139 140  K 4.3 3.1*  CL 98* 99*  CO2 24 30  BUN 58* 61*  CREATININE 5.57* 5.59*  GLUCOSE 93 104*  CALCIUM 7.8* 7.7*   No results for input(s): LABPT, INR in the last 72 hours.  Generally patient is alert and in no apparent distress Chest: She is clear Heart exam regular rate and rhythm no murmur Abdomen: Positive bowel sounds Extremities no edema  Assessment/Plan: Problem #1 acute kidney injury superimposed on chronic. Etiology was thought to be secondary to combination of ATN/prerenal/RAAS. Presently her renal function seems to be stable. Patient also is asymptomatic. Problem #3 history of ischemic cardiomyopathy with low ejection fraction. She is also status post AICD placement. Patient is started on Lasix at a higher dose. She had about 1500 mL of urine output and patient seems to be feeling better. Problem #4 anemia: Her hemoglobin is low and declining Problem #5 history of hypertension: Her blood pressure is presently was in acceptable range however she has episode of  hypotension. Problem #6 history of chronic renal failure. Patient creatinine was 1.26 with a GFR of 46 mL/m about 4 weeks ago on 01/18/2015. That's his stage III chronic renal failure Problem #7 hypocalcemia: Patient is on calcium supplement . Her calcium is improving. Her calcium is stable. Problem#8 Hypokalmia: Potassium has declined. Most likely secondary to diuretics. Plan: 1] we'll continue with diuretics. 2] we'll start her on potassium chloride 40 mEq by mouth twice a day 3] we'll check basic metabolic panel tomorrow.   [name] S 02/21/2015, 7:53 AM

## 2015-02-21 NOTE — Progress Notes (Signed)
[facility] PROGRESS NOTE  [name] [id number] DOB: 28-Nov-1955 DOA: 02/14/2015 PCP: [name], MD  Assessment/Plan:  Acute Hypoxemic Respiratory Failure -Believe related to fluid overload. -much improved today with diuresis. -Lasix dose has been increased to 100 mg BID per renal recommendations.  Anemia -Source unclear. -Agree with discontinuation of heparin. -Will type and cross and transfuse 2 units of PRBCs today with lasix in between. -Follow for signs of volume overload.  Severe sepsis with hypotension -Was initially pressor dependent. -The presumed source is urine.  -Continue rocephin. Can switch to cipro at time of DC if abx still required. -Has 2 days of antibiotics remaining to complete 7 days of treatment.  Encephalopathy -Resolved.  E Coli UTI -Rocephin as above.  Acute renal failure on CKD Stage III -Baseline Cr appears to be around 1.2-1.3. -Suspect related to sepsis, hypotension as well as profound dehydration. -Renal ultrasound without signs of hydronephrosis or obstruction. -Lasix, Aldactone, entresto have been appropriately held. -Appreciate renal input and recommendations. -Concern for need of HD if she does not continue to improve. -She has had good UOP overnight on high dose lasix 100 mg BID. -Metabolic acidosis has resolved.   Paroxysmal atrial fibrillation -Currently rate controlled, eliquis remains on hold given degree of renal dysfunction. -Off anticoagulation due transfusion-dependent anemia.  History of ischemic cardiomyopathy  -2-D echo from March 2016 shows an ejection fraction of 25-30% with diffuse hypokinesis and grade 2 diastolic dysfunction. - status post AICD. -Continue high dose lasix and strive for negative fluid balance.  Code Status: Full code Family Communication: Patient only  Disposition Plan: to be determined. Palliative care  following.  Consultants:  Cardiology  Nephrology   Antibiotics:  Rocephin  Subjective: States her SOB is improved. Still concerned about possibility of dialysis.  Objective: Filed Vitals:   02/20/15 2120 02/21/15 0500 02/21/15 0541 02/21/15 0809  BP: 113/71  117/70   Pulse: 106  98 100  Temp: 97.4 F (36.3 C)  97.6 F (36.4 C)   TempSrc: Oral  Oral   Resp: 20  20 20   Height:      Weight:  64.456 kg (142 lb 1.6 oz)    SpO2: 100%  98% 93%    Intake/Output Summary (Last 24 hours) at 02/21/15 1126 Last data filed at 02/21/15 0956  Gross per 24 hour  Intake 411.83 ml  Output   1952 ml  Net -1540.17 ml   Filed Weights   02/17/15 0500 02/20/15 0550 02/21/15 0500  Weight: 65.2 kg (143 lb 11.8 oz) 68.7 kg (151 lb 7.3 oz) 64.456 kg (142 lb 1.6 oz)    Exam:   General:  Awake, alert, able to answer questions appropriately.  Cardiovascular: irregular  Respiratory: coarse bilateral BS  Abdomen: S/NT/ND/+BS  Extremities: no C/C/E   Neurologic:  Moves all 4 spontaneously  Data Reviewed: Basic Metabolic Panel:  Recent Labs Lab 02/15/15 0953  02/17/15 0300 02/18/15 0556 02/19/15 0939 02/20/15 0557 02/21/15 0625  NA 137  < > 143 139 135 139 140  K 4.8  < > 3.2* 3.6 5.3* 4.3 3.1*  CL 113*  < > 99* 96* 96* 98* 99*  CO2 14*  < > 28 26 23 24 30   GLUCOSE 126*  < > 64* 75 98 93 104*  BUN 62*  < > 65* 59* 55* 58* 61*  CREATININE 6.42*  < > 6.34* 6.06* 5.38* 5.57* 5.59*  CALCIUM 6.1*  < > 5.9* 6.1* 7.4* 7.8* 7.7*  MG 1.6*  --   --   --   --   --   --   PHOS  --   --  5.0*  --   --   --   --   < > = values in this interval not displayed. Liver Function Tests:  Recent Labs Lab 02/14/15 1430 02/15/15 0328 02/15/15 0953 02/17/15 0300  AST 22 22 26  35  ALT 14 14 15 17   ALKPHOS 88 75 84 76  BILITOT 0.6 0.5 0.5 1.1  PROT 7.2 5.4* 5.4* 4.8*  ALBUMIN 3.5 2.5* 2.5* 2.0*   No results for input(s): LIPASE, AMYLASE in the last 168 hours. No results for  input(s): AMMONIA in the last 168 hours. CBC:  Recent Labs Lab 02/17/15 0300 02/18/15 0556 02/19/15 0451 02/20/15 0557 02/21/15 0625  WBC 6.2 5.5 5.9 7.6 6.2  HGB 7.8* 8.1* 8.5* 8.0* 6.8*  HCT 21.7* 22.9* 24.1* 23.3* 19.7*  MCV 85.8 86.7 87.0 88.9 88.7  PLT 123* 117* 110* 123* 98*   Cardiac Enzymes:  Recent Labs Lab 02/14/15 1430  CKTOTAL 350*  TROPONINI 0.03   BNP (last 3 results)  Recent Labs  04/09/14 0038  BNP 1385.0*    ProBNP (last 3 results) No results for input(s): PROBNP in the last 8760 hours.  CBG:  Recent Labs Lab 02/14/15 1438 02/17/15 1622  GLUCAP 99 73    Recent Results (from the past 240 hour(s))  MRSA PCR Screening     Status: None   Collection Time: 02/14/15  2:08 PM  Result Value Ref Range Status   MRSA by PCR NEGATIVE NEGATIVE Final    Comment:        The GeneXpert MRSA Assay (FDA approved for NASAL specimens only), is one component of a comprehensive MRSA colonization surveillance program. It is not intended to diagnose MRSA infection nor to guide or monitor treatment for MRSA infections.   Urine culture     Status: None   Collection Time: 02/14/15  2:10 PM  Result Value Ref Range Status   Specimen Description URINE, CATHETERIZED  Final   Special Requests NONE  Final   Culture   Final    >=100,000 COLONIES/mL ESCHERICHIA COLI Performed at [facility]    Report Status 02/17/2015 FINAL  Final   Organism ID, Bacteria ESCHERICHIA COLI  Final      Susceptibility   Escherichia coli - MIC*    AMPICILLIN >=32 RESISTANT Resistant     CEFAZOLIN <=4 SENSITIVE Sensitive     CEFTRIAXONE <=1 SENSITIVE Sensitive     CIPROFLOXACIN <=0.25 SENSITIVE Sensitive     GENTAMICIN <=1 SENSITIVE Sensitive     IMIPENEM <=0.25 SENSITIVE Sensitive     NITROFURANTOIN <=16 SENSITIVE Sensitive     TRIMETH/SULFA <=20 SENSITIVE Sensitive     AMPICILLIN/SULBACTAM 16 INTERMEDIATE Intermediate     PIP/TAZO <=4 SENSITIVE Sensitive     *  >=100,000 COLONIES/mL ESCHERICHIA COLI  Blood Culture (routine x 2)     Status: None   Collection Time: 02/14/15  2:30 PM  Result Value Ref Range Status   Specimen Description BLOOD LEFT ANTECUBITAL DRAWN BY RN  Final   Special Requests BOTTLES DRAWN AEROBIC ONLY [place]  Final   Culture NO GROWTH 6 DAYS  Final   Report Status 02/20/2015 FINAL  Final  Blood Culture (routine x 2)     Status: None   Collection Time: 02/15/15  3:28 AM  Result Value Ref Range Status   Specimen Description  BLOOD A-LINE DRAW DRAWN BY RN [place]  Final   Special Requests BOTTLES DRAWN AEROBIC AND ANAEROBIC [place]  Final   Culture NO GROWTH 5 DAYS  Final   Report Status 02/20/2015 FINAL  Final     Studies: Dg Chest Port 1v Same Day  02/19/2015  CLINICAL DATA:  Shortness of Breath EXAM: PORTABLE CHEST 1 VIEW COMPARISON:  02/14/2015 FINDINGS: Right jugular catheter is again identified at the cavoatrial junction. A defibrillator is again seen and stable. Cardiac shadow is stable. Increasing left retrocardiac density is noted with associated effusion. Mild atelectatic changes are noted in the right base. IMPRESSION: Increasing left retrocardiac infiltrate with associated small effusion. Mild right basilar density is noted as well. Electronically Signed   By: Inez Catalina M.D.   On: 02/19/2015 11:43    Scheduled Meds: . calcium carbonate (dosed in mg elemental calcium)  1,000 mg of elemental calcium Oral TID  . cefTRIAXone (ROCEPHIN)  IV  1 g Intravenous Q24H  . ezetimibe  10 mg Oral Daily  . furosemide  100 mg Intravenous BID  . hydrALAZINE  10 mg Oral BID  . levothyroxine  25 mcg Oral QAC breakfast  . pantoprazole  40 mg Oral Daily  . potassium chloride  40 mEq Oral BID  . rosuvastatin  40 mg Oral q1800  . sertraline  100 mg Oral Daily   Continuous Infusions:    Active Problems:   Hypertension   GERD (gastroesophageal reflux disease)   Chronic systolic CHF (congestive heart failure) (HCC)   H/O PAF    Cardiomyopathy, ischemic- EF 25-30% 2D 09/02/13   CAD- total LAD- residual OM2 disease   Suicide attempt Sept 2015   Hypothyroidism   S/P ICD (internal cardiac defibrillator) procedure, St. 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 Greater than 50% of this time was spent in direct contact with the patient coordinating care.    Lelon Frohlich  [facility] Pager [phone]  If 7PM-7AM, please contact night-coverage at [email or web address], password [email or web address] 02/21/2015, 11:26 AM  LOS: 7 days

## 2015-02-21 NOTE — Care Management Note (Signed)
Case Management Note  Patient Details  Name: [name] MRN: [id number] Date of Birth: Jun 23, 1955   Expected Discharge Date:                  Expected Discharge Plan:  Assisted Living / Rest Home  In-House Referral:  Clinical Social Work  Discharge planning Services  CM Consult  Post Acute Care Choice:  Home Health Choice offered to:  Patient  DME Arranged:    DME Agency:     HH Arranged:  RN [place] Agency:  [place]  Status of Service:  In process, will continue to follow  Medicare Important Message Given:    Date Medicare IM Given:    Medicare IM give by:    Date Additional Medicare IM Given:    Additional Medicare Important Message give by:     If discussed at [place] of Stay Meetings, dates discussed:  02/21/2015  Additional Comments:  [name], RN 02/21/2015, 3:13 PM

## 2015-02-21 NOTE — Clinical Documentation Improvement (Signed)
Internal Medicine  Can the diagnosis of anemia be further specified? Please document response in next progress note NOT in BPA drop down box. Thanks.   Iron deficiency Anemia  Nutritional anemia, including the nutrition or mineral deficits  Chronic Blood Loss Anemia, including the suspected or known cause  Acute Blood Loss Anemia  Anemia of chronic disease, including the associated chronic disease state  Other  Clinically Undetermined  Document any associated diagnoses/conditions.  Supporting Information:  Has been in hospital since 1/24; CKD3  Please exercise your independent, professional judgment when responding. A specific answer is not anticipated or expected.  Thank You,  Zoila Shutter RN, BSN, [place] [phone]

## 2015-02-22 ENCOUNTER — Other Ambulatory Visit: Payer: Self-pay | Admitting: Cardiology

## 2015-02-22 ENCOUNTER — Inpatient Hospital Stay (HOSPITAL_COMMUNITY): Payer: Medicaid Other

## 2015-02-22 DIAGNOSIS — Z515 Encounter for palliative care: Secondary | ICD-10-CM

## 2015-02-22 DIAGNOSIS — B9620 Unspecified Escherichia coli [E. coli] as the cause of diseases classified elsewhere: Secondary | ICD-10-CM | POA: Diagnosis present

## 2015-02-22 DIAGNOSIS — R0781 Pleurodynia: Secondary | ICD-10-CM | POA: Insufficient documentation

## 2015-02-22 DIAGNOSIS — N390 Urinary tract infection, site not specified: Secondary | ICD-10-CM | POA: Diagnosis present

## 2015-02-22 LAB — TYPE AND SCREEN
ABO/RH(D): A NEG
Antibody Screen: NEGATIVE
UNIT DIVISION: 0
UNIT DIVISION: 0

## 2015-02-22 LAB — BASIC METABOLIC PANEL
Anion gap: 11 (ref 5–15)
BUN: 60 mg/dL — AB (ref 6–20)
CALCIUM: 8.1 mg/dL — AB (ref 8.9–10.3)
CO2: 32 mmol/L (ref 22–32)
CREATININE: 5.44 mg/dL — AB (ref 0.44–1.00)
Chloride: 103 mmol/L (ref 101–111)
GFR calc non Af Amer: 8 mL/min — ABNORMAL LOW (ref >60)
GFR, EST AFRICAN AMERICAN: 9 mL/min — AB (ref >60)
Glucose, Bld: 81 mg/dL (ref 65–99)
Potassium: 4 mmol/L (ref 3.5–5.1)
SODIUM: 146 mmol/L — AB (ref 135–145)

## 2015-02-22 LAB — CBC
HEMATOCRIT: 29.7 % — AB (ref 36.0–46.0)
Hemoglobin: 10.2 g/dL — ABNORMAL LOW (ref 12.0–15.0)
MCH: 30.2 pg (ref 26.0–34.0)
MCHC: 34.3 g/dL (ref 30.0–36.0)
MCV: 87.9 fL (ref 78.0–100.0)
Platelets: 108 10*3/uL — ABNORMAL LOW (ref 150–400)
RBC: 3.38 MIL/uL — ABNORMAL LOW (ref 3.87–5.11)
RDW: 14.3 % (ref 11.5–15.5)
WBC: 9.2 10*3/uL (ref 4.0–10.5)

## 2015-02-22 LAB — PHOSPHORUS: Phosphorus: 3.8 mg/dL (ref 2.5–4.6)

## 2015-02-22 LAB — APTT: APTT: 41 s — AB (ref 24–37)

## 2015-02-22 MED ORDER — PANTOPRAZOLE SODIUM 40 MG PO TBEC
40.0000 mg | DELAYED_RELEASE_TABLET | Freq: Two times a day (BID) | ORAL | Status: DC
  Administered 2015-02-23 – 2015-02-24 (×3): 40 mg via ORAL
  Filled 2015-02-22 (×3): qty 1

## 2015-02-22 MED ORDER — CARVEDILOL 3.125 MG PO TABS
3.1250 mg | ORAL_TABLET | Freq: Two times a day (BID) | ORAL | Status: DC
  Administered 2015-02-22 – 2015-02-23 (×2): 3.125 mg via ORAL
  Filled 2015-02-22 (×2): qty 1

## 2015-02-22 NOTE — Progress Notes (Signed)
Physical Therapy Treatment Patient Details Name: Jahzeel Railsback MRN: [id number] DOB: 06-Jul-1955 Today's Date: 02/22/2015    History of Present Illness 60yo white female who comes to [facility] after 2 days of lethargy, AMS, and poor oral intake (food and fluids) s/p dental procedure PTA. Upon arrival pt found to have AKI, hypotensive, and hypothermic. PMH is dense with cardiac problems, including EF 25-30%, AICD, ICD, CAD, afib, ischemic cardiomyopathy, MI, chronic systolic heart failure, as well as ETOH abuse, SI, and CVA. At baseilne pt lives at a group home Indep in all ADL, adn ambulating limited community distances  (500-1050ft) without AD. Pt denies falls history prior to this episode. Recently transfused (1/31) after Hb: drop to 6.8. At eval not tolerateing  activity well, but today appears to have more strength, energy, and is at cognitive baseline.     PT Comments    Pt tolerating treatment session well, motivated and able to complete entire PT sesssion as planned. Pt continues to make progress toward goals as evidenced by improved mobility, activity tolerance, and ability to ambulate. Pt's greatest limitation continues to be high oxygen needs and poor O2 perfusion which continues to limit ability to perform al activity at baseline function. Patient presenting with impairment of strength, pain, range of motion, balance, and activity tolerance, limiting ability to perform ADL and mobility tasks at  baseline level of function. Patient will benefit from skilled intervention to address the above impairments and limitations, in order to restore to prior level of function, improve patient safety upon discharge, and to decrease caregiver burden.    Follow Up Recommendations  Home health PT;Supervision for mobility/OOB     Equipment Recommendations  Rolling walker with 5" wheels    Recommendations for Other Services       Precautions / Restrictions Precautions Precautions: None Restrictions Weight  Bearing Restrictions: No    Mobility  Bed Mobility Overal bed mobility: Modified Independent                Transfers Overall transfer level: Needs assistance Equipment used: 1 person hand held assist Transfers: Sit to/from Stand Sit to Stand: Supervision         General transfer comment: performed 6x for strengthening and balance, VSS.   Ambulation/Gait Ambulation/Gait assistance: Supervision Ambulation Distance (Feet): 80 Feet Assistive device: Rolling walker (2 wheeled)     Gait velocity interpretation: <1.8 ft/sec, indicative of risk for recurrent falls General Gait Details: slow, steady, cautious. No complaints, VSS.    Stairs            Wheelchair Mobility    Modified Rankin (Stroke Patients Only)       Balance Overall balance assessment: No apparent balance deficits (not formally assessed)                                  Cognition Arousal/Alertness: Awake/alert Behavior During Therapy: WFL for tasks assessed/performed Overall Cognitive Status: Within Functional Limits for tasks assessed Area of Impairment: Orientation                    Exercises Other Exercises Other Exercises: Seated LAQ 1x15 bilat with supervision, VSS Other Exercises: Seated hip flexion 1x15 bilat with supervision, VSS    General Comments        Pertinent Vitals/Pain Pain Assessment: No/denies pain    Home Living  Prior Function            PT Goals (current goals can now be found in the care plan section) Acute Rehab PT Goals Patient Stated Goal: Pt would like AMS to resolve, and to regain strength.  PT Goal Formulation: With patient Time For Goal Achievement: 03/03/15 Potential to Achieve Goals: Fair Progress towards PT goals: Progressing toward goals    Frequency  Min 3X/week    PT Plan Discharge plan needs to be updated    Co-evaluation             End of Session Equipment Utilized During  Treatment: Gait belt Activity Tolerance: Patient tolerated treatment well;No increased pain Patient left: with call bell/phone within reach;in chair;with chair alarm set     Time: [id number] PT Time Calculation (min) (ACUTE ONLY): 19 min  Charges:  $Therapeutic Activity: 8-22 mins                    G Codes:      9:50 AM, 03/17/15 Etta Grandchild, PT, DPT PRN Physical Therapist at [place] License # [id number] (wireless)  [phone] (mobile)

## 2015-02-22 NOTE — Progress Notes (Signed)
[name]  MRN: [id number]  DOB/AGE: 06-27-1955 60 y.o.  Primary Care Physician:FUNCHES, Lennox Laity, MD  Admit date: 02/14/2015  Chief Complaint:  Chief Complaint  Patient presents with  . Altered Mental Status    S-Pt presented on  02/14/2015 with  Chief Complaint  Patient presents with  . Altered Mental Status  .    Pt today feels better  Meds . calcium carbonate (dosed in mg elemental calcium)  1,000 mg of elemental calcium Oral TID  . carvedilol  3.125 mg Oral BID WC  . cefTRIAXone (ROCEPHIN)  IV  1 g Intravenous Q24H  . ezetimibe  10 mg Oral Daily  . furosemide  100 mg Intravenous BID  . hydrALAZINE  10 mg Oral BID  . levothyroxine  25 mcg Oral QAC breakfast  . pantoprazole  40 mg Oral Daily  . potassium chloride  40 mEq Oral BID  . rosuvastatin  40 mg Oral q1800  . sertraline  100 mg Oral Daily       Physical Exam: Vital signs in last 24 hours: Temp:  [97.3 F (36.3 C)-98.7 F (37.1 C)] 97.8 F (36.6 C) (02/01 0525) Pulse Rate:  [97-118] 118 (02/01 0944) Resp:  [16-20] 18 (02/01 0525) BP: (103-128)/(62-83) 128/75 mmHg (02/01 0525) SpO2:  [91 %-100 %] 96 % (02/01 0944) Weight:  [137 lb 3.2 oz (62.234 kg)] 137 lb 3.2 oz (62.234 kg) (02/01 0523) Weight change: -4 lb 14.4 oz (-2.223 kg) Last BM Date: 02/22/15  Intake/Output from previous day: 01/31 0701 - 02/01 0700 In: 2010 [P.O.:420; I.V.:250; Blood:1005] Out: [id number] [Urine:3250; Stool:1]     Physical Exam: General- pt is awake , alert, follows commands( Better than before)  Resp- No acute REsp distress, CTA B/L NO Rhonchi CVS- S1S2 regular in rate and rhythm GIT- BS+, soft, NT, ND EXT- NO LE Edema, NO Cyanosis   Lab Results: CBC  Recent Labs  02/21/15 0625 02/22/15 0535  WBC 6.2 9.2  HGB 6.8* 10.2*  HCT 19.7* 29.7*  PLT 98* 108*    BMET  Recent Labs  02/21/15 0625 02/22/15 0535  NA 140 146*  K 3.1* 4.0  CL 99* 103  CO2 30 32  GLUCOSE 104* 81  BUN 61* 60*  CREATININE 5.59*  5.44*  CALCIUM 7.7* 8.1*   Creat trend 2017 7.97=>6.51=>5.6=>5.44 2016 1.1--1.3 2015 1.1--2.0( AKI in July)     MICRO Recent Results (from the past 240 hour(s))  MRSA PCR Screening     Status: None   Collection Time: 02/14/15  2:08 PM  Result Value Ref Range Status   MRSA by PCR NEGATIVE NEGATIVE Final    Comment:        The GeneXpert MRSA Assay (FDA approved for NASAL specimens only), is one component of a comprehensive MRSA colonization surveillance program. It is not intended to diagnose MRSA infection nor to guide or monitor treatment for MRSA infections.   Urine culture     Status: None   Collection Time: 02/14/15  2:10 PM  Result Value Ref Range Status   Specimen Description URINE, CATHETERIZED  Final   Special Requests NONE  Final   Culture   Final    >=100,000 COLONIES/mL ESCHERICHIA COLI Performed at [facility]    Report Status 02/17/2015 FINAL  Final   Organism ID, Bacteria ESCHERICHIA COLI  Final      Susceptibility   Escherichia coli - MIC*    AMPICILLIN >=32 RESISTANT Resistant     CEFAZOLIN <=4 SENSITIVE Sensitive  CEFTRIAXONE <=1 SENSITIVE Sensitive     CIPROFLOXACIN <=0.25 SENSITIVE Sensitive     GENTAMICIN <=1 SENSITIVE Sensitive     IMIPENEM <=0.25 SENSITIVE Sensitive     NITROFURANTOIN <=16 SENSITIVE Sensitive     TRIMETH/SULFA <=20 SENSITIVE Sensitive     AMPICILLIN/SULBACTAM 16 INTERMEDIATE Intermediate     PIP/TAZO <=4 SENSITIVE Sensitive     * >=100,000 COLONIES/mL ESCHERICHIA COLI  Blood Culture (routine x 2)     Status: None   Collection Time: 02/14/15  2:30 PM  Result Value Ref Range Status   Specimen Description BLOOD LEFT ANTECUBITAL DRAWN BY RN  Final   Special Requests BOTTLES DRAWN AEROBIC ONLY 6CC  Final   Culture NO GROWTH 6 DAYS  Final   Report Status 02/20/2015 FINAL  Final  Blood Culture (routine x 2)     Status: None   Collection Time: 02/15/15  3:28 AM  Result Value Ref Range Status   Specimen  Description BLOOD A-LINE DRAW DRAWN BY RN JD  Final   Special Requests BOTTLES DRAWN AEROBIC AND ANAEROBIC 8CC EACH  Final   Culture NO GROWTH 5 DAYS  Final   Report Status 02/20/2015 FINAL  Final      Lab Results  Component Value Date   CALCIUM 8.1* 02/22/2015   CAION 1.13 10/03/2013   PHOS 3.8 02/22/2015               Impression: 1)Renal AKI secondary to Prerenal/ATN  AKi sec to Hypovolemia  Meds- RAAS blockersas outpt  Sepsis  AKi better                Creat trending down  AKI on CKD   CKD stage 3 .  CKD since 2015  CKD secondary to HTN/ CHF  Progression of CKD marked with AKI   2)CVs- hemodynamically stable   3)Anemia HGb low Primary team following Pt did receive 2 units PRBC yesterday  4)ID-admitted with sepsis  On IV abx   5) CHF- cardiology and Primary MD following  Pt on IV Diuretics  6)Electrolytes  Hypokalemic    Sec to diuresis     Now better  NOrmonatremic=> now minimally hypernatremic   7)Acid base Co2 at goal     Plan:  Will encourage free water Will follow bmet      [place] S 02/22/2015, 10:37 AM

## 2015-02-22 NOTE — Progress Notes (Signed)
Late entry:  Patient c/o chest discomfort 6/10.  States that it comes and goes and is worse with deep breathing. States it started "this morning when I had nausea".  Patient had Zofran this morning at 0626 per [facility].  Dr. Caryn Section notified via text page.

## 2015-02-22 NOTE — Progress Notes (Signed)
Nutrition Brief Note   Reason for Assessment: Length of Stay - 8 days  Height:   Ht Readings from Last 1 Encounters:  02/14/15 5\' 4"  (1.626 m)   Weight:   Wt Readings from Last 1 Encounters:  02/22/15 137 lb 3.2 oz (62.234 kg)   Assessment:  60 year old female who has a past medical history of chronic systolic heart failure, Ischemic cardiomyopathy, CAD, Persistent atrial fibrillation, Suicidal overdose (10/03/13), GERD, HLD, HTN, Hypothyroidism, Mass of right breast on mammogram (07/14/2014), AICD present, Myocardial infarction, Anginal pain, Anemia, History of blood transfusion, Stroke (2011), Depression, and S/P ICD procedure, St. Jude device (09/14/2014). Presented to the hospital with altered mental status. Patient resides at group home, 3 days ago had multiple teeth removed after that she was given pain medication and has been lethargic for the past 2.5 days. Patient had very poor by mouth intake and did not eat or drink anything. Was found to be lethargic and was brought to the hospital.   Body mass index is 23.54 kg/(m^2). Patient meets criteria for normal based on current BMI.  Patient was 120 lbs upon admission, which she reports to be her usual body weight.  Patient weight has trended upward due to edema with weights fluctuating between 120 - 151 lbs.    Patient is on a heart healthy/CHO modified diet.  Patient states that she does not salt anything and is so used to a lower sodium diet that she no longer enjoys certain items because they now taste too salty.  She consumed fruit and oatmeal for breakfast and squash and pork roast for lunch per patient report. Patient is consuming between 0 - 75% of her meals per chart review.  Patient offered and accepted for an afternoon snack to be ordered to ensure she would get necessary calories and protein.  Patient requested fruit and cottage cheese/yogurt.    Nutrition Focused Physical Exam completed.  Patient has no muscle depletion, no fat  depletion, and mild-moderate edema.    Labs reviewed. Medications: K-DUR, calcium carbonate, lasix.  No further nutrition interventions warranted at this time.  If nutrition issues arise, please consult RD.  Veronda Prude, Dietetic Intern Pager: [phone]

## 2015-02-22 NOTE — Progress Notes (Signed)
Patient remains hospitalized and not stable for dc back to [facility] yet. PT is recommending HHPT so patient will be able to return there with [facility] PT services.   Eduard Clos, MSW, [place]

## 2015-02-22 NOTE — Progress Notes (Addendum)
[facility] PROGRESS NOTE  [name] [id number] DOB: 1955/07/11 DOA: 02/14/2015 PCP: [name], MD    Code Status: DO NOT RESUSCITATE Family Communication: Discussed with patient; family not available Disposition Plan: Discharge when clinically appropriate, timeframe unknown.   Consultants:  Cardiology  Nephrology  Procedures:  2-D echocardiogram on 02/15/15: Study Conclusions - Left ventricle: Diffuse hypokinesis worse in the inferior wall and septum paradoxic motion ? from pacing The cavity size was moderately dilated. Wall thickness was normal. Systolic function was severely reduced. The estimated ejection fraction was in the range of 25% to 30%. - Atrial septum: No defect or patent foramen ovale was identified. - Pericardium, extracardiac: A trivial pericardial effusion was identified. - Impressions: No evidence of SBE or vegetation Impressions: - No evidence of SBE or vegetation  Antibiotics:  Rocephin  Cefepime and vancomycin, discontinued.  HPI/Subjective: Patient complained of mild pleuritic chest pain earlier. She was given pain medication with improvement. Chest x-ray was ordered with results noted below.  Objective: Filed Vitals:   02/22/15 0944 02/22/15 1412  BP:  115/60  Pulse: 118 110  Temp:  97.6 F (36.4 C)  Resp:  20   oxygen saturation 98% on supplemental oxygen.  Intake/Output Summary (Last 24 hours) at 02/22/15 1527 Last data filed at 02/22/15 0524  Gross per 24 hour  Intake   1550 ml  Output   2200 ml  Net   -650 ml   Filed Weights   02/20/15 0550 02/21/15 0500 02/22/15 0523  Weight: 68.7 kg (151 lb 7.3 oz) 64.456 kg (142 lb 1.6 oz) 62.234 kg (137 lb 3.2 oz)    Exam:   General:  Alert 60 year old woman in no acute distress.  Cardiovascular: Irregular, irregular.  Respiratory: Few scattered crackles; breathing nonlabored at rest.  Abdomen: Positive bowel sounds, soft, nontender,  nondistended.  Musculoskeletal/extremities: No pedal edema. No acute hot red joints.  Neurologic: She is alert and oriented 3. Cranial nerves II through XII are grossly intact.  Data Reviewed: Basic Metabolic Panel:  Recent Labs Lab 02/17/15 0300 02/18/15 0556 02/19/15 0939 02/20/15 0557 02/21/15 0625 02/22/15 0535  NA 143 139 135 139 140 146*  K 3.2* 3.6 5.3* 4.3 3.1* 4.0  CL 99* 96* 96* 98* 99* 103  CO2 28 26 23 24 30  32  GLUCOSE 64* 75 98 93 104* 81  BUN 65* 59* 55* 58* 61* 60*  CREATININE 6.34* 6.06* 5.38* 5.57* 5.59* 5.44*  CALCIUM 5.9* 6.1* 7.4* 7.8* 7.7* 8.1*  PHOS 5.0*  --   --   --   --  3.8   Liver Function Tests:  Recent Labs Lab 02/17/15 0300  AST 35  ALT 17  ALKPHOS 76  BILITOT 1.1  PROT 4.8*  ALBUMIN 2.0*   No results for input(s): LIPASE, AMYLASE in the last 168 hours. No results for input(s): AMMONIA in the last 168 hours. CBC:  Recent Labs Lab 02/18/15 0556 02/19/15 0451 02/20/15 0557 02/21/15 0625 02/22/15 0535  WBC 5.5 5.9 7.6 6.2 9.2  HGB 8.1* 8.5* 8.0* 6.8* 10.2*  HCT 22.9* 24.1* 23.3* 19.7* 29.7*  MCV 86.7 87.0 88.9 88.7 87.9  PLT 117* 110* 123* 98* 108*   Cardiac Enzymes: No results for input(s): CKTOTAL, CKMB, CKMBINDEX, TROPONINI in the last 168 hours. BNP (last 3 results)  Recent Labs  04/09/14 0038  BNP 1385.0*    ProBNP (last 3 results) No results for input(s): PROBNP in the last 8760 hours.  CBG:  Recent Labs Lab 02/17/15 1622  GLUCAP 73    Recent Results (from the past 240 hour(s))  MRSA PCR Screening     Status: None   Collection Time: 02/14/15  2:08 PM  Result Value Ref Range Status   MRSA by PCR NEGATIVE NEGATIVE Final    Comment:        The GeneXpert MRSA Assay (FDA approved for NASAL specimens only), is one component of a comprehensive MRSA colonization surveillance program. It is not intended to diagnose MRSA infection nor to guide or monitor treatment for MRSA infections.   Urine culture      Status: None   Collection Time: 02/14/15  2:10 PM  Result Value Ref Range Status   Specimen Description URINE, CATHETERIZED  Final   Special Requests NONE  Final   Culture   Final    >=100,000 COLONIES/mL ESCHERICHIA COLI Performed at [facility]    Report Status 02/17/2015 FINAL  Final   Organism ID, Bacteria ESCHERICHIA COLI  Final      Susceptibility   Escherichia coli - MIC*    AMPICILLIN >=32 RESISTANT Resistant     CEFAZOLIN <=4 SENSITIVE Sensitive     CEFTRIAXONE <=1 SENSITIVE Sensitive     CIPROFLOXACIN <=0.25 SENSITIVE Sensitive     GENTAMICIN <=1 SENSITIVE Sensitive     IMIPENEM <=0.25 SENSITIVE Sensitive     NITROFURANTOIN <=16 SENSITIVE Sensitive     TRIMETH/SULFA <=20 SENSITIVE Sensitive     AMPICILLIN/SULBACTAM 16 INTERMEDIATE Intermediate     PIP/TAZO <=4 SENSITIVE Sensitive     * >=100,000 COLONIES/mL ESCHERICHIA COLI  Blood Culture (routine x 2)     Status: None   Collection Time: 02/14/15  2:30 PM  Result Value Ref Range Status   Specimen Description BLOOD LEFT ANTECUBITAL DRAWN BY RN  Final   Special Requests BOTTLES DRAWN AEROBIC ONLY 6CC  Final   Culture NO GROWTH 6 DAYS  Final   Report Status 02/20/2015 FINAL  Final  Blood Culture (routine x 2)     Status: None   Collection Time: 02/15/15  3:28 AM  Result Value Ref Range Status   Specimen Description BLOOD A-LINE DRAW DRAWN BY RN Carter  Final   Special Requests BOTTLES DRAWN AEROBIC AND ANAEROBIC [place]  Final   Culture NO GROWTH 5 DAYS  Final   Report Status 02/20/2015 FINAL  Final     Studies: Dg Chest 2 View  02/22/2015  CLINICAL DATA:  Chest pain EXAM: CHEST  2 VIEW COMPARISON:  02/19/2015. FINDINGS: Bibasilar atelectasis or pneumonia again noted with improvement in basilar aeration bilaterally. Small bilateral pleural effusions persist. The cardio pericardial silhouette is enlarged. Right IJ central line tip overlies the mid SVC level. Single lead left-sided pacer/ AICD again noted.  Telemetry leads overlie the chest. Bones are diffusely demineralized. IMPRESSION: Interval improvement in basilar aeration.  Caps Electronically Signed   By: Misty Stanley M.D.   On: 02/22/2015 12:09    Scheduled Meds: . calcium carbonate (dosed in mg elemental calcium)  1,000 mg of elemental calcium Oral TID  . carvedilol  3.125 mg Oral BID WC  . cefTRIAXone (ROCEPHIN)  IV  1 g Intravenous Q24H  . ezetimibe  10 mg Oral Daily  . furosemide  100 mg Intravenous BID  . hydrALAZINE  10 mg Oral BID  . levothyroxine  25 mcg Oral QAC breakfast  . pantoprazole  40 mg Oral Daily  . potassium chloride  40 mEq Oral BID  . rosuvastatin  40 mg Oral q1800  .  sertraline  100 mg Oral Daily   Continuous Infusions:   Assessment and plan:  Principal Problem:   Sepsis ([place]) Active Problems:   Acute on chronic systolic (congestive) heart failure (HCC)   Cardiomyopathy, ischemic- EF 25-30% 2D 09/02/13   E. coli UTI   S/P ICD (internal cardiac defibrillator) procedure, [employer] device, 09/13/14   Hypertension   GERD (gastroesophageal reflux disease)   H/O PAF   CAD- total LAD- residual OM2 disease   Suicide attempt Sept 2015   Hypothyroidism   Acute renal failure (ARF) ([place])   Chronic anticoagulation-Eliquis   Palliative care encounter   DNR (do not resuscitate) discussion   1. Sepsis, secondary to Escherichia coli urinary tract infection. On admission, the patient was encephalopathic, hypothermic, hypotensive, with a grossly abnormal UA. Cultures were ordered and she was bolused with several liters of IV fluids and started on IV vancomycin and cefepime. Eventually, Neo-Synephrine had to be given. -Urine culture eventually grew out Escherichia coli, sensitive to ceftriaxone. Her blood pressure and hemodynamics improved. -We'll continue Rocephin for another 24-48 hours.  Acute on chronic systolic congestive heart failure with a history of ischemic cardiomyopathy. Patient has a history of systolic  and diastolic heart failure. She also has an AICD. On admission, Entresto, Aldactone, and Coreg were withheld secondary to hypotension. Following volume resuscitation, she developed acute on chronic heart failure. She was started on IV Lasix; it has been titrated up per nephrology due to her acute renal failure. -2-D echocardiogram on 02/15/15 revealed an EF of 25-30% and diffuse hypokinesis. -Cardiology was consulted. Small dosing of hydralazine was restarted. Crestor was restarted. Coreg was restarted today 02/22/15. -Patient is starting to diuresis and appears to be improving somewhat clinically. -Follow-up chest x-ray on 02/22/15 reveals interval improvement in basilar aeration and small bilateral pleural effusions.  Chronic paroxysmal atrial fibrillation. Patient is chronically anticoagulated on Eliquis. It was discontinued in the setting of acute renal failure and worsening anemia. Her rate has started to increase. Coreg was restarted on 02/22/15.  Acute respiratory failure with hypoxia. The patient became hypoxic. Her follow-up chest x-ray revealed pulmonary edema. She was started on supplemental oxygen and started on treatment for acute heart failure. -She appears to be improving slowly. Will decrease supplemental oxygen as tolerated.  Acute kidney injury superimposed on chronic kidney disease. Patient's baseline creatinine is 1.26. During hospitalization, it has increased progressively to a high of 6.4. Nephrology was consulted. IV Lasix was started and titrated up to 100 mg twice a day per nephrology. -Her urine output is improving. Her creatinine is slowly improving. -Etiology is suspected to be hypoperfusion from hypotension, sepsis, and ATN.  Acute on chronic anemia. 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 Oneida Alar 05/2014. Patient's hemoglobin was 10.4 on  admission. It gradually decreased, but significantly decreased to 6.8 on 02/21/15. Heparin was discontinued as was Eliquis earlier. She was transfused 2 units of packed red blood cells. Her hemoglobin improved appropriately. -Etiology of acute anemia is unknown, but could be secondary to hemodilution overall and decompensated renal function, rather than acute bleeding. -We'll increase Protonix to twice a day. We'll ask nursing to Hemoccult her stools.     Time spent: 35 minutes    [facility] Pager [phone]. If 7PM-7AM, please contact night-coverage at [email or web address], password [facility] 02/22/2015, 3:27 PM  LOS: 8 days

## 2015-02-22 NOTE — Progress Notes (Signed)
Primary cardiologist: Dr. [name]  Reason for follow-up: Systolic heart failure, PAF  Subjective:  Feeling better after a breathing treatment and transfusion.  Objective:  Vital Signs in the last 24 hours: Temp:  [97.3 F (36.3 C)-98.7 F (37.1 C)] 97.8 F (36.6 C) (02/01 0525) Pulse Rate:  [97-110] 108 (02/01 0525) Resp:  [16-20] 18 (02/01 0525) BP: (103-128)/(62-83) 128/75 mmHg (02/01 0525) SpO2:  [93 %-100 %] 100 % (02/01 0525) Weight:  [137 lb 3.2 oz (62.234 kg)] 137 lb 3.2 oz (62.234 kg) (02/01 0523)  Intake/Output from previous day: 01/31 0701 - 02/01 0700 In: 2010 [P.O.:420; I.V.:250; Blood:1005] Out: [id number] [Urine:3250; Stool:1]  Physical Exam: NECK: Without JVD, HJR, or bruit LUNGS: Decreased breath sounds with significant rhonchi HEART: Regular rate and rhythm at 110/m, no murmur, gallop, rub, bruit, thrill, or heave EXTREMITIES: Without cyanosis, clubbing, or edema  Lab Results:  Recent Labs  02/21/15 0625 02/22/15 0535  WBC 6.2 9.2  HGB 6.8* 10.2*  PLT 98* 108*    Recent Labs  02/21/15 0625 02/22/15 0535  NA 140 146*  K 3.1* 4.0  CL 99* 103  CO2 30 32  GLUCOSE 104* 81  BUN 61* 60*  CREATININE 5.59* 5.44*   Cardiac Studies: 2Decho 02/15/15: Study Conclusions  - Left ventricle: Diffuse hypokinesis worse in the inferior wall   and septum paradoxic motion ? from pacing The cavity size was   moderately dilated. Wall thickness was normal. Systolic function   was severely reduced. The estimated ejection fraction was in the   range of 25% to 30%. - Atrial septum: No defect or patent foramen ovale was identified. - Pericardium, extracardiac: A trivial pericardial effusion was   identified. - Impressions: No evidence of SBE or vegetation  Impressions:  - No evidence of SBE or vegetation  Assessment/Plan:   1. Acute on Chronic Systolic CHF: EF [id number] She has started to have increased urine output, currently on lasix 100 mg BID.  Negative 1241 overnight. Creatinine remains elevated. Has been off Entresto and Aldactone and Coreg since presenting with sepsis.  BP better after transfusion. With sinus tachycardia and history of PAF should probably resume low dose coreg. Was on 12.5 BID. Will start 6.25 BID.  2. Anemia: Significant drop in Hgb from 8.0 to 6.8. Back up to 10.2 after transfusion. Heparin stopped.  3. PAF: Remains in NSR, but sinus tachycardia. Continue to hold Eliquis.   4. Acute on CKD: Baseline creatinine 1.2-1.3, Creatinine remains elevated at 5.4. She being followed by Nephrology. No HD at this time.  5. ICM EF 25-30%  6. Sepsis presumed secondary to UTI   LOS: 8 days   Ermalinda Barrios 02/22/2015, 8:04 AM   Attending note:  Patient seen and examined. Modified above note by Ms. Bonnell Public PA-C. Ms. [place] states that she feels better today in general in terms of energy and shortness of breath. She continues to have improving urine output on high-dose Lasix although her creatinine has not improved very much, recently 5.4. She had 1200 cc out more than in the last 24 hours. Nephrology continues to follow. Systolic blood pressure has been ranging from 100-130 in sinus tachycardia predominantly by telemetry without recurrent PAF. Lungs exhibit decreased breath sounds but no crackles, she has no pitting edema distally. Hemoglobin up to 10.2 from 6.8 after PRBC transfusion. Platelets up to 108. At this time her cardiac regimen includes high-dose Lasix with potassium supplements, hydralazine, and Crestor. Agree with adding back Coreg at this  time.  Satira Sark, M.D., F.A.C.C.

## 2015-02-23 ENCOUNTER — Inpatient Hospital Stay (HOSPITAL_COMMUNITY): Payer: Medicaid Other

## 2015-02-23 DIAGNOSIS — N390 Urinary tract infection, site not specified: Secondary | ICD-10-CM

## 2015-02-23 DIAGNOSIS — R600 Localized edema: Secondary | ICD-10-CM | POA: Insufficient documentation

## 2015-02-23 DIAGNOSIS — R7981 Abnormal blood-gas level: Secondary | ICD-10-CM | POA: Insufficient documentation

## 2015-02-23 DIAGNOSIS — B9620 Unspecified Escherichia coli [E. coli] as the cause of diseases classified elsewhere: Secondary | ICD-10-CM

## 2015-02-23 DIAGNOSIS — R609 Edema, unspecified: Secondary | ICD-10-CM

## 2015-02-23 LAB — BASIC METABOLIC PANEL
Anion gap: 12 (ref 5–15)
BUN: 60 mg/dL — AB (ref 6–20)
CO2: 31 mmol/L (ref 22–32)
Calcium: 8.5 mg/dL — ABNORMAL LOW (ref 8.9–10.3)
Chloride: 100 mmol/L — ABNORMAL LOW (ref 101–111)
Creatinine, Ser: 5.56 mg/dL — ABNORMAL HIGH (ref 0.44–1.00)
GFR, EST AFRICAN AMERICAN: 9 mL/min — AB (ref >60)
GFR, EST NON AFRICAN AMERICAN: 8 mL/min — AB (ref >60)
Glucose, Bld: 80 mg/dL (ref 65–99)
POTASSIUM: 4.4 mmol/L (ref 3.5–5.1)
Sodium: 143 mmol/L (ref 135–145)

## 2015-02-23 LAB — CBC
HEMATOCRIT: 29.4 % — AB (ref 36.0–46.0)
HEMOGLOBIN: 9.9 g/dL — AB (ref 12.0–15.0)
MCH: 30.5 pg (ref 26.0–34.0)
MCHC: 33.7 g/dL (ref 30.0–36.0)
MCV: 90.5 fL (ref 78.0–100.0)
Platelets: 112 10*3/uL — ABNORMAL LOW (ref 150–400)
RBC: 3.25 MIL/uL — AB (ref 3.87–5.11)
RDW: 14.7 % (ref 11.5–15.5)
WBC: 7.3 10*3/uL (ref 4.0–10.5)

## 2015-02-23 LAB — APTT: aPTT: 38 seconds — ABNORMAL HIGH (ref 24–37)

## 2015-02-23 LAB — OCCULT BLOOD X 1 CARD TO LAB, STOOL: FECAL OCCULT BLD: POSITIVE — AB

## 2015-02-23 MED ORDER — TORSEMIDE 20 MG PO TABS
40.0000 mg | ORAL_TABLET | Freq: Every day | ORAL | Status: DC
  Administered 2015-02-23 – 2015-02-24 (×2): 40 mg via ORAL
  Filled 2015-02-23 (×2): qty 2

## 2015-02-23 MED ORDER — CALCIUM CARBONATE 1250 (500 CA) MG PO TABS
1.0000 | ORAL_TABLET | Freq: Three times a day (TID) | ORAL | Status: DC
  Administered 2015-02-23 – 2015-02-24 (×3): 500 mg via ORAL
  Filled 2015-02-23 (×3): qty 1

## 2015-02-23 MED ORDER — CARVEDILOL 3.125 MG PO TABS
6.2500 mg | ORAL_TABLET | Freq: Two times a day (BID) | ORAL | Status: DC
  Administered 2015-02-23 – 2015-02-24 (×2): 6.25 mg via ORAL
  Filled 2015-02-23 (×2): qty 2

## 2015-02-23 NOTE — Progress Notes (Signed)
ICM remote transmission rescheduled for 03/08/2015 due to patient currently hospitalized.

## 2015-02-23 NOTE — Progress Notes (Signed)
Fecal occult test was positive.  Dr. Caryn Section notified via text page.

## 2015-02-23 NOTE — Progress Notes (Signed)
[facility] notified requesting if patient can have telemetry monitoring d/c d/t recent code status change to DNR.

## 2015-02-23 NOTE — Progress Notes (Signed)
[facility] PROGRESS NOTE  [name] [id number] DOB: 1955/10/02 DOA: 02/14/2015 PCP: [name], MD    Code Status: DO NOT RESUSCITATE Family Communication: Discussed with patient; family not available Disposition Plan: Discharge when clinically appropriate, timeframe unknown.   Consultants:  Cardiology  Nephrology  Procedures:  2-D echocardiogram on 02/15/15: Study Conclusions - Left ventricle: Diffuse hypokinesis worse in the inferior wall and septum paradoxic motion ? from pacing The cavity size was moderately dilated. Wall thickness was normal. Systolic function was severely reduced. The estimated ejection fraction was in the range of 25% to 30%. - Atrial septum: No defect or patent foramen ovale was identified. - Pericardium, extracardiac: A trivial pericardial effusion was identified. - Impressions: No evidence of SBE or vegetation Impressions: - No evidence of SBE or vegetation  Antibiotics:  Rocephin  Cefepime and vancomycin, discontinued.  HPI/Subjective: Patient feels better. Her only complaint is of left arm swelling which she says started sometime when she was in the ICU.  Objective: Filed Vitals:   02/23/15 0653 02/23/15 0953  BP: 114/68   Pulse: 90 102  Temp: 98 F (36.7 C)   Resp: 20    oxygen saturation 96% on supplemental oxygen.  Intake/Output Summary (Last 24 hours) at 02/23/15 1351 Last data filed at 02/23/15 0954  Gross per 24 hour  Intake    120 ml  Output   2000 ml  Net  -1880 ml   Filed Weights   02/21/15 0500 02/22/15 0523 02/23/15 0653  Weight: 64.456 kg (142 lb 1.6 oz) 62.234 kg (137 lb 3.2 oz) 61.8 kg (136 lb 3.9 oz)    Exam:   General:  Alert 60 year old woman in no acute distress.  Cardiovascular: Irregular, irregular.  Respiratory: Breathing nonlabored. Lungs are clear anteriorly.  Abdomen: Positive bowel sounds, soft, nontender, nondistended.  Musculoskeletal/extremities: Left upper  extremity with edema above the antecubital area, mildly tender, no significant erythema. No pedal edema. No acute hot red joints.  Neurologic: She is alert and oriented 3. Cranial nerves II through XII are grossly intact.  Data Reviewed: Basic Metabolic Panel:  Recent Labs Lab 02/17/15 0300  02/19/15 0939 02/20/15 0557 02/21/15 0625 02/22/15 0535 02/23/15 0534  NA 143  < > 135 139 140 146* 143  K 3.2*  < > 5.3* 4.3 3.1* 4.0 4.4  CL 99*  < > 96* 98* 99* 103 100*  CO2 28  < > 23 24 30  32 31  GLUCOSE 64*  < > 98 93 104* 81 80  BUN 65*  < > 55* 58* 61* 60* 60*  CREATININE 6.34*  < > 5.38* 5.57* 5.59* 5.44* 5.56*  CALCIUM 5.9*  < > 7.4* 7.8* 7.7* 8.1* 8.5*  PHOS 5.0*  --   --   --   --  3.8  --   < > = values in this interval not displayed. Liver Function Tests:  Recent Labs Lab 02/17/15 0300  AST 35  ALT 17  ALKPHOS 76  BILITOT 1.1  PROT 4.8*  ALBUMIN 2.0*   No results for input(s): LIPASE, AMYLASE in the last 168 hours. No results for input(s): AMMONIA in the last 168 hours. CBC:  Recent Labs Lab 02/19/15 0451 02/20/15 0557 02/21/15 0625 02/22/15 0535 02/23/15 0534  WBC 5.9 7.6 6.2 9.2 7.3  HGB 8.5* 8.0* 6.8* 10.2* 9.9*  HCT 24.1* 23.3* 19.7* 29.7* 29.4*  MCV 87.0 88.9 88.7 87.9 90.5  PLT 110* 123* 98* 108* 112*   Cardiac Enzymes: No results for  input(s): CKTOTAL, CKMB, CKMBINDEX, TROPONINI in the last 168 hours. BNP (last 3 results)  Recent Labs  04/09/14 0038  BNP 1385.0*    ProBNP (last 3 results) No results for input(s): PROBNP in the last 8760 hours.  CBG:  Recent Labs Lab 02/17/15 1622  GLUCAP 73    Recent Results (from the past 240 hour(s))  MRSA PCR Screening     Status: None   Collection Time: 02/14/15  2:08 PM  Result Value Ref Range Status   MRSA by PCR NEGATIVE NEGATIVE Final    Comment:        The GeneXpert MRSA Assay (FDA approved for NASAL specimens only), is one component of a comprehensive MRSA  colonization surveillance program. It is not intended to diagnose MRSA infection nor to guide or monitor treatment for MRSA infections.   Urine culture     Status: None   Collection Time: 02/14/15  2:10 PM  Result Value Ref Range Status   Specimen Description URINE, CATHETERIZED  Final   Special Requests NONE  Final   Culture   Final    >=100,000 COLONIES/mL ESCHERICHIA COLI Performed at [facility]    Report Status 02/17/2015 FINAL  Final   Organism ID, Bacteria ESCHERICHIA COLI  Final      Susceptibility   Escherichia coli - MIC*    AMPICILLIN >=32 RESISTANT Resistant     CEFAZOLIN <=4 SENSITIVE Sensitive     CEFTRIAXONE <=1 SENSITIVE Sensitive     CIPROFLOXACIN <=0.25 SENSITIVE Sensitive     GENTAMICIN <=1 SENSITIVE Sensitive     IMIPENEM <=0.25 SENSITIVE Sensitive     NITROFURANTOIN <=16 SENSITIVE Sensitive     TRIMETH/SULFA <=20 SENSITIVE Sensitive     AMPICILLIN/SULBACTAM 16 INTERMEDIATE Intermediate     PIP/TAZO <=4 SENSITIVE Sensitive     * >=100,000 COLONIES/mL ESCHERICHIA COLI  Blood Culture (routine x 2)     Status: None   Collection Time: 02/14/15  2:30 PM  Result Value Ref Range Status   Specimen Description BLOOD LEFT ANTECUBITAL DRAWN BY RN  Final   Special Requests BOTTLES DRAWN AEROBIC ONLY 6CC  Final   Culture NO GROWTH 6 DAYS  Final   Report Status 02/20/2015 FINAL  Final  Blood Culture (routine x 2)     Status: None   Collection Time: 02/15/15  3:28 AM  Result Value Ref Range Status   Specimen Description BLOOD A-LINE DRAW DRAWN BY RN [place]  Final   Special Requests BOTTLES DRAWN AEROBIC AND ANAEROBIC [place]  Final   Culture NO GROWTH 5 DAYS  Final   Report Status 02/20/2015 FINAL  Final     Studies: Dg Chest 2 View  02/22/2015  CLINICAL DATA:  Chest pain EXAM: CHEST  2 VIEW COMPARISON:  02/19/2015. FINDINGS: Bibasilar atelectasis or pneumonia again noted with improvement in basilar aeration bilaterally. Small bilateral pleural effusions  persist. The cardio pericardial silhouette is enlarged. Right IJ central line tip overlies the mid SVC level. Single lead left-sided pacer/ AICD again noted. Telemetry leads overlie the chest. Bones are diffusely demineralized. IMPRESSION: Interval improvement in basilar aeration.  Caps Electronically Signed   By: Misty Stanley M.D.   On: 02/22/2015 12:09    Scheduled Meds: . calcium carbonate (dosed in mg elemental calcium)  1,000 mg of elemental calcium Oral TID  . carvedilol  6.25 mg Oral BID WC  . cefTRIAXone (ROCEPHIN)  IV  1 g Intravenous Q24H  . ezetimibe  10 mg Oral Daily  .  hydrALAZINE  10 mg Oral BID  . levothyroxine  25 mcg Oral QAC breakfast  . pantoprazole  40 mg Oral BID AC  . rosuvastatin  40 mg Oral q1800  . sertraline  100 mg Oral Daily  . torsemide  40 mg Oral Daily   Continuous Infusions:   Assessment and plan:  Principal Problem:   Sepsis ([place]) Active Problems:   Acute on chronic systolic (congestive) heart failure (HCC)   Cardiomyopathy, ischemic- EF 25-30% 2D 09/02/13   E. coli UTI   S/P ICD (internal cardiac defibrillator) procedure, [employer] device, 09/13/14   Hypertension   GERD (gastroesophageal reflux disease)   H/O PAF   CAD- total LAD- residual OM2 disease   Suicide attempt Sept 2015   Hypothyroidism   Acute renal failure (ARF) ([place])   Chronic anticoagulation-Eliquis   Palliative care encounter   DNR (do not resuscitate) discussion   Pleuritic chest pain   1. Sepsis, secondary to Escherichia coli urinary tract infection. On admission, the patient was encephalopathic, hypothermic, hypotensive, and with a grossly abnormal UA. Cultures were ordered and she was bolused with several liters of IV fluids and started on IV vancomycin and cefepime. Eventually, Neo-Synephrine had to be given. -Urine culture eventually grew out Escherichia coli, sensitive to ceftriaxone. Her blood pressure and hemodynamics improved. -We'll continue Rocephin for another 24-48  hours.  Acute on chronic systolic congestive heart failure with a history of ischemic cardiomyopathy. Patient has a history of systolic and diastolic heart failure. She also has an AICD. On admission, Entresto, Aldactone, and Coreg were withheld secondary to hypotension. Following volume resuscitation, she developed acute on chronic heart failure. She was started on IV Lasix; it was titrated up per nephrology due to her acute renal failure. -2-D echocardiogram on 02/15/15 revealed an EF of 25-30% and diffuse hypokinesis. -Cardiology was consulted. Small dosing of hydralazine was restarted. Crestor was restarted. Coreg was restarted on 02/22/15 following stabilization of her blood pressure.. -Patient started diuresing better, but urine output has dropped off some. Dr. Lowanda Foster discontinued IV Lasix in favor of oral Demadex on 02/23/15. -Follow-up chest x-ray on 02/22/15 revealed interval improvement in basilar aeration and small bilateral pleural effusions.  Chronic paroxysmal atrial fibrillation. Patient is chronically anticoagulated on Eliquis. It was discontinued in the setting of acute renal failure and worsening anemia. Her rate has started to increase. Coreg was restarted on 02/22/15 and titrated up today by cardiology.  Acute respiratory failure with hypoxia. The patient became hypoxic. Her follow-up chest x-ray revealed pulmonary edema. She was started on supplemental oxygen and started on treatment for acute heart failure. -She appears to be clinically improved as she has been ambulating with supplemental oxygen. Will decrease supplemental oxygen as tolerated.  Acute kidney injury superimposed on chronic kidney disease. Patient's baseline creatinine is 1.26. During the hospitalization, it had increased progressively to a high of 6.4. Nephrology was consulted. IV Lasix was started and titrated up to 100 mg twice a day per nephrology. -Her urine output is improving. Her creatinine is slowly  improving. -Etiology is suspected to be hypoperfusion from hypotension, sepsis, and ATN.  Acute on chronic anemia. 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 Oneida Alar 05/2014. Patient's hemoglobin was 10.4 on admission. It gradually decreased, but significantly decreased to 6.8 on 02/21/15. Heparin was discontinued as was Eliquis earlier. She was transfused 2 units of packed red blood cells. Her  hemoglobin improved appropriately. -Etiology of acute anemia is unknown, but could be secondary to hemodilution overall and decompensated renal function, rather than acute bleeding. - Protonix was increased to twice a day. Will Hemoccult her stools.  Hypothyroidism. Patient was continued on Synthroid. Her TSH was within normal limits.  Left upper extremity edema. Patient had a previous IV in her left upper extremity. The edema appears to be more infiltrative and/or with mild thrombophlebitis. -We'll order warm compresses and a venous ultrasound rule out DVT.   Time spent: 30 minutes    [facility] Pager [phone]. If 7PM-7AM, please contact night-coverage at [email or web address], password [facility] 02/23/2015, 1:51 PM  LOS: 9 days

## 2015-02-23 NOTE — Progress Notes (Signed)
Primary Cardiologist: [name]  Cardiology Specific Problem List: 1. Acute on Chronic Systlic CHF 2. PAF 3. ICM  Subjective:    Feels a lot better today. Breathing improved, has been walking in the hallway. Appetite improved.   Objective:   Temp:  [97.6 F (36.4 C)-98.7 F (37.1 C)] 98 F (36.7 C) (02/02 0653) Pulse Rate:  [90-118] 90 (02/02 0653) Resp:  [20] 20 (02/02 0653) BP: (107-115)/(60-79) 114/68 mmHg (02/02 0653) SpO2:  [95 %-100 %] 100 % (02/02 0653) Weight:  [136 lb 3.9 oz (61.8 kg)] 136 lb 3.9 oz (61.8 kg) (02/02 0653) Last BM Date: 02/23/15  Filed Weights   02/21/15 0500 02/22/15 0523 02/23/15 0653  Weight: 142 lb 1.6 oz (64.456 kg) 137 lb 3.2 oz (62.234 kg) 136 lb 3.9 oz (61.8 kg)    Intake/Output Summary (Last 24 hours) at 02/23/15 0930 Last data filed at 02/22/15 1313  Gross per 24 hour  Intake     50 ml  Output    300 ml  Net   -250 ml    Telemetry: Sinus rhythm and tachycardia rates up to 100-110 bpm with intraventricular conduction delay.  Exam:  General: No acute distress.  Lungs: Clear to auscultation, nonlabored. Mild crackles in the bases cleared with cough  Cardiac: Right IJ in place. RRR tachycardic, no gallop or rub.   Abdomen: Normoactive bowel sounds, nontender, nondistended.  Extremities: No pitting edema, distal pulses full.  Lab Results:  Basic Metabolic Panel:  Recent Labs Lab 02/21/15 0625 02/22/15 0535 02/23/15 0534  NA 140 146* 143  K 3.1* 4.0 4.4  CL 99* 103 100*  CO2 30 32 31  GLUCOSE 104* 81 80  BUN 61* 60* 60*  CREATININE 5.59* 5.44* 5.56*  CALCIUM 7.7* 8.1* 8.5*    CBC:  Recent Labs Lab 02/21/15 0625 02/22/15 0535 02/23/15 0534  WBC 6.2 9.2 7.3  HGB 6.8* 10.2* 9.9*  HCT 19.7* 29.7* 29.4*  MCV 88.7 87.9 90.5  PLT 98* 108* 112*   Radiology: Dg Chest 2 View  02/22/2015  CLINICAL DATA:  Chest pain EXAM: CHEST  2 VIEW COMPARISON:  02/19/2015. FINDINGS: Bibasilar atelectasis or pneumonia  again noted with improvement in basilar aeration bilaterally. Small bilateral pleural effusions persist. The cardio pericardial silhouette is enlarged. Right IJ central line tip overlies the mid SVC level. Single lead left-sided pacer/ AICD again noted. Telemetry leads overlie the chest. Bones are diffusely demineralized. IMPRESSION: Interval improvement in basilar aeration.  Caps Electronically Signed   By: Misty Stanley M.D.   On: 02/22/2015 12:09    Medications:   Scheduled Medications: . calcium carbonate (dosed in mg elemental calcium)  1,000 mg of elemental calcium Oral TID  . carvedilol  3.125 mg Oral BID WC  . cefTRIAXone (ROCEPHIN)  IV  1 g Intravenous Q24H  . ezetimibe  10 mg Oral Daily  . furosemide  100 mg Intravenous BID  . hydrALAZINE  10 mg Oral BID  . levothyroxine  25 mcg Oral QAC breakfast  . pantoprazole  40 mg Oral BID AC  . potassium chloride  40 mEq Oral BID  . rosuvastatin  40 mg Oral q1800  . sertraline  100 mg Oral Daily    PRN Medications: acetaminophen, albuterol, ondansetron **OR** ondansetron (ZOFRAN) IV, promethazine, zolpidem   Assessment and Plan:   1. Acute on Chronic Systolic CHF: She is better clinically compensated. She has not diuresed much overnight, but weight it down from highest recorded of 151 to 136  lbs. Still off Entresto and spironolactone. She has been started back on carvedilol 3.25 mg BID. Also on Hydralazine. BP is stable now.   2.AKI: Creatinine is stable, no significant improvement, but no deterioration. Dr. Hinda Lenis taking a watch and wait course and will follow up with her as OP. He does not think she needs dialysis at this time.   3. Anemia: Improved after blood transfusion. Hgb is now 9.9 from 10.2 after transfusion. She is markedly improved as far as energy and breathing status.   4. PAF: In NSR. Still holding anticoagulation.   5. ICM: EF 25-30%:  Continue conservative management adding back medications as kidney function  improves.   Phill Myron. Lawrence NP [place]  02/23/2015, 9:30 AM   Attending note:  Patient seen and examined. Modified above note by Ms. Lawrence NP. Patient continues to improve clinically. Urine output has dropped off somewhat, and her creatinine is stabilized around 5.5. Still no acute indication for hemodialysis per Dr. Lowanda Foster. Breathing is comfortable at rest, no crackles on exam. Cardiac with RRR as before, no S3 gallop. She has had no PAF by telemetry. Diuretic has been switched to Demadex from IV Lasix. She did tolerate the addition of Coreg yesterday. We will advance Coreg dose to 6.25 mg twice daily, otherwise continue hydralazine.  Satira Sark, M.D., F.A.C.C.

## 2015-02-23 NOTE — Care Management Note (Signed)
Case Management Note  Patient Details  Name: [name] MRN: [id number] Date of Birth: 10-15-1955  Expected Discharge Date:                  Expected Discharge Plan:  Assisted Living / Rest Home  In-House Referral:  Clinical Social Work  Discharge planning Services  CM Consult  Post Acute Care Choice:  Home Health Choice offered to:  Patient  DME Arranged:    DME Agency:     HH Arranged:  RN [place] Agency:  [facility]  Status of Service:  In process, will continue to follow  Medicare Important Message Given:    Date Medicare IM Given:    Medicare IM give by:    Date Additional Medicare IM Given:    Additional Medicare Important Message give by:     If discussed at [place] of Stay Meetings, dates discussed: 02/23/2015  Additional Comments:  [name], RN 02/23/2015, 2:18 PM

## 2015-02-23 NOTE — Progress Notes (Signed)
Subjective: Patient is feeling much better. Presently her difficulty breathing has improved. Patient was able to walk around. No nausea or vomiting.  medical contraindication.   Objective: Vital signs in last 24 hours: Temp:  [97.6 F (36.4 C)-98.7 F (37.1 C)] 98 F (36.7 C) (02/02 0653) Pulse Rate:  [90-110] 102 (02/02 0953) Resp:  [20] 20 (02/02 0653) BP: (107-115)/(60-79) 114/68 mmHg (02/02 0653) SpO2:  [95 %-100 %] 100 % (02/02 0653) Weight:  [136 lb 3.9 oz (61.8 kg)] 136 lb 3.9 oz (61.8 kg) (02/02 0653)  Intake/Output from previous day: 02/01 0701 - 02/02 0700 In: 110 [IV Piggyback:110] Out: 300 [Urine:300] Intake/Output this shift: Total I/O In: 60 [IV Piggyback:60] Out: -    Recent Labs  02/21/15 0625 02/22/15 0535 02/23/15 0534  HGB 6.8* 10.2* 9.9*    Recent Labs  02/22/15 0535 02/23/15 0534  WBC 9.2 7.3  RBC 3.38* 3.25*  HCT 29.7* 29.4*  PLT 108* 112*    Recent Labs  02/22/15 0535 02/23/15 0534  NA 146* 143  K 4.0 4.4  CL 103 100*  CO2 32 31  BUN 60* 60*  CREATININE 5.44* 5.56*  GLUCOSE 81 80  CALCIUM 8.1* 8.5*   No results for input(s): LABPT, INR in the last 72 hours.  Generally patient is alert and in no apparent distress Chest: She is clear Heart exam regular rate and rhythm no murmur Abdomen: Positive bowel sounds Extremities no edema  Assessment/Plan: Problem #1 acute kidney injury superimposed on chronic. Etiology was thought to be secondary to combination of ATN/prerenal/RAAS. Her renal function remains stable. Presently she doesn't have any uremic signs or symptoms. Problem #3 history of ischemic cardiomyopathy with low ejection fraction. She is also status post AICD placement. Her urine output has improved. Chest x-ray also shows some sign of improvement. Presently patient is becoming less symptomatic. Her weight has gone down from the high of 151 to present level of 136. Problem #4 anemia: Her hemoglobin is low but  stable. Problem #5 history of hypertension: Her blood pressure is presently was in acceptable range however she has episode of hypotension. Problem #6 history of chronic renal failure. Patient creatinine was 1.26 with a GFR of 46 mL/m about 4 weeks ago on 01/18/2015. That's his stage III chronic renal failure Problem #7 hypocalcemia: Patient is on calcium supplement. Her calcium has corrected. Problem#8 Hypokalmia: Potassium is normal. Plan: 1] we'll DC Lasix 2] we'll start her Demadex 40 mg once a day. 3] we'll check basic metabolic panel tomorrow. 4] we'll DC potassium.   Shae Augello S 02/23/2015, 9:56 AM

## 2015-02-24 DIAGNOSIS — A4151 Sepsis due to Escherichia coli [E. coli]: Principal | ICD-10-CM

## 2015-02-24 DIAGNOSIS — E038 Other specified hypothyroidism: Secondary | ICD-10-CM

## 2015-02-24 LAB — BASIC METABOLIC PANEL
ANION GAP: 9 (ref 5–15)
BUN: 59 mg/dL — ABNORMAL HIGH (ref 6–20)
CALCIUM: 8.5 mg/dL — AB (ref 8.9–10.3)
CO2: 31 mmol/L (ref 22–32)
Chloride: 105 mmol/L (ref 101–111)
Creatinine, Ser: 5.48 mg/dL — ABNORMAL HIGH (ref 0.44–1.00)
GFR, EST AFRICAN AMERICAN: 9 mL/min — AB (ref >60)
GFR, EST NON AFRICAN AMERICAN: 8 mL/min — AB (ref >60)
Glucose, Bld: 91 mg/dL (ref 65–99)
Potassium: 5.6 mmol/L — ABNORMAL HIGH (ref 3.5–5.1)
Sodium: 145 mmol/L (ref 135–145)

## 2015-02-24 LAB — APTT: APTT: 37 s (ref 24–37)

## 2015-02-24 MED ORDER — TORSEMIDE 20 MG PO TABS: 40.0000 mg | ORAL_TABLET | Freq: Every day | ORAL | Status: DC

## 2015-02-24 MED ORDER — ZOLPIDEM TARTRATE 10 MG PO TABS: 10.0000 mg | ORAL_TABLET | Freq: Every evening | ORAL | Status: DC | PRN

## 2015-02-24 MED ORDER — CALCIUM CARBONATE 1250 (500 CA) MG PO TABS: 1.0000 | ORAL_TABLET | Freq: Three times a day (TID) | ORAL | Status: AC

## 2015-02-24 MED ORDER — CARVEDILOL 12.5 MG PO TABS: 12.5000 mg | ORAL_TABLET | Freq: Two times a day (BID) | ORAL | Status: DC

## 2015-02-24 MED ORDER — SODIUM POLYSTYRENE SULFONATE 15 GM/60ML PO SUSP
30.0000 g | Freq: Once | ORAL | Status: AC
  Administered 2015-02-24: 30 g via ORAL
  Filled 2015-02-24: qty 120

## 2015-02-24 MED ORDER — HYDRALAZINE HCL 10 MG PO TABS
10.0000 mg | ORAL_TABLET | Freq: Two times a day (BID) | ORAL | Status: DC
Start: 2015-02-24 — End: 2015-03-29

## 2015-02-24 NOTE — Progress Notes (Signed)
Daily Progress Note   Patient Name: [name]       Date: 02/24/2015 DOB: [birth year]  Age: 60 y.o. MRN#: [id number] Attending Physician: Rexene Alberts, MD Primary Care Physician: [name], MD Admit Date: 02/14/2015  Reason for Consultation/Follow-up: Disposition, Establishing goals of care and Psychosocial/spiritual support  Subjective: [name] is sitting up in bed watching TV.   She smiles and greets me.  We talk about her health improvements and her outlook for the future.  She shares that she knows she still has chronic health problems that will never go away, but that she is not going to dwell on them.  We talk about following up with her doctors and she tells me that appointments are already made.   We talk about advanced directives and she shares that she has not changed her mind, she still wants to allow a natural death. I encourage her to revisit this often and to also consider if she would or would not want to have hemodialysis. I ask if she has been able to smooth things over with her sister, she smiles and states she has. She shares that she will get new upper and lower dentures soon.  That she feels she had a UTI and it spread when she had dental work.  [name] is pleasant and, I feel, realistic about her health.   Length of Stay: 10 days  Current Medications: Scheduled Meds:  . calcium carbonate  1 tablet Oral TID WC  . carvedilol  12.5 mg Oral BID WC  . cefTRIAXone (ROCEPHIN)  IV  1 g Intravenous Q24H  . ezetimibe  10 mg Oral Daily  . hydrALAZINE  10 mg Oral BID  . levothyroxine  25 mcg Oral QAC breakfast  . pantoprazole  40 mg Oral BID AC  . rosuvastatin  40 mg Oral q1800  . sertraline  100 mg Oral Daily  . torsemide  40 mg Oral Daily    Continuous  Infusions:    PRN Meds: acetaminophen, albuterol, ondansetron **OR** ondansetron (ZOFRAN) IV, promethazine, zolpidem  Physical Exam: Physical Exam  Constitutional: She is oriented to person, place, and time. No distress.  HENT:  Head: Normocephalic and atraumatic.  Cardiovascular: Normal rate.   Pulmonary/Chest: Effort normal.  Abdominal: Soft. There is no tenderness.  Neurological: She is  alert and oriented to person, place, and time.  Skin: Skin is warm and dry.  Nursing note and vitals reviewed.               Vital Signs: BP 108/76 mmHg  Pulse 105  Temp(Src) 98.9 F (37.2 C) (Oral)  Resp 20  Ht 5\' 4"  (1.626 m)  Wt 61 kg (134 lb 7.7 oz)  BMI 23.07 kg/m2  SpO2 93%  LMP 01/22/1988 (Approximate) SpO2: SpO2: 93 % (resting) O2 Device: O2 Device: Not Delivered O2 Flow Rate: O2 Flow Rate (L/min): 2 L/min  Intake/output summary:  Intake/Output Summary (Last 24 hours) at 02/24/15 1407 Last data filed at 02/24/15 0800  Gross per 24 hour  Intake    240 ml  Output      0 ml  Net    240 ml   LBM: Last BM Date: 02/24/15 Baseline Weight: Weight: 54.432 kg (120 lb) Most recent weight: Weight: 61 kg (134 lb 7.7 oz)       Palliative Assessment/Data: Flowsheet Rows        Most Recent Value   Intake Tab    Referral Department  Hospitalist   Unit at Time of Referral  Med/Surg Unit   Palliative Care Primary Diagnosis  Nephrology   Date Notified  02/20/15   Palliative Care Type  New Palliative care   Reason for referral  Psychosocial or Spiritual support, Clarify Goals of Care, Advance Care Planning   Date of Admission  02/14/15   Date first seen by Palliative Care  02/20/15   # of days Palliative referral response time  0 Day(s)   # of days IP prior to Palliative referral  6   Clinical Assessment    Palliative Performance Scale Score  30%   Pain Max last 24 hours  Other (Comment)   Pain Min Last 24 hours  Other (Comment)   Dyspnea Max Last 24 Hours  Other (Comment)    Dyspnea Min Last 24 hours  Other (Comment)   Psychosocial & Spiritual Assessment    Social Work Plan of Care  Staff support   Palliative Care Outcomes    Patient/Family meeting held?  Yes   Who was at the meeting?  patient only today   Palliative Care Outcomes  Provided end of life care assistance, Provided advance care planning, Provided psychosocial or spiritual support   Palliative Care follow-up planned  Yes, Facility      Additional Data Reviewed: CBC    Component Value Date/Time   WBC 7.3 02/23/2015 0534   RBC 3.25* 02/23/2015 0534   RBC 3.00* 06/16/2014 1705   HGB 9.9* 02/23/2015 0534   HCT 29.4* 02/23/2015 0534   PLT 112* 02/23/2015 0534   MCV 90.5 02/23/2015 0534   MCH 30.5 02/23/2015 0534   MCHC 33.7 02/23/2015 0534   RDW 14.7 02/23/2015 0534   LYMPHSABS 1.9 01/03/2015 1430   MONOABS 0.5 01/03/2015 1430   EOSABS 0.1 01/03/2015 1430   BASOSABS 0.1 01/03/2015 1430    CMP     Component Value Date/Time   NA 145 02/24/2015 0631   K 5.6* 02/24/2015 0631   CL 105 02/24/2015 0631   CO2 31 02/24/2015 0631   GLUCOSE 91 02/24/2015 0631   BUN 59* 02/24/2015 0631   CREATININE 5.48* 02/24/2015 0631   CALCIUM 8.5* 02/24/2015 0631   PROT 4.8* 02/17/2015 0300   ALBUMIN 2.0* 02/17/2015 0300   AST 35 02/17/2015 0300   ALT 17 02/17/2015 0300   ALKPHOS  76 02/17/2015 0300   BILITOT 1.1 02/17/2015 0300   GFRNONAA 8* 02/24/2015 0631   GFRAA 9* 02/24/2015 0631       Problem List:  Patient Active Problem List   Diagnosis Date Noted  . Arm edema   . Low oxygen saturation   . E. coli UTI 02/22/2015  . Pleuritic chest pain   . Palliative care encounter   . DNR (do not resuscitate) discussion   . Chronic anticoagulation-Eliquis 02/15/2015  . Sepsis ([place]) 02/14/2015  . AKI (acute kidney injury) ([place]) 02/14/2015  . Acute renal failure (ARF) ([place]) 02/14/2015  . NSTEMI (non-ST elevated myocardial infarction) ([place]) 10/16/2014  . S/P ICD (internal cardiac defibrillator)  procedure, [employer] 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 Ischemic cardiomyopathy 09/13/2014  . Mass of right breast on mammogram 07/14/2014  . Acute on chronic systolic CHF (congestive heart failure) ([place]) 04/09/2014  . CHF (congestive heart failure) ([place]) 04/09/2014  . CHF exacerbation ([place])   . Iron deficiency anemia 03/01/2014  . Gastritis and gastroduodenitis 02/28/2014  . Cervical cancer screening 12/30/2013  . Hypothyroidism 12/30/2013  . FH: colon cancer 11/18/2013  . Psoriasis 10/22/2013  . CKD (chronic kidney disease) stage 3, GFR 30-59 ml/min 10/22/2013  . Suicide attempt Sept 2015 10/03/2013  . Anxiety state 09/24/2013  . Cardiomyopathy, ischemic- EF 25-30% 2D 09/02/13 09/03/2013  . CAD- total LAD- residual OM2 disease 09/03/2013  . H/O PAF 06/06/2013  . Acute on chronic systolic (congestive) heart failure ([place]) 05/20/2013  . Tobacco abuse 05/09/2013  . Family history of premature CAD 05/09/2013  . H/O alcohol abuse 05/06/2013  . Hypertension   . GERD (gastroesophageal reflux disease)      Palliative Care Assessment & Plan    1.Code Status:  DNR    Code Status Orders        Start     Ordered   02/22/15 1038  Do not attempt resuscitation (DNR)   Continuous    Question Answer Comment  In the event of cardiac or respiratory ARREST Do not call a "code blue"   In the event of cardiac or respiratory ARREST Do not perform Intubation, CPR, defibrillation or ACLS   In the event of cardiac or respiratory ARREST Use medication by any route, position, wound care, and other measures to relive pain and suffering. May use oxygen, suction and manual treatment of airway obstruction as needed for comfort.      02/22/15 1038    Code Status History    Date Active Date Inactive Code Status Order ID Comments User Context   02/14/2015  8:07 PM 02/22/2015 10:38 AM Full Code [id number]  Oswald Hillock, MD Inpatient   10/16/2014  2:36 AM 10/16/2014  9:34 PM Full Code [id number]  Edwin Dada, MD Inpatient   09/13/2014  5:29 PM 09/14/2014  1:14 PM Full Code [id number]  [name], MD Inpatient   04/14/2014  4:15 PM 04/14/2014  7:39 PM Full Code [id number]  Larey Dresser, MD Inpatient   04/09/2014  5:15 AM 04/10/2014  5:04 PM Full Code [id number]  Orvan Falconer, MD Inpatient   10/03/2013 11:42 PM 10/13/2013  8:09 PM Full Code [id number]  Thad Ranger, MD Inpatient   09/03/2013  4:34 PM 09/07/2013  9:22 PM Full Code [id number]  Belva Crome, MD Inpatient   09/02/2013  1:00 PM 09/03/2013  4:34 PM Full Code [id number]  Sanda Klein, MD Inpatient   06/06/2013 12:10 AM 06/08/2013  7:17 PM Full Code [id number]  Kevan Ny  Nestor Lewandowsky, DO ED   05/10/2013  8:30 PM 05/15/2013  2:07 PM Full Code [id number]  [name], MD Inpatient   05/06/2013  2:25 PM 05/10/2013  8:30 PM Full Code [id number]  Sheila Oats, MD Inpatient       2. Goals of Care/Additional Recommendations:  Treat the treatable.   Limitations on Scope of Treatment: Treat the treatable.   Desire for further Chaplaincy support:Not discussed today.   Psycho-social Needs: None at this time.   3. Symptom Management:      1.per hospitalist   4. Palliative Prophylaxis:   Bowel Regimen, Frequent Pain Assessment and Oral Care  5. Prognosis: Unable to determine, based on outcomes.   6. Discharge Planning:  Return to group home.    Care plan was discussed with nursing staff, CM, SW, and Dr. Caryn Section.   Thank you for allowing the Palliative Medicine Team to assist in the care of this patient.   Time In: 1050 Time Out: 1115 Total Time 25 minutes Prolonged Time Billed  no         Drue Novel, NP  02/24/2015, 2:07 PM  Please contact Palliative Medicine Team phone at [phone] for questions and concerns.

## 2015-02-24 NOTE — Progress Notes (Signed)
Primary Cardiologist: [name]   Cardiology Specific Problem List: 1. Acute on Chronic Systolic Dysfunction 2. PAF 3. ICM  Subjective:    No chest pain or dyspnea at rest. Feeling more tired today. Appetite stable.  Objective:   Temp:  [98.1 F (36.7 C)-98.9 F (37.2 C)] 98.9 F (37.2 C) (02/03 0512) Pulse Rate:  [94-105] 105 (02/03 0910) Resp:  [20] 20 (02/03 0910) BP: (105-130)/(50-81) 108/76 mmHg (02/03 0910) SpO2:  [85 %-98 %] 98 % (02/03 0910) Weight:  [134 lb 7.7 oz (61 kg)] 134 lb 7.7 oz (61 kg) (02/03 0512) Last BM Date: 02/24/15  Filed Weights   02/22/15 0523 02/23/15 0653 02/24/15 0512  Weight: 137 lb 3.2 oz (62.234 kg) 136 lb 3.9 oz (61.8 kg) 134 lb 7.7 oz (61 kg)    Intake/Output Summary (Last 24 hours) at 02/24/15 0943 Last data filed at 02/23/15 0954  Gross per 24 hour  Intake    120 ml  Output   2000 ml  Net  -1880 ml    Telemetry: Sinus rhythm and tachycardic.  Exam:  General: No acute distress.  Lungs: Few scattered rhonchi, nonlabored.  Cardiac: Right IJ catheter. RRR, no S3.Marland Kitchen   Extremities: No pitting edema.  Lab Results:  Basic Metabolic Panel:  Recent Labs Lab 02/22/15 0535 02/23/15 0534 02/24/15 0631  NA 146* 143 145  K 4.0 4.4 5.6*  CL 103 100* 105  CO2 32 31 31  GLUCOSE 81 80 91  BUN 60* 60* 59*  CREATININE 5.44* 5.56* 5.48*  CALCIUM 8.1* 8.5* 8.5*   CBC:  Recent Labs Lab 02/21/15 0625 02/22/15 0535 02/23/15 0534  WBC 6.2 9.2 7.3  HGB 6.8* 10.2* 9.9*  HCT 19.7* 29.7* 29.4*  MCV 88.7 87.9 90.5  PLT 98* 108* 112*    Radiology: Dg Chest 2 View  02/22/2015  CLINICAL DATA:  Chest pain EXAM: CHEST  2 VIEW COMPARISON:  02/19/2015. FINDINGS: Bibasilar atelectasis or pneumonia again noted with improvement in basilar aeration bilaterally. Small bilateral pleural effusions persist. The cardio pericardial silhouette is enlarged. Right IJ central line tip overlies the mid SVC level. Single lead left-sided pacer/  AICD again noted. Telemetry leads overlie the chest. Bones are diffusely demineralized. IMPRESSION: Interval improvement in basilar aeration.  Caps Electronically Signed   By: Misty Stanley M.D.   On: 02/22/2015 12:09   US Venous Img Upper Uni Left  02/23/2015  CLINICAL DATA:  Left upper extremity edema, redness, and pain for 1 week. EXAM: LEFT UPPER EXTREMITY VENOUS DOPPLER ULTRASOUND TECHNIQUE: Gray-scale sonography with graded compression, as well as color Doppler and duplex ultrasound were performed to evaluate the upper extremity deep venous system from the level of the subclavian vein and including the jugular, axillary, basilic, radial, ulnar and upper cephalic vein. Spectral Doppler was utilized to evaluate flow at rest and with distal augmentation maneuvers. COMPARISON:  None. FINDINGS: Contralateral Subclavian Vein: Respiratory phasicity is normal and symmetric with the symptomatic side. No evidence of thrombus. Normal compressibility. Internal Jugular Vein: No evidence of thrombus. Normal compressibility, respiratory phasicity and response to augmentation. Subclavian Vein: No evidence of thrombus. Normal compressibility, respiratory phasicity and response to augmentation. Axillary Vein: No evidence of thrombus. Normal compressibility, respiratory phasicity and response to augmentation. Cephalic Vein: No evidence of thrombus. Normal compressibility, respiratory phasicity and response to augmentation. Basilic Vein: No evidence of thrombus. Normal compressibility, respiratory phasicity and response to augmentation. Brachial Veins: No evidence of thrombus. Normal compressibility, respiratory phasicity and response to  augmentation. Radial Veins: No evidence of thrombus. Normal compressibility, respiratory phasicity and response to augmentation. Ulnar Veins: No evidence of thrombus. Normal compressibility, respiratory phasicity and response to augmentation. Venous Reflux:  None visualized. Other Findings:   Left upper extremity subcutaneous edema noted. IMPRESSION: No evidence of deep venous thrombosis. Electronically Signed   By: Jerilynn Mages.  Shick M.D.   On: 02/23/2015 15:12     Medications:   Scheduled Medications: . calcium carbonate  1 tablet Oral TID WC  . carvedilol  6.25 mg Oral BID WC  . cefTRIAXone (ROCEPHIN)  IV  1 g Intravenous Q24H  . ezetimibe  10 mg Oral Daily  . hydrALAZINE  10 mg Oral BID  . levothyroxine  25 mcg Oral QAC breakfast  . pantoprazole  40 mg Oral BID AC  . rosuvastatin  40 mg Oral q1800  . sertraline  100 mg Oral Daily  . sodium polystyrene  30 g Oral Once  . torsemide  40 mg Oral Daily   PRN Medications: acetaminophen, albuterol, ondansetron **OR** ondansetron (ZOFRAN) IV, promethazine, zolpidem   Assessment and Plan:   1. Acute on Chronic Systolic CHF in the setting of systolic dysfunction:  She has diuresed another 2000 cc overnight on po diuretics, torsemide 40 mg daily. Uncertain if we need to decrease this or not. Creatinine is essentially flat at 5.5-5.4. Potassium is elevated this am without potassium supplement. Wt down another 2 lbs. Continuing to hold ACE, spironolactone. Continue Coreg and Hydralazine. She is tachycardic today.  2. Anemia; Hgb is beginning to decrease, with hemepositive stool. She is followed by Dr. Oneida Alar as OP. Defer to PCP for need to have GI see. She has been taken off Eliquis.  3. Acute on chronic renal failure: Multifactorial, evaluated by Nephrology. No plan for HD at this time, but outpatient follow-up planned with Dr. Lowanda Foster.  4. Sepsis:  Resolved. E. Coli UTI, on antibiotics per primary team.  Phill Myron. Lawrence NP [place]  02/24/2015, 9:43 AM    Attending note:  Patient seen and examined. Modified above note by Ms. Lawrence NP. Patient is tolerating Coreg and hydralazine as well as recent initiation of Demadex with reasonable urine output. Despite this, her creatinine is not improving significantly. She will continue to  follow with Dr. Lowanda Foster, although at this point holding off on hemodialysis. Lungs exhibit scattered rhonchi but nonlabored breathing, cardiac exam with RRR and no S3 at this time, no peripheral edema. Lab work shows creatinine 5.4. She also remains anemic with hemoglobin 10.2 down to 9.9 and recent heme-positive stool. She has been taken off of Eliquis, would not resume anticoagulation until her anemia is better sorted out. Fortunately, she has maintained sinus rhythm with history of PAF. Would not resume Aldactone or Entresto at this time. Advance Coreg to 12.5 mg twice daily.  Satira Sark, M.D., F.A.C.C.

## 2015-02-24 NOTE — Discharge Summary (Signed)
Physician Discharge Summary  [name] [id number] DOB: [birth year] DOA: 02/14/2015  PCP: [name] Ends, MD  Admit date: 02/14/2015 Discharge date: 02/24/2015  Time spent: Greater than 30 minutes  Recommendations for Outpatient Follow-up:  1.  Patient should be provided with transportation to follow-up with all of her specialist appointments scheduled. 2. Avoid taking all NSAIDs such as ibuprofen, Aleve, Motrin, Advil, BC powders goodies powders, aspirin, and the like. 3.    Discharge Diagnoses:  1. Sepsis secondary to Escherichia coli urinary tract infection. 2. Acute on chronic systolic congestive heart failure with a history of ischemic cardiomyopathy. Ejection fraction 25-30% per echo. 3. Status post ICD, 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 4. Chronic paroxysmal atrial fibrillation. Eliquis discontinued secondary to heme positive stool and anemia. 5. Acute respiratory failure with hypoxia secondary to acute CHF. Resolved. 6. 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 7. Acute on chronic anemia. Status post 2 units of packed red blood cell transfusions. 8. Hypothyroidism. 9. Left upper extremity edema secondary to mild phlebitis associated with previous IV.   Discharge Condition: Improved  Diet recommendation: Heart healthy  Filed Weights   02/22/15 0523 02/23/15 0653 02/24/15 0512  Weight: 62.234 kg (137 lb 3.2 oz) 61.8 kg (136 lb 3.9 oz) 61 kg (134 lb 7.7 oz)    History of present illness:  Patient is a 60 year old woman with a history of chronic systolic heart failure, ischemic cardiomyopathy, PAF on anticoagulation, ICD in situ, and chronic anemia, who presented to the ED with altered mental status, hypothermia, and hypertension. She was found to be in acute renal failure. Urinalysis was consistent with infection. She was admitted for further evaluation and  management.  Hospital Course:   1. Sepsis, secondary to Escherichia coli urinary tract infection; causing acute encephalopathy. On admission, the patient was encephalopathic, hypothermic, hypotensive, and with a grossly abnormal UA. Cultures were ordered and she was bolused with several liters of IV fluids and started on IV vancomycin and cefepime. Eventually, Neo-Synephrine had to be given. -Urine culture eventually grew out Escherichia coli, sensitive to ceftriaxone. Her blood pressure and hemodynamics improved. Her mental status improved and returned to baseline. She finished a course of antibiotic therapy during the hospitalization.  Acute on chronic systolic congestive heart failure with a history of ischemic cardiomyopathy. Patient has a history of systolic and diastolic heart failure. She also has an AICD. On admission, Entresto, Aldactone, and Coreg were withheld secondary to hypotension. Following volume resuscitation, she developed acute on chronic heart failure. She was started on IV Lasix; it was titrated up per nephrology due to her acute renal failure. -2-D echocardiogram on 02/15/15 revealed an EF of 25-30% and diffuse hypokinesis. -Cardiology was consulted. Small dosing of hydralazine was started. Crestor was restarted. Coreg was restarted on 02/22/15 following stabilization of her blood pressure.. -Patient started diuresing better, but urine output had dropped off some. Dr. Lowanda Foster discontinued IV Lasix in favor of oral Demadex on 02/23/15. -Follow-up chest x-ray on 02/22/15 revealed interval improvement in basilar aeration and small bilateral pleural effusions. -Per cardiology, she should remain on Demadex, Coreg, and hydralazine. Entresto and Aldactone were not restarted. She will follow-up with her primary cardiologist, [name] in 2 weeks.  Chronic paroxysmal atrial fibrillation. Patient is chronically anticoagulated on Eliquis. It was discontinued and she was started on IV  heparin for anticoagulation. It was discontinued in the setting of acute renal failure and worsening anemia.  Her rate has started to increase. Coreg was restarted on 02/22/15 and titrated up by cardiology. Cardiology recommended discontinuation of Eliquis until she is reevaluated by GI.  Acute respiratory failure with hypoxia. The patient became hypoxic. Her follow-up chest x-ray revealed pulmonary edema. She was started on supplemental oxygen and started on treatment for acute heart failure. -Her hypoxia resolved as she was oxygenating 93% on room air at the time of discharge.  Acute kidney injury superimposed on chronic kidney disease. Patient's baseline creatinine was 1.26 one month ago. On admission, it was 7.7. She was started on vigorous IV fluids.  Nephrology was consulted. IV Lasix was started and titrated up to 100 mg twice a day per nephrology. -Her urine output was oliguric, but began to increase to the nonoliguric range. IV fluids were discontinued. Dr. Lowanda Foster change diuretic therapy to [facility]. He did not believe she needed hemodialysis. -Etiology was suspected to be hypoperfusion from hypotension, sepsis, and ATN. -She will follow-up with nephrology in 3 weeks.  Hypokalemia>>> hyperkalemia. Patient was started on potassium supplementation for hypokalemia. It improved. However, on the day of discharge, her serum potassium was 5.6. Oral potassium was discontinued. Discussed this with Dr. Lowanda Foster who recommended Kayexalate, but mildly increased serum potassium should not deter her from discharge. Therefore, patient was given Kayexalate prior to discharge. Home health registered nurse was ordered for a follow-up basic metabolic panel on [id number] for the results to be called to Dr. Lowanda Foster.  Acute on chronic anemia; likely multifactorial including hemodilution from IV fluids and decompensated kidney function. 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 Patient's hemoglobin was 10.4 on admission. It gradually decreased, but significantly decreased to 6.8 on 02/21/15. Heparin was discontinued as was Eliquis earlier. She was transfused 2 units of packed red blood cells. Her hemoglobin improved appropriately. -Etiology of acute anemia is unknown, but could be secondary to hemodilution overall and decompensated renal function, rather than acute bleeding. - Protonix was increased to twice a day. Her stool was heme positive. -An appointment was made for her to follow-up with GI in one week.  Thrombocytopenia. The patient's platelet count fell gradually during the hospitalization. This was thought to be secondary to heparin that was given when Eliquis was discontinued. Her platelet gallop can to trend back up. Would recommend follow-up platelet count at her follow-up appointments.  Hypothyroidism. Patient was continued on Synthroid. Her TSH was within normal limits.  Left upper extremity edema. Patient had a previous IV in her left upper extremity. The edema appeared to be more infiltrative and/or from mild thrombophlebitis. Warm compresses were ordered. Venous ultrasound was ordered to rule out DVT and it was negative for DVT.     Procedures:  2-D echocardiogram on 02/15/15: Study Conclusions - Left ventricle: Diffuse hypokinesis worse in the inferior wall and septum paradoxic motion ? from pacing The cavity size was moderately dilated. Wall thickness was normal. Systolic function was severely reduced. The estimated ejection fraction was in the range of 25% to 30%. - Atrial septum: No defect or patent foramen ovale was identified. - Pericardium, extracardiac: A trivial pericardial effusion was identified. - Impressions: No evidence of SBE or vegetation Impressions: - No evidence of SBE or vegetation  Right IJ central  line; discontinued at discharge.   Consultations:  Cardiology  Nephrology  Palliative care.  Discharge Exam: Filed Vitals:   02/24/15 [id number] 02/24/15 [id number]  BP: 130/81 108/76  Pulse: 103 105  Temp: 98.9 F (37.2 C)   Resp: 20 20   oxygen saturation at rest and with ambulation 93% on room air.   General: Alert 60 year old woman in no acute distress.  Cardiovascular: S1, S2, with occasional ectopic beat versus irregular, irregular.  Respiratory: Breathing nonlabored. Lungs are clear anteriorly.  Abdomen: Positive bowel sounds, soft, nontender, nondistended.  Musculoskeletal/extremities: Left upper extremity with mild edema above the antecubital area, mildly tender, no significant erythema. No pedal edema. No acute hot red joints.  Neurologic: She is alert and oriented 3. Cranial nerves II through XII are grossly intact.  Discharge Instructions   Discharge Instructions    Diet - low sodium heart healthy    Complete by:  As directed      Increase activity slowly    Complete by:  As directed           Current Discharge Medication List    START taking these medications   Details  calcium carbonate (OS-CAL - DOSED IN MG OF ELEMENTAL CALCIUM) 1250 (500 Ca) MG tablet Take 1 tablet (500 mg of elemental calcium total) by mouth 3 (three) times daily with meals. Qty: 90 tablet, Refills: 3    hydrALAZINE (APRESOLINE) 10 MG tablet Take 1 tablet (10 mg total) by mouth 2 (two) times daily. Qty: 60 tablet, Refills: 2    torsemide (DEMADEX) 20 MG tablet Take 2 tablets (40 mg total) by mouth daily. Qty: 60 tablet, Refills: 2      CONTINUE these medications which have CHANGED   Details  zolpidem (AMBIEN) 10 MG tablet Take 1 tablet (10 mg total) by mouth at bedtime as needed for sleep. Qty: 30 tablet, Refills: 0      CONTINUE these medications which have NOT CHANGED   Details  carvedilol (COREG) 12.5 MG tablet Take 1 tablet (12.5 mg total) by mouth 2 (two) times  daily. Qty: 60 tablet, Refills: 6    ezetimibe (ZETIA) 10 MG tablet Take 1 tablet (10 mg total) by mouth daily. Qty: 90 tablet, Refills: 3    levothyroxine (SYNTHROID, LEVOTHROID) 25 MCG tablet Take 25 mcg by mouth daily before breakfast.     LORazepam (ATIVAN) 0.5 MG tablet Take 1 tablet (0.5 mg total) by mouth 2 (two) times daily. Qty: 60 tablet, Refills: 2   Associated Diagnoses: Anxiety state    pantoprazole (PROTONIX) 40 MG tablet TAKE 1 TABLET BY MOUTH TWICE DAILY BEFORE A MEAL. Qty: 60 tablet, Refills: 5    sertraline (ZOLOFT) 50 MG tablet Take 100 mg by mouth daily.    acetaminophen (TYLENOL) 325 MG tablet Take 650 mg by mouth every 6 (six) hours as needed for mild pain.    ammonium lactate (AMLACTIN) 12 % cream Apply 1 g topically daily as needed for dry skin (apply to elbows).     nitroGLYCERIN (NITROSTAT) 0.3 MG SL tablet Place 0.3 mg under the tongue every 5 (five) minutes as needed for chest pain (MAX 3 TABLETS). Reported on 01/18/2015    ondansetron (ZOFRAN) 4 MG tablet Take 4 mg by mouth every 8 (eight) hours as needed for nausea or vomiting.    rosuvastatin (CRESTOR) 40 MG tablet TAKE 1 TABLET BY MOUTH ONCE DAILY. Qty: 30 tablet, Refills: 0      STOP taking these medications     apixaban (ELIQUIS) 5 MG TABS tablet      furosemide (LASIX) 20 MG tablet      sacubitril-valsartan (ENTRESTO) 24-26 MG  spironolactone (ALDACTONE) 25 MG tablet        Allergies  Allergen Reactions  . Morphine And Related Other (See Comments)    Dizziness and hallucinations  . Penicillins Anaphylaxis and Rash    Has patient had a PCN reaction causing immediate rash, facial/tongue/throat swelling, SOB or lightheadedness with hypotension:unknown Has patient had a PCN reaction causing severe rash involving mucus membranes or skin necrosis: unknown Has patient had a PCN reaction that required hospitalization unknown Has patient had a PCN reaction occurring within the last 10  years: unknown If all of the above answers are "NO", then may proceed with Cephalosporin use.    Follow-up Information    Follow up with [facility], MD On 03/07/2015.   Specialty:  Nephrology   Why:  at 1:30 pm   Contact information:   [address] [place] [address] [phone]       Follow up with [name], MD On 03/08/2015.   Specialty:  Cardiology   Why:  9;20 am [facility]    Contact information:   [address] [place] [address] [phone]       Follow up with Ranae Pila, NP On 03/02/2015.   Specialty:  Gastroenterology   Why:  PA FOR DR. FIELDS  AT 11: 00AM   Contact information:   [place] [address] [phone]        The results of significant diagnostics from this hospitalization (including imaging, microbiology, ancillary and laboratory) are listed below for reference.    Significant Diagnostic Studies: Dg Chest 2 View  02/22/2015  CLINICAL DATA:  Chest pain EXAM: CHEST  2 VIEW COMPARISON:  02/19/2015. FINDINGS: Bibasilar atelectasis or pneumonia again noted with improvement in basilar aeration bilaterally. Small bilateral pleural effusions persist. The cardio pericardial silhouette is enlarged. Right IJ central line tip overlies the mid SVC level. Single lead left-sided pacer/ AICD again noted. Telemetry leads overlie the chest. Bones are diffusely demineralized. IMPRESSION: Interval improvement in basilar aeration.  Caps Electronically Signed   By: Misty Stanley M.D.   On: 02/22/2015 12:09   Ct Head Wo Contrast  02/14/2015  CLINICAL DATA:  Altered mental status and fatigue since having 3 teeth pulled 3 days ago. EXAM: CT HEAD WITHOUT CONTRAST TECHNIQUE: Contiguous axial images were obtained from the base of the skull through the vertex without intravenous contrast. COMPARISON:  10/02/2008 FINDINGS: Multiple small, chronic infarcts are again seen in the right MCA territory  involving the frontal, parietal, and temporal lobes as well as basal ganglia. There is mild ex vacuo dilatation of the right lateral ventricle. There is no evidence of acute large territory infarct, intracranial hemorrhage, mass effect, or extra-axial fluid collection. Orbits are unremarkable. The visualized paranasal sinuses are clear. There are small, chronic bilateral mastoid effusions. Calcified atherosclerosis is noted at the skull base. IMPRESSION: 1. No evidence of acute intracranial abnormality. 2. Chronic right MCA territory infarcts. Electronically Signed   By: Logan Bores M.D.   On: 02/14/2015 16:57   US Abdomen Complete  02/03/2015  CLINICAL DATA:  Diarrhea and vomiting for 1 month. Hypertension and pancreatitis in the past. EXAM: ABDOMEN ULTRASOUND COMPLETE COMPARISON:  09/03/2013 FINDINGS: Gallbladder: No gallstones or wall thickening visualized. No sonographic Murphy sign noted by sonographer. Common bile duct: Diameter: 4 mm Liver: No focal lesion identified. Within normal limits in parenchymal echogenicity. IVC: No abnormality visualized. Pancreas: Visualized portion unremarkable. Parts of the pancreatic head and tail  are not well seen due to overlying bowel gas. Spleen: Size and appearance within normal limits. Right Kidney: Length: 9.9 cm. Echogenicity within normal limits. No mass or hydronephrosis visualized. Left Kidney: Length: 10.3 cm. Echogenicity within normal limits. No mass or hydronephrosis visualized. Abdominal aorta: No aneurysm visualized. Abdominal aortic atherosclerotic calcification. Other findings: None. IMPRESSION: 1. Abdominal aortic atherosclerotic calcification. 2. Otherwise, no significant abnormalities are observed. Parts of the pancreas were not well seen due to overlying bowel gas. Electronically Signed   By: Van Clines M.D.   On: 02/03/2015 08:53   US Renal  02/15/2015  CLINICAL DATA:  Acute kidney insufficiency EXAM: RENAL / URINARY TRACT ULTRASOUND  COMPLETE COMPARISON:  Abdominal ultrasound 02/03/2015 FINDINGS: Right Kidney: Length: 10.2 cm . There is normal echogenicity. No hydronephrosis. No renal calculus. Trace perinephric fluid in midpole. Left Kidney: Length: 10.5 cm. Echogenicity within normal limits. No mass or hydronephrosis visualized. Bladder: Decompressed urinary bladder with Foley catheter. IMPRESSION: No hydronephrosis. No renal calculi. Trace perinephric fluid midpole of the right kidney. Unremarkable urinary bladder. Electronically Signed   By: Lahoma Crocker M.D.   On: 02/15/2015 09:49   US Venous Img Upper Uni Left  02/23/2015  CLINICAL DATA:  Left upper extremity edema, redness, and pain for 1 week. EXAM: LEFT UPPER EXTREMITY VENOUS DOPPLER ULTRASOUND TECHNIQUE: Gray-scale sonography with graded compression, as well as color Doppler and duplex ultrasound were performed to evaluate the upper extremity deep venous system from the level of the subclavian vein and including the jugular, axillary, basilic, radial, ulnar and upper cephalic vein. Spectral Doppler was utilized to evaluate flow at rest and with distal augmentation maneuvers. COMPARISON:  None. FINDINGS: Contralateral Subclavian Vein: Respiratory phasicity is normal and symmetric with the symptomatic side. No evidence of thrombus. Normal compressibility. Internal Jugular Vein: No evidence of thrombus. Normal compressibility, respiratory phasicity and response to augmentation. Subclavian Vein: No evidence of thrombus. Normal compressibility, respiratory phasicity and response to augmentation. Axillary Vein: No evidence of thrombus. Normal compressibility, respiratory phasicity and response to augmentation. Cephalic Vein: No evidence of thrombus. Normal compressibility, respiratory phasicity and response to augmentation. Basilic Vein: No evidence of thrombus. Normal compressibility, respiratory phasicity and response to augmentation. Brachial Veins: No evidence of thrombus. Normal  compressibility, respiratory phasicity and response to augmentation. Radial Veins: No evidence of thrombus. Normal compressibility, respiratory phasicity and response to augmentation. Ulnar Veins: No evidence of thrombus. Normal compressibility, respiratory phasicity and response to augmentation. Venous Reflux:  None visualized. Other Findings:  Left upper extremity subcutaneous edema noted. IMPRESSION: No evidence of deep venous thrombosis. Electronically Signed   By: Jerilynn Mages.  Shick M.D.   On: 02/23/2015 15:12   Dg Chest Portable 1 View  02/14/2015  CLINICAL DATA:  Repositioned IJ line. EXAM: PORTABLE CHEST 1 VIEW COMPARISON:  02/14/2015 FINDINGS: Cardiac pacemaker. Right central venous catheter has been pulled back with tip now all over the low SVC region. No pneumothorax. Heart size and pulmonary vascularity are normal. No focal airspace disease or consolidation in the lungs. No blunting of costophrenic angles. IMPRESSION: Right central venous catheter has been pulled back with tip now over the low SVC region. No evidence of active pulmonary disease. Electronically Signed   By: Lucienne Capers M.D.   On: 02/14/2015 19:06   Dg Chest Portable 1 View  02/14/2015  CLINICAL DATA:  Status post central line placement today. Initial encounter. EXAM: PORTABLE CHEST 1 VIEW COMPARISON:  Single view of the chest earlier today. FINDINGS: A new right IJ approach  central venous catheter is in place with the tip projecting in the right atrium. Recommend withdrawal of 3-3.5 cm. There is no pneumothorax. The lungs are clear. Heart size is normal. No pleural effusion. IMPRESSION: Right IJ catheter tip projects in the right atrium. Recommend withdrawal of 3-3.5 cm. Negative for pneumothorax. Electronically Signed   By: Inge Rise M.D.   On: 02/14/2015 18:14   Dg Chest Port 1 View  02/14/2015  CLINICAL DATA:  60 year old with acute onset of fatigue and mental status changes approximately 3 days ago related to having had  several teeth pulled. Indwelling pacemaker. EXAM: PORTABLE CHEST 1 VIEW COMPARISON:  10/15/2014 and earlier. FINDINGS: Suboptimal inspiration accounts for crowded bronchovascular markings, especially in the bases, and accentuates the cardiac silhouette. Taking this into account, cardiac silhouette upper normal in size for AP portable technique. Lungs clear. Bronchovascular markings normal. Pulmonary vascularity normal. No visible pleural effusions. No pneumothorax. Left subclavian single lead transvenous pacemaker unchanged and appears intact. IMPRESSION: Suboptimal inspiration.  No acute cardiopulmonary disease. Electronically Signed   By: Evangeline Dakin M.D.   On: 02/14/2015 15:05   Dg Chest Port 1v Same Day  02/19/2015  CLINICAL DATA:  Shortness of Breath EXAM: PORTABLE CHEST 1 VIEW COMPARISON:  02/14/2015 FINDINGS: Right jugular catheter is again identified at the cavoatrial junction. A defibrillator is again seen and stable. Cardiac shadow is stable. Increasing left retrocardiac density is noted with associated effusion. Mild atelectatic changes are noted in the right base. IMPRESSION: Increasing left retrocardiac infiltrate with associated small effusion. Mild right basilar density is noted as well. Electronically Signed   By: Inez Catalina M.D.   On: 02/19/2015 11:43    Microbiology: Recent Results (from the past 240 hour(s))  MRSA PCR Screening     Status: None   Collection Time: 02/14/15  2:08 PM  Result Value Ref Range Status   MRSA by PCR NEGATIVE NEGATIVE Final    Comment:        The GeneXpert MRSA Assay (FDA approved for NASAL specimens only), is one component of a comprehensive MRSA colonization surveillance program. It is not intended to diagnose MRSA infection nor to guide or monitor treatment for MRSA infections.   Urine culture     Status: None   Collection Time: 02/14/15  2:10 PM  Result Value Ref Range Status   Specimen Description URINE, CATHETERIZED  Final   Special  Requests NONE  Final   Culture   Final    >=100,000 COLONIES/mL ESCHERICHIA COLI Performed at [facility]    Report Status 02/17/2015 FINAL  Final   Organism ID, Bacteria ESCHERICHIA COLI  Final      Susceptibility   Escherichia coli - MIC*    AMPICILLIN >=32 RESISTANT Resistant     CEFAZOLIN <=4 SENSITIVE Sensitive     CEFTRIAXONE <=1 SENSITIVE Sensitive     CIPROFLOXACIN <=0.25 SENSITIVE Sensitive     GENTAMICIN <=1 SENSITIVE Sensitive     IMIPENEM <=0.25 SENSITIVE Sensitive     NITROFURANTOIN <=16 SENSITIVE Sensitive     TRIMETH/SULFA <=20 SENSITIVE Sensitive     AMPICILLIN/SULBACTAM 16 INTERMEDIATE Intermediate     PIP/TAZO <=4 SENSITIVE Sensitive     * >=100,000 COLONIES/mL ESCHERICHIA COLI  Blood Culture (routine x 2)     Status: None   Collection Time: 02/14/15  2:30 PM  Result Value Ref Range Status   Specimen Description BLOOD LEFT ANTECUBITAL DRAWN BY RN  Final   Special Requests BOTTLES DRAWN AEROBIC ONLY 6CC  Final   Culture NO GROWTH 6 DAYS  Final   Report Status 02/20/2015 FINAL  Final  Blood Culture (routine x 2)     Status: None   Collection Time: 02/15/15  3:28 AM  Result Value Ref Range Status   Specimen Description BLOOD A-LINE DRAW DRAWN BY RN [place]  Final   Special Requests BOTTLES DRAWN AEROBIC AND ANAEROBIC 8CC EACH  Final   Culture NO GROWTH 5 DAYS  Final   Report Status 02/20/2015 FINAL  Final     Labs: Basic Metabolic Panel:  Recent Labs Lab 02/20/15 0557 02/21/15 0625 02/22/15 0535 02/23/15 0534 02/24/15 0631  NA 139 140 146* 143 145  K 4.3 3.1* 4.0 4.4 5.6*  CL 98* 99* 103 100* 105  CO2 24 30 32 31 31  GLUCOSE 93 104* 81 80 91  BUN 58* 61* 60* 60* 59*  CREATININE 5.57* 5.59* 5.44* 5.56* 5.48*  CALCIUM 7.8* 7.7* 8.1* 8.5* 8.5*  PHOS  --   --  3.8  --   --    Liver Function Tests: No results for input(s): AST, ALT, ALKPHOS, BILITOT, PROT, ALBUMIN in the last 168 hours. No results for input(s): LIPASE, AMYLASE in the last 168  hours. No results for input(s): AMMONIA in the last 168 hours. CBC:  Recent Labs Lab 02/19/15 0451 02/20/15 0557 02/21/15 0625 02/22/15 0535 02/23/15 0534  WBC 5.9 7.6 6.2 9.2 7.3  HGB 8.5* 8.0* 6.8* 10.2* 9.9*  HCT 24.1* 23.3* 19.7* 29.7* 29.4*  MCV 87.0 88.9 88.7 87.9 90.5  PLT 110* 123* 98* 108* 112*   Cardiac Enzymes: No results for input(s): CKTOTAL, CKMB, CKMBINDEX, TROPONINI in the last 168 hours. BNP: BNP (last 3 results)  Recent Labs  04/09/14 0038  BNP 1385.0*    ProBNP (last 3 results) No results for input(s): PROBNP in the last 8760 hours.  CBG:  Recent Labs Lab 02/17/15 1622  GLUCAP 73       Signed:  Zebbie Ace MD.  [facility] 02/24/2015, 11:23 AM

## 2015-02-24 NOTE — Care Management Note (Signed)
Case Management Note  Patient Details  Name: [name] MRN: [id number] Date of Birth: 1955/07/20   Expected Discharge Date:     02/24/2015            Expected Discharge Plan:  Assisted Living / Rest Home  In-House Referral:  Clinical Social Work  Discharge planning Services  CM Consult  Post Acute Care Choice:  Home Health Choice offered to:  Patient  DME Arranged:    DME Agency:     HH Arranged:  RN [place] Agency:  [place]  Status of Service:  Completed, signed off  Medicare Important Message Given:    Date Medicare IM Given:    Medicare IM give by:    Date Additional Medicare IM Given:    Additional Medicare Important Message give by:     If discussed at [place] of Stay Meetings, dates discussed:    Additional Comments: Pt discharging to ALF with [facility] nursing. Pt is not eligible for PT as she has medicaid and PT is not covered under medicaid. Facility [facility]ested [facility] agency, pt is agreeable. Romualdo Bolk, of [facility], made aware of referral and will obtain pt info from chart. Pt/facility aware HH has 48 hours to initiate services. No further CM needs. CSW has arranged for return to facility. No further CM needs.  [name], RN 02/24/2015, 12:58 PM

## 2015-02-24 NOTE — Progress Notes (Signed)
Subjective: Patient presently offers no complaints. She's feeling much better and denies any difficulty breathing.  medical contraindication.   Objective: Vital signs in last 24 hours: Temp:  [98.1 F (36.7 C)-98.9 F (37.2 C)] 98.9 F (37.2 C) (02/03 0512) Pulse Rate:  [94-103] 103 (02/03 0512) Resp:  [20] 20 (02/03 0512) BP: (105-130)/(50-81) 130/81 mmHg (02/03 0512) SpO2:  [85 %-98 %] 93 % (02/03 0512) Weight:  [134 lb 7.7 oz (61 kg)] 134 lb 7.7 oz (61 kg) (02/03 0512)  Intake/Output from previous day: 02/02 0701 - 02/03 0700 In: 120 [IV Piggyback:120] Out: 2000 [Urine:2000] Intake/Output this shift:     Recent Labs  02/22/15 0535 02/23/15 0534  HGB 10.2* 9.9*    Recent Labs  02/22/15 0535 02/23/15 0534  WBC 9.2 7.3  RBC 3.38* 3.25*  HCT 29.7* 29.4*  PLT 108* 112*    Recent Labs  02/23/15 0534 02/24/15 0631  NA 143 145  K 4.4 5.6*  CL 100* 105  CO2 31 31  BUN 60* 59*  CREATININE 5.56* 5.48*  GLUCOSE 80 91  CALCIUM 8.5* 8.5*   No results for input(s): LABPT, INR in the last 72 hours.  Generally patient is alert and in no apparent distress Chest: She is clear Heart exam regular rate and rhythm no murmur Abdomen: Positive bowel sounds Extremities no edema  Assessment/Plan: Problem #1 acute kidney injury superimposed on chronic. Etiology was thought to be secondary to combination of ATN/prerenal/RAAS. Her renal function continued to improve. Patient presently is asymptomatic. Problem #3 history of ischemic cardiomyopathy with low ejection fraction. She is also status post AICD placement. Her urine output has improved. Chest x-ray also shows some sign of improvement. Presently patient is becoming less symptomatic. Patient presently on Demadex and she had about 2000 mL of urine output. Problem #4 anemia: Her hemoglobin is low but stable. Problem #5 history of hypertension: Her blood pressure is reasonably controlled. Problem #6 history of chronic renal  failure. Patient creatinine was 1.26 with a GFR of 46 mL/m about 4 weeks ago on 01/18/2015. That's his stage III chronic renal failure Problem #7 hypocalcemia: Patient is on calcium supplement. Her calcium has corrected. Problem#8 hyperkalemia: Patient was potassium supplement and that was discontinued. Plan: 1] we'll continue with Demadex 40 mg once a day. ] we'll check basic metabolic panel tomorrow.    Lace Chenevert S 02/24/2015, 8:45 AM

## 2015-02-24 NOTE — Progress Notes (Signed)
SATURATION QUALIFICATIONS: (This note is used to comply with regulatory documentation for home oxygen)  Patient Saturations on Room Air at Rest = 93%  Patient Saturations on Room Air while Ambulating = 93%  No need for O2 while ambulating, will continue to monitor.

## 2015-02-24 NOTE — NC FL2 (Signed)
[place] LEVEL OF CARE SCREENING TOOL     IDENTIFICATION  Patient Name: [name] Birthdate: 1955-03-18 Sex: female Admission Date (Current Location): 02/14/2015  [facility] and [place] Number:  [employer] and Address:  [facility] [address] [place]      Provider Number: [id number]  Attending Physician Name and Address:  Rexene Alberts, MD  Relative Name and Phone Number:       Current Level of Care: Hospital Recommended Level of Care: [place] Prior Approval Number:    Date Approved/Denied:   PASRR Number:    Discharge Plan: Domiciliary (Rest home)    Current Diagnoses: Patient Active Problem List   Diagnosis Date Noted  . Arm edema   . Low oxygen saturation   . E. coli UTI 02/22/2015  . Pleuritic chest pain   . Palliative care encounter   . DNR (do not resuscitate) discussion   . Chronic anticoagulation-Eliquis 02/15/2015  . Sepsis ([place]) 02/14/2015  . AKI (acute kidney injury) ([place]) 02/14/2015  . Acute renal failure (ARF) ([place]) 02/14/2015  . NSTEMI (non-ST elevated myocardial infarction) ([place]) 10/16/2014  . S/P ICD (internal cardiac defibrillator) procedure, St. 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 Ischemic cardiomyopathy 09/13/2014  . Mass of right breast on mammogram 07/14/2014  . Acute on chronic systolic CHF (congestive heart failure) ([place]) 04/09/2014  . CHF (congestive heart failure) ([place]) 04/09/2014  . CHF exacerbation ([place])   . Iron deficiency anemia 03/01/2014  . Gastritis and gastroduodenitis 02/28/2014  . Cervical cancer screening 12/30/2013  . Hypothyroidism 12/30/2013  . FH: colon cancer 11/18/2013  . Psoriasis 10/22/2013  . CKD (chronic kidney disease) stage 3, GFR 30-59 ml/min 10/22/2013  . Suicide attempt Sept 2015 10/03/2013  . Anxiety state 09/24/2013  . Cardiomyopathy, ischemic- EF 25-30% 2D 09/02/13 09/03/2013  . CAD- total LAD- residual OM2 disease 09/03/2013  . H/O PAF  06/06/2013  . Acute on chronic systolic (congestive) heart failure ([place]) 05/20/2013  . Tobacco abuse 05/09/2013  . Family history of premature CAD 05/09/2013  . H/O alcohol abuse 05/06/2013  . Hypertension   . GERD (gastroesophageal reflux disease)     Orientation RESPIRATION BLADDER Height & Weight     Self  Normal Continent Weight: 134 lb 7.7 oz (61 kg) Height:  5\' 4"  (162.6 cm)  BEHAVIORAL SYMPTOMS/MOOD NEUROLOGICAL BOWEL NUTRITION STATUS      Continent Diet (LOW SODIUM)  AMBULATORY STATUS COMMUNICATION OF NEEDS Skin     Verbally Normal                       Personal Care Assistance Level of Assistance  Bathing, Dressing           Functional Limitations Info             SPECIAL CARE FACTORS FREQUENCY                       Contractures      Additional Factors Info  Code Status Code Status Info: FULL CODE             Current Medications (02/24/2015):  This is the current hospital active medication list Current Facility-Administered Medications  Medication Dose Route Frequency Provider Last Rate Last Dose  . acetaminophen (TYLENOL) tablet 650 mg  650 mg Oral Q6H PRN Erline Hau, MD   650 mg at 02/22/15 1942  . albuterol (PROVENTIL) (2.5 MG/3ML) 0.083%  nebulizer solution 2.5 mg  2.5 mg Nebulization Q2H PRN Erline Hau, MD   2.5 mg at 02/22/15 0813  . calcium carbonate (OS-CAL - dosed in mg of elemental calcium) tablet 500 mg of elemental calcium  1 tablet Oral TID WC Rexene Alberts, MD   500 mg of elemental calcium at 02/24/15 0915  . carvedilol (COREG) tablet 12.5 mg  12.5 mg Oral BID WC Satira Sark, MD      . cefTRIAXone (ROCEPHIN) 1 g in dextrose 5 % 50 mL IVPB  1 g Intravenous Q24H Erline Hau, MD   1 g at 02/24/15 1043  . ezetimibe (ZETIA) tablet 10 mg  10 mg Oral Daily Oswald Hillock, MD   10 mg at 02/24/15 0915  . hydrALAZINE (APRESOLINE) tablet 10 mg  10 mg Oral BID Satira Sark, MD   10 mg at  02/24/15 0915  . levothyroxine (SYNTHROID, LEVOTHROID) tablet 25 mcg  25 mcg Oral QAC breakfast Oswald Hillock, MD   25 mcg at 02/24/15 0915  . ondansetron (ZOFRAN) tablet 4 mg  4 mg Oral Q6H PRN Oswald Hillock, MD   4 mg at 02/19/15 0909   Or  . ondansetron (ZOFRAN) injection 4 mg  4 mg Intravenous Q6H PRN Oswald Hillock, MD   4 mg at 02/22/15 0626  . pantoprazole (PROTONIX) EC tablet 40 mg  40 mg Oral BID AC Rexene Alberts, MD   40 mg at 02/24/15 0915  . promethazine (PHENERGAN) injection 12.5 mg  12.5 mg Intravenous Q6H PRN Erline Hau, MD   12.5 mg at 02/20/15 0059  . rosuvastatin (CRESTOR) tablet 40 mg  40 mg Oral q1800 Oswald Hillock, MD   40 mg at 02/23/15 1701  . sertraline (ZOLOFT) tablet 100 mg  100 mg Oral Daily Oswald Hillock, MD   100 mg at 02/24/15 0915  . torsemide (DEMADEX) tablet 40 mg  40 mg Oral Daily Fran Lowes, MD   40 mg at 02/24/15 0916  . zolpidem (AMBIEN) tablet 5 mg  5 mg Oral QHS PRN Oswald Hillock, MD   5 mg at 02/23/15 2308     Discharge Medications:    START taking these medications   Details  calcium carbonate (OS-CAL - DOSED IN MG OF ELEMENTAL CALCIUM) 1250 (500 Ca) MG tablet Take 1 tablet (500 mg of elemental calcium total) by mouth 3 (three) times daily with meals. Qty: 90 tablet, Refills: 3    hydrALAZINE (APRESOLINE) 10 MG tablet Take 1 tablet (10 mg total) by mouth 2 (two) times daily. Qty: 60 tablet, Refills: 2    torsemide (DEMADEX) 20 MG tablet Take 2 tablets (40 mg total) by mouth daily. Qty: 60 tablet, Refills: 2      CONTINUE these medications which have CHANGED   Details  zolpidem (AMBIEN) 10 MG tablet Take 1 tablet (10 mg total) by mouth at bedtime as needed for sleep. Qty: 30 tablet, Refills: 0      CONTINUE these medications which have NOT CHANGED   Details  carvedilol (COREG) 12.5 MG tablet Take 1 tablet (12.5 mg total) by mouth 2 (two) times daily. Qty: 60 tablet, Refills: 6    ezetimibe  (ZETIA) 10 MG tablet Take 1 tablet (10 mg total) by mouth daily. Qty: 90 tablet, Refills: 3    levothyroxine (SYNTHROID, LEVOTHROID) 25 MCG tablet Take 25 mcg by mouth daily before breakfast.     LORazepam (ATIVAN) 0.5 MG  tablet Take 1 tablet (0.5 mg total) by mouth 2 (two) times daily. Qty: 60 tablet, Refills: 2   Associated Diagnoses: Anxiety state    pantoprazole (PROTONIX) 40 MG tablet TAKE 1 TABLET BY MOUTH TWICE DAILY BEFORE A MEAL. Qty: 60 tablet, Refills: 5    sertraline (ZOLOFT) 50 MG tablet Take 100 mg by mouth daily.    acetaminophen (TYLENOL) 325 MG tablet Take 650 mg by mouth every 6 (six) hours as needed for mild pain.    ammonium lactate (AMLACTIN) 12 % cream Apply 1 g topically daily as needed for dry skin (apply to elbows).     nitroGLYCERIN (NITROSTAT) 0.3 MG SL tablet Place 0.3 mg under the tongue every 5 (five) minutes as needed for chest pain (MAX 3 TABLETS). Reported on 01/18/2015    ondansetron (ZOFRAN) 4 MG tablet Take 4 mg by mouth every 8 (eight) hours as needed for nausea or vomiting.    rosuvastatin (CRESTOR) 40 MG tablet TAKE 1 TABLET BY MOUTH ONCE DAILY. Qty: 30 tablet, Refills: 0      STOP taking these medications     apixaban (ELIQUIS) 5 MG TABS tablet      furosemide (LASIX) 20 MG tablet      sacubitril-valsartan (ENTRESTO) 24-26 MG      spironolactone (ALDACTONE) 25 MG tablet           Please see discharge summary for a list of discharge medications.  Relevant Imaging Results:  Relevant Lab Results:   Additional Information SSN [id number]  Ludwig Clarks, LCSW

## 2015-02-24 NOTE — Progress Notes (Signed)
Patient for d/c today to ALF bed at [facility] as pta. Patient agreeable to this plan- plan transfer via ALF staff. Eduard Clos, MSW, [place]

## 2015-02-27 ENCOUNTER — Ambulatory Visit (INDEPENDENT_AMBULATORY_CARE_PROVIDER_SITE_OTHER): Payer: Medicaid Other

## 2015-02-27 DIAGNOSIS — I5023 Acute on chronic systolic (congestive) heart failure: Secondary | ICD-10-CM | POA: Diagnosis not present

## 2015-02-27 DIAGNOSIS — Z95810 Presence of automatic (implantable) cardiac defibrillator: Secondary | ICD-10-CM

## 2015-02-27 NOTE — Progress Notes (Signed)
EPIC Encounter for ICM Monitoring  Patient Name: [name] is a 60 y.o. female Date: 02/27/2015 Primary Care Physican: [name], MD Primary Cardiologist: Aundra Dubin Electrophysiologist: Allred Dry Weight: 120 lbs       In the past month, have you:  1. Gained more than 2 pounds in a day or more than 5 pounds in a week? no  2. Had changes in your medications (with verification of current medications)? Yes, patient reported med changes since hospital discharge on 02/24/2015.  Started Torsemide 20 mg two tablets daily, Hydralazine 10 mg bid and Calcium Carbonate 1250 mg tid.  Following meds stopped at time of discharge: Entresto, Eliquis (due to heme positive stool & anemia), Spironolactone and Furosemide.    3. Had more shortness of breath than is usual for you? Yes, prior to hospitalization  4. Limited your activity because of shortness of breath? Yes, prior to hospitalization  5. Not been able to sleep because of shortness of breath? no  6. Had increased swelling in your feet or ankles? no  7. Had symptoms of dehydration (dizziness, dry mouth, increased thirst, decreased urine output) no  8. Had changes in sodium restriction? no  9. Been compliant with medication? Yes   ICM trend: 3 month view for 02/27/2015   ICM trend: 1 year view for 02/27/2015   Follow-up plan: ICM clinic phone appointment on 03/22/2015.  Spoke with patient and caregiver (verbal permission given).  [place] thoracic impedance below reference line since 02/08/2015 suggesting fluid retention.  Patient reported hospitalization from 02/14/2015 to 02/24/2015 due to severe infection from UTI.  She reported on admission, hospital said she was severely dehydrated and was given a lot of fluids.   She reported since discharge, 02/24/2015, she feels like she has some fluid in the abdomen area under ribs on left side, left arm and hand.  She currently has occasional SOB but weight remains stable since discharge at 120 lbs per her  scale.  Confirmed she is taking Torsemide 20 mg 2 tabs daily since discharge.       09/14/2014 K 3.6, Creatinine 1.15, BUN 15 10/15/2014 K 3.6, Creatinine 1.31, BUN 21 11/02/2014 K 4.5, Creatinine 1.27, BUN 18 11/18/2014 K 4.0, Creatinine 1.34, BUN 26 01/18/2015 K 4.3, Creatinine 1.26, BUN 20 02/14/2015 (hospital admission) K 5.5, Creatinine 7.97, BUN 65 02/24/2015 (hospital discharge)  K5.6, Creatinine 5.48, BUN 59  Advised would send to Dr Aundra Dubin and Dr Rayann Heman for review and recommendations if needed.  Appointment with Dr Aundra Dubin scheduled on 03/08/2015 for post hospital f/u.   Copy of note sent to patient's primary care physician, primary cardiologist, and device following physician.  Rosalene Billings, RN, CCM 02/27/2015 9:44 AM

## 2015-02-28 ENCOUNTER — Encounter (HOSPITAL_COMMUNITY): Payer: Self-pay | Attending: Family Medicine

## 2015-02-28 ENCOUNTER — Other Ambulatory Visit (HOSPITAL_COMMUNITY)
Admission: RE | Admit: 2015-02-28 | Discharge: 2015-02-28 | Disposition: A | Discharge status: Home / Self Care | Payer: Medicaid Other | Source: Other Acute Inpatient Hospital | Attending: Nephrology | Admitting: Nephrology

## 2015-02-28 ENCOUNTER — Encounter (HOSPITAL_COMMUNITY): Payer: Self-pay

## 2015-02-28 DIAGNOSIS — I501 Left ventricular failure: Secondary | ICD-10-CM | POA: Diagnosis present

## 2015-02-28 LAB — BASIC METABOLIC PANEL
Anion gap: 13 (ref 5–15)
BUN: 43 mg/dL — ABNORMAL HIGH (ref 6–20)
CALCIUM: 8.6 mg/dL — AB (ref 8.9–10.3)
CHLORIDE: 105 mmol/L (ref 101–111)
CO2: 26 mmol/L (ref 22–32)
CREATININE: 5.09 mg/dL — AB (ref 0.44–1.00)
GFR calc non Af Amer: 8 mL/min — ABNORMAL LOW (ref >60)
GFR, EST AFRICAN AMERICAN: 10 mL/min — AB (ref >60)
Glucose, Bld: 145 mg/dL — ABNORMAL HIGH (ref 65–99)
Potassium: 3.8 mmol/L (ref 3.5–5.1)
SODIUM: 144 mmol/L (ref 135–145)

## 2015-02-28 NOTE — Progress Notes (Signed)
Call to patient and advised Dr Aundra Dubin ordered her to take Torsemide 40 mg daily and to be sure she keeps her appointment with Dr Aundra Dubin on 03/08/2015 unless Dr Oleh Genin office calls with earlier appointment.  She verbalized understanding.

## 2015-02-28 NOTE — Progress Notes (Signed)
Continue torsemide 40 mg daily as impedance trending up.  She was only on lasix 20 qod prior to admission.  She needs followup in CHF clinic very soon. Nira Conn, can you arrange.

## 2015-03-02 ENCOUNTER — Encounter: Payer: Self-pay | Admitting: Gastroenterology

## 2015-03-02 ENCOUNTER — Ambulatory Visit (INDEPENDENT_AMBULATORY_CARE_PROVIDER_SITE_OTHER): Payer: Medicaid Other | Admitting: Nurse Practitioner

## 2015-03-02 ENCOUNTER — Encounter: Payer: Self-pay | Admitting: Nurse Practitioner

## 2015-03-02 ENCOUNTER — Ambulatory Visit: Payer: Self-pay | Admitting: Nurse Practitioner

## 2015-03-02 VITALS — BP 111/71 | HR 71 | Temp 97.5°F | Ht 63.0 in | Wt 122.2 lb

## 2015-03-02 DIAGNOSIS — K2990 Gastroduodenitis, unspecified, without bleeding: Secondary | ICD-10-CM

## 2015-03-02 DIAGNOSIS — D509 Iron deficiency anemia, unspecified: Secondary | ICD-10-CM

## 2015-03-02 DIAGNOSIS — K2970 Gastritis, unspecified, without bleeding: Secondary | ICD-10-CM | POA: Diagnosis not present

## 2015-03-02 LAB — CBC
HEMATOCRIT: 32.1 % — AB (ref 36.0–46.0)
HEMOGLOBIN: 10.4 g/dL — AB (ref 12.0–15.0)
MCH: 29.6 pg (ref 26.0–34.0)
MCHC: 32.4 g/dL (ref 30.0–36.0)
MCV: 91.5 fL (ref 78.0–100.0)
MPV: 11.3 fL (ref 8.6–12.4)
Platelets: 393 10*3/uL (ref 150–400)
RBC: 3.51 MIL/uL — ABNORMAL LOW (ref 3.87–5.11)
RDW: 13.8 % (ref 11.5–15.5)
WBC: 6.4 10*3/uL (ref 4.0–10.5)

## 2015-03-02 NOTE — Progress Notes (Signed)
CC'D TO PCP °

## 2015-03-02 NOTE — Assessment & Plan Note (Signed)
History of demonstrated gastritis and duodenitis likely complicating her anemia. She remains on Protonix with good control of her GERD symptoms. Recommend continue Protonix and follow-up in 6 months. Continue to see hematology and cardiology.

## 2015-03-02 NOTE — Assessment & Plan Note (Signed)
Patient with a history of iron deficiency anemia status post extensive GI evaluation. Her anemia is likely multifactorial including gastritis, duodenitis, anticoagulation for atrial fibrillation, kidney disease. She sees hematology for iron and blood as needed. She is recently hospitalized for sepsis and exacerbation of heart failure. At this time she had acute on chronic anemia as well. Her anticoagulation was discontinued at this time and she is scheduled to see cardiology next week at which point a decision will be made whether to reinstate anticoagulation. I will check her CBC today to follow-up on hospitalization levels of hemoglobin. Continue Protonix as noted above. Continue to see hematology as scheduled. Return for follow-up in 6 months.

## 2015-03-02 NOTE — Patient Instructions (Addendum)
1. Have your blood work drawn when you're able to. 2. Continue taking your Protonix. 3. Follow-up with cardiology and hematology as they recommend. 4. Return for follow-up here in 6 months. 5. If you have any worsening symptoms call us and we can see you sooner.

## 2015-03-02 NOTE — Progress Notes (Signed)
Referring Provider: Boykin Nearing, MD Primary Care Physician:  [name], MD Primary GI:  Dr. Oneida Alar  Chief Complaint  Patient presents with  . Follow-up    HPI:   61 year old female presents for follow-up on anemia on anticoagulation. Last visit with our office on 08/30/2014 at which point it was noted she has had a complete GI workup including colonoscopy and Givens capsule. Colonoscopy was completed on 03/29/2014 which found 8 colon polyps status post removal, severe diverticulosis noted transverse colon and right colon, small internal hemorrhoids. Recommend next colonoscopy in 1-3 years. Pathology showed all polyps were tubular adenoma or hyperplastic. Givens capsule showed gastritis/duodenitis and likely source of chronic blood loss due to Xarelto and Eliquis. Recommended referral to hematology for further evaluation, avoid ASA/NSAIDs, continue Protonix. She has been followed by hematology. Her hemoglobin has fluctuated between 7.8 and 11.7 since December 2016. She was admitted to the hospital on 02/14/2015 for sepsis, acute on chronic systolic heart failure and acute on chronic anemia at which point she had her acute drop to hgb of 7.8. She received 2 units of PRBCs while hospitalized. She is receiving ongoing hematology evaluation, iron support, and transfusions as necessary.  Today she states she was in the hospital "for a while." Had diarrhea in the hospital but this is easing off now. Also with LLQ pain which is minimal to moderate. States she's lost weight since hospitalization going from 137 to 122 pounds. She was admitted for CHF exacerbation and was diuresed likely part of the weight loss. Denies any other abdominal pain, vomiting. Has nausea which is managed well with Zofran. Energy level is slowly improving day by day since hospitalization. Occasional shortness of breath with "overdoing it" but she knows her limits and tries to stay within them. Is scheduled to see Dr.  Algernon Huxley next week. States GERD symptoms well controlled, continues to take bid Protonix. Denies chest pain, dyspnea, dizziness, lightheadedness, syncope, near syncope. Denies any other upper or lower GI symptoms. States "actually, I feel pretty good" from a GI standpoint.  States her anticoagulation is currently on hold, cardiology to make decision about whether to continue it when she sees them next week.   Past Medical History  Diagnosis Date  . Diverticulosis   . Pancreatitis 1980, 2015    in the setting of ETOH  . chronic systolic heart failure     a. ECHO (05/08/13): EF 20-25%, diff HK with akinesis of basal inferior wall, grade 1 DD, trivial MR, trivial TR b) RHC (05/06/13): RA 7, RV 30/2, PA 31/19 (24), PCWP 10, Fick CO/CI: 2.9/1.74, Thermo CO/Ci: 2.4/1.4, PA sat 53%  . Ischemic cardiomyopathy   . CAD (coronary artery disease)     a. LHC (04/2013): Chronically occluded LAD, moderate dz in large OM1 and severe dz and small posterior lateral vessel    . Persistent atrial fibrillation ([place]) 2015  . Suicidal overdose ([place]) 10/03/13    Overdoes on Beta Blockers & Xarelto [facility])  . Anxiety 2015   . Psoriasis 1968  . GERD (gastroesophageal reflux disease) 2015   . Hyperlipidemia 2015  . Hypertension 2015  . ETOH abuse 1981    started abusing alcohol at age 44, quit   . Hypothyroidism   . PONV (postoperative nausea and vomiting)   . Mass of right breast on mammogram 07/14/2014  . AICD (automatic cardioverter/defibrillator) present   . Myocardial infarction ([place])     "I've had many" (09/13/2014)  . Anginal pain ([place])   .  Anemia   . History of blood transfusion     "related to losing blood from somewhere; never found out from where"  . Stroke [facility]) 2011    left side weakness, minimal  . Depression   . 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/2014    Past Surgical History  Procedure Laterality Date  . Colostomy takedown  2007  . Abdominal  exploration surgery  2007  . Esophagogastroduodenoscopy (egd) with propofol N/A 11/22/2013    Dr. Oneida Alar: gastritis and duodenitis, negative H.pylori  . Biopsy N/A 11/22/2013    Procedure: GASTRIC BIOPSIES;  Surgeon: Danie Binder, MD;  Location: [facility] ORS;  Service: Endoscopy;  Laterality: N/A;  . Left and right heart catheterization with coronary angiogram N/A 05/10/2013    Procedure: LEFT AND RIGHT HEART CATHETERIZATION WITH CORONARY ANGIOGRAM;  Surgeon: [name], MD;  Location: [facility] CATH LAB;  Service: Cardiovascular;  Laterality: N/A;  . Left heart catheterization with coronary angiogram N/A 09/03/2013    Procedure: LEFT HEART CATHETERIZATION WITH CORONARY ANGIOGRAM;  Surgeon: Sinclair Grooms, MD;  Location: [facility] CATH LAB;  Service: Cardiovascular;  Laterality: N/A;  . Colonoscopy with propofol N/A 03/29/2014    SLF: 1. No definite source for anemia identified 2. 8 colon polyps removed 3. Severe diverticulosis noted the transverse colon  and right colon 4. Small internal hemorrohids.   . Polypectomy N/A 03/29/2014    Procedure: POLYPECTOMY;  Surgeon: Danie Binder, MD;  Location: [facility] ORS;  Service: Endoscopy;  Laterality: N/A;  ascending colon, sigmoid colon x4, rectal x3  . Left and right heart catheterization with coronary angiogram N/A 04/14/2014    Procedure: LEFT AND RIGHT HEART CATHETERIZATION WITH CORONARY ANGIOGRAM;  Surgeon: Larey Dresser, MD;  Location: [facility] CATH LAB;  Service: Cardiovascular;  Laterality: N/A;  . Agile capsule N/A 06/02/2014    Procedure: AGILE CAPSULE;  Surgeon: Danie Binder, MD;  Location: [facility] ENDO SUITE;  Service: Endoscopy;  Laterality: N/A;  0800  . Givens capsule study N/A 06/10/2014    Procedure: GIVENS CAPSULE STUDY;  Surgeon: Danie Binder, MD;  Location: [facility] ENDO SUITE;  Service: Endoscopy;  Laterality: N/A;  0700  . Ep implantable device N/A 09/13/2014    Procedure: ICD Implant;  Surgeon: [name], MD;  Location: [place] CV LAB;  Service:  Cardiovascular;  Laterality: N/A;  . Colostomy reversal  ?2009  . Ileostomy  ?2009  . Ileostomy closure  ?2010  . Colon surgery    . Cardiac catheterization    . Ep implantable device  09/13/14    ICD St Jude, placed     Current Outpatient Prescriptions  Medication Sig Dispense Refill  . acetaminophen (TYLENOL) 325 MG tablet Take 650 mg by mouth every 6 (six) hours as needed for mild pain.    Marland Kitchen ammonium lactate (AMLACTIN) 12 % cream Apply 1 g topically daily as needed for dry skin (apply to elbows).     . calcium carbonate (OS-CAL - DOSED IN MG OF ELEMENTAL CALCIUM) 1250 (500 Ca) MG tablet Take 1 tablet (500 mg of elemental calcium total) by mouth 3 (three) times daily with meals. 90 tablet 3  . carvedilol (COREG) 12.5 MG tablet Take 1 tablet (12.5 mg total) by mouth 2 (two) times daily. 60 tablet 6  . ezetimibe (ZETIA) 10 MG tablet Take 1 tablet (10 mg total) by mouth daily. 90 tablet 3  . hydrALAZINE (APRESOLINE) 10 MG tablet Take 1 tablet (10 mg  total) by mouth 2 (two) times daily. 60 tablet 2  . levothyroxine (SYNTHROID, LEVOTHROID) 25 MCG tablet Take 25 mcg by mouth daily before breakfast.     . LORazepam (ATIVAN) 0.5 MG tablet Take 1 tablet (0.5 mg total) by mouth 2 (two) times daily. 60 tablet 2  . nitroGLYCERIN (NITROSTAT) 0.3 MG SL tablet Place 0.3 mg under the tongue every 5 (five) minutes as needed for chest pain (MAX 3 TABLETS). Reported on 01/18/2015    . ondansetron (ZOFRAN) 4 MG tablet Take 4 mg by mouth every 8 (eight) hours as needed for nausea or vomiting.    . pantoprazole (PROTONIX) 40 MG tablet TAKE 1 TABLET BY MOUTH TWICE DAILY BEFORE A MEAL. 60 tablet 5  . rosuvastatin (CRESTOR) 40 MG tablet TAKE 1 TABLET BY MOUTH ONCE DAILY. 30 tablet 0  . sertraline (ZOLOFT) 50 MG tablet Take 100 mg by mouth daily.    Marland Kitchen torsemide (DEMADEX) 20 MG tablet Take 2 tablets (40 mg total) by mouth daily. 60 tablet 2  . zolpidem (AMBIEN) 10 MG tablet Take 1 tablet (10 mg total) by mouth at  bedtime as needed for sleep. 30 tablet 0   No current facility-administered medications for this visit.    Allergies as of 03/02/2015 - Review Complete 03/02/2015  Allergen Reaction Noted  . Morphine and related Other (See Comments) 09/24/2013  . Penicillins Anaphylaxis and Rash 05/06/2013    Family History  Problem Relation Age of Onset  . CAD Father 69    MI  . CAD Mother 25    MI  . Alcohol abuse Mother   . Colon cancer Mother     in her 54s  . CAD Sister 41    Stent  . Diabetes Sister   . CAD Brother 40    MI  . Cancer Neg Hx     Social History   Social History  . Marital Status: Married    Spouse Name: N/A  . Number of Children: 2  . Years of Education: 10   Occupational History  . Unemployed     Social History Main Topics  . Smoking status: Current Some Day Smoker -- 0.12 packs/day for 48 years    Types: Cigarettes  . Smokeless tobacco: Never Used  . Alcohol Use: No     Comment: last drink in [id number], former alcoholic  . Drug Use: No  . Sexual Activity: No   Other Topics Concern  . None   Social History Narrative   Lived previously with her spouse in an [place].     Son and daughter   Daughter in [place]   Son in [place]   Two grandchildren.       Presently in a group home [place], moved in 10/13/13.           Review of Systems: General: Negative for anorexia, weight loss, fever, chills. Eyes: Negative for vision changes.  ENT: Negative for hoarseness, difficulty swallowing. CV: Negative for chest pain, angina, palpitations, peripheral edema.  Respiratory: Negative for dyspnea at rest, cough, sputum, wheezing.  GI: See history of present illness. Endo: Negative for unusual weight change.  Heme: Negative for bruising or bleeding.   Physical Exam: BP 111/71 mmHg  Pulse 71  Temp(Src) 97.5 F (36.4 C) (Oral)  Ht 5\' 3"  (1.6 m)  Wt 122 lb 3.2 oz (55.43 kg)  BMI 21.65 kg/m2  LMP 01/22/1988 (Approximate) General:   Alert and  oriented. Pleasant and cooperative. Well-nourished and  well-developed.  Head:  Normocephalic and atraumatic. Eyes:  Without icterus, sclera clear and conjunctiva pink.  Ears:  Normal auditory acuity. Cardiovascular:  S1, S2 present without murmurs appreciated. Rate and rhythm regular. Extremities without clubbing or edema. Respiratory:  Clear to auscultation bilaterally. No wheezes, rales, or rhonchi. No distress.  Gastrointestinal:  +BS, soft, non-tender and non-distended. No HSM noted. No guarding or rebound. No masses appreciated.  Rectal:  Deferred  Neurologic:  Alert and oriented x4;  grossly normal neurologically. Psych:  Alert and cooperative. Normal mood and affect.    03/02/2015 11:22 AM

## 2015-03-08 ENCOUNTER — Encounter (HOSPITAL_COMMUNITY): Payer: Self-pay

## 2015-03-08 ENCOUNTER — Ambulatory Visit (HOSPITAL_COMMUNITY)
Admit: 2015-03-08 | Discharge: 2015-03-08 | Disposition: A | Discharge status: Home / Self Care | Payer: Medicaid Other | Source: Ambulatory Visit | Attending: Cardiology | Admitting: Cardiology

## 2015-03-08 VITALS — BP 112/72 | HR 65 | Wt 118.2 lb

## 2015-03-08 DIAGNOSIS — I69354 Hemiplegia and hemiparesis following cerebral infarction affecting left non-dominant side: Secondary | ICD-10-CM | POA: Diagnosis not present

## 2015-03-08 DIAGNOSIS — Z79899 Other long term (current) drug therapy: Secondary | ICD-10-CM | POA: Diagnosis not present

## 2015-03-08 DIAGNOSIS — F419 Anxiety disorder, unspecified: Secondary | ICD-10-CM | POA: Diagnosis not present

## 2015-03-08 DIAGNOSIS — I255 Ischemic cardiomyopathy: Secondary | ICD-10-CM

## 2015-03-08 DIAGNOSIS — I5022 Chronic systolic (congestive) heart failure: Secondary | ICD-10-CM | POA: Diagnosis present

## 2015-03-08 DIAGNOSIS — E785 Hyperlipidemia, unspecified: Secondary | ICD-10-CM | POA: Insufficient documentation

## 2015-03-08 DIAGNOSIS — K2990 Gastroduodenitis, unspecified, without bleeding: Secondary | ICD-10-CM

## 2015-03-08 DIAGNOSIS — F1010 Alcohol abuse, uncomplicated: Secondary | ICD-10-CM | POA: Diagnosis not present

## 2015-03-08 DIAGNOSIS — Z915 Personal history of self-harm: Secondary | ICD-10-CM | POA: Insufficient documentation

## 2015-03-08 DIAGNOSIS — F17210 Nicotine dependence, cigarettes, uncomplicated: Secondary | ICD-10-CM | POA: Diagnosis not present

## 2015-03-08 DIAGNOSIS — F329 Major depressive disorder, single episode, unspecified: Secondary | ICD-10-CM | POA: Insufficient documentation

## 2015-03-08 DIAGNOSIS — I252 Old myocardial infarction: Secondary | ICD-10-CM | POA: Insufficient documentation

## 2015-03-08 DIAGNOSIS — Z8249 Family history of ischemic heart disease and other diseases of the circulatory system: Secondary | ICD-10-CM | POA: Diagnosis not present

## 2015-03-08 DIAGNOSIS — I132 Hypertensive heart and chronic kidney disease with heart failure and with stage 5 chronic kidney disease, or end stage renal disease: Secondary | ICD-10-CM | POA: Insufficient documentation

## 2015-03-08 DIAGNOSIS — N185 Chronic kidney disease, stage 5: Secondary | ICD-10-CM

## 2015-03-08 DIAGNOSIS — K219 Gastro-esophageal reflux disease without esophagitis: Secondary | ICD-10-CM | POA: Diagnosis not present

## 2015-03-08 DIAGNOSIS — I2510 Atherosclerotic heart disease of native coronary artery without angina pectoris: Secondary | ICD-10-CM

## 2015-03-08 DIAGNOSIS — Z95810 Presence of automatic (implantable) cardiac defibrillator: Secondary | ICD-10-CM | POA: Diagnosis not present

## 2015-03-08 DIAGNOSIS — I480 Paroxysmal atrial fibrillation: Secondary | ICD-10-CM | POA: Diagnosis not present

## 2015-03-08 DIAGNOSIS — Z720 Tobacco use: Secondary | ICD-10-CM | POA: Diagnosis not present

## 2015-03-08 DIAGNOSIS — I2583 Coronary atherosclerosis due to lipid rich plaque: Secondary | ICD-10-CM

## 2015-03-08 DIAGNOSIS — E039 Hypothyroidism, unspecified: Secondary | ICD-10-CM | POA: Diagnosis not present

## 2015-03-08 DIAGNOSIS — D509 Iron deficiency anemia, unspecified: Secondary | ICD-10-CM | POA: Diagnosis not present

## 2015-03-08 DIAGNOSIS — K2970 Gastritis, unspecified, without bleeding: Secondary | ICD-10-CM

## 2015-03-08 MED ORDER — ISOSORBIDE MONONITRATE ER 30 MG PO TB24: 30.0000 mg | ORAL_TABLET | Freq: Every day | ORAL | Status: DC

## 2015-03-08 NOTE — Progress Notes (Signed)
Patient ID: [name], female   DOB: 06/13/1955, 60 y.o.   MRN: [id number] PCP: N/A Cardiologist: Dr. Aundra Dubin  HPI: [name] is a 60 y/o female with a history of ETOH abuse, ICM, HTN, stroke and chronic systolic heart failure.  Admitted to [facility] 05/06/13 for pancreatitis. She had severely reduced systolic function with an EF of 20-25%. 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 Discharge weight was 140 pounds.   Admitted to [facility] 05/2013 with A fib RVR. Started on amiodarone 400 mg twice a day and Xarelto 20 mg daily. Chemically converted to NSR.  Admitted to [facility]  08/2013 with chest pain. Troponin was elevated. LHC was unchanged from April 2015 so she had CMRI that showed LAD territory was not viable, EF 28%. Plan to continue to treat medically.   Admitted 9/13 through 10/13/13 after attempted suicide. Overdosed on all HF medications. Discharged off carvedilol, lasix, lisinopril, xarelto and digoxin. Discharge weight 120 pounds. Discharged to [facility] which is a group home.  She has been doing much better since that time and is on sertraline.  Had colonoscopy on 03/29/14 with 8 polyps and diverticulosis. She developed increased dyspnea and had LHC/RHC in 3/16 showing mean RA 2, PA 45/21 (mean 34), PCWP mean 21, CI 2.77; LAD totally occluded with some collaterals from RCA, 40-50% OM1, 90% complex ostial PLV. No significant change on coronary angiography.  I thought that her dyspnea may have been due to worsened anemia.    In 8/16, she had St Jude ICD implanted.   Admitted to [facility] 1/24 through 02/24/2015. Treated for sepsis secondary to [facility]. On admit creatinine was  7.7. Entresto and spiro stopped. Due to anemia she was transfused 2UPRBCs. Blood loss thought to be from gastritis and duodenitis as well chronic anticoagulants. Eliquis was stopped. Discharged with nephrology and gastrology.  Discharge weight was 134  pounds.   She returns for HF follow up. Yesterday she saw Nephrology and she is currently stable. She has no plans for dialysis at this time. Denies SOB/PND/Orthopnea. Able to walk up steps slowly. Weight at home 118 pounds.  Taking all medications. No BRBPR. Smoking 3-4 cigarettes per day. Still living at [facility].   ECHO 09/02/13 EF 25-30%  ECHO 03/2014: EF 25-30%  ECHO 02/15/2015: EF 25-30%.   Labs 05/15/13 K 3.7 Creatinine 0.97 Labs 06/02/13 K 3.9 Creatinine 1.0 Dig level 1.3 --> cut back dig to 0.0625 mg Labs 06/08/13 K 4.7 Creatinine 1.1 Dig level 0.4  Labs 06/19/13: K 4.1, creatinine 1.35 Labs 08/05/13: K 4.5, creatinine 1.17, AST 25, ALT 28, cholesterol 188, TG 185, HDL 41, LDL 110 Labs 08/17/13: K 4.9 Creatinine 2.0  Labs 09/06/13: Dig level 0.9 K 4.5 Creatinine 1.13 Labs 10/13/13 K 5.1 Creatinine 2.1 Labs 10/20/13 K 4.1 Creatinine 1.32 Labs 01/27/2014: Hgb 9.9, LDL 108  Labs 3/16: K 4.4, creatinine 1.08, hgb 8.6 Labs 9/16: K 3.6, creatinine 1.31, HCT 34.8, TnI 0.07 max.  Labs 10/16: K 4.5, creatinine 1.27 => 1.34, LDL 111, HDL 36 Labs (12/16): HCT 39.9 LLabs    SH: Lives in a group home. Unemployed. Smoker . Does not drink  alcohol   FH: Father MI at the age of 37  ROS: All systems negative except as listed in HPI, PMH and Problem List.  Past Medical History  Diagnosis Date  . Diverticulosis   . Pancreatitis 1980, 2015    in  the setting of ETOH  . chronic systolic heart failure     a. ECHO (05/08/13): EF 20-25%, diff HK with akinesis of basal inferior wall, grade 1 DD, trivial MR, trivial TR b) RHC (05/06/13): RA 7, RV 30/2, PA 31/19 (24), PCWP 10, Fick CO/CI: 2.9/1.74, Thermo CO/Ci: 2.4/1.4, PA sat 53%  . Ischemic cardiomyopathy   . CAD (coronary artery disease)     a. LHC (04/2013): Chronically occluded LAD, moderate dz in large OM1 and severe dz and small posterior lateral vessel    . Persistent atrial fibrillation ([place]) 2015  . Suicidal overdose ([place]) 10/03/13     Overdoes on Beta Blockers & Xarelto [facility])  . Anxiety 2015   . Psoriasis 1968  . GERD (gastroesophageal reflux disease) 2015   . Hyperlipidemia 2015  . Hypertension 2015  . ETOH abuse 1981    started abusing alcohol at age 53, quit   . Hypothyroidism   . PONV (postoperative nausea and vomiting)   . Mass of right breast on mammogram 07/14/2014  . AICD (automatic cardioverter/defibrillator) present   . Myocardial infarction ([place])     "I've had many" (09/13/2014)  . Anginal pain ([place])   . Anemia   . History of blood transfusion     "related to losing blood from somewhere; never found out from where"  . Stroke [facility]) 2011    left side weakness, minimal  . Depression   . 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/2014    Current Outpatient Prescriptions  Medication Sig Dispense Refill  . acetaminophen (TYLENOL) 325 MG tablet Take 650 mg by mouth every 6 (six) hours as needed for mild pain.    Marland Kitchen ammonium lactate (AMLACTIN) 12 % cream Apply 1 g topically daily as needed for dry skin (apply to elbows).     . calcium carbonate (OS-CAL - DOSED IN MG OF ELEMENTAL CALCIUM) 1250 (500 Ca) MG tablet Take 1 tablet (500 mg of elemental calcium total) by mouth 3 (three) times daily with meals. 90 tablet 3  . carvedilol (COREG) 12.5 MG tablet Take 1 tablet (12.5 mg total) by mouth 2 (two) times daily. 60 tablet 6  . ezetimibe (ZETIA) 10 MG tablet Take 1 tablet (10 mg total) by mouth daily. 90 tablet 3  . hydrALAZINE (APRESOLINE) 10 MG tablet Take 1 tablet (10 mg total) by mouth 2 (two) times daily. 60 tablet 2  . levothyroxine (SYNTHROID, LEVOTHROID) 25 MCG tablet Take 25 mcg by mouth daily before breakfast.     . LORazepam (ATIVAN) 0.5 MG tablet Take 1 tablet (0.5 mg total) by mouth 2 (two) times daily. 60 tablet 2  . pantoprazole (PROTONIX) 40 MG tablet TAKE 1 TABLET BY MOUTH TWICE DAILY BEFORE A MEAL. 60 tablet 5  . rosuvastatin (CRESTOR) 40 MG tablet TAKE 1 TABLET BY  MOUTH ONCE DAILY. 30 tablet 0  . sertraline (ZOLOFT) 50 MG tablet Take 100 mg by mouth daily.    Marland Kitchen torsemide (DEMADEX) 20 MG tablet Take 2 tablets (40 mg total) by mouth daily. 60 tablet 2  . zolpidem (AMBIEN) 10 MG tablet Take 1 tablet (10 mg total) by mouth at bedtime as needed for sleep. 30 tablet 0  . nitroGLYCERIN (NITROSTAT) 0.3 MG SL tablet Place 0.3 mg under the tongue every 5 (five) minutes as needed for chest pain (MAX 3 TABLETS). Reported on 03/08/2015    . ondansetron (ZOFRAN) 4 MG tablet Take 4 mg by mouth every 8 (eight) hours as  needed for nausea or vomiting. Reported on 03/08/2015     No current facility-administered medications for this encounter.    Filed Vitals:   03/08/15 0919  BP: 112/72  Pulse: 65  Weight: 118 lb 4 oz (53.638 kg)  SpO2: 100%    PHYSICAL EXAM: General: NAD HEENT: normal Neck: supple. JVP 5-6.  Carotids 2+ bilaterally; no bruits. No lymphadenopathy or thryomegaly appreciated. Cor: PMI normal. Regular rate & rhythm. No rubs, gallops or murmurs. Lungs: clear Abdomen: soft, nontender, nondistended. No hepatosplenomegaly. No bruits or masses. Good bowel sounds. Extremities: no cyanosis, clubbing, rash, edema Neuro: alert & orientedx3, cranial nerves grossly intact. Moves all 4 extremities w/o difficulty. Affect pleasant.  ASSESSMENT & PLAN: 1. Chronic Systolic Heart Failure: Primarily ischemic cardiomyopathy though ETOH may play a role. EF 25-30% (08/2013).  Cardiac MRI in August 2015 showed no viability in the LAD territory with EF 28%. Most recent ECHO (01/2015) EF 25-30%. NYHA class II symptoms.  Now s/p St Jude ICD.   Volume status stable. Continue torsemide 40 mg daily. Euvolemic on exam. I reviewed BMET from February 7th.  - Off entresto and spiro with CKD .  - Continue coreg 12.5 mg twice a day. - Continue hydralazine 10 mg twice a day. Add imdur 30 mg daily.  -Continue daily weights.  2. CAD: LHC with chronically occluded LAD and severe disease  in small PLV.  No change on 3/16 cath compared to prior. cMRI showed no viability in LAD territory.    - Not on aspirin or apixaban with ongoing bleeding issues.    3. Current smoker: Encouraged again to stop smoking.  She is working on it.   4. Former Alcohol Abuse: Quit April 2015.  5. AF: CHADS Vasc Score 4 . Paroxysmal. Regular pulse. off amiodarone since July. Off apixaban due to recent GI bleed. For now I keep her off due to ongoing bleeding issues.    6. Suicide Attempt:  9/15 suicide attempt via overdose.  She is now on sertraline and doing much better.  7. Hyperlipidemia: Goal LDL < 70. Check lipids today.  8. Fe deficiency anemia: Gastritis/duodenitis.  On PPI. Off eliquis due to recent GI bleed. She has follow up with GI in 2 months. 9. CKD Stage V- Creatinine 5.0 Followed closely by Nephrology. No plan for dialysis yet.   Follow up in 4 weeks with Dr Aundra Dubin.      [name] Schow NP-C  03/08/2015

## 2015-03-08 NOTE — Patient Instructions (Signed)
Follow up in 4 weeks with NP  Take Imdur 30 mg daily   Do the following things EVERYDAY: 1) Weigh yourself in the morning before breakfast. Write it down and keep it in a log. 2) Take your medicines as prescribed 3) Eat low salt foods-Limit salt (sodium) to 2000 mg per day.  4) Stay as active as you can everyday 5) Limit all fluids for the day to less than 2 liters

## 2015-03-16 ENCOUNTER — Encounter: Payer: Self-pay | Admitting: Internal Medicine

## 2015-03-22 ENCOUNTER — Ambulatory Visit (INDEPENDENT_AMBULATORY_CARE_PROVIDER_SITE_OTHER): Payer: Medicaid Other | Admitting: *Deleted

## 2015-03-22 ENCOUNTER — Other Ambulatory Visit: Payer: Self-pay | Admitting: Cardiology

## 2015-03-22 DIAGNOSIS — I255 Ischemic cardiomyopathy: Secondary | ICD-10-CM

## 2015-03-22 DIAGNOSIS — Z95810 Presence of automatic (implantable) cardiac defibrillator: Secondary | ICD-10-CM

## 2015-03-22 DIAGNOSIS — I5022 Chronic systolic (congestive) heart failure: Secondary | ICD-10-CM

## 2015-03-22 NOTE — Progress Notes (Signed)
Remote ICD transmission.   

## 2015-03-24 ENCOUNTER — Ambulatory Visit (HOSPITAL_COMMUNITY)
Admission: RE | Admit: 2015-03-24 | Discharge: 2015-03-24 | Disposition: A | Discharge status: Home / Self Care | Payer: Medicaid Other | Source: Ambulatory Visit | Attending: Cardiology | Admitting: Cardiology

## 2015-03-24 DIAGNOSIS — N185 Chronic kidney disease, stage 5: Secondary | ICD-10-CM | POA: Insufficient documentation

## 2015-03-24 LAB — LIPID PANEL
CHOLESTEROL: 141 mg/dL (ref 0–200)
HDL: 34 mg/dL — ABNORMAL LOW (ref >40)
LDL Cholesterol: 75 mg/dL (ref 0–99)
TRIGLYCERIDES: 162 mg/dL — AB (ref <150)
Total CHOL/HDL Ratio: 4.1 RATIO
VLDL: 32 mg/dL (ref 0–40)

## 2015-03-24 LAB — HEPATIC FUNCTION PANEL
ALK PHOS: 66 U/L (ref 38–126)
ALT: 31 U/L (ref 14–54)
AST: 45 U/L — AB (ref 15–41)
Albumin: 4.1 g/dL (ref 3.5–5.0)
BILIRUBIN TOTAL: 0.6 mg/dL (ref 0.3–1.2)
Total Protein: 8.2 g/dL — ABNORMAL HIGH (ref 6.5–8.1)

## 2015-03-24 NOTE — Progress Notes (Signed)
EPIC Encounter for ICM Monitoring  Patient Name: [name] is a 60 y.o. female Date: 03/24/2015 Primary Care Physican: [name], MD Primary Cardiologist: Aundra Dubin Electrophysiologist: Allred Dry Weight: 118 lbs       In the past month, have you:  1. Gained more than 2 pounds in a day or more than 5 pounds in a week? no  2. Had changes in your medications (with verification of current medications)? no  3. Had more shortness of breath than is usual for you? no  4. Limited your activity because of shortness of breath? no  5. Not been able to sleep because of shortness of breath? no  6. Had increased swelling in your feet or ankles? no  7. Had symptoms of dehydration (dizziness, dry mouth, increased thirst, decreased urine output) no  8. Had changes in sodium restriction? no  9. Been compliant with medication? Yes   ICM trend: 3 month view for 03/22/2015   ICM trend: 1 year view for 03/22/2015   Follow-up plan: ICM clinic phone appointment 05/08/2015 and appointment with Dr Aundra Dubin on 04/05/2015.  Corvue thoracic impedance and reference line have trended up suggesting no fluid accumulation at this time.  She stated she is doing well and denied any problems at this time.  Encouraged her to drink up to 64 oz daily.  Encouraged her to call if she has any fluid symptoms.  No changes today.    Copy of note sent to patient's primary care physician, primary cardiologist, and device following physician.  Rosalene Billings, RN, CCM 03/24/2015 2:35 PM

## 2015-03-28 ENCOUNTER — Telehealth (HOSPITAL_COMMUNITY): Payer: Self-pay | Admitting: *Deleted

## 2015-03-28 NOTE — Telephone Encounter (Signed)
Received call from Arbie Cookey, RN w/[facility] she states pt's BP has been running low lately, today it was 88/56.  She states pt has fallen 3 times in past 2 weeks due to dizziness.  This started about 3-4 weeks ago.  Verified meds pt is taking everything as ordered, including Imdur 30 mg which was added 2/15 at OV.  She also states pt's HR has been around 100 for past week which is new for pt, it usually runs in the 60 s.  Discussed w/Dr Aundra Dubin, he would like pt to stop Isosorbide and come in for nurse visit with EKG.  Arbie Cookey is aware and states orders and transportation have to go through pt's group home, phone [phone], fax [phone].  Faxed order to d/c imdur to them.  Called and discuss pt coming for EKG, they will try to bring her on [place]

## 2015-03-29 ENCOUNTER — Other Ambulatory Visit (HOSPITAL_COMMUNITY): Payer: Self-pay | Admitting: *Deleted

## 2015-03-29 ENCOUNTER — Ambulatory Visit (HOSPITAL_COMMUNITY)
Admission: RE | Admit: 2015-03-29 | Discharge: 2015-03-29 | Disposition: A | Discharge status: Home / Self Care | Payer: Medicaid Other | Source: Ambulatory Visit | Attending: Cardiology | Admitting: Cardiology

## 2015-03-29 VITALS — BP 108/68 | HR 80 | Resp 20 | Wt 116.5 lb

## 2015-03-29 DIAGNOSIS — I5023 Acute on chronic systolic (congestive) heart failure: Secondary | ICD-10-CM | POA: Insufficient documentation

## 2015-03-29 DIAGNOSIS — Z79899 Other long term (current) drug therapy: Secondary | ICD-10-CM | POA: Diagnosis not present

## 2015-03-29 DIAGNOSIS — I2510 Atherosclerotic heart disease of native coronary artery without angina pectoris: Secondary | ICD-10-CM | POA: Insufficient documentation

## 2015-03-29 DIAGNOSIS — Z95810 Presence of automatic (implantable) cardiac defibrillator: Secondary | ICD-10-CM | POA: Insufficient documentation

## 2015-03-29 DIAGNOSIS — I252 Old myocardial infarction: Secondary | ICD-10-CM | POA: Insufficient documentation

## 2015-03-29 DIAGNOSIS — Z8673 Personal history of transient ischemic attack (TIA), and cerebral infarction without residual deficits: Secondary | ICD-10-CM | POA: Insufficient documentation

## 2015-03-29 DIAGNOSIS — N185 Chronic kidney disease, stage 5: Secondary | ICD-10-CM | POA: Diagnosis not present

## 2015-03-29 DIAGNOSIS — I447 Left bundle-branch block, unspecified: Secondary | ICD-10-CM | POA: Insufficient documentation

## 2015-03-29 DIAGNOSIS — F419 Anxiety disorder, unspecified: Secondary | ICD-10-CM | POA: Insufficient documentation

## 2015-03-29 DIAGNOSIS — K219 Gastro-esophageal reflux disease without esophagitis: Secondary | ICD-10-CM | POA: Diagnosis not present

## 2015-03-29 DIAGNOSIS — Z8249 Family history of ischemic heart disease and other diseases of the circulatory system: Secondary | ICD-10-CM | POA: Insufficient documentation

## 2015-03-29 DIAGNOSIS — E039 Hypothyroidism, unspecified: Secondary | ICD-10-CM | POA: Diagnosis not present

## 2015-03-29 DIAGNOSIS — I132 Hypertensive heart and chronic kidney disease with heart failure and with stage 5 chronic kidney disease, or end stage renal disease: Secondary | ICD-10-CM | POA: Insufficient documentation

## 2015-03-29 DIAGNOSIS — F1010 Alcohol abuse, uncomplicated: Secondary | ICD-10-CM | POA: Diagnosis not present

## 2015-03-29 DIAGNOSIS — F329 Major depressive disorder, single episode, unspecified: Secondary | ICD-10-CM | POA: Insufficient documentation

## 2015-03-29 DIAGNOSIS — I5022 Chronic systolic (congestive) heart failure: Secondary | ICD-10-CM | POA: Diagnosis not present

## 2015-03-29 DIAGNOSIS — F17210 Nicotine dependence, cigarettes, uncomplicated: Secondary | ICD-10-CM | POA: Diagnosis not present

## 2015-03-29 DIAGNOSIS — I255 Ischemic cardiomyopathy: Secondary | ICD-10-CM | POA: Diagnosis not present

## 2015-03-29 DIAGNOSIS — D509 Iron deficiency anemia, unspecified: Secondary | ICD-10-CM | POA: Insufficient documentation

## 2015-03-29 DIAGNOSIS — E785 Hyperlipidemia, unspecified: Secondary | ICD-10-CM | POA: Diagnosis not present

## 2015-03-29 DIAGNOSIS — I481 Persistent atrial fibrillation: Secondary | ICD-10-CM | POA: Insufficient documentation

## 2015-03-29 DIAGNOSIS — I951 Orthostatic hypotension: Secondary | ICD-10-CM | POA: Diagnosis not present

## 2015-03-29 LAB — BASIC METABOLIC PANEL
ANION GAP: 14 (ref 5–15)
BUN: 35 mg/dL — AB (ref 6–20)
CHLORIDE: 102 mmol/L (ref 101–111)
CO2: 25 mmol/L (ref 22–32)
Calcium: 9.5 mg/dL (ref 8.9–10.3)
Creatinine, Ser: 2.28 mg/dL — ABNORMAL HIGH (ref 0.44–1.00)
GFR calc Af Amer: 26 mL/min — ABNORMAL LOW (ref >60)
GFR, EST NON AFRICAN AMERICAN: 22 mL/min — AB (ref >60)
GLUCOSE: 88 mg/dL (ref 65–99)
POTASSIUM: 3.4 mmol/L — AB (ref 3.5–5.1)
Sodium: 141 mmol/L (ref 135–145)

## 2015-03-29 LAB — CBC
HEMATOCRIT: 33.4 % — AB (ref 36.0–46.0)
HEMOGLOBIN: 10.9 g/dL — AB (ref 12.0–15.0)
MCH: 28.8 pg (ref 26.0–34.0)
MCHC: 32.6 g/dL (ref 30.0–36.0)
MCV: 88.4 fL (ref 78.0–100.0)
Platelets: 192 10*3/uL (ref 150–400)
RBC: 3.78 MIL/uL — AB (ref 3.87–5.11)
RDW: 12.9 % (ref 11.5–15.5)
WBC: 6.3 10*3/uL (ref 4.0–10.5)

## 2015-03-29 LAB — BRAIN NATRIURETIC PEPTIDE: B Natriuretic Peptide: 75.4 pg/mL (ref 0.0–100.0)

## 2015-03-29 MED ORDER — ATORVASTATIN CALCIUM 80 MG PO TABS: 80.0000 mg | ORAL_TABLET | Freq: Every day | ORAL | Status: DC

## 2015-03-29 MED ORDER — POTASSIUM CHLORIDE CRYS ER 20 MEQ PO TBCR: 20.0000 meq | EXTENDED_RELEASE_TABLET | Freq: Every day | ORAL | Status: DC

## 2015-03-29 NOTE — Patient Instructions (Signed)
Stop Imdur  Stop Hydralazine  Stop Crestor  Start Atorvastatin 80 mg daily  Labs today  Your physician recommends that you schedule a follow-up appointment in: [place]

## 2015-03-29 NOTE — Progress Notes (Signed)
Patient ID: [name], female   DOB: 1955/03/04, 60 y.o.   MRN: [id number]    Advanced Heart Failure Clinic Note   PCP: N/A Cardiologist: Dr. Aundra Dubin  HPI: [name] is a 60 y/o female with a history of ETOH abuse, ICM, HTN, stroke and chronic systolic heart failure.  Admitted to [facility] 05/06/13 for pancreatitis. She had severely reduced systolic function with an EF of 20-25%. 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 Discharge weight was 140 pounds.   Admitted to [facility] 05/2013 with A fib RVR. Started on amiodarone 400 mg twice a day and Xarelto 20 mg daily. Chemically converted to NSR.  Admitted to [facility]  08/2013 with chest pain. Troponin was elevated. LHC was unchanged from April 2015 so she had CMRI that showed LAD territory was not viable, EF 28%. Plan to continue to treat medically.   Admitted 9/13 through 10/13/13 after attempted suicide. Overdosed on all HF medications. Discharged off carvedilol, lasix, lisinopril, xarelto and digoxin. Discharge weight 120 pounds. Discharged to [facility] which is a group home.  She has been doing much better since that time and is on sertraline.  Had colonoscopy on 03/29/14 with 8 polyps and diverticulosis. She developed increased dyspnea and had LHC/RHC in 3/16 showing mean RA 2, PA 45/21 (mean 34), PCWP mean 21, CI 2.77; LAD totally occluded with some collaterals from RCA, 40-50% OM1, 90% complex ostial PLV. No significant change on coronary angiography.  I thought that her dyspnea may have been due to worsened anemia.   In 8/16, she had St Jude ICD implanted.   Admitted to [facility] 1/24 through 02/24/2015. Treated for sepsis secondary to [facility]. On admit creatinine was  7.7. Entresto and spiro stopped. Due to anemia she was transfused 2UPRBCs. Blood loss thought to be from gastritis and duodenitis as well chronic anticoagulants. Eliquis was stopped. Discharged with nephrology  and gastrology.  Discharge weight was 134 pounds.   She returns for HF add on. Still living [facility].  At last visit, started on imdur. Has been falling, hypotensive, and with HR in 100s per home health nurse (was 109 yesterday).  Weight is down from 134 lbs to 116 lbs over the past month.  SBPs at home running 80s.  Has fallen 3 times in past 2 weeks due to dizziness. Imdur stopped yesterday by Dr Aundra Dubin. Is dizzy from sitting to standing, or lying down to sitting.  Gets better after a few minutes. Peeing well on torsemide. Says appetite has not changed over past month. She does states she has early satiety. No BRBPR or melena. Continues to smoke 3-4 cigarettes per day.  Saw renal 2 weeks ago, Creatinine remains in 5s, stable for now. Will not pursue HD unless symptoms or Cr climbs to 6-7 range. No SOB/PND/Orthopnea. No bendopnea. Can walk stairs or incline without SOB if she takes her time.  Weight at home 116. Taking all medications.   Corevue checked: This is not suggestive of volume overload.   ECG: NSR, LBBB  ECHO 09/02/13 EF 25-30%  ECHO 03/2014: EF 25-30%  ECHO 02/15/2015: EF 25-30%.   Labs 05/15/13 K 3.7 Creatinine 0.97 Labs 06/02/13 K 3.9 Creatinine 1.0 Dig level 1.3 --> cut back dig to 0.0625 mg Labs 06/08/13 K 4.7 Creatinine 1.1 Dig level 0.4  Labs 06/19/13: K 4.1, creatinine 1.35 Labs 08/05/13: K 4.5, creatinine 1.17, AST 25, ALT 28, cholesterol 188,  TG 185, HDL 41, LDL 110 Labs 08/17/13: K 4.9 Creatinine 2.0  Labs 09/06/13: Dig level 0.9 K 4.5 Creatinine 1.13 Labs 10/13/13 K 5.1 Creatinine 2.1 Labs 10/20/13 K 4.1 Creatinine 1.32 Labs 01/27/2014: Hgb 9.9, LDL 108  Labs 3/16: K 4.4, creatinine 1.08, hgb 8.6 Labs 9/16: K 3.6, creatinine 1.31, HCT 34.8, TnI 0.07 max.  Labs 10/16: K 4.5, creatinine 1.27 => 1.34, LDL 111, HDL 36 Labs 12/16: HCT 39.9 Labs 2/17: K 3.8, creatinine 5.09   SH: Lives in a group home. Unemployed. Smoker . Does not drink  alcohol   FH: Father MI at the  age of 53  ROS: All systems negative except as listed in HPI, PMH and Problem List.  Past Medical History  Diagnosis Date  . Diverticulosis   . Pancreatitis 1980, 2015    in the setting of ETOH  . chronic systolic heart failure     a. ECHO (05/08/13): EF 20-25%, diff HK with akinesis of basal inferior wall, grade 1 DD, trivial MR, trivial TR b) RHC (05/06/13): RA 7, RV 30/2, PA 31/19 (24), PCWP 10, Fick CO/CI: 2.9/1.74, Thermo CO/Ci: 2.4/1.4, PA sat 53%  . Ischemic cardiomyopathy   . CAD (coronary artery disease)     a. LHC (04/2013): Chronically occluded LAD, moderate dz in large OM1 and severe dz and small posterior lateral vessel    . Persistent atrial fibrillation ([place]) 2015  . Suicidal overdose ([place]) 10/03/13    Overdoes on Beta Blockers & Xarelto [facility] ED)  . Anxiety 2015   . Psoriasis 1968  . GERD (gastroesophageal reflux disease) 2015   . Hyperlipidemia 2015  . Hypertension 2015  . ETOH abuse 1981    started abusing alcohol at age 12, quit   . Hypothyroidism   . PONV (postoperative nausea and vomiting)   . Mass of right breast on mammogram 07/14/2014  . AICD (automatic cardioverter/defibrillator) present   . Myocardial infarction ([place])     "I've had many" (09/13/2014)  . Anginal pain ([place])   . Anemia   . History of blood transfusion     "related to losing blood from somewhere; never found out from where"  . Stroke [facility]) 2011    left side weakness, minimal  . Depression   . 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/2014    Current Outpatient Prescriptions  Medication Sig Dispense Refill  . acetaminophen (TYLENOL) 325 MG tablet Take 650 mg by mouth every 6 (six) hours as needed for mild pain.    Marland Kitchen ammonium lactate (AMLACTIN) 12 % cream Apply 1 g topically daily as needed for dry skin (apply to elbows).     . calcium carbonate (OS-CAL - DOSED IN MG OF ELEMENTAL CALCIUM) 1250 (500 Ca) MG tablet Take 1 tablet (500 mg of elemental calcium total)  by mouth 3 (three) times daily with meals. 90 tablet 3  . carvedilol (COREG) 12.5 MG tablet Take 1 tablet (12.5 mg total) by mouth 2 (two) times daily. 60 tablet 6  . ezetimibe (ZETIA) 10 MG tablet Take 1 tablet (10 mg total) by mouth daily. 90 tablet 3  . hydrALAZINE (APRESOLINE) 10 MG tablet Take 1 tablet (10 mg total) by mouth 2 (two) times daily. 60 tablet 2  . levothyroxine (SYNTHROID, LEVOTHROID) 25 MCG tablet Take 25 mcg by mouth daily before breakfast.     . LORazepam (ATIVAN) 0.5 MG tablet Take 1 tablet (0.5 mg total) by mouth 2 (two) times daily. 60 tablet 2  .  nitroGLYCERIN (NITROSTAT) 0.3 MG SL tablet Place 0.3 mg under the tongue every 5 (five) minutes as needed for chest pain (MAX 3 TABLETS). Reported on 03/08/2015    . ondansetron (ZOFRAN) 4 MG tablet Take 4 mg by mouth every 8 (eight) hours as needed for nausea or vomiting. Reported on 03/08/2015    . pantoprazole (PROTONIX) 40 MG tablet TAKE 1 TABLET BY MOUTH TWICE DAILY BEFORE A MEAL. 60 tablet 5  . rosuvastatin (CRESTOR) 40 MG tablet TAKE 1 TABLET BY MOUTH ONCE DAILY. 30 tablet 6  . sertraline (ZOLOFT) 50 MG tablet Take 100 mg by mouth daily.    Marland Kitchen torsemide (DEMADEX) 20 MG tablet Take 2 tablets (40 mg total) by mouth daily. 60 tablet 2  . zolpidem (AMBIEN) 10 MG tablet Take 1 tablet (10 mg total) by mouth at bedtime as needed for sleep. 30 tablet 0   No current facility-administered medications for this encounter.    Filed Vitals:   03/29/15 0846 03/29/15 0849  BP:  108/68  Pulse:  80  Resp:  20  Weight: 116 lb 8 oz (52.844 kg) 116 lb 8 oz (52.844 kg)  SpO2:  100%    PHYSICAL EXAM: General: NAD HEENT: normal Neck: supple. JVP 5-6.  Carotids 2+ bilaterally; no bruits. No lymphadenopathy or thryomegaly appreciated. Cor: PMI normal. Regular rate & rhythm. No rubs, gallops or murmurs. Lungs: clear Abdomen: soft, nontender, nondistended. No hepatosplenomegaly. No bruits or masses. Good bowel sounds. Extremities: no  cyanosis, clubbing, rash, edema Neuro: alert & orientedx3, cranial nerves grossly intact. Moves all 4 extremities w/o difficulty. Affect pleasant.  ASSESSMENT & PLAN: 1. Acute on chronic Systolic Heart Failure: Primarily ischemic cardiomyopathy though ETOH may play a role. EF 25-30% (08/2013).  Cardiac MRI in August 2015 showed no viability in the LAD territory with EF 28%. Most recent ECHO (01/2015) EF 25-30%. NYHA class II symptoms.  Now s/p St Jude ICD.  Volume status stable, euvolemic on exam and by Corevue.  - Off entresto and spironolactone with CKD stage V.  - Continue coreg 12.5 mg twice a day. - Stop hydralazine and Imdur with orthostasis.  - Continue current torsemide, BMET today.  - Now has LBBB, in future can consider upgrade to CRT.  2. CAD: LHC with chronically occluded LAD and severe disease in small PLV.  No change on 3/16 cath compared to prior. cMRI showed no viability in LAD territory.    - Not on aspirin or apixaban with ongoing bleeding issues.  See below, plan to start back on warfarin.   3. Current smoker: Encouraged again to stop smoking.  She is working on it.   4. Former Alcohol Abuse: Quit April 2015.  5. AF: CHADS Vasc Score 4 . Paroxysmal. NSR today. Off apixaban due to recent GI bleed. Hemoglobin higher, no further overt bleeding.  Has seen GI, recommend continuing PPI.  - Repeat CBC today.  If stable or higher, will start coumadin with coumadin clinic followup at [facility] in [place] (hold off on NOAC with very low GFR).  Will aim for INR 2-2.5.  She will continue PPI.   6. Suicide Attempt:  9/15 suicide attempt via overdose.  She is now on sertraline and doing much better.  7. Hyperlipidemia: Goal LDL < 70. She cannot take Crestor at 40 mg daily with GFR<30.  Will stop Crestor and start atorvastatin 80 mg daily.  Will need lipids/LFTs 2 months.  8. Fe deficiency anemia: Gastritis/duodenitis.  On PPI. Off eliquis due to  recent GI bleed. Saw GI recently.  As above, if  hemoglobin stable, will restart anticoagulation with warfarin goal INR 2-2.5.  9. CKD Stage V: Creatinine 5.0 since recent hospitalization with urosepsis. Followed closely by Nephrology. No plan for dialysis yet.   Satira Mccallum Tillery PA-C 03/29/2015  Patient seen with PA, agree with the above note.    No further overt bleeding.  If hemoglobin stable to higher today, will restart anticoagulation with warfarin goal INR 2-2.5 to be followed by [facility] coumadin clinic.   She is orthostatic.  Will stop hydralazine/Imdur for now.  Continue Coreg.   BMET today. Euvolemic by Corevue and exam.  Continue current torsemide for now.   Need to stop Crestor and use atorvastatin instead given very low GFR.   [name] 03/29/2015 9:56 AM

## 2015-03-30 ENCOUNTER — Telehealth (HOSPITAL_COMMUNITY): Payer: Self-pay | Admitting: *Deleted

## 2015-03-30 DIAGNOSIS — I480 Paroxysmal atrial fibrillation: Secondary | ICD-10-CM

## 2015-03-30 MED ORDER — WARFARIN SODIUM 2.5 MG PO TABS: 2.5000 mg | ORAL_TABLET | Freq: Every day | ORAL | Status: DC

## 2015-03-30 NOTE — Telephone Encounter (Signed)
Per Dr Aundra Dubin pt CBC was stable ok to start pt on coumadin for PAF goal INR 2-2.5, spoke w/Erika, pharm D she recommends pt start at 2.5 mg daily w/hx of gi bleed and have INR next week wed or thur.  Called pt's home health RN Arbie Cookey ([phone]) she is aware and states she will get INR next week and will call results to [facility], ref has been sent to them to follow pt.  Rx for coumadin has been sent in and we called pt's home and they are aware also

## 2015-04-01 LAB — CUP PACEART REMOTE DEVICE CHECK
HIGH POWER IMPEDANCE MEASURED VALUE: 89 Ohm
HighPow Impedance: 89 Ohm
Implantable Lead Implant Date: 20160823
Implantable Lead Location: 753860
Lead Channel Pacing Threshold Pulse Width: 0.5 ms
Lead Channel Setting Pacing Amplitude: 2.5 V
Lead Channel Setting Pacing Pulse Width: 0.5 ms
MDC IDC MSMT BATTERY REMAINING LONGEVITY: 102 mo
MDC IDC MSMT BATTERY REMAINING PERCENTAGE: 92 %
MDC IDC MSMT BATTERY VOLTAGE: 3.17 V
MDC IDC MSMT LEADCHNL RV IMPEDANCE VALUE: 700 Ohm
MDC IDC MSMT LEADCHNL RV PACING THRESHOLD AMPLITUDE: 0.5 V
MDC IDC MSMT LEADCHNL RV SENSING INTR AMPL: 12 mV
MDC IDC SESS DTM: 20170301070018
MDC IDC SET LEADCHNL RV SENSING SENSITIVITY: 0.5 mV
MDC IDC STAT BRADY RV PERCENT PACED: 1 %
Pulse Gen Serial Number: 7254873

## 2015-04-01 NOTE — Progress Notes (Signed)
Normal remote reviewed.  Next Merlin 06/21/15 Followed in [facility] clinic

## 2015-04-05 ENCOUNTER — Ambulatory Visit (HOSPITAL_COMMUNITY)
Admission: RE | Admit: 2015-04-05 | Discharge: 2015-04-05 | Disposition: A | Discharge status: Home / Self Care | Payer: Medicaid Other | Source: Ambulatory Visit | Attending: Internal Medicine | Admitting: Internal Medicine

## 2015-04-05 ENCOUNTER — Encounter: Payer: Self-pay | Admitting: Cardiology

## 2015-04-05 VITALS — BP 96/60 | HR 81 | Wt 117.8 lb

## 2015-04-05 DIAGNOSIS — Z7189 Other specified counseling: Secondary | ICD-10-CM

## 2015-04-05 DIAGNOSIS — Z915 Personal history of self-harm: Secondary | ICD-10-CM | POA: Diagnosis not present

## 2015-04-05 DIAGNOSIS — F17200 Nicotine dependence, unspecified, uncomplicated: Secondary | ICD-10-CM | POA: Insufficient documentation

## 2015-04-05 DIAGNOSIS — I447 Left bundle-branch block, unspecified: Secondary | ICD-10-CM | POA: Diagnosis not present

## 2015-04-05 DIAGNOSIS — N185 Chronic kidney disease, stage 5: Secondary | ICD-10-CM | POA: Insufficient documentation

## 2015-04-05 DIAGNOSIS — I5022 Chronic systolic (congestive) heart failure: Secondary | ICD-10-CM | POA: Insufficient documentation

## 2015-04-05 DIAGNOSIS — F329 Major depressive disorder, single episode, unspecified: Secondary | ICD-10-CM | POA: Insufficient documentation

## 2015-04-05 DIAGNOSIS — I69354 Hemiplegia and hemiparesis following cerebral infarction affecting left non-dominant side: Secondary | ICD-10-CM | POA: Insufficient documentation

## 2015-04-05 DIAGNOSIS — E785 Hyperlipidemia, unspecified: Secondary | ICD-10-CM | POA: Diagnosis not present

## 2015-04-05 DIAGNOSIS — I255 Ischemic cardiomyopathy: Secondary | ICD-10-CM | POA: Diagnosis not present

## 2015-04-05 DIAGNOSIS — L409 Psoriasis, unspecified: Secondary | ICD-10-CM | POA: Diagnosis not present

## 2015-04-05 DIAGNOSIS — D509 Iron deficiency anemia, unspecified: Secondary | ICD-10-CM | POA: Diagnosis not present

## 2015-04-05 DIAGNOSIS — Z8249 Family history of ischemic heart disease and other diseases of the circulatory system: Secondary | ICD-10-CM | POA: Insufficient documentation

## 2015-04-05 DIAGNOSIS — E039 Hypothyroidism, unspecified: Secondary | ICD-10-CM | POA: Insufficient documentation

## 2015-04-05 DIAGNOSIS — F419 Anxiety disorder, unspecified: Secondary | ICD-10-CM | POA: Diagnosis not present

## 2015-04-05 DIAGNOSIS — F1010 Alcohol abuse, uncomplicated: Secondary | ICD-10-CM | POA: Insufficient documentation

## 2015-04-05 DIAGNOSIS — I4891 Unspecified atrial fibrillation: Secondary | ICD-10-CM | POA: Insufficient documentation

## 2015-04-05 DIAGNOSIS — I5023 Acute on chronic systolic (congestive) heart failure: Secondary | ICD-10-CM | POA: Diagnosis not present

## 2015-04-05 DIAGNOSIS — Z7901 Long term (current) use of anticoagulants: Secondary | ICD-10-CM | POA: Insufficient documentation

## 2015-04-05 DIAGNOSIS — I2510 Atherosclerotic heart disease of native coronary artery without angina pectoris: Secondary | ICD-10-CM | POA: Insufficient documentation

## 2015-04-05 DIAGNOSIS — I132 Hypertensive heart and chronic kidney disease with heart failure and with stage 5 chronic kidney disease, or end stage renal disease: Secondary | ICD-10-CM | POA: Insufficient documentation

## 2015-04-05 DIAGNOSIS — Z95810 Presence of automatic (implantable) cardiac defibrillator: Secondary | ICD-10-CM | POA: Insufficient documentation

## 2015-04-05 DIAGNOSIS — Z79899 Other long term (current) drug therapy: Secondary | ICD-10-CM | POA: Insufficient documentation

## 2015-04-05 DIAGNOSIS — Z720 Tobacco use: Secondary | ICD-10-CM

## 2015-04-05 DIAGNOSIS — I480 Paroxysmal atrial fibrillation: Secondary | ICD-10-CM

## 2015-04-05 DIAGNOSIS — K219 Gastro-esophageal reflux disease without esophagitis: Secondary | ICD-10-CM | POA: Diagnosis not present

## 2015-04-05 MED ORDER — POTASSIUM CHLORIDE CRYS ER 20 MEQ PO TBCR: 20.0000 meq | EXTENDED_RELEASE_TABLET | Freq: Every day | ORAL | Status: DC

## 2015-04-05 MED ORDER — TORSEMIDE 20 MG PO TABS: 20.0000 mg | ORAL_TABLET | Freq: Every day | ORAL | Status: DC

## 2015-04-05 NOTE — Patient Instructions (Signed)
DECREASE Torsemide to 20 mg, one tab daily DECREASE Potassium to 20 meq, one tab daily  Your physician recommends that you schedule a follow-up appointment in: 6 weeks In the [place] following things EVERYDAY: 1) Weigh yourself in the morning before breakfast. Write it down and keep it in a log. 2) Take your medicines as prescribed 3) Eat low salt foods-Limit salt (sodium) to 2000 mg per day.  4) Stay as active as you can everyday 5) Limit all fluids for the day to less than 2 liters 6)

## 2015-04-05 NOTE — Progress Notes (Signed)
Advanced Heart Failure Medication Review by a Pharmacist  Does the patient  feel that his/her medications are working for him/her?  yes  Has the patient been experiencing any side effects to the medications prescribed?  no  Does the patient measure his/her own blood pressure or blood glucose at home?  yes   Does the patient have any problems obtaining medications due to transportation or finances?   no  Understanding of regimen: good Understanding of indications: good Potential of compliance: good Patient understands to avoid NSAIDs. Patient understands to avoid decongestants.  Issues to address at subsequent visits: None   Pharmacist comments:  Ms. Ottmar is a pleasant 60 yo F presenting without a medication list but with good understanding of her regimen. She reports good compliance with her regimen. She did state that she is taking KCl 40 meq daily instead of prescribed 20 meq daily. She also states that she never started Zetia and is not aware that this was ever prescribed for her. Based on her last lipid panel with LDL of 75, likely does not need addition of Zetia at this time.   [name]. Velva Harman, PharmD, BCPS, CPP Clinical Pharmacist Pager: [phone] Phone: [phone] 04/05/2015 9:45 AM      Time with patient: 10 minutes Preparation and documentation time: 2 minutes Total time: 12 minutes

## 2015-04-05 NOTE — Progress Notes (Signed)
Patient ID: Jutta Mckelvy, female   DOB: 12/01/1955, 60 y.o.   MRN: [id number] Patient ID: Jovina Mompremier, female   DOB: 1955-10-05, 60 y.o.   MRN: [id number]    Advanced Heart Failure Clinic Note   PCP: N/A Cardiologist: Dr. Aundra Dubin  HPI: Ms Capetillo is a 60 y/o female with a history of ETOH abuse, ICM, HTN, stroke and chronic systolic heart failure.  Admitted to [facility] 05/06/13 for pancreatitis. She had severely reduced systolic function with an EF of 20-25%. 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 Discharge weight was 140 pounds.   Admitted to [facility] 05/2013 with A fib RVR. Started on amiodarone 400 mg twice a day and Xarelto 20 mg daily. Chemically converted to NSR.  Admitted to [facility]  08/2013 with chest pain. Troponin was elevated. LHC was unchanged from April 2015 so she had CMRI that showed LAD territory was not viable, EF 28%. Plan to continue to treat medically.   Admitted 9/13 through 10/13/13 after attempted suicide. Overdosed on all HF medications. Discharged off carvedilol, lasix, lisinopril, xarelto and digoxin. Discharge weight 120 pounds. Discharged to [facility] which is a group home.  She has been doing much better since that time and is on sertraline.  Had colonoscopy on 03/29/14 with 8 polyps and diverticulosis. She developed increased dyspnea and had LHC/RHC in 3/16 showing mean RA 2, PA 45/21 (mean 34), PCWP mean 21, CI 2.77; LAD totally occluded with some collaterals from RCA, 40-50% OM1, 90% complex ostial PLV. No significant change on coronary angiography.  I thought that her dyspnea may have been due to worsened anemia.   In 8/16, she had St Jude ICD implanted.   Admitted to [facility] 1/24 through 02/24/2015. Treated for sepsis secondary to [facility]. On admit creatinine was  7.7. Entresto and spiro stopped. Due to anemia she was transfused 2UPRBCs. Blood loss thought to be from gastritis and duodenitis as  well chronic anticoagulants. Eliquis was stopped. Discharged with nephrology and gastrology.  Discharge weight was 134 pounds.   She returns for HF follow up. Last visit hydralazine and imdur stopped due to hypotension. Overall feeling much better. She has had no falls or dizziness. SBP has been upper 60s to low 90s. Weight at home 117 pounds. No BRBPR. All meds provided at [facility]. Continues to smoke 1-2 cigarettes per day.   ECG: NSR, LBBB  ECHO 09/02/13 EF 25-30%  ECHO 03/2014: EF 25-30%  ECHO 02/15/2015: EF 25-30%.   Labs 05/15/13 K 3.7 Creatinine 0.97 Labs 06/02/13 K 3.9 Creatinine 1.0 Dig level 1.3 --> cut back dig to 0.0625 mg Labs 06/08/13 K 4.7 Creatinine 1.1 Dig level 0.4  Labs 06/19/13: K 4.1, creatinine 1.35 Labs 08/05/13: K 4.5, creatinine 1.17, AST 25, ALT 28, cholesterol 188, TG 185, HDL 41, LDL 110 Labs 08/17/13: K 4.9 Creatinine 2.0  Labs 09/06/13: Dig level 0.9 K 4.5 Creatinine 1.13 Labs 10/13/13 K 5.1 Creatinine 2.1 Labs 10/20/13 K 4.1 Creatinine 1.32 Labs 01/27/2014: Hgb 9.9, LDL 108  Labs 3/16: K 4.4, creatinine 1.08, hgb 8.6 Labs 9/16: K 3.6, creatinine 1.31, HCT 34.8, TnI 0.07 max.  Labs 10/16: K 4.5, creatinine 1.27 => 1.34, LDL 111, HDL 36 Labs 12/16: HCT 39.9 Labs 2/17: K 3.8, creatinine 5.09 Labs 03/29/2015: K 3.4 Creatinine 2.28    SH: Lives in a group home. Unemployed. Smoker . Does not drink  alcohol   FH: Father  MI at the age of 16  ROS: All systems negative except as listed in HPI, PMH and Problem List.  Past Medical History  Diagnosis Date  . Diverticulosis   . Pancreatitis 1980, 2015    in the setting of ETOH  . chronic systolic heart failure     a. ECHO (05/08/13): EF 20-25%, diff HK with akinesis of basal inferior wall, grade 1 DD, trivial MR, trivial TR b) RHC (05/06/13): RA 7, RV 30/2, PA 31/19 (24), PCWP 10, Fick CO/CI: 2.9/1.74, Thermo CO/Ci: 2.4/1.4, PA sat 53%  . Ischemic cardiomyopathy   . CAD (coronary artery disease)     a. LHC  (04/2013): Chronically occluded LAD, moderate dz in large OM1 and severe dz and small posterior lateral vessel    . Persistent atrial fibrillation ([place]) 2015  . Suicidal overdose ([place]) 10/03/13    Overdoes on Beta Blockers & Xarelto [facility] ED)  . Anxiety 2015   . Psoriasis 1968  . GERD (gastroesophageal reflux disease) 2015   . Hyperlipidemia 2015  . Hypertension 2015  . ETOH abuse 1981    started abusing alcohol at age 31, quit   . Hypothyroidism   . PONV (postoperative nausea and vomiting)   . Mass of right breast on mammogram 07/14/2014  . AICD (automatic cardioverter/defibrillator) present   . Myocardial infarction ([place])     "I've had many" (09/13/2014)  . Anginal pain ([place])   . Anemia   . History of blood transfusion     "related to losing blood from somewhere; never found out from where"  . Stroke [facility]) 2011    left side weakness, minimal  . Depression   . 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/2014    Current Outpatient Prescriptions  Medication Sig Dispense Refill  . acetaminophen (TYLENOL) 325 MG tablet Take 650 mg by mouth every 6 (six) hours as needed for mild pain.    Marland Kitchen ammonium lactate (AMLACTIN) 12 % cream Apply 1 g topically daily as needed for dry skin (apply to elbows).     Marland Kitchen atorvastatin (LIPITOR) 80 MG tablet Take 1 tablet (80 mg total) by mouth daily. 30 tablet 6  . calcium carbonate (OS-CAL - DOSED IN MG OF ELEMENTAL CALCIUM) 1250 (500 Ca) MG tablet Take 1 tablet (500 mg of elemental calcium total) by mouth 3 (three) times daily with meals. 90 tablet 3  . carvedilol (COREG) 12.5 MG tablet Take 1 tablet (12.5 mg total) by mouth 2 (two) times daily. 60 tablet 6  . levothyroxine (SYNTHROID, LEVOTHROID) 25 MCG tablet Take 25 mcg by mouth daily before breakfast.     . LORazepam (ATIVAN) 0.5 MG tablet Take 1 tablet (0.5 mg total) by mouth 2 (two) times daily. 60 tablet 2  . ondansetron (ZOFRAN) 4 MG tablet Take 4 mg by mouth every 8  (eight) hours as needed for nausea or vomiting. Reported on 03/08/2015    . pantoprazole (PROTONIX) 40 MG tablet TAKE 1 TABLET BY MOUTH TWICE DAILY BEFORE A MEAL. 60 tablet 5  . potassium chloride SA (K-DUR,KLOR-CON) 20 MEQ tablet Take 40 mEq by mouth daily.    . sertraline (ZOLOFT) 50 MG tablet Take 100 mg by mouth daily.    Marland Kitchen torsemide (DEMADEX) 20 MG tablet Take 2 tablets (40 mg total) by mouth daily. 60 tablet 2  . warfarin (COUMADIN) 2.5 MG tablet Take 1 tablet (2.5 mg total) by mouth daily at 6 PM. As directed 30 tablet 3  . nitroGLYCERIN (  NITROSTAT) 0.3 MG SL tablet Place 0.3 mg under the tongue every 5 (five) minutes as needed for chest pain (MAX 3 TABLETS). Reported on 04/05/2015     No current facility-administered medications for this encounter.    Filed Vitals:   04/05/15 0924  BP: 96/60  Pulse: 81  Weight: 117 lb 12.8 oz (53.434 kg)  SpO2: 98%   Sitting 96/60 Stand 84/60   PHYSICAL EXAM: General: NAD HEENT: normal Neck: supple. JVP flat. Carotids 2+ bilaterally; no bruits. No lymphadenopathy or thryomegaly appreciated. Cor: PMI normal. Regular rate & rhythm. No rubs, gallops or murmurs. Lungs: clear Abdomen: soft, nontender, nondistended. No hepatosplenomegaly. No bruits or masses. Good bowel sounds. Extremities: no cyanosis, clubbing, rash, edema Neuro: alert & orientedx3, cranial nerves grossly intact. Moves all 4 extremities w/o difficulty. Affect pleasant.  ASSESSMENT & PLAN: 1. Chronic Systolic Heart Failure: Primarily ischemic cardiomyopathy though ETOH may play a role. EF 25-30% (08/2013).  Cardiac MRI in August 2015 showed no viability in the LAD territory with EF 28%. Most recent ECHO (01/2015) EF 25-30%.   Now s/p St Jude ICD.   NYHA II. Volume status a little low.  She is orthostatic. Cut back torsemide to 20 mg daily and cut back potassium to 20 meq daily - Off entresto and spironolactone with CKD stage V.  - Continue coreg 12.5 mg twice a day. - Off   hydralazine and Imdur with orthostasis.  - Now has LBBB, in future can consider upgrade to CRT.  2. CAD: LHC with chronically occluded LAD and severe disease in small PLV.  No change on 3/16 cath compared to prior. cMRI showed no viability in LAD territory.    - Not on aspirin or apixaban with ongoing bleeding issues.  See below, plan to start back on warfarin.   3. Current smoker: Encouraged again to stop smoking.   4. Former Alcohol Abuse: Quit April 2015.  5. AF: CHADS Vasc Score 4 . Paroxysmal. NSR today. Off apixaban due to recent GI bleed.   Has seen GI, recommend continuing PPI. Last visit started on coumadin. No bleeding problems. -[place] (hold off on NOAC with very low GFR).  Will aim for INR 2-2.5.     6. Suicide Attempt:  9/15 suicide attempt via overdose.  She is now on sertraline and doing much better. Resolved.  7. Hyperlipidemia: Goal LDL < 70. Off Crestor with GFR<30.  Continue atorvastatin 80 mg daily.  Will need lipids/LFTs 1 month.  8. Fe deficiency anemia: Gastritis/duodenitis.  On PPI. Off eliquis due to recent GI bleed. Tolerating coumadin.    9. CKD Stage V: Creatinine down from 5 to 2.28  Followed closely by Nephrology. No plan for dialysis yet  Follow up 6 weeks.    Amy Clegg NP-C  04/05/2015

## 2015-04-06 ENCOUNTER — Telehealth (HOSPITAL_COMMUNITY): Payer: Self-pay | Admitting: *Deleted

## 2015-04-06 ENCOUNTER — Telehealth: Payer: Self-pay | Admitting: *Deleted

## 2015-04-06 ENCOUNTER — Ambulatory Visit (INDEPENDENT_AMBULATORY_CARE_PROVIDER_SITE_OTHER): Payer: Medicaid Other | Admitting: Pharmacist

## 2015-04-06 DIAGNOSIS — I480 Paroxysmal atrial fibrillation: Secondary | ICD-10-CM

## 2015-04-06 LAB — POCT INR: INR: 1.3

## 2015-04-06 MED ORDER — WARFARIN SODIUM 2.5 MG PO TABS: 3.7500 mg | ORAL_TABLET | Freq: Every day | ORAL | Status: DC

## 2015-04-06 NOTE — Telephone Encounter (Signed)
Sent to [facility], in [place]

## 2015-04-06 NOTE — Telephone Encounter (Signed)
See anticoag note for details.  INR checked on 3/16.

## 2015-04-06 NOTE — Telephone Encounter (Signed)
Lelan Pons needs orders for patient's INR

## 2015-04-06 NOTE — Telephone Encounter (Signed)
Spoke with Verdis Frederickson with [facility].  INR addressed.  See anti-coag clinic note for details.

## 2015-04-06 NOTE — Telephone Encounter (Signed)
[name] with [facility] called to give patients INR it is 1.3. Her callback is [phone]

## 2015-04-10 ENCOUNTER — Other Ambulatory Visit (HOSPITAL_COMMUNITY): Payer: Self-pay | Admitting: *Deleted

## 2015-04-10 ENCOUNTER — Encounter: Payer: Self-pay | Admitting: Internal Medicine

## 2015-04-10 DIAGNOSIS — I5023 Acute on chronic systolic (congestive) heart failure: Secondary | ICD-10-CM

## 2015-04-10 MED ORDER — TORSEMIDE 20 MG PO TABS: 20.0000 mg | ORAL_TABLET | Freq: Every day | ORAL | Status: DC

## 2015-04-18 ENCOUNTER — Encounter (HOSPITAL_COMMUNITY): Payer: Medicaid Other | Attending: Family Medicine

## 2015-04-25 ENCOUNTER — Encounter (HOSPITAL_COMMUNITY): Payer: Self-pay

## 2015-05-08 ENCOUNTER — Ambulatory Visit (INDEPENDENT_AMBULATORY_CARE_PROVIDER_SITE_OTHER): Payer: Medicaid Other

## 2015-05-08 ENCOUNTER — Telehealth: Payer: Self-pay

## 2015-05-08 DIAGNOSIS — Z95810 Presence of automatic (implantable) cardiac defibrillator: Secondary | ICD-10-CM | POA: Diagnosis not present

## 2015-05-08 DIAGNOSIS — I5023 Acute on chronic systolic (congestive) heart failure: Secondary | ICD-10-CM | POA: Diagnosis not present

## 2015-05-08 NOTE — Telephone Encounter (Signed)
Remote ICM transmission received.  Attempted patient call and left message for return call.   

## 2015-05-08 NOTE — Progress Notes (Signed)
EPIC Encounter for ICM Monitoring  Patient Name: [name] is a 60 y.o. female Date: 05/08/2015 Primary Care Physican: [name], MD Primary Cardiologist: Aundra Dubin Electrophysiologist: Allred Dry Weight: unknown   In the past month, have you:  1. Gained more than 2 pounds in a day or more than 5 pounds in a week? N/A  2. Had changes in your medications (with verification of current medications)? N/A  3. Had more shortness of breath than is usual for you? N/A  4. Limited your activity because of shortness of breath? N/A  5. Not been able to sleep because of shortness of breath? N/A  6. Had increased swelling in your feet or ankles? N/A  7. Had symptoms of dehydration (dizziness, dry mouth, increased thirst, decreased urine output) N/A  8. Had changes in sodium restriction? N/A  9. Been compliant with medication? N/A   ICM trend: 3 month view for 05/08/2015   ICM trend: 1 year view for 05/08/2015   Follow-up plan: ICM clinic phone appointment on 06/21/2015.  Attempted call to patient and unable to reach.  Transmission reviewed.  Thoracic impedance below reference line from 03/31/2015 to 04/03/2015 and 04/22/2015 to 04/30/2015 suggesting fluid accumulation.  Thoracic impedance returned to reference line 05/01/2015 suggesting fluid levels stabilized.      Rosalene Billings, RN, CCM 05/08/2015 12:11 PM

## 2015-05-10 ENCOUNTER — Ambulatory Visit (HOSPITAL_COMMUNITY): Payer: Self-pay | Admitting: Oncology

## 2015-05-17 ENCOUNTER — Ambulatory Visit (HOSPITAL_COMMUNITY)
Admission: RE | Admit: 2015-05-17 | Discharge: 2015-05-17 | Disposition: A | Discharge status: Home / Self Care | Payer: Medicaid Other | Source: Ambulatory Visit | Attending: Internal Medicine | Admitting: Internal Medicine

## 2015-05-17 ENCOUNTER — Encounter: Payer: Self-pay | Admitting: Licensed Clinical Social Worker

## 2015-05-17 VITALS — BP 110/60 | HR 65 | Wt 120.0 lb

## 2015-05-17 DIAGNOSIS — L409 Psoriasis, unspecified: Secondary | ICD-10-CM | POA: Insufficient documentation

## 2015-05-17 DIAGNOSIS — I5022 Chronic systolic (congestive) heart failure: Secondary | ICD-10-CM | POA: Diagnosis present

## 2015-05-17 DIAGNOSIS — I255 Ischemic cardiomyopathy: Secondary | ICD-10-CM | POA: Diagnosis not present

## 2015-05-17 DIAGNOSIS — Z95810 Presence of automatic (implantable) cardiac defibrillator: Secondary | ICD-10-CM | POA: Diagnosis not present

## 2015-05-17 DIAGNOSIS — I132 Hypertensive heart and chronic kidney disease with heart failure and with stage 5 chronic kidney disease, or end stage renal disease: Secondary | ICD-10-CM | POA: Insufficient documentation

## 2015-05-17 DIAGNOSIS — I480 Paroxysmal atrial fibrillation: Secondary | ICD-10-CM | POA: Insufficient documentation

## 2015-05-17 DIAGNOSIS — I2510 Atherosclerotic heart disease of native coronary artery without angina pectoris: Secondary | ICD-10-CM | POA: Diagnosis not present

## 2015-05-17 DIAGNOSIS — Z79899 Other long term (current) drug therapy: Secondary | ICD-10-CM | POA: Diagnosis not present

## 2015-05-17 DIAGNOSIS — K219 Gastro-esophageal reflux disease without esophagitis: Secondary | ICD-10-CM | POA: Diagnosis not present

## 2015-05-17 DIAGNOSIS — Z7901 Long term (current) use of anticoagulants: Secondary | ICD-10-CM | POA: Insufficient documentation

## 2015-05-17 DIAGNOSIS — F1011 Alcohol abuse, in remission: Secondary | ICD-10-CM

## 2015-05-17 DIAGNOSIS — E039 Hypothyroidism, unspecified: Secondary | ICD-10-CM | POA: Insufficient documentation

## 2015-05-17 DIAGNOSIS — I252 Old myocardial infarction: Secondary | ICD-10-CM | POA: Diagnosis not present

## 2015-05-17 DIAGNOSIS — Z8249 Family history of ischemic heart disease and other diseases of the circulatory system: Secondary | ICD-10-CM | POA: Diagnosis not present

## 2015-05-17 DIAGNOSIS — F17200 Nicotine dependence, unspecified, uncomplicated: Secondary | ICD-10-CM | POA: Insufficient documentation

## 2015-05-17 DIAGNOSIS — N185 Chronic kidney disease, stage 5: Secondary | ICD-10-CM | POA: Diagnosis not present

## 2015-05-17 DIAGNOSIS — D509 Iron deficiency anemia, unspecified: Secondary | ICD-10-CM | POA: Insufficient documentation

## 2015-05-17 DIAGNOSIS — Z720 Tobacco use: Secondary | ICD-10-CM

## 2015-05-17 DIAGNOSIS — F1010 Alcohol abuse, uncomplicated: Secondary | ICD-10-CM

## 2015-05-17 DIAGNOSIS — F419 Anxiety disorder, unspecified: Secondary | ICD-10-CM | POA: Insufficient documentation

## 2015-05-17 DIAGNOSIS — Z8673 Personal history of transient ischemic attack (TIA), and cerebral infarction without residual deficits: Secondary | ICD-10-CM | POA: Diagnosis not present

## 2015-05-17 DIAGNOSIS — E785 Hyperlipidemia, unspecified: Secondary | ICD-10-CM | POA: Diagnosis not present

## 2015-05-17 DIAGNOSIS — F329 Major depressive disorder, single episode, unspecified: Secondary | ICD-10-CM | POA: Diagnosis not present

## 2015-05-17 NOTE — Progress Notes (Signed)
CSW met with patient in the clinic. Patient was in great spirits and looked fantastic. She was well groomed and had make up and stated she was feeling great! Patient reports that [facility] closed and she was moved to another home. Patient states that she does not feel she needs to remain in a group home at this time as she has improved her health and feeling well. Patient wondering her options for independent living. CSW discussed due to limited income her options are slim. CSW will explore resources and return call to patient later in the week. Patient verbalized understanding and will look into options as well. CSW continues to follow for supportive needs and housing options. Raquel Sarna, LCSW [phone]

## 2015-05-17 NOTE — Progress Notes (Signed)
Patient ID: [name], female   DOB: 1955-11-22, 60 y.o.   MRN: [id number]    Advanced Heart Failure Clinic Note   PCP: N/A Cardiologist: Dr. Aundra Dubin EP: Dr Caryl Comes   HPI: [name] is a 60 y/o female with a history of ETOH abuse, ICM, HTN, stroke and chronic systolic heart failure.  Admitted to [facility] 05/06/13 for pancreatitis. She had severely reduced systolic function with an EF of 20-25%. 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 Discharge weight was 140 pounds.   Admitted to [facility] 05/2013 with A fib RVR. Started on amiodarone 400 mg twice a day and Xarelto 20 mg daily. Chemically converted to NSR.  Admitted to [facility]  08/2013 with chest pain. Troponin was elevated. LHC was unchanged from April 2015 so she had CMRI that showed LAD territory was not viable, EF 28%. Plan to continue to treat medically.   Admitted 9/13 through 10/13/13 after attempted suicide. Overdosed on all HF medications. Discharged off carvedilol, lasix, lisinopril, xarelto and digoxin. Discharge weight 120 pounds. Discharged to [facility] which is a group home.  She has been doing much better since that time and is on sertraline.  Had colonoscopy on 03/29/14 with 8 polyps and diverticulosis. She developed increased dyspnea and had LHC/RHC in 3/16 showing mean RA 2, PA 45/21 (mean 34), PCWP mean 21, CI 2.77; LAD totally occluded with some collaterals from RCA, 40-50% OM1, 90% complex ostial PLV. No significant change on coronary angiography.  I thought that her dyspnea may have been due to worsened anemia.   In 8/16, she had St Jude ICD implanted.   Admitted to [facility] 1/24 through 02/24/2015. Treated for sepsis secondary to [facility]. On admit creatinine was  7.7. Entresto and spiro stopped. Due to anemia she was transfused 2UPRBCs. Blood loss thought to be from gastritis and duodenitis as well chronic anticoagulants. Eliquis was stopped. Discharged  with nephrology and gastrology.  Discharge weight was 134 pounds.   She returns for HF follow up. Overall feeling good. Denies SOB/PND/Orthopnea. Taking all medications.  Weight at home 120 pounds. No BRBPR. Recenlty relocated from [facility] to [facility] All meds provided at [facility]. Continues to smoke 1-2 cigarettes per day.   ECG: NSR, LBBB  ECHO 09/02/13 EF 25-30%  ECHO 03/2014: EF 25-30%  ECHO 02/15/2015: EF 25-30%.   Labs 05/15/13 K 3.7 Creatinine 0.97 Labs 06/02/13 K 3.9 Creatinine 1.0 Dig level 1.3 --> cut back dig to 0.0625 mg Labs 06/08/13 K 4.7 Creatinine 1.1 Dig level 0.4  Labs 06/19/13: K 4.1, creatinine 1.35 Labs 08/05/13: K 4.5, creatinine 1.17, AST 25, ALT 28, cholesterol 188, TG 185, HDL 41, LDL 110 Labs 08/17/13: K 4.9 Creatinine 2.0  Labs 09/06/13: Dig level 0.9 K 4.5 Creatinine 1.13 Labs 10/13/13 K 5.1 Creatinine 2.1 Labs 10/20/13 K 4.1 Creatinine 1.32 Labs 01/27/2014: Hgb 9.9, LDL 108  Labs 3/16: K 4.4, creatinine 1.08, hgb 8.6 Labs 9/16: K 3.6, creatinine 1.31, HCT 34.8, TnI 0.07 max.  Labs 10/16: K 4.5, creatinine 1.27 => 1.34, LDL 111, HDL 36 Labs 12/16: HCT 39.9 Labs 2/17: K 3.8, creatinine 5.09 Labs 03/29/2015: K 3.4 Creatinine 2.28    SH: Lives in a group home. Unemployed. Smoker . Does not drink  alcohol   FH: Father MI at the age of 29  ROS: All systems negative except as listed in HPI, PMH and Problem List.  Past Medical History  Diagnosis Date  . Diverticulosis   . Pancreatitis 1980, 2015    in the setting of ETOH  . chronic systolic heart failure     a. ECHO (05/08/13): EF 20-25%, diff HK with akinesis of basal inferior wall, grade 1 DD, trivial MR, trivial TR b) RHC (05/06/13): RA 7, RV 30/2, PA 31/19 (24), PCWP 10, Fick CO/CI: 2.9/1.74, Thermo CO/Ci: 2.4/1.4, PA sat 53%  . Ischemic cardiomyopathy   . CAD (coronary artery disease)     a. LHC (04/2013): Chronically occluded LAD, moderate dz in large OM1 and severe dz and small posterior lateral vessel    .  Persistent atrial fibrillation ([place]) 2015  . Suicidal overdose ([place]) 10/03/13    Overdoes on Beta Blockers & Xarelto [facility] ED)  . Anxiety 2015   . Psoriasis 1968  . GERD (gastroesophageal reflux disease) 2015   . Hyperlipidemia 2015  . Hypertension 2015  . ETOH abuse 1981    started abusing alcohol at age 50, quit   . Hypothyroidism   . PONV (postoperative nausea and vomiting)   . Mass of right breast on mammogram 07/14/2014  . AICD (automatic cardioverter/defibrillator) present   . Myocardial infarction ([place])     "I've had many" (09/13/2014)  . Anginal pain ([place])   . Anemia   . History of blood transfusion     "related to losing blood from somewhere; never found out from where"  . Stroke [facility]) 2011    left side weakness, minimal  . Depression   . 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/2014    Current Outpatient Prescriptions  Medication Sig Dispense Refill  . acetaminophen (TYLENOL) 325 MG tablet Take 650 mg by mouth every 6 (six) hours as needed for mild pain.    Marland Kitchen ammonium lactate (AMLACTIN) 12 % cream Apply 1 g topically daily as needed for dry skin (apply to elbows).     Marland Kitchen atorvastatin (LIPITOR) 80 MG tablet Take 1 tablet (80 mg total) by mouth daily. 30 tablet 6  . calcium carbonate (OS-CAL - DOSED IN MG OF ELEMENTAL CALCIUM) 1250 (500 Ca) MG tablet Take 1 tablet (500 mg of elemental calcium total) by mouth 3 (three) times daily with meals. 90 tablet 3  . carvedilol (COREG) 12.5 MG tablet Take 1 tablet (12.5 mg total) by mouth 2 (two) times daily. 60 tablet 6  . levothyroxine (SYNTHROID, LEVOTHROID) 25 MCG tablet Take 25 mcg by mouth daily before breakfast.     . LORazepam (ATIVAN) 0.5 MG tablet Take 1 tablet (0.5 mg total) by mouth 2 (two) times daily. 60 tablet 2  . nitroGLYCERIN (NITROSTAT) 0.3 MG SL tablet Place 0.3 mg under the tongue every 5 (five) minutes as needed for chest pain (MAX 3 TABLETS). Reported on 04/05/2015    . ondansetron  (ZOFRAN) 4 MG tablet Take 4 mg by mouth every 8 (eight) hours as needed for nausea or vomiting. Reported on 03/08/2015    . pantoprazole (PROTONIX) 40 MG tablet TAKE 1 TABLET BY MOUTH TWICE DAILY BEFORE A MEAL. 60 tablet 5  . potassium chloride SA (K-DUR,KLOR-CON) 20 MEQ tablet Take 1 tablet (20 mEq total) by mouth daily. 30 tablet 6  . sertraline (ZOLOFT) 50 MG tablet Take 100 mg by mouth daily.    Marland Kitchen torsemide (DEMADEX) 20 MG tablet Take 1 tablet (20 mg total) by mouth daily. 30 tablet 2  . warfarin (COUMADIN) 2.5 MG tablet Take 1.5 tablets (3.75 mg total) by mouth daily. As  directed 45 tablet 0   No current facility-administered medications for this encounter.    Filed Vitals:   05/17/15 0945  BP: 110/60  Pulse: 65  Weight: 120 lb (54.432 kg)  SpO2: 100%   PHYSICAL EXAM: General: NAD HEENT: normal Neck: supple. JVP flat. Carotids 2+ bilaterally; no bruits. No lymphadenopathy or thryomegaly appreciated. Cor: PMI normal. Regular rate & rhythm. No rubs, gallops or murmurs. Lungs: clear Abdomen: soft, nontender, nondistended. No hepatosplenomegaly. No bruits or masses. Good bowel sounds. Extremities: no cyanosis, clubbing, rash, edema Neuro: alert & orientedx3, cranial nerves grossly intact. Moves all 4 extremities w/o difficulty. Affect pleasant.  ASSESSMENT & PLAN: 1. Chronic Systolic Heart Failure: Primarily ischemic cardiomyopathy though ETOH may play a role. EF 25-30% (08/2013).  Cardiac MRI in August 2015 showed no viability in the LAD territory with EF 28%. Most recent ECHO (01/2015) EF 25-30%.   Now s/p St Jude ICD.   NYHA II. Volume status stable. Continue  torsemide to 20 mg daily and  potassium to 20 meq daily - Off entresto and spironolactone with CKD stage V.  - Continue coreg 12.5 mg twice a day. - Off  hydralazine and Imdur with recent hypotension.   - Now has LBBB, in future can consider upgrade to CRT.  2. CAD: LHC with chronically occluded LAD and severe disease in  small PLV.  No change on 3/16 cath compared to prior. cMRI showed no viability in LAD territory.    - Not on aspirin or apixaban with ongoing bleeding issues.   She is on coumadin. Has not had INR checked in a few weeks. I am checking with [facility] about INR.    3. Current smoker: Encouraged again to stop smoking.   4. Former Alcohol Abuse: Quit April 2015.  5. AF: CHADS Vasc Score 4 . Paroxysmal. NSR today. Off apixaban due to recent GI bleed.   Has seen GI, recommend continuing PPI. On coumadin no bleeding problems. . Will aim for INR 2-2.5.     6. Suicide Attempt:  9/15 suicide attempt via overdose.  She is now on sertraline and doing much better. Resolved.  7. Hyperlipidemia: Goal LDL < 70. Off Crestor with GFR<30.  Continue atorvastatin 80 mg daily.  Check lipids next visit.  8. Fe deficiency anemia: Gastritis/duodenitis.  On PPI. Off eliquis due to recent GI bleed. Tolerating coumadin.    9. CKD Stage V: Creatinine down from 5 to 2.28  Followed closely by Nephrology. No plan for dialysis yet. Instructed to not take ibuprofen.   Follow up 8 weeks. Referred to HF SW for assistance with housing.   Rozalynn Buege NP-C  05/17/2015

## 2015-05-17 NOTE — Patient Instructions (Signed)
Your physician recommends that you schedule a follow-up appointment in: 8 weeks  Do the following things EVERYDAY: 1) Weigh yourself in the morning before breakfast. Write it down and keep it in a log. 2) Take your medicines as prescribed 3) Eat low salt foods-Limit salt (sodium) to 2000 mg per day.  4) Stay as active as you can everyday 5) Limit all fluids for the day to less than 2 liters 6)

## 2015-05-22 ENCOUNTER — Ambulatory Visit (INDEPENDENT_AMBULATORY_CARE_PROVIDER_SITE_OTHER): Payer: Medicaid Other | Admitting: Pharmacist

## 2015-05-22 ENCOUNTER — Telehealth: Payer: Self-pay | Admitting: Licensed Clinical Social Worker

## 2015-05-22 DIAGNOSIS — I480 Paroxysmal atrial fibrillation: Secondary | ICD-10-CM | POA: Diagnosis not present

## 2015-05-22 LAB — POCT INR: INR: 1.7

## 2015-05-22 MED ORDER — WARFARIN SODIUM 2.5 MG PO TABS: ORAL_TABLET | ORAL | Status: DC

## 2015-05-23 ENCOUNTER — Telehealth: Payer: Self-pay | Admitting: Licensed Clinical Social Worker

## 2015-05-23 NOTE — Telephone Encounter (Signed)
CSW spoke with patient about various housing options. Patient has a sister and niece in [place]. Patient interested in possiblity of sharing an apartment with her sister but has not discussed with her. Patient currently residing in a group home and states she is ready for independent living. CSW discussed moving from supportive environment to independent living and concerns with that move. Patient has been doing well and feeling good with minimal medical issues living in supportive environment. Patient will discuss with family and consider her options and return call to [place]. CSW will continue to be available and support patient with housing questions/options. Raquel Sarna, LCSW [phone]

## 2015-05-23 NOTE — Telephone Encounter (Signed)
CSW left message for patient to return call to discuss further options for housing. Raquel Sarna, LCSW [phone]

## 2015-06-01 ENCOUNTER — Ambulatory Visit (INDEPENDENT_AMBULATORY_CARE_PROVIDER_SITE_OTHER): Payer: Medicaid Other | Admitting: *Deleted

## 2015-06-01 DIAGNOSIS — I480 Paroxysmal atrial fibrillation: Secondary | ICD-10-CM | POA: Diagnosis not present

## 2015-06-01 LAB — POCT INR: INR: 3.3

## 2015-06-01 MED ORDER — WARFARIN SODIUM 2.5 MG PO TABS: ORAL_TABLET | ORAL | Status: DC

## 2015-06-05 ENCOUNTER — Encounter (HOSPITAL_COMMUNITY): Payer: Self-pay | Admitting: Oncology

## 2015-06-05 ENCOUNTER — Encounter (HOSPITAL_COMMUNITY): Payer: Medicaid Other | Attending: Oncology | Admitting: Oncology

## 2015-06-05 ENCOUNTER — Encounter (HOSPITAL_COMMUNITY): Payer: Medicaid Other

## 2015-06-05 VITALS — BP 102/68 | HR 65 | Temp 98.3°F | Resp 18 | Wt 121.0 lb

## 2015-06-05 DIAGNOSIS — Z7189 Other specified counseling: Secondary | ICD-10-CM

## 2015-06-05 DIAGNOSIS — I5022 Chronic systolic (congestive) heart failure: Secondary | ICD-10-CM | POA: Insufficient documentation

## 2015-06-05 DIAGNOSIS — Z809 Family history of malignant neoplasm, unspecified: Secondary | ICD-10-CM | POA: Diagnosis not present

## 2015-06-05 DIAGNOSIS — Z8673 Personal history of transient ischemic attack (TIA), and cerebral infarction without residual deficits: Secondary | ICD-10-CM | POA: Diagnosis not present

## 2015-06-05 DIAGNOSIS — Z811 Family history of alcohol abuse and dependence: Secondary | ICD-10-CM | POA: Diagnosis not present

## 2015-06-05 DIAGNOSIS — D509 Iron deficiency anemia, unspecified: Secondary | ICD-10-CM

## 2015-06-05 DIAGNOSIS — Z66 Do not resuscitate: Secondary | ICD-10-CM

## 2015-06-05 DIAGNOSIS — K219 Gastro-esophageal reflux disease without esophagitis: Secondary | ICD-10-CM | POA: Diagnosis not present

## 2015-06-05 DIAGNOSIS — K5790 Diverticulosis of intestine, part unspecified, without perforation or abscess without bleeding: Secondary | ICD-10-CM | POA: Diagnosis not present

## 2015-06-05 DIAGNOSIS — Z833 Family history of diabetes mellitus: Secondary | ICD-10-CM | POA: Diagnosis not present

## 2015-06-05 DIAGNOSIS — F17210 Nicotine dependence, cigarettes, uncomplicated: Secondary | ICD-10-CM | POA: Insufficient documentation

## 2015-06-05 DIAGNOSIS — Z9889 Other specified postprocedural states: Secondary | ICD-10-CM | POA: Diagnosis not present

## 2015-06-05 HISTORY — DX: Do not resuscitate: Z66

## 2015-06-05 LAB — CBC WITH DIFFERENTIAL/PLATELET
Basophils Absolute: 0.1 10*3/uL (ref 0.0–0.1)
Basophils Relative: 2 %
EOS ABS: 0.1 10*3/uL (ref 0.0–0.7)
EOS PCT: 2 %
HCT: 30.3 % — ABNORMAL LOW (ref 36.0–46.0)
Hemoglobin: 10 g/dL — ABNORMAL LOW (ref 12.0–15.0)
LYMPHS ABS: 1.3 10*3/uL (ref 0.7–4.0)
LYMPHS PCT: 28 %
MCH: 30.6 pg (ref 26.0–34.0)
MCHC: 33 g/dL (ref 30.0–36.0)
MCV: 92.7 fL (ref 78.0–100.0)
MONOS PCT: 10 %
Monocytes Absolute: 0.4 10*3/uL (ref 0.1–1.0)
Neutro Abs: 2.6 10*3/uL (ref 1.7–7.7)
Neutrophils Relative %: 58 %
PLATELETS: 201 10*3/uL (ref 150–400)
RBC: 3.27 MIL/uL — AB (ref 3.87–5.11)
RDW: 13.5 % (ref 11.5–15.5)
WBC: 4.5 10*3/uL (ref 4.0–10.5)

## 2015-06-05 LAB — IRON AND TIBC
IRON: 65 ug/dL (ref 28–170)
Saturation Ratios: 20 % (ref 10.4–31.8)
TIBC: 321 ug/dL (ref 250–450)
UIBC: 256 ug/dL

## 2015-06-05 LAB — FERRITIN: FERRITIN: 87 ng/mL (ref 11–307)

## 2015-06-05 NOTE — Assessment & Plan Note (Signed)
Iron deficiency anemia in the setting of chronic anticoagulation with Eliquis.  Negative GI work-up.  Oncology Flowsheet 12/01/2014 01/10/2015  ferric gluconate (NULECIT) IV 125 mg 125 mg    Labs today: CBC diff, iron/TIBC, ferritin  Labs in 6 weeks, 12 weeks, and 18 weeks: CBC diff, iron/TIBC, ferritin  Return in 18 weeks for follow-up.

## 2015-06-05 NOTE — Patient Instructions (Signed)
[place] at [facility] Discharge Instructions  RECOMMENDATIONS MADE BY THE CONSULTANT AND ANY TEST RESULTS WILL BE SENT TO YOUR REFERRING PHYSICIAN.  Exam and discussion today with Kirby Crigler, PA. Lab work today and every 6 weeks.  Office visit with Dr. Whitney Muse as scheduled in September. Call the clinic should you have any questions or concerns prior to your next visit.   Thank you for choosing [place] at [facility] to provide your oncology and hematology care.  To afford each patient quality time with our provider, please arrive at least 15 minutes before your scheduled appointment time.   Beginning January 23rd 2017 lab work for the [facility] will be done in the  Main lab at [facility] on 1st floor. If you have a lab appointment with the [place] please come in thru the  Main Entrance and check in at the main information desk  You need to re-schedule your appointment should you arrive 10 or more minutes late.  We strive to give you quality time with our providers, and arriving late affects you and other patients whose appointments are after yours.  Also, if you no show three or more times for appointments you may be dismissed from the clinic at the providers discretion.     Again, thank you for choosing [facility].  Our hope is that these requests will decrease the amount of time that you wait before being seen by our physicians.       _____________________________________________________________  Should you have questions after your visit to [facility], please contact our office at [phone] between the hours of 8:30 a.m. and 4:30 p.m.  Voicemails left after 4:30 p.m. will not be returned until the following business day.  For prescription refill requests, have your pharmacy contact our office.         Resources For Cancer Patients and their Caregivers ? American Cancer Society: Can assist  with transportation, wigs, general needs, runs Look Good Feel Better.        317-728-9897 ? Cancer Care: Provides financial assistance, online support groups, medication/co-pay assistance.  1-800-813-HOPE (503) 233-4943) ? [employer] Assists [place] cancer patients and their families through emotional , educational and financial support.  (218) 657-1812 ? [place] DSS Where to apply for food stamps, Medicaid and utility assistance. 934-728-9904 ? [facility]: Transportation to medical appointments. 541-168-3796 ? Social Security Administration: May apply for disability if have a Stage IV cancer. (214) 400-3306 316-222-2067 ? [employer], Disability and Transit Services: Assists with nutrition, care and transit needs. [place] Support Programs: @10RELATIVEDAYS @ > Cancer Support Group  2nd Tuesday of the month 1pm-2pm, Journey Room  > Creative Journey  3rd Tuesday of the month 1130am-1pm, Journey Room  > Look Good Feel Better  1st Wednesday of the month 10am-12 noon, Journey Room (Call [place] to register [phone])

## 2015-06-05 NOTE — Progress Notes (Signed)
[name] Ends, MD [place] [address]  Iron deficiency anemia - Plan: CBC with Differential, Iron and TIBC, Ferritin, CBC with Differential, Iron and TIBC, Ferritin  DNR (do not resuscitate) discussion - Plan: DNR (Do Not Resuscitate)  DNR (do not resuscitate)  CURRENT THERAPY: IV iron replacement when indicated.  INTERVAL HISTORY: [name] 60 y.o. female returns for followup of iron deficiency anemia with Colonscopy on 03/29/2014 being negative and EGD on 11/22/2013 demonstrating duodenitis and gastritis on chronic anticoagulation, now on Vit K antagonist. She also has a history of low B12.   I personally reviewed and went over laboratory results with the patient.  The results are noted within this dictation.  Labs will be updated today.  Chart reviewed.  Hospitalization from 1/28- 02/24/2015 is noted and records are reviewed: 1. Sepsis secondary to Escherichia coli urinary tract infection. 2. Acute on chronic systolic congestive heart failure with a history of ischemic cardiomyopathy. Ejection fraction 25-30% per echo. 3. Status post ICD, St. 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 4. Chronic paroxysmal atrial fibrillation. Eliquis discontinued secondary to heme positive stool and anemia.  She is noted to be a DNR with her last hospitalization.  She reports that she does have the yellow DNR form where she is staying.  It is completed.  She confirms continued wish to be a DNR.  Problem list is updated.  Outpatient DNR order is placed.  Review of Systems  Constitutional: Positive for malaise/fatigue. Negative for fever and chills.  HENT: Negative.   Eyes: Negative.   Respiratory: Negative.  Negative for hemoptysis.   Cardiovascular: Negative.  Negative for chest pain and leg swelling.  Gastrointestinal: Negative for nausea, vomiting, diarrhea, constipation, blood in stool and melena.  Genitourinary: Negative for hematuria.  Musculoskeletal: Negative.   Skin:  Negative.   Neurological: Negative.   Endo/Heme/Allergies: Negative.   Psychiatric/Behavioral: Negative.     Past Medical History  Diagnosis Date  . Diverticulosis   . Pancreatitis 1980, 2015    in the setting of ETOH  . chronic systolic heart failure     a. ECHO (05/08/13): EF 20-25%, diff HK with akinesis of basal inferior wall, grade 1 DD, trivial MR, trivial TR b) RHC (05/06/13): RA 7, RV 30/2, PA 31/19 (24), PCWP 10, Fick CO/CI: 2.9/1.74, Thermo CO/Ci: 2.4/1.4, PA sat 53%  . Ischemic cardiomyopathy   . CAD (coronary artery disease)     a. LHC (04/2013): Chronically occluded LAD, moderate dz in large OM1 and severe dz and small posterior lateral vessel    . Persistent atrial fibrillation ([place]) 2015  . Suicidal overdose ([place]) 10/03/13    Overdoes on Beta Blockers & Xarelto [facility] ED)  . Anxiety 2015   . Psoriasis 1968  . GERD (gastroesophageal reflux disease) 2015   . Hyperlipidemia 2015  . Hypertension 2015  . ETOH abuse 1981    started abusing alcohol at age 27, quit   . Hypothyroidism   . PONV (postoperative nausea and vomiting)   . Mass of right breast on mammogram 07/14/2014  . AICD (automatic cardioverter/defibrillator) present   . Myocardial infarction ([place])     "I've had many" (09/13/2014)  . Anginal pain ([place])   . Anemia   . History of blood transfusion     "related to losing blood from somewhere; never found out from where"  . Stroke [facility]) 2011    left side weakness, minimal  . Depression   . S/P ICD (internal  cardiac defibrillator) procedure, St. Jude device 09/14/2014  . DNR (do not resuscitate) 06/05/2015    Confirmed with patient on 06/05/2015    Past Surgical History  Procedure Laterality Date  . Colostomy takedown  2007  . Abdominal exploration surgery  2007  . Esophagogastroduodenoscopy (egd) with propofol N/A 11/22/2013    Dr. Oneida Alar: gastritis and duodenitis, negative H.pylori  . Biopsy N/A 11/22/2013    Procedure: GASTRIC BIOPSIES;  Surgeon: Danie Binder, MD;  Location: [facility] ORS;  Service: Endoscopy;  Laterality: N/A;  . Left and right heart catheterization with coronary angiogram N/A 05/10/2013    Procedure: LEFT AND RIGHT HEART CATHETERIZATION WITH CORONARY ANGIOGRAM;  Surgeon: [name], MD;  Location: [facility] CATH LAB;  Service: Cardiovascular;  Laterality: N/A;  . Left heart catheterization with coronary angiogram N/A 09/03/2013    Procedure: LEFT HEART CATHETERIZATION WITH CORONARY ANGIOGRAM;  Surgeon: Sinclair Grooms, MD;  Location: [facility] CATH LAB;  Service: Cardiovascular;  Laterality: N/A;  . Colonoscopy with propofol N/A 03/29/2014    SLF: 1. No definite source for anemia identified 2. 8 colon polyps removed 3. Severe diverticulosis noted the transverse colon  and right colon 4. Small internal hemorrohids.   . Polypectomy N/A 03/29/2014    Procedure: POLYPECTOMY;  Surgeon: Danie Binder, MD;  Location: [facility] ORS;  Service: Endoscopy;  Laterality: N/A;  ascending colon, sigmoid colon x4, rectal x3  . Left and right heart catheterization with coronary angiogram N/A 04/14/2014    Procedure: LEFT AND RIGHT HEART CATHETERIZATION WITH CORONARY ANGIOGRAM;  Surgeon: Larey Dresser, MD;  Location: [facility] CATH LAB;  Service: Cardiovascular;  Laterality: N/A;  . Agile capsule N/A 06/02/2014    Procedure: AGILE CAPSULE;  Surgeon: Danie Binder, MD;  Location: [facility] ENDO SUITE;  Service: Endoscopy;  Laterality: N/A;  0800  . Givens capsule study N/A 06/10/2014    Procedure: GIVENS CAPSULE STUDY;  Surgeon: Danie Binder, MD;  Location: [facility] ENDO SUITE;  Service: Endoscopy;  Laterality: N/A;  0700  . Ep implantable device N/A 09/13/2014    Procedure: ICD Implant;  Surgeon: [name], MD;  Location: [facility] CV LAB;  Service: Cardiovascular;  Laterality: N/A;  . Colostomy reversal  ?2009  . Ileostomy  ?2009  . Ileostomy closure  ?2010  . Colon surgery    . Cardiac catheterization    . Ep implantable device  09/13/14    ICD St Jude, placed     Family  History  Problem Relation Age of Onset  . CAD Father 20    MI  . CAD Mother 49    MI  . Alcohol abuse Mother   . Colon cancer Mother     in her 56s  . CAD Sister 28    Stent  . Diabetes Sister   . CAD Brother 58    MI  . Cancer Neg Hx     Social History   Social History  . Marital Status: Married    Spouse Name: N/A  . Number of Children: 2  . Years of Education: 10   Occupational History  . Unemployed     Social History Main Topics  . Smoking status: Current Some Day Smoker -- 0.12 packs/day for 48 years    Types: Cigarettes  . Smokeless tobacco: Never Used  . Alcohol Use: No     Comment: last drink in [id number], former alcoholic  . Drug Use: No  . Sexual Activity: No   Other  Topics Concern  . None   Social History Narrative   Lived previously with her spouse in an [place].     Son and daughter   Daughter in [place]   Son in [place]   Two grandchildren.       Presently in a group home [place], moved in 10/13/13.            PHYSICAL EXAMINATION  ECOG PERFORMANCE STATUS: 1 - Symptomatic but completely ambulatory  Filed Vitals:   06/05/15 1010  BP: 102/68  Pulse: 65  Temp: 98.3 F (36.8 C)  Resp: 18    GENERAL:alert, no distress, well nourished, well developed, comfortable, cooperative, smiling and unaccomapnied SKIN: skin color, texture, turgor are normal, no rashes or significant lesions HEAD: Normocephalic, No masses, lesions, tenderness or abnormalities EYES: normal, EOMI, Conjunctiva are pink and non-injected EARS: External ears normal OROPHARYNX:lips, buccal mucosa, and tongue normal and mucous membranes are moist  NECK: supple, trachea midline LYMPH:  no palpable lymphadenopathy BREAST:not examined LUNGS: clear to auscultation  HEART: regular rate & rhythm ABDOMEN:abdomen soft, obese and normal bowel sounds BACK: Back symmetric, no curvature. EXTREMITIES:less then 2 second capillary refill, no joint deformities, effusion, or  inflammation, no skin discoloration, no cyanosis  NEURO: alert & oriented x 3 with fluent speech, no focal motor/sensory deficits, gait normal  LABORATORY DATA: CBC    Component Value Date/Time   WBC 6.3 03/29/2015 0948   RBC 3.78* 03/29/2015 0948   RBC 3.00* 06/16/2014 1705   HGB 10.9* 03/29/2015 0948   HCT 33.4* 03/29/2015 0948   PLT 192 03/29/2015 0948   MCV 88.4 03/29/2015 0948   MCH 28.8 03/29/2015 0948   MCHC 32.6 03/29/2015 0948   RDW 12.9 03/29/2015 0948   LYMPHSABS 1.9 01/03/2015 1430   MONOABS 0.5 01/03/2015 1430   EOSABS 0.1 01/03/2015 1430   BASOSABS 0.1 01/03/2015 1430      Chemistry      Component Value Date/Time   NA 141 03/29/2015 0948   K 3.4* 03/29/2015 0948   CL 102 03/29/2015 0948   CO2 25 03/29/2015 0948   BUN 35* 03/29/2015 0948   CREATININE 2.28* 03/29/2015 0948      Component Value Date/Time   CALCIUM 9.5 03/29/2015 0948   ALKPHOS 66 03/24/2015 1048   AST 45* 03/24/2015 1048   ALT 31 03/24/2015 1048   BILITOT 0.6 03/24/2015 1048     Lab Results  Component Value Date   IRON 80 01/03/2015   TIBC 346 01/03/2015   FERRITIN 91 01/03/2015      PENDING LABS:   RADIOGRAPHIC STUDIES:  No results found.   PATHOLOGY:    ASSESSMENT AND PLAN:  Iron deficiency anemia Iron deficiency anemia in the setting of chronic anticoagulation with Eliquis.  Negative GI work-up.  Oncology Flowsheet 12/01/2014 01/10/2015  ferric gluconate (NULECIT) IV 125 mg 125 mg    Labs today: CBC diff, iron/TIBC, ferritin  Labs in 6 weeks, 12 weeks, and 18 weeks: CBC diff, iron/TIBC, ferritin  Return in 18 weeks for follow-up.    ORDERS PLACED FOR THIS ENCOUNTER: Orders Placed This Encounter  Procedures  . CBC with Differential  . Iron and TIBC  . Ferritin  . CBC with Differential  . Iron and TIBC  . Ferritin  . DNR (Do Not Resuscitate)    MEDICATIONS PRESCRIBED THIS ENCOUNTER: No orders of the defined types were placed in this encounter.     THERAPY PLAN:  We will continue to monitor iron  studies and provide iron support as indicated.  All questions were answered. The patient knows to call the clinic with any problems, questions or concerns. We can certainly see the patient much sooner if necessary.  Patient and plan discussed with Dr. Ancil Linsey and she is in agreement with the aforementioned.   This note is electronically signed by: Doy Mince 06/05/2015 10:52 AM

## 2015-06-06 ENCOUNTER — Other Ambulatory Visit (HOSPITAL_COMMUNITY): Payer: Self-pay | Admitting: Oncology

## 2015-06-06 ENCOUNTER — Encounter (HOSPITAL_COMMUNITY): Payer: Medicaid Other

## 2015-06-07 MED ORDER — HEPARIN SOD (PORK) LOCK FLUSH 100 UNIT/ML IV SOLN
INTRAVENOUS | Status: AC
  Filled 2015-06-07: qty 5

## 2015-06-08 ENCOUNTER — Ambulatory Visit (INDEPENDENT_AMBULATORY_CARE_PROVIDER_SITE_OTHER): Payer: Medicaid Other | Admitting: *Deleted

## 2015-06-08 DIAGNOSIS — I480 Paroxysmal atrial fibrillation: Secondary | ICD-10-CM | POA: Diagnosis not present

## 2015-06-08 LAB — POCT INR: INR: 1.5

## 2015-06-08 MED ORDER — WARFARIN SODIUM 2.5 MG PO TABS: ORAL_TABLET | ORAL | Status: DC

## 2015-06-15 ENCOUNTER — Ambulatory Visit (HOSPITAL_COMMUNITY): Payer: Self-pay

## 2015-06-16 ENCOUNTER — Encounter (HOSPITAL_COMMUNITY): Payer: Self-pay

## 2015-06-16 ENCOUNTER — Encounter (HOSPITAL_BASED_OUTPATIENT_CLINIC_OR_DEPARTMENT_OTHER): Payer: Medicaid Other

## 2015-06-16 VITALS — BP 97/54 | HR 64 | Temp 98.5°F | Resp 18

## 2015-06-16 DIAGNOSIS — D509 Iron deficiency anemia, unspecified: Secondary | ICD-10-CM | POA: Diagnosis present

## 2015-06-16 MED ORDER — NA FERRIC GLUC CPLX IN SUCROSE 12.5 MG/ML IV SOLN
250.0000 mg | Freq: Once | INTRAVENOUS | Status: AC
  Administered 2015-06-16: 250 mg via INTRAVENOUS
  Filled 2015-06-16: qty 20

## 2015-06-16 MED ORDER — SODIUM CHLORIDE 0.9 % IV SOLN
Freq: Once | INTRAVENOUS | Status: AC
  Administered 2015-06-16: 14:00:00 via INTRAVENOUS

## 2015-06-16 NOTE — Patient Instructions (Signed)
[facility] at [facility] Discharge Instructions  RECOMMENDATIONS MADE BY THE CONSULTANT AND ANY TEST RESULTS WILL BE SENT TO YOUR REFERRING PHYSICIAN.  Iron infusion today Follow up as scheduled Please call the clinic if you have any questions or concerns   Thank you for choosing [facility] at [facility] to provide your oncology and hematology care.  To afford each patient quality time with our provider, please arrive at least 15 minutes before your scheduled appointment time.   Beginning January 23rd 2017 lab work for the Cancer Center will be done in the  Main lab at [place] on 1st floor. If you have a lab appointment with the Cancer Center please come in thru the  Main Entrance and check in at the main information desk  You need to re-schedule your appointment should you arrive 10 or more minutes late.  We strive to give you quality time with our providers, and arriving late affects you and other patients whose appointments are after yours.  Also, if you no show three or more times for appointments you may be dismissed from the clinic at the providers discretion.     Again, thank you for choosing [facility].  Our hope is that these requests will decrease the amount of time that you wait before being seen by our physicians.       _____________________________________________________________  Should you have questions after your visit to [facility], please contact our office at [phone] between the hours of 8:30 a.m. and 4:30 p.m.  Voicemails left after 4:30 p.m. will not be returned until the following business day.  For prescription refill requests, have your pharmacy contact our office.         Resources For Cancer Patients and their Caregivers ? American Cancer Society: Can assist with transportation, wigs, general needs, runs Look Good Feel Better.        1-888-227-6333 ? Cancer Care: Provides  financial assistance, online support groups, medication/co-pay assistance.  1-800-813-HOPE (4673) ? [facility] Assists [place] cancer patients and their families through emotional , educational and financial support.  336-427-4357 ? [place] DSS Where to apply for food stamps, Medicaid and utility assistance. 336-342-1394 ? [facility]: Transportation to medical appointments. 336-347-2287 ? Social Security Administration: May apply for disability if have a Stage IV cancer. 336-342-7796 1-800-772-1213 ? [place] Aging, Disability and Transit Services: Assists with nutrition, care and transit needs. 336-349-2343  Cancer Center Support Programs: @10RELATIVEDAYS@ > Cancer Support Group  2nd Tuesday of the month 1pm-2pm, Journey Room  > Creative Journey  3rd Tuesday of the month 1130am-1pm, Journey Room  > Look Good Feel Better  1st Wednesday of the month 10am-12 noon, Journey Room (Call American Cancer Society to register 1-800-395-5775)    

## 2015-06-16 NOTE — Progress Notes (Signed)
[name] Tolerated iron infusion well  Discharged after 30 minute wait period

## 2015-06-20 ENCOUNTER — Encounter (HOSPITAL_COMMUNITY): Payer: Self-pay

## 2015-06-20 ENCOUNTER — Ambulatory Visit (INDEPENDENT_AMBULATORY_CARE_PROVIDER_SITE_OTHER): Payer: Medicaid Other | Admitting: *Deleted

## 2015-06-20 DIAGNOSIS — I480 Paroxysmal atrial fibrillation: Secondary | ICD-10-CM

## 2015-06-20 LAB — POCT INR: INR: 2.4

## 2015-06-20 MED ORDER — WARFARIN SODIUM 2.5 MG PO TABS: ORAL_TABLET | ORAL | Status: DC

## 2015-06-21 ENCOUNTER — Ambulatory Visit (INDEPENDENT_AMBULATORY_CARE_PROVIDER_SITE_OTHER): Payer: Medicaid Other | Admitting: *Deleted

## 2015-06-21 DIAGNOSIS — I5023 Acute on chronic systolic (congestive) heart failure: Secondary | ICD-10-CM | POA: Diagnosis not present

## 2015-06-21 DIAGNOSIS — I255 Ischemic cardiomyopathy: Secondary | ICD-10-CM | POA: Diagnosis not present

## 2015-06-21 DIAGNOSIS — Z95810 Presence of automatic (implantable) cardiac defibrillator: Secondary | ICD-10-CM | POA: Diagnosis not present

## 2015-06-21 NOTE — Progress Notes (Signed)
Remote ICD transmission.   

## 2015-06-21 NOTE — Progress Notes (Signed)
EPIC Encounter for ICM Monitoring  Patient Name: [name] is a 60 y.o. female Date: 06/21/2015 Primary Care Physican: [name], MD Primary Cardiologist: Aundra Dubin  Electrophysiologist: Allred Dry Weight: 119 lbs       In the past month, have you:  1. Gained more than 2 pounds in a day or more than 5 pounds in a week? no  2. Had changes in your medications (with verification of current medications)? no  3. Had more shortness of breath than is usual for you? no  4. Limited your activity because of shortness of breath? no  5. Not been able to sleep because of shortness of breath? no  6. Had increased swelling in your feet, ankles, legs or stomach area? no  7. Had symptoms of dehydration (dizziness, dry mouth, increased thirst, decreased urine output) no  8. Had changes in sodium restriction? no  9. Been compliant with medication? Yes  ICM trend: 3 month view for 06/21/2015   ICM trend: 1 year view for 06/21/2015   Follow-up plan: ICM clinic phone appointment 07/26/2015.    FLUID LEVELS:  Corvue thoracic impedance trending along reference line suggesting stable fluid levels.    SYMPTOMS:  Denied any symptoms such as weight gain of 3 pounds overnight or 5 pounds within a week, SOB and/or lower extremity swelling. Encouraged to call for any fluid symptoms.   EDUCATION: Limit sodium intake to < 2000 mg and fluid intake to 64 oz daily.     RECOMMENDATIONS: No changes today.    Rosalene Billings, RN, CCM 06/21/2015 1:40 PM

## 2015-07-04 ENCOUNTER — Ambulatory Visit (INDEPENDENT_AMBULATORY_CARE_PROVIDER_SITE_OTHER): Payer: Medicaid Other | Admitting: *Deleted

## 2015-07-04 DIAGNOSIS — I480 Paroxysmal atrial fibrillation: Secondary | ICD-10-CM | POA: Diagnosis not present

## 2015-07-04 LAB — POCT INR: INR: 1.6

## 2015-07-04 MED ORDER — WARFARIN SODIUM 2.5 MG PO TABS: ORAL_TABLET | ORAL | Status: DC

## 2015-07-06 LAB — CUP PACEART REMOTE DEVICE CHECK
Battery Remaining Percentage: 90 %
Battery Voltage: 3.14 V
Brady Statistic RV Percent Paced: 1 %
HIGH POWER IMPEDANCE MEASURED VALUE: 82 Ohm
HIGH POWER IMPEDANCE MEASURED VALUE: 82 Ohm
Implantable Lead Implant Date: 20160823
Lead Channel Impedance Value: 630 Ohm
Lead Channel Pacing Threshold Amplitude: 0.5 V
Lead Channel Pacing Threshold Pulse Width: 0.5 ms
Lead Channel Sensing Intrinsic Amplitude: 11.9 mV
Lead Channel Setting Pacing Amplitude: 2.5 V
Lead Channel Setting Pacing Pulse Width: 0.5 ms
MDC IDC LEAD LOCATION: 753860
MDC IDC MSMT BATTERY REMAINING LONGEVITY: 100 mo
MDC IDC PG SERIAL: 7254873
MDC IDC SESS DTM: 20170531060016
MDC IDC SET LEADCHNL RV SENSING SENSITIVITY: 0.5 mV

## 2015-07-12 ENCOUNTER — Encounter: Payer: Self-pay | Admitting: Cardiology

## 2015-07-13 ENCOUNTER — Ambulatory Visit (INDEPENDENT_AMBULATORY_CARE_PROVIDER_SITE_OTHER): Payer: Medicaid Other | Admitting: *Deleted

## 2015-07-13 DIAGNOSIS — I480 Paroxysmal atrial fibrillation: Secondary | ICD-10-CM

## 2015-07-13 LAB — POCT INR: INR: 2.1

## 2015-07-13 MED ORDER — WARFARIN SODIUM 2.5 MG PO TABS: ORAL_TABLET | ORAL | Status: DC

## 2015-07-17 ENCOUNTER — Encounter (HOSPITAL_COMMUNITY): Payer: Medicaid Other | Attending: Oncology

## 2015-07-17 DIAGNOSIS — Z9889 Other specified postprocedural states: Secondary | ICD-10-CM | POA: Insufficient documentation

## 2015-07-17 DIAGNOSIS — Z811 Family history of alcohol abuse and dependence: Secondary | ICD-10-CM | POA: Diagnosis not present

## 2015-07-17 DIAGNOSIS — Z809 Family history of malignant neoplasm, unspecified: Secondary | ICD-10-CM | POA: Diagnosis not present

## 2015-07-17 DIAGNOSIS — Z8673 Personal history of transient ischemic attack (TIA), and cerebral infarction without residual deficits: Secondary | ICD-10-CM | POA: Diagnosis not present

## 2015-07-17 DIAGNOSIS — K5790 Diverticulosis of intestine, part unspecified, without perforation or abscess without bleeding: Secondary | ICD-10-CM | POA: Diagnosis not present

## 2015-07-17 DIAGNOSIS — F17210 Nicotine dependence, cigarettes, uncomplicated: Secondary | ICD-10-CM | POA: Diagnosis not present

## 2015-07-17 DIAGNOSIS — Z833 Family history of diabetes mellitus: Secondary | ICD-10-CM | POA: Diagnosis not present

## 2015-07-17 DIAGNOSIS — K219 Gastro-esophageal reflux disease without esophagitis: Secondary | ICD-10-CM | POA: Diagnosis not present

## 2015-07-17 DIAGNOSIS — I5022 Chronic systolic (congestive) heart failure: Secondary | ICD-10-CM | POA: Insufficient documentation

## 2015-07-17 DIAGNOSIS — D509 Iron deficiency anemia, unspecified: Secondary | ICD-10-CM | POA: Diagnosis present

## 2015-07-17 LAB — CBC WITH DIFFERENTIAL/PLATELET
BASOS ABS: 0 10*3/uL (ref 0.0–0.1)
Basophils Relative: 1 %
EOS PCT: 2 %
Eosinophils Absolute: 0.1 10*3/uL (ref 0.0–0.7)
HCT: 33 % — ABNORMAL LOW (ref 36.0–46.0)
Hemoglobin: 10.6 g/dL — ABNORMAL LOW (ref 12.0–15.0)
LYMPHS PCT: 29 %
Lymphs Abs: 1.5 10*3/uL (ref 0.7–4.0)
MCH: 30.3 pg (ref 26.0–34.0)
MCHC: 32.1 g/dL (ref 30.0–36.0)
MCV: 94.3 fL (ref 78.0–100.0)
MONO ABS: 0.5 10*3/uL (ref 0.1–1.0)
Monocytes Relative: 9 %
Neutro Abs: 3 10*3/uL (ref 1.7–7.7)
Neutrophils Relative %: 59 %
PLATELETS: 180 10*3/uL (ref 150–400)
RBC: 3.5 MIL/uL — ABNORMAL LOW (ref 3.87–5.11)
RDW: 12.8 % (ref 11.5–15.5)
WBC: 5.2 10*3/uL (ref 4.0–10.5)

## 2015-07-17 LAB — IRON AND TIBC
Iron: 65 ug/dL (ref 28–170)
SATURATION RATIOS: 22 % (ref 10.4–31.8)
TIBC: 297 ug/dL (ref 250–450)
UIBC: 232 ug/dL

## 2015-07-17 LAB — FERRITIN: Ferritin: 118 ng/mL (ref 11–307)

## 2015-07-17 NOTE — Progress Notes (Signed)
Patient ID: [name], female   DOB: Apr 16, 1955, 60 y.o.   MRN: [id number]    Advanced Heart Failure Clinic Note   PCP: N/A Cardiologist: Dr. Aundra Dubin EP: Dr Caryl Comes  Nephrology: Lowanda Foster  HPI: [name] is a 60 y/o female with a history of ETOH abuse, ICM, HTN, stroke and chronic systolic heart failure.  Admitted to [facility] 05/06/13 for pancreatitis. She had severely reduced systolic function with an EF of 20-25%. 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 Discharge weight was 140 pounds.   Admitted to [facility] 05/2013 with A fib RVR. Started on amiodarone 400 mg twice a day and Xarelto 20 mg daily. Chemically converted to NSR.  Admitted to [facility]  08/2013 with chest pain. Troponin was elevated. LHC was unchanged from April 2015 so she had CMRI that showed LAD territory was not viable, EF 28%. Plan to continue to treat medically.   Admitted 9/13 through 10/13/13 after attempted suicide. Overdosed on all HF medications. Discharged off carvedilol, lasix, lisinopril, xarelto and digoxin. Discharge weight 120 pounds. Discharged to [facility] which is a group home.  She has been doing much better since that time and is on sertraline.  Had colonoscopy on 03/29/14 with 8 polyps and diverticulosis. She developed increased dyspnea and had LHC/RHC in 3/16 showing mean RA 2, PA 45/21 (mean 34), PCWP mean 21, CI 2.77; LAD totally occluded with some collaterals from RCA, 40-50% OM1, 90% complex ostial PLV. No significant change on coronary angiography.  I thought that her dyspnea may have been due to worsened anemia.   In 8/16, she had St Jude ICD implanted.   Admitted to [facility] 1/24 through 02/24/2015. Treated for sepsis secondary to [facility]. On admit creatinine was  7.7. Entresto and spiro stopped. Due to anemia she was transfused 2UPRBCs. Blood loss thought to be from gastritis and duodenitis as well chronic anticoagulants. Eliquis  was stopped. Discharged with nephrology and gastrology.  Discharge weight was 134 pounds.   She returns for HF follow up. No changes at last visit with stable nature of her symptoms and CKD stage IV prohibiting up titration. Feeling good overall. Staying at [facility] still, working on outside housing with Social Work. No DOE on flat ground.  Gets a mild SOB a flight of steps, but can do a few no problems. Denies PND or orthopnea. Denies bendopnea or peripheral edema. Taking all medications. Weight at home steady at 119-122 pounds. No BRBPR or melena. Continues to smoke 1-2 cigarettes per day. Mostly does it for nerves, can't seem to kick habit. Still following with renal.  Creatinine remains in 5s per patient.  ECHO 09/02/13 EF 25-30%  ECHO 03/2014: EF 25-30%  ECHO 02/15/2015: EF 25-30%.   Labs 05/15/13 K 3.7 Creatinine 0.97 Labs 06/02/13 K 3.9 Creatinine 1.0 Dig level 1.3 --> cut back dig to 0.0625 mg Labs 06/08/13 K 4.7 Creatinine 1.1 Dig level 0.4  Labs 06/19/13: K 4.1, creatinine 1.35 Labs 08/05/13: K 4.5, creatinine 1.17, AST 25, ALT 28, cholesterol 188, TG 185, HDL 41, LDL 110 Labs 08/17/13: K 4.9 Creatinine 2.0  Labs 09/06/13: Dig level 0.9 K 4.5 Creatinine 1.13 Labs 10/13/13 K 5.1 Creatinine 2.1 Labs 10/20/13 K 4.1 Creatinine 1.32 Labs 01/27/2014: Hgb 9.9, LDL 108  Labs 3/16: K 4.4, creatinine 1.08, hgb 8.6 Labs 9/16: K 3.6, creatinine 1.31, HCT 34.8, TnI 0.07 max.  Labs 10/16: K 4.5, creatinine 1.27 =>  1.34, LDL 111, HDL 36 Labs 12/16: HCT 39.9 Labs 2/17: K 3.8, creatinine 5.09 Labs 03/29/2015: K 3.4 Creatinine 2.28    SH: Lives in a group home. Unemployed. Smoker . Does not drink  alcohol   FH: Father MI at the age of 56  ROS: All systems negative except as listed in HPI, PMH and Problem List.  Past Medical History  Diagnosis Date  . Diverticulosis   . Pancreatitis 1980, 2015    in the setting of ETOH  . chronic systolic heart failure     a. ECHO (05/08/13): EF 20-25%, diff HK with  akinesis of basal inferior wall, grade 1 DD, trivial MR, trivial TR b) RHC (05/06/13): RA 7, RV 30/2, PA 31/19 (24), PCWP 10, Fick CO/CI: 2.9/1.74, Thermo CO/Ci: 2.4/1.4, PA sat 53%  . Ischemic cardiomyopathy   . CAD (coronary artery disease)     a. LHC (04/2013): Chronically occluded LAD, moderate dz in large OM1 and severe dz and small posterior lateral vessel    . Persistent atrial fibrillation ([place]) 2015  . Suicidal overdose ([place]) 10/03/13    Overdoes on Beta Blockers & Xarelto [facility])  . Anxiety 2015   . Psoriasis 1968  . GERD (gastroesophageal reflux disease) 2015   . Hyperlipidemia 2015  . Hypertension 2015  . ETOH abuse 1981    started abusing alcohol at age 14, quit   . Hypothyroidism   . PONV (postoperative nausea and vomiting)   . Mass of right breast on mammogram 07/14/2014  . AICD (automatic cardioverter/defibrillator) present   . Myocardial infarction ([place])     "I've had many" (09/13/2014)  . Anginal pain ([place])   . Anemia   . History of blood transfusion     "related to losing blood from somewhere; never found out from where"  . Stroke [facility]) 2011    left side weakness, minimal  . Depression   . 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/2014  . DNR (do not resuscitate) 06/05/2015    Confirmed with patient on 06/05/2015    Current Outpatient Prescriptions  Medication Sig Dispense Refill  . acetaminophen (TYLENOL) 325 MG tablet Take 650 mg by mouth every 6 (six) hours as needed for mild pain.    Marland Kitchen ammonium lactate (AMLACTIN) 12 % cream Apply 1 g topically daily as needed for dry skin (apply to elbows).     Marland Kitchen atorvastatin (LIPITOR) 80 MG tablet Take 1 tablet (80 mg total) by mouth daily. 30 tablet 6  . calcium carbonate (OS-CAL - DOSED IN MG OF ELEMENTAL CALCIUM) 1250 (500 Ca) MG tablet Take 1 tablet (500 mg of elemental calcium total) by mouth 3 (three) times daily with meals. 90 tablet 3  . carvedilol (COREG) 12.5 MG tablet Take 1 tablet  (12.5 mg total) by mouth 2 (two) times daily. 60 tablet 6  . levothyroxine (SYNTHROID, LEVOTHROID) 25 MCG tablet Take 25 mcg by mouth daily before breakfast.     . LORazepam (ATIVAN) 0.5 MG tablet Take 1 tablet (0.5 mg total) by mouth 2 (two) times daily. 60 tablet 2  . nitroGLYCERIN (NITROSTAT) 0.3 MG SL tablet Place 0.3 mg under the tongue every 5 (five) minutes as needed for chest pain (MAX 3 TABLETS). Reported on 04/05/2015    . ondansetron (ZOFRAN) 4 MG tablet Take 4 mg by mouth every 8 (eight) hours as needed for nausea or vomiting. Reported on 03/08/2015    . pantoprazole (PROTONIX) 40 MG tablet TAKE 1 TABLET  BY MOUTH TWICE DAILY BEFORE A MEAL. 60 tablet 5  . potassium chloride SA (K-DUR,KLOR-CON) 20 MEQ tablet Take 1 tablet (20 mEq total) by mouth daily. 30 tablet 6  . sertraline (ZOLOFT) 50 MG tablet Take 100 mg by mouth daily.    Marland Kitchen torsemide (DEMADEX) 20 MG tablet Take 1 tablet (20 mg total) by mouth daily. 30 tablet 2  . warfarin (COUMADIN) 2.5 MG tablet Continue 1 1/2 tablets daily except 2 tablets on Mondays, Wednesdays and Fridays till INR check on 07/27/15. 55 tablet 1   No current facility-administered medications for this encounter.    Filed Vitals:   07/18/15 0955  BP: 98/62  Pulse: 64  Weight: 124 lb (56.246 kg)  SpO2: 100%   Wt Readings from Last 3 Encounters:  07/18/15 124 lb (56.246 kg)  06/05/15 121 lb (54.885 kg)  05/17/15 120 lb (54.432 kg)     PHYSICAL EXAM: General: NAD HEENT: normal Neck: supple. JVP flat. Carotids 2+ bilaterally; no bruits. No lymphadenopathy or thryomegaly appreciated. Cor: PMI normal. Regular rate & rhythm. No rubs, gallops or murmurs. Lungs: clear Abdomen: soft, nontender, nondistended. No hepatosplenomegaly. No bruits or masses. Good bowel sounds. Extremities: no cyanosis, clubbing, rash, edema Neuro: alert & orientedx3, cranial nerves grossly intact. Moves all 4 extremities w/o difficulty. Affect pleasant.  ASSESSMENT & PLAN: 1.  Chronic Systolic Heart Failure: Primarily ischemic cardiomyopathy though ETOH may play a role. EF 25-30% (08/2013).  Cardiac MRI in August 2015 showed no viability in the LAD territory with EF 28%. Most recent ECHO (01/2015) EF 25-30%.   Now s/p St Jude ICD.   NYHA II. Volume status stable.  - Continue torsemide 20 mg daily and potassium 20 meq daily. BMET today.  - Will not add back entresto and spironolactone with CKD stage V. - Continue coreg 12.5 mg twice a day. - Off  hydralazine and Imdur with hypotension.   - Now has LBBB, in future can consider upgrade to CRT.  2. CAD: LHC with chronically occluded LAD and severe disease in small PLV.  No change on 3/16 cath compared to prior. cMRI showed no viability in LAD territory.    - Not on aspirin or apixaban with ongoing bleeding issues.   - She is on coumadin.  3. Current smoker: Encouraged again to stop smoking.  Continues to smoke 1-2 cigarettes a day due to "nerves" 4. Former Alcohol Abuse: Quit April 2015.  5. AF: CHADS Vasc Score 4 . Paroxysmal. NSR today. Off apixaban due to recent GI bleed.   Has seen GI, recommend continuing PPI. On coumadin no bleeding problems. . Will aim for INR 2-2.5.     6. Suicide Attempt:  9/15 suicide attempt via overdose.  She is now on sertraline and doing much better. Resolved.  7. Hyperlipidemia: Goal LDL < 70. Off Crestor with GFR<30.  Continue atorvastatin 80 mg daily.  Check lipids next visit.  8. Fe deficiency anemia: Gastritis/duodenitis.  On PPI. Off eliquis due to recent GI bleed. Tolerating coumadin.    9. CKD Stage V:  - Followed closely by Nephrology. No plan for dialysis yet. Reminded to not take ibuprofen.   Labs today. Follow up 2 months with MD. No med changes with problems above (borderline hypotension and CKD IV-V).    Will have HFSW see again today about housing.   Satira Mccallum Marnette Perkins PA-C  07/18/2015

## 2015-07-18 ENCOUNTER — Ambulatory Visit (HOSPITAL_COMMUNITY)
Admission: RE | Admit: 2015-07-18 | Discharge: 2015-07-18 | Disposition: A | Discharge status: Home / Self Care | Payer: Medicaid Other | Source: Ambulatory Visit | Attending: Cardiology | Admitting: Cardiology

## 2015-07-18 ENCOUNTER — Encounter (HOSPITAL_COMMUNITY): Payer: Self-pay

## 2015-07-18 ENCOUNTER — Encounter: Payer: Self-pay | Admitting: Licensed Clinical Social Worker

## 2015-07-18 VITALS — BP 98/62 | HR 64 | Wt 124.0 lb

## 2015-07-18 DIAGNOSIS — F419 Anxiety disorder, unspecified: Secondary | ICD-10-CM | POA: Insufficient documentation

## 2015-07-18 DIAGNOSIS — Z8249 Family history of ischemic heart disease and other diseases of the circulatory system: Secondary | ICD-10-CM | POA: Diagnosis not present

## 2015-07-18 DIAGNOSIS — I2510 Atherosclerotic heart disease of native coronary artery without angina pectoris: Secondary | ICD-10-CM | POA: Diagnosis not present

## 2015-07-18 DIAGNOSIS — Z86010 Personal history of colonic polyps: Secondary | ICD-10-CM | POA: Diagnosis not present

## 2015-07-18 DIAGNOSIS — R0602 Shortness of breath: Secondary | ICD-10-CM | POA: Insufficient documentation

## 2015-07-18 DIAGNOSIS — F1011 Alcohol abuse, in remission: Secondary | ICD-10-CM

## 2015-07-18 DIAGNOSIS — E039 Hypothyroidism, unspecified: Secondary | ICD-10-CM | POA: Insufficient documentation

## 2015-07-18 DIAGNOSIS — Z720 Tobacco use: Secondary | ICD-10-CM | POA: Diagnosis not present

## 2015-07-18 DIAGNOSIS — I255 Ischemic cardiomyopathy: Secondary | ICD-10-CM | POA: Diagnosis not present

## 2015-07-18 DIAGNOSIS — Z8673 Personal history of transient ischemic attack (TIA), and cerebral infarction without residual deficits: Secondary | ICD-10-CM | POA: Insufficient documentation

## 2015-07-18 DIAGNOSIS — K219 Gastro-esophageal reflux disease without esophagitis: Secondary | ICD-10-CM | POA: Insufficient documentation

## 2015-07-18 DIAGNOSIS — I252 Old myocardial infarction: Secondary | ICD-10-CM | POA: Insufficient documentation

## 2015-07-18 DIAGNOSIS — I447 Left bundle-branch block, unspecified: Secondary | ICD-10-CM | POA: Insufficient documentation

## 2015-07-18 DIAGNOSIS — F1021 Alcohol dependence, in remission: Secondary | ICD-10-CM | POA: Insufficient documentation

## 2015-07-18 DIAGNOSIS — Z66 Do not resuscitate: Secondary | ICD-10-CM | POA: Diagnosis not present

## 2015-07-18 DIAGNOSIS — I481 Persistent atrial fibrillation: Secondary | ICD-10-CM | POA: Diagnosis not present

## 2015-07-18 DIAGNOSIS — I2583 Coronary atherosclerosis due to lipid rich plaque: Secondary | ICD-10-CM

## 2015-07-18 DIAGNOSIS — F329 Major depressive disorder, single episode, unspecified: Secondary | ICD-10-CM | POA: Diagnosis not present

## 2015-07-18 DIAGNOSIS — Z95810 Presence of automatic (implantable) cardiac defibrillator: Secondary | ICD-10-CM | POA: Insufficient documentation

## 2015-07-18 DIAGNOSIS — E785 Hyperlipidemia, unspecified: Secondary | ICD-10-CM | POA: Insufficient documentation

## 2015-07-18 DIAGNOSIS — I480 Paroxysmal atrial fibrillation: Secondary | ICD-10-CM

## 2015-07-18 DIAGNOSIS — Z8719 Personal history of other diseases of the digestive system: Secondary | ICD-10-CM | POA: Insufficient documentation

## 2015-07-18 DIAGNOSIS — K2980 Duodenitis without bleeding: Secondary | ICD-10-CM | POA: Diagnosis not present

## 2015-07-18 DIAGNOSIS — D509 Iron deficiency anemia, unspecified: Secondary | ICD-10-CM | POA: Diagnosis not present

## 2015-07-18 DIAGNOSIS — N185 Chronic kidney disease, stage 5: Secondary | ICD-10-CM | POA: Diagnosis not present

## 2015-07-18 DIAGNOSIS — I129 Hypertensive chronic kidney disease with stage 1 through stage 4 chronic kidney disease, or unspecified chronic kidney disease: Secondary | ICD-10-CM | POA: Insufficient documentation

## 2015-07-18 DIAGNOSIS — K2970 Gastritis, unspecified, without bleeding: Secondary | ICD-10-CM | POA: Insufficient documentation

## 2015-07-18 DIAGNOSIS — Z7901 Long term (current) use of anticoagulants: Secondary | ICD-10-CM | POA: Insufficient documentation

## 2015-07-18 DIAGNOSIS — I5022 Chronic systolic (congestive) heart failure: Secondary | ICD-10-CM | POA: Diagnosis present

## 2015-07-18 DIAGNOSIS — I959 Hypotension, unspecified: Secondary | ICD-10-CM | POA: Diagnosis not present

## 2015-07-18 DIAGNOSIS — Z915 Personal history of self-harm: Secondary | ICD-10-CM | POA: Diagnosis not present

## 2015-07-18 DIAGNOSIS — F17210 Nicotine dependence, cigarettes, uncomplicated: Secondary | ICD-10-CM | POA: Diagnosis not present

## 2015-07-18 DIAGNOSIS — F1010 Alcohol abuse, uncomplicated: Secondary | ICD-10-CM

## 2015-07-18 LAB — BASIC METABOLIC PANEL
ANION GAP: 4 — AB (ref 5–15)
BUN: 22 mg/dL — AB (ref 6–20)
CHLORIDE: 107 mmol/L (ref 101–111)
CO2: 30 mmol/L (ref 22–32)
Calcium: 9.6 mg/dL (ref 8.9–10.3)
Creatinine, Ser: 1.96 mg/dL — ABNORMAL HIGH (ref 0.44–1.00)
GFR calc Af Amer: 31 mL/min — ABNORMAL LOW (ref >60)
GFR calc non Af Amer: 27 mL/min — ABNORMAL LOW (ref >60)
GLUCOSE: 83 mg/dL (ref 65–99)
POTASSIUM: 4.4 mmol/L (ref 3.5–5.1)
Sodium: 141 mmol/L (ref 135–145)

## 2015-07-18 NOTE — Progress Notes (Signed)
CSW met with patient at her request to discuss options for moving to [place], [place] to be closer to her sister. Patient reports she has a few options provided to her by her sister and asked CSW for feedback. CSW instructed patient that CSW is not familiar with homes in [place]. Patient encouraged to visit homes and tour to determine if any are a good fit for her. CSW found  two additional options and forwarded to her sister's e-mail per her request. Patient will follow up and return call to CSW if further needs arise. CSW continues to be available as needed. Raquel Sarna, LCSW [phone]

## 2015-07-18 NOTE — Progress Notes (Signed)
Advanced Heart Failure Medication Review by a Pharmacist  Does the patient  feel that his/her medications are working for him/her?  yes  Has the patient been experiencing any side effects to the medications prescribed?  no  Does the patient measure his/her own blood pressure or blood glucose at home?  Yes, patient stays at a group home   Does the patient have any problems obtaining medications due to transportation or finances?   no  Understanding of regimen: good Understanding of indications: good Potential of compliance: excellent Patient understands to avoid NSAIDs. Patient understands to avoid decongestants.  Pharmacist comments: [name] is a pleasant 36 yof presenting to the clinic for a follow-up appointment. She denies any medication-related side effects at this time and also denies any swelling, dizziness or SOB. She stays at a group home but still has a very thorough understanding of her medication regimen and indications. She had a question regarding her coumadin dosing at the facility and I looked into the dosing and helped to straighten out her regimen. She did not have any other medication-related questions or concerns at this time.   Ellie Spickler C. Lennox Grumbles, PharmD Pharmacy Resident  Pager: [phone] 07/18/2015 10:11 AM  Time with patient: 10 min  Preparation and documentation time: 2 min  Total time: 12 min

## 2015-07-18 NOTE — Patient Instructions (Signed)
Labs today  Your physician recommends that you schedule a follow-up appointment in: 2 months with Dr Aundra Dubin  Do the following things EVERYDAY: 1) Weigh yourself in the morning before breakfast. Write it down and keep it in a log. 2) Take your medicines as prescribed 3) Eat low salt foods-Limit salt (sodium) to 2000 mg per day.  4) Stay as active as you can everyday 5) Limit all fluids for the day to less than 2 liters 6)

## 2015-07-26 ENCOUNTER — Ambulatory Visit (INDEPENDENT_AMBULATORY_CARE_PROVIDER_SITE_OTHER): Payer: Medicaid Other

## 2015-07-26 DIAGNOSIS — Z95810 Presence of automatic (implantable) cardiac defibrillator: Secondary | ICD-10-CM | POA: Diagnosis not present

## 2015-07-26 DIAGNOSIS — I5022 Chronic systolic (congestive) heart failure: Secondary | ICD-10-CM | POA: Diagnosis not present

## 2015-07-27 ENCOUNTER — Ambulatory Visit (INDEPENDENT_AMBULATORY_CARE_PROVIDER_SITE_OTHER): Payer: Medicaid Other | Admitting: *Deleted

## 2015-07-27 ENCOUNTER — Encounter: Payer: Self-pay | Admitting: Cardiology

## 2015-07-27 DIAGNOSIS — I480 Paroxysmal atrial fibrillation: Secondary | ICD-10-CM

## 2015-07-27 LAB — POCT INR: INR: 1.9

## 2015-07-27 MED ORDER — WARFARIN SODIUM 2.5 MG PO TABS: ORAL_TABLET | ORAL | Status: DC

## 2015-07-27 NOTE — Progress Notes (Signed)
EPIC Encounter for ICM Monitoring  Patient Name: [name] is a 60 y.o. female Date: 07/27/2015 Primary Care Physican: [name], MD Primary Cardiologist: Aundra Dubin Electrophysiologist: Allred Dry Weight: 123 lb         Heart Failure questions reviewed, pt asymptomatic  Thoracic impedence below baseline 07/18/2015 to 07/21/2015 and returned to baseline suggesting stable fluid levels. Low sodium diet education provided   ICM trend: 07/26/2015      Follow-up plan: ICM clinic phone appointment on 08/30/2015.  Copy of ICM check sent to device physician.   Rosalene Billings, RN 07/27/2015 10:10 AM

## 2015-08-10 ENCOUNTER — Ambulatory Visit (INDEPENDENT_AMBULATORY_CARE_PROVIDER_SITE_OTHER): Payer: Medicaid Other | Admitting: *Deleted

## 2015-08-10 ENCOUNTER — Telehealth: Payer: Self-pay | Admitting: Licensed Clinical Social Worker

## 2015-08-10 DIAGNOSIS — I480 Paroxysmal atrial fibrillation: Secondary | ICD-10-CM

## 2015-08-10 LAB — POCT INR: INR: 3.3

## 2015-08-10 MED ORDER — WARFARIN SODIUM 2.5 MG PO TABS: ORAL_TABLET | ORAL | Status: DC

## 2015-08-10 NOTE — Telephone Encounter (Signed)
CSW contacted patient via phone to discuss requested transfer to group home in [place]. Patient's sister lives in [place] and patient would like to move closer to her sister [name]. Santiago Glad reports via e-mail to this CSW that she has made contact with [facility] in [place] and reports she has received a list of medicaid facilities in [place]. Patient states that she has not yet received from her sister. Patient will follow up with sister to obtain and explore her options. Patient needs to pick an available facility first and then proceed with transfer process. Patient verbalizes understanding and will reach out to her sister for the list. Patient has a follow up appointment end of August in HF clinic and will meet with CSW at that time. CSW continues to be available as needed with requested transfer. Raquel Sarna, LCSW [phone]

## 2015-08-24 ENCOUNTER — Ambulatory Visit (INDEPENDENT_AMBULATORY_CARE_PROVIDER_SITE_OTHER): Payer: Medicaid Other | Admitting: *Deleted

## 2015-08-24 DIAGNOSIS — I480 Paroxysmal atrial fibrillation: Secondary | ICD-10-CM

## 2015-08-24 LAB — POCT INR: INR: 2.3

## 2015-08-24 MED ORDER — WARFARIN SODIUM 2.5 MG PO TABS: ORAL_TABLET | ORAL | 1 refills | Status: DC

## 2015-08-28 ENCOUNTER — Encounter (HOSPITAL_COMMUNITY): Payer: Medicaid Other | Attending: Oncology

## 2015-08-28 ENCOUNTER — Other Ambulatory Visit (HOSPITAL_COMMUNITY): Payer: Self-pay | Admitting: Oncology

## 2015-08-28 DIAGNOSIS — K5790 Diverticulosis of intestine, part unspecified, without perforation or abscess without bleeding: Secondary | ICD-10-CM | POA: Insufficient documentation

## 2015-08-28 DIAGNOSIS — D509 Iron deficiency anemia, unspecified: Secondary | ICD-10-CM

## 2015-08-28 DIAGNOSIS — Z9889 Other specified postprocedural states: Secondary | ICD-10-CM | POA: Diagnosis not present

## 2015-08-28 DIAGNOSIS — K219 Gastro-esophageal reflux disease without esophagitis: Secondary | ICD-10-CM | POA: Diagnosis not present

## 2015-08-28 DIAGNOSIS — I5022 Chronic systolic (congestive) heart failure: Secondary | ICD-10-CM | POA: Diagnosis not present

## 2015-08-28 DIAGNOSIS — Z833 Family history of diabetes mellitus: Secondary | ICD-10-CM | POA: Diagnosis not present

## 2015-08-28 DIAGNOSIS — F17210 Nicotine dependence, cigarettes, uncomplicated: Secondary | ICD-10-CM | POA: Insufficient documentation

## 2015-08-28 DIAGNOSIS — Z809 Family history of malignant neoplasm, unspecified: Secondary | ICD-10-CM | POA: Diagnosis not present

## 2015-08-28 DIAGNOSIS — Z811 Family history of alcohol abuse and dependence: Secondary | ICD-10-CM | POA: Diagnosis not present

## 2015-08-28 DIAGNOSIS — Z8673 Personal history of transient ischemic attack (TIA), and cerebral infarction without residual deficits: Secondary | ICD-10-CM | POA: Diagnosis not present

## 2015-08-28 LAB — CBC WITH DIFFERENTIAL/PLATELET
BASOS ABS: 0.1 10*3/uL (ref 0.0–0.1)
Basophils Relative: 1 %
EOS PCT: 3 %
Eosinophils Absolute: 0.2 10*3/uL (ref 0.0–0.7)
HCT: 29.4 % — ABNORMAL LOW (ref 36.0–46.0)
Hemoglobin: 9.8 g/dL — ABNORMAL LOW (ref 12.0–15.0)
LYMPHS ABS: 1.5 10*3/uL (ref 0.7–4.0)
LYMPHS PCT: 29 %
MCH: 30.8 pg (ref 26.0–34.0)
MCHC: 33.3 g/dL (ref 30.0–36.0)
MCV: 92.5 fL (ref 78.0–100.0)
MONO ABS: 0.4 10*3/uL (ref 0.1–1.0)
MONOS PCT: 8 %
Neutro Abs: 3 10*3/uL (ref 1.7–7.7)
Neutrophils Relative %: 59 %
PLATELETS: 195 10*3/uL (ref 150–400)
RBC: 3.18 MIL/uL — ABNORMAL LOW (ref 3.87–5.11)
RDW: 12.6 % (ref 11.5–15.5)
WBC: 5.2 10*3/uL (ref 4.0–10.5)

## 2015-08-28 LAB — IRON AND TIBC
Iron: 44 ug/dL (ref 28–170)
Saturation Ratios: 15 % (ref 10.4–31.8)
TIBC: 301 ug/dL (ref 250–450)
UIBC: 257 ug/dL

## 2015-08-28 LAB — FERRITIN: Ferritin: 46 ng/mL (ref 11–307)

## 2015-08-30 ENCOUNTER — Encounter: Payer: Self-pay | Admitting: Nurse Practitioner

## 2015-08-30 ENCOUNTER — Ambulatory Visit (INDEPENDENT_AMBULATORY_CARE_PROVIDER_SITE_OTHER): Payer: Medicaid Other

## 2015-08-30 ENCOUNTER — Encounter (HOSPITAL_COMMUNITY): Payer: Self-pay

## 2015-08-30 ENCOUNTER — Ambulatory Visit (INDEPENDENT_AMBULATORY_CARE_PROVIDER_SITE_OTHER): Payer: Medicaid Other | Admitting: Nurse Practitioner

## 2015-08-30 ENCOUNTER — Telehealth: Payer: Self-pay

## 2015-08-30 VITALS — BP 101/65 | HR 68 | Temp 98.1°F | Ht 63.0 in | Wt 122.2 lb

## 2015-08-30 DIAGNOSIS — Z95810 Presence of automatic (implantable) cardiac defibrillator: Secondary | ICD-10-CM

## 2015-08-30 DIAGNOSIS — I5022 Chronic systolic (congestive) heart failure: Secondary | ICD-10-CM

## 2015-08-30 DIAGNOSIS — K219 Gastro-esophageal reflux disease without esophagitis: Secondary | ICD-10-CM

## 2015-08-30 DIAGNOSIS — D509 Iron deficiency anemia, unspecified: Secondary | ICD-10-CM

## 2015-08-30 NOTE — Progress Notes (Signed)
EPIC Encounter for ICM Monitoring  Patient Name: [name] is a 60 y.o. female Date: 08/30/2015 Primary Care Physican: [name], MD Primary Cardiologist: Aundra Dubin Electrophysiologist: Allred Dry Weight: unknown      Attempted patient call and unable to reach.  Transmission reviewed.   Thoracic impedance normal.   ICM trend: 08/30/2015     Follow-up plan: ICM clinic phone appointment on 10/03/2015.  Copy of ICM check sent to device physician.   Rosalene Billings, RN 08/30/2015 11:00 AM

## 2015-08-30 NOTE — Assessment & Plan Note (Signed)
To need anemia after extensive GI workup including colonoscopy, endoscopy, Givens capsule. Continues to see hematology. Is due for IV iron/IV blood within the next week. She is having worsening fatigue, no other anemia symptoms. Recommend she continue to follow with hematology for management of her anemia. This is likely going to be necessary especially given the fact that she is back on Coumadin. Return for follow-up in 3 months.

## 2015-08-30 NOTE — Patient Instructions (Signed)
1. Continue taking your acid blocker. 2. Continue seton and Dr. Whitney Muse at the hematology/cancer center to manage her anemia. 3. Monitor the spasm pains you're having in the mornings. 4. Return for follow-up in 3 months. If your symptoms continue we can consider other testing.

## 2015-08-30 NOTE — Progress Notes (Signed)
Referring Provider: Boykin Nearing, MD Primary Care Physician:  [name], MD Primary GI:  Dr. Oneida Alar  Chief Complaint  Patient presents with  . Anemia  . Gastroesophageal Reflux    HPI:   [name] is a 60 y.o. female who presents for follow-up on iron deficiency anemia and gastritis. She was last seen in our office 03/02/2015 at which point it was noted she has had an extensive GI evaluation in her anemia is likely multifactorial including gastritis, duodenitis, anticoagulation for A. fib, kidney disease. Sees hematology for iron and blood as needed recent acute on chronic anemia in the setting of hospital stay for sepsis and heart failure exacerbation. When she was last seen it was noted she was off her anticoagulation and scheduled see cardiology the following week to decide whether to restart anticoagulation. Remains on Protonix with good control of GERD symptoms. Recommend she continue her PPI, continues see hematology/oncology, CBC was ordered, recommend return for follow-up in 6 months.  She followed up with events heart failure clinic and was restarted on Coumadin. CBC showed hemoglobin 10.4. She is since had subsequent CBC draws which is shown her hemoglobin to be relatively stable in the 9.8-10.6 range. Saw hematology yesterday with a hemoglobin 9.8, called group home noting patient will need IV iron.  Today she states she's doing well. Recently had IV iron and was told she'll need blood as well. She is also back on coumadin. Has not seen any new overt bleeding. Denies chest pain, dyspnea, dizziness, weakness, syncope and near syncope. Has had increased fatigue recently. GERD symptoms well controlled on PPI. In the mornings she states her abdomen tightens up when she wakes, every morning, and also feels a "hard bulge" under her rib cage and she has to straighten up to get it to release; this is associated with pain. Is having regular bowel movements about twice a day  consistent with [place] 4, no straining. Denies any other upper or lower GI symptoms.  Past Medical History:  Diagnosis Date  . AICD (automatic cardioverter/defibrillator) present   . Anemia   . Anginal pain ([place])   . Anxiety 2015   . CAD (coronary artery disease)    a. LHC (04/2013): Chronically occluded LAD, moderate dz in large OM1 and severe dz and small posterior lateral vessel    . chronic systolic heart failure    a. ECHO (05/08/13): EF 20-25%, diff HK with akinesis of basal inferior wall, grade 1 DD, trivial MR, trivial TR b) RHC (05/06/13): RA 7, RV 30/2, PA 31/19 (24), PCWP 10, Fick CO/CI: 2.9/1.74, Thermo CO/Ci: 2.4/1.4, PA sat 53%  . Depression   . Diverticulosis   . DNR (do not resuscitate) 06/05/2015   Confirmed with patient on 06/05/2015  . ETOH abuse 1981   started abusing alcohol at age 74, quit   . GERD (gastroesophageal reflux disease) 2015   . History of blood transfusion    "related to losing blood from somewhere; never found out from where"  . Hyperlipidemia 2015  . Hypertension 2015  . Hypothyroidism   . Ischemic cardiomyopathy   . Mass of right breast on mammogram 07/14/2014  . Myocardial infarction ([place])    "I've had many" (09/13/2014)  . Pancreatitis 1980, 2015   in the setting of ETOH  . Persistent atrial fibrillation ([place]) 2015  . PONV (postoperative nausea and vomiting)   . Psoriasis 1968  . 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/2014  . Stroke (  [place]) 2011   left side weakness, minimal  . Suicidal overdose ([place]) 10/03/13   Overdoes on Beta Blockers & Xarelto ([place])    Past Surgical History:  Procedure Laterality Date  . ABDOMINAL EXPLORATION SURGERY  2007  . AGILE CAPSULE N/A 06/02/2014   Procedure: AGILE CAPSULE;  Surgeon: Danie Binder, MD;  Location: [facility];  Service: Endoscopy;  Laterality: N/A;  0800  . BIOPSY N/A 11/22/2013   Procedure: GASTRIC BIOPSIES;  Surgeon: Danie Binder, MD;  Location: [facility] ORS;   Service: Endoscopy;  Laterality: N/A;  . CARDIAC CATHETERIZATION    . COLON SURGERY    . COLONOSCOPY WITH PROPOFOL N/A 03/29/2014   SLF: 1. No definite source for anemia identified 2. 8 colon polyps removed 3. Severe diverticulosis noted the transverse colon  and right colon 4. Small internal hemorrohids.   . COLOSTOMY REVERSAL  ?2009  . COLOSTOMY TAKEDOWN  2007  . EP IMPLANTABLE DEVICE N/A 09/13/2014   Procedure: ICD Implant;  Surgeon: [name], MD;  Location: [place] CV LAB;  Service: Cardiovascular;  Laterality: N/A;  . EP IMPLANTABLE DEVICE  09/13/14   ICD St Jude, placed   . ESOPHAGOGASTRODUODENOSCOPY (EGD) WITH PROPOFOL N/A 11/22/2013   Dr. Oneida Alar: gastritis and duodenitis, negative H.pylori  . GIVENS CAPSULE STUDY N/A 06/10/2014   Procedure: GIVENS CAPSULE STUDY;  Surgeon: Danie Binder, MD;  [facility];  Service: Endoscopy;  Laterality: N/A;  0700  . ILEOSTOMY  ?2009  . ILEOSTOMY CLOSURE  ?2010  . LEFT AND RIGHT HEART CATHETERIZATION WITH CORONARY ANGIOGRAM N/A 05/10/2013   Procedure: LEFT AND RIGHT HEART CATHETERIZATION WITH CORONARY ANGIOGRAM;  Surgeon: [name], MD;  Location: [facility] CATH LAB;  Service: Cardiovascular;  Laterality: N/A;  . LEFT AND RIGHT HEART CATHETERIZATION WITH CORONARY ANGIOGRAM N/A 04/14/2014   Procedure: LEFT AND RIGHT HEART CATHETERIZATION WITH CORONARY ANGIOGRAM;  Surgeon: Larey Dresser, MD;  Location: [facility] CATH LAB;  Service: Cardiovascular;  Laterality: N/A;  . LEFT HEART CATHETERIZATION WITH CORONARY ANGIOGRAM N/A 09/03/2013   Procedure: LEFT HEART CATHETERIZATION WITH CORONARY ANGIOGRAM;  Surgeon: Sinclair Grooms, MD;  Location: [facility] CATH LAB;  Service: Cardiovascular;  Laterality: N/A;  . POLYPECTOMY N/A 03/29/2014   Procedure: POLYPECTOMY;  Surgeon: Danie Binder, MD;  Location: [facility] ORS;  Service: Endoscopy;  Laterality: N/A;  ascending colon, sigmoid colon x4, rectal x3    Current Outpatient Prescriptions  Medication Sig Dispense  Refill  . acetaminophen (TYLENOL) 325 MG tablet Take 650 mg by mouth every 6 (six) hours as needed for mild pain.    Marland Kitchen ammonium lactate (AMLACTIN) 12 % cream Apply 1 g topically daily as needed for dry skin (apply to elbows).     Marland Kitchen atorvastatin (LIPITOR) 80 MG tablet Take 1 tablet (80 mg total) by mouth daily. 30 tablet 6  . calcium carbonate (OS-CAL - DOSED IN MG OF ELEMENTAL CALCIUM) 1250 (500 Ca) MG tablet Take 1 tablet (500 mg of elemental calcium total) by mouth 3 (three) times daily with meals. 90 tablet 3  . carvedilol (COREG) 12.5 MG tablet Take 1 tablet (12.5 mg total) by mouth 2 (two) times daily. 60 tablet 6  . ezetimibe (ZETIA) 10 MG tablet Take 10 mg by mouth daily.    . hydrALAZINE (APRESOLINE) 10 MG tablet Take 10 mg by mouth 2 (two) times daily.    Marland Kitchen levothyroxine (SYNTHROID, LEVOTHROID) 25 MCG tablet Take 25 mcg by mouth daily before breakfast.     .  LORazepam (ATIVAN) 0.5 MG tablet Take 1 tablet (0.5 mg total) by mouth 2 (two) times daily. 60 tablet 2  . nitroGLYCERIN (NITROSTAT) 0.3 MG SL tablet Place 0.3 mg under the tongue every 5 (five) minutes as needed for chest pain (MAX 3 TABLETS). Reported on 07/18/2015    . ondansetron (ZOFRAN) 4 MG tablet Take 4 mg by mouth every 8 (eight) hours as needed for nausea or vomiting. Reported on 07/18/2015    . pantoprazole (PROTONIX) 40 MG tablet TAKE 1 TABLET BY MOUTH TWICE DAILY BEFORE A MEAL. 60 tablet 5  . potassium chloride SA (K-DUR,KLOR-CON) 20 MEQ tablet Take 1 tablet (20 mEq total) by mouth daily. 30 tablet 6  . sertraline (ZOLOFT) 50 MG tablet Take 100 mg by mouth daily.    Marland Kitchen torsemide (DEMADEX) 20 MG tablet Take 1 tablet (20 mg total) by mouth daily. 30 tablet 2  . warfarin (COUMADIN) 2.5 MG tablet Continue coumadin 1 1/2 tablets daily except 2 tablets on Mondays, Wednesdays and Fridays 55 tablet 1   No current facility-administered medications for this visit.     Allergies as of 08/30/2015 - Review Complete 08/30/2015    Allergen Reaction Noted  . Morphine and related Other (See Comments) 09/24/2013  . Penicillins Anaphylaxis and Rash 05/06/2013    Family History  Problem Relation Age of Onset  . CAD Father 69    MI  . CAD Mother 38    MI  . Alcohol abuse Mother   . Colon cancer Mother     in her 81s  . CAD Sister 1    Stent  . Diabetes Sister   . CAD Brother 55    MI  . Cancer Neg Hx     Social History   Social History  . Marital status: Married    Spouse name: N/A  . Number of children: 2  . Years of education: 10   Occupational History  . Unemployed     Social History Main Topics  . Smoking status: Current Some Day Smoker    Packs/day: 0.12    Years: 48.00    Types: Cigarettes  . Smokeless tobacco: Never Used  . Alcohol use No     Comment: last drink in [id number], former alcoholic  . Drug use: No  . Sexual activity: No   Other Topics Concern  . None   Social History Narrative   Lived previously with her spouse in an [place].     Son and daughter   Daughter in [place]   Son in [place]   Two grandchildren.       Presently in a group home [place], moved in 10/13/13.           Review of Systems: 10-point ROS negative except as per HPI.   Physical Exam: BP 101/65   Pulse 68   Temp 98.1 F (36.7 C) (Oral)   Ht 5\' 3"  (1.6 m)   Wt 122 lb 3.2 oz (55.4 kg)   LMP 01/22/1988 (Approximate)   BMI 21.65 kg/m  General:   Alert and oriented. Pleasant and cooperative. Well-nourished and well-developed.  Ears:  Normal auditory acuity. Cardiovascular:  S1, S2 present without murmurs appreciated. Extremities without clubbing or edema. Respiratory:  Clear to auscultation bilaterally. No wheezes, rales, or rhonchi. No distress.  Gastrointestinal:  +BS, soft, and non-distended. Mild epigastric TTP. No HSM noted. No guarding or rebound. No masses appreciated.  Rectal:  Deferred  Musculoskalatal:  Symmetrical without gross deformities. Neurologic:  Alert and oriented  x4;  grossly normal neurologically. Psych:  Alert and cooperative. Normal mood and affect. Heme/Lymph/Immune: No excessive bruising noted.    08/30/2015 10:18 AM   Disclaimer: This note was dictated with voice recognition software. Similar sounding words can inadvertently be transcribed and may not be corrected upon review.

## 2015-08-30 NOTE — Progress Notes (Signed)
cc'ed to pcp °

## 2015-08-30 NOTE — Assessment & Plan Note (Signed)
Her symptoms well controlled on PPI. Recommend she continue PPI, continue to monitor. She is having epigastric "balling up" and pain in the mornings. Possible esophageal spasm. We will have her monitor this until her follow-up and if it continues we can consider esophageal manometry in [facility] for possible esophageal spasm.

## 2015-08-30 NOTE — Telephone Encounter (Signed)
Remote ICM transmission received.  Attempted patient call to home and cell and left message for return call.

## 2015-08-31 ENCOUNTER — Encounter (HOSPITAL_BASED_OUTPATIENT_CLINIC_OR_DEPARTMENT_OTHER): Payer: Medicaid Other

## 2015-08-31 VITALS — BP 148/97 | HR 60 | Temp 97.8°F | Resp 18

## 2015-08-31 DIAGNOSIS — D509 Iron deficiency anemia, unspecified: Secondary | ICD-10-CM

## 2015-08-31 MED ORDER — SODIUM CHLORIDE 0.9 % IV SOLN
Freq: Once | INTRAVENOUS | Status: AC
  Administered 2015-08-31: 15:00:00 via INTRAVENOUS

## 2015-08-31 MED ORDER — SODIUM CHLORIDE 0.9 % IV SOLN
510.0000 mg | Freq: Once | INTRAVENOUS | Status: AC
  Administered 2015-08-31: 510 mg via INTRAVENOUS
  Filled 2015-08-31: qty 17

## 2015-08-31 NOTE — Patient Instructions (Signed)
[facility] Discharge Instructions  RECOMMENDATIONS MADE BY THE CONSULTANT AND ANY TEST RESULTS WILL BE SENT TO YOUR REFERRING PHYSICIAN.  IV iron today.    Thank you for choosing [facility] at [facility] to provide your oncology and hematology care.  To afford each patient quality time with our provider, please arrive at least 15 minutes before your scheduled appointment time.   Beginning January 23rd 2017 lab work for the Cancer Center will be done in the  Main lab at [facility] on 1st floor. If you have a lab appointment with the Cancer Center please come in thru the  Main Entrance and check in at the main information desk  You need to re-schedule your appointment should you arrive 10 or more minutes late.  We strive to give you quality time with our providers, and arriving late affects you and other patients whose appointments are after yours.  Also, if you no show three or more times for appointments you may be dismissed from the clinic at the providers discretion.     Again, thank you for choosing [facility].  Our hope is that these requests will decrease the amount of time that you wait before being seen by our physicians.       _____________________________________________________________  Should you have questions after your visit to [facility], please contact our office at [phone] between the hours of 8:30 a.m. and 4:30 p.m.  Voicemails left after 4:30 p.m. will not be returned until the following business day.  For prescription refill requests, have your pharmacy contact our office.         Resources For Cancer Patients and their Caregivers ? American Cancer Society: Can assist with transportation, wigs, general needs, runs Look Good Feel Better.        1-888-227-6333 ? Cancer Care: Provides financial assistance, online support groups, medication/co-pay assistance.  1-800-813-HOPE  (4673) ? [facility] Assists [place] cancer patients and their families through emotional , educational and financial support.  336-427-4357 ? [place] DSS Where to apply for food stamps, Medicaid and utility assistance. 336-342-1394 ? [facility]: Transportation to medical appointments. 336-347-2287 ? Social Security Administration: May apply for disability if have a Stage IV cancer. 336-342-7796 1-800-772-1213 ? [place] Aging, Disability and Transit Services: Assists with nutrition, care and transit needs. 336-349-2343  Cancer Center Support Programs: [email or web address]@ > Cancer Support Group  2nd Tuesday of the month 1pm-2pm, Journey Room  > Creative Journey  3rd Tuesday of the month 1130am-1pm, Journey Room  > Look Good Feel Better  1st Wednesday of the month 10am-12 noon, Journey Room (Call American Cancer Society to register 1-800-395-5775)    

## 2015-09-07 ENCOUNTER — Ambulatory Visit (INDEPENDENT_AMBULATORY_CARE_PROVIDER_SITE_OTHER): Payer: Medicaid Other | Admitting: *Deleted

## 2015-09-07 DIAGNOSIS — I480 Paroxysmal atrial fibrillation: Secondary | ICD-10-CM

## 2015-09-07 LAB — POCT INR: INR: 2.6

## 2015-09-07 MED ORDER — WARFARIN SODIUM 2.5 MG PO TABS: ORAL_TABLET | ORAL | 1 refills | Status: DC

## 2015-09-18 ENCOUNTER — Encounter (HOSPITAL_COMMUNITY): Payer: Self-pay

## 2015-09-18 ENCOUNTER — Ambulatory Visit (HOSPITAL_COMMUNITY)
Admission: RE | Admit: 2015-09-18 | Discharge: 2015-09-18 | Disposition: A | Discharge status: Home / Self Care | Payer: Medicaid Other | Source: Ambulatory Visit | Attending: Cardiology | Admitting: Cardiology

## 2015-09-18 VITALS — BP 102/70 | HR 62 | Wt 123.0 lb

## 2015-09-18 DIAGNOSIS — F17210 Nicotine dependence, cigarettes, uncomplicated: Secondary | ICD-10-CM | POA: Insufficient documentation

## 2015-09-18 DIAGNOSIS — Z66 Do not resuscitate: Secondary | ICD-10-CM | POA: Insufficient documentation

## 2015-09-18 DIAGNOSIS — I2510 Atherosclerotic heart disease of native coronary artery without angina pectoris: Secondary | ICD-10-CM | POA: Insufficient documentation

## 2015-09-18 DIAGNOSIS — Z7901 Long term (current) use of anticoagulants: Secondary | ICD-10-CM | POA: Insufficient documentation

## 2015-09-18 DIAGNOSIS — E039 Hypothyroidism, unspecified: Secondary | ICD-10-CM | POA: Diagnosis not present

## 2015-09-18 DIAGNOSIS — Z8673 Personal history of transient ischemic attack (TIA), and cerebral infarction without residual deficits: Secondary | ICD-10-CM | POA: Insufficient documentation

## 2015-09-18 DIAGNOSIS — I130 Hypertensive heart and chronic kidney disease with heart failure and stage 1 through stage 4 chronic kidney disease, or unspecified chronic kidney disease: Secondary | ICD-10-CM | POA: Diagnosis not present

## 2015-09-18 DIAGNOSIS — Z8249 Family history of ischemic heart disease and other diseases of the circulatory system: Secondary | ICD-10-CM | POA: Diagnosis not present

## 2015-09-18 DIAGNOSIS — N184 Chronic kidney disease, stage 4 (severe): Secondary | ICD-10-CM | POA: Insufficient documentation

## 2015-09-18 DIAGNOSIS — D509 Iron deficiency anemia, unspecified: Secondary | ICD-10-CM | POA: Insufficient documentation

## 2015-09-18 DIAGNOSIS — I2583 Coronary atherosclerosis due to lipid rich plaque: Secondary | ICD-10-CM

## 2015-09-18 DIAGNOSIS — Z79899 Other long term (current) drug therapy: Secondary | ICD-10-CM | POA: Diagnosis not present

## 2015-09-18 DIAGNOSIS — I252 Old myocardial infarction: Secondary | ICD-10-CM | POA: Diagnosis not present

## 2015-09-18 DIAGNOSIS — N179 Acute kidney failure, unspecified: Secondary | ICD-10-CM | POA: Diagnosis not present

## 2015-09-18 DIAGNOSIS — Z915 Personal history of self-harm: Secondary | ICD-10-CM | POA: Insufficient documentation

## 2015-09-18 DIAGNOSIS — Z95810 Presence of automatic (implantable) cardiac defibrillator: Secondary | ICD-10-CM | POA: Insufficient documentation

## 2015-09-18 DIAGNOSIS — I255 Ischemic cardiomyopathy: Secondary | ICD-10-CM | POA: Diagnosis not present

## 2015-09-18 DIAGNOSIS — E785 Hyperlipidemia, unspecified: Secondary | ICD-10-CM | POA: Diagnosis not present

## 2015-09-18 DIAGNOSIS — I447 Left bundle-branch block, unspecified: Secondary | ICD-10-CM | POA: Insufficient documentation

## 2015-09-18 DIAGNOSIS — I5022 Chronic systolic (congestive) heart failure: Secondary | ICD-10-CM | POA: Insufficient documentation

## 2015-09-18 DIAGNOSIS — I480 Paroxysmal atrial fibrillation: Secondary | ICD-10-CM | POA: Diagnosis not present

## 2015-09-18 DIAGNOSIS — K219 Gastro-esophageal reflux disease without esophagitis: Secondary | ICD-10-CM | POA: Diagnosis not present

## 2015-09-18 LAB — BASIC METABOLIC PANEL
ANION GAP: 6 (ref 5–15)
BUN: 29 mg/dL — ABNORMAL HIGH (ref 6–20)
CHLORIDE: 107 mmol/L (ref 101–111)
CO2: 28 mmol/L (ref 22–32)
CREATININE: 2.05 mg/dL — AB (ref 0.44–1.00)
Calcium: 9.4 mg/dL (ref 8.9–10.3)
GFR calc non Af Amer: 25 mL/min — ABNORMAL LOW (ref >60)
GFR, EST AFRICAN AMERICAN: 29 mL/min — AB (ref >60)
Glucose, Bld: 76 mg/dL (ref 65–99)
Potassium: 4.3 mmol/L (ref 3.5–5.1)
Sodium: 141 mmol/L (ref 135–145)

## 2015-09-18 LAB — CBC
HEMATOCRIT: 32.4 % — AB (ref 36.0–46.0)
HEMOGLOBIN: 10.1 g/dL — AB (ref 12.0–15.0)
MCH: 29.4 pg (ref 26.0–34.0)
MCHC: 31.2 g/dL (ref 30.0–36.0)
MCV: 94.5 fL (ref 78.0–100.0)
Platelets: 225 10*3/uL (ref 150–400)
RBC: 3.43 MIL/uL — AB (ref 3.87–5.11)
RDW: 13 % (ref 11.5–15.5)
WBC: 5.6 10*3/uL (ref 4.0–10.5)

## 2015-09-18 MED ORDER — LISINOPRIL 2.5 MG PO TABS: 2.5000 mg | ORAL_TABLET | Freq: Every day | ORAL | 6 refills | Status: DC

## 2015-09-18 NOTE — Patient Instructions (Signed)
Stop Hydralazine  Start Lisinopril 2.5 mg daily  Labs today  Labs in about 10 days  You have been referred to Dr Rayann Heman  Your physician recommends that you schedule a follow-up appointment in: 6 weeks

## 2015-09-18 NOTE — Progress Notes (Signed)
Patient ID: [name], female   DOB: 06-03-1955, 60 y.o.   MRN: [id number]    Advanced Heart Failure Clinic Note   PCP: N/A Cardiologist: Dr. Aundra Dubin  HPI: [name] is a 60 y/o female with a history of ETOH abuse, ICM, HTN, stroke and chronic systolic heart failure.  Admitted to [facility] 05/06/13 for pancreatitis. She had severely reduced systolic function with an EF of 20-25%. 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 Discharge weight was 140 pounds.   Admitted to [facility] 05/2013 with A fib RVR. Started on amiodarone 400 mg twice a day and Xarelto 20 mg daily. Chemically converted to NSR.  Admitted to [facility]  08/2013 with chest pain. Troponin was elevated. LHC was unchanged from April 2015 so she had CMRI that showed LAD territory was not viable, EF 28%. Plan to continue to treat medically.   Admitted 9/13 through 10/13/13 after attempted suicide. Overdosed on all HF medications. Discharged off carvedilol, lasix, lisinopril, xarelto and digoxin. Discharge weight 120 pounds. Discharged to [facility] which is a group home.  She has been doing much better since that time and is on sertraline.  Had colonoscopy on 03/29/14 with 8 polyps and diverticulosis. She developed increased dyspnea and had LHC/RHC in 3/16 showing mean RA 2, PA 45/21 (mean 34), PCWP mean 21, CI 2.77; LAD totally occluded with some collaterals from RCA, 40-50% OM1, 90% complex ostial PLV. No significant change on coronary angiography.  I thought that her dyspnea may have been due to worsened anemia.   In 8/16, she had St Jude ICD implanted.   Admitted to [facility] 1/24 through 02/24/2015. Treated for sepsis secondary to [facility]. On admit creatinine was  7.7. Entresto and spiro stopped. Due to anemia she was transfused 2UPRBCs. Blood loss thought to be from gastritis and duodenitis as well chronic anticoagulants. Eliquis was stopped, now on warfarin.  Discharged with nephrology and gastrology.  Discharge weight was 134 pounds.   She returns for HF followup.  Still living at group home.  Still smoking 1-2 cigarettes/day.  Weight stable.  Creatinine has trended down some but not back to her baseline. She walks some for exercise.  No exertional dyspnea or chest pain.  No orthopnea/PND.  No palpitations.    ECG: NSR, LBBB 146 msec.   ECHO 09/02/13 EF 25-30%  ECHO 03/2014: EF 25-30%  ECHO 02/15/2015: EF 25-30%.   Labs 05/15/13 K 3.7 Creatinine 0.97 Labs 06/02/13 K 3.9 Creatinine 1.0 Dig level 1.3 --> cut back dig to 0.0625 mg Labs 06/08/13 K 4.7 Creatinine 1.1 Dig level 0.4  Labs 06/19/13: K 4.1, creatinine 1.35 Labs 08/05/13: K 4.5, creatinine 1.17, AST 25, ALT 28, cholesterol 188, TG 185, HDL 41, LDL 110 Labs 08/17/13: K 4.9 Creatinine 2.0  Labs 09/06/13: Dig level 0.9 K 4.5 Creatinine 1.13 Labs 10/13/13 K 5.1 Creatinine 2.1 Labs 10/20/13 K 4.1 Creatinine 1.32 Labs 01/27/2014: Hgb 9.9, LDL 108  Labs 3/16: K 4.4, creatinine 1.08, hgb 8.6 Labs 9/16: K 3.6, creatinine 1.31, HCT 34.8, TnI 0.07 max.  Labs 10/16: K 4.5, creatinine 1.27 => 1.34, LDL 111, HDL 36 Labs 12/16: HCT 39.9 Labs 2/17: K 3.8, creatinine 5.09 Labs 3/17: LDL 75, HDL 34 Labs 6/17: K 4.4, creatinine 1.96 Labs 8/17: hgb 9.8   SH: Lives in a group home. Unemployed. Smoker . Does not drink  alcohol   FH: Father MI at the age of  34  ROS: All systems negative except as listed in HPI, PMH and Problem List.  Past Medical History:  Diagnosis Date  . AICD (automatic cardioverter/defibrillator) present   . Anemia   . Anginal pain ([place])   . Anxiety 2015   . CAD (coronary artery disease)    a. LHC (04/2013): Chronically occluded LAD, moderate dz in large OM1 and severe dz and small posterior lateral vessel    . chronic systolic heart failure    a. ECHO (05/08/13): EF 20-25%, diff HK with akinesis of basal inferior wall, grade 1 DD, trivial MR, trivial TR b) RHC (05/06/13): RA 7, RV 30/2,  PA 31/19 (24), PCWP 10, Fick CO/CI: 2.9/1.74, Thermo CO/Ci: 2.4/1.4, PA sat 53%  . Depression   . Diverticulosis   . DNR (do not resuscitate) 06/05/2015   Confirmed with patient on 06/05/2015  . ETOH abuse 1981   started abusing alcohol at age 17, quit   . GERD (gastroesophageal reflux disease) 2015   . History of blood transfusion    "related to losing blood from somewhere; never found out from where"  . Hyperlipidemia 2015  . Hypertension 2015  . Hypothyroidism   . Ischemic cardiomyopathy   . Mass of right breast on mammogram 07/14/2014  . Myocardial infarction ([place])    "I've had many" (09/13/2014)  . Pancreatitis 1980, 2015   in the setting of ETOH  . Persistent atrial fibrillation ([place]) 2015  . PONV (postoperative nausea and vomiting)   . Psoriasis 1968  . 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/2014  . Stroke [facility]) 2011   left side weakness, minimal  . Suicidal overdose ([place]) 10/03/13   Overdoes on Beta Blockers & Xarelto [facility])    Current Outpatient Prescriptions  Medication Sig Dispense Refill  . acetaminophen (TYLENOL) 325 MG tablet Take 650 mg by mouth every 6 (six) hours as needed for mild pain.    Marland Kitchen ammonium lactate (AMLACTIN) 12 % cream Apply 1 g topically daily as needed for dry skin (apply to elbows).     Marland Kitchen atorvastatin (LIPITOR) 80 MG tablet Take 1 tablet (80 mg total) by mouth daily. 30 tablet 6  . calcium carbonate (OS-CAL - DOSED IN MG OF ELEMENTAL CALCIUM) 1250 (500 Ca) MG tablet Take 1 tablet (500 mg of elemental calcium total) by mouth 3 (three) times daily with meals. 90 tablet 3  . carvedilol (COREG) 12.5 MG tablet Take 1 tablet (12.5 mg total) by mouth 2 (two) times daily. 60 tablet 6  . ezetimibe (ZETIA) 10 MG tablet Take 10 mg by mouth daily.    Marland Kitchen levothyroxine (SYNTHROID, LEVOTHROID) 25 MCG tablet Take 25 mcg by mouth daily before breakfast.     . LORazepam (ATIVAN) 0.5 MG tablet Take 1 tablet (0.5 mg total) by mouth 2  (two) times daily. 60 tablet 2  . nitroGLYCERIN (NITROSTAT) 0.3 MG SL tablet Place 0.3 mg under the tongue every 5 (five) minutes as needed for chest pain (MAX 3 TABLETS). Reported on 07/18/2015    . ondansetron (ZOFRAN) 4 MG tablet Take 4 mg by mouth every 8 (eight) hours as needed for nausea or vomiting. Reported on 07/18/2015    . pantoprazole (PROTONIX) 40 MG tablet TAKE 1 TABLET BY MOUTH TWICE DAILY BEFORE A MEAL. 60 tablet 5  . potassium chloride SA (K-DUR,KLOR-CON) 20 MEQ tablet Take 1 tablet (20 mEq total) by mouth daily. 30 tablet 6  . sertraline (ZOLOFT) 50 MG tablet Take 100 mg  by mouth daily.    Marland Kitchen torsemide (DEMADEX) 20 MG tablet Take 1 tablet (20 mg total) by mouth daily. 30 tablet 2  . warfarin (COUMADIN) 2.5 MG tablet Continue coumadin 1 1/2 tablets daily except 2 tablets on Mondays, Wednesdays and Fridays 55 tablet 1  . lisinopril (PRINIVIL,ZESTRIL) 2.5 MG tablet Take 1 tablet (2.5 mg total) by mouth daily. D/C order for Hydralazine 30 tablet 6   No current facility-administered medications for this encounter.    BP 102/70 (BP Location: Left Arm, Patient Position: Sitting, Cuff Size: Normal) Comment: recheck 102/70  Pulse 62   Wt 123 lb (55.8 kg)   LMP 01/22/1988 (Approximate)   SpO2 100%   BMI 21.79 kg/m   PHYSICAL EXAM: General: NAD HEENT: normal Neck: supple. JVP 5-6.  Carotids 2+ bilaterally; no bruits. No lymphadenopathy or thryomegaly appreciated. Cor: PMI normal. Regular rate & rhythm. No rubs, gallops or murmurs. Lungs: clear Abdomen: soft, nontender, nondistended. No hepatosplenomegaly. No bruits or masses. Good bowel sounds. Extremities: no cyanosis, clubbing, rash, edema Neuro: alert & orientedx3, cranial nerves grossly intact. Moves all 4 extremities w/o difficulty. Affect pleasant.  ASSESSMENT & PLAN: 1. Chronic systolic CHF: Primarily ischemic cardiomyopathy though ETOH may have played a role. EF 25-30% (08/2013).  Cardiac MRI in August 2015 showed no  viability in the LAD territory with EF 28%. Most recent ECHO (01/2015) EF 25-30%. NYHA class II symptoms.  Now s/p St Jude ICD.  Volume status stable, euvolemic on exam.  - Off entresto and spironolactone with CKD stage V.  Creatinine has decreased to 1.96. I will have her stop hydralazine and start lisinopril 2.5 mg daily.  BMET today and again in 10 days.  - Continue coreg 12.5 mg twice a day. - Continue current torsemide.  - Now has LBBB with QRS around 150 msec. Will send her back to Dr. Rayann Heman for evaluation for CRT upgrade.  2. CAD: LHC with chronically occluded LAD and severe disease in small PLV.  No change on 3/16 cath compared to prior. cMRI showed no viability in LAD territory.    - She is on warfarin so no ASA. - Continue atorvastatin and Zetia, good lipids in 3/17.  3. Current smoker: Encouraged again to stop smoking.  She is working on it.   4. Former Alcohol Abuse: Quit April 2015.  5. Atrial fibrillation: CHADSVasc Score 4 . Paroxysmal. NSR today. She is now on warfarin after severe AKI earlier this year.  Creatinine still above baseline.  6. Suicide Attempt:  9/15 suicide attempt via overdose.  She is now on sertraline and doing much better.  7. Hyperlipidemia: Lipids at goal on Zetia + atorvastatin.  8. Fe deficiency anemia: Check CBC today.  9. CKD Stage IV: Creatinine has improved over the last few months but still above her baseline.    [name] 09/18/2015 11:54 PM

## 2015-09-28 ENCOUNTER — Other Ambulatory Visit (HOSPITAL_COMMUNITY)
Admission: RE | Admit: 2015-09-28 | Discharge: 2015-09-28 | Disposition: A | Discharge status: Home / Self Care | Payer: Medicaid Other | Source: Ambulatory Visit | Attending: Cardiology | Admitting: Cardiology

## 2015-09-28 ENCOUNTER — Ambulatory Visit (INDEPENDENT_AMBULATORY_CARE_PROVIDER_SITE_OTHER): Payer: Medicaid Other | Admitting: *Deleted

## 2015-09-28 DIAGNOSIS — I5022 Chronic systolic (congestive) heart failure: Secondary | ICD-10-CM | POA: Diagnosis not present

## 2015-09-28 DIAGNOSIS — I480 Paroxysmal atrial fibrillation: Secondary | ICD-10-CM

## 2015-09-28 LAB — BASIC METABOLIC PANEL
Anion gap: 9 (ref 5–15)
BUN: 41 mg/dL — AB (ref 6–20)
CHLORIDE: 101 mmol/L (ref 101–111)
CO2: 27 mmol/L (ref 22–32)
CREATININE: 2.43 mg/dL — AB (ref 0.44–1.00)
Calcium: 9.3 mg/dL (ref 8.9–10.3)
GFR calc Af Amer: 24 mL/min — ABNORMAL LOW (ref >60)
GFR calc non Af Amer: 21 mL/min — ABNORMAL LOW (ref >60)
GLUCOSE: 103 mg/dL — AB (ref 65–99)
POTASSIUM: 4.6 mmol/L (ref 3.5–5.1)
Sodium: 137 mmol/L (ref 135–145)

## 2015-09-28 LAB — POCT INR: INR: 2.1

## 2015-09-28 MED ORDER — WARFARIN SODIUM 2.5 MG PO TABS: ORAL_TABLET | ORAL | 1 refills | Status: DC

## 2015-09-29 ENCOUNTER — Telehealth: Payer: Self-pay | Admitting: Internal Medicine

## 2015-09-29 ENCOUNTER — Telehealth (HOSPITAL_COMMUNITY): Payer: Self-pay | Admitting: *Deleted

## 2015-09-29 DIAGNOSIS — I5023 Acute on chronic systolic (congestive) heart failure: Secondary | ICD-10-CM

## 2015-09-29 MED ORDER — TORSEMIDE 20 MG PO TABS: 20.0000 mg | ORAL_TABLET | ORAL | 2 refills | Status: AC

## 2015-09-29 NOTE — Telephone Encounter (Signed)
Notes Recorded by Harvie Junior, [place] on 09/29/2015 at 2:05 PM EDT Patient aware. Pt added to lab schedule 9/15 medication updated in patients chart. ------  Notes Recorded by Larey Dresser, MD on 09/29/2015 at 12:12 AM EDT Decrease torsemide to every other day, repeat BMET 1 week.    Ref Range & Units 1d ago 11d ago 28mo ago   Sodium 135 - 145 mmol/L 137 141 141   Potassium 3.5 - 5.1 mmol/L 4.6 4.3 4.4   Chloride 101 - 111 mmol/L 101 107 107   CO2 22 - 32 mmol/L 27 28 30    Glucose, Bld 65 - 99 mg/dL 103  76 83   BUN 6 - 20 mg/dL 41  29  22    Creatinine, Ser 0.44 - 1.00 mg/dL 2.43  2.05  1.96    Calcium 8.9 - 10.3 mg/dL 9.3 9.4 9.6   GFR calc non Af Amer >60 mL/min 21  25  27     GFR calc Af Amer >60 mL/min 24  29CM  31CM

## 2015-09-29 NOTE — Telephone Encounter (Signed)
New Message  [name] from [facility]ts living Facility call requesting to speak to RN. [name] states she needs a faxed order to go to pts pharmacy to be able to get the med change for pts fluid pill Torsemide 20mg  1x daily for their facility charts. Please call back to discuss

## 2015-10-02 NOTE — Telephone Encounter (Signed)
Follow up    rn verbalized that she is returning call for rn about 09/29/15 message requesting medication order

## 2015-10-02 NOTE — Telephone Encounter (Signed)
Follow up       Calling to get order for fluid pill medication change.  Please fax order to [phone].

## 2015-10-02 NOTE — Telephone Encounter (Signed)
Order with new Torsemide dose, Torsemide 20 mg QOD faxed

## 2015-10-02 NOTE — Telephone Encounter (Signed)
Will forward to CHF clinic

## 2015-10-03 ENCOUNTER — Ambulatory Visit (INDEPENDENT_AMBULATORY_CARE_PROVIDER_SITE_OTHER): Payer: Medicaid Other

## 2015-10-03 DIAGNOSIS — I5022 Chronic systolic (congestive) heart failure: Secondary | ICD-10-CM

## 2015-10-03 DIAGNOSIS — Z95810 Presence of automatic (implantable) cardiac defibrillator: Secondary | ICD-10-CM | POA: Diagnosis not present

## 2015-10-03 NOTE — Progress Notes (Signed)
EPIC Encounter for ICM Monitoring  Patient Name: [name] is a 60 y.o. female Date: 10/03/2015 Primary Care Physican: [name], MD Primary Cardiologist: Aundra Dubin Electrophysiologist: Allred Dry Weight: unknown        Heart Failure questions reviewed, pt asymptomatic for fluid symptoms.  She reported she passed out on Monday but will discuss with Dr Rayann Heman at [place] office visit.  She reported Dr Aundra Dubin wants patient to have another lead inserted and will be discussing with Dr Rayann Heman.    Thoracic impedance normal   Recommendations: No changes.  Office appointment with Dr Rayann Heman 10/04/2015.     Follow-up plan: ICM clinic phone appointment on 11/08/2015.  Copy of ICM check sent to device physician.   ICM trend: 10/03/2015       Rosalene Billings, RN 10/03/2015 11:38 AM

## 2015-10-04 ENCOUNTER — Encounter: Payer: Self-pay | Admitting: Internal Medicine

## 2015-10-04 ENCOUNTER — Ambulatory Visit (INDEPENDENT_AMBULATORY_CARE_PROVIDER_SITE_OTHER): Payer: Medicaid Other | Admitting: Internal Medicine

## 2015-10-04 VITALS — BP 86/54 | HR 69 | Ht 63.0 in | Wt 124.2 lb

## 2015-10-04 DIAGNOSIS — I480 Paroxysmal atrial fibrillation: Secondary | ICD-10-CM | POA: Diagnosis not present

## 2015-10-04 DIAGNOSIS — I5022 Chronic systolic (congestive) heart failure: Secondary | ICD-10-CM | POA: Diagnosis not present

## 2015-10-04 DIAGNOSIS — Z95810 Presence of automatic (implantable) cardiac defibrillator: Secondary | ICD-10-CM

## 2015-10-04 DIAGNOSIS — I255 Ischemic cardiomyopathy: Secondary | ICD-10-CM | POA: Diagnosis not present

## 2015-10-04 DIAGNOSIS — I447 Left bundle-branch block, unspecified: Secondary | ICD-10-CM

## 2015-10-04 LAB — CUP PACEART INCLINIC DEVICE CHECK
Battery Remaining Longevity: 98.4
Brady Statistic RV Percent Paced: 0 %
HighPow Impedance: 83.25 Ohm
Implantable Lead Location: 753860
Lead Channel Impedance Value: 700 Ohm
Lead Channel Sensing Intrinsic Amplitude: 11.9 mV
Lead Channel Setting Pacing Amplitude: 2.5 V
Lead Channel Setting Pacing Pulse Width: 0.5 ms
Lead Channel Setting Sensing Sensitivity: 0.5 mV
MDC IDC LEAD IMPLANT DT: 20160823
MDC IDC MSMT LEADCHNL RV PACING THRESHOLD AMPLITUDE: 0.5 V
MDC IDC MSMT LEADCHNL RV PACING THRESHOLD AMPLITUDE: 0.5 V
MDC IDC MSMT LEADCHNL RV PACING THRESHOLD PULSEWIDTH: 0.5 ms
MDC IDC MSMT LEADCHNL RV PACING THRESHOLD PULSEWIDTH: 0.5 ms
MDC IDC SESS DTM: 20170913132925
Pulse Gen Serial Number: 7254873

## 2015-10-04 NOTE — Progress Notes (Signed)
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 female who presents today for routine electrophysiology followup.  Since her recent ICD Implantation, the patient reports doing very well.  She continues to struggle with CHF and renal failure.  Medicine titration is limited by hypotension.  She recently had syncope related to hypotension. Today, she denies symptoms of palpitations, chest pain, shortness of breath,  lower extremity edema, dizziness,  or ICD shocks.  The patient is otherwise without complaint today.   Past Medical History:  Diagnosis Date  . AICD (automatic cardioverter/defibrillator) present   . Anemia   . Anginal pain ([place])   . Anxiety 2015   . CAD (coronary artery disease)    a. LHC (04/2013): Chronically occluded LAD, moderate dz in large OM1 and severe dz and small posterior lateral vessel    . chronic systolic heart failure    a. ECHO (05/08/13): EF 20-25%, diff HK with akinesis of basal inferior wall, grade 1 DD, trivial MR, trivial TR b) RHC (05/06/13): RA 7, RV 30/2, PA 31/19 (24), PCWP 10, Fick CO/CI: 2.9/1.74, Thermo CO/Ci: 2.4/1.4, PA sat 53%  . Depression   . Diverticulosis   . DNR (do not resuscitate) 06/05/2015   Confirmed with patient on 06/05/2015  . ETOH abuse 1981   started abusing alcohol at age 12, quit   . GERD (gastroesophageal reflux disease) 2015   . History of blood transfusion    "related to losing blood from somewhere; never found out from where"  . Hyperlipidemia 2015  . Hypertension 2015  . Hypothyroidism   . Ischemic cardiomyopathy   . Mass of right breast on mammogram 07/14/2014  . Myocardial infarction ([place])    "I've had many" (09/13/2014)  . Pancreatitis 1980, 2015   in the setting of ETOH  . Persistent atrial fibrillation ([place]) 2015  . PONV (postoperative nausea and vomiting)   . Psoriasis 1968  . 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/2014  . Stroke [facility]) 2011     left side weakness, minimal  . Suicidal overdose ([place]) 10/03/13   Overdoes on Beta Blockers & Xarelto ([place])   Past Surgical History:  Procedure Laterality Date  . ABDOMINAL EXPLORATION SURGERY  2007  . AGILE CAPSULE N/A 06/02/2014   Procedure: AGILE CAPSULE;  Surgeon: Danie Binder, MD;  Location: [facility] ENDO SUITE;  Service: Endoscopy;  Laterality: N/A;  0800  . BIOPSY N/A 11/22/2013   Procedure: GASTRIC BIOPSIES;  Surgeon: Danie Binder, MD;  Location: [facility] ORS;  Service: Endoscopy;  Laterality: N/A;  . CARDIAC CATHETERIZATION    . COLON SURGERY    . COLONOSCOPY WITH PROPOFOL N/A 03/29/2014   SLF: 1. No definite source for anemia identified 2. 8 colon polyps removed 3. Severe diverticulosis noted the transverse colon  and right colon 4. Small internal hemorrohids.   . COLOSTOMY REVERSAL  ?2009  . COLOSTOMY TAKEDOWN  2007  . EP IMPLANTABLE DEVICE N/A 09/13/2014   Procedure: ICD Implant;  Surgeon: [name], MD;  Location: [place] CV LAB;  Service: Cardiovascular;  Laterality: N/A;  . EP IMPLANTABLE DEVICE  09/13/14   ICD St Jude, placed   . ESOPHAGOGASTRODUODENOSCOPY (EGD) WITH PROPOFOL N/A 11/22/2013   Dr. Oneida Alar: gastritis and duodenitis, negative H.pylori  . GIVENS CAPSULE STUDY N/A 06/10/2014   Procedure: GIVENS CAPSULE STUDY;  Surgeon: Danie Binder, MD;  [facility];  Service: Endoscopy;  Laterality: N/A;  0700  . ILEOSTOMY  ?2009  . ILEOSTOMY CLOSURE  ?2010  . LEFT AND RIGHT HEART CATHETERIZATION WITH CORONARY ANGIOGRAM N/A 05/10/2013   Procedure: LEFT AND RIGHT HEART CATHETERIZATION WITH CORONARY ANGIOGRAM;  Surgeon: [name], MD;  Location: [facility] CATH LAB;  Service: Cardiovascular;  Laterality: N/A;  . LEFT AND RIGHT HEART CATHETERIZATION WITH CORONARY ANGIOGRAM N/A 04/14/2014   Procedure: LEFT AND RIGHT HEART CATHETERIZATION WITH CORONARY ANGIOGRAM;  Surgeon: Larey Dresser, MD;  Location[facility] CATH LAB;  Service: Cardiovascular;  Laterality: N/A;  . LEFT  HEART CATHETERIZATION WITH CORONARY ANGIOGRAM N/A 09/03/2013   Procedure: LEFT HEART CATHETERIZATION WITH CORONARY ANGIOGRAM;  Surgeon: Sinclair Grooms, MD;  Location: [facility] CATH LAB;  Service: Cardiovascular;  Laterality: N/A;  . POLYPECTOMY N/A 03/29/2014   Procedure: POLYPECTOMY;  Surgeon: Danie Binder, MD;  Location: [facility] ORS;  Service: Endoscopy;  Laterality: N/A;  ascending colon, sigmoid colon x4, rectal x3    ROS- all systems are reviewed and negative except as per HPI above  Current Outpatient Prescriptions  Medication Sig Dispense Refill  . acetaminophen (TYLENOL) 325 MG tablet Take 650 mg by mouth every 6 (six) hours as needed for mild pain.    Marland Kitchen ammonium lactate (AMLACTIN) 12 % cream Apply 1 g topically daily as needed for dry skin (apply to elbows).     Marland Kitchen atorvastatin (LIPITOR) 80 MG tablet Take 1 tablet (80 mg total) by mouth daily. 30 tablet 6  . calcium carbonate (OS-CAL - DOSED IN MG OF ELEMENTAL CALCIUM) 1250 (500 Ca) MG tablet Take 1 tablet (500 mg of elemental calcium total) by mouth 3 (three) times daily with meals. 90 tablet 3  . carvedilol (COREG) 12.5 MG tablet Take 1 tablet (12.5 mg total) by mouth 2 (two) times daily. 60 tablet 6  . ezetimibe (ZETIA) 10 MG tablet Take 10 mg by mouth daily.    Marland Kitchen levothyroxine (SYNTHROID, LEVOTHROID) 25 MCG tablet Take 25 mcg by mouth daily before breakfast.     . lisinopril (PRINIVIL,ZESTRIL) 2.5 MG tablet Take 1 tablet (2.5 mg total) by mouth daily. D/C order for Hydralazine 30 tablet 6  . LORazepam (ATIVAN) 0.5 MG tablet Take 1 tablet (0.5 mg total) by mouth 2 (two) times daily. 60 tablet 2  . nitroGLYCERIN (NITROSTAT) 0.3 MG SL tablet Place 0.3 mg under the tongue every 5 (five) minutes as needed for chest pain (MAX 3 TABLETS). Reported on 07/18/2015    . ondansetron (ZOFRAN) 4 MG tablet Take 4 mg by mouth every 8 (eight) hours as needed for nausea or vomiting. Reported on 07/18/2015    . pantoprazole (PROTONIX) 40 MG tablet TAKE 1 TABLET  BY MOUTH TWICE DAILY BEFORE A MEAL. 60 tablet 5  . potassium chloride SA (K-DUR,KLOR-CON) 20 MEQ tablet Take 1 tablet (20 mEq total) by mouth daily. 30 tablet 6  . sertraline (ZOLOFT) 50 MG tablet Take 100 mg by mouth daily.    Marland Kitchen torsemide (DEMADEX) 20 MG tablet Take 1 tablet (20 mg total) by mouth every other day. 15 tablet 2  . warfarin (COUMADIN) 2.5 MG tablet Continue coumadin 1 1/2 tablets daily except 2 tablets on Mondays, Wednesdays and Fridays 55 tablet 1   No current facility-administered medications for this visit.     Physical Exam: Vitals:   10/04/15 0925  BP: (!) 86/54  Pulse: 69  Weight: 124 lb 3.2 oz (56.3 kg)  Height: 5\' 3"  (1.6 m)    GEN- The patient is well  appearing, alert and oriented x 3 today.   Head- normocephalic, atraumatic Eyes-  Sclera clear, conjunctiva pink Ears- hearing intact Oropharynx- clear Lungs- Clear to ausculation bilaterally, normal work of breathing Chest- ICD pocket is well healed Heart- Regular rate and rhythm, no murmurs, rubs or gallops, PMI not laterally displaced GI- soft, NT, ND, + BS Extremities- no clubbing, cyanosis, or edema  ICD interrogation- reviewed in detail today,  See PACEART report  Assessment and Plan:  1.  Chronic systolic dysfunction euvolemic today however she struggles with CHF and renal failure She now has LBBB with QRS > 150 msec.  At time of ICD implant, she had QRS < 130 and thus LV lead was not placed.  I agree with Dr Aundra Dubin that given her worsening CHF and widening of her QRS (currently LBBB with QRS > 150 msec) that upgrade to CRT is warranted. Risks, benefits, alternatives to ICD upgrade to CRT-D were discussed in detail with the patient today. The patient  understands that the risks include but are not limited to bleeding, infection, pneumothorax, perforation, tamponade, vascular damage, renal failure, MI, stroke, death, inappropriate shocks, and lead dislodgement and wishes to proceed.  We will therefore  schedule device implantation at the next available time.  I have instructed her to hold coumadin for 2 days prior to the procedure.   [name] MD, [facility] 10/04/2015 10:15 AM

## 2015-10-04 NOTE — Patient Instructions (Addendum)
Medication:  Your physician recommends that you continue on your current medications as directed. Please refer to the Current Medication list given to you today.   Labwork:  BMET/CBC 10/18/15 [facility] per order   Testing/Procedures:  Your physician has recommended that you have a ICD upgrade. A pacemaker is a small device that is placed under the skin of your chest or abdomen to help control abnormal heart rhythms. This device uses electrical pulses to prompt the heart to beat at a normal rate. Pacemakers are used to treat heart rhythms that are too slow. Wire (leads) are attached to the pacemaker that goes into the chambers of you heart. This is done in the hospital and usually requires and overnight stay. Please see the instruction sheet given to you today for more information------ 10/30/15   Special Instructions:    Please report to the [facility] at  5:30 AM  Nothing to eat or drink after midnight prior to procedure  Do not take any medication prior to procedure  Hold Coumadin 2 days prior to procedure. Last dose 10/27/15  Plan 1 night stay   Follow-up:   Your physician recommends that you schedule a follow-up appointment 10-14 days from 10/30/15 in [facility] and 3 months from 10/30/15 with Dr. Rayann Heman.

## 2015-10-06 ENCOUNTER — Other Ambulatory Visit (HOSPITAL_COMMUNITY): Payer: Self-pay | Admitting: *Deleted

## 2015-10-06 ENCOUNTER — Other Ambulatory Visit (HOSPITAL_COMMUNITY): Payer: Self-pay

## 2015-10-06 DIAGNOSIS — D509 Iron deficiency anemia, unspecified: Secondary | ICD-10-CM

## 2015-10-06 NOTE — Progress Notes (Signed)
[name], MD [place] [address]  Iron deficiency Anemia R breast cyst  CURRENT THERAPY: S/P IV Feraheme 510 mg on 06/24/2014 and 2 unit PRBC transfusion on 06/17/2014.  INTERVAL HISTORY: [name] 60 y.o. female returns for followup of iron deficiency anemia with Colonscopy on 03/29/2014 being negative and EGD on 11/22/2013 demonstrating duodenitis and gastritis. She also has a history of low B12.  [name] is accompanied by her transportation helper. I personally reviewed and went over laboratory studies with the patient, as well as low blood pressure today.  She has seen a cardiologist about her low blood pressure. She did black out because of low blood pressure last week. She was told to take it easy and has been scheduled for BiV upgrade on 10/30/2015.  She is seen by Dr. Lowanda Foster for CKD.  She eats well. She is feeling well and is without complaint. Denies pagophagia.  Denies Pica.   Past Medical History:  Diagnosis Date  . AICD (automatic cardioverter/defibrillator) present   . Anemia   . Anginal pain ([place])   . Anxiety 2015   . CAD (coronary artery disease)    a. LHC (04/2013): Chronically occluded LAD, moderate dz in large OM1 and severe dz and small posterior lateral vessel    . chronic systolic heart failure    a. ECHO (05/08/13): EF 20-25%, diff HK with akinesis of basal inferior wall, grade 1 DD, trivial MR, trivial TR b) RHC (05/06/13): RA 7, RV 30/2, PA 31/19 (24), PCWP 10, Fick CO/CI: 2.9/1.74, Thermo CO/Ci: 2.4/1.4, PA sat 53%  . Depression   . Diverticulosis   . DNR (do not resuscitate) 06/05/2015   Confirmed with patient on 06/05/2015  . ETOH abuse 1981   started abusing alcohol at age 57, quit   . GERD (gastroesophageal reflux disease) 2015   . History of blood transfusion    "related to losing blood from somewhere; never found out from where"  . Hyperlipidemia 2015  . Hypertension 2015  . Hypothyroidism   . Ischemic  cardiomyopathy   . Mass of right breast on mammogram 07/14/2014  . Myocardial infarction ([place])    "I've had many" (09/13/2014)  . Pancreatitis 1980, 2015   in the setting of ETOH  . Persistent atrial fibrillation ([place]) 2015  . PONV (postoperative nausea and vomiting)   . Psoriasis 1968  . 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/2014  . Stroke [facility]) 2011   left side weakness, minimal  . Suicidal overdose ([place]) 10/03/13   Overdoes on Beta Blockers & Xarelto ()    has Hypertension; GERD (gastroesophageal reflux disease); H/O alcohol abuse; Tobacco abuse; Family history of premature CAD; Acute on chronic systolic (congestive) heart failure ([place]); H/O PAF; Cardiomyopathy, ischemic- EF 25-30% 2D 09/02/13; CAD- total LAD- residual OM2 disease; Anxiety state; Suicide attempt Sept 2015; Psoriasis; CKD (chronic kidney disease) stage 3, GFR 30-59 ml/min; FH: colon cancer; Cervical cancer screening; Hypothyroidism; Gastritis and gastroduodenitis; Iron deficiency anemia; Acute on chronic systolic CHF (congestive heart failure) (HCC); CHF (congestive heart failure) ([place]); CHF exacerbation ([place]); Mass of right breast on mammogram; Ischemic cardiomyopathy; S/P ICD (internal cardiac defibrillator) procedure, St. Jude device, 09/13/14; NSTEMI (non-ST elevated myocardial infarction) ([place]); Sepsis ([place]); AKI (acute kidney injury) ([place]); Acute renal failure (ARF) ([place]); Chronic anticoagulation-Eliquis; Palliative care encounter; E. coli UTI; Pleuritic chest pain; Arm edema; Low oxygen saturation; CKD (chronic kidney disease) stage 5, GFR less  than 15 ml/min ([place]); Orthostatic hypotension; Paroxysmal a-fib ([place]); Chronic systolic heart failure ([place]); and DNR (do not resuscitate) on her problem list.     is allergic to morphine and related and penicillins.  Current Outpatient Prescriptions on File Prior to Visit  Medication Sig Dispense Refill  . acetaminophen (TYLENOL) 325 MG  tablet Take 650 mg by mouth every 6 (six) hours as needed for mild pain.    Marland Kitchen ammonium lactate (AMLACTIN) 12 % cream Apply 1 g topically daily as needed for dry skin (apply to elbows).     Marland Kitchen atorvastatin (LIPITOR) 80 MG tablet Take 1 tablet (80 mg total) by mouth daily. 30 tablet 6  . calcium carbonate (OS-CAL - DOSED IN MG OF ELEMENTAL CALCIUM) 1250 (500 Ca) MG tablet Take 1 tablet (500 mg of elemental calcium total) by mouth 3 (three) times daily with meals. 90 tablet 3  . carvedilol (COREG) 12.5 MG tablet Take 1 tablet (12.5 mg total) by mouth 2 (two) times daily. 60 tablet 6  . ezetimibe (ZETIA) 10 MG tablet Take 10 mg by mouth daily.    Marland Kitchen levothyroxine (SYNTHROID, LEVOTHROID) 25 MCG tablet Take 25 mcg by mouth daily before breakfast.     . lisinopril (PRINIVIL,ZESTRIL) 2.5 MG tablet Take 1 tablet (2.5 mg total) by mouth daily. D/C order for Hydralazine 30 tablet 6  . LORazepam (ATIVAN) 0.5 MG tablet Take 1 tablet (0.5 mg total) by mouth 2 (two) times daily. 60 tablet 2  . nitroGLYCERIN (NITROSTAT) 0.3 MG SL tablet Place 0.3 mg under the tongue every 5 (five) minutes as needed for chest pain (MAX 3 TABLETS). Reported on 07/18/2015    . ondansetron (ZOFRAN) 4 MG tablet Take 4 mg by mouth every 8 (eight) hours as needed for nausea or vomiting. Reported on 07/18/2015    . pantoprazole (PROTONIX) 40 MG tablet TAKE 1 TABLET BY MOUTH TWICE DAILY BEFORE A MEAL. 60 tablet 5  . potassium chloride SA (K-DUR,KLOR-CON) 20 MEQ tablet Take 1 tablet (20 mEq total) by mouth daily. 30 tablet 6  . sertraline (ZOLOFT) 50 MG tablet Take 100 mg by mouth daily.    Marland Kitchen torsemide (DEMADEX) 20 MG tablet Take 1 tablet (20 mg total) by mouth every other day. 15 tablet 2  . warfarin (COUMADIN) 2.5 MG tablet Continue coumadin 1 1/2 tablets daily except 2 tablets on Mondays, Wednesdays and Fridays 55 tablet 1   No current facility-administered medications on file prior to visit.     Past Surgical History:  Procedure  Laterality Date  . ABDOMINAL EXPLORATION SURGERY  2007  . AGILE CAPSULE N/A 06/02/2014   Procedure: AGILE CAPSULE;  Surgeon: Danie Binder, MD;  [facility];  Service: Endoscopy;  Laterality: N/A;  0800  . BIOPSY N/A 11/22/2013   Procedure: GASTRIC BIOPSIES;  Surgeon: Danie Binder, MD;  Location: [facility] ORS;  Service: Endoscopy;  Laterality: N/A;  . CARDIAC CATHETERIZATION    . COLON SURGERY    . COLONOSCOPY WITH PROPOFOL N/A 03/29/2014   SLF: 1. No definite source for anemia identified 2. 8 colon polyps removed 3. Severe diverticulosis noted the transverse colon  and right colon 4. Small internal hemorrohids.   . COLOSTOMY REVERSAL  ?2009  . COLOSTOMY TAKEDOWN  2007  . EP IMPLANTABLE DEVICE N/A 09/13/2014   Procedure: ICD Implant;  Surgeon: [name], MD;  Location: [facility] CV LAB;  Service: Cardiovascular;  Laterality: N/A;  . EP IMPLANTABLE DEVICE  09/13/14   ICD  St Jude, placed   . ESOPHAGOGASTRODUODENOSCOPY (EGD) WITH PROPOFOL N/A 11/22/2013   Dr. Oneida Alar: gastritis and duodenitis, negative H.pylori  . GIVENS CAPSULE STUDY N/A 06/10/2014   Procedure: GIVENS CAPSULE STUDY;  Surgeon: Danie Binder, MD;  [facility];  Service: Endoscopy;  Laterality: N/A;  0700  . ILEOSTOMY  ?2009  . ILEOSTOMY CLOSURE  ?2010  . LEFT AND RIGHT HEART CATHETERIZATION WITH CORONARY ANGIOGRAM N/A 05/10/2013   Procedure: LEFT AND RIGHT HEART CATHETERIZATION WITH CORONARY ANGIOGRAM;  Surgeon: [name], MD;  Location: [facility] CATH LAB;  Service: Cardiovascular;  Laterality: N/A;  . LEFT AND RIGHT HEART CATHETERIZATION WITH CORONARY ANGIOGRAM N/A 04/14/2014   Procedure: LEFT AND RIGHT HEART CATHETERIZATION WITH CORONARY ANGIOGRAM;  Surgeon: Larey Dresser, MD;  Location: [facility] CATH LAB;  Service: Cardiovascular;  Laterality: N/A;  . LEFT HEART CATHETERIZATION WITH CORONARY ANGIOGRAM N/A 09/03/2013   Procedure: LEFT HEART CATHETERIZATION WITH CORONARY ANGIOGRAM;  Surgeon: Sinclair Grooms, MD;   Location: [facility] CATH LAB;  Service: Cardiovascular;  Laterality: N/A;  . POLYPECTOMY N/A 03/29/2014   Procedure: POLYPECTOMY;  Surgeon: Danie Binder, MD;  Location: [facility] ORS;  Service: Endoscopy;  Laterality: N/A;  ascending colon, sigmoid colon x4, rectal x3    Denies any headaches, dizziness, double vision, fevers, chills, night sweats, nausea, vomiting, diarrhea, constipation, chest pain, heart palpitations, shortness of breath, blood in stool, black tarry stool, urinary pain, urinary burning, urinary frequency, hematuria. 14 point review of systems was performed and is negative except as detailed under history of present illness and above    PHYSICAL EXAMINATION  ECOG PERFORMANCE STATUS: 0 - Asymptomatic  Vitals:   10/09/15 1032  BP: (!) 78/49  Pulse: 77  Resp: 16  Temp: 98.1 F (36.7 C)    GENERAL:alert, no distress, well nourished, well developed, comfortable, cooperative and smiling SKIN: skin color, texture, turgor are normal, no rashes or significant lesions HEAD: Normocephalic, No masses, lesions, tenderness or abnormalities EYES: normal, PERRLA, EOMI, Conjunctiva are pink and non-injected EARS: External ears normal OROPHARYNX:lips, buccal mucosa, and tongue normal and mucous membranes are moist  NECK: supple, no adenopathy, thyroid normal size, non-tender, without nodularity, no stridor, non-tender, trachea midline LYMPH:  no palpable lymphadenopathy, no hepatosplenomegaly BREAST:left breast normal without mass, skin or nipple changes or axillary nodes, abnormal mass palpable in the 4 o clock position measuring about 1.5 cm in size.  It is mobile. LUNGS: clear to auscultation and percussion HEART: regular rate & rhythm, no murmurs, no gallops, S1 normal and S2 normal ICD in L chest wall ABDOMEN:abdomen soft, non-tender and normal bowel sounds BACK: Back symmetric, no curvature., No CVA tenderness EXTREMITIES:less then 2 second capillary refill, no joint deformities,  effusion, or inflammation, no edema, no skin discoloration, no clubbing, no cyanosis  NEURO: alert & oriented x 3 with fluent speech, no focal motor/sensory deficits, gait normal   LABORATORY DATA: I have reviewed the data as listed. CBC    Component Value Date/Time   WBC 4.6 10/09/2015 0953   RBC 3.29 (L) 10/09/2015 0953   HGB 10.1 (L) 10/09/2015 0953   HCT 30.2 (L) 10/09/2015 0953   PLT 188 10/09/2015 0953   MCV 91.8 10/09/2015 0953   MCH 30.7 10/09/2015 0953   MCHC 33.4 10/09/2015 0953   RDW 12.5 10/09/2015 0953   LYMPHSABS 1.2 10/09/2015 0953   MONOABS 0.4 10/09/2015 0953   EOSABS 0.1 10/09/2015 0953   BASOSABS 0.0 10/09/2015 [id number]  Chemistry      Component Value Date/Time   NA 137 09/28/2015 1340   K 4.6 09/28/2015 1340   CL 101 09/28/2015 1340   CO2 27 09/28/2015 1340   BUN 41 (H) 09/28/2015 1340   CREATININE 2.43 (H) 09/28/2015 1340      Component Value Date/Time   CALCIUM 9.3 09/28/2015 1340   ALKPHOS 66 03/24/2015 1048   AST 45 (H) 03/24/2015 1048   ALT 31 03/24/2015 1048   BILITOT 0.6 03/24/2015 1048       RADIOGRAPHIC STUDIES: I have personally reviewed the radiological images as listed and agreed with the findings in the report. Study Result   CLINICAL DATA:  Left upper extremity edema, redness, and pain for 1 week.  EXAM: LEFT UPPER EXTREMITY VENOUS DOPPLER ULTRASOUND  TECHNIQUE: Gray-scale sonography with graded compression, as well as color Doppler and duplex ultrasound were performed to evaluate the upper extremity deep venous system from the level of the subclavian vein and including the jugular, axillary, basilic, radial, ulnar and upper cephalic vein. Spectral Doppler was utilized to evaluate flow at rest and with distal augmentation maneuvers.  COMPARISON:  None.  FINDINGS: Contralateral Subclavian Vein: Respiratory phasicity is normal and symmetric with the symptomatic side. No evidence of thrombus.  Normal compressibility.  Internal Jugular Vein: No evidence of thrombus. Normal compressibility, respiratory phasicity and response to augmentation.  Subclavian Vein: No evidence of thrombus. Normal compressibility, respiratory phasicity and response to augmentation.  Axillary Vein: No evidence of thrombus. Normal compressibility, respiratory phasicity and response to augmentation.  Cephalic Vein: No evidence of thrombus. Normal compressibility, respiratory phasicity and response to augmentation.  Basilic Vein: No evidence of thrombus. Normal compressibility, respiratory phasicity and response to augmentation.  Brachial Veins: No evidence of thrombus. Normal compressibility, respiratory phasicity and response to augmentation.  Radial Veins: No evidence of thrombus. Normal compressibility, respiratory phasicity and response to augmentation.  Ulnar Veins: No evidence of thrombus. Normal compressibility, respiratory phasicity and response to augmentation.  Venous Reflux:  None visualized.  Other Findings:  Left upper extremity subcutaneous edema noted.  IMPRESSION: No evidence of deep venous thrombosis.   Electronically Signed   By: Jerilynn Mages.  Shick M.D.   On: 02/23/2015 15:12    No results found.   ASSESSMENT AND PLAN:  Severe iron deficiency anemia EGD 06/13/2014 with gastritis and duodenitis in setting of anticoagulation with fresh blood in stomach. History of suicide attempt Right breast cyst Chronic systolic dysfunction/ischemic cardiomyopathy/atrial fibrillation Single-chamber ICD placement, Eliquis, 09/13/2014 CKD  THERAPY PLAN:  She is currently doing fairly well. Hgb is 10.1 gm/dl. We will keep her apprised of her iron levels when available. We will replace her iron as needed with goal ferritin being greater than or equal to 100 ng/ml.  Labs reviewed.  BP today is 78/49. She has been scheduled for BiV upgrade on 10/30/2015.  She will return for  follow up in 4 months and will continue with standing labs every 6 weeks.  Orders Placed This Encounter  Procedures  . CBC with Differential    Standing Status:   Standing    Number of Occurrences:   50    Standing Expiration Date:   10/08/2016  . Ferritin    Standing Status:   Standing    Number of Occurrences:   50    Standing Expiration Date:   10/08/2016    All questions were answered. The patient knows to call the clinic with any problems, questions or concerns. We can certainly  see the patient much sooner if necessary.   This document serves as a record of services personally performed by Ancil Linsey, MD. It was created on her behalf by Arlyce Harman, a trained medical scribe. The creation of this record is based on the scribe's personal observations and the provider's statements to them. This document has been checked and approved by the attending provider.  I have reviewed the above documentation for accuracy and completeness, and I agree with the above.  This note is electronically signed.  Kelby Fam. Whitney Muse, MD

## 2015-10-09 ENCOUNTER — Encounter (HOSPITAL_BASED_OUTPATIENT_CLINIC_OR_DEPARTMENT_OTHER): Payer: Medicaid Other | Admitting: Hematology & Oncology

## 2015-10-09 ENCOUNTER — Encounter (HOSPITAL_COMMUNITY): Payer: Self-pay | Admitting: Hematology & Oncology

## 2015-10-09 ENCOUNTER — Encounter (HOSPITAL_COMMUNITY): Payer: Medicaid Other | Attending: Hematology & Oncology

## 2015-10-09 ENCOUNTER — Ambulatory Visit (HOSPITAL_COMMUNITY): Payer: Self-pay | Admitting: Oncology

## 2015-10-09 VITALS — BP 78/49 | HR 77 | Temp 98.1°F | Resp 16 | Wt 123.0 lb

## 2015-10-09 DIAGNOSIS — I129 Hypertensive chronic kidney disease with stage 1 through stage 4 chronic kidney disease, or unspecified chronic kidney disease: Secondary | ICD-10-CM | POA: Diagnosis not present

## 2015-10-09 DIAGNOSIS — E785 Hyperlipidemia, unspecified: Secondary | ICD-10-CM | POA: Diagnosis not present

## 2015-10-09 DIAGNOSIS — I481 Persistent atrial fibrillation: Secondary | ICD-10-CM | POA: Diagnosis not present

## 2015-10-09 DIAGNOSIS — I255 Ischemic cardiomyopathy: Secondary | ICD-10-CM | POA: Diagnosis not present

## 2015-10-09 DIAGNOSIS — K2980 Duodenitis without bleeding: Secondary | ICD-10-CM | POA: Diagnosis not present

## 2015-10-09 DIAGNOSIS — I252 Old myocardial infarction: Secondary | ICD-10-CM | POA: Insufficient documentation

## 2015-10-09 DIAGNOSIS — L409 Psoriasis, unspecified: Secondary | ICD-10-CM | POA: Diagnosis not present

## 2015-10-09 DIAGNOSIS — I5022 Chronic systolic (congestive) heart failure: Secondary | ICD-10-CM | POA: Diagnosis not present

## 2015-10-09 DIAGNOSIS — N183 Chronic kidney disease, stage 3 (moderate): Secondary | ICD-10-CM | POA: Diagnosis not present

## 2015-10-09 DIAGNOSIS — D509 Iron deficiency anemia, unspecified: Secondary | ICD-10-CM | POA: Diagnosis not present

## 2015-10-09 DIAGNOSIS — Z7901 Long term (current) use of anticoagulants: Secondary | ICD-10-CM | POA: Insufficient documentation

## 2015-10-09 DIAGNOSIS — N6001 Solitary cyst of right breast: Secondary | ICD-10-CM | POA: Diagnosis not present

## 2015-10-09 DIAGNOSIS — E039 Hypothyroidism, unspecified: Secondary | ICD-10-CM | POA: Insufficient documentation

## 2015-10-09 DIAGNOSIS — Z8673 Personal history of transient ischemic attack (TIA), and cerebral infarction without residual deficits: Secondary | ICD-10-CM | POA: Insufficient documentation

## 2015-10-09 DIAGNOSIS — Z8249 Family history of ischemic heart disease and other diseases of the circulatory system: Secondary | ICD-10-CM | POA: Diagnosis not present

## 2015-10-09 DIAGNOSIS — Z95810 Presence of automatic (implantable) cardiac defibrillator: Secondary | ICD-10-CM | POA: Insufficient documentation

## 2015-10-09 DIAGNOSIS — Z66 Do not resuscitate: Secondary | ICD-10-CM | POA: Diagnosis not present

## 2015-10-09 DIAGNOSIS — K219 Gastro-esophageal reflux disease without esophagitis: Secondary | ICD-10-CM | POA: Insufficient documentation

## 2015-10-09 DIAGNOSIS — F419 Anxiety disorder, unspecified: Secondary | ICD-10-CM | POA: Diagnosis not present

## 2015-10-09 DIAGNOSIS — F329 Major depressive disorder, single episode, unspecified: Secondary | ICD-10-CM | POA: Insufficient documentation

## 2015-10-09 DIAGNOSIS — I2510 Atherosclerotic heart disease of native coronary artery without angina pectoris: Secondary | ICD-10-CM | POA: Diagnosis not present

## 2015-10-09 DIAGNOSIS — Z800 Family history of malignant neoplasm of digestive organs: Secondary | ICD-10-CM | POA: Diagnosis not present

## 2015-10-09 DIAGNOSIS — K5790 Diverticulosis of intestine, part unspecified, without perforation or abscess without bleeding: Secondary | ICD-10-CM | POA: Insufficient documentation

## 2015-10-09 LAB — CBC WITH DIFFERENTIAL/PLATELET
BASOS ABS: 0 10*3/uL (ref 0.0–0.1)
Basophils Relative: 1 %
EOS ABS: 0.1 10*3/uL (ref 0.0–0.7)
Eosinophils Relative: 2 %
HCT: 30.2 % — ABNORMAL LOW (ref 36.0–46.0)
Hemoglobin: 10.1 g/dL — ABNORMAL LOW (ref 12.0–15.0)
LYMPHS ABS: 1.2 10*3/uL (ref 0.7–4.0)
Lymphocytes Relative: 26 %
MCH: 30.7 pg (ref 26.0–34.0)
MCHC: 33.4 g/dL (ref 30.0–36.0)
MCV: 91.8 fL (ref 78.0–100.0)
MONO ABS: 0.4 10*3/uL (ref 0.1–1.0)
MONOS PCT: 8 %
NEUTROS PCT: 63 %
Neutro Abs: 3 10*3/uL (ref 1.7–7.7)
PLATELETS: 188 10*3/uL (ref 150–400)
RBC: 3.29 MIL/uL — ABNORMAL LOW (ref 3.87–5.11)
RDW: 12.5 % (ref 11.5–15.5)
WBC: 4.6 10*3/uL (ref 4.0–10.5)

## 2015-10-09 LAB — IRON AND TIBC
Iron: 62 ug/dL (ref 28–170)
SATURATION RATIOS: 22 % (ref 10.4–31.8)
TIBC: 287 ug/dL (ref 250–450)
UIBC: 225 ug/dL

## 2015-10-09 LAB — FERRITIN: Ferritin: 152 ng/mL (ref 11–307)

## 2015-10-09 NOTE — Patient Instructions (Addendum)
[facility] at [facility] Discharge Instructions  RECOMMENDATIONS MADE BY THE CONSULTANT AND ANY TEST RESULTS WILL BE SENT TO YOUR REFERRING PHYSICIAN.  You saw Dr. Whitney Muse today. Standing orders for CBC with diff, iron studies ferritin every 6 weeks Return to center in 4 months to see Tom for follow up  Thank you for choosing [place] at [facility] to provide your oncology and hematology care.  To afford each patient quality time with our provider, please arrive at least 15 minutes before your scheduled appointment time.   Beginning January 23rd 2017 lab work for the [facility] will be done in the  Main lab at [facility] on 1st floor. If you have a lab appointment with the [facility] please come in thru the  Main Entrance and check in at the main information desk  You need to re-schedule your appointment should you arrive 10 or more minutes late.  We strive to give you quality time with our providers, and arriving late affects you and other patients whose appointments are after yours.  Also, if you no show three or more times for appointments you may be dismissed from the clinic at the providers discretion.     Again, thank you for choosing [facility].  Our hope is that these requests will decrease the amount of time that you wait before being seen by our physicians.       _____________________________________________________________  Should you have questions after your visit to [facility], please contact our office at [phone] between the hours of 8:30 a.m. and 4:30 p.m.  Voicemails left after 4:30 p.m. will not be returned until the following business day.  For prescription refill requests, have your pharmacy contact our office.         Resources For Cancer Patients and their Caregivers ? American Cancer Society: Can assist with transportation, wigs, general needs, runs Look Good Feel Better.         786 316 7749 ? Cancer Care: Provides financial assistance, online support groups, medication/co-pay assistance.  1-800-813-HOPE (364)140-9623) ? [place] Assists [place] cancer patients and their families through emotional , educational and financial support.  2086952084 ? [place] DSS Where to apply for food stamps, Medicaid and utility assistance. 938-732-0598 ? [facility]: Transportation to medical appointments. 7626716366 ? Social Security Administration: May apply for disability if have a Stage IV cancer. 818-377-0665 509-230-9896 ? [employer], Disability and Transit Services: Assists with nutrition, care and transit needs. [facility] Support Programs: @10RELATIVEDAYS @ > Cancer Support Group  2nd Tuesday of the month 1pm-2pm, Journey Room  > Creative Journey  3rd Tuesday of the month 1130am-1pm, Journey Room  > Look Good Feel Better  1st Wednesday of the month 10am-12 noon, Journey Room (Call Ryan to register [phone])

## 2015-10-10 ENCOUNTER — Telehealth (HOSPITAL_COMMUNITY): Payer: Self-pay

## 2015-10-10 NOTE — Telephone Encounter (Signed)
-----   Message from [facility], PA-C sent at 10/09/2015 10:34 PM EDT ----- Stable

## 2015-10-18 ENCOUNTER — Other Ambulatory Visit (HOSPITAL_COMMUNITY)
Admission: RE | Admit: 2015-10-18 | Discharge: 2015-10-18 | Disposition: A | Discharge status: Home / Self Care | Payer: Medicaid Other | Source: Ambulatory Visit | Attending: Internal Medicine | Admitting: Internal Medicine

## 2015-10-18 DIAGNOSIS — I509 Heart failure, unspecified: Secondary | ICD-10-CM | POA: Insufficient documentation

## 2015-10-18 LAB — CBC
HCT: 30.8 % — ABNORMAL LOW (ref 36.0–46.0)
Hemoglobin: 10 g/dL — ABNORMAL LOW (ref 12.0–15.0)
MCH: 30 pg (ref 26.0–34.0)
MCHC: 32.5 g/dL (ref 30.0–36.0)
MCV: 92.5 fL (ref 78.0–100.0)
PLATELETS: 211 10*3/uL (ref 150–400)
RBC: 3.33 MIL/uL — ABNORMAL LOW (ref 3.87–5.11)
RDW: 12.6 % (ref 11.5–15.5)
WBC: 5.5 10*3/uL (ref 4.0–10.5)

## 2015-10-18 LAB — BASIC METABOLIC PANEL
Anion gap: 6 (ref 5–15)
BUN: 34 mg/dL — AB (ref 6–20)
CO2: 26 mmol/L (ref 22–32)
CREATININE: 2.4 mg/dL — AB (ref 0.44–1.00)
Calcium: 9.2 mg/dL (ref 8.9–10.3)
Chloride: 106 mmol/L (ref 101–111)
GFR calc Af Amer: 24 mL/min — ABNORMAL LOW (ref >60)
GFR, EST NON AFRICAN AMERICAN: 21 mL/min — AB (ref >60)
Glucose, Bld: 85 mg/dL (ref 65–99)
POTASSIUM: 4.8 mmol/L (ref 3.5–5.1)
SODIUM: 138 mmol/L (ref 135–145)

## 2015-10-23 ENCOUNTER — Other Ambulatory Visit: Payer: Self-pay | Admitting: Cardiology

## 2015-10-23 DIAGNOSIS — I5023 Acute on chronic systolic (congestive) heart failure: Secondary | ICD-10-CM

## 2015-10-24 ENCOUNTER — Ambulatory Visit (INDEPENDENT_AMBULATORY_CARE_PROVIDER_SITE_OTHER): Payer: Medicaid Other | Admitting: *Deleted

## 2015-10-24 DIAGNOSIS — I480 Paroxysmal atrial fibrillation: Secondary | ICD-10-CM

## 2015-10-24 LAB — POCT INR: INR: 3.4

## 2015-10-24 MED ORDER — WARFARIN SODIUM 2.5 MG PO TABS: ORAL_TABLET | ORAL | 1 refills | Status: DC

## 2015-10-30 ENCOUNTER — Encounter (HOSPITAL_COMMUNITY)
Admission: RE | Disposition: A | Discharge status: Home / Self Care | Payer: Self-pay | Source: Ambulatory Visit | Attending: Internal Medicine

## 2015-10-30 ENCOUNTER — Encounter (HOSPITAL_COMMUNITY): Payer: Self-pay | Admitting: Nurse Practitioner

## 2015-10-30 ENCOUNTER — Other Ambulatory Visit: Payer: Self-pay

## 2015-10-30 ENCOUNTER — Ambulatory Visit (HOSPITAL_COMMUNITY)
Admission: RE | Admit: 2015-10-30 | Discharge: 2015-10-31 | Disposition: A | Discharge status: Home / Self Care | Payer: Medicaid Other | Source: Ambulatory Visit | Attending: Internal Medicine | Admitting: Internal Medicine

## 2015-10-30 DIAGNOSIS — F329 Major depressive disorder, single episode, unspecified: Secondary | ICD-10-CM | POA: Insufficient documentation

## 2015-10-30 DIAGNOSIS — F419 Anxiety disorder, unspecified: Secondary | ICD-10-CM | POA: Diagnosis not present

## 2015-10-30 DIAGNOSIS — Z4502 Encounter for adjustment and management of automatic implantable cardiac defibrillator: Secondary | ICD-10-CM | POA: Diagnosis not present

## 2015-10-30 DIAGNOSIS — I255 Ischemic cardiomyopathy: Secondary | ICD-10-CM | POA: Diagnosis present

## 2015-10-30 DIAGNOSIS — Z880 Allergy status to penicillin: Secondary | ICD-10-CM | POA: Diagnosis not present

## 2015-10-30 DIAGNOSIS — K219 Gastro-esophageal reflux disease without esophagitis: Secondary | ICD-10-CM | POA: Diagnosis not present

## 2015-10-30 DIAGNOSIS — N189 Chronic kidney disease, unspecified: Secondary | ICD-10-CM | POA: Diagnosis not present

## 2015-10-30 DIAGNOSIS — I252 Old myocardial infarction: Secondary | ICD-10-CM | POA: Diagnosis not present

## 2015-10-30 DIAGNOSIS — I447 Left bundle-branch block, unspecified: Secondary | ICD-10-CM | POA: Insufficient documentation

## 2015-10-30 DIAGNOSIS — I130 Hypertensive heart and chronic kidney disease with heart failure and stage 1 through stage 4 chronic kidney disease, or unspecified chronic kidney disease: Secondary | ICD-10-CM | POA: Diagnosis not present

## 2015-10-30 DIAGNOSIS — E785 Hyperlipidemia, unspecified: Secondary | ICD-10-CM | POA: Diagnosis not present

## 2015-10-30 DIAGNOSIS — Z7901 Long term (current) use of anticoagulants: Secondary | ICD-10-CM | POA: Insufficient documentation

## 2015-10-30 DIAGNOSIS — I2582 Chronic total occlusion of coronary artery: Secondary | ICD-10-CM | POA: Diagnosis not present

## 2015-10-30 DIAGNOSIS — I480 Paroxysmal atrial fibrillation: Secondary | ICD-10-CM | POA: Diagnosis not present

## 2015-10-30 DIAGNOSIS — Z915 Personal history of self-harm: Secondary | ICD-10-CM | POA: Insufficient documentation

## 2015-10-30 DIAGNOSIS — I5022 Chronic systolic (congestive) heart failure: Secondary | ICD-10-CM | POA: Diagnosis present

## 2015-10-30 DIAGNOSIS — I2510 Atherosclerotic heart disease of native coronary artery without angina pectoris: Secondary | ICD-10-CM | POA: Diagnosis not present

## 2015-10-30 DIAGNOSIS — I519 Heart disease, unspecified: Secondary | ICD-10-CM | POA: Diagnosis present

## 2015-10-30 DIAGNOSIS — I69354 Hemiplegia and hemiparesis following cerebral infarction affecting left non-dominant side: Secondary | ICD-10-CM | POA: Diagnosis not present

## 2015-10-30 DIAGNOSIS — D649 Anemia, unspecified: Secondary | ICD-10-CM | POA: Diagnosis not present

## 2015-10-30 DIAGNOSIS — Z959 Presence of cardiac and vascular implant and graft, unspecified: Secondary | ICD-10-CM

## 2015-10-30 DIAGNOSIS — Z66 Do not resuscitate: Secondary | ICD-10-CM | POA: Insufficient documentation

## 2015-10-30 DIAGNOSIS — E039 Hypothyroidism, unspecified: Secondary | ICD-10-CM | POA: Diagnosis not present

## 2015-10-30 DIAGNOSIS — L409 Psoriasis, unspecified: Secondary | ICD-10-CM | POA: Insufficient documentation

## 2015-10-30 HISTORY — PX: EP IMPLANTABLE DEVICE: SHX172B

## 2015-10-30 LAB — PROTIME-INR
INR: 1.73
Prothrombin Time: 20.4 seconds — ABNORMAL HIGH (ref 11.4–15.2)

## 2015-10-30 LAB — SURGICAL PCR SCREEN
MRSA, PCR: NEGATIVE
STAPHYLOCOCCUS AUREUS: NEGATIVE

## 2015-10-30 SURGERY — BIV UPGRADE

## 2015-10-30 MED ORDER — LORAZEPAM 0.5 MG PO TABS
0.5000 mg | ORAL_TABLET | Freq: Two times a day (BID) | ORAL | Status: DC
  Administered 2015-10-30 – 2015-10-31 (×3): 0.5 mg via ORAL
  Filled 2015-10-30 (×3): qty 1

## 2015-10-30 MED ORDER — POTASSIUM CHLORIDE CRYS ER 20 MEQ PO TBCR
20.0000 meq | EXTENDED_RELEASE_TABLET | Freq: Every day | ORAL | Status: DC
  Administered 2015-10-30 – 2015-10-31 (×2): 20 meq via ORAL
  Filled 2015-10-30 (×2): qty 1

## 2015-10-30 MED ORDER — MIDAZOLAM HCL 5 MG/5ML IJ SOLN
INTRAMUSCULAR | Status: AC
  Filled 2015-10-30: qty 5

## 2015-10-30 MED ORDER — CHLORHEXIDINE GLUCONATE 4 % EX LIQD
60.0000 mL | Freq: Once | CUTANEOUS | Status: DC
  Filled 2015-10-30: qty 60

## 2015-10-30 MED ORDER — ONDANSETRON HCL 4 MG/2ML IJ SOLN: 4.0000 mg | Freq: Four times a day (QID) | INTRAMUSCULAR | Status: DC | PRN

## 2015-10-30 MED ORDER — PNEUMOCOCCAL VAC POLYVALENT 25 MCG/0.5ML IJ INJ
0.5000 mL | INJECTION | INTRAMUSCULAR | Status: DC
  Filled 2015-10-30: qty 0.5

## 2015-10-30 MED ORDER — SODIUM CHLORIDE 0.9 % IR SOLN
Status: AC
  Filled 2015-10-30: qty 2

## 2015-10-30 MED ORDER — SODIUM CHLORIDE 0.9 % IR SOLN
80.0000 mg | Status: AC
  Administered 2015-10-30: 80 mg
  Filled 2015-10-30: qty 2

## 2015-10-30 MED ORDER — SODIUM CHLORIDE 0.9% FLUSH: 3.0000 mL | INTRAVENOUS | Status: DC | PRN

## 2015-10-30 MED ORDER — INFLUENZA VAC SPLIT QUAD 0.5 ML IM SUSY
0.5000 mL | PREFILLED_SYRINGE | INTRAMUSCULAR | Status: AC
  Administered 2015-10-31: 0.5 mL via INTRAMUSCULAR
  Filled 2015-10-30: qty 0.5

## 2015-10-30 MED ORDER — VANCOMYCIN HCL IN DEXTROSE 1-5 GM/200ML-% IV SOLN
1000.0000 mg | INTRAVENOUS | Status: AC
  Administered 2015-10-30: 1000 mg via INTRAVENOUS
  Filled 2015-10-30: qty 200

## 2015-10-30 MED ORDER — IOPAMIDOL (ISOVUE-370) INJECTION 76%
INTRAVENOUS | Status: AC
  Filled 2015-10-30: qty 50

## 2015-10-30 MED ORDER — SODIUM CHLORIDE 0.9 % IV SOLN: 250.0000 mL | INTRAVENOUS | Status: DC | PRN

## 2015-10-30 MED ORDER — SODIUM CHLORIDE 0.9 % IV SOLN
INTRAVENOUS | Status: DC
  Administered 2015-10-30: 06:00:00 via INTRAVENOUS

## 2015-10-30 MED ORDER — FENTANYL CITRATE (PF) 100 MCG/2ML IJ SOLN
INTRAMUSCULAR | Status: AC
  Filled 2015-10-30: qty 2

## 2015-10-30 MED ORDER — HEPARIN (PORCINE) IN NACL 2-0.9 UNIT/ML-% IJ SOLN
INTRAMUSCULAR | Status: AC
  Filled 2015-10-30: qty 500

## 2015-10-30 MED ORDER — SERTRALINE HCL 100 MG PO TABS
100.0000 mg | ORAL_TABLET | Freq: Every day | ORAL | Status: DC
  Administered 2015-10-30 – 2015-10-31 (×2): 100 mg via ORAL
  Filled 2015-10-30 (×2): qty 1

## 2015-10-30 MED ORDER — IOPAMIDOL (ISOVUE-370) INJECTION 76%
INTRAVENOUS | Status: DC | PRN
  Administered 2015-10-30: 5 mL via INTRAVENOUS
  Administered 2015-10-30: 15 mL via INTRAVENOUS

## 2015-10-30 MED ORDER — PANTOPRAZOLE SODIUM 40 MG PO TBEC
40.0000 mg | DELAYED_RELEASE_TABLET | Freq: Two times a day (BID) | ORAL | Status: DC
  Administered 2015-10-30 – 2015-10-31 (×3): 40 mg via ORAL
  Filled 2015-10-30 (×3): qty 1

## 2015-10-30 MED ORDER — VANCOMYCIN HCL 500 MG IV SOLR
500.0000 mg | Freq: Once | INTRAVENOUS | Status: AC
  Administered 2015-10-31: 500 mg via INTRAVENOUS
  Filled 2015-10-30: qty 500

## 2015-10-30 MED ORDER — MUPIROCIN 2 % EX OINT
1.0000 "application " | TOPICAL_OINTMENT | Freq: Once | CUTANEOUS | Status: AC
  Administered 2015-10-30: 1 via TOPICAL
  Filled 2015-10-30: qty 22

## 2015-10-30 MED ORDER — SODIUM CHLORIDE 0.9% FLUSH
3.0000 mL | Freq: Two times a day (BID) | INTRAVENOUS | Status: DC
  Administered 2015-10-30 (×2): 3 mL via INTRAVENOUS

## 2015-10-30 MED ORDER — CARVEDILOL 12.5 MG PO TABS
12.5000 mg | ORAL_TABLET | Freq: Two times a day (BID) | ORAL | Status: DC
  Administered 2015-10-30: 12.5 mg via ORAL
  Filled 2015-10-30: qty 1

## 2015-10-30 MED ORDER — HYDROCODONE-ACETAMINOPHEN 5-325 MG PO TABS
1.0000 | ORAL_TABLET | ORAL | Status: DC | PRN
  Administered 2015-10-30 – 2015-10-31 (×4): 2 via ORAL
  Filled 2015-10-30 (×4): qty 2

## 2015-10-30 MED ORDER — ZOLPIDEM TARTRATE 5 MG PO TABS
5.0000 mg | ORAL_TABLET | Freq: Every evening | ORAL | Status: DC | PRN
Start: 2015-10-30 — End: 2015-10-31

## 2015-10-30 MED ORDER — LIDOCAINE HCL (PF) 1 % IJ SOLN
INTRAMUSCULAR | Status: AC
  Filled 2015-10-30: qty 30

## 2015-10-30 MED ORDER — MUPIROCIN 2 % EX OINT
TOPICAL_OINTMENT | CUTANEOUS | Status: AC
  Filled 2015-10-30: qty 22

## 2015-10-30 MED ORDER — VANCOMYCIN HCL IN DEXTROSE 1-5 GM/200ML-% IV SOLN
INTRAVENOUS | Status: AC
  Filled 2015-10-30: qty 200

## 2015-10-30 MED ORDER — MIDAZOLAM HCL 5 MG/5ML IJ SOLN
INTRAMUSCULAR | Status: DC | PRN
  Administered 2015-10-30: 1 mg via INTRAVENOUS

## 2015-10-30 MED ORDER — LEVOTHYROXINE SODIUM 25 MCG PO TABS
12.5000 ug | ORAL_TABLET | Freq: Every day | ORAL | Status: DC
  Administered 2015-10-30 – 2015-10-31 (×2): 12.5 ug via ORAL
  Filled 2015-10-30 (×2): qty 1

## 2015-10-30 MED ORDER — ACETAMINOPHEN 325 MG PO TABS: 325.0000 mg | ORAL_TABLET | ORAL | Status: DC | PRN

## 2015-10-30 MED ORDER — LIDOCAINE HCL (PF) 1 % IJ SOLN
INTRAMUSCULAR | Status: DC | PRN
  Administered 2015-10-30: 50 mL

## 2015-10-30 SURGICAL SUPPLY — 17 items
ADAPTER SEALING SSA-EW-09 (MISCELLANEOUS) ×2 IMPLANT
ASSURA CRTD CD3369-40Q (ICD Generator) ×2 IMPLANT
CABLE SURGICAL S-101-97-12 (CABLE) ×2 IMPLANT
CATH ATTAIN LDS 6216A-MB2 (CATHETERS) ×2 IMPLANT
CATH HEXAPOLAR DAMATO 6F (CATHETERS) ×2 IMPLANT
DEFIB ASSURA MULTI-CHMBR CRT-D (ICD Generator) ×1 IMPLANT
KIT ESSENTIALS PG (KITS) ×2 IMPLANT
LEAD QUARTET 1458Q-86CM (Lead) ×2 IMPLANT
LEAD TENDRIL SDX 2088TC-46CM (Lead) ×2 IMPLANT
PAD DEFIB LIFELINK (PAD) ×2 IMPLANT
SET INTRODUCER MICROPUNCT 5F (INTRODUCER) ×2 IMPLANT
SHEATH CLASSIC 7F (SHEATH) ×2 IMPLANT
SHEATH CLASSIC 9.5F (SHEATH) ×2 IMPLANT
SHIELD RADPAD SCOOP 12X17 (MISCELLANEOUS) ×2 IMPLANT
SLITTER 6232ADJ (MISCELLANEOUS) ×2 IMPLANT
TRAY PACEMAKER INSERTION (PACKS) ×2 IMPLANT
WIRE ACUITY WHISPER EDS 4648 (WIRE) ×2 IMPLANT

## 2015-10-30 NOTE — H&P (View-Only) (Signed)
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 female who presents today for routine electrophysiology followup.  Since her recent ICD Implantation, the patient reports doing very well.  She continues to struggle with CHF and renal failure.  Medicine titration is limited by hypotension.  She recently had syncope related to hypotension. Today, she denies symptoms of palpitations, chest pain, shortness of breath,  lower extremity edema, dizziness,  or ICD shocks.  The patient is otherwise without complaint today.   Past Medical History:  Diagnosis Date  . AICD (automatic cardioverter/defibrillator) present   . Anemia   . Anginal pain ([place])   . Anxiety 2015   . CAD (coronary artery disease)    a. LHC (04/2013): Chronically occluded LAD, moderate dz in large OM1 and severe dz and small posterior lateral vessel    . chronic systolic heart failure    a. ECHO (05/08/13): EF 20-25%, diff HK with akinesis of basal inferior wall, grade 1 DD, trivial MR, trivial TR b) RHC (05/06/13): RA 7, RV 30/2, PA 31/19 (24), PCWP 10, Fick CO/CI: 2.9/1.74, Thermo CO/Ci: 2.4/1.4, PA sat 53%  . Depression   . Diverticulosis   . DNR (do not resuscitate) 06/05/2015   Confirmed with patient on 06/05/2015  . ETOH abuse 1981   started abusing alcohol at age 89, quit   . GERD (gastroesophageal reflux disease) 2015   . History of blood transfusion    "related to losing blood from somewhere; never found out from where"  . Hyperlipidemia 2015  . Hypertension 2015  . Hypothyroidism   . Ischemic cardiomyopathy   . Mass of right breast on mammogram 07/14/2014  . Myocardial infarction ([place])    "I've had many" (09/13/2014)  . Pancreatitis 1980, 2015   in the setting of ETOH  . Persistent atrial fibrillation ([place]) 2015  . PONV (postoperative nausea and vomiting)   . Psoriasis 1968  . 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/2014  . Stroke [facility]) 2011     left side weakness, minimal  . Suicidal overdose ([place]) 10/03/13   Overdoes on Beta Blockers & Xarelto ([place])   Past Surgical History:  Procedure Laterality Date  . ABDOMINAL EXPLORATION SURGERY  2007  . AGILE CAPSULE N/A 06/02/2014   Procedure: AGILE CAPSULE;  Surgeon: Danie Binder, MD;  Location: [facility] ENDO SUITE;  Service: Endoscopy;  Laterality: N/A;  0800  . BIOPSY N/A 11/22/2013   Procedure: GASTRIC BIOPSIES;  Surgeon: Danie Binder, MD;  Location: [facility] ORS;  Service: Endoscopy;  Laterality: N/A;  . CARDIAC CATHETERIZATION    . COLON SURGERY    . COLONOSCOPY WITH PROPOFOL N/A 03/29/2014   SLF: 1. No definite source for anemia identified 2. 8 colon polyps removed 3. Severe diverticulosis noted the transverse colon  and right colon 4. Small internal hemorrohids.   . COLOSTOMY REVERSAL  ?2009  . COLOSTOMY TAKEDOWN  2007  . EP IMPLANTABLE DEVICE N/A 09/13/2014   Procedure: ICD Implant;  Surgeon: [name], MD;  Location: [place] [facility] CV LAB;  Service: Cardiovascular;  Laterality: N/A;  . EP IMPLANTABLE DEVICE  09/13/14   ICD St Jude, placed   . ESOPHAGOGASTRODUODENOSCOPY (EGD) WITH PROPOFOL N/A 11/22/2013   Dr. Oneida Alar: gastritis and duodenitis, negative H.pylori  . GIVENS CAPSULE STUDY N/A 06/10/2014   Procedure: GIVENS CAPSULE STUDY;  Surgeon: Danie Binder, MD;  Location[facility];  Service: Endoscopy;  Laterality: N/A;  0700  . ILEOSTOMY  ?2009  . ILEOSTOMY CLOSURE  ?2010  . LEFT AND RIGHT HEART CATHETERIZATION WITH CORONARY ANGIOGRAM N/A 05/10/2013   Procedure: LEFT AND RIGHT HEART CATHETERIZATION WITH CORONARY ANGIOGRAM;  Surgeon: [name], MD;  Location: [facility] CATH LAB;  Service: Cardiovascular;  Laterality: N/A;  . LEFT AND RIGHT HEART CATHETERIZATION WITH CORONARY ANGIOGRAM N/A 04/14/2014   Procedure: LEFT AND RIGHT HEART CATHETERIZATION WITH CORONARY ANGIOGRAM;  Surgeon: Larey Dresser, MD;  Location: [facility] CATH LAB;  Service: Cardiovascular;  Laterality: N/A;  . LEFT  HEART CATHETERIZATION WITH CORONARY ANGIOGRAM N/A 09/03/2013   Procedure: LEFT HEART CATHETERIZATION WITH CORONARY ANGIOGRAM;  Surgeon: Sinclair Grooms, MD;  Location: [facility] CATH LAB;  Service: Cardiovascular;  Laterality: N/A;  . POLYPECTOMY N/A 03/29/2014   Procedure: POLYPECTOMY;  Surgeon: Danie Binder, MD;  Location: [facility] ORS;  Service: Endoscopy;  Laterality: N/A;  ascending colon, sigmoid colon x4, rectal x3    ROS- all systems are reviewed and negative except as per HPI above  Current Outpatient Prescriptions  Medication Sig Dispense Refill  . acetaminophen (TYLENOL) 325 MG tablet Take 650 mg by mouth every 6 (six) hours as needed for mild pain.    Marland Kitchen ammonium lactate (AMLACTIN) 12 % cream Apply 1 g topically daily as needed for dry skin (apply to elbows).     Marland Kitchen atorvastatin (LIPITOR) 80 MG tablet Take 1 tablet (80 mg total) by mouth daily. 30 tablet 6  . calcium carbonate (OS-CAL - DOSED IN MG OF ELEMENTAL CALCIUM) 1250 (500 Ca) MG tablet Take 1 tablet (500 mg of elemental calcium total) by mouth 3 (three) times daily with meals. 90 tablet 3  . carvedilol (COREG) 12.5 MG tablet Take 1 tablet (12.5 mg total) by mouth 2 (two) times daily. 60 tablet 6  . ezetimibe (ZETIA) 10 MG tablet Take 10 mg by mouth daily.    Marland Kitchen levothyroxine (SYNTHROID, LEVOTHROID) 25 MCG tablet Take 25 mcg by mouth daily before breakfast.     . lisinopril (PRINIVIL,ZESTRIL) 2.5 MG tablet Take 1 tablet (2.5 mg total) by mouth daily. D/C order for Hydralazine 30 tablet 6  . LORazepam (ATIVAN) 0.5 MG tablet Take 1 tablet (0.5 mg total) by mouth 2 (two) times daily. 60 tablet 2  . nitroGLYCERIN (NITROSTAT) 0.3 MG SL tablet Place 0.3 mg under the tongue every 5 (five) minutes as needed for chest pain (MAX 3 TABLETS). Reported on 07/18/2015    . ondansetron (ZOFRAN) 4 MG tablet Take 4 mg by mouth every 8 (eight) hours as needed for nausea or vomiting. Reported on 07/18/2015    . pantoprazole (PROTONIX) 40 MG tablet TAKE 1 TABLET  BY MOUTH TWICE DAILY BEFORE A MEAL. 60 tablet 5  . potassium chloride SA (K-DUR,KLOR-CON) 20 MEQ tablet Take 1 tablet (20 mEq total) by mouth daily. 30 tablet 6  . sertraline (ZOLOFT) 50 MG tablet Take 100 mg by mouth daily.    Marland Kitchen torsemide (DEMADEX) 20 MG tablet Take 1 tablet (20 mg total) by mouth every other day. 15 tablet 2  . warfarin (COUMADIN) 2.5 MG tablet Continue coumadin 1 1/2 tablets daily except 2 tablets on Mondays, Wednesdays and Fridays 55 tablet 1   No current facility-administered medications for this visit.     Physical Exam: Vitals:   10/04/15 0925  BP: (!) 86/54  Pulse: 69  Weight: 124 lb 3.2 oz (56.3 kg)  Height: 5\' 3"  (1.6 m)    GEN- The patient is well  appearing, alert and oriented x 3 today.   Head- normocephalic, atraumatic Eyes-  Sclera clear, conjunctiva pink Ears- hearing intact Oropharynx- clear Lungs- Clear to ausculation bilaterally, normal work of breathing Chest- ICD pocket is well healed Heart- Regular rate and rhythm, no murmurs, rubs or gallops, PMI not laterally displaced GI- soft, NT, ND, + BS Extremities- no clubbing, cyanosis, or edema  ICD interrogation- reviewed in detail today,  See PACEART report  Assessment and Plan:  1.  Chronic systolic dysfunction euvolemic today however she struggles with CHF and renal failure She now has LBBB with QRS > 150 msec.  At time of ICD implant, she had QRS < 130 and thus LV lead was not placed.  I agree with Dr Aundra Dubin that given her worsening CHF and widening of her QRS (currently LBBB with QRS > 150 msec) that upgrade to CRT is warranted. Risks, benefits, alternatives to ICD upgrade to CRT-D were discussed in detail with the patient today. The patient  understands that the risks include but are not limited to bleeding, infection, pneumothorax, perforation, tamponade, vascular damage, renal failure, MI, stroke, death, inappropriate shocks, and lead dislodgement and wishes to proceed.  We will therefore  schedule device implantation at the next available time.  I have instructed her to hold coumadin for 2 days prior to the procedure.   [name] MD, [facility] 10/04/2015 10:15 AM

## 2015-10-30 NOTE — Interval H&P Note (Signed)
History and Physical Interval Note:  10/30/2015 7:17 AM  [name]  has presented today for surgery, with the diagnosis of ischemic CM and LBBB.  The various methods of treatment have been discussed with the patient and family. After consideration of risks, benefits and other options for treatment, the patient has consented to  Procedure(s): BiV Upgrade (N/A) as a surgical intervention .  The patient's history has been reviewed, patient examined, no change in status, stable for surgery.  I have reviewed the patient's chart and labs.  Questions were answered to the patient's satisfaction.    ICD Criteria  Current LVEF:25%. Within 12 months prior to implant: Yes   Heart failure history: Yes, Class III  Cardiomyopathy history: Yes, Ischemic Cardiomyopathy - Prior MI.  Atrial Fibrillation/Atrial Flutter: Yes, Paroxysmal.  Ventricular tachycardia history: No.  Cardiac arrest history: No.  History of syndromes with risk of sudden death: No.  Previous ICD: Yes, Reason for ICD:  Primary prevention.  Current ICD indication: Primary  PPM indication: Yes. Pacing type: Ventricular. Greater than 40% RV pacing requirement anticipated. Indication: Sick Sinus Syndrome   Class I or II Bradycardia indication present: Yes  Upgrade to CRT.  Beta Blocker therapy for 3 or more months: Yes, prescribed.   Ace Inhibitor/ARB therapy for 3 or more months: Yes, prescribed.     [name]

## 2015-10-30 NOTE — Progress Notes (Signed)
Allred MD paged X3 to notify him of patients blood pressure of 78/45, patient said this is normal for her and voiced no concern,no c/o of dizziness or lightheadedness  patient resting quietly in bed will continue to monitor

## 2015-10-30 NOTE — Progress Notes (Signed)
Patient arrived on the unit from the cath lab, assessment completed see flowsheet patient oriented to room and staff, placed on tele ccmd notified, bed in lowest position, call light within reach will continue to monitor

## 2015-10-30 NOTE — Discharge Summary (Signed)
ELECTROPHYSIOLOGY PROCEDURE DISCHARGE SUMMARY    Patient ID: [name],  MRN: [id number], DOB/AGE: Aug 30, 1955 60 y.o.  Admit date: 10/30/2015 Discharge date: 10/31/15  Primary Care Physician: [name], MD  Primary Cardiologist: Dr. Aundra Dubin Electrophysiologist: Dr. Rayann Heman  Primary Discharge Diagnosis:  1. ICM 2. LBBB  Secondary Discharge Diagnosis:  1. CAD 2. HLD 3. Paroxysmal Afib     CHA2DS2Vasc is at least 6, on warfarin     Managed at the coumadin clinic, next is 11/07/15 4. HTN  Allergies  Allergen Reactions  . Morphine And Related Other (See Comments)    Dizziness and hallucinations  . Penicillins Anaphylaxis and Rash    Has patient had a PCN reaction causing immediate rash, facial/tongue/throat swelling, SOB or lightheadedness with hypotension:unknown Has patient had a PCN reaction causing severe rash involving mucus membranes or skin necrosis: unknown Has patient had a PCN reaction that required hospitalization unknown Has patient had a PCN reaction occurring within the last 10 years: unknown If all of the above answers are "NO", then may proceed with Cephalosporin use.      Procedures This Admission:  1.  Upgrade of an ICD to CRT-D on 10/30/15 by Dr Rayann Heman.  The patient received a [employer] [place] MP model [phone] (serial  Number [id number]) multi point pacing biventricular ICD, with a St. Jude [employer] [place] , model 2088TC-46  (serial # [id number]) right atrial lead, and a [employer] quality scientist model [phone] (serial number [id number]) lead.  DFT's were deferred at time of implant.  There were no immediate post procedure complications. 2.  CXR on 10/31/15 demonstrated no pneumothorax status post device implantation.   Brief HPI: [name] is a 60 y.o. female was referred to electrophysiology in the outpatient setting for consideration of ujpgrade to CRT-ICD with development of LBBB and difficulty managing CHF symptoms.  Past  medical history includes ICM, LBBB, CAD, HTN, HLD, PAFib.  The patient has persistent LV dysfunction despite guideline directed therapy.  Risks, benefits, and alternatives to ICD implantation were reviewed with the patient who wished to proceed.   Hospital Course:  The patient was admitted and underwent upgrade to CRT-D with RA lead as well with details as outlined above. She was monitored on telemetry overnight which demonstrated AV paced.  Left chest was without hematoma or ecchymosis.  The device was interrogated and found to be functioning normally.  CXR was obtained and demonstrated no pneumothorax status post device implantation.  Wound care, arm mobility, and restrictions were reviewed with the patient.  The patient was examined by Dr. Rayann Heman and considered stable for discharge to home. INR today is 1.54, will OK to resume her warfarin regime unchanged tonight with INR check next week as scheduled.  The patient's discharge medications include an ACE-I (lisinopril) and beta blocker (carvedilol).   Physical Exam: Vitals:   10/30/15 1531 10/30/15 2024 10/31/15 0516 10/31/15 0800  BP: (!) 78/45 (!) 97/52 98/62 (!) 86/48  Pulse: 61 63 60 (!) 59  Resp: 18 18 20 18   Temp: 97.5 F (36.4 C) 98.3 F (36.8 C) 97.8 F (36.6 C) 98.1 F (36.7 C)  TempSrc: Oral Oral Oral Oral  SpO2: 95% 97% 100% 93%  Weight:      Height:         GEN- The patient is well appearing, alert and oriented x 3 today.   HEENT: normocephalic, atraumatic; sclera clear, conjunctiva pink; hearing intact; oropharynx clear Lungs- Clear to ausculation bilaterally, normal  work of breathing.  No wheezes, rales, rhonchi Heart- Regular rate and rhythm, no murmurs, rubs or gallops, PMI not laterally displaced GI- soft, non-tender, non-distended Extremities- no clubbing, cyanosis, or edema MS- no significant deformity or atrophy Skin- warm and dry, no rash or lesion, left chest without hematoma/ecchymosis Psych- euthymic mood,  full affect Neuro- no gross defecits  Labs:   Lab Results  Component Value Date   WBC 5.5 10/18/2015   HGB 10.0 (L) 10/18/2015   HCT 30.8 (L) 10/18/2015   MCV 92.5 10/18/2015   PLT 211 10/18/2015     Recent Labs Lab 10/31/15 0320  NA 138  K 4.5  CL 108  CO2 23  BUN 27*  CREATININE 1.62*  CALCIUM 8.7*  GLUCOSE 84    Discharge Medications:    Medication List    TAKE these medications   acetaminophen 325 MG tablet Commonly known as:  TYLENOL Take 650 mg by mouth every 6 (six) hours as needed for mild pain.   ammonium lactate 12 % cream Commonly known as:  AMLACTIN Apply 1 g topically daily as needed for dry skin (apply to elbows).   atorvastatin 80 MG tablet Commonly known as:  LIPITOR TAKE 1 TABLET BY MOUTH ONCE DAILY.   calcium carbonate 1250 (500 Ca) MG tablet Commonly known as:  OS-CAL - dosed in mg of elemental calcium Take 1 tablet (500 mg of elemental calcium total) by mouth 3 (three) times daily with meals.   carvedilol 12.5 MG tablet Commonly known as:  COREG TAKE 1 TABLET BY MOUTH TWICE DAILY.   ezetimibe 10 MG tablet Commonly known as:  ZETIA TAKE 1 TABLET BY MOUTH ONCE DAILY.   ibuprofen 800 MG tablet Commonly known as:  ADVIL,MOTRIN Take 800 mg by mouth every 6 (six) hours as needed for mild pain.   levothyroxine 25 MCG tablet Commonly known as:  SYNTHROID, LEVOTHROID TAKE 1/2 TABLET BY MOUTH BEFORE BREAKFAST.   lisinopril 2.5 MG tablet Commonly known as:  PRINIVIL,ZESTRIL Take 1 tablet (2.5 mg total) by mouth daily. D/C order for Hydralazine What changed:  additional instructions   LORazepam 0.5 MG tablet Commonly known as:  ATIVAN Take 1 tablet (0.5 mg total) by mouth 2 (two) times daily.   nitroGLYCERIN 0.3 MG SL tablet Commonly known as:  NITROSTAT Place 0.3 mg under the tongue every 5 (five) minutes as needed for chest pain (MAX 3 TABLETS). Reported on 07/18/2015   ondansetron 4 MG tablet Commonly known as:  ZOFRAN Take 4 mg  by mouth every 8 (eight) hours as needed for nausea or vomiting. Reported on 07/18/2015   pantoprazole 40 MG tablet Commonly known as:  PROTONIX TAKE 1 TABLET BY MOUTH TWICE DAILY BEFORE A MEAL.   potassium chloride SA 20 MEQ tablet Commonly known as:  K-DUR,KLOR-CON TAKE 1 TABLET BY MOUTH ONCE DAILY.   QC ANTACID 500 MG chewable tablet Generic drug:  calcium carbonate TAKE 1 TABLET BY MOUTH 3 TIMES DAILY WITH MEALS.   sertraline 50 MG tablet Commonly known as:  ZOLOFT TAKE 2 TABLETS (=TO A 100MG  DOSE) BY MOUTH ONCE DAILY.   torsemide 20 MG tablet Commonly known as:  DEMADEX Take 1 tablet (20 mg total) by mouth every other day.   JANTOVEN 5 MG tablet Generic drug:  warfarin Take 5 mg by mouth See admin instructions. Take 5 mg every Monday, Wednesday and Friday Notes to patient:  Continue your established schedule unchanged   JANTOVEN 2.5 MG tablet Generic drug:  warfarin  Take 3.75 mg by mouth See admin instructions. Take 3.75 mg on Tuesday, Thursday, Saturdays and Sundays Notes to patient:  Continue your established schedule unchanged   warfarin 2.5 MG tablet Commonly known as:  COUMADIN Continue coumadin 1 1/2 tablets daily except 2 tablets on Mondays, Wednesdays and Fridays Notes to patient:  Continue your established schedule unchanged   zolpidem 10 MG tablet Commonly known as:  AMBIEN Take 10 mg by mouth at bedtime as needed for sleep.       Disposition: Home   Discharge Instructions    Diet - low sodium heart healthy    Complete by:  As directed    Increase activity slowly    Complete by:  As directed      Follow-up Information    [facility] Follow up on 11/09/2015.   Specialty:  Cardiology Why:  9:30AM, wound check Contact information: [address], [place] Follow up on 11/07/2015.   Specialty:  Cardiology Why:  11:00AM,  lab/coumadin clinic Contact information: [place] [phone]          Duration of Discharge Encounter: Greater than 30 minutes including physician time.  Venetia Night, PA-C 10/31/2015 8:53 AM   Trude Mcburney

## 2015-10-31 ENCOUNTER — Ambulatory Visit (HOSPITAL_COMMUNITY): Payer: Medicaid Other

## 2015-10-31 DIAGNOSIS — I255 Ischemic cardiomyopathy: Secondary | ICD-10-CM

## 2015-10-31 DIAGNOSIS — I5022 Chronic systolic (congestive) heart failure: Secondary | ICD-10-CM | POA: Diagnosis not present

## 2015-10-31 DIAGNOSIS — Z4502 Encounter for adjustment and management of automatic implantable cardiac defibrillator: Secondary | ICD-10-CM | POA: Diagnosis not present

## 2015-10-31 LAB — BASIC METABOLIC PANEL
ANION GAP: 7 (ref 5–15)
BUN: 27 mg/dL — AB (ref 6–20)
CALCIUM: 8.7 mg/dL — AB (ref 8.9–10.3)
CO2: 23 mmol/L (ref 22–32)
Chloride: 108 mmol/L (ref 101–111)
Creatinine, Ser: 1.62 mg/dL — ABNORMAL HIGH (ref 0.44–1.00)
GFR calc Af Amer: 39 mL/min — ABNORMAL LOW (ref >60)
GFR, EST NON AFRICAN AMERICAN: 33 mL/min — AB (ref >60)
GLUCOSE: 84 mg/dL (ref 65–99)
Potassium: 4.5 mmol/L (ref 3.5–5.1)
Sodium: 138 mmol/L (ref 135–145)

## 2015-10-31 LAB — PROTIME-INR
INR: 1.54
Prothrombin Time: 18.7 seconds — ABNORMAL HIGH (ref 11.4–15.2)

## 2015-10-31 MED FILL — Heparin Sodium (Porcine) 2 Unit/ML in Sodium Chloride 0.9%: INTRAMUSCULAR | Qty: 500 | Status: AC

## 2015-10-31 NOTE — Discharge Instructions (Signed)
OK to resume warfarin today, no changes to home dosing schedule, next INR is scheduled for 11/07/15 @ 11:00AM       Supplemental Discharge Instructions for  Pacemaker/Defibrillator Patients  Activity No heavy lifting or vigorous activity with your left/right arm for 6 to 8 weeks.  Do not raise your left/right arm above your head for one week.  Gradually raise your affected arm as drawn below.             11/03/15                  11/04/15                   11/05/15                 11/06/15 __  NO DRIVING the patient does not drive  WOUND CARE - Keep the wound area clean and dry.  Do not get this area wet for one week. No showers for one week; you may shower on 11/06/15    . - The tape/steri-strips on your wound will fall off; do not pull them off.  No bandage is needed on the site.  DO  NOT apply any creams, oils, or ointments to the wound area. - If you notice any drainage or discharge from the wound, any swelling or bruising at the site, or you develop a fever > 101? F after you are discharged home, call the office at once.  Special Instructions - You are still able to use cellular telephones; use the ear opposite the side where you have your pacemaker/defibrillator.  Avoid carrying your cellular phone near your device. - When traveling through airports, show security personnel your identification card to avoid being screened in the metal detectors.  Ask the security personnel to use the hand wand. - Avoid arc welding equipment, MRI testing (magnetic resonance imaging), TENS units (transcutaneous nerve stimulators).  Call the office for questions about other devices. - Avoid electrical appliances that are in poor condition or are not properly grounded. - Microwave ovens are safe to be near or to operate.  Additional information for defibrillator patients should your device go off: - If your device goes off ONCE and you feel fine afterward, notify the device clinic nurses. - If your  device goes off ONCE and you do not feel well afterward, call 911. - If your device goes off TWICE, call 911. - If your device goes off THREE times in one day, call 911.  DO NOT DRIVE YOURSELF OR A FAMILY MEMBER WITH A DEFIBRILLATOR TO THE HOSPITAL--CALL 911.

## 2015-10-31 NOTE — Progress Notes (Signed)
Patient in a stable condition, discharge teachings completed with patient and caregiver at bedside they verbalized understanding, patient belongings at bedside, iv removed tele dc patient taken off the unit by a staff  volunteer

## 2015-10-31 NOTE — Progress Notes (Signed)
Doing well s/p BiV ICD upgrade. Device interrogation reviewed and normal Discharge to home Resume previous home medicines Routine wound care and follow-up  Will need 6 week appointment with Chanetta Marshall NP for BiV optimization  [name] MD, [facility] 10/31/2015 8:00 AM

## 2015-11-01 ENCOUNTER — Telehealth: Payer: Self-pay | Admitting: *Deleted

## 2015-11-01 NOTE — Telephone Encounter (Signed)
Please call the nurse from [facility] concerning this pt's coumadin orders--she can be reached @ [phone]. I was unable to understand the nurses name on the voicemail

## 2015-11-01 NOTE — Telephone Encounter (Signed)
Called and verified coumadin dose.

## 2015-11-02 ENCOUNTER — Other Ambulatory Visit (HOSPITAL_COMMUNITY): Payer: Self-pay | Admitting: *Deleted

## 2015-11-07 ENCOUNTER — Ambulatory Visit (INDEPENDENT_AMBULATORY_CARE_PROVIDER_SITE_OTHER): Payer: Medicaid Other | Admitting: *Deleted

## 2015-11-07 DIAGNOSIS — I480 Paroxysmal atrial fibrillation: Secondary | ICD-10-CM

## 2015-11-07 LAB — POCT INR: INR: 2.3

## 2015-11-07 MED ORDER — WARFARIN SODIUM 5 MG PO TABS: 5.0000 mg | ORAL_TABLET | ORAL | 3 refills | Status: DC

## 2015-11-09 ENCOUNTER — Encounter: Payer: Self-pay | Admitting: Internal Medicine

## 2015-11-09 ENCOUNTER — Ambulatory Visit (INDEPENDENT_AMBULATORY_CARE_PROVIDER_SITE_OTHER): Payer: Medicaid Other | Admitting: *Deleted

## 2015-11-09 DIAGNOSIS — Z95810 Presence of automatic (implantable) cardiac defibrillator: Secondary | ICD-10-CM | POA: Diagnosis not present

## 2015-11-09 DIAGNOSIS — I255 Ischemic cardiomyopathy: Secondary | ICD-10-CM | POA: Diagnosis not present

## 2015-11-09 DIAGNOSIS — I447 Left bundle-branch block, unspecified: Secondary | ICD-10-CM

## 2015-11-09 LAB — CUP PACEART INCLINIC DEVICE CHECK
Battery Remaining Longevity: 90
Brady Statistic RA Percent Paced: 74 %
Brady Statistic RV Percent Paced: 99.62 %
Date Time Interrogation Session: 20171019121128
HIGH POWER IMPEDANCE MEASURED VALUE: 76.5 Ohm
Implantable Lead Implant Date: 20160823
Implantable Lead Implant Date: 20171009
Lead Channel Impedance Value: 737.5 Ohm
Lead Channel Impedance Value: 737.5 Ohm
Lead Channel Pacing Threshold Amplitude: 0.375 V
Lead Channel Pacing Threshold Pulse Width: 0.5 ms
Lead Channel Sensing Intrinsic Amplitude: 11.6 mV
Lead Channel Setting Pacing Amplitude: 1.375
Lead Channel Setting Sensing Sensitivity: 0.5 mV
MDC IDC LEAD IMPLANT DT: 20171009
MDC IDC LEAD LOCATION: 753858
MDC IDC LEAD LOCATION: 753859
MDC IDC LEAD LOCATION: 753860
MDC IDC MSMT LEADCHNL LV PACING THRESHOLD AMPLITUDE: 0.75 V
MDC IDC MSMT LEADCHNL LV PACING THRESHOLD PULSEWIDTH: 0.5 ms
MDC IDC MSMT LEADCHNL RA IMPEDANCE VALUE: 575 Ohm
MDC IDC MSMT LEADCHNL RA SENSING INTR AMPL: 5 mV
MDC IDC MSMT LEADCHNL RV PACING THRESHOLD AMPLITUDE: 0.375 V
MDC IDC MSMT LEADCHNL RV PACING THRESHOLD PULSEWIDTH: 0.5 ms
MDC IDC SET LEADCHNL LV PACING AMPLITUDE: 2 V
MDC IDC SET LEADCHNL LV PACING PULSEWIDTH: 0.5 ms
MDC IDC SET LEADCHNL RV PACING AMPLITUDE: 2 V
MDC IDC SET LEADCHNL RV PACING PULSEWIDTH: 0.5 ms
Pulse Gen Serial Number: 7382721

## 2015-11-09 NOTE — Progress Notes (Signed)
Wound check appointment. Steri-strips removed. Wound without redness or edema. Incision edges approximated, wound well healed. Normal device function. Thresholds, sensing, and impedances consistent with implant measurements. Device programmed with autocapture on for extra safety margin until 3 month visit. Histogram distribution appropriate for patient and level of activity. Patient enrolled in [facility] clinic. No mode switches or ventricular arrhythmias noted. Patient educated about wound care, arm mobility, lifting restrictions, shock plan. ROV with AS on 12/11/15 and with JA on 01/31/16.

## 2015-11-15 ENCOUNTER — Ambulatory Visit (HOSPITAL_COMMUNITY)
Admission: RE | Admit: 2015-11-15 | Discharge: 2015-11-15 | Disposition: A | Discharge status: Home / Self Care | Payer: Medicaid Other | Source: Ambulatory Visit | Attending: Cardiology | Admitting: Cardiology

## 2015-11-15 ENCOUNTER — Encounter (HOSPITAL_COMMUNITY): Payer: Self-pay

## 2015-11-15 VITALS — BP 90/66 | HR 78 | Wt 123.2 lb

## 2015-11-15 DIAGNOSIS — Z66 Do not resuscitate: Secondary | ICD-10-CM | POA: Diagnosis not present

## 2015-11-15 DIAGNOSIS — N189 Chronic kidney disease, unspecified: Secondary | ICD-10-CM | POA: Insufficient documentation

## 2015-11-15 DIAGNOSIS — Z8673 Personal history of transient ischemic attack (TIA), and cerebral infarction without residual deficits: Secondary | ICD-10-CM | POA: Diagnosis not present

## 2015-11-15 DIAGNOSIS — F1011 Alcohol abuse, in remission: Secondary | ICD-10-CM

## 2015-11-15 DIAGNOSIS — Z86010 Personal history of colonic polyps: Secondary | ICD-10-CM | POA: Diagnosis not present

## 2015-11-15 DIAGNOSIS — D509 Iron deficiency anemia, unspecified: Secondary | ICD-10-CM | POA: Insufficient documentation

## 2015-11-15 DIAGNOSIS — Z720 Tobacco use: Secondary | ICD-10-CM | POA: Diagnosis not present

## 2015-11-15 DIAGNOSIS — E785 Hyperlipidemia, unspecified: Secondary | ICD-10-CM | POA: Diagnosis not present

## 2015-11-15 DIAGNOSIS — I2583 Coronary atherosclerosis due to lipid rich plaque: Secondary | ICD-10-CM

## 2015-11-15 DIAGNOSIS — F411 Generalized anxiety disorder: Secondary | ICD-10-CM | POA: Diagnosis not present

## 2015-11-15 DIAGNOSIS — K219 Gastro-esophageal reflux disease without esophagitis: Secondary | ICD-10-CM | POA: Diagnosis not present

## 2015-11-15 DIAGNOSIS — F17210 Nicotine dependence, cigarettes, uncomplicated: Secondary | ICD-10-CM | POA: Insufficient documentation

## 2015-11-15 DIAGNOSIS — I255 Ischemic cardiomyopathy: Secondary | ICD-10-CM

## 2015-11-15 DIAGNOSIS — F329 Major depressive disorder, single episode, unspecified: Secondary | ICD-10-CM | POA: Insufficient documentation

## 2015-11-15 DIAGNOSIS — I2510 Atherosclerotic heart disease of native coronary artery without angina pectoris: Secondary | ICD-10-CM | POA: Diagnosis not present

## 2015-11-15 DIAGNOSIS — I4891 Unspecified atrial fibrillation: Secondary | ICD-10-CM | POA: Diagnosis not present

## 2015-11-15 DIAGNOSIS — I481 Persistent atrial fibrillation: Secondary | ICD-10-CM | POA: Insufficient documentation

## 2015-11-15 DIAGNOSIS — I110 Hypertensive heart disease with heart failure: Secondary | ICD-10-CM | POA: Insufficient documentation

## 2015-11-15 DIAGNOSIS — Z95810 Presence of automatic (implantable) cardiac defibrillator: Secondary | ICD-10-CM | POA: Diagnosis not present

## 2015-11-15 DIAGNOSIS — T447X2D Poisoning by beta-adrenoreceptor antagonists, intentional self-harm, subsequent encounter: Secondary | ICD-10-CM

## 2015-11-15 DIAGNOSIS — I252 Old myocardial infarction: Secondary | ICD-10-CM | POA: Insufficient documentation

## 2015-11-15 DIAGNOSIS — I5022 Chronic systolic (congestive) heart failure: Secondary | ICD-10-CM | POA: Diagnosis not present

## 2015-11-15 DIAGNOSIS — N183 Chronic kidney disease, stage 3 unspecified: Secondary | ICD-10-CM

## 2015-11-15 DIAGNOSIS — I10 Essential (primary) hypertension: Secondary | ICD-10-CM | POA: Diagnosis not present

## 2015-11-15 DIAGNOSIS — F419 Anxiety disorder, unspecified: Secondary | ICD-10-CM | POA: Diagnosis not present

## 2015-11-15 DIAGNOSIS — Z7901 Long term (current) use of anticoagulants: Secondary | ICD-10-CM | POA: Insufficient documentation

## 2015-11-15 DIAGNOSIS — Z87898 Personal history of other specified conditions: Secondary | ICD-10-CM

## 2015-11-15 DIAGNOSIS — E039 Hypothyroidism, unspecified: Secondary | ICD-10-CM | POA: Diagnosis not present

## 2015-11-15 LAB — BASIC METABOLIC PANEL
Anion gap: 7 (ref 5–15)
BUN: 29 mg/dL — AB (ref 6–20)
CHLORIDE: 107 mmol/L (ref 101–111)
CO2: 27 mmol/L (ref 22–32)
CREATININE: 1.8 mg/dL — AB (ref 0.44–1.00)
Calcium: 9.9 mg/dL (ref 8.9–10.3)
GFR calc Af Amer: 34 mL/min — ABNORMAL LOW (ref >60)
GFR calc non Af Amer: 29 mL/min — ABNORMAL LOW (ref >60)
Glucose, Bld: 128 mg/dL — ABNORMAL HIGH (ref 65–99)
POTASSIUM: 4.9 mmol/L (ref 3.5–5.1)
Sodium: 141 mmol/L (ref 135–145)

## 2015-11-15 MED ORDER — LORAZEPAM 0.5 MG PO TABS: 0.5000 mg | ORAL_TABLET | Freq: Two times a day (BID) | ORAL | 0 refills | Status: DC

## 2015-11-15 NOTE — Progress Notes (Signed)
Patient ID: [name] [name], female   DOB: Sep 20, 1955, 60 y.o.   MRN: [id number]    Advanced Heart Failure Clinic Note   PCP: Dr. Legrand Rams (establishing) Cardiologist: Dr. Aundra Dubin  HPI: [name] [name] is a 60 y/o female with a history of ETOH abuse, ICM, HTN, stroke and chronic systolic heart failure.  Admitted to [facility] 05/06/13 for pancreatitis. She had severely reduced systolic function with an EF of 20-25%. 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 Discharge weight was 140 pounds.   Admitted to [facility] 05/2013 with A fib RVR. Started on amiodarone 400 mg twice a day and Xarelto 20 mg daily. Chemically converted to NSR.  Admitted to [facility]  08/2013 with chest pain. Troponin was elevated. LHC was unchanged from April 2015 so she had CMRI that showed LAD territory was not viable, EF 28%. Plan to continue to treat medically.   Admitted 9/13 through 10/13/13 after attempted suicide. Overdosed on all HF medications. Discharged off carvedilol, lasix, lisinopril, xarelto and digoxin. Discharge weight 120 pounds. Discharged to [facility] which is a group home.  She has been doing much better since that time and is on sertraline.  Had colonoscopy on 03/29/14 with 8 polyps and diverticulosis. She developed increased dyspnea and had LHC/RHC in 3/16 showing mean RA 2, PA 45/21 (mean 34), PCWP mean 21, CI 2.77; LAD totally occluded with some collaterals from RCA, 40-50% OM1, 90% complex ostial PLV. No significant change on coronary angiography.  I thought that her dyspnea may have been due to worsened anemia.   In 8/16, she had St Jude ICD implanted.   Admitted to [facility] 1/24 through 02/24/2015. Treated for sepsis secondary to [facility]. On admit creatinine was 7.7. Entresto and spironolactone stopped. Due to anemia she was transfused 2UPRBCs. Blood loss thought to be from gastritis and duodenitis as well chronic anticoagulants. Eliquis was  stopped, now on warfarin. Discharged with nephrology and gastrology.  Discharge weight was 134 pounds.   On 10/30/15 had St Jude CRT-D upgrade.   She returns for HF followup. Continues to live at [facility]. They help her with her medicines. Continues to smoke 3-4 cigarettes/day. Weight stable.  Feels a lot better after CRT-D.  Denies SOB. Can walk as far as she wants on flat ground. Has mild DOE with inclines. No orthopnea or bendopnea. No PND.  No palpitations. Says she is occasionally getting a strange sensation when she sleeps and she wakes up feeling like she "gulps" air, almost like a hiccup, but different.  Has been going on since March.   ICD interrogation: No VT/VF.   ECHO 09/02/13 EF 25-30%  ECHO 03/2014: EF 25-30%  ECHO 02/15/2015: EF 25-30%.   Labs 05/15/13 K 3.7 Creatinine 0.97 Labs 06/02/13 K 3.9 Creatinine 1.0 Dig level 1.3 --> cut back dig to 0.0625 mg Labs 06/08/13 K 4.7 Creatinine 1.1 Dig level 0.4  Labs 06/19/13: K 4.1, creatinine 1.35 Labs 08/05/13: K 4.5, creatinine 1.17, [name] 25, ALT 28, cholesterol 188, TG 185, HDL 41, LDL 110 Labs 08/17/13: K 4.9 Creatinine 2.0  Labs 09/06/13: Dig level 0.9 K 4.5 Creatinine 1.13 Labs 10/13/13 K 5.1 Creatinine 2.1 Labs 10/20/13 K 4.1 Creatinine 1.32 Labs 01/27/2014: Hgb 9.9, LDL 108  Labs 3/16: K 4.4, creatinine 1.08, hgb 8.6 Labs 9/16: K 3.6, creatinine 1.31, HCT 34.8, TnI 0.07 max.  Labs 10/16: K 4.5, creatinine 1.27 => 1.34, LDL  111, HDL 36 Labs 12/16: HCT 39.9 Labs 2/17: K 3.8, creatinine 5.09 Labs 3/17: LDL 75, HDL 34 Labs 6/17: K 4.4, creatinine 1.96 Labs 8/17: hgb 9.8 Labs 10/17: K 4.5, creatinine 1.62   SH: Lives in a group home. Unemployed. Smoker . Does not drink  alcohol   FH: Father MI at the age of 33  ROS: All systems negative except as listed in HPI, PMH and Problem List.  Past Medical History:  Diagnosis Date  . AICD (automatic cardioverter/defibrillator) present   . Anemia   . Anginal pain  ([place])   . Anxiety 2015   . CAD (coronary artery disease)    a. LHC (04/2013): Chronically occluded LAD, moderate dz in large OM1 and severe dz and small posterior lateral vessel    . chronic systolic heart failure    a. ECHO (05/08/13): EF 20-25%, diff HK with akinesis of basal inferior wall, grade 1 DD, trivial MR, trivial TR b) RHC (05/06/13): RA 7, RV 30/2, PA 31/19 (24), PCWP 10, Fick CO/CI: 2.9/1.74, Thermo CO/Ci: 2.4/1.4, PA sat 53%  . Depression   . Diverticulosis   . DNR (do not resuscitate) 06/05/2015   Confirmed with patient on 06/05/2015  . ETOH abuse 1981   started abusing alcohol at age 74, quit   . GERD (gastroesophageal reflux disease) 2015   . History of blood transfusion    "related to losing blood from somewhere; never found out from where"  . Hyperlipidemia 2015  . Hypertension 2015  . Hypothyroidism   . Ischemic cardiomyopathy   . Mass of right breast on mammogram 07/14/2014  . Myocardial infarction    "I've had many" (09/13/2014)  . Pancreatitis 1980, 2015   in the setting of ETOH  . Persistent atrial fibrillation ([place]) 2015  . PONV (postoperative nausea and vomiting)   . Psoriasis 1968  . 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/2014  . Stroke [facility]) 2011   left side weakness, minimal  . Suicidal overdose ([place]) 10/03/13   Overdoes on Beta Blockers & Xarelto [facility])    Current Outpatient Prescriptions  Medication Sig Dispense Refill  . ammonium lactate (AMLACTIN) 12 % cream Apply 1 g topically daily as needed for dry skin (apply to elbows).     Marland Kitchen atorvastatin (LIPITOR) 80 MG tablet TAKE 1 TABLET BY MOUTH ONCE DAILY. 30 tablet 3  . calcium carbonate (OS-CAL - DOSED IN MG OF ELEMENTAL CALCIUM) 1250 (500 Ca) MG tablet Take 1 tablet (500 mg of elemental calcium total) by mouth 3 (three) times daily with meals. 90 tablet 3  . carvedilol (COREG) 12.5 MG tablet TAKE 1 TABLET BY MOUTH TWICE DAILY. 60 tablet 3  . ezetimibe (ZETIA) 10 MG  tablet TAKE 1 TABLET BY MOUTH ONCE DAILY. 30 tablet 3  . levothyroxine (SYNTHROID, LEVOTHROID) 25 MCG tablet TAKE 1/2 TABLET BY MOUTH BEFORE BREAKFAST. 15 tablet 3  . lisinopril (PRINIVIL,ZESTRIL) 2.5 MG tablet Take 1 tablet (2.5 mg total) by mouth daily. D/C order for Hydralazine 30 tablet 6  . pantoprazole (PROTONIX) 40 MG tablet TAKE 1 TABLET BY MOUTH TWICE DAILY BEFORE A MEAL. 60 tablet 3  . potassium chloride SA (K-DUR,KLOR-CON) 20 MEQ tablet TAKE 1 TABLET BY MOUTH ONCE DAILY. 30 tablet 3  . QC ANTACID 500 MG chewable tablet TAKE 1 TABLET BY MOUTH 3 TIMES DAILY WITH MEALS. 90 tablet 3  . sertraline (ZOLOFT) 50 MG tablet TAKE 2 TABLETS (=TO A 100MG  DOSE) BY MOUTH ONCE DAILY.  60 tablet 3  . torsemide (DEMADEX) 20 MG tablet Take 1 tablet (20 mg total) by mouth every other day. 15 tablet 2  . warfarin (JANTOVEN) 2.5 MG tablet Take 3.75 mg by mouth See admin instructions. Take 3.75 mg on Tuesday, Thursday, Saturdays and Sundays    . warfarin (JANTOVEN) 5 MG tablet Take 1 tablet (5 mg total) by mouth See admin instructions. Take 5 mg every Monday, Wednesday and Friday 20 tablet 3  . acetaminophen (TYLENOL) 325 MG tablet Take 650 mg by mouth every 6 (six) hours as needed for mild pain.    Marland Kitchen ibuprofen (ADVIL,MOTRIN) 800 MG tablet Take 800 mg by mouth every 6 (six) hours as needed for mild pain.    Marland Kitchen LORazepam (ATIVAN) 0.5 MG tablet Take 1 tablet (0.5 mg total) by mouth 2 (two) times daily. (Patient not taking: Reported on 11/15/2015) 60 tablet 2  . nitroGLYCERIN (NITROSTAT) 0.3 MG SL tablet Place 0.3 mg under the tongue every 5 (five) minutes as needed for chest pain (MAX 3 TABLETS). Reported on 07/18/2015    . ondansetron (ZOFRAN) 4 MG tablet Take 4 mg by mouth every 8 (eight) hours as needed for nausea or vomiting. Reported on 07/18/2015    . zolpidem (AMBIEN) 10 MG tablet Take 10 mg by mouth at bedtime as needed for sleep.     No current facility-administered medications for this encounter.    BP  90/66   Pulse 78   Wt 123 lb 4 oz (55.9 kg)   LMP 01/22/1988 (Approximate)   SpO2 99%   BMI 21.83 kg/m   Wt Readings from Last 3 Encounters:  11/15/15 123 lb 4 oz (55.9 kg)  10/30/15 123 lb (55.8 kg)  10/09/15 123 lb (55.8 kg)     PHYSICAL EXAM: General: NAD HEENT: normal Neck: supple. JVP not elevated.  Carotids 2+ bilaterally; no bruits. No thyromegaly or nodule noted.  Cor: PMI normal. RRR. No M/G/R Lungs: Clear, normal effort Abdomen: soft, NT, ND, no HSM. No bruits or masses. +BS  Extremities: no cyanosis, clubbing, rash, edema Neuro: alert & orientedx3, cranial nerves grossly intact. Moves all 4 extremities w/o difficulty. Affect pleasant.  ASSESSMENT & PLAN: 1. Chronic systolic CHF: Primarily ischemic cardiomyopathy though ETOH may have played a role. EF 25-30% (08/2013).  Cardiac MRI in August 2015 showed no viability in the LAD territory with EF 28%. Most recent ECHO (01/2015) EF 25-30%. NYHA class II symptoms.  St Jude CRT-D, much improved symptomatically since upgrade to CRT. Volume status stable on exam.   - Off Entresto and spironolactone AKI in 2/17.    - Continue lisinopril 2.5 mg daily. No room to uptitrate with soft pressures.  BMET today.  - Continue coreg 12.5 mg BID - Continue torsemide 20 mg every other day.  - Restart spironolactone at next appointment if remains stable.  2. CAD: LHC with chronically occluded LAD and severe disease in small PLV.  No change on 3/16 cath compared to prior. cMRI showed no viability in LAD territory.    - She is on warfarin so no ASA. - Continue atorvastatin and Zetia, good lipids in 3/17.  3. Current smoker: Encouraged to quit smoking completely 4. Former Alcohol Abuse: Abstinent since April 2015.   5. Atrial fibrillation: CHADSVasc Score 4 . Paroxysmal. NSR today by exam.  - On coumadin. INR per coumadin clinic.    6. Suicide Attempt:  9/15 suicide attempt via overdose.  She is now on sertraline and doing much better.  -  Will  provide 30 day supply of her Ativan so she does not stop completely.   - Needs PCP follow up. They are working on.  7. Hyperlipidemia: Lipids at goal on Zetia + atorvastatin.  8. Fe deficiency anemia: CBC stable last check.   9. CKD: Creatinine has improved over the last few months but still above her baseline.   - Repeat BMET today.   Shirley Friar, PA-C 11/15/2015 10:11 AM   Patient seen with PA, agree with the above note.  She is symptomatically improved with CRT.  BMET today.  BP too soft for med titration.  Will try to add back spironolactone 12.5 mg daily at followup.    [name] 11/16/2015

## 2015-11-15 NOTE — Patient Instructions (Signed)
Routine lab work today. Will notify you of abnormal results  Follow up with Dr.McLean in 2 months 

## 2015-11-20 ENCOUNTER — Encounter (HOSPITAL_COMMUNITY): Payer: Medicaid Other | Attending: Hematology & Oncology

## 2015-11-20 DIAGNOSIS — D509 Iron deficiency anemia, unspecified: Secondary | ICD-10-CM | POA: Diagnosis not present

## 2015-11-20 DIAGNOSIS — Z800 Family history of malignant neoplasm of digestive organs: Secondary | ICD-10-CM | POA: Insufficient documentation

## 2015-11-20 LAB — CBC WITH DIFFERENTIAL/PLATELET
Basophils Absolute: 0.1 10*3/uL (ref 0.0–0.1)
Basophils Relative: 1 %
Eosinophils Absolute: 0.2 10*3/uL (ref 0.0–0.7)
Eosinophils Relative: 3 %
HEMATOCRIT: 31.8 % — AB (ref 36.0–46.0)
Hemoglobin: 10.4 g/dL — ABNORMAL LOW (ref 12.0–15.0)
LYMPHS PCT: 28 %
Lymphs Abs: 2 10*3/uL (ref 0.7–4.0)
MCH: 30.1 pg (ref 26.0–34.0)
MCHC: 32.7 g/dL (ref 30.0–36.0)
MCV: 92.2 fL (ref 78.0–100.0)
MONO ABS: 0.7 10*3/uL (ref 0.1–1.0)
MONOS PCT: 11 %
NEUTROS ABS: 4 10*3/uL (ref 1.7–7.7)
Neutrophils Relative %: 57 %
Platelets: 230 10*3/uL (ref 150–400)
RBC: 3.45 MIL/uL — ABNORMAL LOW (ref 3.87–5.11)
RDW: 12.5 % (ref 11.5–15.5)
WBC: 6.9 10*3/uL (ref 4.0–10.5)

## 2015-11-20 LAB — FERRITIN: Ferritin: 99 ng/mL (ref 11–307)

## 2015-11-27 ENCOUNTER — Encounter: Payer: Self-pay | Admitting: Internal Medicine

## 2015-11-28 ENCOUNTER — Ambulatory Visit (INDEPENDENT_AMBULATORY_CARE_PROVIDER_SITE_OTHER): Payer: Medicaid Other | Admitting: *Deleted

## 2015-11-28 DIAGNOSIS — I480 Paroxysmal atrial fibrillation: Secondary | ICD-10-CM | POA: Diagnosis not present

## 2015-11-28 LAB — POCT INR: INR: 2.4

## 2015-11-30 ENCOUNTER — Encounter: Payer: Self-pay | Admitting: Nurse Practitioner

## 2015-11-30 ENCOUNTER — Ambulatory Visit (INDEPENDENT_AMBULATORY_CARE_PROVIDER_SITE_OTHER): Payer: Medicaid Other | Admitting: Nurse Practitioner

## 2015-11-30 VITALS — BP 91/68 | HR 77 | Temp 97.9°F | Ht 63.0 in | Wt 121.6 lb

## 2015-11-30 DIAGNOSIS — R1013 Epigastric pain: Secondary | ICD-10-CM | POA: Diagnosis not present

## 2015-11-30 DIAGNOSIS — K219 Gastro-esophageal reflux disease without esophagitis: Secondary | ICD-10-CM | POA: Diagnosis not present

## 2015-11-30 NOTE — Patient Instructions (Signed)
1. Continue taking your acid blocker. 2. Continue to avoid foods and substances that make your acid reflux worse. 3. We will refer you for testing related to that "knot-like" sensation in your upper stomach area. 4. Return for follow-up in 3 months.

## 2015-11-30 NOTE — Assessment & Plan Note (Signed)
The patient describes a spasming type not take it really hard and rolls up underneath her rib cage. She's had prolonged GERD, gastritis, duodenitis. Symptoms for GERD are currently well-controlled. Per her description it sounds like she may have esophageal spasm. I will refer her for manometry to investigate further. Pending findings, can consider treatment options. Return for follow-up in 3 months.

## 2015-11-30 NOTE — Progress Notes (Signed)
Referring Provider: Boykin Nearing, MD Primary Care Physician:  Rosita Fire, MD Primary GI:  Dr. Oneida Alar  Chief Complaint  Patient presents with  . Abdominal Pain    upper mid abd area, "gets hard and rolls up under my rib cage"    HPI:   [name] is a 60 y.o. female who presents for follow-up on GERD. The patient was last seen in our office 08/30/2015 for GERD and iron deficiency anemia. She does have a history of iron deficiency anemia and after extensive GI evaluation it was deemed multifactorial in nature including gastritis, duodenitis, anticoagulation for atrial fibrillation, kidney disease. Sees hematology for monitoring and treatment. At her last office visit he was also noted that she was back on Coumadin related to A. fib and heart failure with no overt bleeding noted. GERD symptoms well controlled on PPI. Recommended continue PPI therapy, continue follow-up with hematology for anemia management, return for follow-up in 3 months.  Today she states she's doing well overall. Feels a lot better after BiV upgrade. GERD well-controlled on PPI. Denies abdominal pain, N/V, hematochezia, melena, fever, chills, unintentional weight loss. Still has "knott" sensation in her epigastric area which "gets real hard then rolls up under my rib cage." It hurts when it occurs, typically lasts until she lays flat or repositions and can last a minute. Denies chest pain, dyspnea, dizziness, lightheadedness, syncope, near syncope. Denies any other upper or lower GI symptoms.  Past Medical History:  Diagnosis Date  . AICD (automatic cardioverter/defibrillator) present   . Anemia   . Anginal pain ([place])   . Anxiety 2015   . CAD (coronary artery disease)    a. LHC (04/2013): Chronically occluded LAD, moderate dz in large OM1 and severe dz and small posterior lateral vessel    . chronic systolic heart failure    a. ECHO (05/08/13): EF 20-25%, diff HK with akinesis of basal inferior wall, grade 1 DD,  trivial MR, trivial TR b) RHC (05/06/13): RA 7, RV 30/2, PA 31/19 (24), PCWP 10, Fick CO/CI: 2.9/1.74, Thermo CO/Ci: 2.4/1.4, PA sat 53%  . Depression   . Diverticulosis   . DNR (do not resuscitate) 06/05/2015   Confirmed with patient on 06/05/2015  . ETOH abuse 1981   started abusing alcohol at age 60, quit   . GERD (gastroesophageal reflux disease) 2015   . History of blood transfusion    "related to losing blood from somewhere; never found out from where"  . Hyperlipidemia 2015  . Hypertension 2015  . Hypothyroidism   . Ischemic cardiomyopathy   . Mass of right breast on mammogram 07/14/2014  . Myocardial infarction    "I've had many" (09/13/2014)  . Pancreatitis 1980, 2015   in the setting of ETOH  . Persistent atrial fibrillation ([place]) 2015  . PONV (postoperative nausea and vomiting)   . Psoriasis 1968  . 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/2014  . Stroke [facility]) 2011   left side weakness, minimal  . Suicidal overdose ([place]) 10/03/13   Overdoes on Beta Blockers & Xarelto ([place])    Past Surgical History:  Procedure Laterality Date  . ABDOMINAL EXPLORATION SURGERY  2007  . AGILE CAPSULE N/A 06/02/2014   Procedure: AGILE CAPSULE;  Surgeon: Danie Binder, MD;  Location: [facility] ENDO SUITE;  Service: Endoscopy;  Laterality: N/A;  0800  . BIOPSY N/A 11/22/2013   Procedure: GASTRIC BIOPSIES;  Surgeon: Danie Binder, MD;  Location: [facility] ORS;  Service: Endoscopy;  Laterality: N/A;  . CARDIAC CATHETERIZATION    . COLON SURGERY    . COLONOSCOPY WITH PROPOFOL N/A 03/29/2014   SLF: 1. No definite source for anemia identified 2. 8 colon polyps removed 3. Severe diverticulosis noted the transverse colon  and right colon 4. Small internal hemorrohids.   . COLOSTOMY REVERSAL  ?2009  . COLOSTOMY TAKEDOWN  2007  . EP IMPLANTABLE DEVICE N/A 09/13/2014   Procedure: ICD Implant;  Surgeon: [name], MD;  Location: [place] CV LAB;  Service: Cardiovascular;   Laterality: N/A;  . EP IMPLANTABLE DEVICE  09/13/14   ICD St Jude, placed   . EP IMPLANTABLE DEVICE N/A 10/30/2015   Procedure: BiV Upgrade;  Surgeon: [name], MD;  Location: [place] CV LAB;  Service: Cardiovascular;  Laterality: N/A;  . ESOPHAGOGASTRODUODENOSCOPY (EGD) WITH PROPOFOL N/A 11/22/2013   Dr. Oneida Alar: gastritis and duodenitis, negative H.pylori  . GIVENS CAPSULE STUDY N/A 06/10/2014   Procedure: GIVENS CAPSULE STUDY;  Surgeon: Danie Binder, MD;  [facility]: [facility];  Service: Endoscopy;  Laterality: N/A;  0700  . ILEOSTOMY  ?2009  . ILEOSTOMY CLOSURE  ?2010  . LEFT AND RIGHT HEART CATHETERIZATION WITH CORONARY ANGIOGRAM N/A 05/10/2013   Procedure: LEFT AND RIGHT HEART CATHETERIZATION WITH CORONARY ANGIOGRAM;  Surgeon: [name], MD;  Location: [facility] CATH LAB;  Service: Cardiovascular;  Laterality: N/A;  . LEFT AND RIGHT HEART CATHETERIZATION WITH CORONARY ANGIOGRAM N/A 04/14/2014   Procedure: LEFT AND RIGHT HEART CATHETERIZATION WITH CORONARY ANGIOGRAM;  Surgeon: Larey Dresser, MD;  Location: [facility] CATH LAB;  Service: Cardiovascular;  Laterality: N/A;  . LEFT HEART CATHETERIZATION WITH CORONARY ANGIOGRAM N/A 09/03/2013   Procedure: LEFT HEART CATHETERIZATION WITH CORONARY ANGIOGRAM;  Surgeon: Sinclair Grooms, MD;  Location: [facility] CATH LAB;  Service: Cardiovascular;  Laterality: N/A;  . POLYPECTOMY N/A 03/29/2014   Procedure: POLYPECTOMY;  Surgeon: Danie Binder, MD;  Location: [facility] ORS;  Service: Endoscopy;  Laterality: N/A;  ascending colon, sigmoid colon x4, rectal x3    Current Outpatient Prescriptions  Medication Sig Dispense Refill  . acetaminophen (TYLENOL) 325 MG tablet Take 650 mg by mouth every 6 (six) hours as needed for mild pain.    Marland Kitchen ammonium lactate (AMLACTIN) 12 % cream Apply 1 g topically daily as needed for dry skin (apply to elbows).     Marland Kitchen atorvastatin (LIPITOR) 80 MG tablet TAKE 1 TABLET BY MOUTH ONCE DAILY. 30 tablet 3  . calcium carbonate (OS-CAL -  DOSED IN MG OF ELEMENTAL CALCIUM) 1250 (500 Ca) MG tablet Take 1 tablet (500 mg of elemental calcium total) by mouth 3 (three) times daily with meals. 90 tablet 3  . carvedilol (COREG) 12.5 MG tablet TAKE 1 TABLET BY MOUTH TWICE DAILY. 60 tablet 3  . ezetimibe (ZETIA) 10 MG tablet TAKE 1 TABLET BY MOUTH ONCE DAILY. 30 tablet 3  . ibuprofen (ADVIL,MOTRIN) 800 MG tablet Take 800 mg by mouth every 6 (six) hours as needed for mild pain.    Marland Kitchen levothyroxine (SYNTHROID, LEVOTHROID) 25 MCG tablet TAKE 1/2 TABLET BY MOUTH BEFORE BREAKFAST. 15 tablet 3  . lisinopril (PRINIVIL,ZESTRIL) 2.5 MG tablet Take 1 tablet (2.5 mg total) by mouth daily. D/C order for Hydralazine 30 tablet 6  . LORazepam (ATIVAN) 0.5 MG tablet Take 1 tablet (0.5 mg total) by mouth 2 (two) times daily. 60 tablet 0  . nitroGLYCERIN (NITROSTAT) 0.3 MG SL tablet Place 0.3 mg under the tongue every 5 (five) minutes as needed  for chest pain (MAX 3 TABLETS). Reported on 07/18/2015    . ondansetron (ZOFRAN) 4 MG tablet Take 4 mg by mouth every 8 (eight) hours as needed for nausea or vomiting. Reported on 07/18/2015    . pantoprazole (PROTONIX) 40 MG tablet TAKE 1 TABLET BY MOUTH TWICE DAILY BEFORE A MEAL. 60 tablet 3  . potassium chloride SA (K-DUR,KLOR-CON) 20 MEQ tablet TAKE 1 TABLET BY MOUTH ONCE DAILY. 30 tablet 3  . sertraline (ZOLOFT) 50 MG tablet TAKE 2 TABLETS (=TO A 100MG  DOSE) BY MOUTH ONCE DAILY. 60 tablet 3  . torsemide (DEMADEX) 20 MG tablet Take 1 tablet (20 mg total) by mouth every other day. 15 tablet 2  . warfarin (JANTOVEN) 2.5 MG tablet Take 3.75 mg by mouth See admin instructions. Take 3.75 mg on Tuesday, Thursday, Saturdays and Sundays    . warfarin (JANTOVEN) 5 MG tablet Take 1 tablet (5 mg total) by mouth See admin instructions. Take 5 mg every Monday, Wednesday and Friday 20 tablet 3   No current facility-administered medications for this visit.     Allergies as of 11/30/2015 - Review Complete 11/30/2015  Allergen  Reaction Noted  . Morphine and related Other (See Comments) 09/24/2013  . Penicillins Anaphylaxis and Rash 05/06/2013    Family History  Problem Relation Age of Onset  . CAD Father 70    MI  . CAD Mother 23    MI  . Alcohol abuse Mother   . Colon cancer Mother     in her 2s  . CAD Sister 55    Stent  . Diabetes Sister   . CAD Brother 62    MI  . Cancer Neg Hx     Social History   Social History  . Marital status: Married    Spouse name: N/A  . Number of children: 2  . Years of education: 10   Occupational History  . Unemployed     Social History Main Topics  . Smoking status: Current Some Day Smoker    Packs/day: 0.20    Years: 48.00    Types: Cigarettes  . Smokeless tobacco: Never Used  . Alcohol use No     Comment: last drink in [id number], former alcoholic  . Drug use: No  . Sexual activity: No   Other Topics Concern  . None   Social History Narrative   Lived previously with her spouse in an [place].     Son and daughter   Daughter in [place]   Son in [place]   Two grandchildren.       Presently in a group home [place], moved in 10/13/13.           Review of Systems: Complete ROS negative except as per HPI.   Physical Exam: BP 91/68   Pulse 77   Temp 97.9 F (36.6 C) (Oral)   Ht 5\' 3"  (1.6 m)   Wt 121 lb 9.6 oz (55.2 kg)   LMP 01/22/1988 (Approximate)   BMI 21.54 kg/m  General:   Alert and oriented. Pleasant and cooperative. Well-nourished and well-developed.  Head:  Normocephalic and atraumatic. Eyes:  Without icterus, sclera clear and conjunctiva pink.  Ears:  Normal auditory acuity. Cardiovascular:  S1, S2 present without murmurs appreciated. Extremities without clubbing or edema. Device noted left upper chest consistent of BiV device Respiratory:  Clear to auscultation bilaterally. No wheezes, rales, or rhonchi. No distress.  Gastrointestinal:  +BS, soft, non-tender and non-distended. No HSM noted. No  guarding or rebound. No  masses appreciated.  Rectal:  Deferred  Musculoskalatal:  Symmetrical without gross deformities. Neurologic:  Alert and oriented x4;  grossly normal neurologically. Psych:  Alert and cooperative. Normal mood and affect. Heme/Lymph/Immune: No excessive bruising noted.    11/30/2015 9:33 AM   Disclaimer: This note was dictated with voice recognition software. Similar sounding words can inadvertently be transcribed and may not be corrected upon review.

## 2015-11-30 NOTE — Assessment & Plan Note (Signed)
Symptoms are well controlled. Continue current medications, return for follow-up in 3 months.

## 2015-12-04 IMAGING — US US ABDOMEN COMPLETE
1 series · 13 of 25 positions shown · non-contrast
Comparison: DG ABD ACUTE W/CHEST dated 05/06/2013; CT ABD W/CM dated
03/23/2007; US ABDOMEN COMPLETE dated 03/24/2007

CLINICAL DATA: Pancreatitis

EXAM:
ULTRASOUND ABDOMEN COMPLETE

[Series 1: us abdomen complete · 0.26mm/px · 13 of 50 slices shown]
[im 1/50]
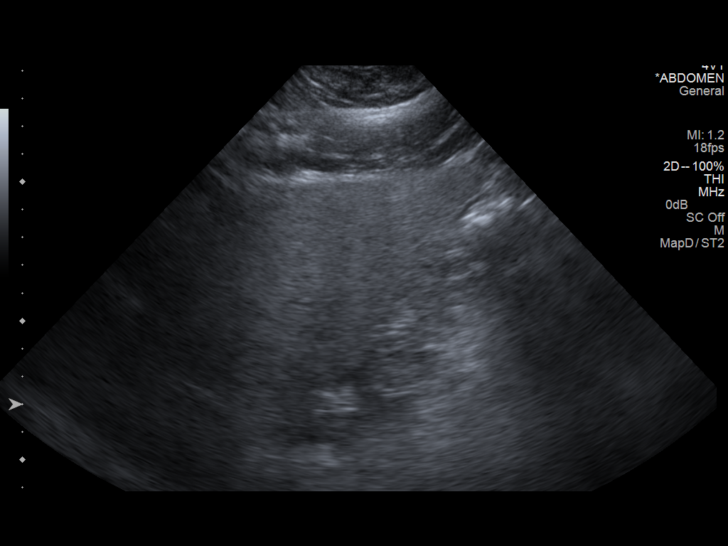
[im 5/50]
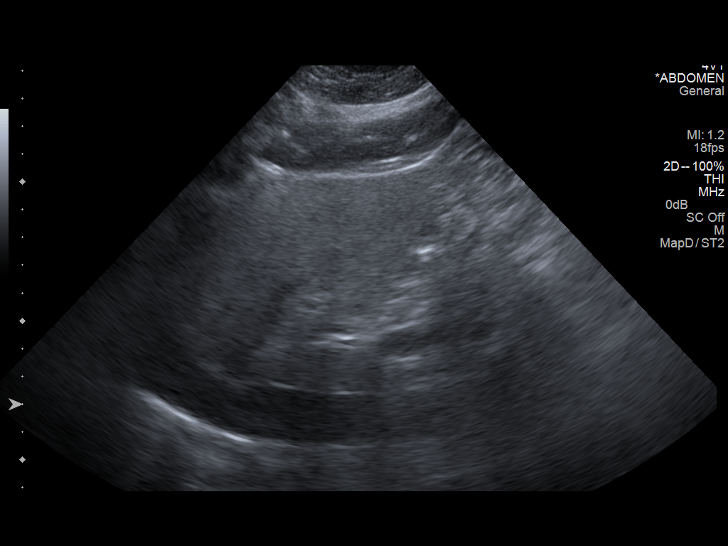
[im 9/50]
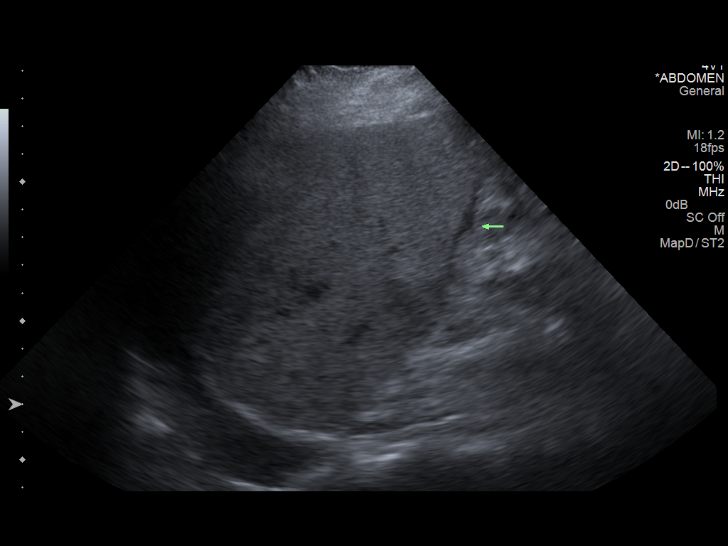
[im 13/50]
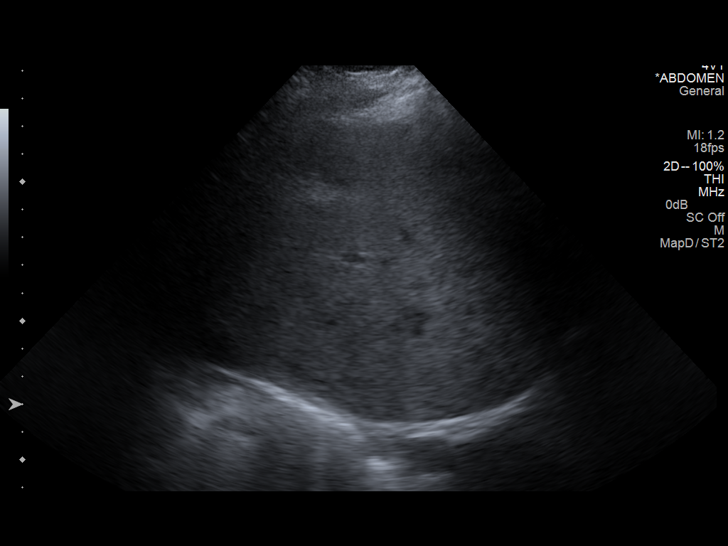
[im 17/50]
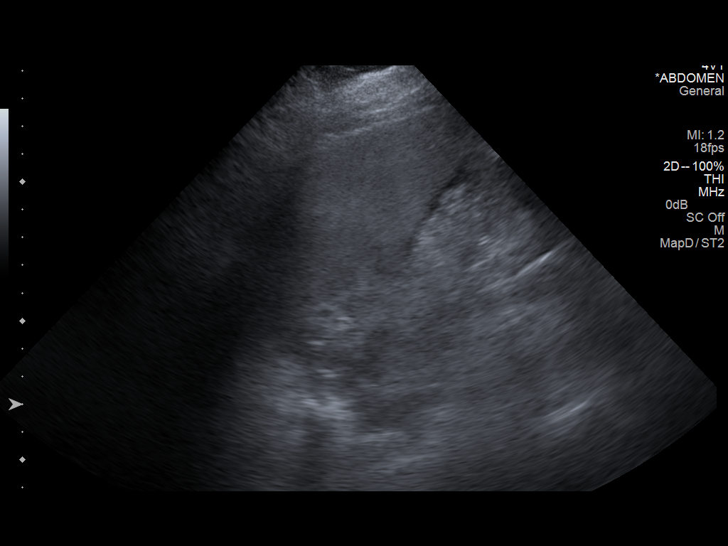
[im 21/50]
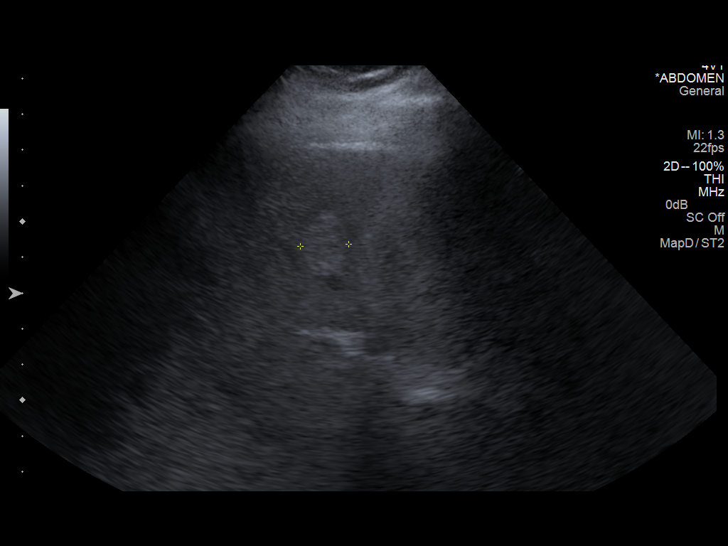
[im 25/50]
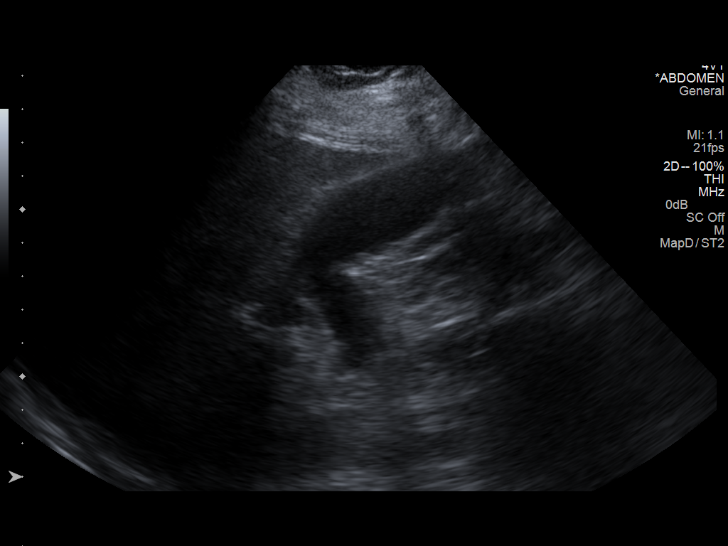
[im 29/50]
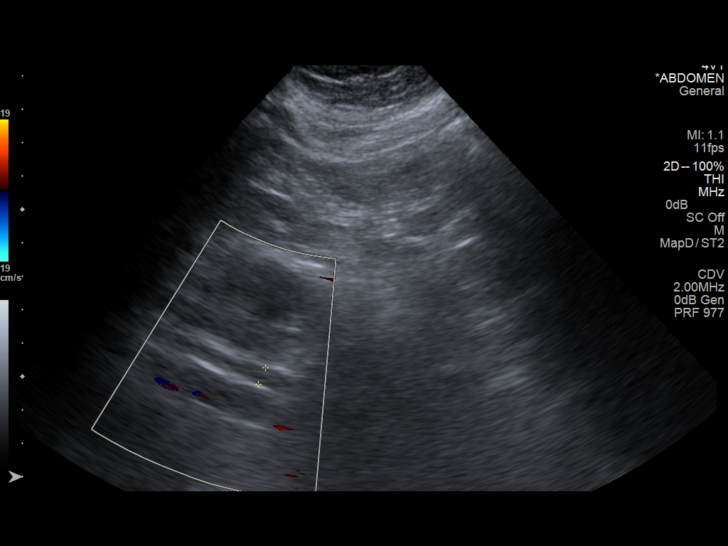
[im 33/50]
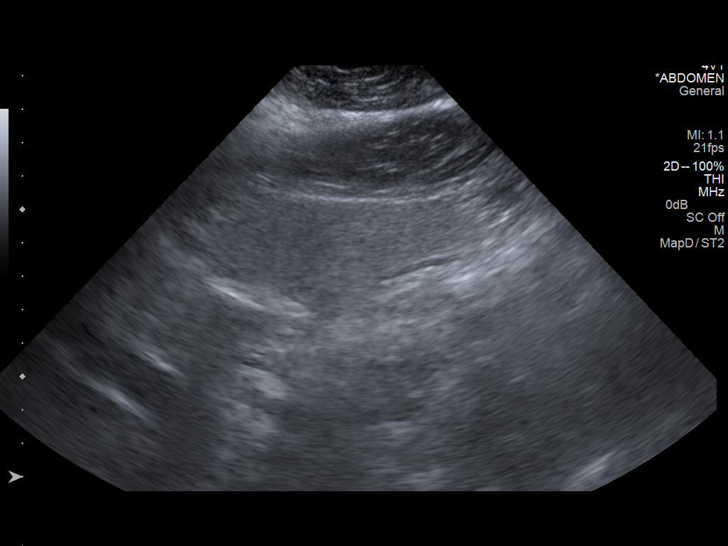
[im 37/50]
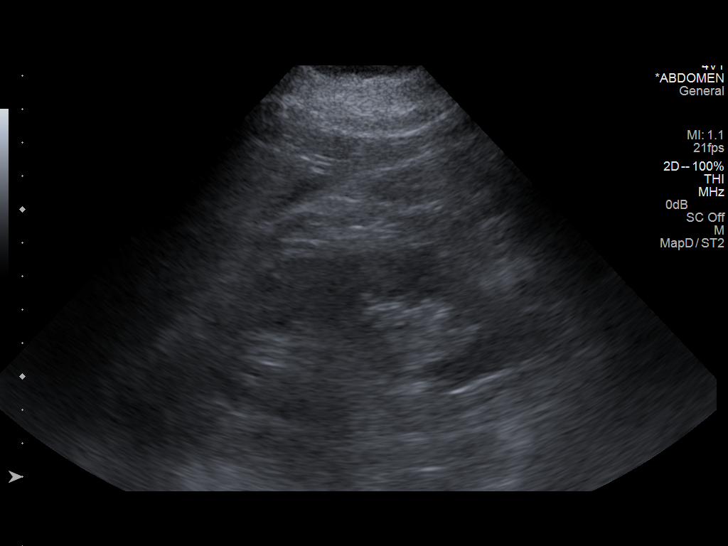
[im 41/50]
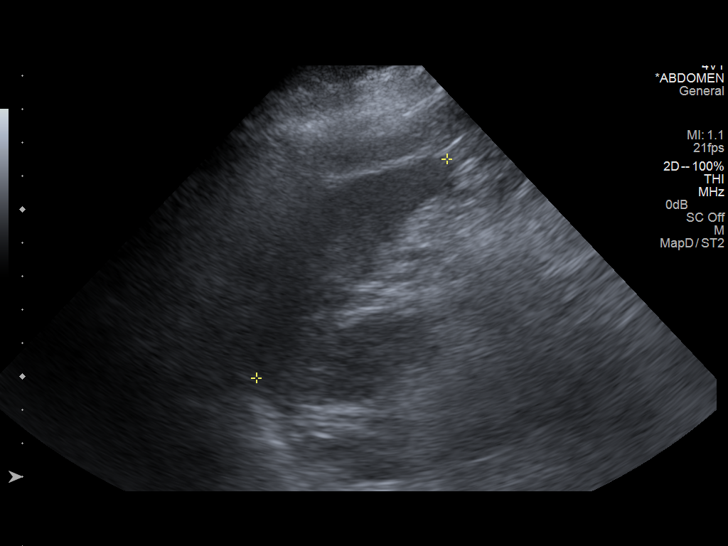
[im 45/50]
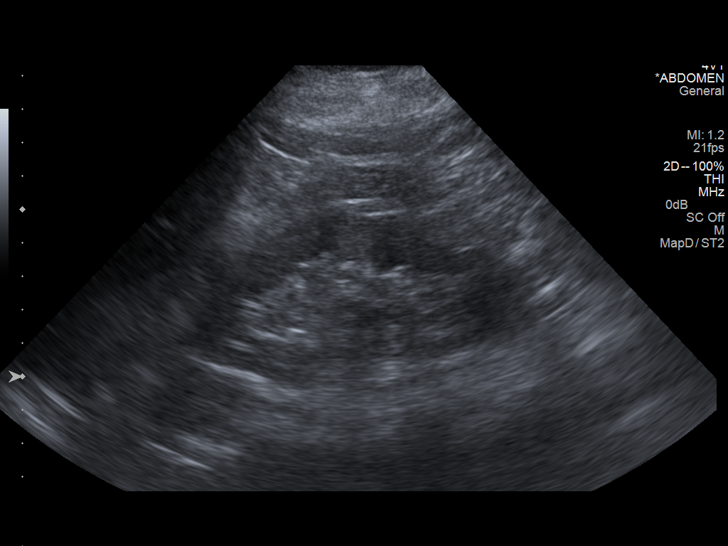
[im 50/50]
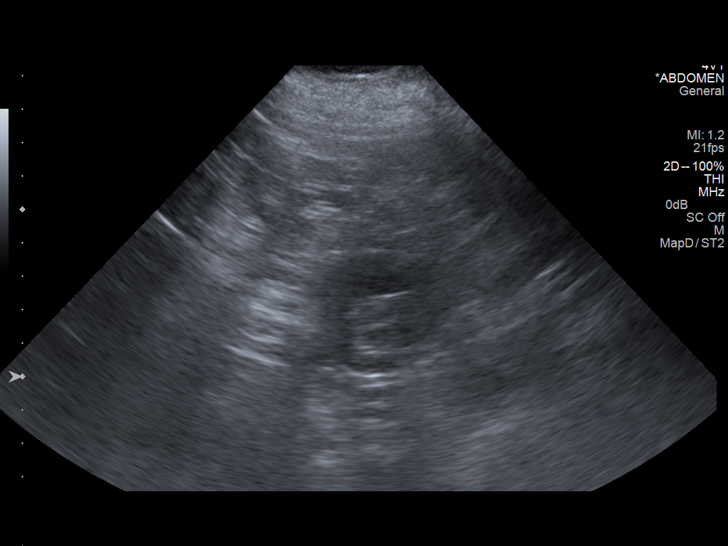

[13 of 25 positions shown; findings below may reference images not displayed]

FINDINGS: Gallbladder:

No gallstones or wall thickening visualized. No sonographic Murphy
sign noted.

Common bile duct:

Diameter: 4 mm

Liver:

Subtle focal hyperechogenicity along the gallbladder fossa, probably
from focal fatty infiltration but conceivably a hemangioma. Within
normal limits in parenchymal echogenicity.

IVC:

No abnormality visualized.

Pancreas:

Not well seen, possibly due to pancreatitis and/or overlying bowel
gas.

Spleen:

Size and appearance within normal limits.

Right Kidney:

Length: 9.4 cm. Echogenicity within normal limits. No mass or
hydronephrosis visualized.

Left Kidney:

Length: 10.2 cm. Echogenicity within normal limits. No mass or
hydronephrosis visualized.

Abdominal aorta:

No aneurysm visualized.

Other findings:

Trace ascites along the right hepatic lobe margin. Small right
pleural effusion.
IMPRESSION: 1. Poor visualization of the pancreas potentially due to
pancreatitis and/or overlying bowel gas. Trace ascites along the
adjacent liver edge.
2. Gallbladder within normal limits.
3. Focal hyperechogenicity in the liver along the gallbladder fossa,
probably from focal fatty infiltration, less likely a hemangioma.

## 2015-12-07 IMAGING — CR DG CHEST 1V PORT
1 series · 1 of 1 positions shown · non-contrast
Comparison: 05/07/2013

CLINICAL DATA: Fluid overload.

EXAM:
PORTABLE CHEST - 1 VIEW

[AP]
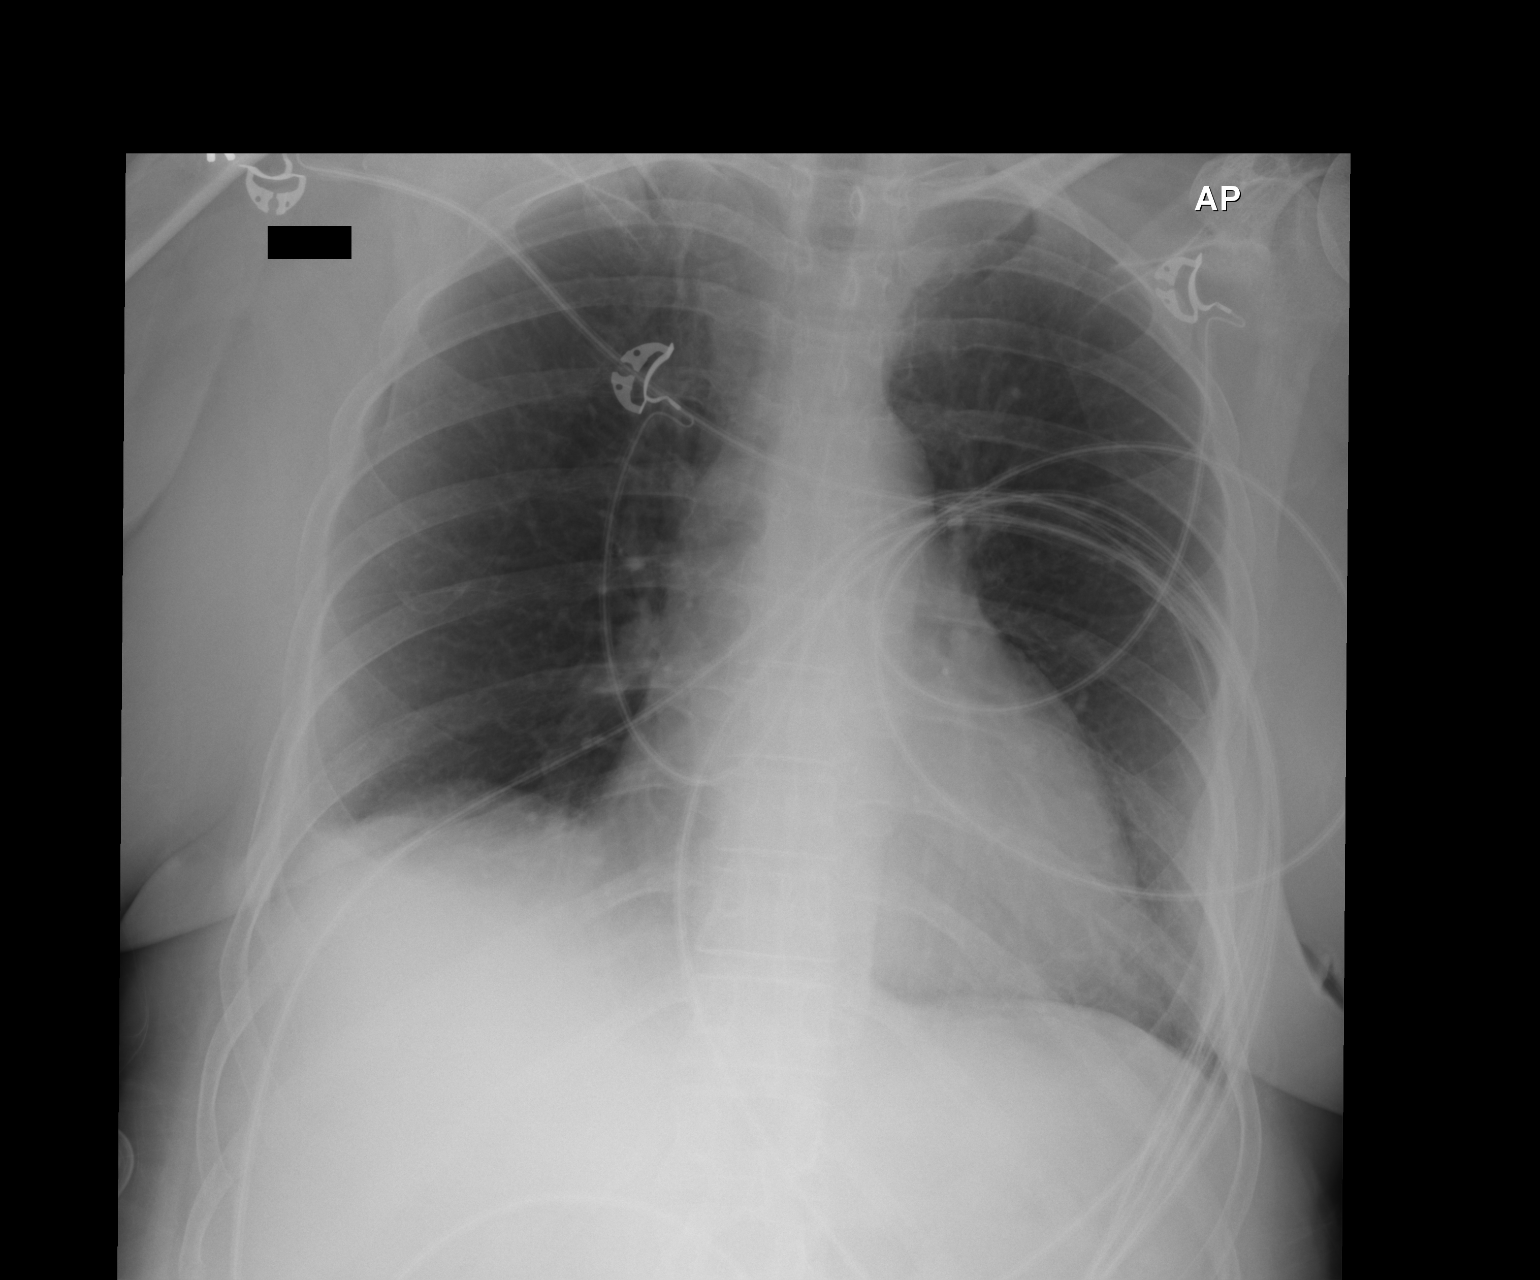

[1 of 1 positions shown; findings below may reference images not displayed]

FINDINGS: Cardiac silhouette remains mildly enlarged, unchanged. The patient
has taken a greater inspiration than on the prior study, and there
is improved aeration of the lung bases. No airspace consolidation,
pulmonary edema, pneumothorax, or definite pleural effusion is
identified. There is mild elevation of the right hemidiaphragm.
IMPRESSION: Improved aeration of the lung bases. No evidence of new airspace
disease.

## 2015-12-10 NOTE — Progress Notes (Signed)
Electrophysiology Office Note Date: 12/11/2015  ID:  [name], DOB 27-Aug-1955, MRN [id number]  PCP: Rosita Fire, MD Primary Cardiologist: Aundra Dubin Electrophysiologist: Allred  CC: 6 week post CRT   [name] is a 59 y.o. female seen today for Dr Rayann Heman.  She presents today for routine electrophysiology followup.  Since lCRT upgrade 10/2015, the patient reports doing very well. She feels like "she has a new heart".  She denies chest pain, palpitations, dyspnea, PND, orthopnea, nausea, vomiting, dizziness, syncope, edema, weight gain, or early satiety.  She has not had ICD shocks.   Device History: STJ single chamber ICD implanted 2016 for ICM; upgrade to CRTD 10/2015 History of appropriate therapy: No History of AAD therapy: No   Past Medical History:  Diagnosis Date  . Anemia   . Anxiety 2015   . CAD (coronary artery disease)    a. LHC (04/2013): Chronically occluded LAD, moderate dz in large OM1 and severe dz and small posterior lateral vessel    . chronic systolic heart failure    a. ECHO (05/08/13): EF 20-25%, diff HK with akinesis of basal inferior wall, grade 1 DD, trivial MR, trivial TR b) RHC (05/06/13): RA 7, RV 30/2, PA 31/19 (24), PCWP 10, Fick CO/CI: 2.9/1.74, Thermo CO/Ci: 2.4/1.4, PA sat 53%  . Depression   . Diverticulosis   . DNR (do not resuscitate) 06/05/2015   Confirmed with patient on 06/05/2015  . ETOH abuse 1981   started abusing alcohol at age 38, quit   . GERD (gastroesophageal reflux disease) 2015   . History of blood transfusion    "related to losing blood from somewhere; never found out from where"  . Hyperlipidemia 2015  . Hypertension 2015  . Hypothyroidism   . Ischemic cardiomyopathy   . Mass of right breast on mammogram 07/14/2014  . Pancreatitis 1980, 2015   in the setting of ETOH  . Persistent atrial fibrillation ([place]) 2015  . Psoriasis 1968  . Stroke [facility]) 2011   left side weakness, minimal  . Suicidal overdose ([place]) 10/03/13   Overdoes on Beta Blockers & Xarelto ([place])   Past Surgical History:  Procedure Laterality Date  . AGILE CAPSULE N/A 06/02/2014   Procedure: AGILE CAPSULE;  Surgeon: Danie Binder, MD;  Location: [facility] ENDO SUITE;  Service: Endoscopy;  Laterality: N/A;  0800  . BIOPSY N/A 11/22/2013   Procedure: GASTRIC BIOPSIES;  Surgeon: Danie Binder, MD;  Location: [facility] ORS;  Service: Endoscopy;  Laterality: N/A;  . COLONOSCOPY WITH PROPOFOL N/A 03/29/2014   SLF: 1. No definite source for anemia identified 2. 8 colon polyps removed 3. Severe diverticulosis noted the transverse colon  and right colon 4. Small internal hemorrohids.   . COLOSTOMY REVERSAL  ?2009  . EP IMPLANTABLE DEVICE N/A 09/13/2014   STJ single chamber ICD implanted by Dr Rayann Heman  . EP IMPLANTABLE DEVICE N/A 10/30/2015   STJ CRTD upgrade by Dr Rayann Heman  . ESOPHAGOGASTRODUODENOSCOPY (EGD) WITH PROPOFOL N/A 11/22/2013   Dr. Oneida Alar: gastritis and duodenitis, negative H.pylori  . GIVENS CAPSULE STUDY N/A 06/10/2014   Procedure: GIVENS CAPSULE STUDY;  Surgeon: Danie Binder, MD;  Location: [facility];  Service: Endoscopy;  Laterality: N/A;  0700  . ILEOSTOMY  ?2009  . ILEOSTOMY CLOSURE  ?2010  . LEFT AND RIGHT HEART CATHETERIZATION WITH CORONARY ANGIOGRAM N/A 05/10/2013   Procedure: LEFT AND RIGHT HEART CATHETERIZATION WITH CORONARY ANGIOGRAM;  Surgeon: [name], MD;  Location: [facility] CATH LAB;  Service: Cardiovascular;  Laterality: N/A;  . LEFT AND RIGHT HEART CATHETERIZATION WITH CORONARY ANGIOGRAM N/A 04/14/2014   Procedure: LEFT AND RIGHT HEART CATHETERIZATION WITH CORONARY ANGIOGRAM;  Surgeon: Larey Dresser, MD;  Location: [facility] CATH LAB;  Service: Cardiovascular;  Laterality: N/A;  . LEFT HEART CATHETERIZATION WITH CORONARY ANGIOGRAM N/A 09/03/2013   Procedure: LEFT HEART CATHETERIZATION WITH CORONARY ANGIOGRAM;  Surgeon: Sinclair Grooms, MD;  Location: [facility] CATH LAB;  Service: Cardiovascular;  Laterality: N/A;  . POLYPECTOMY N/A 03/29/2014    Procedure: POLYPECTOMY;  Surgeon: Danie Binder, MD;  Location: [facility] ORS;  Service: Endoscopy;  Laterality: N/A;  ascending colon, sigmoid colon x4, rectal x3    Current Outpatient Prescriptions  Medication Sig Dispense Refill  . acetaminophen (TYLENOL) 325 MG tablet Take 650 mg by mouth every 6 (six) hours as needed for mild pain.    Marland Kitchen ammonium lactate (AMLACTIN) 12 % cream Apply 1 g topically daily as needed for dry skin (apply to elbows).     Marland Kitchen atorvastatin (LIPITOR) 80 MG tablet TAKE 1 TABLET BY MOUTH ONCE DAILY. 30 tablet 3  . calcium carbonate (OS-CAL - DOSED IN MG OF ELEMENTAL CALCIUM) 1250 (500 Ca) MG tablet Take 1 tablet (500 mg of elemental calcium total) by mouth 3 (three) times daily with meals. 90 tablet 3  . carvedilol (COREG) 12.5 MG tablet TAKE 1 TABLET BY MOUTH TWICE DAILY. 60 tablet 3  . ezetimibe (ZETIA) 10 MG tablet TAKE 1 TABLET BY MOUTH ONCE DAILY. 30 tablet 3  . ibuprofen (ADVIL,MOTRIN) 800 MG tablet Take 800 mg by mouth every 6 (six) hours as needed for mild pain.    Marland Kitchen levothyroxine (SYNTHROID, LEVOTHROID) 25 MCG tablet TAKE 1/2 TABLET BY MOUTH BEFORE BREAKFAST. 15 tablet 3  . lisinopril (PRINIVIL,ZESTRIL) 2.5 MG tablet Take 1 tablet (2.5 mg total) by mouth daily. D/C order for Hydralazine 30 tablet 6  . LORazepam (ATIVAN) 0.5 MG tablet Take 1 tablet (0.5 mg total) by mouth 2 (two) times daily. 60 tablet 0  . nitroGLYCERIN (NITROSTAT) 0.3 MG SL tablet Place 0.3 mg under the tongue every 5 (five) minutes as needed for chest pain (MAX 3 TABLETS). Reported on 07/18/2015    . ondansetron (ZOFRAN) 4 MG tablet Take 4 mg by mouth every 8 (eight) hours as needed for nausea or vomiting. Reported on 07/18/2015    . pantoprazole (PROTONIX) 40 MG tablet TAKE 1 TABLET BY MOUTH TWICE DAILY BEFORE A MEAL. 60 tablet 3  . potassium chloride SA (K-DUR,KLOR-CON) 20 MEQ tablet TAKE 1 TABLET BY MOUTH ONCE DAILY. 30 tablet 3  . sertraline (ZOLOFT) 50 MG tablet TAKE 2 TABLETS (=TO A 100MG  DOSE) BY  MOUTH ONCE DAILY. 60 tablet 3  . torsemide (DEMADEX) 20 MG tablet Take 1 tablet (20 mg total) by mouth every other day. 15 tablet 2  . warfarin (JANTOVEN) 2.5 MG tablet Take 3.75 mg by mouth See admin instructions. Take 3.75 mg on Tuesday, Thursday, Saturdays and Sundays    . warfarin (JANTOVEN) 5 MG tablet Take 1 tablet (5 mg total) by mouth See admin instructions. Take 5 mg every Monday, Wednesday and Friday 20 tablet 3   No current facility-administered medications for this visit.     Allergies:   Morphine and related and Penicillins   Social History: Social History   Social History  . Marital status: Married    Spouse name: N/A  . Number of children: 2  . Years of education: 10   Occupational History  .  Unemployed     Social History Main Topics  . Smoking status: Current Some Day Smoker    Packs/day: 0.20    Years: 48.00    Types: Cigarettes  . Smokeless tobacco: Never Used  . Alcohol use No     Comment: last drink in [id number], former alcoholic  . Drug use: No  . Sexual activity: No   Other Topics Concern  . Not on file   Social History Narrative   Lived previously with her spouse in an [place].     Son and daughter   Daughter in [place]   Son in [place]   Two grandchildren.       Presently in a group home [facility], moved in 10/13/13.           Family History: Family History  Problem Relation Age of Onset  . CAD Father 8    MI  . CAD Mother 76    MI  . Alcohol abuse Mother   . Colon cancer Mother     in her 75s  . CAD Sister 77    Stent  . Diabetes Sister   . CAD Brother 49    MI  . Cancer Neg Hx     Review of Systems: All other systems reviewed and are otherwise negative except as noted above.   Physical Exam: VS:  BP (!) 80/50   Pulse 63   Ht 5\' 3"  (1.6 m)   Wt 123 lb (55.8 kg)   LMP 01/22/1988 (Approximate)   SpO2 98%   BMI 21.79 kg/m  , BMI Body mass index is 21.79 kg/m.  GEN- The patient is thin appearing, alert and  oriented x 3 today.   HEENT: normocephalic, atraumatic; sclera clear, conjunctiva pink; hearing intact; oropharynx clear; neck supple Lungs- Clear to ausculation bilaterally, normal work of breathing.  No wheezes, rales, rhonchi Heart- Regular rate and rhythm (paced) GI- soft, non-tender, non-distended, bowel sounds present Extremities- no clubbing, cyanosis, or edema; DP/PT/radial pulses 2+ bilaterally MS- no significant deformity or atrophy Skin- warm and dry, no rash or lesion; ICD pocket well healed Psych- euthymic mood, full affect Neuro- strength and sensation are intact  ICD interrogation- reviewed in detail today,  See PACEART report  EKG:  EKG is ordered today. The ekg ordered today shows AV pacing  Recent Labs: 02/15/2015: Magnesium 1.6; TSH 0.730 03/24/2015: ALT 31 03/29/2015: B Natriuretic Peptide 75.4 11/15/2015: BUN 29; Creatinine, Ser 1.80; Potassium 4.9; Sodium 141 11/20/2015: Hemoglobin 10.4; Platelets 230   Wt Readings from Last 3 Encounters:  12/11/15 123 lb (55.8 kg)  11/30/15 121 lb 9.6 oz (55.2 kg)  11/15/15 123 lb 4 oz (55.9 kg)     Other studies Reviewed: Additional studies/ records that were reviewed today include: hospital records, Dr Claris Gladden office notes, Dr Jackalyn Lombard notes  Assessment and Plan:  1.  Chronic systolic dysfunction euvolemic today BP low but stable for patient Improved after CRT upgrade Stable on an appropriate medical regimen Normal ICD function See Claudia Desanctis Art report No changes today Enroll in [facility] clinic Update echo 02/2016 - 6 months post CRT  2.  CAD No recent ischemic symptoms Continue medical therapy  3.  Persistent atrial fibrillation Burden by device interrogation 0% Continue Warfarin for CHADS2VASC of 6   Current medicines are reviewed at length with the patient today.   The patient does not have concerns regarding her medicines.  The following changes were made today:  none  Labs/ tests ordered today  include: echo  02/2016 Orders Placed This Encounter  Procedures  . EKG 12-Lead     Disposition:   Follow up with Dr Aundra Dubin as scheduled, ICM clinic, [place], Dr Allred 6 weeks   Signed, Chanetta Marshall, NP 12/11/2015 11:37 AM  [facility] [address] [place] [address] [phone] (office) [phone] (fax)

## 2015-12-11 ENCOUNTER — Encounter: Payer: Medicaid Other | Admitting: Nurse Practitioner

## 2015-12-11 ENCOUNTER — Encounter: Payer: Self-pay | Admitting: Nurse Practitioner

## 2015-12-11 ENCOUNTER — Ambulatory Visit (INDEPENDENT_AMBULATORY_CARE_PROVIDER_SITE_OTHER): Payer: Medicaid Other | Admitting: Nurse Practitioner

## 2015-12-11 VITALS — BP 80/50 | HR 63 | Ht 63.0 in | Wt 123.0 lb

## 2015-12-11 DIAGNOSIS — I2510 Atherosclerotic heart disease of native coronary artery without angina pectoris: Secondary | ICD-10-CM | POA: Diagnosis not present

## 2015-12-11 DIAGNOSIS — I5022 Chronic systolic (congestive) heart failure: Secondary | ICD-10-CM

## 2015-12-11 DIAGNOSIS — I4819 Other persistent atrial fibrillation: Secondary | ICD-10-CM

## 2015-12-11 DIAGNOSIS — I481 Persistent atrial fibrillation: Secondary | ICD-10-CM | POA: Diagnosis not present

## 2015-12-11 LAB — CUP PACEART INCLINIC DEVICE CHECK
Implantable Lead Implant Date: 20160823
Implantable Lead Implant Date: 20171009
Implantable Lead Location: 753860
MDC IDC LEAD IMPLANT DT: 20171009
MDC IDC LEAD LOCATION: 753858
MDC IDC LEAD LOCATION: 753859
MDC IDC PG IMPLANT DT: 20171009
MDC IDC PG SERIAL: 7382721
MDC IDC SESS DTM: 20171120122724

## 2015-12-11 NOTE — Addendum Note (Signed)
Addended by: Claude Manges on: 12/11/2015 11:45 AM   Modules accepted: Orders

## 2015-12-11 NOTE — Patient Instructions (Addendum)
Medication Instructions:   Your physician recommends that you continue on your current medications as directed. Please refer to the Current Medication list given to you today.   If you need a refill on your cardiac medications before your next appointment, please call your pharmacy.  Labwork: NONE ORDERED  TODAY    Testing/Procedures: IN FEB.Marland KitchenMarland KitchenYour physician has requested that you have an echocardiogram. Echocardiography is a painless test that uses sound waves to create images of your heart. It provides your doctor with information about the size and shape of your heart and how well your heart's chambers and valves are working. This procedure takes approximately one hour. There are no restrictions for this procedure.     Follow-Up: KEEP APPOINTMENT AS SCHEDULED   Any Other Special Instructions Will Be Listed Below (If Applicable).

## 2015-12-25 ENCOUNTER — Encounter: Payer: Self-pay | Admitting: Nurse Practitioner

## 2015-12-26 ENCOUNTER — Ambulatory Visit (INDEPENDENT_AMBULATORY_CARE_PROVIDER_SITE_OTHER): Payer: Medicaid Other | Admitting: *Deleted

## 2015-12-26 DIAGNOSIS — I480 Paroxysmal atrial fibrillation: Secondary | ICD-10-CM | POA: Diagnosis not present

## 2015-12-26 LAB — POCT INR: INR: 5.4

## 2015-12-28 ENCOUNTER — Telehealth: Payer: Self-pay | Admitting: Family Medicine

## 2015-12-28 ENCOUNTER — Ambulatory Visit (INDEPENDENT_AMBULATORY_CARE_PROVIDER_SITE_OTHER): Payer: Medicaid Other | Admitting: *Deleted

## 2015-12-28 DIAGNOSIS — I480 Paroxysmal atrial fibrillation: Secondary | ICD-10-CM | POA: Diagnosis not present

## 2015-12-28 DIAGNOSIS — N184 Chronic kidney disease, stage 4 (severe): Secondary | ICD-10-CM

## 2015-12-28 LAB — POCT INR: INR: 3.3

## 2015-12-28 MED ORDER — LISINOPRIL 2.5 MG PO TABS: 2.5000 mg | ORAL_TABLET | Freq: Every day | ORAL | 6 refills | Status: AC

## 2015-12-28 NOTE — Telephone Encounter (Signed)
Pt was called and informed of stage 4 CKD.

## 2015-12-28 NOTE — Telephone Encounter (Signed)
Please call patient Chronic kidney disease has progressed to stage 4 She has been referred to a kidney specialist  She is also advised to stop any NSAIDs- ibuprofen, aleve, etc.

## 2015-12-28 NOTE — Telephone Encounter (Signed)
-----   Message from [name], MD sent at 10/19/2015  7:52 AM EDT ----- Results reviewed.  Claiborne Billings, please inform pt of result. I will route to primary care also.

## 2015-12-28 NOTE — Assessment & Plan Note (Signed)
Progress of CKD in setting of CHF with hypotension Renal referral placed Dc al NSAIDs  Close monitoring of BP

## 2016-01-01 ENCOUNTER — Other Ambulatory Visit (HOSPITAL_COMMUNITY): Payer: Self-pay | Admitting: Oncology

## 2016-01-01 ENCOUNTER — Encounter (HOSPITAL_COMMUNITY): Payer: Medicaid Other | Attending: Hematology & Oncology

## 2016-01-01 DIAGNOSIS — D509 Iron deficiency anemia, unspecified: Secondary | ICD-10-CM | POA: Diagnosis present

## 2016-01-01 DIAGNOSIS — Z800 Family history of malignant neoplasm of digestive organs: Secondary | ICD-10-CM | POA: Insufficient documentation

## 2016-01-01 LAB — CBC WITH DIFFERENTIAL/PLATELET
BASOS ABS: 0.1 10*3/uL (ref 0.0–0.1)
BASOS PCT: 1 %
EOS ABS: 0.1 10*3/uL (ref 0.0–0.7)
EOS PCT: 2 %
HCT: 23.3 % — ABNORMAL LOW (ref 36.0–46.0)
Hemoglobin: 7.8 g/dL — ABNORMAL LOW (ref 12.0–15.0)
LYMPHS ABS: 1.4 10*3/uL (ref 0.7–4.0)
Lymphocytes Relative: 29 %
MCH: 31 pg (ref 26.0–34.0)
MCHC: 33.5 g/dL (ref 30.0–36.0)
MCV: 92.5 fL (ref 78.0–100.0)
Monocytes Absolute: 0.4 10*3/uL (ref 0.1–1.0)
Monocytes Relative: 8 %
Neutro Abs: 3 10*3/uL (ref 1.7–7.7)
Neutrophils Relative %: 60 %
PLATELETS: 210 10*3/uL (ref 150–400)
RBC: 2.52 MIL/uL — AB (ref 3.87–5.11)
RDW: 12.6 % (ref 11.5–15.5)
WBC: 4.9 10*3/uL (ref 4.0–10.5)

## 2016-01-01 LAB — FERRITIN: FERRITIN: 79 ng/mL (ref 11–307)

## 2016-01-01 NOTE — Progress Notes (Unsigned)
stool

## 2016-01-02 ENCOUNTER — Other Ambulatory Visit (HOSPITAL_COMMUNITY): Payer: Self-pay | Admitting: Oncology

## 2016-01-02 ENCOUNTER — Other Ambulatory Visit (HOSPITAL_COMMUNITY): Payer: Self-pay | Admitting: *Deleted

## 2016-01-02 DIAGNOSIS — D509 Iron deficiency anemia, unspecified: Secondary | ICD-10-CM

## 2016-01-03 ENCOUNTER — Encounter (HOSPITAL_COMMUNITY): Payer: Medicaid Other

## 2016-01-03 DIAGNOSIS — D509 Iron deficiency anemia, unspecified: Secondary | ICD-10-CM | POA: Diagnosis not present

## 2016-01-03 LAB — PREPARE RBC (CROSSMATCH)

## 2016-01-04 ENCOUNTER — Encounter (HOSPITAL_COMMUNITY): Payer: Medicaid Other

## 2016-01-06 LAB — TYPE AND SCREEN
ABO/RH(D): A NEG
ANTIBODY SCREEN: NEGATIVE
UNIT DIVISION: 0
UNIT DIVISION: 0

## 2016-01-09 ENCOUNTER — Telehealth: Payer: Self-pay

## 2016-01-09 NOTE — Telephone Encounter (Signed)
Called [facility] to follow-up on referral for Esophageal Manometry (GE reflux). Pt has appt 02/01/16.

## 2016-01-10 ENCOUNTER — Ambulatory Visit (INDEPENDENT_AMBULATORY_CARE_PROVIDER_SITE_OTHER): Payer: Medicaid Other

## 2016-01-10 ENCOUNTER — Telehealth: Payer: Self-pay | Admitting: Cardiology

## 2016-01-10 DIAGNOSIS — I5022 Chronic systolic (congestive) heart failure: Secondary | ICD-10-CM | POA: Diagnosis not present

## 2016-01-10 DIAGNOSIS — Z95810 Presence of automatic (implantable) cardiac defibrillator: Secondary | ICD-10-CM

## 2016-01-10 NOTE — Telephone Encounter (Signed)
Spoke with pt and reminded pt of remote transmission that is due today. Pt verbalized understanding.   

## 2016-01-11 NOTE — Progress Notes (Signed)
EPIC Encounter for ICM Monitoring  Patient Name: [name] is a 60 y.o. female Date: 01/11/2016 Primary Care Physican: FANTA,TESFAYE, MD Primary Cardiologist: McLean Electrophysiologist: Allred Dry Weight:    unknown Bi-V Pacing: >99%  Attempted ICM call and unable to reach.  Left detailed message regarding transmission.  Transmission reviewed.   Thoracic impedance normal.  Was abnormal suggesting fluid accumulation from 12/5 to 12/14.  Labs: 10//25/2017 Creatinine 1.80, BUN 29, Potassium 4.9, Sodium 141, EGFR 29-34 10/31/2015 Creatinine 1.62, BUN 27, Potassium 4.5, Sodium 138, EGFR 33-39  10/18/2015 Creatinine 2.40, BUN 34, Potassium 4.8, Sodium 138, EGFR 21-24  09/28/2015 Creatinine 2.43, BUN 41, Potassium 4.6, Sodium 137, EGFR 21-24  09/18/2015 Creatinine 2.05, BUN 29, Potassium 4.3, Sodium 141, EGFR 25-29  07/18/2015 Creatinine 1.96, BUN 22, Potassium 4.4, Sodium 141, EGFR 27-31  03/29/2015 Creatinine 2.28, BUN 35, Potassium 3.4, Sodium 141, EGFR 22-26  02/28/2015 Creatinine 5.09, BUN 43, Potassium 3.8, Sodium 144, EGFR 8-10  02/24/2015 Creatinine 5.48, BUN 59, Potassium 5.6, Sodium 145, EGFR 8-9  02/23/2015 Creatinine 5.56, BUN 60, Potassium 4.4, Sodium 143, EGFR 8-9 02/22/2015 Creatinine 5.44, BUN 60, Potassium 4.0, Sodium 146, EGFR 8-9  02/21/2015 Creatinine 5.59, BUN 61, Potassium 3.1, Sodium 140, EGFR 7-9  02/20/2015 Creatinine 5.57, BUN 58, Potassium 4.3, Sodium 139, EGFR 8-9   Recommendations:  Left ICM direct number and encouraged to call for fluid symptoms.   Follow-up plan: ICM clinic phone appointment on 03/05/2016. Office appointment with HF clinic on 01/23/2016 and Dr Allred 01/31/2016  Copy of ICM check sent to device physician.   ICM trend: 01/11/2016       Laurie S Short, RN 01/11/2016 10:36 AM    

## 2016-01-20 ENCOUNTER — Other Ambulatory Visit: Payer: Self-pay

## 2016-01-20 ENCOUNTER — Encounter (HOSPITAL_COMMUNITY): Payer: Self-pay | Admitting: *Deleted

## 2016-01-20 ENCOUNTER — Inpatient Hospital Stay (HOSPITAL_COMMUNITY)
Admission: EM | Admit: 2016-01-20 | Discharge: 2016-01-25 | DRG: 378 | Disposition: A | Discharge status: Home / Self Care | Payer: Medicaid Other | Attending: Internal Medicine | Admitting: Internal Medicine

## 2016-01-20 ENCOUNTER — Emergency Department (HOSPITAL_COMMUNITY): Payer: Medicaid Other

## 2016-01-20 DIAGNOSIS — R195 Other fecal abnormalities: Secondary | ICD-10-CM | POA: Diagnosis not present

## 2016-01-20 DIAGNOSIS — F17210 Nicotine dependence, cigarettes, uncomplicated: Secondary | ICD-10-CM | POA: Diagnosis present

## 2016-01-20 DIAGNOSIS — I5022 Chronic systolic (congestive) heart failure: Secondary | ICD-10-CM | POA: Diagnosis present

## 2016-01-20 DIAGNOSIS — Z66 Do not resuscitate: Secondary | ICD-10-CM | POA: Diagnosis present

## 2016-01-20 DIAGNOSIS — R791 Abnormal coagulation profile: Secondary | ICD-10-CM | POA: Diagnosis present

## 2016-01-20 DIAGNOSIS — Z7901 Long term (current) use of anticoagulants: Secondary | ICD-10-CM | POA: Diagnosis not present

## 2016-01-20 DIAGNOSIS — I252 Old myocardial infarction: Secondary | ICD-10-CM

## 2016-01-20 DIAGNOSIS — I255 Ischemic cardiomyopathy: Secondary | ICD-10-CM | POA: Diagnosis not present

## 2016-01-20 DIAGNOSIS — N183 Chronic kidney disease, stage 3 unspecified: Secondary | ICD-10-CM | POA: Diagnosis present

## 2016-01-20 DIAGNOSIS — K449 Diaphragmatic hernia without obstruction or gangrene: Secondary | ICD-10-CM | POA: Diagnosis present

## 2016-01-20 DIAGNOSIS — I2510 Atherosclerotic heart disease of native coronary artery without angina pectoris: Secondary | ICD-10-CM | POA: Diagnosis present

## 2016-01-20 DIAGNOSIS — Z8673 Personal history of transient ischemic attack (TIA), and cerebral infarction without residual deficits: Secondary | ICD-10-CM

## 2016-01-20 DIAGNOSIS — I130 Hypertensive heart and chronic kidney disease with heart failure and stage 1 through stage 4 chronic kidney disease, or unspecified chronic kidney disease: Secondary | ICD-10-CM | POA: Diagnosis present

## 2016-01-20 DIAGNOSIS — E785 Hyperlipidemia, unspecified: Secondary | ICD-10-CM | POA: Diagnosis present

## 2016-01-20 DIAGNOSIS — F329 Major depressive disorder, single episode, unspecified: Secondary | ICD-10-CM | POA: Diagnosis present

## 2016-01-20 DIAGNOSIS — N184 Chronic kidney disease, stage 4 (severe): Secondary | ICD-10-CM

## 2016-01-20 DIAGNOSIS — Z95810 Presence of automatic (implantable) cardiac defibrillator: Secondary | ICD-10-CM

## 2016-01-20 DIAGNOSIS — Z79899 Other long term (current) drug therapy: Secondary | ICD-10-CM

## 2016-01-20 DIAGNOSIS — E039 Hypothyroidism, unspecified: Secondary | ICD-10-CM | POA: Diagnosis present

## 2016-01-20 DIAGNOSIS — K219 Gastro-esophageal reflux disease without esophagitis: Secondary | ICD-10-CM | POA: Diagnosis present

## 2016-01-20 DIAGNOSIS — R531 Weakness: Secondary | ICD-10-CM | POA: Diagnosis present

## 2016-01-20 DIAGNOSIS — D649 Anemia, unspecified: Secondary | ICD-10-CM | POA: Diagnosis present

## 2016-01-20 DIAGNOSIS — K2970 Gastritis, unspecified, without bleeding: Secondary | ICD-10-CM | POA: Diagnosis present

## 2016-01-20 DIAGNOSIS — K921 Melena: Principal | ICD-10-CM | POA: Diagnosis present

## 2016-01-20 DIAGNOSIS — I10 Essential (primary) hypertension: Secondary | ICD-10-CM

## 2016-01-20 DIAGNOSIS — D500 Iron deficiency anemia secondary to blood loss (chronic): Secondary | ICD-10-CM | POA: Diagnosis present

## 2016-01-20 DIAGNOSIS — I482 Chronic atrial fibrillation: Secondary | ICD-10-CM | POA: Diagnosis present

## 2016-01-20 DIAGNOSIS — I959 Hypotension, unspecified: Secondary | ICD-10-CM | POA: Diagnosis present

## 2016-01-20 DIAGNOSIS — E038 Other specified hypothyroidism: Secondary | ICD-10-CM

## 2016-01-20 DIAGNOSIS — F1011 Alcohol abuse, in remission: Secondary | ICD-10-CM | POA: Diagnosis present

## 2016-01-20 DIAGNOSIS — D509 Iron deficiency anemia, unspecified: Secondary | ICD-10-CM

## 2016-01-20 DIAGNOSIS — K222 Esophageal obstruction: Secondary | ICD-10-CM | POA: Diagnosis present

## 2016-01-20 DIAGNOSIS — I480 Paroxysmal atrial fibrillation: Secondary | ICD-10-CM

## 2016-01-20 LAB — COMPREHENSIVE METABOLIC PANEL
ALT: 19 U/L (ref 14–54)
AST: 23 U/L (ref 15–41)
Albumin: 4.1 g/dL (ref 3.5–5.0)
Alkaline Phosphatase: 43 U/L (ref 38–126)
Anion gap: 7 (ref 5–15)
BUN: 52 mg/dL — ABNORMAL HIGH (ref 6–20)
CALCIUM: 9.4 mg/dL (ref 8.9–10.3)
CO2: 23 mmol/L (ref 22–32)
CREATININE: 1.95 mg/dL — AB (ref 0.44–1.00)
Chloride: 108 mmol/L (ref 101–111)
GFR, EST AFRICAN AMERICAN: 31 mL/min — AB (ref >60)
GFR, EST NON AFRICAN AMERICAN: 27 mL/min — AB (ref >60)
Glucose, Bld: 112 mg/dL — ABNORMAL HIGH (ref 65–99)
Potassium: 4.8 mmol/L (ref 3.5–5.1)
Sodium: 138 mmol/L (ref 135–145)
TOTAL PROTEIN: 7.3 g/dL (ref 6.5–8.1)
Total Bilirubin: 0.4 mg/dL (ref 0.3–1.2)

## 2016-01-20 LAB — CBC WITH DIFFERENTIAL/PLATELET
BASOS ABS: 0 10*3/uL (ref 0.0–0.1)
Basophils Relative: 1 %
EOS PCT: 2 %
Eosinophils Absolute: 0.1 10*3/uL (ref 0.0–0.7)
HEMATOCRIT: 17.5 % — AB (ref 36.0–46.0)
Hemoglobin: 5.7 g/dL — CL (ref 12.0–15.0)
LYMPHS ABS: 1.6 10*3/uL (ref 0.7–4.0)
LYMPHS PCT: 27 %
MCH: 30.5 pg (ref 26.0–34.0)
MCHC: 32.6 g/dL (ref 30.0–36.0)
MCV: 93.6 fL (ref 78.0–100.0)
Monocytes Absolute: 0.3 10*3/uL (ref 0.1–1.0)
Monocytes Relative: 5 %
NEUTROS ABS: 4 10*3/uL (ref 1.7–7.7)
Neutrophils Relative %: 66 %
PLATELETS: 252 10*3/uL (ref 150–400)
RBC: 1.87 MIL/uL — AB (ref 3.87–5.11)
RDW: 14.3 % (ref 11.5–15.5)
WBC: 6.1 10*3/uL (ref 4.0–10.5)

## 2016-01-20 LAB — URINALYSIS, ROUTINE W REFLEX MICROSCOPIC
Bacteria, UA: NONE SEEN
Bilirubin Urine: NEGATIVE
GLUCOSE, UA: NEGATIVE mg/dL
HGB URINE DIPSTICK: NEGATIVE
Ketones, ur: NEGATIVE mg/dL
NITRITE: NEGATIVE
PROTEIN: NEGATIVE mg/dL
SPECIFIC GRAVITY, URINE: 1.008 (ref 1.005–1.030)
pH: 5 (ref 5.0–8.0)

## 2016-01-20 LAB — I-STAT TROPONIN, ED: TROPONIN I, POC: 0 ng/mL (ref 0.00–0.08)

## 2016-01-20 LAB — MRSA PCR SCREENING: MRSA by PCR: NEGATIVE

## 2016-01-20 LAB — PROTIME-INR
INR: 10.22 — AB
Prothrombin Time: 84.7 seconds — ABNORMAL HIGH (ref 11.4–15.2)

## 2016-01-20 LAB — PREPARE RBC (CROSSMATCH)

## 2016-01-20 LAB — POC OCCULT BLOOD, ED: FECAL OCCULT BLD: POSITIVE — AB

## 2016-01-20 MED ORDER — ATORVASTATIN CALCIUM 40 MG PO TABS
80.0000 mg | ORAL_TABLET | Freq: Every day | ORAL | Status: DC
  Administered 2016-01-21 – 2016-01-25 (×5): 80 mg via ORAL
  Filled 2016-01-20 (×5): qty 2

## 2016-01-20 MED ORDER — SERTRALINE HCL 50 MG PO TABS
100.0000 mg | ORAL_TABLET | Freq: Every day | ORAL | Status: DC
  Administered 2016-01-20 – 2016-01-24 (×5): 100 mg via ORAL
  Filled 2016-01-20 (×5): qty 2

## 2016-01-20 MED ORDER — POTASSIUM CHLORIDE CRYS ER 20 MEQ PO TBCR
20.0000 meq | EXTENDED_RELEASE_TABLET | Freq: Every day | ORAL | Status: DC
  Administered 2016-01-20 – 2016-01-25 (×6): 20 meq via ORAL
  Filled 2016-01-20 (×6): qty 1

## 2016-01-20 MED ORDER — LEVOTHYROXINE SODIUM 25 MCG PO TABS
12.5000 ug | ORAL_TABLET | Freq: Every day | ORAL | Status: DC
  Administered 2016-01-21 – 2016-01-25 (×5): 12.5 ug via ORAL
  Filled 2016-01-20 (×5): qty 1

## 2016-01-20 MED ORDER — PANTOPRAZOLE SODIUM 40 MG IV SOLR
40.0000 mg | Freq: Once | INTRAVENOUS | Status: AC
  Administered 2016-01-20: 40 mg via INTRAVENOUS
  Filled 2016-01-20: qty 40

## 2016-01-20 MED ORDER — ONDANSETRON HCL 4 MG/2ML IJ SOLN: 4.0000 mg | Freq: Four times a day (QID) | INTRAMUSCULAR | Status: DC | PRN

## 2016-01-20 MED ORDER — CLONAZEPAM 0.5 MG PO TABS
0.5000 mg | ORAL_TABLET | Freq: Two times a day (BID) | ORAL | Status: DC
  Administered 2016-01-20 – 2016-01-25 (×10): 0.5 mg via ORAL
  Filled 2016-01-20 (×10): qty 1

## 2016-01-20 MED ORDER — ONDANSETRON HCL 4 MG PO TABS: 4.0000 mg | ORAL_TABLET | Freq: Four times a day (QID) | ORAL | Status: DC | PRN

## 2016-01-20 MED ORDER — ONDANSETRON HCL 4 MG/2ML IJ SOLN
4.0000 mg | Freq: Once | INTRAMUSCULAR | Status: AC
  Administered 2016-01-20: 4 mg via INTRAVENOUS
  Filled 2016-01-20: qty 2

## 2016-01-20 MED ORDER — EZETIMIBE 10 MG PO TABS
10.0000 mg | ORAL_TABLET | Freq: Every day | ORAL | Status: DC
  Administered 2016-01-21 – 2016-01-25 (×5): 10 mg via ORAL
  Filled 2016-01-20 (×5): qty 1

## 2016-01-20 MED ORDER — ONDANSETRON HCL 4 MG PO TABS: 4.0000 mg | ORAL_TABLET | Freq: Three times a day (TID) | ORAL | Status: DC | PRN

## 2016-01-20 MED ORDER — SODIUM CHLORIDE 0.9 % IV SOLN
10.0000 mL/h | Freq: Once | INTRAVENOUS | Status: AC
  Administered 2016-01-20: 10 mL/h via INTRAVENOUS

## 2016-01-20 MED ORDER — AMMONIUM LACTATE 12 % EX LOTN
1.0000 "application " | TOPICAL_LOTION | Freq: Every day | CUTANEOUS | Status: DC | PRN
  Filled 2016-01-20: qty 400

## 2016-01-20 MED ORDER — TORSEMIDE 20 MG PO TABS
20.0000 mg | ORAL_TABLET | ORAL | Status: DC
  Administered 2016-01-22 – 2016-01-24 (×2): 20 mg via ORAL
  Filled 2016-01-20 (×3): qty 1

## 2016-01-20 MED ORDER — ACETAMINOPHEN 650 MG RE SUPP: 650.0000 mg | Freq: Four times a day (QID) | RECTAL | Status: DC | PRN

## 2016-01-20 MED ORDER — VITAMIN K1 10 MG/ML IJ SOLN
10.0000 mg | INTRAVENOUS | Status: AC
  Administered 2016-01-20: 10 mg via INTRAVENOUS
  Filled 2016-01-20: qty 1

## 2016-01-20 MED ORDER — SODIUM CHLORIDE 0.9 % IV BOLUS (SEPSIS)
500.0000 mL | Freq: Once | INTRAVENOUS | Status: AC
  Administered 2016-01-20: 500 mL via INTRAVENOUS

## 2016-01-20 MED ORDER — PANTOPRAZOLE SODIUM 40 MG PO TBEC
40.0000 mg | DELAYED_RELEASE_TABLET | Freq: Two times a day (BID) | ORAL | Status: DC
  Administered 2016-01-21 (×2): 40 mg via ORAL
  Filled 2016-01-20 (×2): qty 1

## 2016-01-20 MED ORDER — ACETAMINOPHEN 325 MG PO TABS
650.0000 mg | ORAL_TABLET | Freq: Four times a day (QID) | ORAL | Status: DC | PRN
  Administered 2016-01-24: 650 mg via ORAL
  Filled 2016-01-20: qty 2

## 2016-01-20 MED ORDER — SODIUM CHLORIDE 0.9 % IV BOLUS (SEPSIS)
250.0000 mL | Freq: Once | INTRAVENOUS | Status: AC
  Administered 2016-01-20: 250 mL via INTRAVENOUS

## 2016-01-20 MED ORDER — VITAMIN K1 10 MG/ML IJ SOLN
INTRAMUSCULAR | Status: AC
  Filled 2016-01-20: qty 1

## 2016-01-20 MED ORDER — PROTHROMBIN COMPLEX CONC HUMAN 500 UNITS IV KIT
50.0000 [IU]/kg | PACK | INTRAVENOUS | Status: AC
  Administered 2016-01-20: 2800 [IU] via INTRAVENOUS
  Filled 2016-01-20: qty 112

## 2016-01-20 NOTE — ED Notes (Signed)
HGB-5.7.

## 2016-01-20 NOTE — ED Provider Notes (Signed)
[facility] DEPT Provider Note   CSN: [id number] Arrival date & time: 01/20/16  1656     History   Chief Complaint Chief Complaint  Patient presents with  . Illness    HPI [name] is a 60 y.o. female.  Patient states that she has felt weak for a few days now. Patient has a history of congestive heart failure and anemia. She also has a pacemaker   The history is provided by the patient. No language interpreter was used.  Illness  This is a new problem. The current episode started more than 2 days ago. The problem occurs constantly. The problem has not changed since onset.Pertinent negatives include no chest pain, no abdominal pain and no headaches. Nothing aggravates the symptoms. Nothing relieves the symptoms. She has tried nothing for the symptoms. The treatment provided no relief.    Past Medical History:  Diagnosis Date  . Anemia   . Anxiety 2015   . CAD (coronary artery disease)    a. LHC (04/2013): Chronically occluded LAD, moderate dz in large OM1 and severe dz and small posterior lateral vessel    . chronic systolic heart failure    a. ECHO (05/08/13): EF 20-25%, diff HK with akinesis of basal inferior wall, grade 1 DD, trivial MR, trivial TR b) RHC (05/06/13): RA 7, RV 30/2, PA 31/19 (24), PCWP 10, Fick CO/CI: 2.9/1.74, Thermo CO/Ci: 2.4/1.4, PA sat 53%  . Depression   . Diverticulosis   . DNR (do not resuscitate) 06/05/2015   Confirmed with patient on 06/05/2015  . ETOH abuse 1981   started abusing alcohol at age 56, quit   . GERD (gastroesophageal reflux disease) 2015   . History of blood transfusion    "related to losing blood from somewhere; never found out from where"  . Hyperlipidemia 2015  . Hypertension 2015  . Hypothyroidism   . Ischemic cardiomyopathy   . Mass of right breast on mammogram 07/14/2014  . Pancreatitis 1980, 2015   in the setting of ETOH  . Persistent atrial fibrillation ([place]) 2015  . Psoriasis 1968  . Stroke [facility]) 2011   left  side weakness, minimal  . Suicidal overdose ([place]) 10/03/13   Overdoes on Beta Blockers & Xarelto [facility] ED)    Patient Active Problem List   Diagnosis Date Noted  . Anemia 01/20/2016  . Abdominal pain, epigastric 11/30/2015  . Chronic systolic dysfunction of left ventricle 10/30/2015  . DNR (do not resuscitate) 06/05/2015  . Chronic systolic heart failure ([place]) 05/17/2015  . Paroxysmal a-fib ([place]) 03/30/2015  . CKD (chronic kidney disease) stage 5, GFR less than 15 ml/min (HCC) 03/08/2015  . Arm edema   . Low oxygen saturation   . Palliative care encounter   . NSTEMI (non-ST elevated myocardial infarction) ([place]) 10/16/2014  . S/P ICD (internal cardiac defibrillator) procedure, [employer] 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 Ischemic cardiomyopathy 09/13/2014  . Mass of right breast on mammogram 07/14/2014  . CHF (congestive heart failure) ([place]) 04/09/2014  . CHF exacerbation ([place])   . Iron deficiency anemia 03/01/2014  . Gastritis and gastroduodenitis 02/28/2014  . Cervical cancer screening 12/30/2013  . Hypothyroidism 12/30/2013  . FH: colon cancer 11/18/2013  . Psoriasis 10/22/2013  . Chronic kidney disease, stage 4 (severe) ([place]) 10/22/2013  . Suicide attempt Sept 2015 10/03/2013  . Anxiety state 09/24/2013  . Cardiomyopathy, ischemic- EF 25-30% 2D 09/02/13 09/03/2013  . CAD- total LAD- residual OM2 disease 09/03/2013  . H/O PAF 06/06/2013  . Tobacco abuse  05/09/2013  . Family history of premature CAD 05/09/2013  . H/O alcohol abuse 05/06/2013  . Hypertension   . GERD (gastroesophageal reflux disease)     Past Surgical History:  Procedure Laterality Date  . AGILE CAPSULE N/A 06/02/2014   Procedure: AGILE CAPSULE;  Surgeon: Danie Binder, MD;  Location: [facility] ENDO SUITE;  Service: Endoscopy;  Laterality: N/A;  0800  . BIOPSY N/A 11/22/2013   Procedure: GASTRIC BIOPSIES;  Surgeon: Danie Binder, MD;  Location: [facility] ORS;  Service: Endoscopy;  Laterality: N/A;  . COLONOSCOPY WITH  PROPOFOL N/A 03/29/2014   SLF: 1. No definite source for anemia identified 2. 8 colon polyps removed 3. Severe diverticulosis noted the transverse colon  and right colon 4. Small internal hemorrohids.   . COLOSTOMY REVERSAL  ?2009  . EP IMPLANTABLE DEVICE N/A 09/13/2014   STJ single chamber ICD implanted by Dr Rayann Heman  . EP IMPLANTABLE DEVICE N/A 10/30/2015   STJ CRTD upgrade by Dr Rayann Heman  . ESOPHAGOGASTRODUODENOSCOPY (EGD) WITH PROPOFOL N/A 11/22/2013   Dr. Oneida Alar: gastritis and duodenitis, negative H.pylori  . GIVENS CAPSULE STUDY N/A 06/10/2014   Procedure: GIVENS CAPSULE STUDY;  Surgeon: Danie Binder, MD;  Location: [facility] END[facility];  Service: Endoscopy;  Laterality: N/A;  0700  . ILEOSTOMY  ?2009  . ILEOSTOMY CLOSURE  ?2010  . LEFT AND RIGHT HEART CATHETERIZATION WITH CORONARY ANGIOGRAM N/A 05/10/2013   Procedure: LEFT AND RIGHT HEART CATHETERIZATION WITH CORONARY ANGIOGRAM;  Surgeon: [name], MD;  Location: [facility] CATH LAB;  Service: Cardiovascular;  Laterality: N/A;  . LEFT AND RIGHT HEART CATHETERIZATION WITH CORONARY ANGIOGRAM N/A 04/14/2014   Procedure: LEFT AND RIGHT HEART CATHETERIZATION WITH CORONARY ANGIOGRAM;  Surgeon: Larey Dresser, MD;  Location: [facility] CATH LAB;  Service: Cardiovascular;  Laterality: N/A;  . LEFT HEART CATHETERIZATION WITH CORONARY ANGIOGRAM N/A 09/03/2013   Procedure: LEFT HEART CATHETERIZATION WITH CORONARY ANGIOGRAM;  Surgeon: Sinclair Grooms, MD;  Location: [facility] CATH LAB;  Service: Cardiovascular;  Laterality: N/A;  . POLYPECTOMY N/A 03/29/2014   Procedure: POLYPECTOMY;  Surgeon: Danie Binder, MD;  Location: [facility] ORS;  Service: Endoscopy;  Laterality: N/A;  ascending colon, sigmoid colon x4, rectal x3    OB History    No data available       Home Medications    Prior to Admission medications   Medication Sig Start Date End Date Taking? Authorizing Provider  acetaminophen (TYLENOL) 325 MG tablet Take 650 mg by mouth every 6 (six) hours as needed for mild  pain.   Yes Historical Provider, MD  ammonium lactate (AMLACTIN) 12 % cream Apply 1 g topically daily as needed for dry skin (apply to elbows).    Yes Historical Provider, MD  atorvastatin (LIPITOR) 80 MG tablet TAKE 1 TABLET BY MOUTH ONCE DAILY. 10/23/15  Yes Larey Dresser, MD  calcium carbonate (OS-CAL - DOSED IN MG OF ELEMENTAL CALCIUM) 1250 (500 Ca) MG tablet Take 1 tablet (500 mg of elemental calcium total) by mouth 3 (three) times daily with meals. 02/24/15  Yes Rexene Alberts, MD  carvedilol (COREG) 12.5 MG tablet TAKE 1 TABLET BY MOUTH TWICE DAILY. 10/23/15  Yes Larey Dresser, MD  clonazePAM (KLONOPIN) 0.5 MG tablet Take 0.5 mg by mouth 2 (two) times daily.   Yes Historical Provider, MD  ezetimibe (ZETIA) 10 MG tablet TAKE 1 TABLET BY MOUTH ONCE DAILY. 10/23/15  Yes Larey Dresser, MD  levothyroxine (SYNTHROID, LEVOTHROID) 25 MCG tablet TAKE 1/2 TABLET  BY MOUTH BEFORE BREAKFAST. 10/23/15  Yes Larey Dresser, MD  lisinopril (PRINIVIL,ZESTRIL) 2.5 MG tablet Take 1 tablet (2.5 mg total) by mouth daily. D/C order for Hydralazine 12/28/15 03/27/16 Yes Josalyn Funches, MD  nitroGLYCERIN (NITROSTAT) 0.3 MG SL tablet Place 0.3 mg under the tongue every 5 (five) minutes as needed for chest pain (MAX 3 TABLETS). Reported on 07/18/2015   Yes Historical Provider, MD  ondansetron (ZOFRAN) 4 MG tablet Take 4 mg by mouth every 8 (eight) hours as needed for nausea or vomiting. Reported on 07/18/2015   Yes Historical Provider, MD  pantoprazole (PROTONIX) 40 MG tablet TAKE 1 TABLET BY MOUTH TWICE DAILY BEFORE A MEAL. 10/23/15  Yes Larey Dresser, MD  potassium chloride SA (K-DUR,KLOR-CON) 20 MEQ tablet TAKE 1 TABLET BY MOUTH ONCE DAILY. 10/23/15  Yes Larey Dresser, MD  sertraline (ZOLOFT) 50 MG tablet TAKE 2 TABLETS (=TO A 100MG  DOSE) BY MOUTH ONCE DAILY. 10/23/15  Yes Larey Dresser, MD  torsemide (DEMADEX) 20 MG tablet Take 1 tablet (20 mg total) by mouth every other day. 09/29/15  Yes Larey Dresser, MD  warfarin  (COUMADIN) 2.5 MG tablet Take 2.5 mg by mouth daily. Take 1 and 1/2 daily on Tuesday, Thursday , Saturday and Sunday only   Yes Historical Provider, MD  warfarin (COUMADIN) 5 MG tablet Take 5 mg by mouth daily. Take on Monday Wednesday and friday   Yes Historical Provider, MD  warfarin (COUMADIN) 7.5 MG tablet Take 3.75 mg by mouth daily.   Yes Historical Provider, MD    Family History Family History  Problem Relation Age of Onset  . CAD Father 18    MI  . CAD Mother 63    MI  . Alcohol abuse Mother   . Colon cancer Mother     in her 41s  . CAD Sister 49    Stent  . Diabetes Sister   . CAD Brother 23    MI  . Cancer Neg Hx     Social History Social History  Substance Use Topics  . Smoking status: Current Some Day Smoker    Packs/day: 0.20    Years: 48.00    Types: Cigarettes  . Smokeless tobacco: Never Used  . Alcohol use No     Comment: last drink in [id number], former alcoholic     Allergies   Morphine and related and Penicillins   Review of Systems Review of Systems  Constitutional: Positive for fatigue. Negative for appetite change.  HENT: Negative for congestion, ear discharge and sinus pressure.   Eyes: Negative for discharge.  Respiratory: Negative for cough.   Cardiovascular: Negative for chest pain.  Gastrointestinal: Negative for abdominal pain and diarrhea.  Genitourinary: Negative for frequency and hematuria.  Musculoskeletal: Negative for back pain.  Skin: Negative for rash.  Neurological: Negative for seizures and headaches.  Psychiatric/Behavioral: Negative for hallucinations.     Physical Exam Updated Vital Signs BP (!) 71/47   Pulse 61   Temp 97.6 F (36.4 C) (Oral)   Resp 15   Ht 5\' 3"  (1.6 m)   Wt 123 lb (55.8 kg)   LMP 01/22/1988 (Approximate)   SpO2 100%   BMI 21.79 kg/m   Physical Exam  Constitutional: She is oriented to person, place, and time. She appears well-developed.  HENT:  Head: Normocephalic.  Eyes: Conjunctivae and  EOM are normal. No scleral icterus.  Neck: Neck supple. No thyromegaly present.  Cardiovascular: Normal rate and regular rhythm.  Exam reveals no gallop and no friction rub.   No murmur heard. Pulmonary/Chest: No stridor. She has no wheezes. She has no rales. She exhibits no tenderness.  Abdominal: She exhibits no distension. There is no tenderness. There is no rebound.  Genitourinary:  Genitourinary Comments: Rectal exam brown stool heme-positive.  Musculoskeletal: Normal range of motion. She exhibits no edema.  Lymphadenopathy:    She has no cervical adenopathy.  Neurological: She is oriented to person, place, and time. She exhibits normal muscle tone. Coordination normal.  Skin: No rash noted. No erythema.  Psychiatric: She has a normal mood and affect. Her behavior is normal.     ED Treatments / Results  Labs (all labs ordered are listed, but only abnormal results are displayed) Labs Reviewed  CBC WITH DIFFERENTIAL/PLATELET - Abnormal; Notable for the following:       Result Value   RBC 1.87 (*)    Hemoglobin 5.7 (*)    HCT 17.5 (*)    All other components within normal limits  COMPREHENSIVE METABOLIC PANEL - Abnormal; Notable for the following:    Glucose, Bld 112 (*)    BUN 52 (*)    Creatinine, Ser 1.95 (*)    GFR calc non Af Amer 27 (*)    GFR calc Af Amer 31 (*)    All other components within normal limits  PROTIME-INR - Abnormal; Notable for the following:    Prothrombin Time 84.7 (*)    INR 10.22 (*)    All other components within normal limits  POC OCCULT BLOOD, ED - Abnormal; Notable for the following:    Fecal Occult Bld POSITIVE (*)    All other components within normal limits  URINALYSIS, ROUTINE W REFLEX MICROSCOPIC  I-STAT TROPOININ, ED  TYPE AND SCREEN  PREPARE RBC (CROSSMATCH)    EKG  EKG Interpretation  Date/Time:  Saturday January 20 2016 17:59:19 EST Ventricular Rate:  62 PR Interval:    QRS Duration: 97 QT Interval:  443 QTC  Calculation: 450 R Axis:   119 Text Interpretation:  Atrial-ventricular dual-paced rhythm No further analysis attempted due to paced rhythm Confirmed by Clayton Bosserman  MD, Yatzary Merriweather [phone]) on 01/20/2016 6:51:35 PM       Radiology Dg Chest Portable 1 View  Result Date: 01/20/2016 CLINICAL DATA:  Generalized weakness.  Nausea .  Lack of appetite. EXAM: PORTABLE CHEST 1 VIEW COMPARISON:  10/31/2015 FINDINGS: Heart size is normal. Mediastinal shadows are normal. Pacemaker/AICD remains in place. The vascularity is normal. The lungs are clear. No effusions. IMPRESSION: No active disease. Electronically Signed   By: Nelson Chimes M.D.   On: 01/20/2016 19:13    Procedures Procedures (including critical care time)  Medications Ordered in ED Medications  prothrombin complex conc human (KCENTRA) IVPB 2,800 Units (not administered)  phytonadione (VITAMIN K) 10 mg in dextrose 5 % 50 mL IVPB (not administered)  pantoprazole (PROTONIX) injection 40 mg (not administered)  sodium chloride 0.9 % bolus 250 mL (0 mLs Intravenous Stopped 01/20/16 1915)  ondansetron (ZOFRAN) injection 4 mg (4 mg Intravenous Given 01/20/16 1830)  0.9 %  sodium chloride infusion (10 mL/hr Intravenous New Bag/Given 01/20/16 1915)  pantoprazole (PROTONIX) injection 40 mg (40 mg Intravenous Given 01/20/16 1827)  sodium chloride 0.9 % bolus 500 mL (500 mLs Intravenous New Bag/Given 01/20/16 1914)     Initial Impression / Assessment and Plan / ED Course  I have reviewed the triage vital signs and the nursing notes.  Pertinent labs & imaging results  that were available during my care of the patient were reviewed by me and considered in my medical decision making (see chart for details).  Clinical Course    CRITICAL CARE Performed by: Kelechi Astarita L Total critical care time:40 minutes Critical care time was exclusive of separately billable procedures and treating other patients. Critical care was necessary to treat or prevent  imminent or life-threatening deterioration. Critical care was time spent personally by me on the following activities: development of treatment plan with patient and/or surrogate as well as nursing, discussions with consultants, evaluation of patient's response to treatment, examination of patient, obtaining history from patient or surrogate, ordering and performing treatments and interventions, ordering and review of laboratory studies, ordering and review of radiographic studies, pulse oximetry and re-evaluation of patient's condition.   Final Clinical Impressions(s) / ED Diagnoses   Final diagnoses:  None  Patient with anemia hypotensive heme positive stools. I consult a GI who agrees with protonic Kcentra and packed red blood cells. Patient will be admitted to the ICU by medicine and GI will consult  New Prescriptions New Prescriptions   No medications on file     Milton Ferguson, MD 01/20/16 1940

## 2016-01-20 NOTE — H&P (Signed)
History and Physical  [name] DOB: [birth year] DOA: 01/20/2016  Referring physician: Dr Dewayne Hatch, ED physician PCP: Rosita Fire, MD  Outpatient Specialists:   Dr Rayann Heman (EP)  Dr Oneida Alar (GI)  Dr Aundra Dubin (Cardiology)  Patient coming from [facility] adult care  Chief Complaint: Weakness  HPI: [name] is a 60 y.o. female with a history of anemia, cardiovascular disease, cardiomyopathy (echo 02/15/15 shows LVEF of 25-30%), diverticulitis, history of alcohol abuse in remission, hypertension, hypothyroidism, GERD. Patient has been having worsening anemia over the past few months and worsening fatigue, dizziness, lightheadedness over the past week. These symptoms become acutely worse over the past several days. Symptoms are worse with ambulation and improved with rest. She has been taking iron.  Emergency Department Course: Patient received IV fluid bolus, type and crossmatched and started with units of packed red blood cells. Digital rectal exam reveals Hemoccult-positive stool, though no melena or gross blood. Patient given K Sentra and vitamin K infusion due to INR greater than 10.  Review of Systems:   Pt denies any fevers, chills, nausea, vomiting, diarrhea, constipation, abdominal pain, shortness of breath, dyspnea on exertion, orthopnea, cough, wheezing, palpitations, headache, vision changes, melena, rectal bleeding.  Review of systems are otherwise negative  Past Medical History:  Diagnosis Date  . Anemia   . Anxiety 2015   . CAD (coronary artery disease)    a. LHC (04/2013): Chronically occluded LAD, moderate dz in large OM1 and severe dz and small posterior lateral vessel    . chronic systolic heart failure    a. ECHO (05/08/13): EF 20-25%, diff HK with akinesis of basal inferior wall, grade 1 DD, trivial MR, trivial TR b) RHC (05/06/13): RA 7, RV 30/2, PA 31/19 (24), PCWP 10, Fick CO/CI: 2.9/1.74, Thermo CO/Ci: 2.4/1.4, PA sat 53%  . Depression   .  Diverticulosis   . DNR (do not resuscitate) 06/05/2015   Confirmed with patient on 06/05/2015  . ETOH abuse 1981   started abusing alcohol at age 28, quit   . GERD (gastroesophageal reflux disease) 2015   . History of blood transfusion    "related to losing blood from somewhere; never found out from where"  . Hyperlipidemia 2015  . Hypertension 2015  . Hypothyroidism   . Ischemic cardiomyopathy   . Mass of right breast on mammogram 07/14/2014  . Pancreatitis 1980, 2015   in the setting of ETOH  . Persistent atrial fibrillation ([place]) 2015  . Psoriasis 1968  . Stroke [facility]) 2011   left side weakness, minimal  . Suicidal overdose ([place]) 10/03/13   Overdoes on Beta Blockers & Xarelto ([place])   Past Surgical History:  Procedure Laterality Date  . AGILE CAPSULE N/A 06/02/2014   Procedure: AGILE CAPSULE;  Surgeon: Danie Binder, MD;  Location: [facility] END[facility];  Service: Endoscopy;  Laterality: N/A;  0800  . BIOPSY N/A 11/22/2013   Procedure: GASTRIC BIOPSIES;  Surgeon: Danie Binder, MD;  Location: [facility] ORS;  Service: Endoscopy;  Laterality: N/A;  . COLONOSCOPY WITH PROPOFOL N/A 03/29/2014   SLF: 1. No definite source for anemia identified 2. 8 colon polyps removed 3. Severe diverticulosis noted the transverse colon  and right colon 4. Small internal hemorrohids.   . COLOSTOMY REVERSAL  ?2009  . EP IMPLANTABLE DEVICE N/A 09/13/2014   STJ single chamber ICD implanted by Dr Rayann Heman  . EP IMPLANTABLE DEVICE N/A 10/30/2015   STJ CRTD upgrade by Dr Rayann Heman  . ESOPHAGOGASTRODUODENOSCOPY (EGD) WITH PROPOFOL N/A  11/22/2013   Dr. Oneida Alar: gastritis and duodenitis, negative H.pylori  . GIVENS CAPSULE STUDY N/A 06/10/2014   Procedure: GIVENS CAPSULE STUDY;  Surgeon: Danie Binder, MD;  Location: [facility];  Service: Endoscopy;  Laterality: N/A;  0700  . ILEOSTOMY  ?2009  . ILEOSTOMY CLOSURE  ?2010  . LEFT AND RIGHT HEART CATHETERIZATION WITH CORONARY ANGIOGRAM N/A 05/10/2013   Procedure: LEFT AND RIGHT  HEART CATHETERIZATION WITH CORONARY ANGIOGRAM;  Surgeon: [name], MD;  Location[facility] CATH LAB;  Service: Cardiovascular;  Laterality: N/A;  . LEFT AND RIGHT HEART CATHETERIZATION WITH CORONARY ANGIOGRAM N/A 04/14/2014   Procedure: LEFT AND RIGHT HEART CATHETERIZATION WITH CORONARY ANGIOGRAM;  Surgeon: Larey Dresser, MD;  Location: [facility] CATH LAB;  Service: Cardiovascular;  Laterality: N/A;  . LEFT HEART CATHETERIZATION WITH CORONARY ANGIOGRAM N/A 09/03/2013   Procedure: LEFT HEART CATHETERIZATION WITH CORONARY ANGIOGRAM;  Surgeon: Sinclair Grooms, MD;  Location: [facility] CATH LAB;  Service: Cardiovascular;  Laterality: N/A;  . POLYPECTOMY N/A 03/29/2014   Procedure: POLYPECTOMY;  Surgeon: Danie Binder, MD;  Location: [facility] ORS;  Service: Endoscopy;  Laterality: N/A;  ascending colon, sigmoid colon x4, rectal x3   Social History:  reports that she has been smoking Cigarettes.  She has a 9.60 pack-year smoking history. She has never used smokeless tobacco. She reports that she does not drink alcohol or use drugs. [facility]  Allergies  Allergen Reactions  . Morphine And Related Other (See Comments)    Dizziness and hallucinations  . Penicillins Anaphylaxis and Rash    Has patient had a PCN reaction causing immediate rash, facial/tongue/throat swelling, SOB or lightheadedness with hypotension:unknown Has patient had a PCN reaction causing severe rash involving mucus membranes or skin necrosis: unknown Has patient had a PCN reaction that required hospitalization unknown Has patient had a PCN reaction occurring within the last 10 years: unknown If all of the above answers are "NO", then may proceed with Cephalosporin use.     Family History  Problem Relation Age of Onset  . CAD Father 87    MI  . CAD Mother 38    MI  . Alcohol abuse Mother   . Colon cancer Mother     in her 3s  . CAD Sister 68    Stent  . Diabetes Sister   . CAD Brother 60    MI  . Cancer Neg Hx         Prior to Admission medications   Medication Sig Start Date End Date Taking? Authorizing Provider  acetaminophen (TYLENOL) 325 MG tablet Take 650 mg by mouth every 6 (six) hours as needed for mild pain.   Yes Historical Provider, MD  ammonium lactate (AMLACTIN) 12 % cream Apply 1 g topically daily as needed for dry skin (apply to elbows).    Yes Historical Provider, MD  atorvastatin (LIPITOR) 80 MG tablet TAKE 1 TABLET BY MOUTH ONCE DAILY. 10/23/15  Yes Larey Dresser, MD  calcium carbonate (OS-CAL - DOSED IN MG OF ELEMENTAL CALCIUM) 1250 (500 Ca) MG tablet Take 1 tablet (500 mg of elemental calcium total) by mouth 3 (three) times daily with meals. 02/24/15  Yes Rexene Alberts, MD  carvedilol (COREG) 12.5 MG tablet TAKE 1 TABLET BY MOUTH TWICE DAILY. 10/23/15  Yes Larey Dresser, MD  clonazePAM (KLONOPIN) 0.5 MG tablet Take 0.5 mg by mouth 2 (two) times daily.   Yes Historical Provider, MD  ezetimibe (ZETIA) 10 MG  tablet TAKE 1 TABLET BY MOUTH ONCE DAILY. 10/23/15  Yes Larey Dresser, MD  levothyroxine (SYNTHROID, LEVOTHROID) 25 MCG tablet TAKE 1/2 TABLET BY MOUTH BEFORE BREAKFAST. 10/23/15  Yes Larey Dresser, MD  lisinopril (PRINIVIL,ZESTRIL) 2.5 MG tablet Take 1 tablet (2.5 mg total) by mouth daily. D/C order for Hydralazine 12/28/15 03/27/16 Yes Josalyn Funches, MD  nitroGLYCERIN (NITROSTAT) 0.3 MG SL tablet Place 0.3 mg under the tongue every 5 (five) minutes as needed for chest pain (MAX 3 TABLETS). Reported on 07/18/2015   Yes Historical Provider, MD  ondansetron (ZOFRAN) 4 MG tablet Take 4 mg by mouth every 8 (eight) hours as needed for nausea or vomiting. Reported on 07/18/2015   Yes Historical Provider, MD  pantoprazole (PROTONIX) 40 MG tablet TAKE 1 TABLET BY MOUTH TWICE DAILY BEFORE A MEAL. 10/23/15  Yes Larey Dresser, MD  potassium chloride SA (K-DUR,KLOR-CON) 20 MEQ tablet TAKE 1 TABLET BY MOUTH ONCE DAILY. 10/23/15  Yes Larey Dresser, MD  sertraline (ZOLOFT) 50 MG tablet TAKE 2 TABLETS  (=TO A 100MG  DOSE) BY MOUTH ONCE DAILY. 10/23/15  Yes Larey Dresser, MD  torsemide (DEMADEX) 20 MG tablet Take 1 tablet (20 mg total) by mouth every other day. 09/29/15  Yes Larey Dresser, MD  warfarin (COUMADIN) 2.5 MG tablet Take 2.5 mg by mouth daily. Take 1 and 1/2 daily on Tuesday, Thursday , Saturday and Sunday only   Yes Historical Provider, MD  warfarin (COUMADIN) 5 MG tablet Take 5 mg by mouth daily. Take on Monday Wednesday and friday   Yes Historical Provider, MD  warfarin (COUMADIN) 7.5 MG tablet Take 3.75 mg by mouth daily.   Yes Historical Provider, MD    Physical Exam: BP (!) 71/47   Pulse 61   Temp 97.6 F (36.4 C) (Oral)   Resp 15   Ht 5\' 3"  (1.6 m)   Wt 55.8 kg (123 lb)   LMP 01/22/1988 (Approximate)   SpO2 100%   BMI 21.79 kg/m   General: Elderly Caucasian female. Awake and alert and oriented x3. No acute cardiopulmonary distress.  HEENT: Normocephalic atraumatic.  Right and left ears normal in appearance.  Pupils equal, round, reactive to light. Extraocular muscles are intact. Sclerae anicteric and noninjected.  Pale conjunctiva and mucous membranes. Moist mucosal membranes. No mucosal lesions.  Neck: Neck supple without lymphadenopathy. No carotid bruits. No masses palpated.  Cardiovascular: Regular rate with normal S1-S2 sounds. No murmurs, rubs, gallops auscultated. No JVD.  Respiratory: Good respiratory effort with no wheezes, rales, rhonchi. Lungs clear to auscultation bilaterally.  No accessory muscle use. Abdomen: Soft, nontender, nondistended. Active bowel sounds. No masses or hepatosplenomegaly  Skin: No rashes, lesions, or ulcerations.  Dry, warm to touch. 2+ dorsalis pedis and radial pulses. Musculoskeletal: No calf or leg pain. All major joints not erythematous nontender.  No upper or lower joint deformation.  Good ROM.  No contractures  Psychiatric: Intact judgment and insight. Pleasant and cooperative. Neurologic: No focal neurological deficits.  Strength is 5/5 and symmetric in upper and lower extremities.  Cranial nerves II through XII are grossly intact.           Labs on Admission: I have personally reviewed following labs and imaging studies  CBC:  Recent Labs Lab 01/20/16 1754  WBC 6.1  NEUTROABS 4.0  HGB 5.7*  HCT 17.5*  MCV 93.6  PLT [id number]   Basic Metabolic Panel:  Recent Labs Lab 01/20/16 1754  NA 138  K 4.8  CL 108  CO2 23  GLUCOSE 112*  BUN 52*  CREATININE 1.95*  CALCIUM 9.4   GFR: Estimated Creatinine Clearance: 25.4 mL/min (by C-G formula based on SCr of 1.95 mg/dL (H)). Liver Function Tests:  Recent Labs Lab 01/20/16 1754  AST 23  ALT 19  ALKPHOS 43  BILITOT 0.4  PROT 7.3  ALBUMIN 4.1   No results for input(s): LIPASE, AMYLASE in the last 168 hours. No results for input(s): AMMONIA in the last 168 hours. Coagulation Profile:  Recent Labs Lab 01/20/16 1753  INR 10.22*   Cardiac Enzymes: No results for input(s): CKTOTAL, CKMB, CKMBINDEX, TROPONINI in the last 168 hours. BNP (last 3 results) No results for input(s): PROBNP in the last 8760 hours. HbA1C: No results for input(s): HGBA1C in the last 72 hours. CBG: No results for input(s): GLUCAP in the last 168 hours. Lipid Profile: No results for input(s): CHOL, HDL, LDLCALC, TRIG, CHOLHDL, LDLDIRECT in the last 72 hours. Thyroid Function Tests: No results for input(s): TSH, T4TOTAL, FREET4, T3FREE, THYROIDAB in the last 72 hours. Anemia Panel: No results for input(s): VITAMINB12, FOLATE, FERRITIN, TIBC, IRON, RETICCTPCT in the last 72 hours. Urine analysis:    Component Value Date/Time   COLORURINE STRAW (A) 01/20/2016 2005   APPEARANCEUR CLEAR 01/20/2016 2005   LABSPEC 1.008 01/20/2016 2005   PHURINE 5.0 01/20/2016 2005   GLUCOSEU NEGATIVE 01/20/2016 2005   HGBUR NEGATIVE 01/20/2016 2005   BILIRUBINUR NEGATIVE 01/20/2016 2005   [place] 01/20/2016 2005   PROTEINUR NEGATIVE 01/20/2016 2005   UROBILINOGEN 0.2  10/03/2013 2113   NITRITE NEGATIVE 01/20/2016 2005   LEUKOCYTESUR SMALL (A) 01/20/2016 2005   Sepsis Labs: @LABRCNTIP (procalcitonin:4,lacticidven:4) )No results found for this or any previous visit (from the past 240 hour(s)).   Radiological Exams on Admission: Dg Chest Portable 1 View  Result Date: 01/20/2016 CLINICAL DATA:  Generalized weakness.  Nausea .  Lack of appetite. EXAM: PORTABLE CHEST 1 VIEW COMPARISON:  10/31/2015 FINDINGS: Heart size is normal. Mediastinal shadows are normal. Pacemaker/AICD remains in place. The vascularity is normal. The lungs are clear. No effusions. IMPRESSION: No active disease. Electronically Signed   By: Nelson Chimes M.D.   On: 01/20/2016 19:13    EKG: Independently reviewed. Duly paced rhythm  Assessment/Plan: Principal Problem:   Symptomatic anemia Active Problems:   Hypertension   GERD (gastroesophageal reflux disease)   Cardiomyopathy, ischemic- EF 25-30% 2D 09/02/13   Chronic kidney disease, stage 4 (severe) (HCC)   Hypothyroidism   Paroxysmal a-fib (HCC)   Elevated INR   Heme positive stool    This patient was discussed with the ED physician, including pertinent vitals, physical exam findings, labs, and imaging.  We also discussed care given by the ED provider.  #1 symptomatic anemia  Admit to stepdown  Continue packed red blood cell transfusion #2 elevated INR  K centra, fireman K infusion  Check INR as per protocol #3 heme positive stool  GI to see the patient tomorrow #4 paroxysmal fibrillation  Paced rhythm #5 chronic kidney disease  Baseline Cr #6 dilated cardiomyopathy  Compensated  Toursamide tomorrow #7 GERD  Continue PPI #8 hypothyroidism  Continue synthroid #9 Hypertension  Hold antihypertensives as hypotensive   DVT prophylaxis: SCD Consultants: GI Code Status: DNR Family Communication: None  Disposition Plan: [facility] should be which [facility]   Truett Mainland, DO [facility] Pager [phone]  If 7PM-7AM, please contact night-coverage [email or web address] Password [email or web address]

## 2016-01-20 NOTE — ED Triage Notes (Signed)
Pt has had generalized weakness, nausea, lack of appetitie, body aches and nausea and vomiting.  Pt was brought by [facility] from [place].  Pt is alert and oriented and reports feeling weak.

## 2016-01-20 NOTE — ED Notes (Signed)
Pt moved to room 4 with charge RN

## 2016-01-20 NOTE — ED Notes (Signed)
Patient placed in trendelenburg.

## 2016-01-21 DIAGNOSIS — R195 Other fecal abnormalities: Secondary | ICD-10-CM

## 2016-01-21 DIAGNOSIS — D649 Anemia, unspecified: Secondary | ICD-10-CM

## 2016-01-21 LAB — TYPE AND SCREEN
Blood Product Expiration Date: 201801262359
Blood Product Expiration Date: 201801302359
ISSUE DATE / TIME: 201712301900
ISSUE DATE / TIME: 201712302210
UNIT TYPE AND RH: 600
UNIT TYPE AND RH: 600

## 2016-01-21 LAB — CBC
HCT: 27.2 % — ABNORMAL LOW (ref 36.0–46.0)
Hemoglobin: 9.2 g/dL — ABNORMAL LOW (ref 12.0–15.0)
MCH: 32.1 pg (ref 26.0–34.0)
MCHC: 33.8 g/dL (ref 30.0–36.0)
MCV: 94.8 fL (ref 78.0–100.0)
PLATELETS: 159 10*3/uL (ref 150–400)
RBC: 2.87 MIL/uL — ABNORMAL LOW (ref 3.87–5.11)
RDW: 13.6 % (ref 11.5–15.5)
WBC: 4.6 10*3/uL (ref 4.0–10.5)

## 2016-01-21 LAB — PROTIME-INR
INR: 1.05
INR: 0.99
INR: 0.93
INR: 0.95
PROTHROMBIN TIME: 13.7 s (ref 11.4–15.2)
Prothrombin Time: 13.1 seconds (ref 11.4–15.2)
Prothrombin Time: 12.5 seconds (ref 11.4–15.2)
Prothrombin Time: 12.7 seconds (ref 11.4–15.2)

## 2016-01-21 LAB — BASIC METABOLIC PANEL
Anion gap: 6 (ref 5–15)
BUN: 52 mg/dL — ABNORMAL HIGH (ref 6–20)
CALCIUM: 8.2 mg/dL — AB (ref 8.9–10.3)
CO2: 22 mmol/L (ref 22–32)
CREATININE: 1.72 mg/dL — AB (ref 0.44–1.00)
Chloride: 112 mmol/L — ABNORMAL HIGH (ref 101–111)
GFR calc Af Amer: 36 mL/min — ABNORMAL LOW (ref >60)
GFR, EST NON AFRICAN AMERICAN: 31 mL/min — AB (ref >60)
GLUCOSE: 69 mg/dL (ref 65–99)
Potassium: 4.5 mmol/L (ref 3.5–5.1)
SODIUM: 140 mmol/L (ref 135–145)

## 2016-01-21 NOTE — Progress Notes (Signed)
DR [facility] CALLED AND NOTIFIED OF GI CONSULT . HE  WAS ALREADY AWARE.

## 2016-01-21 NOTE — Consult Note (Signed)
Referring Provider: Truett Mainland, DO/Roy Willey Blade, MD Primary Care Physician:  Rosita Fire, MD Primary Gastroenterologist:  Dr. Laural Golden  Reason for Consultation:    Anemia and GI bleed.  HPI:   Patient is 60 year old Caucasian female with multiple medical problems who is chronically anticoagulated for paroxysmal atrial fibrillation was in usual state of health until 2 days ago when she began to experience nausea vomiting and diarrhea. She noted her stools to be black. She had 3 stools 2 days ago and she had 2 yesterday. She also noted blood in his pain just emesis. She felt very week. She did not experience shortness of breath or chest pain. She came to emergency room yesterday afternoon. She was evaluated and noted to have hemoglobin of 5.7. INR was 10.22. She was given Magdalene Patricia K and prothrombin complex. She was admitted to ICU. She has received 2 units of PRBCs. He feels much better. She denies abdominal pain, fever or chills. She does not take aspirin or other OTC NSAIDs. This morning she ate most of her breakfast. She feels much better. She states her weight has been stable and she has had good appetite this year. Heartburn is well controlled with PPI. She denies dysphagia.  Patient has history of GI bleed and anemia and has undergone evaluation history as follows.  EGD in November 2015 for melena and anemia. She was found to have gastroduodenitis. Gastric biopsy revealed reactive changes but no evidence of H. Pylori.  She had colonoscopy in March 2006 with removal of 8 polyps at least 4 were tubular adenomas.  She had small bowel given capsule study in May 2016 revealing small jejunal ulcer without stigmata of bleed.      Past Medical History:  Diagnosis Date  . Anemia   . Anxiety 2015   . CAD (coronary artery disease)    a. LHC (04/2013): Chronically occluded LAD, moderate dz in large OM1 and severe dz and small posterior lateral vessel    . chronic systolic heart failure    a. ECHO  (05/08/13): EF 20-25%, diff HK with akinesis of basal inferior wall, grade 1 DD, trivial MR, trivial TR b) RHC (05/06/13): RA 7, RV 30/2, PA 31/19 (24), PCWP 10, Fick CO/CI: 2.9/1.74, Thermo CO/Ci: 2.4/1.4, PA sat 53%  . Depression   . Diverticulosis   . DNR (do not resuscitate) 06/05/2015   Confirmed with patient on 06/05/2015  . ETOH abuse 1981   started abusing alcohol at age 78, quit   . GERD (gastroesophageal reflux disease) 2015   . History of blood transfusion    "related to losing blood from somewhere; never found out from where"  . Hyperlipidemia 2015  . Hypertension 2015  . Hypothyroidism   . Ischemic cardiomyopathy   . Mass of right breast on mammogram.It is benign lesion  07/14/2014  . Pancreatitis 1980, 2015   in the setting of ETOH  . Persistent atrial fibrillation ([place]) 2015  . Psoriasis 1968  . Stroke [facility]) 2011   left side weakness, minimal  . Suicidal overdose ([place]) 10/03/13   Overdoes on Beta Blockers & Xarelto ([place])    Past Surgical History:  Procedure Laterality Date  . AGILE CAPSULE N/A 06/02/2014   Procedure: AGILE CAPSULE;  Surgeon: Danie Binder, MD;  Location: [facility] ENDO SUITE;  Service: Endoscopy;  Laterality: N/A;  0800  . BIOPSY N/A 11/22/2013   Procedure: GASTRIC BIOPSIES;  Surgeon: Danie Binder, MD;  Location: [facility] ORS;  Service: Endoscopy;  Laterality: N/A;  .  COLONOSCOPY WITH PROPOFOL N/A 03/29/2014   SLF: 1. No definite source for anemia identified 2. 8 colon polyps removed 3. Severe diverticulosis noted the transverse colon  and right colon 4. Small internal hemorrohids.   . COLOSTOMY REVERSAL  ?2009  . EP IMPLANTABLE DEVICE N/A 09/13/2014   STJ single chamber ICD implanted by Dr Rayann Heman  . EP IMPLANTABLE DEVICE N/A 10/30/2015   STJ CRTD upgrade by Dr Rayann Heman  . ESOPHAGOGASTRODUODENOSCOPY (EGD) WITH PROPOFOL N/A 11/22/2013   Dr. Oneida Alar: gastritis and duodenitis, negative H.pylori  . GIVENS CAPSULE STUDY N/A 06/10/2014   Procedure: GIVENS CAPSULE STUDY;   Surgeon: Danie Binder, MD;  Location: [facility];  Service: Endoscopy;  Laterality: N/A;  0700  . ILEOSTOMY  ?2009  . ILEOSTOMY CLOSURE  ?2010  . LEFT AND RIGHT HEART CATHETERIZATION WITH CORONARY ANGIOGRAM N/A 05/10/2013   Procedure: LEFT AND RIGHT HEART CATHETERIZATION WITH CORONARY ANGIOGRAM;  Surgeon: [name], MD;  Location: [facility] CATH LAB;  Service: Cardiovascular;  Laterality: N/A;  . LEFT AND RIGHT HEART CATHETERIZATION WITH CORONARY ANGIOGRAM N/A 04/14/2014   Procedure: LEFT AND RIGHT HEART CATHETERIZATION WITH CORONARY ANGIOGRAM;  Surgeon: Larey Dresser, MD;  Location: [facility] CATH LAB;  Service: Cardiovascular;  Laterality: N/A;  . LEFT HEART CATHETERIZATION WITH CORONARY ANGIOGRAM N/A 09/03/2013   Procedure: LEFT HEART CATHETERIZATION WITH CORONARY ANGIOGRAM;  Surgeon: Sinclair Grooms, MD;  Location: [facility] CATH LAB;  Service: Cardiovascular;  Laterality: N/A;  . POLYPECTOMY N/A 03/29/2014   Procedure: POLYPECTOMY;  Surgeon: Danie Binder, MD;  Location: [facility] ORS;  Service: Endoscopy;  Laterality: N/A;  ascending colon, sigmoid colon x4, rectal x3    Prior to Admission medications   Medication Sig Start Date End Date Taking? Authorizing Provider  acetaminophen (TYLENOL) 325 MG tablet Take 650 mg by mouth every 6 (six) hours as needed for mild pain.   Yes Historical Provider, MD  ammonium lactate (AMLACTIN) 12 % cream Apply 1 g topically daily as needed for dry skin (apply to elbows).    Yes Historical Provider, MD  atorvastatin (LIPITOR) 80 MG tablet TAKE 1 TABLET BY MOUTH ONCE DAILY. 10/23/15  Yes Larey Dresser, MD  calcium carbonate (OS-CAL - DOSED IN MG OF ELEMENTAL CALCIUM) 1250 (500 Ca) MG tablet Take 1 tablet (500 mg of elemental calcium total) by mouth 3 (three) times daily with meals. 02/24/15  Yes Rexene Alberts, MD  carvedilol (COREG) 12.5 MG tablet TAKE 1 TABLET BY MOUTH TWICE DAILY. 10/23/15  Yes Larey Dresser, MD  clonazePAM (KLONOPIN) 0.5 MG tablet Take 0.5 mg by mouth 2  (two) times daily.   Yes Historical Provider, MD  ezetimibe (ZETIA) 10 MG tablet TAKE 1 TABLET BY MOUTH ONCE DAILY. 10/23/15  Yes Larey Dresser, MD  levothyroxine (SYNTHROID, LEVOTHROID) 25 MCG tablet TAKE 1/2 TABLET BY MOUTH BEFORE BREAKFAST. 10/23/15  Yes Larey Dresser, MD  lisinopril (PRINIVIL,ZESTRIL) 2.5 MG tablet Take 1 tablet (2.5 mg total) by mouth daily. D/C order for Hydralazine 12/28/15 03/27/16 Yes Josalyn Funches, MD  nitroGLYCERIN (NITROSTAT) 0.3 MG SL tablet Place 0.3 mg under the tongue every 5 (five) minutes as needed for chest pain (MAX 3 TABLETS). Reported on 07/18/2015   Yes Historical Provider, MD  ondansetron (ZOFRAN) 4 MG tablet Take 4 mg by mouth every 8 (eight) hours as needed for nausea or vomiting. Reported on 07/18/2015   Yes Historical Provider, MD  pantoprazole (PROTONIX) 40 MG tablet TAKE 1 TABLET BY MOUTH TWICE DAILY  BEFORE A MEAL. 10/23/15  Yes Larey Dresser, MD  potassium chloride SA (K-DUR,KLOR-CON) 20 MEQ tablet TAKE 1 TABLET BY MOUTH ONCE DAILY. 10/23/15  Yes Larey Dresser, MD  sertraline (ZOLOFT) 50 MG tablet TAKE 2 TABLETS (=TO A 100MG  DOSE) BY MOUTH ONCE DAILY. 10/23/15  Yes Larey Dresser, MD  torsemide (DEMADEX) 20 MG tablet Take 1 tablet (20 mg total) by mouth every other day. 09/29/15  Yes Larey Dresser, MD  warfarin (COUMADIN) 2.5 MG tablet Take 2.5 mg by mouth daily. Take 1 and 1/2 daily on Tuesday, Thursday , Saturday and Sunday only   Yes Historical Provider, MD  warfarin (COUMADIN) 5 MG tablet Take 5 mg by mouth daily. Take on Monday Wednesday and friday   Yes Historical Provider, MD  warfarin (COUMADIN) 7.5 MG tablet Take 3.75 mg by mouth daily.   Yes Historical Provider, MD    Current Facility-Administered Medications  Medication Dose Route Frequency Provider Last Rate Last Dose  . acetaminophen (TYLENOL) tablet 650 mg  650 mg Oral Q6H PRN Truett Mainland, DO       Or  . acetaminophen (TYLENOL) suppository 650 mg  650 mg Rectal Q6H PRN Tanna Savoy  Stinson, DO      . ammonium lactate (LAC-HYDRIN) 12 % lotion 1 application  1 application Topical Daily PRN Tanna Savoy Stinson, DO      . atorvastatin (LIPITOR) tablet 80 mg  80 mg Oral Daily Tanna Savoy Stinson, DO   80 mg at 01/21/16 [id number]  . clonazePAM (KLONOPIN) tablet 0.5 mg  0.5 mg Oral BID Tanna Savoy Stinson, DO   0.5 mg at 01/21/16 [id number]  . ezetimibe (ZETIA) tablet 10 mg  10 mg Oral Daily Tanna Savoy Stinson, DO   10 mg at 01/21/16 [id number]  . levothyroxine (SYNTHROID, LEVOTHROID) tablet 12.5 mcg  12.5 mcg Oral QAC breakfast Tanna Savoy Stinson, DO   12.5 mcg at 01/21/16 [id number]  . ondansetron (ZOFRAN) tablet 4 mg  4 mg Oral Q6H PRN Tanna Savoy Stinson, DO       Or  . ondansetron [facility]) injection 4 mg  4 mg Intravenous Q6H PRN Tanna Savoy Stinson, DO      . ondansetron [facility]) tablet 4 mg  4 mg Oral Q8H PRN Tanna Savoy Stinson, DO      . pantoprazole (PROTONIX) EC tablet 40 mg  40 mg Oral BID AC Tanna Savoy Stinson, DO   40 mg at 01/21/16 [id number]  . potassium chloride SA (K-DUR,KLOR-CON) CR tablet 20 mEq  20 mEq Oral Daily Tanna Savoy Stinson, DO   20 mEq at 01/21/16 [id number]  . sertraline (ZOLOFT) tablet 100 mg  100 mg Oral QHS Tanna Savoy Stinson, DO   100 mg at 01/20/16 2203  . [START ON 01/22/2016] torsemide (DEMADEX) tablet 20 mg  20 mg Oral QODAY Tanna Savoy Stinson, DO        Allergies as of 01/20/2016 - Review Complete 01/20/2016  Allergen Reaction Noted  . Morphine and related Other (See Comments) 09/24/2013  . Penicillins Anaphylaxis and Rash 05/06/2013    Family History  Problem Relation Age of Onset  . CAD Father 74    MI  . CAD Mother 58    MI  . Alcohol abuse Mother   . Colon cancer Mother     in her 87s  . CAD Sister 19    Stent  . Diabetes Sister   . CAD Brother 10    MI  . Cancer  Neg Hx     Social History   Social History  . Marital status: Married    Spouse name: N/A  . Number of children: 2  . Years of education: 10   Occupational History  . Unemployed     Social History Main Topics  . Smoking  status: Current Some Day Smoker    Packs/day: 0.20    Years: 48.00    Types: Cigarettes  . Smokeless tobacco: Never Used  . Alcohol use No     Comment: last drink in [id number], former alcoholic  . Drug use: No  . Sexual activity: No   Other Topics Concern  . Not on file   Social History Narrative   Lived previously with her spouse in an [place].     Son and daughter   Daughter in [place]   Son in [place]   Two grandchildren.       Presently in a group home [place], moved in 10/13/13.           Review of Systems: See HPI, otherwise normal ROS  Physical Exam: Temp:  [97.2 F (36.2 C)-98.9 F (37.2 C)] 98.2 F (36.8 C) (12/31 0730) Pulse Rate:  [57-86] 65 (12/31 1100) Resp:  [11-23] 16 (12/31 1100) BP: (61-104)/(39-90) 80/53 (12/31 1100) SpO2:  [83 %-100 %] 100 % (12/31 1100) FiO2 (%):  [100 %] 100 % (12/30 2135) Weight:  [122 lb 5.7 oz (55.5 kg)-123 lb (55.8 kg)] 122 lb 5.7 oz (55.5 kg) (12/31 0500) Last BM Date: 01/20/16  Well-developed thin Caucasian female who is in no acute distress. She appears pale. Conjunctiva also pale. Sclerae nonicteric. No neck masses or thyromegaly noted. She has AICD device in left pectoral region. Cardiac exam with regular rhythm normal S1 and S2. Heart sounds are distant. No murmur or gallop noted. Lungs are clear to auscultation. Abdomen is symmetrical with lower midline left colostomy and right ileostomy scars. Abdomen is soft and nontender without organomegaly or masses. No peripheral edema or clubbing noted.    Lab Results:  Recent Labs  01/20/16 1754 01/21/16 0412  WBC 6.1 4.6  HGB 5.7* 9.2*  HCT 17.5* 27.2*  PLT 252 159   BMET  Recent Labs  01/20/16 1754 01/21/16 0412  NA 138 140  K 4.8 4.5  CL 108 112*  CO2 23 22  GLUCOSE 112* 69  BUN 52* 52*  CREATININE 1.95* 1.72*  CALCIUM 9.4 8.2*   LFT  Recent Labs  01/20/16 1754  PROT 7.3  ALBUMIN 4.1  AST 23  ALT 19  ALKPHOS 43  BILITOT 0.4    PT/INR  Recent Labs  01/20/16 1753 01/21/16 0412  LABPROT 84.7* 13.7  INR 10.22* 1.05    Studies/Results: Dg Chest Portable 1 View  Result Date: 01/20/2016 CLINICAL DATA:  Generalized weakness.  Nausea .  Lack of appetite. EXAM: PORTABLE CHEST 1 VIEW COMPARISON:  10/31/2015 FINDINGS: Heart size is normal. Mediastinal shadows are normal. Pacemaker/AICD remains in place. The vascularity is normal. The lungs are clear. No effusions. IMPRESSION: No active disease. Electronically Signed   By: Nelson Chimes M.D.   On: 01/20/2016 19:13    Assessment;  Patient is 60 year old Caucasian female with multiple medical problems who presented to emergency room yesterday with profound weakness and one day history of nausea vomiting and diarrhea with tarry stools. She also reports seeing blood-tinged vomitus but no frank hematemesis. She was found to be profoundly anemic with hemoglobin of 5.7 g and INR was  supratherapeutic at 10. Coagulopathy has been corrected with Magdalene Patricia K and prothrombin complex. Hemoglobin is up to 9.2 g with 2 units of PRBCs. She has is free of chronic anemia possibly multifactorial. She also has history of GI bleed and underwent EGD in November 2015 revealing gastroduodenitis. She was found to have small nonbleeding jejunal ulcer on small bowel given capsule study of May 2016. Suspect GI bleed either from upper GI tract or small bowel. Since she has developed profound anemia repeat evaluation is warranted. Differential diagnoses includes peptic ulcer disease Mallory-Weiss tear or GI angiodysplasia. Doubt that she had from esophagitis given satisfactory control of GERD symptoms.   Recommendations;  Diagnostic esophagogastroduodenoscopy under monitored anesthesia care to be performed on 01/22/2016. If EGD is unremarkable she may need to repeat small bowel given capsule study. CBC in a.m.   LOS: 1 day   Jamela Cumbo  01/21/2016, 12:28 PM

## 2016-01-21 NOTE — Progress Notes (Signed)
Subjective: She is feeling much better today. She was hospitalized after presenting with weakness and anemia. She has been transfused with 2 units of packed red cells.  Objective: Vital signs in last 24 hours: Vitals:   01/21/16 0700 01/21/16 0730 01/21/16 0900 01/21/16 1000  BP: (!) 92/53  (!) 83/54 93/67  Pulse: 62  64 65  Resp: 13  15 16   Temp:  98.2 F (36.8 C)    TempSrc:  Oral    SpO2: 90%  100% 100%  Weight:      Height:       Weight change:   Intake/Output Summary (Last 24 hours) at 01/21/16 1038 Last data filed at 01/21/16 0932  Gross per 24 hour  Intake             1462 ml  Output              925 ml  Net              537 ml    Physical Exam: Alert and comfortable appearing. Lungs clear. Heart regular with a grade 2 systolic murmur. Abdomen soft and nontender with no hepatosplenomegaly or palpable mass.  Lab Results:    Results for orders placed or performed during the hospital encounter of 01/20/16 (from the past 24 hour(s))  Protime-INR     Status: Abnormal   Collection Time: 01/20/16  5:53 PM  Result Value Ref Range   Prothrombin Time 84.7 (H) 11.4 - 15.2 seconds   INR 10.22 (HH)   Type and screen     Status: None   Collection Time: 01/20/16  5:53 PM  Result Value Ref Range   ISSUE DATE / TIME [id number]    Blood Product Unit Number [id number]    PRODUCT CODE [id number]    Unit Type and Rh 0600    Blood Product Expiration Date [id number]    ISSUE DATE / TIME [id number]    Blood Product Unit Number [id number]    Unit Type and Rh 0600    Blood Product Expiration Date [id number]   Prepare RBC     Status: None   Collection Time: 01/20/16  5:53 PM  Result Value Ref Range   Order Confirmation ORDER PROCESSED BY BLOOD BANK   CBC with Differential/Platelet     Status: Abnormal   Collection Time: 01/20/16  5:54 PM  Result Value Ref Range   WBC 6.1 4.0 - 10.5 K/uL   RBC 1.87 (L) 3.87 - 5.11 MIL/uL   Hemoglobin 5.7 (LL) 12.0 - 15.0 g/dL   HCT  17.5 (L) 36.0 - 46.0 %   MCV 93.6 78.0 - 100.0 fL   MCH 30.5 26.0 - 34.0 pg   MCHC 32.6 30.0 - 36.0 g/dL   RDW 14.3 11.5 - 15.5 %   Platelets 252 150 - 400 K/uL   Neutrophils Relative % 66 %   Neutro Abs 4.0 1.7 - 7.7 K/uL   Lymphocytes Relative 27 %   Lymphs Abs 1.6 0.7 - 4.0 K/uL   Monocytes Relative 5 %   Monocytes Absolute 0.3 0.1 - 1.0 K/uL   Eosinophils Relative 2 %   Eosinophils Absolute 0.1 0.0 - 0.7 K/uL   Basophils Relative 1 %   Basophils Absolute 0.0 0.0 - 0.1 K/uL  Comprehensive metabolic panel     Status: Abnormal   Collection Time: 01/20/16  5:54 PM  Result Value Ref Range   Sodium 138 135 - 145 mmol/L   Potassium 4.8 3.5 -  5.1 mmol/L   Chloride 108 101 - 111 mmol/L   CO2 23 22 - 32 mmol/L   Glucose, Bld 112 (H) 65 - 99 mg/dL   BUN 52 (H) 6 - 20 mg/dL   Creatinine, Ser 1.95 (H) 0.44 - 1.00 mg/dL   Calcium 9.4 8.9 - 10.3 mg/dL   Total Protein 7.3 6.5 - 8.1 g/dL   Albumin 4.1 3.5 - 5.0 g/dL   AST 23 15 - 41 U/L   ALT 19 14 - 54 U/L   Alkaline Phosphatase 43 38 - 126 U/L   Total Bilirubin 0.4 0.3 - 1.2 mg/dL   GFR calc non Af Amer 27 (L) >60 mL/min   GFR calc Af Amer 31 (L) >60 mL/min   Anion gap 7 5 - 15  I-stat troponin, ED     Status: None   Collection Time: 01/20/16  6:05 PM  Result Value Ref Range   Troponin i, poc 0.00 0.00 - 0.08 ng/mL   Comment 3          POC occult blood, ED     Status: Abnormal   Collection Time: 01/20/16  6:29 PM  Result Value Ref Range   Fecal Occult Bld POSITIVE (A) NEGATIVE  Urinalysis, Routine w reflex microscopic     Status: Abnormal   Collection Time: 01/20/16  8:05 PM  Result Value Ref Range   Color, Urine STRAW (A) YELLOW   APPearance CLEAR CLEAR   Specific Gravity, Urine 1.008 1.005 - 1.030   pH 5.0 5.0 - 8.0   Glucose, UA NEGATIVE NEGATIVE mg/dL   Hgb urine dipstick NEGATIVE NEGATIVE   Bilirubin Urine NEGATIVE NEGATIVE   Ketones, ur NEGATIVE NEGATIVE mg/dL   Protein, ur NEGATIVE NEGATIVE mg/dL   Nitrite  NEGATIVE NEGATIVE   Leukocytes, UA SMALL (A) NEGATIVE   RBC / HPF 0-5 0 - 5 RBC/hpf   WBC, UA 0-5 0 - 5 WBC/hpf   Bacteria, UA NONE SEEN NONE SEEN   Mucous PRESENT    Hyaline Casts, UA PRESENT   MRSA PCR Screening     Status: None   Collection Time: 01/20/16  9:52 PM  Result Value Ref Range   MRSA by PCR NEGATIVE NEGATIVE  Protime-INR     Status: None   Collection Time: 01/21/16  4:12 AM  Result Value Ref Range   Prothrombin Time 13.7 11.4 - 15.2 seconds   INR 1.05   CBC     Status: Abnormal   Collection Time: 01/21/16  4:12 AM  Result Value Ref Range   WBC 4.6 4.0 - 10.5 K/uL   RBC 2.87 (L) 3.87 - 5.11 MIL/uL   Hemoglobin 9.2 (L) 12.0 - 15.0 g/dL   HCT 27.2 (L) 36.0 - 46.0 %   MCV 94.8 78.0 - 100.0 fL   MCH 32.1 26.0 - 34.0 pg   MCHC 33.8 30.0 - 36.0 g/dL   RDW 13.6 11.5 - 15.5 %   Platelets 159 150 - 400 K/uL  Basic metabolic panel     Status: Abnormal   Collection Time: 01/21/16  4:12 AM  Result Value Ref Range   Sodium 140 135 - 145 mmol/L   Potassium 4.5 3.5 - 5.1 mmol/L   Chloride 112 (H) 101 - 111 mmol/L   CO2 22 22 - 32 mmol/L   Glucose, Bld 69 65 - 99 mg/dL   BUN 52 (H) 6 - 20 mg/dL   Creatinine, Ser 1.72 (H) 0.44 - 1.00 mg/dL   Calcium 8.2 (  L) 8.9 - 10.3 mg/dL   GFR calc non Af Amer 31 (L) >60 mL/min   GFR calc Af Amer 36 (L) >60 mL/min   Anion gap 6 5 - 15     ABGS No results for input(s): PHART, PO2ART, TCO2, HCO3 in the last 72 hours.  Invalid input(s): PCO2 CULTURES Recent Results (from the past 240 hour(s))  MRSA PCR Screening     Status: None   Collection Time: 01/20/16  9:52 PM  Result Value Ref Range Status   MRSA by PCR NEGATIVE NEGATIVE Final    Comment:        The GeneXpert MRSA Assay (FDA approved for NASAL specimens only), is one component of a comprehensive MRSA colonization surveillance program. It is not intended to diagnose MRSA infection nor to guide or monitor treatment for MRSA infections.    Studies/Results: Dg Chest  Portable 1 View  Result Date: 01/20/2016 CLINICAL DATA:  Generalized weakness.  Nausea .  Lack of appetite. EXAM: PORTABLE CHEST 1 VIEW COMPARISON:  10/31/2015 FINDINGS: Heart size is normal. Mediastinal shadows are normal. Pacemaker/AICD remains in place. The vascularity is normal. The lungs are clear. No effusions. IMPRESSION: No active disease. Electronically Signed   By: Nelson Chimes M.D.   On: 01/20/2016 19:13   Micro Results: Recent Results (from the past 240 hour(s))  MRSA PCR Screening     Status: None   Collection Time: 01/20/16  9:52 PM  Result Value Ref Range Status   MRSA by PCR NEGATIVE NEGATIVE Final    Comment:        The GeneXpert MRSA Assay (FDA approved for NASAL specimens only), is one component of a comprehensive MRSA colonization surveillance program. It is not intended to diagnose MRSA infection nor to guide or monitor treatment for MRSA infections.    Studies/Results: Dg Chest Portable 1 View  Result Date: 01/20/2016 CLINICAL DATA:  Generalized weakness.  Nausea .  Lack of appetite. EXAM: PORTABLE CHEST 1 VIEW COMPARISON:  10/31/2015 FINDINGS: Heart size is normal. Mediastinal shadows are normal. Pacemaker/AICD remains in place. The vascularity is normal. The lungs are clear. No effusions. IMPRESSION: No active disease. Electronically Signed   By: Nelson Chimes M.D.   On: 01/20/2016 19:13   Medications:  I have reviewed the patient's current medications Scheduled Meds: . atorvastatin  80 mg Oral Daily  . clonazePAM  0.5 mg Oral BID  . ezetimibe  10 mg Oral Daily  . levothyroxine  12.5 mcg Oral QAC breakfast  . pantoprazole  40 mg Oral BID AC  . potassium chloride SA  20 mEq Oral Daily  . sertraline  100 mg Oral QHS  . [START ON 01/22/2016] torsemide  20 mg Oral QODAY   Continuous Infusions: PRN Meds:.acetaminophen **OR** acetaminophen, ammonium lactate, ondansetron **OR** ondansetron (ZOFRAN) IV, ondansetron   Assessment/Plan: #1. GI bleed/normocytic  anemia. Stool was Hemoccult positive. She has a history of Blood loss from gastritis and duodenitis. Hemoglobin has improved from 5.79.2 with 2 units of packed red cells. GI consult is pending. She received vitamin K and Kathlynn Grate. Initial INR was 10.2 and is now 1.05. Coumadin is held. #2. Chronic atrial fibrillation. #3. History of chronic systolic heart failure. #4. Chronic kidney disease. Creatinine 1.7. Blood pressures are currently running in the [id number] to 0000000 systolic. Principal Problem:   Symptomatic anemia Active Problems:   Hypertension   GERD (gastroesophageal reflux disease)   Cardiomyopathy, ischemic- EF 25-30% 2D 09/02/13   Chronic kidney disease, stage 4 (  severe) ([place])   Hypothyroidism   Paroxysmal a-fib ([facility])   Elevated INR   Heme positive stool     LOS: 1 day   Kyndall Amero 01/21/2016, 10:38 AM

## 2016-01-21 NOTE — Progress Notes (Signed)
Subjective: She states she is feeling much better now. She was transfused with 2 units of packed red cells yesterday. She does admit to having experienced some melena recently.  Objective: Vital signs in last 24 hours: Vitals:   01/21/16 0645 01/21/16 0700 01/21/16 0730 01/21/16 0900  BP: 101/70 (!) 92/53  (!) 83/54  Pulse: 62 62  64  Resp: 14 13  15   Temp:   98.2 F (36.8 C)   TempSrc:   Oral   SpO2: 100% 90%  100%  Weight:      Height:       Weight change:   Intake/Output Summary (Last 24 hours) at 01/21/16 1022 Last data filed at 01/21/16 0932  Gross per 24 hour  Intake             1462 ml  Output              925 ml  Net              537 ml    Physical Exam: Alert. No distress. Lungs clear. Heart regular with a grade 2 systolic murmur. Abdomen soft and nontender with no hepatosplenomegaly or palpable mass. Neuro appears at baseline.  Lab Results:    Results for orders placed or performed during the hospital encounter of 01/20/16 (from the past 24 hour(s))  Protime-INR     Status: Abnormal   Collection Time: 01/20/16  5:53 PM  Result Value Ref Range   Prothrombin Time 84.7 (H) 11.4 - 15.2 seconds   INR 10.22 (HH)   Type and screen     Status: None   Collection Time: 01/20/16  5:53 PM  Result Value Ref Range   ISSUE DATE / TIME [id number]    Blood Product Unit Number [id number]    PRODUCT CODE [id number]    Unit Type and Rh 0600    Blood Product Expiration Date [id number]    ISSUE DATE / TIME [id number]    Blood Product Unit Number [id number]    Unit Type and Rh 0600    Blood Product Expiration Date [id number]   Prepare RBC     Status: None   Collection Time: 01/20/16  5:53 PM  Result Value Ref Range   Order Confirmation ORDER PROCESSED BY BLOOD BANK   CBC with Differential/Platelet     Status: Abnormal   Collection Time: 01/20/16  5:54 PM  Result Value Ref Range   WBC 6.1 4.0 - 10.5 K/uL   RBC 1.87 (L) 3.87 - 5.11 MIL/uL   Hemoglobin 5.7 (LL)  12.0 - 15.0 g/dL   HCT 17.5 (L) 36.0 - 46.0 %   MCV 93.6 78.0 - 100.0 fL   MCH 30.5 26.0 - 34.0 pg   MCHC 32.6 30.0 - 36.0 g/dL   RDW 14.3 11.5 - 15.5 %   Platelets 252 150 - 400 K/uL   Neutrophils Relative % 66 %   Neutro Abs 4.0 1.7 - 7.7 K/uL   Lymphocytes Relative 27 %   Lymphs Abs 1.6 0.7 - 4.0 K/uL   Monocytes Relative 5 %   Monocytes Absolute 0.3 0.1 - 1.0 K/uL   Eosinophils Relative 2 %   Eosinophils Absolute 0.1 0.0 - 0.7 K/uL   Basophils Relative 1 %   Basophils Absolute 0.0 0.0 - 0.1 K/uL  Comprehensive metabolic panel     Status: Abnormal   Collection Time: 01/20/16  5:54 PM  Result Value Ref Range   Sodium 138 135 - 145 mmol/L  Potassium 4.8 3.5 - 5.1 mmol/L   Chloride 108 101 - 111 mmol/L   CO2 23 22 - 32 mmol/L   Glucose, Bld 112 (H) 65 - 99 mg/dL   BUN 52 (H) 6 - 20 mg/dL   Creatinine, Ser 1.95 (H) 0.44 - 1.00 mg/dL   Calcium 9.4 8.9 - 10.3 mg/dL   Total Protein 7.3 6.5 - 8.1 g/dL   Albumin 4.1 3.5 - 5.0 g/dL   AST 23 15 - 41 U/L   ALT 19 14 - 54 U/L   Alkaline Phosphatase 43 38 - 126 U/L   Total Bilirubin 0.4 0.3 - 1.2 mg/dL   GFR calc non Af Amer 27 (L) >60 mL/min   GFR calc Af Amer 31 (L) >60 mL/min   Anion gap 7 5 - 15  I-stat troponin, ED     Status: None   Collection Time: 01/20/16  6:05 PM  Result Value Ref Range   Troponin i, poc 0.00 0.00 - 0.08 ng/mL   Comment 3          POC occult blood, ED     Status: Abnormal   Collection Time: 01/20/16  6:29 PM  Result Value Ref Range   Fecal Occult Bld POSITIVE (A) NEGATIVE  Urinalysis, Routine w reflex microscopic     Status: Abnormal   Collection Time: 01/20/16  8:05 PM  Result Value Ref Range   Color, Urine STRAW (A) YELLOW   APPearance CLEAR CLEAR   Specific Gravity, Urine 1.008 1.005 - 1.030   pH 5.0 5.0 - 8.0   Glucose, UA NEGATIVE NEGATIVE mg/dL   Hgb urine dipstick NEGATIVE NEGATIVE   Bilirubin Urine NEGATIVE NEGATIVE   Ketones, ur NEGATIVE NEGATIVE mg/dL   Protein, ur NEGATIVE NEGATIVE  mg/dL   Nitrite NEGATIVE NEGATIVE   Leukocytes, UA SMALL (A) NEGATIVE   RBC / HPF 0-5 0 - 5 RBC/hpf   WBC, UA 0-5 0 - 5 WBC/hpf   Bacteria, UA NONE SEEN NONE SEEN   Mucous PRESENT    Hyaline Casts, UA PRESENT   MRSA PCR Screening     Status: None   Collection Time: 01/20/16  9:52 PM  Result Value Ref Range   MRSA by PCR NEGATIVE NEGATIVE  Protime-INR     Status: None   Collection Time: 01/21/16  4:12 AM  Result Value Ref Range   Prothrombin Time 13.7 11.4 - 15.2 seconds   INR 1.05   CBC     Status: Abnormal   Collection Time: 01/21/16  4:12 AM  Result Value Ref Range   WBC 4.6 4.0 - 10.5 K/uL   RBC 2.87 (L) 3.87 - 5.11 MIL/uL   Hemoglobin 9.2 (L) 12.0 - 15.0 g/dL   HCT 27.2 (L) 36.0 - 46.0 %   MCV 94.8 78.0 - 100.0 fL   MCH 32.1 26.0 - 34.0 pg   MCHC 33.8 30.0 - 36.0 g/dL   RDW 13.6 11.5 - 15.5 %   Platelets 159 150 - 400 K/uL  Basic metabolic panel     Status: Abnormal   Collection Time: 01/21/16  4:12 AM  Result Value Ref Range   Sodium 140 135 - 145 mmol/L   Potassium 4.5 3.5 - 5.1 mmol/L   Chloride 112 (H) 101 - 111 mmol/L   CO2 22 22 - 32 mmol/L   Glucose, Bld 69 65 - 99 mg/dL   BUN 52 (H) 6 - 20 mg/dL   Creatinine, Ser 1.72 (H) 0.44 - 1.00 mg/dL  Calcium 8.2 (L) 8.9 - 10.3 mg/dL   GFR calc non Af Amer 31 (L) >60 mL/min   GFR calc Af Amer 36 (L) >60 mL/min   Anion gap 6 5 - 15     ABGS No results for input(s): PHART, PO2ART, TCO2, HCO3 in the last 72 hours.  Invalid input(s): PCO2 CULTURES Recent Results (from the past 240 hour(s))  MRSA PCR Screening     Status: None   Collection Time: 01/20/16  9:52 PM  Result Value Ref Range Status   MRSA by PCR NEGATIVE NEGATIVE Final    Comment:        The GeneXpert MRSA Assay (FDA approved for NASAL specimens only), is one component of a comprehensive MRSA colonization surveillance program. It is not intended to diagnose MRSA infection nor to guide or monitor treatment for MRSA infections.     Studies/Results: Dg Chest Portable 1 View  Result Date: 01/20/2016 CLINICAL DATA:  Generalized weakness.  Nausea .  Lack of appetite. EXAM: PORTABLE CHEST 1 VIEW COMPARISON:  10/31/2015 FINDINGS: Heart size is normal. Mediastinal shadows are normal. Pacemaker/AICD remains in place. The vascularity is normal. The lungs are clear. No effusions. IMPRESSION: No active disease. Electronically Signed   By: Nelson Chimes M.D.   On: 01/20/2016 19:13   Micro Results: Recent Results (from the past 240 hour(s))  MRSA PCR Screening     Status: None   Collection Time: 01/20/16  9:52 PM  Result Value Ref Range Status   MRSA by PCR NEGATIVE NEGATIVE Final    Comment:        The GeneXpert MRSA Assay (FDA approved for NASAL specimens only), is one component of a comprehensive MRSA colonization surveillance program. It is not intended to diagnose MRSA infection nor to guide or monitor treatment for MRSA infections.    Studies/Results: Dg Chest Portable 1 View  Result Date: 01/20/2016 CLINICAL DATA:  Generalized weakness.  Nausea .  Lack of appetite. EXAM: PORTABLE CHEST 1 VIEW COMPARISON:  10/31/2015 FINDINGS: Heart size is normal. Mediastinal shadows are normal. Pacemaker/AICD remains in place. The vascularity is normal. The lungs are clear. No effusions. IMPRESSION: No active disease. Electronically Signed   By: Nelson Chimes M.D.   On: 01/20/2016 19:13   Medications:  I have reviewed the patient's current medications Scheduled Meds: . atorvastatin  80 mg Oral Daily  . clonazePAM  0.5 mg Oral BID  . ezetimibe  10 mg Oral Daily  . levothyroxine  12.5 mcg Oral QAC breakfast  . pantoprazole  40 mg Oral BID AC  . potassium chloride SA  20 mEq Oral Daily  . sertraline  100 mg Oral QHS  . [START ON 01/22/2016] torsemide  20 mg Oral QODAY   Continuous Infusions: PRN Meds:.acetaminophen **OR** acetaminophen, ammonium lactate, ondansetron **OR** ondansetron (ZOFRAN) IV,  ondansetron   Assessment/Plan: #1. GI bleed/normocytic   Principal Problem:  Symptomatic anemia Active Problems:   Hypertension   GERD (gastroesophageal reflux disease)   Cardiomyopathy, ischemic- EF 25-30% 2D 09/02/13   Chronic kidney disease, stage 4 (severe) (HCC)   Hypothyroidism   Paroxysmal a-fib (HCC)   Elevated INR   Heme positive stool     LOS: 1 day   [name] 01/21/2016, 10:22 AM

## 2016-01-21 NOTE — Progress Notes (Signed)
PT SIGNED CONSENT FOR EGD BY DR FIELDS IN AM.. WILL BE NPO AFTER MN.

## 2016-01-22 ENCOUNTER — Encounter (HOSPITAL_COMMUNITY)
Admission: EM | Disposition: A | Discharge status: Home / Self Care | Payer: Self-pay | Source: Home / Self Care | Attending: Internal Medicine

## 2016-01-22 ENCOUNTER — Encounter (HOSPITAL_COMMUNITY): Payer: Self-pay | Admitting: Anesthesiology

## 2016-01-22 ENCOUNTER — Inpatient Hospital Stay (HOSPITAL_COMMUNITY): Payer: Medicaid Other | Admitting: Anesthesiology

## 2016-01-22 DIAGNOSIS — D649 Anemia, unspecified: Secondary | ICD-10-CM

## 2016-01-22 DIAGNOSIS — K921 Melena: Principal | ICD-10-CM

## 2016-01-22 DIAGNOSIS — K2970 Gastritis, unspecified, without bleeding: Secondary | ICD-10-CM

## 2016-01-22 HISTORY — PX: GIVENS CAPSULE STUDY: SHX5432

## 2016-01-22 HISTORY — PX: ESOPHAGOGASTRODUODENOSCOPY (EGD) WITH PROPOFOL: SHX5813

## 2016-01-22 LAB — PROTIME-INR
INR: 0.96
Prothrombin Time: 12.7 seconds (ref 11.4–15.2)

## 2016-01-22 LAB — CBC
HEMATOCRIT: 28.6 % — AB (ref 36.0–46.0)
Hemoglobin: 9.2 g/dL — ABNORMAL LOW (ref 12.0–15.0)
MCH: 30.9 pg (ref 26.0–34.0)
MCHC: 32.2 g/dL (ref 30.0–36.0)
MCV: 96 fL (ref 78.0–100.0)
PLATELETS: 168 10*3/uL (ref 150–400)
RBC: 2.98 MIL/uL — ABNORMAL LOW (ref 3.87–5.11)
RDW: 13.3 % (ref 11.5–15.5)
WBC: 6.3 10*3/uL (ref 4.0–10.5)

## 2016-01-22 SURGERY — ESOPHAGOGASTRODUODENOSCOPY (EGD) WITH PROPOFOL
Anesthesia: Monitor Anesthesia Care

## 2016-01-22 MED ORDER — SODIUM CHLORIDE 0.9 % IJ SOLN
INTRAMUSCULAR | Status: AC
  Filled 2016-01-22: qty 10

## 2016-01-22 MED ORDER — MIDAZOLAM HCL 2 MG/2ML IJ SOLN
INTRAMUSCULAR | Status: AC
  Filled 2016-01-22: qty 2

## 2016-01-22 MED ORDER — MIDAZOLAM HCL 5 MG/5ML IJ SOLN
INTRAMUSCULAR | Status: DC | PRN
  Administered 2016-01-22 (×2): 1 mg via INTRAVENOUS

## 2016-01-22 MED ORDER — PROPOFOL 500 MG/50ML IV EMUL
INTRAVENOUS | Status: DC | PRN
Start: 2016-01-22 — End: 2016-01-22
  Administered 2016-01-22: 75 ug/kg/min via INTRAVENOUS

## 2016-01-22 MED ORDER — SUCCINYLCHOLINE CHLORIDE 20 MG/ML IJ SOLN
INTRAMUSCULAR | Status: AC
  Filled 2016-01-22: qty 1

## 2016-01-22 MED ORDER — PANTOPRAZOLE SODIUM 40 MG PO TBEC
40.0000 mg | DELAYED_RELEASE_TABLET | Freq: Every day | ORAL | Status: DC
  Administered 2016-01-23 – 2016-01-25 (×3): 40 mg via ORAL
  Filled 2016-01-22 (×4): qty 1

## 2016-01-22 MED ORDER — LACTATED RINGERS IV SOLN
INTRAVENOUS | Status: DC
  Administered 2016-01-22: 10:00:00 via INTRAVENOUS

## 2016-01-22 MED ORDER — PROPOFOL 10 MG/ML IV BOLUS
INTRAVENOUS | Status: AC
  Filled 2016-01-22: qty 40

## 2016-01-22 MED ORDER — LIDOCAINE VISCOUS 2 % MT SOLN
OROMUCOSAL | Status: AC
  Filled 2016-01-22: qty 15

## 2016-01-22 MED ORDER — EPHEDRINE SULFATE 50 MG/ML IJ SOLN
INTRAMUSCULAR | Status: AC
Start: 2016-01-22 — End: 2016-01-22
  Filled 2016-01-22: qty 1

## 2016-01-22 MED ORDER — LIDOCAINE HCL (PF) 1 % IJ SOLN
INTRAMUSCULAR | Status: AC
  Filled 2016-01-22: qty 5

## 2016-01-22 MED ORDER — LIDOCAINE VISCOUS 2 % MT SOLN
OROMUCOSAL | Status: DC | PRN
  Administered 2016-01-22: 5 mL via OROMUCOSAL

## 2016-01-22 NOTE — Anesthesia Postprocedure Evaluation (Signed)
Anesthesia Post Note  Patient: [name]  Procedure(s) Performed: Procedure(s) (LRB): ESOPHAGOGASTRODUODENOSCOPY (EGD) WITH PROPOFOL (N/A) GIVENS CAPSULE STUDY (N/A)  Patient location during evaluation: PACU Anesthesia Type: MAC Level of consciousness: awake and alert and oriented Pain management: pain level controlled Vital Signs Assessment: post-procedure vital signs reviewed and stable Respiratory status: spontaneous breathing and patient connected to nasal cannula oxygen Cardiovascular status: stable Postop Assessment: no signs of nausea or vomiting Anesthetic complications: no     Last Vitals:  Vitals:   01/22/16 0900 01/22/16 1000  BP:    Pulse: 60 67  Resp: 15 18  Temp:      Last Pain:  Vitals:   01/22/16 0400  TempSrc: Oral  PainSc:                  Halo Laski A

## 2016-01-22 NOTE — Anesthesia Preprocedure Evaluation (Signed)
Anesthesia Evaluation  Patient identified by MRN, date of birth, ID band Patient awake    Reviewed: Allergy & Precautions, NPO status , Patient's Chart, lab work & pertinent test results, reviewed documented beta blocker date and time   Airway Mallampati: II  TM Distance: >3 FB Neck ROM: Full    Dental  (+) Edentulous Upper, Edentulous Lower   Pulmonary Current Smoker,    Pulmonary exam normal breath sounds clear to auscultation       Cardiovascular hypertension, + CAD and +CHF   Rhythm:Regular Rate:Normal + Systolic murmurs Ischemic cardiomyopathy 2D Echo shows EF 20-25%   Neuro/Psych Anxiety Depression History of suicide attempt in 2015 noted in chartPatient reports some left-sided weakness; patient is ambulatory CVA, Residual Symptoms    GI/Hepatic GERD  Medicated and Controlled,  Endo/Other  Hypothyroidism   Renal/GU Renal insufficiency     Musculoskeletal   Abdominal   Peds  Hematology Admitted with Hgb of 5.7 and INR of 10; received 2u PRBCS, Vitamin K and FFP; Today H/H is stable at 9.2/28.6; INR.96   Anesthesia Other Findings Patient i  Reproductive/Obstetrics                             Anesthesia Physical Anesthesia Plan  ASA: III  Anesthesia Plan: MAC   Post-op Pain Management:    Induction:   Airway Management Planned: Simple Face Mask  Additional Equipment:   Intra-op Plan:   Post-operative Plan:   Informed Consent: I have reviewed the patients History and Physical, chart, labs and discussed the procedure including the risks, benefits and alternatives for the proposed anesthesia with the patient or authorized representative who has indicated his/her understanding and acceptance.   Dental advisory given  Plan Discussed with: Anesthesiologist and Surgeon  Anesthesia Plan Comments: (Patient is a DNR; discussed suspension of DNR status during procedure and through  post procedure care; Patient understands and is agreeable.)        Anesthesia Quick Evaluation

## 2016-01-22 NOTE — H&P (Addendum)
Primary Care Physician:  Rosita Fire, MD Primary Gastroenterologist:  Dr. Oneida Alar  Pre-Procedure History & Physical: HPI:  [name] is a 62 y.o. female here for ANEMIA/MELENA FOR 2 WEEKS. NO ASPIRIN, BC/GOODY POWDERS, IBUPROFEN/MOTRIN, OR NAPROXEN/ALEVE. NO IRON OR PEPTO/KAOPECTATE. HAD DIARRHEA WHEN TAKING KEFLEX. HAD VOMITING ONE DAY(FRIDAY) AND IT LOOKED PINK. C/O INTERMITTENT PAIN IN RUQ  THAT ASSOCIATED WITH A BULGE THAT MOVES TO HER EPIGASTRIUM.  PT DENIES FEVER, CHILLS, HEMATOCHEZIA, CHEST PAIN, SHORTNESS OF BREATH,  CHANGE IN BOWEL IN HABITS, constipation, problems swallowing, problems with sedation, OR heartburn or indigestion.   Past Medical History:  Diagnosis Date  . Anemia   . Anxiety 2015   . CAD (coronary artery disease)    a. LHC (04/2013): Chronically occluded LAD, moderate dz in large OM1 and severe dz and small posterior lateral vessel    . chronic systolic heart failure    a. ECHO (05/08/13): EF 20-25%, diff HK with akinesis of basal inferior wall, grade 1 DD, trivial MR, trivial TR b) RHC (05/06/13): RA 7, RV 30/2, PA 31/19 (24), PCWP 10, Fick CO/CI: 2.9/1.74, Thermo CO/Ci: 2.4/1.4, PA sat 53%  . Depression   . Diverticulosis   . DNR (do not resuscitate) 06/05/2015   Confirmed with patient on 06/05/2015  . 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 GERD (gastroesophageal reflux disease) 2015   . History of blood transfusion    "related to losing blood from somewhere; never found out from where"  . Hyperlipidemia 2015  . Hypertension 2015  . Hypothyroidism   . Ischemic cardiomyopathy   . Mass of right breast on mammogram 07/14/2014  . Pancreatitis 1980, 2015   in the setting of ETOH  . Persistent atrial fibrillation ([place]) 2015  . Psoriasis 1968  . Stroke [facility]) 2011   left side weakness, minimal  . Suicidal overdose ([place]) 10/03/13   Overdoes on Beta Blockers & Xarelto ([place])    Past Surgical History:  Procedure Laterality Date  .  AGILE CAPSULE N/A 06/02/2014   Procedure: AGILE CAPSULE;  Surgeon: Danie Binder, MD;  Location: [facility] ENDO SUITE;  Service: Endoscopy;  Laterality: N/A;  0800  . BIOPSY N/A 11/22/2013   Procedure: GASTRIC BIOPSIES;  Surgeon: Danie Binder, MD;  Location: [facility] ORS;  Service: Endoscopy;  Laterality: N/A;  . COLONOSCOPY WITH PROPOFOL N/A 03/29/2014   SLF: 1. No definite source for anemia identified 2. 8 colon polyps removed 3. Severe diverticulosis noted the transverse colon  and right colon 4. Small internal hemorrohids.   . COLOSTOMY REVERSAL  ?2009  . EP IMPLANTABLE DEVICE N/A 09/13/2014   STJ single chamber ICD implanted by Dr Rayann Heman  . EP IMPLANTABLE DEVICE N/A 10/30/2015   STJ CRTD upgrade by Dr Rayann Heman  . ESOPHAGOGASTRODUODENOSCOPY (EGD) WITH PROPOFOL N/A 11/22/2013   Dr. Oneida Alar: gastritis and duodenitis, negative H.pylori  . GIVENS CAPSULE STUDY N/A 06/10/2014   Procedure: GIVENS CAPSULE STUDY;  Surgeon: Danie Binder, MD;  Location: [facility];  Service: Endoscopy;  Laterality: N/A;  0700  . ILEOSTOMY  ?2009  . ILEOSTOMY CLOSURE  ?2010  . LEFT AND RIGHT HEART CATHETERIZATION WITH CORONARY ANGIOGRAM N/A 05/10/2013   Procedure: LEFT AND RIGHT HEART CATHETERIZATION WITH CORONARY ANGIOGRAM;  Surgeon: [name], MD;  Location[facility] CATH LAB;  Service: Cardiovascular;  Laterality: N/A;  . LEFT AND RIGHT HEART CATHETERIZATION WITH CORONARY ANGIOGRAM N/A 04/14/2014   Procedure: LEFT AND RIGHT HEART CATHETERIZATION WITH  CORONARY ANGIOGRAM;  Surgeon: Larey Dresser, MD;  Location: [facility] CATH LAB;  Service: Cardiovascular;  Laterality: N/A;  . LEFT HEART CATHETERIZATION WITH CORONARY ANGIOGRAM N/A 09/03/2013   Procedure: LEFT HEART CATHETERIZATION WITH CORONARY ANGIOGRAM;  Surgeon: Sinclair Grooms, MD;  Location: [facility] CATH LAB;  Service: Cardiovascular;  Laterality: N/A;  . POLYPECTOMY N/A 03/29/2014   Procedure: POLYPECTOMY;  Surgeon: Danie Binder, MD;  Location: [facility] ORS;  Service: Endoscopy;  Laterality:  N/A;  ascending colon, sigmoid colon x4, rectal x3    Prior to Admission medications   Medication Sig Start Date End Date Taking? Authorizing Provider  acetaminophen (TYLENOL) 325 MG tablet Take 650 mg by mouth every 6 (six) hours as needed for mild pain.   Yes Historical Provider, MD  ammonium lactate (AMLACTIN) 12 % cream Apply 1 g topically daily as needed for dry skin (apply to elbows).    Yes Historical Provider, MD  atorvastatin (LIPITOR) 80 MG tablet TAKE 1 TABLET BY MOUTH ONCE DAILY. 10/23/15  Yes Larey Dresser, MD  calcium carbonate (OS-CAL - DOSED IN MG OF ELEMENTAL CALCIUM) 1250 (500 Ca) MG tablet Take 1 tablet (500 mg of elemental calcium total) by mouth 3 (three) times daily with meals. 02/24/15  Yes Rexene Alberts, MD  carvedilol (COREG) 12.5 MG tablet TAKE 1 TABLET BY MOUTH TWICE DAILY. 10/23/15  Yes Larey Dresser, MD  clonazePAM (KLONOPIN) 0.5 MG tablet Take 0.5 mg by mouth 2 (two) times daily.   Yes Historical Provider, MD  ezetimibe (ZETIA) 10 MG tablet TAKE 1 TABLET BY MOUTH ONCE DAILY. 10/23/15  Yes Larey Dresser, MD  levothyroxine (SYNTHROID, LEVOTHROID) 25 MCG tablet TAKE 1/2 TABLET BY MOUTH BEFORE BREAKFAST. 10/23/15  Yes Larey Dresser, MD  lisinopril (PRINIVIL,ZESTRIL) 2.5 MG tablet Take 1 tablet (2.5 mg total) by mouth daily. D/C order for Hydralazine 12/28/15 03/27/16 Yes Josalyn Funches, MD  nitroGLYCERIN (NITROSTAT) 0.3 MG SL tablet Place 0.3 mg under the tongue every 5 (five) minutes as needed for chest pain (MAX 3 TABLETS). Reported on 07/18/2015   Yes Historical Provider, MD  ondansetron (ZOFRAN) 4 MG tablet Take 4 mg by mouth every 8 (eight) hours as needed for nausea or vomiting. Reported on 07/18/2015   Yes Historical Provider, MD  pantoprazole (PROTONIX) 40 MG tablet TAKE 1 TABLET BY MOUTH TWICE DAILY BEFORE A MEAL. 10/23/15  Yes Larey Dresser, MD  potassium chloride SA (K-DUR,KLOR-CON) 20 MEQ tablet TAKE 1 TABLET BY MOUTH ONCE DAILY. 10/23/15  Yes Larey Dresser, MD   sertraline (ZOLOFT) 50 MG tablet TAKE 2 TABLETS (=TO A 100MG  DOSE) BY MOUTH ONCE DAILY. 10/23/15  Yes Larey Dresser, MD  torsemide (DEMADEX) 20 MG tablet Take 1 tablet (20 mg total) by mouth every other day. 09/29/15  Yes Larey Dresser, MD  warfarin (COUMADIN) 2.5 MG tablet Take 2.5 mg by mouth daily. Take 1 and 1/2 daily on Tuesday, Thursday , Saturday and Sunday only   Yes Historical Provider, MD  warfarin (COUMADIN) 5 MG tablet Take 5 mg by mouth daily. Take on Monday Wednesday and friday   Yes Historical Provider, MD  warfarin (COUMADIN) 7.5 MG tablet Take 3.75 mg by mouth daily.   Yes Historical Provider, MD    Allergies as of 01/20/2016 - Review Complete 01/20/2016  Allergen Reaction Noted  . Morphine and related Other (See Comments) 09/24/2013  . Penicillins Anaphylaxis and Rash 05/06/2013    Family History  Problem Relation Age of Onset  .  CAD Father 35    MI  . CAD Mother 75    MI  . Alcohol abuse Mother   . Colon cancer Mother     in her 57s  . CAD Sister 59    Stent  . Diabetes Sister   . CAD Brother 11    MI  . Cancer Neg Hx     Social History   Social History  . Marital status: Married    Spouse name: N/A  . Number of children: 2  . Years of education: 10   Occupational History  . Unemployed     Social History Main Topics  . Smoking status: Current Some Day Smoker    Packs/day: 0.20    Years: 48.00    Types: Cigarettes  . Smokeless tobacco: Never Used  . Alcohol use No     Comment: last drink in [id number], former alcoholic  . Drug use: No  . Sexual activity: No   Other Topics Concern  . Not on file   Social History Narrative   Lived previously with her spouse in an [place].     Son and daughter   Daughter in [place]   Son in [place]   Two grandchildren.       Presently in a group home [place], moved in 10/13/13.           Review of Systems: See HPI, otherwise negative ROS   Physical Exam: BP 99/64   Pulse 60   Temp 97.7 F  (36.5 C) (Oral)   Resp 15   Ht 5\' 3"  (1.6 m)   Wt 122 lb 5.7 oz (55.5 kg)   LMP 01/22/1988 (Approximate)   SpO2 98%   BMI 21.67 kg/m  General:   Alert,  pleasant and cooperative in NAD Head:  Normocephalic and atraumatic. Neck:  Supple;CHEST-PACER POCKET IN LEFT UPPER CHEST Lungs:  Clear throughout to auscultation.    Heart:  Regular rate and rhythm. Abdomen:  Soft, nontender and nondistended. Normal bowel sounds, without guarding, and without rebound.   Neurologic:  Alert and  oriented x4;  grossly normal neurologically.  Impression/Plan:     Anemia/MELENA  PLAN:  1. EGD/POSSOBLE GIVENS CAPSULE TODAY. DISCUSSED PROCEDURE, BENEFITS, & RISKS: < 1% chance of medication reaction, PERFORATION, GIVENS INTERFERING WITH PACER/DEFIBRILLATOR, capsule retention, OR bleeding.

## 2016-01-22 NOTE — Transfer of Care (Signed)
Immediate Anesthesia Transfer of Care Note  Patient: [name]  Procedure(s) Performed: Procedure(s): ESOPHAGOGASTRODUODENOSCOPY (EGD) WITH PROPOFOL (N/A) GIVENS CAPSULE STUDY (N/A)  Patient Location: PACU  Anesthesia Type:MAC  Level of Consciousness: awake, alert , oriented and patient cooperative  Airway & Oxygen Therapy: Patient Spontanous Breathing and Patient connected to nasal cannula oxygen  Post-op Assessment: Report given to RN and Post -op Vital signs reviewed and stable  Post vital signs: Reviewed and stable  Last Vitals:  Vitals:   01/22/16 0900 01/22/16 1000  BP:    Pulse: 60 67  Resp: 15 18  Temp:      Last Pain:  Vitals:   01/22/16 0400  TempSrc: Oral  PainSc:          Complications: No apparent anesthesia complications

## 2016-01-22 NOTE — Progress Notes (Signed)
  [name] [id number] DOB: 09/06/1955 DOA: 01/20/2016 PCP: Rosita Fire, MD   Subjective: Patient underwent EGD earlier today. Findings do not give a source for the GI bleed. She has a medium-sized hiatal hernia and gastritis. She will have Givens capsule study. She feels much improved after the blood transfusion.           Physical Exam: Blood pressure (!) 83/65, pulse 64, temperature 98.3 F (36.8 C), resp. rate 17, height 5\' 3"  (1.6 m), weight 55.5 kg (122 lb 5.7 oz), last menstrual period 01/22/1988, SpO2 100 %. She looks systemic the well. Blood pressure slightly soft post procedure but she is well perfused in her peripheries. Abdomen is soft and nontender. No other new physical findings. She is alert and oriented.   Investigations:  Recent Results (from the past 240 hour(s))  MRSA PCR Screening     Status: None   Collection Time: 01/20/16  9:52 PM  Result Value Ref Range Status   MRSA by PCR NEGATIVE NEGATIVE Final    Comment:        The GeneXpert MRSA Assay (FDA approved for NASAL specimens only), is one component of a comprehensive MRSA colonization surveillance program. It is not intended to diagnose MRSA infection nor to guide or monitor treatment for MRSA infections.      Basic Metabolic Panel:  Recent Labs  01/20/16 1754 01/21/16 0412  NA 138 140  K 4.8 4.5  CL 108 112*  CO2 23 22  GLUCOSE 112* 69  BUN 52* 52*  CREATININE 1.95* 1.72*  CALCIUM 9.4 8.2*   Liver Function Tests:  Recent Labs  01/20/16 1754  AST 23  ALT 19  ALKPHOS 43  BILITOT 0.4  PROT 7.3  ALBUMIN 4.1     CBC:  Recent Labs  01/20/16 1754 01/21/16 0412 01/22/16 0511  WBC 6.1 4.6 6.3  NEUTROABS 4.0  --   --   HGB 5.7* 9.2* 9.2*  HCT 17.5* 27.2* 28.6*  MCV 93.6 94.8 96.0  PLT 252 159 168    Dg Chest Portable 1 View  Result Date: 01/20/2016 CLINICAL DATA:  Generalized weakness.  Nausea .  Lack of appetite. EXAM: PORTABLE CHEST 1 VIEW COMPARISON:   10/31/2015 FINDINGS: Heart size is normal. Mediastinal shadows are normal. Pacemaker/AICD remains in place. The vascularity is normal. The lungs are clear. No effusions. IMPRESSION: No active disease. Electronically Signed   By: Nelson Chimes M.D.   On: 01/20/2016 19:13      Medications: I have reviewed the patient's current medications.  Impression:  Principal Problem:   Symptomatic anemia Active Problems:   Hypertension   GERD (gastroesophageal reflux disease)   Cardiomyopathy, ischemic- EF 25-30% 2D 09/02/13   Chronic kidney disease, stage 4 (severe) (HCC)   Hypothyroidism   Paroxysmal a-fib (HCC)   Elevated INR   Heme positive stool     Plan: 1. Continue with current therapy per gastroenterology. 2. Repeat CBC tomorrow morning to make sure there is stability of the hemoglobin. 3. Await the Givens capsule study.  Consultants:  Gastroenterology.   Procedures:  EGD.   Antibiotics:  None.                   Code Status: Full code.  Family Communication: I discussed the plan with the patient at the bedside.   Disposition Plan: Home when medically stable.  Time spent: 15 minutes.   LOS: 2 days   [place] C   01/22/2016, 12:27 PM

## 2016-01-22 NOTE — Op Note (Signed)
[facility] Patient Name: [name] Procedure Date: 01/22/2016 9:49 AM MRN: [id number] Date of Birth: [birth year] Attending MD: Barney Drain , MD CSN: [id number] Age: 60 Admit Type: Outpatient Procedure:                Upper GI endoscopy Indications:              Melena FOR 2 WEEKS. PRESENTED WITH INR 10 Hb 5.7 ON                            COUMADIN FOR ISCHEMC CARDIOMYOPATHY. LAST TCS MAR                            2016. LAST GIVENS JUN 2016 FOR ANEMIA-JEJUNAL                            ULCERS SEEN. Providers:                Barney Drain, MD, Gwynneth Albright RN, RN,                            Isabella Stalling, Technician Referring MD:             Rosita Fire Medicines:                Propofol per Anesthesia Complications:            No immediate complications. Estimated Blood Loss:     Estimated blood loss: none. Procedure:                Pre-Anesthesia Assessment:                           - Prior to the procedure, a History and Physical                            was performed, and patient medications and                            allergies were reviewed. The patient's tolerance of                            previous anesthesia was also reviewed. The risks                            and benefits of the procedure and the sedation                            options and risks were discussed with the patient.                            All questions were answered, and informed consent                            was obtained. Prior Anticoagulants: The patient has  taken Coumadin (warfarin), last dose was 1 day                            prior to procedure. ASA Grade Assessment: III - A                            patient with severe systemic disease. After                            reviewing the risks and benefits, the patient was                            deemed in satisfactory condition to undergo the                            procedure. After  obtaining informed consent, the                            endoscope was passed under direct vision.                            Throughout the procedure, the patient's blood                            pressure, pulse, and oxygen saturations were                            monitored continuously. The [id number]) was                            introduced through the mouth, and advanced to the                            second part of duodenum. The upper GI endoscopy was                            accomplished without difficulty. The patient                            tolerated the procedure well. Scope In: 10:45:08 AM Scope Out: 10:59:50 AM Total Procedure Duration: 0 hours 14 minutes 42 seconds  Findings:      A widely patent Schatzki ring (acquired) was found at the       gastroesophageal junction.      A medium-sized hiatal hernia was present.      Patchy mild inflammation characterized by congestion (edema) and       erythema was found in the gastric antrum.      The examined duodenum was normal. Using the endoscope, the video capsule       enteroscope was advanced into the second portion of the duodenum. Impression:               - Widely patent Schatzki ring.                           -  Medium-sized hiatal hernia.                           - Gastritis.                           - NO SOURCE FOR MELENA/ANEMIA IDENTIFIED                           - Successful completion of the Video Capsule                            Enteroscope placement. Moderate Sedation:      Per Anesthesia Care Recommendation:           - Return patient to hospital ward for ongoing care.                            PT REQUIRES TELEMETRY UNTIL GIVENS STUDY COMPLETE.                           - NPO for 4 hours.                           - Continue present medications.                           - Return to my office in 3 months. Procedure Code(s):        --- Professional ---                           [phone],  Esophagogastroduodenoscopy, flexible,                            transoral; diagnostic, including collection of                            specimen(s) by brushing or washing, when performed                            (separate procedure) Diagnosis Code(s):        --- Professional ---                           K22.2, Esophageal obstruction                           K44.9, Diaphragmatic hernia without obstruction or                            gangrene                           K29.70, Gastritis, unspecified, without bleeding                           K92.1, Melena (includes Hematochezia)  D64.9, Anemia, unspecified CPT copyright 2016 American Medical Association. All rights reserved. The codes documented in this report are preliminary and upon coder review may  be revised to meet current compliance requirements. Barney Drain, MD Barney Drain, MD 01/22/2016 11:14:05 AM This report has been signed electronically. Number of Addenda: 0

## 2016-01-22 NOTE — Anesthesia Procedure Notes (Signed)
Procedure Name: MAC Date/Time: 01/22/2016 10:07 AM Performed by: Andree Elk, Aquilla Shambley A Pre-anesthesia Checklist: Patient identified, Emergency Drugs available, Suction available and Patient being monitored Patient Re-evaluated:Patient Re-evaluated prior to inductionOxygen Delivery Method: Simple face mask

## 2016-01-23 ENCOUNTER — Telehealth: Payer: Self-pay | Admitting: Licensed Clinical Social Worker

## 2016-01-23 ENCOUNTER — Encounter (HOSPITAL_COMMUNITY): Payer: Medicaid Other

## 2016-01-23 LAB — PROTIME-INR
INR: 0.97
Prothrombin Time: 12.9 seconds (ref 11.4–15.2)

## 2016-01-23 MED ORDER — ACETAMINOPHEN 325 MG PO TABS: 650.0000 mg | ORAL_TABLET | Freq: Once | ORAL | Status: DC

## 2016-01-23 MED ORDER — SODIUM CHLORIDE 0.9% FLUSH: 3.0000 mL | INTRAVENOUS | Status: DC | PRN

## 2016-01-23 MED ORDER — SODIUM CHLORIDE 0.9 % IV SOLN: 250.0000 mL | Freq: Once | INTRAVENOUS | Status: DC

## 2016-01-23 MED ORDER — HEPARIN SOD (PORK) LOCK FLUSH 100 UNIT/ML IV SOLN: 250.0000 [IU] | INTRAVENOUS | Status: DC | PRN

## 2016-01-23 MED ORDER — HEPARIN SOD (PORK) LOCK FLUSH 100 UNIT/ML IV SOLN: 500.0000 [IU] | Freq: Every day | INTRAVENOUS | Status: DC | PRN

## 2016-01-23 MED ORDER — SODIUM CHLORIDE 0.9% FLUSH: 10.0000 mL | INTRAVENOUS | Status: DC | PRN

## 2016-01-23 MED ORDER — DIPHENHYDRAMINE HCL 25 MG PO CAPS: 25.0000 mg | ORAL_CAPSULE | Freq: Once | ORAL | Status: DC

## 2016-01-23 MED ORDER — WARFARIN - PHARMACIST DOSING INPATIENT
Status: DC
  Administered 2016-01-23 – 2016-01-24 (×2)

## 2016-01-23 MED ORDER — WARFARIN SODIUM 5 MG PO TABS
5.0000 mg | ORAL_TABLET | Freq: Once | ORAL | Status: AC
  Administered 2016-01-23: 5 mg via ORAL
  Filled 2016-01-23: qty 1

## 2016-01-23 NOTE — Progress Notes (Signed)
Subjective: Patient feels better. She is not dizzy now. She was transfused and EGD was done. No site of active bleeding was admitted. Patient had supra therapeutic INR on admission. Her coumadin on hold since admission.  Objective: Vital signs in last 24 hours: Temp:  [97.9 F (36.6 C)-98.8 F (37.1 C)] 98.1 F (36.7 C) (01/02 0732) Pulse Rate:  [60-71] 71 (01/02 0750) Resp:  [13-23] 23 (01/02 0750) BP: (70-102)/(44-82) 98/78 (01/02 0732) SpO2:  [79 %-100 %] 79 % (01/02 0750) Weight:  [54.8 kg (120 lb 13 oz)] 54.8 kg (120 lb 13 oz) (01/02 0500) Weight change: -0.7 kg (-1 lb 8.7 oz) Last BM Date: 01/20/16  Intake/Output from previous day: 01/01 0701 - 01/02 0700 In: 400 [I.V.:400] Out: 300 [Urine:300]  PHYSICAL EXAM General appearance: alert and no distress Resp: clear to auscultation bilaterally Cardio: S1, S2 normal GI: soft, non-tender; bowel sounds normal; no masses,  no organomegaly Extremities: extremities normal, atraumatic, no cyanosis or edema  Lab Results:  Results for orders placed or performed during the hospital encounter of 01/20/16 (from the past 48 hour(s))  Protime-INR     Status: None   Collection Time: 01/21/16 11:41 AM  Result Value Ref Range   Prothrombin Time 13.1 11.4 - 15.2 seconds   INR 0.99   Protime-INR     Status: None   Collection Time: 01/21/16  4:48 PM  Result Value Ref Range   Prothrombin Time 12.5 11.4 - 15.2 seconds   INR 0.93   Protime-INR     Status: None   Collection Time: 01/21/16 10:32 PM  Result Value Ref Range   Prothrombin Time 12.7 11.4 - 15.2 seconds   INR 0.95   Protime-INR     Status: None   Collection Time: 01/22/16  5:11 AM  Result Value Ref Range   Prothrombin Time 12.7 11.4 - 15.2 seconds   INR 0.96   CBC     Status: Abnormal   Collection Time: 01/22/16  5:11 AM  Result Value Ref Range   WBC 6.3 4.0 - 10.5 K/uL   RBC 2.98 (L) 3.87 - 5.11 MIL/uL   Hemoglobin 9.2 (L) 12.0 - 15.0 g/dL   HCT 28.6 (L) 36.0 - 46.0 %    MCV 96.0 78.0 - 100.0 fL   MCH 30.9 26.0 - 34.0 pg   MCHC 32.2 30.0 - 36.0 g/dL   RDW 13.3 11.5 - 15.5 %   Platelets 168 150 - 400 K/uL  Protime-INR     Status: None   Collection Time: 01/23/16  4:29 AM  Result Value Ref Range   Prothrombin Time 12.9 11.4 - 15.2 seconds   INR 0.97     ABGS No results for input(s): PHART, PO2ART, TCO2, HCO3 in the last 72 hours.  Invalid input(s): PCO2 CULTURES Recent Results (from the past 240 hour(s))  MRSA PCR Screening     Status: None   Collection Time: 01/20/16  9:52 PM  Result Value Ref Range Status   MRSA by PCR NEGATIVE NEGATIVE Final    Comment:        The GeneXpert MRSA Assay (FDA approved for NASAL specimens only), is one component of a comprehensive MRSA colonization surveillance program. It is not intended to diagnose MRSA infection nor to guide or monitor treatment for MRSA infections.    Studies/Results: No results found.  Medications: I have reviewed the patient's current medications.  Assesment:  Principal Problem:   Symptomatic anemia Active Problems:   Hypertension   GERD (  gastroesophageal reflux disease)   Cardiomyopathy, ischemic- EF 25-30% 2D 09/02/13   Chronic kidney disease, stage 4 (severe) (HCC)   Hypothyroidism   Paroxysmal a-fib (HCC)   Elevated INR   Heme positive stool    Plan:  Medications reviewed Will continue current treatment according GI recommendation.  Will do pharmacy consult to restart her coumadin. Will monitor CBC.    LOS: 3 days   [name] 01/23/2016, 8:08 AM

## 2016-01-23 NOTE — Anesthesia Postprocedure Evaluation (Signed)
Anesthesia Post Note  Patient: [name]  Procedure(s) Performed: Procedure(s) (LRB): ESOPHAGOGASTRODUODENOSCOPY (EGD) WITH PROPOFOL (N/A) GIVENS CAPSULE STUDY (N/A)  Patient location during evaluation: ICU Anesthesia Type: MAC Level of consciousness: awake and alert Pain management: satisfactory to patient Vital Signs Assessment: post-procedure vital signs reviewed and stable Respiratory status: spontaneous breathing and patient connected to nasal cannula oxygen Cardiovascular status: stable Anesthetic complications: no     Last Vitals:  Vitals:   01/23/16 0732 01/23/16 0750  BP: 98/78   Pulse: 68 71  Resp: 19 (!) 23  Temp: 36.7 C     Last Pain:  Vitals:   01/23/16 0732  TempSrc: Oral  PainSc:                  Drucie Opitz

## 2016-01-23 NOTE — Progress Notes (Signed)
    Subjective: No abdominal pain, N/V, overt GI bleeding. Tolerating diet.   Objective: Vital signs in last 24 hours: Temp:  [97.9 F (36.6 C)-98.8 F (37.1 C)] 98.1 F (36.7 C) (01/02 0732) Pulse Rate:  [60-71] 71 (01/02 0750) Resp:  [13-23] 23 (01/02 0750) BP: (70-102)/(44-82) 98/78 (01/02 0732) SpO2:  [79 %-100 %] 79 % (01/02 0750) Weight:  [120 lb 13 oz (54.8 kg)] 120 lb 13 oz (54.8 kg) (01/02 0500) Last BM Date: 01/20/16 General:   Alert and oriented, pleasant Head:  Normocephalic and atraumatic. Eyes:  No icterus, sclera clear. Conjuctiva pink.  Abdomen:  Bowel sounds present, soft, non-tender, non-distended. No HSM or hernias noted. No rebound or guarding.  Extremities:  Without edema. Neurologic:  Alert and  oriented x4 Psych:  Alert and cooperative. Normal mood and affect.  Intake/Output from previous day: 01/01 0701 - 01/02 0700 In: 400 [I.V.:400] Out: 300 [Urine:300] Intake/Output this shift: No intake/output data recorded.  Lab Results:  Recent Labs  01/20/16 1754 01/21/16 0412 01/22/16 0511  WBC 6.1 4.6 6.3  HGB 5.7* 9.2* 9.2*  HCT 17.5* 27.2* 28.6*  PLT 252 159 168   BMET  Recent Labs  01/20/16 1754 01/21/16 0412  NA 138 140  K 4.8 4.5  CL 108 112*  CO2 23 22  GLUCOSE 112* 69  BUN 52* 52*  CREATININE 1.95* 1.72*  CALCIUM 9.4 8.2*   LFT  Recent Labs  01/20/16 1754  PROT 7.3  ALBUMIN 4.1  AST 23  ALT 19  ALKPHOS 43  BILITOT 0.4   PT/INR  Recent Labs  01/22/16 0511 01/23/16 0429  LABPROT 12.7 12.9  INR 0.96 0.97    Assessment: 61 year old female admitted with profound anemia, melena, supratherapeutic INR in the setting of Coumadin. EGD 01/22/16 with widely patent Schatzki's ring, gastric erythema and edema, examined duodenum normal. Capsule placed yesterday and to be read today. No further overt GI bleeding, tolerating diet. Hgb remaining stable overall with last check.     Plan: Capsule study to be read today CBC in  am Further recommendations to follow   Annitta Needs, ANP-BC [facility]    LOS: 3 days    01/23/2016, 8:02 AM

## 2016-01-23 NOTE — Telephone Encounter (Signed)
CSW received call from patient's sister Santiago Glad to inform that patient is hospitalized at [facility]. Patient's sister concerned about patient and hopeful for quick discharge. Patient had appointment today in the HF clinic but obviously cancelled and will reschedule once discharged from the hospital. CSW provided supportive intervention to sister and will follow up with patient on next clinic visit. Raquel Sarna, LCSW [phone]

## 2016-01-23 NOTE — Procedures (Signed)
Small Bowel [place] Capsule Study Procedure date:  01/22/16 Findings reviewed: 01/23/16   Referring Provider:  Dr. Oneida Alar  PCP:  Dr. Rosita Fire, MD  Indication for procedure:   Profound anemia, melena, EGD with widely patent Schatzki's ring, gastric erythema and edema, duodenum normal. Capsule placed now for further evaluation of small bowel.     Findings:   Capsule was complete to the cecum. Possible few ulcers in proximal small bowel but no overt GI bleeding, mass, AVMs appreciated. Overall poor quality and visualization limited by debris.   First Duodenal image: approximately 00:00:55  First Cecal image: 01:05:34 Small Bowel Passage time:  01h 66m  Summary & Recommendations: Although capsule study was complete, identification of source for GI bleeding difficult due to visualization limited by debris. Possible few ulcers in proximal small bowel. Last colonoscopy in March 2016 with multiple simple adenomas and surveillance due in 2019. Capsule study also previously completed May 2016 with similar findings. Recommend absolute avoidance of ASA, NSAIDs. Close monitoring of labs as outpatient; may need to consider updated colonoscopy if persistent transfusion dependent anemia, overt GI bleeding. To review with Dr. Oneida Alar.    Annitta Needs, ANP-BC [facility]

## 2016-01-23 NOTE — Addendum Note (Signed)
Addendum  created 01/23/16 [id number] by Vista Deck, CRNA   Sign clinical note

## 2016-01-23 NOTE — Progress Notes (Signed)
ANTICOAGULATION CONSULT NOTE - Initial Consult  Pharmacy Consult for Coumadin (PTA med) Indication: atrial fibrillation  Allergies  Allergen Reactions  . Morphine And Related Other (See Comments)    Dizziness and hallucinations  . Penicillins Anaphylaxis and Rash    Has patient had a PCN reaction causing immediate rash, facial/tongue/throat swelling, SOB or lightheadedness with hypotension:unknown Has patient had a PCN reaction causing severe rash involving mucus membranes or skin necrosis: unknown Has patient had a PCN reaction that required hospitalization unknown Has patient had a PCN reaction occurring within the last 10 years: unknown If all of the above answers are "NO", then may proceed with Cephalosporin use.    Patient Measurements: Height: 5\' 3"  (160 cm) Weight: 120 lb 13 oz (54.8 kg) IBW/kg (Calculated) : 52.4  Vital Signs: Temp: 98.1 F (36.7 C) (01/02 0732) Temp Source: Oral (01/02 0732) BP: 98/78 (01/02 0732) Pulse Rate: 71 (01/02 0750)  Labs:  Recent Labs  01/20/16 1754 01/21/16 0412  01/21/16 2232 01/22/16 0511 01/23/16 0429  HGB 5.7* 9.2*  --   --  9.2*  --   HCT 17.5* 27.2*  --   --  28.6*  --   PLT 252 159  --   --  168  --   LABPROT  --  13.7  < > 12.7 12.7 12.9  INR  --  1.05  < > 0.95 0.96 0.97  CREATININE 1.95* 1.72*  --   --   --   --   < > = values in this interval not displayed.  Estimated Creatinine Clearance: 28.8 mL/min (by C-G formula based on SCr of 1.72 mg/dL (H)).  Medical History: Past Medical History:  Diagnosis Date  . Anemia   . Anxiety 2015   . CAD (coronary artery disease)    a. LHC (04/2013): Chronically occluded LAD, moderate dz in large OM1 and severe dz and small posterior lateral vessel    . chronic systolic heart failure    a. ECHO (05/08/13): EF 20-25%, diff HK with akinesis of basal inferior wall, grade 1 DD, trivial MR, trivial TR b) RHC (05/06/13): RA 7, RV 30/2, PA 31/19 (24), PCWP 10, Fick CO/CI: 2.9/1.74, Thermo  CO/Ci: 2.4/1.4, PA sat 53%  . Depression   . Diverticulosis   . DNR (do not resuscitate) 06/05/2015   Confirmed with patient on 06/05/2015  . ETOH abuse 1981   started abusing alcohol at age 34, quit   . GERD (gastroesophageal reflux disease) 2015   . History of blood transfusion    "related to losing blood from somewhere; never found out from where"  . Hyperlipidemia 2015  . Hypertension 2015  . Hypothyroidism   . Ischemic cardiomyopathy   . Mass of right breast on mammogram 07/14/2014  . Pancreatitis 1980, 2015   in the setting of ETOH  . Persistent atrial fibrillation ([place]) 2015  . Psoriasis 1968  . Stroke [facility]) 2011   left side weakness, minimal  . Suicidal overdose ([place]) 10/03/13   [place] on Beta Blockers & Xarelto ([place])   Medications:  Prescriptions Prior to Admission  Medication Sig Dispense Refill Last Dose  . acetaminophen (TYLENOL) 325 MG tablet Take 650 mg by mouth every 6 (six) hours as needed for mild pain.   Taking  . ammonium lactate (AMLACTIN) 12 % cream Apply 1 g topically daily as needed for dry skin (apply to elbows).    Taking  . atorvastatin (LIPITOR) 80 MG tablet TAKE 1 TABLET BY MOUTH ONCE  DAILY. 30 tablet 3 01/20/2016 at Unknown time  . calcium carbonate (OS-CAL - DOSED IN MG OF ELEMENTAL CALCIUM) 1250 (500 Ca) MG tablet Take 1 tablet (500 mg of elemental calcium total) by mouth 3 (three) times daily with meals. 90 tablet 3 01/20/2016 at Unknown time  . carvedilol (COREG) 12.5 MG tablet TAKE 1 TABLET BY MOUTH TWICE DAILY. 60 tablet 3 01/20/2016 at 800  . clonazePAM (KLONOPIN) 0.5 MG tablet Take 0.5 mg by mouth 2 (two) times daily.   Past Week at Unknown time  . ezetimibe (ZETIA) 10 MG tablet TAKE 1 TABLET BY MOUTH ONCE DAILY. 30 tablet 3 01/20/2016 at Unknown time  . levothyroxine (SYNTHROID, LEVOTHROID) 25 MCG tablet TAKE 1/2 TABLET BY MOUTH BEFORE BREAKFAST. 15 tablet 3 01/20/2016 at Unknown time  . lisinopril (PRINIVIL,ZESTRIL) 2.5 MG tablet Take 1  tablet (2.5 mg total) by mouth daily. D/C order for Hydralazine 30 tablet 6 01/20/2016 at Unknown time  . nitroGLYCERIN (NITROSTAT) 0.3 MG SL tablet Place 0.3 mg under the tongue every 5 (five) minutes as needed for chest pain (MAX 3 TABLETS). Reported on 07/18/2015   Taking  . ondansetron (ZOFRAN) 4 MG tablet Take 4 mg by mouth every 8 (eight) hours as needed for nausea or vomiting. Reported on 07/18/2015   Taking  . pantoprazole (PROTONIX) 40 MG tablet TAKE 1 TABLET BY MOUTH TWICE DAILY BEFORE A MEAL. 60 tablet 3 01/20/2016 at Unknown time  . potassium chloride SA (K-DUR,KLOR-CON) 20 MEQ tablet TAKE 1 TABLET BY MOUTH ONCE DAILY. 30 tablet 3 01/20/2016 at Unknown time  . sertraline (ZOLOFT) 50 MG tablet TAKE 2 TABLETS (=TO A 100MG  DOSE) BY MOUTH ONCE DAILY. 60 tablet 3 01/20/2016 at Unknown time  . torsemide (DEMADEX) 20 MG tablet Take 1 tablet (20 mg total) by mouth every other day. 15 tablet 2 01/20/2016 at Unknown time  . warfarin (COUMADIN) 2.5 MG tablet Take 2.5 mg by mouth daily. Take 1 and 1/2 daily on Tuesday, Thursday , Saturday and Sunday only   01/20/2016 at 1700time  . warfarin (COUMADIN) 5 MG tablet Take 5 mg by mouth daily. Take on Monday Wednesday and friday   01/19/2016 at 1700time  . warfarin (COUMADIN) 7.5 MG tablet Take 3.75 mg by mouth daily.   01/19/2016 at 1700   Assessment: 61yo female on chronic Coumadin PTA.  INR was SUPRAtherapeutic ( > 10) on admission and was reversed with KCentra and Vitamin K on 12/30.  GI w/u completed and no bleeding found.  Asked to resume Warfarin.  INR response initially will likely be sluggish given administration of reversal agents.   Goal of Therapy:  INR 2-3 Monitor platelets by anticoagulation protocol: Yes   Plan:  Coumadin 5mg  po today x 1 INR daily  Desirai Traxler A 01/23/2016,10:51 AM

## 2016-01-24 ENCOUNTER — Encounter (HOSPITAL_COMMUNITY): Payer: Self-pay | Admitting: Gastroenterology

## 2016-01-24 DIAGNOSIS — D509 Iron deficiency anemia, unspecified: Secondary | ICD-10-CM

## 2016-01-24 DIAGNOSIS — R791 Abnormal coagulation profile: Secondary | ICD-10-CM

## 2016-01-24 LAB — CBC
HCT: 31.2 % — ABNORMAL LOW (ref 36.0–46.0)
HEMOGLOBIN: 10.5 g/dL — AB (ref 12.0–15.0)
MCH: 32.1 pg (ref 26.0–34.0)
MCHC: 33.7 g/dL (ref 30.0–36.0)
MCV: 95.4 fL (ref 78.0–100.0)
PLATELETS: 186 10*3/uL (ref 150–400)
RBC: 3.27 MIL/uL — ABNORMAL LOW (ref 3.87–5.11)
RDW: 13.5 % (ref 11.5–15.5)
WBC: 5 10*3/uL (ref 4.0–10.5)

## 2016-01-24 LAB — PROTIME-INR
INR: 1.07
PROTHROMBIN TIME: 13.9 s (ref 11.4–15.2)

## 2016-01-24 MED ORDER — WARFARIN SODIUM 5 MG PO TABS
5.0000 mg | ORAL_TABLET | Freq: Once | ORAL | Status: AC
Start: 2016-01-24 — End: 2016-01-24
  Administered 2016-01-24: 5 mg via ORAL
  Filled 2016-01-24: qty 1

## 2016-01-24 NOTE — Progress Notes (Signed)
[place] for discharge from GI standpoint per Neil Crouch, PA. Dr.Fanta notified and made aware and order given to transfer patient to dept 300 telemetry d/t low INR.

## 2016-01-24 NOTE — Clinical Social Work Note (Signed)
Clinical Social Work Assessment  Patient Details  Name: [name] MRN: [id number] Date of Birth: February 20, 1955  Date of referral:  01/24/16               Reason for consult:  Discharge Planning                Permission sought to share information with:    Permission granted to share information::     Name::        Agency::     Relationship::     Contact Information:  Webb Silversmith, facility staff.   Housing/Transportation Living arrangements for the past 2 months:  [place] of Information:  Patient, Facility Patient Interpreter Needed:  None Criminal Activity/Legal Involvement Pertinent to Current Situation/Hospitalization:  No - Comment as needed Significant Relationships:  None Lives with:  Facility Resident Do you feel safe going back to the place where you live?  Yes Need for family participation in patient care:  Yes (Comment)  Care giving concerns:  None identified.    Social Worker assessment / plan:  Patient stated that she has been a resident at [facility] since April. Patient transferred to [facility] after [facility] down.  Patient ambulates unassisted and completes ADLs unassisted.  Patient plans to return to [facility] at discharge. Patient has no support system. CSW spoke to [place] at [facility]. She confirmed patient's statements and stated that patient can return at discharge.   Employment status:  Disabled (Comment on whether or not currently receiving Disability) Insurance information:  Self Pay (Medicaid Pending) PT Recommendations:  Not assessed at this time Information / Referral to community resources:     Patient/Family's Response to care:  Patient is agreeable to return to facility at discharge.  Patient/Family's Understanding of and Emotional Response to Diagnosis, Current Treatment, and Prognosis:  Patient understsands her diagnosis, treatment and prognosis.   Emotional Assessment Appearance:  Appears stated  age Attitude/Demeanor/Rapport:   (Cooperative) Affect (typically observed):  Calm Orientation:  Oriented to Self, Oriented to Place, Oriented to  Time, Oriented to Situation Alcohol / Substance use:  Never Used Psych involvement (Current and /or in the community):  No (Comment)  Discharge Needs  Concerns to be addressed:  Discharge Planning Concerns Readmission within the last 30 days:  No Current discharge risk:  None Barriers to Discharge:  No Barriers Identified   Ihor Gully, LCSW 01/24/2016, 10:49 AM

## 2016-01-24 NOTE — Progress Notes (Signed)
Subjective:  No overt GI bleeding since admission. Wants to go home.   Objective: Vital signs in last 24 hours: Temp:  [97.4 F (36.3 C)-98.4 F (36.9 C)] 98.1 F (36.7 C) (01/03 0752) Pulse Rate:  [60-170] 61 (01/03 0753) Resp:  [15-29] 21 (01/03 0753) BP: (94-162)/(56-146) 94/56 (01/02 1800) SpO2:  [77 %-100 %] 100 % (01/03 0753) Weight:  [120 lb 9.5 oz (54.7 kg)] 120 lb 9.5 oz (54.7 kg) (01/03 0455) Last BM Date: 01/23/16 General:   Alert,  Well-developed, well-nourished, pleasant and cooperative in NAD Head:  Normocephalic and atraumatic. Eyes:  Sclera clear, no icterus.  Abdomen:  Soft, nontender and nondistended. Normal bowel sounds, without guarding, and without rebound.   Extremities:  Without clubbing, deformity or edema. Neurologic:  Alert and  oriented x4;  grossly normal neurologically. Skin:  Intact without significant lesions or rashes. Psych:  Alert and cooperative. Normal mood and affect.  Intake/Output from previous day: 01/02 0701 - 01/03 0700 In: 480 [P.O.:480] Out: -  Intake/Output this shift: No intake/output data recorded.  Lab Results: CBC  Recent Labs  01/22/16 0511 01/24/16 0350  WBC 6.3 5.0  HGB 9.2* 10.5*  HCT 28.6* 31.2*  MCV 96.0 95.4  PLT 168 186   BMET No results for input(s): NA, K, CL, CO2, GLUCOSE, BUN, CREATININE, CALCIUM in the last 72 hours. LFTs No results for input(s): BILITOT, BILIDIR, IBILI, ALKPHOS, AST, ALT, PROT, ALBUMIN in the last 72 hours. No results for input(s): LIPASE in the last 72 hours. PT/INR  Recent Labs  01/22/16 0511 01/23/16 0429 01/24/16 0350  LABPROT 12.7 12.9 13.9  INR 0.96 0.97 1.07      Imaging Studies: Dg Chest Portable 1 View  Result Date: 01/20/2016 CLINICAL DATA:  Generalized weakness.  Nausea .  Lack of appetite. EXAM: PORTABLE CHEST 1 VIEW COMPARISON:  10/31/2015 FINDINGS: Heart size is normal. Mediastinal shadows are normal. Pacemaker/AICD remains in place. The vascularity is normal.  The lungs are clear. No effusions. IMPRESSION: No active disease. Electronically Signed   By: Nelson Chimes M.D.   On: 01/20/2016 19:13  [2 weeks]   Assessment:  61 year old female admitted with profound anemia, melena, supratherapeutic INR (>10) in the setting of Coumadin and keflex. Actually noted to have drop in Hgb by hematology on 12/11 down to 7.8 from 10.4 in 10/2015. Had plans for outpatient transfusion and IV iron but patient no showed her appointment. Has received 2 units of prbcs this admission.   EGD 01/22/16 with widely patent Schatzki's ring, gastric erythema and edema, examined duodenum normal. Capsule with no old or fresh blood in small bowel or colon with ?few ulcers in proximal small bowel. Overall exam limited due to debris.   Plan: 1. Colonoscopy if patient has recurrent overt GI bleeding or transfusion dependent anemia. OK for discharge from GI standpoint.   2. Coumadin resumed yesterday.   3. Avoid asa and nsaids.  4. Monitor Hgb closely as outpatient, best done by hematology who actively sees her.  5. Patient has scheduled follow up with our office next month.  6. Will sign off, call with questions.  Laureen Ochs. Bernarda Caffey [facility] [phone] 1/3/20189:21 AM     LOS: 4 days

## 2016-01-24 NOTE — Progress Notes (Signed)
[place] for Coumadin (PTA med) Indication: atrial fibrillation  Allergies  Allergen Reactions  . Morphine And Related Other (See Comments)    Dizziness and hallucinations  . Penicillins Anaphylaxis and Rash    Has patient had a PCN reaction causing immediate rash, facial/tongue/throat swelling, SOB or lightheadedness with hypotension:unknown Has patient had a PCN reaction causing severe rash involving mucus membranes or skin necrosis: unknown Has patient had a PCN reaction that required hospitalization unknown Has patient had a PCN reaction occurring within the last 10 years: unknown If all of the above answers are "NO", then may proceed with Cephalosporin use.    Patient Measurements: Height: 5\' 3"  (160 cm) Weight: 120 lb 9.5 oz (54.7 kg) IBW/kg (Calculated) : 52.4  Vital Signs: Temp: 98.1 F (36.7 C) (01/03 0752) Temp Source: Oral (01/03 0752) Pulse Rate: 61 (01/03 0753)  Labs:  Recent Labs  01/22/16 0511 01/23/16 0429 01/24/16 0350  HGB 9.2*  --  10.5*  HCT 28.6*  --  31.2*  PLT 168  --  186  LABPROT 12.7 12.9 13.9  INR 0.96 0.97 1.07    Estimated Creatinine Clearance: 28.8 mL/min (by C-G formula based on SCr of 1.72 mg/dL (H)).  Medical History: Past Medical History:  Diagnosis Date  . Anemia   . Anxiety 2015   . CAD (coronary artery disease)    a. LHC (04/2013): Chronically occluded LAD, moderate dz in large OM1 and severe dz and small posterior lateral vessel    . chronic systolic heart failure    a. ECHO (05/08/13): EF 20-25%, diff HK with akinesis of basal inferior wall, grade 1 DD, trivial MR, trivial TR b) RHC (05/06/13): RA 7, RV 30/2, PA 31/19 (24), PCWP 10, Fick CO/CI: 2.9/1.74, Thermo CO/Ci: 2.4/1.4, PA sat 53%  . Depression   . Diverticulosis   . DNR (do not resuscitate) 06/05/2015   Confirmed with patient on 06/05/2015  . ETOH abuse 1981   started abusing alcohol at age 30, quit   . GERD (gastroesophageal  reflux disease) 2015   . History of blood transfusion    "related to losing blood from somewhere; never found out from where"  . Hyperlipidemia 2015  . Hypertension 2015  . Hypothyroidism   . Ischemic cardiomyopathy   . Mass of right breast on mammogram 07/14/2014  . Pancreatitis 1980, 2015   in the setting of ETOH  . Persistent atrial fibrillation ([place]) 2015  . Psoriasis 1968  . Stroke [facility]) 2011   left side weakness, minimal  . Suicidal overdose ([place]) 10/03/13   Overdoes on Beta Blockers & Xarelto ([place])   Medications:  Prescriptions Prior to Admission  Medication Sig Dispense Refill Last Dose  . acetaminophen (TYLENOL) 325 MG tablet Take 650 mg by mouth every 6 (six) hours as needed for mild pain.   Taking  . ammonium lactate (AMLACTIN) 12 % cream Apply 1 g topically daily as needed for dry skin (apply to elbows).    Taking  . atorvastatin (LIPITOR) 80 MG tablet TAKE 1 TABLET BY MOUTH ONCE DAILY. 30 tablet 3 01/20/2016 at Unknown time  . calcium carbonate (OS-CAL - DOSED IN MG OF ELEMENTAL CALCIUM) 1250 (500 Ca) MG tablet Take 1 tablet (500 mg of elemental calcium total) by mouth 3 (three) times daily with meals. 90 tablet 3 01/20/2016 at Unknown time  . carvedilol (COREG) 12.5 MG tablet TAKE 1 TABLET BY MOUTH TWICE DAILY. 60 tablet 3 01/20/2016 at 800  .  clonazePAM (KLONOPIN) 0.5 MG tablet Take 0.5 mg by mouth 2 (two) times daily.   Past Week at Unknown time  . ezetimibe (ZETIA) 10 MG tablet TAKE 1 TABLET BY MOUTH ONCE DAILY. 30 tablet 3 01/20/2016 at Unknown time  . levothyroxine (SYNTHROID, LEVOTHROID) 25 MCG tablet TAKE 1/2 TABLET BY MOUTH BEFORE BREAKFAST. 15 tablet 3 01/20/2016 at Unknown time  . lisinopril (PRINIVIL,ZESTRIL) 2.5 MG tablet Take 1 tablet (2.5 mg total) by mouth daily. D/C order for Hydralazine 30 tablet 6 01/20/2016 at Unknown time  . nitroGLYCERIN (NITROSTAT) 0.3 MG SL tablet Place 0.3 mg under the tongue every 5 (five) minutes as needed for chest pain (MAX 3  TABLETS). Reported on 07/18/2015   Taking  . ondansetron (ZOFRAN) 4 MG tablet Take 4 mg by mouth every 8 (eight) hours as needed for nausea or vomiting. Reported on 07/18/2015   Taking  . pantoprazole (PROTONIX) 40 MG tablet TAKE 1 TABLET BY MOUTH TWICE DAILY BEFORE A MEAL. 60 tablet 3 01/20/2016 at Unknown time  . potassium chloride SA (K-DUR,KLOR-CON) 20 MEQ tablet TAKE 1 TABLET BY MOUTH ONCE DAILY. 30 tablet 3 01/20/2016 at Unknown time  . sertraline (ZOLOFT) 50 MG tablet TAKE 2 TABLETS (=TO A 100MG  DOSE) BY MOUTH ONCE DAILY. 60 tablet 3 01/20/2016 at Unknown time  . torsemide (DEMADEX) 20 MG tablet Take 1 tablet (20 mg total) by mouth every other day. 15 tablet 2 01/20/2016 at Unknown time  . warfarin (COUMADIN) 2.5 MG tablet Take 2.5 mg by mouth daily. Take 1 and 1/2 daily on Tuesday, Thursday , Saturday and Sunday only   01/20/2016 at 1700time  . warfarin (COUMADIN) 5 MG tablet Take 5 mg by mouth daily. Take on Monday Wednesday and friday   01/19/2016 at 1700time  . warfarin (COUMADIN) 7.5 MG tablet Take 3.75 mg by mouth daily.   01/19/2016 at 1700   Assessment: 61yo female on chronic Coumadin PTA.  INR was SUPRAtherapeutic ( > 10) on admission and was reversed with KCentra and Vitamin K on 12/30.  GI w/u completed and no bleeding found.  Asked to resume Warfarin.  INR response initially will likely be sluggish given administration of reversal agents. INR 1.07.  Goal of Therapy:  INR 2-3 Monitor platelets by anticoagulation protocol: Yes   Plan:  Coumadin 5mg  po today x 1 PT/INR daily Monitor for S/S of bleeding  Isac Sarna, BS Vena [place], BCPS Clinical Pharmacist Pager [phone] 01/24/2016,9:42 AM

## 2016-01-24 NOTE — Progress Notes (Signed)
Subjective: Patient is resting. She is doing better. She is restarted on coumadin and PT/INR is being monitored by pharmacy. Her capsule study report pending..  Objective: Vital signs in last 24 hours: Temp:  [97.4 F (36.3 C)-98.4 F (36.9 C)] 98.1 F (36.7 C) (01/03 0752) Pulse Rate:  [60-170] 61 (01/03 0753) Resp:  [15-29] 21 (01/03 0753) BP: (94-162)/(56-146) 94/56 (01/02 1800) SpO2:  [77 %-100 %] 100 % (01/03 0753) Weight:  [54.7 kg (120 lb 9.5 oz)] 54.7 kg (120 lb 9.5 oz) (01/03 0455) Weight change: -0.1 kg (-3.5 oz) Last BM Date: 01/23/16  Intake/Output from previous day: 01/02 0701 - 01/03 0700 In: 480 [P.O.:480] Out: -   PHYSICAL EXAM General appearance: alert and no distress Resp: clear to auscultation bilaterally Cardio: S1, S2 normal GI: soft, non-tender; bowel sounds normal; no masses,  no organomegaly Extremities: extremities normal, atraumatic, no cyanosis or edema  Lab Results:  Results for orders placed or performed during the hospital encounter of 01/20/16 (from the past 48 hour(s))  Protime-INR     Status: None   Collection Time: 01/23/16  4:29 AM  Result Value Ref Range   Prothrombin Time 12.9 11.4 - 15.2 seconds   INR 0.97   Protime-INR     Status: None   Collection Time: 01/24/16  3:50 AM  Result Value Ref Range   Prothrombin Time 13.9 11.4 - 15.2 seconds   INR 1.07   CBC     Status: Abnormal   Collection Time: 01/24/16  3:50 AM  Result Value Ref Range   WBC 5.0 4.0 - 10.5 K/uL   RBC 3.27 (L) 3.87 - 5.11 MIL/uL   Hemoglobin 10.5 (L) 12.0 - 15.0 g/dL   HCT 31.2 (L) 36.0 - 46.0 %   MCV 95.4 78.0 - 100.0 fL   MCH 32.1 26.0 - 34.0 pg   MCHC 33.7 30.0 - 36.0 g/dL   RDW 13.5 11.5 - 15.5 %   Platelets 186 150 - 400 K/uL    ABGS No results for input(s): PHART, PO2ART, TCO2, HCO3 in the last 72 hours.  Invalid input(s): PCO2 CULTURES Recent Results (from the past 240 hour(s))  MRSA PCR Screening     Status: None   Collection Time: 01/20/16   9:52 PM  Result Value Ref Range Status   MRSA by PCR NEGATIVE NEGATIVE Final    Comment:        The GeneXpert MRSA Assay (FDA approved for NASAL specimens only), is one component of a comprehensive MRSA colonization surveillance program. It is not intended to diagnose MRSA infection nor to guide or monitor treatment for MRSA infections.    Studies/Results: No results found.  Medications: I have reviewed the patient's current medications.  Assesment:  Principal Problem:   Symptomatic anemia Active Problems:   Hypertension   GERD (gastroesophageal reflux disease)   Cardiomyopathy, ischemic- EF 25-30% 2D 09/02/13   Chronic kidney disease, stage 4 (severe) (HCC)   Hypothyroidism   Paroxysmal a-fib (HCC)   Elevated INR   Heme positive stool    Plan:  Medications reviewed Will continue current treatment according GI recommendation.  Continue anticoagulation and dose to be adjusted by pharmacy. Will monitor CBC.    LOS: 4 days   Candler Ginsberg 01/24/2016, 8:17 AM

## 2016-01-24 NOTE — Progress Notes (Addendum)
Patient transferred to dept 300 room# 318.  A&O, in stable condition. Report given to Janett Billow, RN on dept 300.

## 2016-01-25 LAB — BASIC METABOLIC PANEL
Anion gap: 7 (ref 5–15)
BUN: 27 mg/dL — AB (ref 6–20)
CALCIUM: 8.8 mg/dL — AB (ref 8.9–10.3)
CHLORIDE: 104 mmol/L (ref 101–111)
CO2: 27 mmol/L (ref 22–32)
CREATININE: 1.92 mg/dL — AB (ref 0.44–1.00)
GFR calc Af Amer: 32 mL/min — ABNORMAL LOW (ref >60)
GFR calc non Af Amer: 27 mL/min — ABNORMAL LOW (ref >60)
Glucose, Bld: 93 mg/dL (ref 65–99)
Potassium: 4 mmol/L (ref 3.5–5.1)
SODIUM: 138 mmol/L (ref 135–145)

## 2016-01-25 LAB — CBC
HCT: 32.8 % — ABNORMAL LOW (ref 36.0–46.0)
Hemoglobin: 11.3 g/dL — ABNORMAL LOW (ref 12.0–15.0)
MCH: 32.8 pg (ref 26.0–34.0)
MCHC: 34.5 g/dL (ref 30.0–36.0)
MCV: 95.3 fL (ref 78.0–100.0)
PLATELETS: 174 10*3/uL (ref 150–400)
RBC: 3.44 MIL/uL — ABNORMAL LOW (ref 3.87–5.11)
RDW: 13.2 % (ref 11.5–15.5)
WBC: 4.5 10*3/uL (ref 4.0–10.5)

## 2016-01-25 LAB — PROTIME-INR
INR: 1.14
PROTHROMBIN TIME: 14.7 s (ref 11.4–15.2)

## 2016-01-25 MED ORDER — WARFARIN SODIUM 5 MG PO TABS: 5.0000 mg | ORAL_TABLET | Freq: Every day | ORAL | 0 refills | Status: DC

## 2016-01-25 NOTE — Clinical Social Work Note (Signed)
CSW notified Webb Silversmith at [facility] that patient was discharging.   CSW spoke with Administrator Pervis Hocking and arranged transportation for patient.   CSW left a message for patient's sister advising for patient's discharge.  CSW signing off.   Socorro Ebron, Clydene Pugh, LCSW

## 2016-01-25 NOTE — NC FL2 (Signed)
[place] LEVEL OF CARE SCREENING TOOL     IDENTIFICATION  Patient Name: [name] Birthdate: Nov 15, 1955 Sex: female Admission Date (Current Location): 01/20/2016  [facility] and [place] Number:  [place] [id number] [employer] and Address:         Provider Number: [id number]  Attending Physician Name and Address:  Rosita Fire, MD  Relative Name and Phone Number:       Current Level of Care: Hospital Recommended Level of Care: [facility] Prior Approval Number:    Date Approved/Denied:   PASRR Number: [id number]  Discharge Plan: Home    Current Diagnoses: Patient Active Problem List   Diagnosis Date Noted  . Symptomatic anemia 01/20/2016  . Elevated INR 01/20/2016  . Heme positive stool 01/20/2016  . Abdominal pain, epigastric 11/30/2015  . Chronic systolic dysfunction of left ventricle 10/30/2015  . DNR (do not resuscitate) 06/05/2015  . Chronic systolic heart failure ([place]) 05/17/2015  . Paroxysmal a-fib ([place]) 03/30/2015  . CKD (chronic kidney disease) stage 5, GFR less than 15 ml/min (HCC) 03/08/2015  . Arm edema   . Low oxygen saturation   . Palliative care encounter   . NSTEMI (non-ST elevated myocardial infarction) ([place]) 10/16/2014  . S/P ICD (internal cardiac defibrillator) procedure, St. 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 Ischemic cardiomyopathy 09/13/2014  . Mass of right breast on mammogram 07/14/2014  . CHF (congestive heart failure) ([place]) 04/09/2014  . CHF exacerbation ([place])   . Iron deficiency anemia 03/01/2014  . Gastritis and gastroduodenitis 02/28/2014  . Cervical cancer screening 12/30/2013  . Hypothyroidism 12/30/2013  . FH: colon cancer 11/18/2013  . Psoriasis 10/22/2013  . Chronic kidney disease, stage 4 (severe) ([place]) 10/22/2013  . Suicide attempt Sept 2015 10/03/2013  . Anxiety state 09/24/2013  . Cardiomyopathy, ischemic- EF 25-30% 2D 09/02/13 09/03/2013  . CAD- total LAD- residual OM2 disease 09/03/2013  . H/O  PAF 06/06/2013  . Tobacco abuse 05/09/2013  . Family history of premature CAD 05/09/2013  . H/O alcohol abuse 05/06/2013  . Hypertension   . GERD (gastroesophageal reflux disease)     Orientation RESPIRATION BLADDER Height & Weight     Self, Time, Situation, Place  Normal Continent Weight: 120 lb 9.5 oz (54.7 kg) Height:  5\' 3"  (160 cm)  BEHAVIORAL SYMPTOMS/MOOD NEUROLOGICAL BOWEL NUTRITION STATUS      Continent Diet (Heart Healthy)  AMBULATORY STATUS COMMUNICATION OF NEEDS Skin   Independent Verbally Normal                       Personal Care Assistance Level of Assistance  Bathing, Feeding, Dressing Bathing Assistance: Independent Feeding assistance: Independent Dressing Assistance: Independent     Functional Limitations Info  Sight, Hearing, Speech Sight Info: Adequate Hearing Info: Adequate Speech Info: Adequate    SPECIAL CARE FACTORS FREQUENCY                       Contractures      Additional Factors Info  Code Status, Allergies Code Status Info: DNR Allergies Info: Morphine and related, Penicillins           Current Medications (01/25/2016):  This is the current hospital active medication list Current Facility-Administered Medications  Medication Dose Route Frequency Provider Last Rate Last Dose  . acetaminophen (TYLENOL) tablet 650 mg  650 mg Oral Q6H PRN Tanna Savoy Stinson, DO   650 mg at 01/24/16 1948   Or  . acetaminophen (TYLENOL) suppository 650 mg  650 mg Rectal Q6H PRN Tanna Savoy Stinson, DO      . acetaminophen (TYLENOL) tablet 650 mg  650 mg Oral Once [employer], PA-C      . ammonium lactate (LAC-HYDRIN) 12 % lotion 1 application  1 application Topical Daily PRN Tanna Savoy Stinson, DO      . atorvastatin (LIPITOR) tablet 80 mg  80 mg Oral Daily Tanna Savoy Stinson, DO   80 mg at 01/25/16 0843  . clonazePAM (KLONOPIN) tablet 0.5 mg  0.5 mg Oral BID Tanna Savoy Stinson, DO   0.5 mg at 01/25/16 [id number]  . diphenhydrAMINE (BENADRYL) capsule 25 mg  25  mg Oral Once Thomas S Kefalas, PA-C      . ezetimibe (ZETIA) tablet 10 mg  10 mg Oral Daily Tanna Savoy Stinson, DO   10 mg at 01/25/16 0843  . heparin lock flush 100 unit/mL  500 Units Intracatheter Daily PRN Baird Cancer, PA-C      . heparin lock flush 100 unit/mL  250 Units Intracatheter PRN Baird Cancer, PA-C      . levothyroxine (SYNTHROID, LEVOTHROID) tablet 12.5 mcg  12.5 mcg Oral QAC breakfast Tanna Savoy Stinson, DO   12.5 mcg at 01/25/16 0844  . ondansetron (ZOFRAN) tablet 4 mg  4 mg Oral Q6H PRN Tanna Savoy Stinson, DO       Or  . ondansetron [facility]) injection 4 mg  4 mg Intravenous Q6H PRN Tanna Savoy Stinson, DO      . ondansetron [facility]) tablet 4 mg  4 mg Oral Q8H PRN Tanna Savoy Stinson, DO      . pantoprazole (PROTONIX) EC tablet 40 mg  40 mg Oral QAC breakfast Danie Binder, MD   40 mg at 01/25/16 0843  . potassium chloride SA (K-DUR,KLOR-CON) CR tablet 20 mEq  20 mEq Oral Daily Tanna Savoy Stinson, DO   20 mEq at 01/25/16 0843  . sertraline (ZOLOFT) tablet 100 mg  100 mg Oral QHS Tanna Savoy Stinson, DO   100 mg at 01/24/16 2154  . sodium chloride flush (NS) 0.9 % injection 10 mL  10 mL Intracatheter PRN Baird Cancer, PA-C      . sodium chloride flush (NS) 0.9 % injection 3 mL  3 mL Intracatheter PRN Baird Cancer, PA-C      . torsemide (DEMADEX) tablet 20 mg  20 mg Oral QODAY Tanna Savoy Stinson, DO   20 mg at 01/24/16 0936  . Warfarin - Pharmacist Dosing Inpatient   Does not apply Q24H Rosita Fire, MD         Discharge Medications: Medication List    TAKE these medications   acetaminophen 325 MG tablet Commonly known as:  TYLENOL Take 650 mg by mouth every 6 (six) hours as needed for mild pain.  ammonium lactate 12 % cream Commonly known as:  AMLACTIN Apply 1 g topically daily as needed for dry skin (apply to elbows).  atorvastatin 80 MG tablet Commonly known as:  LIPITOR TAKE 1 TABLET BY MOUTH ONCE DAILY.  calcium carbonate 1250 (500 Ca) MG tablet Commonly known as:  OS-CAL  - dosed in mg of elemental calcium Take 1 tablet (500 mg of elemental calcium total) by mouth 3 (three) times daily with meals.  carvedilol 12.5 MG tablet Commonly known as:  COREG TAKE 1 TABLET BY MOUTH TWICE DAILY.  clonazePAM 0.5 MG tablet Commonly known as:  KLONOPIN Take 0.5 mg by mouth 2 (two) times daily.  ezetimibe  10 MG tablet Commonly known as:  ZETIA TAKE 1 TABLET BY MOUTH ONCE DAILY.  levothyroxine 25 MCG tablet Commonly known as:  SYNTHROID, LEVOTHROID TAKE 1/2 TABLET BY MOUTH BEFORE BREAKFAST.  lisinopril 2.5 MG tablet Commonly known as:  PRINIVIL,ZESTRIL Take 1 tablet (2.5 mg total) by mouth daily. D/C order for Hydralazine  nitroGLYCERIN 0.3 MG SL tablet Commonly known as:  NITROSTAT Place 0.3 mg under the tongue every 5 (five) minutes as needed for chest pain (MAX 3 TABLETS). Reported on 07/18/2015  ondansetron 4 MG tablet Commonly known as:  ZOFRAN Take 4 mg by mouth every 8 (eight) hours as needed for nausea or vomiting. Reported on 07/18/2015  pantoprazole 40 MG tablet Commonly known as:  PROTONIX TAKE 1 TABLET BY MOUTH TWICE DAILY BEFORE A MEAL.  potassium chloride SA 20 MEQ tablet Commonly known as:  K-DUR,KLOR-CON TAKE 1 TABLET BY MOUTH ONCE DAILY.  sertraline 50 MG tablet Commonly known as:  ZOLOFT TAKE 2 TABLETS (=TO A 100MG  DOSE) BY MOUTH ONCE DAILY.  torsemide 20 MG tablet Commonly known as:  DEMADEX Take 1 tablet (20 mg total) by mouth every other day.  warfarin 5 MG tablet Commonly known as:  COUMADIN Take 1 tablet (5 mg total) by mouth daily. What changed:  medication strength  how much to take  Another medication with the same name was removed. Continue taking this medication, and follow the directions you see here.     Relevant Imaging Results:  Relevant Lab Results:   Additional Information SSN [id number]  Ihor Gully, LCSW

## 2016-01-25 NOTE — Progress Notes (Signed)
Subjective: Patient is resting. She is doing better. Gi has cleared for discharge. She is restarted on coumadin but not yet therapeutic Objective: Vital signs in last 24 hours: Temp:  [98.5 F (36.9 C)-98.8 F (37.1 C)] 98.8 F (37.1 C) (01/04 0502) Pulse Rate:  [60-87] 86 (01/04 0502) Resp:  [16-20] 20 (01/04 0502) BP: (97-112)/(52-63) 112/63 (01/04 0502) SpO2:  [97 %-100 %] 100 % (01/04 0502) Weight change:  Last BM Date: 01/23/16  Intake/Output from previous day: No intake/output data recorded.  PHYSICAL EXAM General appearance: alert and no distress Resp: clear to auscultation bilaterally Cardio: S1, S2 normal GI: soft, non-tender; bowel sounds normal; no masses,  no organomegaly Extremities: extremities normal, atraumatic, no cyanosis or edema  Lab Results:  Results for orders placed or performed during the hospital encounter of 01/20/16 (from the past 48 hour(s))  Protime-INR     Status: None   Collection Time: 01/24/16  3:50 AM  Result Value Ref Range   Prothrombin Time 13.9 11.4 - 15.2 seconds   INR 1.07   CBC     Status: Abnormal   Collection Time: 01/24/16  3:50 AM  Result Value Ref Range   WBC 5.0 4.0 - 10.5 K/uL   RBC 3.27 (L) 3.87 - 5.11 MIL/uL   Hemoglobin 10.5 (L) 12.0 - 15.0 g/dL   HCT 31.2 (L) 36.0 - 46.0 %   MCV 95.4 78.0 - 100.0 fL   MCH 32.1 26.0 - 34.0 pg   MCHC 33.7 30.0 - 36.0 g/dL   RDW 13.5 11.5 - 15.5 %   Platelets 186 150 - 400 K/uL  Protime-INR     Status: None   Collection Time: 01/25/16  4:47 AM  Result Value Ref Range   Prothrombin Time 14.7 11.4 - 15.2 seconds   INR 5.46   Basic metabolic panel     Status: Abnormal   Collection Time: 01/25/16  4:47 AM  Result Value Ref Range   Sodium 138 135 - 145 mmol/L   Potassium 4.0 3.5 - 5.1 mmol/L   Chloride 104 101 - 111 mmol/L   CO2 27 22 - 32 mmol/L   Glucose, Bld 93 65 - 99 mg/dL   BUN 27 (H) 6 - 20 mg/dL   Creatinine, Ser 1.92 (H) 0.44 - 1.00 mg/dL   Calcium 8.8 (L) 8.9 - 10.3  mg/dL   GFR calc non Af Amer 27 (L) >60 mL/min   GFR calc Af Amer 32 (L) >60 mL/min    Comment: (NOTE) The eGFR has been calculated using the CKD EPI equation. This calculation has not been validated in all clinical situations. eGFR's persistently <60 mL/min signify possible Chronic Kidney Disease.    Anion gap 7 5 - 15  CBC     Status: Abnormal   Collection Time: 01/25/16  4:47 AM  Result Value Ref Range   WBC 4.5 4.0 - 10.5 K/uL   RBC 3.44 (L) 3.87 - 5.11 MIL/uL   Hemoglobin 11.3 (L) 12.0 - 15.0 g/dL   HCT 32.8 (L) 36.0 - 46.0 %   MCV 95.3 78.0 - 100.0 fL   MCH 32.8 26.0 - 34.0 pg   MCHC 34.5 30.0 - 36.0 g/dL   RDW 13.2 11.5 - 15.5 %   Platelets 174 150 - 400 K/uL    ABGS No results for input(s): PHART, PO2ART, TCO2, HCO3 in the last 72 hours.  Invalid input(s): PCO2 CULTURES Recent Results (from the past 240 hour(s))  MRSA PCR Screening  Status: None   Collection Time: 01/20/16  9:52 PM  Result Value Ref Range Status   MRSA by PCR NEGATIVE NEGATIVE Final    Comment:        The GeneXpert MRSA Assay (FDA approved for NASAL specimens only), is one component of a comprehensive MRSA colonization surveillance program. It is not intended to diagnose MRSA infection nor to guide or monitor treatment for MRSA infections.    Studies/Results: No results found.  Medications: I have reviewed the patient's current medications.  Assesment:  Principal Problem:   Symptomatic anemia Active Problems:   Hypertension   GERD (gastroesophageal reflux disease)   Cardiomyopathy, ischemic- EF 25-30% 2D 09/02/13   Chronic kidney disease, stage 4 (severe) (HCC)   Hypothyroidism   Paroxysmal a-fib (HCC)   Elevated INR   Heme positive stool    Plan:  Medications reviewed Will discharge home today Will refer to coumadin clinic to monitor PPT/INR  LOS: 5 days   Princella Jaskiewicz 01/25/2016, 8:14 AM

## 2016-01-25 NOTE — Care Management Note (Signed)
Case Management Note  Patient Details  Name: [name] MRN: [id number] Date of Birth: Apr 15, 1955  Subjective/Objective:                  Pt is from [place]. Pt admitted with symptomatic anemia. Pt discharging back to group home today. CSW aware and making arrangements for return to facility. Appointment made at coumadin clinic for Monday.   Action/Plan: No further CM needs.   Expected Discharge Date:  01/22/16               Expected Discharge Plan:  Group Home  In-House Referral:  Clinical Social Work  Discharge planning Services  CM Consult  Post Acute Care Choice:  NA Choice offered to:  NA  Status of Service:  Completed, signed off [name], RN 01/25/2016, 8:49 AM

## 2016-01-25 NOTE — Discharge Summary (Signed)
Physician Discharge Summary  Patient ID: [name] MRN: [id number] DOB/AGE: 1955-05-11 61 y.o. Primary Care Physician:Trissa Molina, MD Admit date: 01/20/2016 Discharge date: 01/25/2016    Discharge Diagnoses:   Principal Problem:   Symptomatic anemia Active Problems:   Hypertension   GERD (gastroesophageal reflux disease)   Cardiomyopathy, ischemic- EF 25-30% 2D 09/02/13   Chronic kidney disease, stage 4 (severe) (HCC)   Hypothyroidism   Paroxysmal a-fib (HCC)   Elevated INR   Heme positive stool   Allergies as of 01/25/2016      Reactions   Morphine And Related Other (See Comments)   Dizziness and hallucinations   Penicillins Anaphylaxis, Rash   Has patient had a PCN reaction causing immediate rash, facial/tongue/throat swelling, SOB or lightheadedness with hypotension:unknown Has patient had a PCN reaction causing severe rash involving mucus membranes or skin necrosis: unknown Has patient had a PCN reaction that required hospitalization unknown Has patient had a PCN reaction occurring within the last 10 years: unknown If all of the above answers are "NO", then may proceed with Cephalosporin use.      Medication List    TAKE these medications   acetaminophen 325 MG tablet Commonly known as:  TYLENOL Take 650 mg by mouth every 6 (six) hours as needed for mild pain.   ammonium lactate 12 % cream Commonly known as:  AMLACTIN Apply 1 g topically daily as needed for dry skin (apply to elbows).   atorvastatin 80 MG tablet Commonly known as:  LIPITOR TAKE 1 TABLET BY MOUTH ONCE DAILY.   calcium carbonate 1250 (500 Ca) MG tablet Commonly known as:  OS-CAL - dosed in mg of elemental calcium Take 1 tablet (500 mg of elemental calcium total) by mouth 3 (three) times daily with meals.   carvedilol 12.5 MG tablet Commonly known as:  COREG TAKE 1 TABLET BY MOUTH TWICE DAILY.   clonazePAM 0.5 MG tablet Commonly known as:  KLONOPIN Take 0.5 mg by mouth 2 (two) times  daily.   ezetimibe 10 MG tablet Commonly known as:  ZETIA TAKE 1 TABLET BY MOUTH ONCE DAILY.   levothyroxine 25 MCG tablet Commonly known as:  SYNTHROID, LEVOTHROID TAKE 1/2 TABLET BY MOUTH BEFORE BREAKFAST.   lisinopril 2.5 MG tablet Commonly known as:  PRINIVIL,ZESTRIL Take 1 tablet (2.5 mg total) by mouth daily. D/C order for Hydralazine   nitroGLYCERIN 0.3 MG SL tablet Commonly known as:  NITROSTAT Place 0.3 mg under the tongue every 5 (five) minutes as needed for chest pain (MAX 3 TABLETS). Reported on 07/18/2015   ondansetron 4 MG tablet Commonly known as:  ZOFRAN Take 4 mg by mouth every 8 (eight) hours as needed for nausea or vomiting. Reported on 07/18/2015   pantoprazole 40 MG tablet Commonly known as:  PROTONIX TAKE 1 TABLET BY MOUTH TWICE DAILY BEFORE A MEAL.   potassium chloride SA 20 MEQ tablet Commonly known as:  K-DUR,KLOR-CON TAKE 1 TABLET BY MOUTH ONCE DAILY.   sertraline 50 MG tablet Commonly known as:  ZOLOFT TAKE 2 TABLETS (=TO A 100MG  DOSE) BY MOUTH ONCE DAILY.   torsemide 20 MG tablet Commonly known as:  DEMADEX Take 1 tablet (20 mg total) by mouth every other day.   warfarin 5 MG tablet Commonly known as:  COUMADIN Take 1 tablet (5 mg total) by mouth daily. What changed:  medication strength  how much to take  Another medication with the same name was removed. Continue taking this medication, and follow the directions you see here.  Discharged Condition: improved    Consults: GI  Significant Diagnostic Studies: Dg Chest Portable 1 View  Result Date: 01/20/2016 CLINICAL DATA:  Generalized weakness.  Nausea .  Lack of appetite. EXAM: PORTABLE CHEST 1 VIEW COMPARISON:  10/31/2015 FINDINGS: Heart size is normal. Mediastinal shadows are normal. Pacemaker/AICD remains in place. The vascularity is normal. The lungs are clear. No effusions. IMPRESSION: No active disease. Electronically Signed   By: Nelson Chimes M.D.   On: 01/20/2016  19:13    Lab Results: Basic Metabolic Panel:  Recent Labs  01/25/16 0447  NA 138  K 4.0  CL 104  CO2 27  GLUCOSE 93  BUN 27*  CREATININE 1.92*  CALCIUM 8.8*   Liver Function Tests: No results for input(s): AST, ALT, ALKPHOS, BILITOT, PROT, ALBUMIN in the last 72 hours.   CBC:  Recent Labs  01/24/16 0350 01/25/16 0447  WBC 5.0 4.5  HGB 10.5* 11.3*  HCT 31.2* 32.8*  MCV 95.4 95.3  PLT 186 174    Recent Results (from the past 240 hour(s))  MRSA PCR Screening     Status: None   Collection Time: 01/20/16  9:52 PM  Result Value Ref Range Status   MRSA by PCR NEGATIVE NEGATIVE Final    Comment:        The GeneXpert MRSA Assay (FDA approved for NASAL specimens only), is one component of a comprehensive MRSA colonization surveillance program. It is not intended to diagnose MRSA infection nor to guide or monitor treatment for MRSA infections.      Hospital Course:   This is a 61 years old female with history of multiple  medical illnesses was admitted due to severe anemia and GI bleeding. Patient had supra therapeutic INR. Patient was transfused and vit k was given. She was evaluated by GI and EGD and capsule study was done. EGD showed Schatzki's ring and gastric erythema. Patient improved. She is restarted on coumadin and discharge to be followed in the office and her PT/INR will be monitored in coumadin clinic  Discharge Exam: Blood pressure 112/63, pulse 86, temperature 98.8 F (37.1 C), temperature source Oral, resp. rate 20, height 5\' 3"  (1.6 m), weight 54.7 kg (120 lb 9.5 oz), last menstrual period 01/22/1988, SpO2 100 %.    Disposition:  Assisted living    Follow-up [place], MD Follow up in 1 week(s).   Specialty:  Internal Medicine Contact information: [address] [place] [address] [phone]        [facility] Follow up in 5 day(s).           Signed: Ryian Lynde   01/25/2016, 8:24  AM

## 2016-01-25 NOTE — Progress Notes (Signed)
Pt discharged home today per Dr. Fanta.  Pt's IV site D/C'd and WDL.  Pt's VSS.  Pt provided with home medication list, discharge instructions and prescriptions.  Verbalized understanding.  Pt left floor via WC in stable condition accompanied by NT. 

## 2016-01-26 ENCOUNTER — Other Ambulatory Visit: Payer: Self-pay

## 2016-01-26 ENCOUNTER — Encounter (HOSPITAL_COMMUNITY): Payer: Self-pay

## 2016-01-26 ENCOUNTER — Emergency Department (HOSPITAL_COMMUNITY)
Admission: EM | Admit: 2016-01-26 | Discharge: 2016-01-27 | Disposition: A | Discharge status: Home / Self Care | Payer: Medicaid Other | Attending: Emergency Medicine | Admitting: Emergency Medicine

## 2016-01-26 DIAGNOSIS — R197 Diarrhea, unspecified: Secondary | ICD-10-CM | POA: Diagnosis not present

## 2016-01-26 DIAGNOSIS — F17210 Nicotine dependence, cigarettes, uncomplicated: Secondary | ICD-10-CM | POA: Insufficient documentation

## 2016-01-26 DIAGNOSIS — E039 Hypothyroidism, unspecified: Secondary | ICD-10-CM | POA: Diagnosis not present

## 2016-01-26 DIAGNOSIS — N184 Chronic kidney disease, stage 4 (severe): Secondary | ICD-10-CM | POA: Diagnosis not present

## 2016-01-26 DIAGNOSIS — Z79899 Other long term (current) drug therapy: Secondary | ICD-10-CM | POA: Insufficient documentation

## 2016-01-26 DIAGNOSIS — I5022 Chronic systolic (congestive) heart failure: Secondary | ICD-10-CM | POA: Diagnosis not present

## 2016-01-26 DIAGNOSIS — Z7901 Long term (current) use of anticoagulants: Secondary | ICD-10-CM | POA: Insufficient documentation

## 2016-01-26 DIAGNOSIS — I2510 Atherosclerotic heart disease of native coronary artery without angina pectoris: Secondary | ICD-10-CM | POA: Diagnosis not present

## 2016-01-26 DIAGNOSIS — R112 Nausea with vomiting, unspecified: Secondary | ICD-10-CM

## 2016-01-26 DIAGNOSIS — I130 Hypertensive heart and chronic kidney disease with heart failure and stage 1 through stage 4 chronic kidney disease, or unspecified chronic kidney disease: Secondary | ICD-10-CM | POA: Insufficient documentation

## 2016-01-26 LAB — I-STAT CHEM 8, ED
BUN: 37 mg/dL — ABNORMAL HIGH (ref 6–20)
CALCIUM ION: 1.19 mmol/L (ref 1.15–1.40)
Chloride: 97 mmol/L — ABNORMAL LOW (ref 101–111)
Creatinine, Ser: 2.4 mg/dL — ABNORMAL HIGH (ref 0.44–1.00)
Glucose, Bld: 98 mg/dL (ref 65–99)
HEMATOCRIT: 31 % — AB (ref 36.0–46.0)
HEMOGLOBIN: 10.5 g/dL — AB (ref 12.0–15.0)
Potassium: 3.9 mmol/L (ref 3.5–5.1)
SODIUM: 136 mmol/L (ref 135–145)
TCO2: 26 mmol/L (ref 0–100)

## 2016-01-26 MED ORDER — ONDANSETRON HCL 8 MG PO TABS
8.0000 mg | ORAL_TABLET | Freq: Three times a day (TID) | ORAL | 0 refills | Status: DC | PRN
Start: 2016-01-26 — End: 2016-05-08

## 2016-01-26 MED ORDER — ONDANSETRON 4 MG PREPACK (~~LOC~~): 1.0000 | ORAL_TABLET | Freq: Three times a day (TID) | ORAL | 0 refills | Status: AC | PRN

## 2016-01-26 MED ORDER — SODIUM CHLORIDE 0.9 % IV BOLUS (SEPSIS)
500.0000 mL | Freq: Once | INTRAVENOUS | Status: AC
  Administered 2016-01-26: 500 mL via INTRAVENOUS

## 2016-01-26 MED ORDER — ONDANSETRON 8 MG PO TBDP
8.0000 mg | ORAL_TABLET | Freq: Once | ORAL | Status: AC
  Administered 2016-01-26: 8 mg via ORAL
  Filled 2016-01-26: qty 1

## 2016-01-26 NOTE — ED Notes (Signed)
[facility] aware of pt's blood pressure. Pt is not symptomatic.

## 2016-01-26 NOTE — Discharge Instructions (Signed)
Start with a clear liquid diet and gradually advance to regular foods, over one or 2 days.  Try to drink as much fluid as you can. This will help build up. Blood pressure.

## 2016-01-26 NOTE — ED Notes (Signed)
Pt did good walking and seemed fine

## 2016-01-26 NOTE — ED Notes (Signed)
Pt is to be discharged back to [facility].  Lennette Bihari [phone]

## 2016-01-26 NOTE — ED Provider Notes (Signed)
[facility] DEPT Provider Note   CSN: [id number] Arrival date & time: 01/26/16  2025     History   Chief Complaint Chief Complaint  Patient presents with  . Weakness    HPI [name] is a 61 y.o. female. She complains of nausea, vomiting, diarrhea, which started today. She denies fever, back pain, chest pain, abdominal pain, weakness or dizziness. She was discharged from hospital 2 days ago after admission for GI bleed with low hemoglobin. She was stabilized with blood transfusion, and had an extensive GI evaluation. She lives in a assisted living facility. There are no other known modifying factors.  HPI  Past Medical History:  Diagnosis Date  . Anemia   . Anxiety 2015   . CAD (coronary artery disease)    a. LHC (04/2013): Chronically occluded LAD, moderate dz in large OM1 and severe dz and small posterior lateral vessel    . chronic systolic heart failure    a. ECHO (05/08/13): EF 20-25%, diff HK with akinesis of basal inferior wall, grade 1 DD, trivial MR, trivial TR b) RHC (05/06/13): RA 7, RV 30/2, PA 31/19 (24), PCWP 10, Fick CO/CI: 2.9/1.74, Thermo CO/Ci: 2.4/1.4, PA sat 53%  . Depression   . Diverticulosis   . DNR (do not resuscitate) 06/05/2015   Confirmed with patient on 06/05/2015  . ETOH abuse 1981   started abusing alcohol at age 34, quit   . GERD (gastroesophageal reflux disease) 2015   . History of blood transfusion    "related to losing blood from somewhere; never found out from where"  . Hyperlipidemia 2015  . Hypertension 2015  . Hypothyroidism   . Ischemic cardiomyopathy   . Mass of right breast on mammogram 07/14/2014  . Pancreatitis 1980, 2015   in the setting of ETOH  . Persistent atrial fibrillation ([place]) 2015  . Psoriasis 1968  . Stroke [facility]) 2011   left side weakness, minimal  . Suicidal overdose ([place]) 10/03/13   [place] on Beta Blockers & Xarelto [facility] ED)    Patient Active Problem List   Diagnosis Date Noted  . Symptomatic anemia  01/20/2016  . Elevated INR 01/20/2016  . Heme positive stool 01/20/2016  . Abdominal pain, epigastric 11/30/2015  . Chronic systolic dysfunction of left ventricle 10/30/2015  . DNR (do not resuscitate) 06/05/2015  . Chronic systolic heart failure ([place]) 05/17/2015  . Paroxysmal a-fib ([place]) 03/30/2015  . CKD (chronic kidney disease) stage 5, GFR less than 15 ml/min (HCC) 03/08/2015  . Arm edema   . Low oxygen saturation   . Palliative care encounter   . NSTEMI (non-ST elevated myocardial infarction) ([place]) 10/16/2014  . S/P ICD (internal cardiac defibrillator) procedure, St. 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 Ischemic cardiomyopathy 09/13/2014  . Mass of right breast on mammogram 07/14/2014  . CHF (congestive heart failure) ([place]) 04/09/2014  . CHF exacerbation ([place])   . Iron deficiency anemia 03/01/2014  . Gastritis and gastroduodenitis 02/28/2014  . Cervical cancer screening 12/30/2013  . Hypothyroidism 12/30/2013  . FH: colon cancer 11/18/2013  . Psoriasis 10/22/2013  . Chronic kidney disease, stage 4 (severe) ([place]) 10/22/2013  . Suicide attempt Sept 2015 10/03/2013  . Anxiety state 09/24/2013  . Cardiomyopathy, ischemic- EF 25-30% 2D 09/02/13 09/03/2013  . CAD- total LAD- residual OM2 disease 09/03/2013  . H/O PAF 06/06/2013  . Tobacco abuse 05/09/2013  . Family history of premature CAD 05/09/2013  . H/O alcohol abuse 05/06/2013  . Hypertension   . GERD (gastroesophageal reflux disease)  Past Surgical History:  Procedure Laterality Date  . AGILE CAPSULE N/A 06/02/2014   Procedure: AGILE CAPSULE;  Surgeon: Danie Binder, MD;  Location: [facility] ENDO SUITE;  Service: Endoscopy;  Laterality: N/A;  0800  . BIOPSY N/A 11/22/2013   Procedure: GASTRIC BIOPSIES;  Surgeon: Danie Binder, MD;  Location: [facility] ORS;  Service: Endoscopy;  Laterality: N/A;  . COLONOSCOPY WITH PROPOFOL N/A 03/29/2014   SLF: 1. No definite source for anemia identified 2. 8 colon polyps removed 3. Severe  diverticulosis noted the transverse colon  and right colon 4. Small internal hemorrohids.   . COLOSTOMY REVERSAL  ?2009  . EP IMPLANTABLE DEVICE N/A 09/13/2014   STJ single chamber ICD implanted by Dr Rayann Heman  . EP IMPLANTABLE DEVICE N/A 10/30/2015   STJ CRTD upgrade by Dr Rayann Heman  . ESOPHAGOGASTRODUODENOSCOPY (EGD) WITH PROPOFOL N/A 11/22/2013   Dr. Oneida Alar: gastritis and duodenitis, negative H.pylori  . ESOPHAGOGASTRODUODENOSCOPY (EGD) WITH PROPOFOL N/A 01/22/2016   Procedure: ESOPHAGOGASTRODUODENOSCOPY (EGD) WITH PROPOFOL;  Surgeon: Danie Binder, MD;  Location: [facility] ENDO SUITE;  Service: Endoscopy;  Laterality: N/A;  . GIVENS CAPSULE STUDY N/A 06/10/2014   Procedure: GIVENS CAPSULE STUDY;  Surgeon: Danie Binder, MD;  Location: [facility] END[facility]ITE;  Service: Endoscopy;  Laterality: N/A;  0700  . GIVENS CAPSULE STUDY N/A 01/22/2016   Procedure: GIVENS CAPSULE STUDY;  Surgeon: Danie Binder, MD;  Location: [facility] END[facility];  Service: Endoscopy;  Laterality: N/A;  . ILEOSTOMY  ?2009  . ILEOSTOMY CLOSURE  ?2010  . LEFT AND RIGHT HEART CATHETERIZATION WITH CORONARY ANGIOGRAM N/A 05/10/2013   Procedure: LEFT AND RIGHT HEART CATHETERIZATION WITH CORONARY ANGIOGRAM;  Surgeon: [name], MD;  Location[facility] C[facility] LAB;  Service: Cardiovascular;  Laterality: N/A;  . LEFT AND RIGHT HEART CATHETERIZATION WITH CORONARY ANGIOGRAM N/A 04/14/2014   Procedure: LEFT AND RIGHT HEART CATHETERIZATION WITH CORONARY ANGIOGRAM;  Surgeon: Larey Dresser, MD;  Location: [facility] CATH LAB;  Service: Cardiovascular;  Laterality: N/A;  . LEFT HEART CATHETERIZATION WITH CORONARY ANGIOGRAM N/A 09/03/2013   Procedure: LEFT HEART CATHETERIZATION WITH CORONARY ANGIOGRAM;  Surgeon: Sinclair Grooms, MD;  Location: [facility] CATH LAB;  Service: Cardiovascular;  Laterality: N/A;  . POLYPECTOMY N/A 03/29/2014   Procedure: POLYPECTOMY;  Surgeon: Danie Binder, MD;  Location: [facility] ORS;  Service: Endoscopy;  Laterality: N/A;  ascending colon, sigmoid colon x4,  rectal x3    OB History    No data available       Home Medications    Prior to Admission medications   Medication Sig Start Date End Date Taking? Authorizing Provider  acetaminophen (TYLENOL) 325 MG tablet Take 650 mg by mouth every 6 (six) hours as needed for mild pain.   Yes Historical Provider, MD  ammonium lactate (AMLACTIN) 12 % cream Apply 1 g topically daily as needed for dry skin (apply to elbows).    Yes Historical Provider, MD  atorvastatin (LIPITOR) 80 MG tablet TAKE 1 TABLET BY MOUTH ONCE DAILY. 10/23/15  Yes Larey Dresser, MD  calcium carbonate (OS-CAL - DOSED IN MG OF ELEMENTAL CALCIUM) 1250 (500 Ca) MG tablet Take 1 tablet (500 mg of elemental calcium total) by mouth 3 (three) times daily with meals. 02/24/15  Yes Rexene Alberts, MD  carvedilol (COREG) 12.5 MG tablet TAKE 1 TABLET BY MOUTH TWICE DAILY. 10/23/15  Yes Larey Dresser, MD  clonazePAM (KLONOPIN) 0.5 MG tablet Take 0.5 mg by mouth 2 (two) times daily.   Yes  Historical Provider, MD  ezetimibe (ZETIA) 10 MG tablet TAKE 1 TABLET BY MOUTH ONCE DAILY. 10/23/15  Yes Larey Dresser, MD  levothyroxine (SYNTHROID, LEVOTHROID) 25 MCG tablet TAKE 1/2 TABLET BY MOUTH BEFORE BREAKFAST. 10/23/15  Yes Larey Dresser, MD  lisinopril (PRINIVIL,ZESTRIL) 2.5 MG tablet Take 1 tablet (2.5 mg total) by mouth daily. D/C order for Hydralazine 12/28/15 03/27/16 Yes Josalyn Funches, MD  ondansetron (ZOFRAN) 4 MG tablet Take 4 mg by mouth every 8 (eight) hours as needed for nausea or vomiting. Reported on 07/18/2015   Yes Historical Provider, MD  pantoprazole (PROTONIX) 40 MG tablet TAKE 1 TABLET BY MOUTH TWICE DAILY BEFORE A MEAL. 10/23/15  Yes Larey Dresser, MD  potassium chloride SA (K-DUR,KLOR-CON) 20 MEQ tablet TAKE 1 TABLET BY MOUTH ONCE DAILY. 10/23/15  Yes Larey Dresser, MD  sertraline (ZOLOFT) 50 MG tablet TAKE 2 TABLETS (=TO A 100MG  DOSE) BY MOUTH ONCE DAILY. 10/23/15  Yes Larey Dresser, MD  torsemide (DEMADEX) 20 MG tablet Take 1  tablet (20 mg total) by mouth every other day. 09/29/15  Yes Larey Dresser, MD  warfarin (COUMADIN) 5 MG tablet Take 1 tablet (5 mg total) by mouth daily. 01/25/16  Yes Rosita Fire, MD  nitroGLYCERIN (NITROSTAT) 0.3 MG SL tablet Place 0.3 mg under the tongue every 5 (five) minutes as needed for chest pain (MAX 3 TABLETS). Reported on 07/18/2015    Historical Provider, MD    Family History Family History  Problem Relation Age of Onset  . CAD Father 8    MI  . CAD Mother 10    MI  . Alcohol abuse Mother   . Colon cancer Mother     in her 33s  . CAD Sister 36    Stent  . Diabetes Sister   . CAD Brother 71    MI  . Cancer Neg Hx     Social History Social History  Substance Use Topics  . Smoking status: Current Some Day Smoker    Packs/day: 0.20    Years: 48.00    Types: Cigarettes  . Smokeless tobacco: Never Used  . Alcohol use No     Comment: last drink in [id number], former alcoholic     Allergies   Morphine and related and Penicillins   Review of Systems Review of Systems  All other systems reviewed and are negative.    Physical Exam Updated Vital Signs BP (!) 82/51   Pulse 65   Temp 98.3 F (36.8 C) (Oral)   Resp 18   Ht 5\' 3"  (1.6 m)   Wt 123 lb (55.8 kg)   LMP 01/22/1988 (Approximate)   SpO2 99%   BMI 21.79 kg/m   Physical Exam  Constitutional: She is oriented to person, place, and time. She appears well-developed.  Appears older than stated age.  HENT:  Head: Normocephalic and atraumatic.  Eyes: Conjunctivae and EOM are normal. Pupils are equal, round, and reactive to light.  Neck: Normal range of motion and phonation normal. Neck supple.  Cardiovascular: Normal rate and regular rhythm.   Pulmonary/Chest: Effort normal and breath sounds normal. She exhibits no tenderness.  Abdominal: Soft. She exhibits no distension. There is no tenderness. There is no guarding.  Musculoskeletal: Normal range of motion.  Neurological: She is alert and oriented to  person, place, and time. She exhibits normal muscle tone.  Skin: Skin is warm and dry.  Psychiatric: She has a normal mood and affect. Her behavior is normal.  Judgment and thought content normal.  Nursing note and vitals reviewed.    ED Treatments / Results  Labs (all labs ordered are listed, but only abnormal results are displayed) Labs Reviewed  I-STAT CHEM 8, ED - Abnormal; Notable for the following:       Result Value   Chloride 97 (*)    BUN 37 (*)    Creatinine, Ser 2.40 (*)    Hemoglobin 10.5 (*)    HCT 31.0 (*)    All other components within normal limits    EKG  EKG Interpretation None       Radiology No results found.  Procedures Procedures (including critical care time)  Medications Ordered in ED Medications  ondansetron (ZOFRAN-ODT) disintegrating tablet 8 mg (8 mg Oral Given 01/26/16 2139)  sodium chloride 0.9 % bolus 500 mL (500 mLs Intravenous New Bag/Given 01/26/16 2134)     Initial Impression / Assessment and Plan / ED Course  I have reviewed the triage vital signs and the nursing notes.  Pertinent labs & imaging results that were available during my care of the patient were reviewed by me and considered in my medical decision making (see chart for details).  Clinical Course as of Jan 26 2211  Fri Jan 26, 2016  2046 Low, but similar to prior measurements, 01/24/16. BP: 90/58 [EW]  2210 Supine blood pressure now 100/64  [EW]    Clinical Course User Index [EW] Daleen Bo, MD    Medications  ondansetron (ZOFRAN-ODT) disintegrating tablet 8 mg (8 mg Oral Given 01/26/16 2139)  sodium chloride 0.9 % bolus 500 mL (500 mLs Intravenous New Bag/Given 01/26/16 2134)    Patient Vitals for the past 24 hrs:  BP Temp Temp src Pulse Resp SpO2 Height Weight  01/26/16 2130 (!) 82/51 - - 65 18 99 % - -  01/26/16 2037 - - - - - - 5\' 3"  (1.6 m) 123 lb (55.8 kg)  01/26/16 2036 90/58 98.3 F (36.8 C) Oral 74 16 95 % - -    10:03 PM Reevaluation with update and  discussion. After initial assessment and treatment, an updated evaluation reveals She remains comfortable and is tolerating some oral fluids at this time. Orthostatic blood pressure and pulses were positive. I-STAT labs returned, reassuring, with hemoglobin 10.5. IV infusing, bolus at this time. Reathel Turi L    Final Clinical Impressions(s) / ED Diagnoses   Final diagnoses:  Nausea vomiting and diarrhea   Nonspecific, nausea, vomiting and diarrhea. Patient with recent GI bleed, requiring transfusion. Hemoglobin reassuring today. BUN and creatinine are elevated, marginally higher than yesterday, when the ratio was 27/1.9.  Nursing Notes Reviewed/ Care Coordinated, and agree without changes. Applicable Imaging Reviewed.  Interpretation of Laboratory Data incorporated into ED treatment    New Prescriptions New Prescriptions   No medications on file     Daleen Bo, MD 01/29/16 [phone]

## 2016-01-26 NOTE — ED Triage Notes (Signed)
Pt was just discharged from [facility] yesterday after receiving a blood transfusion for Hgb of 5.   Pt is from [facility], returns tonight for generalized weakness.

## 2016-01-29 ENCOUNTER — Other Ambulatory Visit (HOSPITAL_COMMUNITY): Payer: Medicaid Other

## 2016-01-29 ENCOUNTER — Ambulatory Visit (HOSPITAL_COMMUNITY): Payer: Medicaid Other | Admitting: Oncology

## 2016-01-29 ENCOUNTER — Other Ambulatory Visit (HOSPITAL_COMMUNITY): Payer: Self-pay | Admitting: *Deleted

## 2016-01-29 DIAGNOSIS — D509 Iron deficiency anemia, unspecified: Secondary | ICD-10-CM

## 2016-01-29 NOTE — Progress Notes (Signed)
Patient rescheduled  ROS

## 2016-01-29 NOTE — Assessment & Plan Note (Deleted)
Iron deficiency anemia in the setting of chronic anticoagulation with Coumadin.  She underwent EGD by Dr. Oneida Alar on 01/22/2016 showing gastritis and camera capsule study demonstrating no old or fresh blood in small bowel or colon with ?few ulcers in proximal small bowel.  Recent hospitalization is noted for severe anemia in the setting of supratherapeutic INR, requiring Vit K and 2 unit PRBC on 01/20/2016.  Labs today: CBC diff, iron/TIBC, ferritin.  I personally reviewed and went over laboratory results with the patient.  The results are noted within this dictation.  Labs every 4 weeks: CBC diff, BMET, iron/TIBC, ferritin.  I personally reviewed and went over radiographic studies with the patient.  The results are noted within this dictation.  She is OVERDUE for mammogram.  Order is placed for mammogram.  Return in 12 weeks for follow-up.

## 2016-01-31 ENCOUNTER — Encounter: Payer: Medicaid Other | Admitting: Internal Medicine

## 2016-01-31 MED FILL — Ondansetron HCl Tab 4 MG: ORAL | Qty: 4 | Status: AC

## 2016-02-01 ENCOUNTER — Other Ambulatory Visit (HOSPITAL_COMMUNITY)
Admission: RE | Admit: 2016-02-01 | Discharge: 2016-02-01 | Disposition: A | Discharge status: Home / Self Care | Payer: Medicaid Other | Source: Ambulatory Visit | Attending: Cardiology | Admitting: Cardiology

## 2016-02-01 ENCOUNTER — Ambulatory Visit (INDEPENDENT_AMBULATORY_CARE_PROVIDER_SITE_OTHER): Payer: Medicaid Other | Admitting: *Deleted

## 2016-02-01 DIAGNOSIS — I480 Paroxysmal atrial fibrillation: Secondary | ICD-10-CM

## 2016-02-01 LAB — PROTIME-INR
INR: 5.02
Prothrombin Time: 48 seconds — ABNORMAL HIGH (ref 11.4–15.2)

## 2016-02-01 LAB — POCT INR

## 2016-02-01 MED ORDER — WARFARIN SODIUM 2.5 MG PO TABS: ORAL_TABLET | ORAL | 3 refills | Status: DC

## 2016-02-06 ENCOUNTER — Ambulatory Visit (INDEPENDENT_AMBULATORY_CARE_PROVIDER_SITE_OTHER): Payer: Medicaid Other | Admitting: *Deleted

## 2016-02-06 DIAGNOSIS — I480 Paroxysmal atrial fibrillation: Secondary | ICD-10-CM | POA: Diagnosis not present

## 2016-02-06 LAB — POCT INR: INR: 4.8

## 2016-02-06 MED ORDER — WARFARIN SODIUM 2.5 MG PO TABS: ORAL_TABLET | ORAL | 3 refills | Status: DC

## 2016-02-07 ENCOUNTER — Encounter: Payer: Medicaid Other | Admitting: Internal Medicine

## 2016-02-13 ENCOUNTER — Ambulatory Visit (INDEPENDENT_AMBULATORY_CARE_PROVIDER_SITE_OTHER): Payer: Medicaid Other | Admitting: *Deleted

## 2016-02-13 DIAGNOSIS — I480 Paroxysmal atrial fibrillation: Secondary | ICD-10-CM | POA: Diagnosis not present

## 2016-02-13 LAB — POCT INR: INR: 2.9

## 2016-02-13 MED ORDER — WARFARIN SODIUM 2.5 MG PO TABS: ORAL_TABLET | ORAL | 3 refills | Status: DC

## 2016-02-15 ENCOUNTER — Encounter (HOSPITAL_COMMUNITY): Payer: Medicaid Other

## 2016-02-15 ENCOUNTER — Encounter (HOSPITAL_COMMUNITY): Payer: Medicaid Other | Attending: Oncology | Admitting: Oncology

## 2016-02-15 VITALS — BP 75/52 | HR 73 | Temp 97.5°F | Resp 16 | Ht 63.0 in | Wt 119.7 lb

## 2016-02-15 DIAGNOSIS — E785 Hyperlipidemia, unspecified: Secondary | ICD-10-CM | POA: Diagnosis not present

## 2016-02-15 DIAGNOSIS — Z8673 Personal history of transient ischemic attack (TIA), and cerebral infarction without residual deficits: Secondary | ICD-10-CM | POA: Insufficient documentation

## 2016-02-15 DIAGNOSIS — Z7901 Long term (current) use of anticoagulants: Secondary | ICD-10-CM | POA: Insufficient documentation

## 2016-02-15 DIAGNOSIS — F329 Major depressive disorder, single episode, unspecified: Secondary | ICD-10-CM | POA: Diagnosis not present

## 2016-02-15 DIAGNOSIS — Z95810 Presence of automatic (implantable) cardiac defibrillator: Secondary | ICD-10-CM | POA: Insufficient documentation

## 2016-02-15 DIAGNOSIS — Z8249 Family history of ischemic heart disease and other diseases of the circulatory system: Secondary | ICD-10-CM | POA: Diagnosis not present

## 2016-02-15 DIAGNOSIS — D649 Anemia, unspecified: Secondary | ICD-10-CM

## 2016-02-15 DIAGNOSIS — I481 Persistent atrial fibrillation: Secondary | ICD-10-CM | POA: Insufficient documentation

## 2016-02-15 DIAGNOSIS — E039 Hypothyroidism, unspecified: Secondary | ICD-10-CM | POA: Insufficient documentation

## 2016-02-15 DIAGNOSIS — I110 Hypertensive heart disease with heart failure: Secondary | ICD-10-CM | POA: Diagnosis not present

## 2016-02-15 DIAGNOSIS — F419 Anxiety disorder, unspecified: Secondary | ICD-10-CM | POA: Insufficient documentation

## 2016-02-15 DIAGNOSIS — K219 Gastro-esophageal reflux disease without esophagitis: Secondary | ICD-10-CM | POA: Diagnosis not present

## 2016-02-15 DIAGNOSIS — D509 Iron deficiency anemia, unspecified: Secondary | ICD-10-CM

## 2016-02-15 DIAGNOSIS — I2510 Atherosclerotic heart disease of native coronary artery without angina pectoris: Secondary | ICD-10-CM | POA: Insufficient documentation

## 2016-02-15 DIAGNOSIS — I255 Ischemic cardiomyopathy: Secondary | ICD-10-CM | POA: Diagnosis not present

## 2016-02-15 DIAGNOSIS — F17210 Nicotine dependence, cigarettes, uncomplicated: Secondary | ICD-10-CM | POA: Insufficient documentation

## 2016-02-15 DIAGNOSIS — I5022 Chronic systolic (congestive) heart failure: Secondary | ICD-10-CM | POA: Insufficient documentation

## 2016-02-15 DIAGNOSIS — Z66 Do not resuscitate: Secondary | ICD-10-CM | POA: Diagnosis not present

## 2016-02-15 LAB — CBC WITH DIFFERENTIAL/PLATELET
BASOS ABS: 0.1 10*3/uL (ref 0.0–0.1)
Basophils Relative: 1 %
EOS ABS: 0.2 10*3/uL (ref 0.0–0.7)
EOS PCT: 4 %
HCT: 29.9 % — ABNORMAL LOW (ref 36.0–46.0)
Hemoglobin: 10.2 g/dL — ABNORMAL LOW (ref 12.0–15.0)
LYMPHS PCT: 28 %
Lymphs Abs: 1.7 10*3/uL (ref 0.7–4.0)
MCH: 31.6 pg (ref 26.0–34.0)
MCHC: 34.1 g/dL (ref 30.0–36.0)
MCV: 92.6 fL (ref 78.0–100.0)
Monocytes Absolute: 0.6 10*3/uL (ref 0.1–1.0)
Monocytes Relative: 9 %
NEUTROS PCT: 58 %
Neutro Abs: 3.6 10*3/uL (ref 1.7–7.7)
PLATELETS: 280 10*3/uL (ref 150–400)
RBC: 3.23 MIL/uL — AB (ref 3.87–5.11)
RDW: 12.9 % (ref 11.5–15.5)
WBC: 6.1 10*3/uL (ref 4.0–10.5)

## 2016-02-15 LAB — FERRITIN: Ferritin: 44 ng/mL (ref 11–307)

## 2016-02-15 LAB — IRON AND TIBC
IRON: 30 ug/dL (ref 28–170)
SATURATION RATIOS: 10 % — AB (ref 10.4–31.8)
TIBC: 302 ug/dL (ref 250–450)
UIBC: 272 ug/dL

## 2016-02-15 LAB — SAMPLE TO BLOOD BANK

## 2016-02-15 NOTE — Patient Instructions (Addendum)
[place] at [facility] Discharge Instructions  RECOMMENDATIONS MADE BY THE CONSULTANT AND ANY TEST RESULTS WILL BE SENT TO YOUR REFERRING PHYSICIAN.  You were seen today by Kirby Crigler PA-C. Return in 4 weeks for labs and follow up.    Thank you for choosing [place] at [facility] to provide your oncology and hematology care.  To afford each patient quality time with our provider, please arrive at least 15 minutes before your scheduled appointment time.    If you have a lab appointment with the [facility] please come in thru the  Main Entrance and check in at the main information desk  You need to re-schedule your appointment should you arrive 10 or more minutes late.  We strive to give you quality time with our providers, and arriving late affects you and other patients whose appointments are after yours.  Also, if you no show three or more times for appointments you may be dismissed from the clinic at the providers discretion.     Again, thank you for choosing [facility].  Our hope is that these requests will decrease the amount of time that you wait before being seen by our physicians.       _____________________________________________________________  Should you have questions after your visit to [facility], please contact our office at [phone] between the hours of 8:30 a.m. and 4:30 p.m.  Voicemails left after 4:30 p.m. will not be returned until the following business day.  For prescription refill requests, have your pharmacy contact our office.       Resources For Cancer Patients and their Caregivers ? American Cancer Society: Can assist with transportation, wigs, general needs, runs Look Good Feel Better.        609-773-6770 ? Cancer Care: Provides financial assistance, online support groups, medication/co-pay assistance.  1-800-813-HOPE 640-766-5834) ? [place] Assists [place] cancer patients and their families through emotional , educational and financial support.  (346)455-6139 ? [place] DSS Where to apply for food stamps, Medicaid and utility assistance. 9415401941 ? [facility]: Transportation to medical appointments. 684-362-9664 ? Social Security Administration: May apply for disability if have a Stage IV cancer. 6096426310 7788068758 ? [employer], Disability and Transit Services: Assists with nutrition, care and transit needs. [place] Support Programs: @10RELATIVEDAYS @ > Cancer Support Group  2nd Tuesday of the month 1pm-2pm, Journey Room  > Creative Journey  3rd Tuesday of the month 1130am-1pm, Journey Room  > Look Good Feel Better  1st Wednesday of the month 10am-12 noon, Journey Room (Call [place] to register [phone])

## 2016-02-15 NOTE — Assessment & Plan Note (Signed)
Iron deficiency anemia in the setting of chronic anticoagulation with Coumadin.  She underwent EGD by Dr. Oneida Alar on 01/22/2016 showing gastritis and camera capsule study demonstrating no old or fresh blood in small bowel or colon with ?few ulcers in proximal small bowel.  Recent hospitalization is noted for severe anemia in the setting of supratherapeutic INR, requiring Vit K and 2 unit PRBC on 01/20/2016.  Labs today: CBC diff, iron/TIBC, ferritin.  I personally reviewed and went over laboratory results with the patient.  The results are noted within this dictation.  Labs every 4 weeks: CBC diff, BMET, iron/TIBC, ferritin.  I personally reviewed and went over radiographic studies with the patient.  The results are noted within this dictation.  She is OVERDUE for mammogram.  Order is placed for mammogram.  Return in 12 weeks for follow-up.

## 2016-02-15 NOTE — Progress Notes (Signed)
[place], [place] [address]  Iron deficiency anemia, unspecified iron deficiency anemia type  CURRENT THERAPY: IV iron replacement therapy.  INTERVAL HISTORY: [name] 61 y.o. female returns for followup of severe iron deficiency anemia requiring IV iron replacement therapy.  Chart is reviewed and recent hospitalization is noted for symptomatic anemia in the setting of supratherapeutic INR, requiring Vit K and 2 units of PRBCs on 01/20/2016.  She underwent EGD by Dr. Oneida Alar on 01/22/2016 showing gastritis and camera capsule study demonstrating no old or fresh blood in small bowel or colon with ?few ulcers in proximal small bowel.  Her drop in HGB was noted in mid-December and therefore, she was set-up for 2 unit PRBC and IV iron on 01/04/2016.  She was a no-show for this appointment.  She subsequently was admitted as mentioned above.  She thinks her INR was elevated at time of admission because she was placed on Keflex prior to due to infection.  She notes that she is doing well.  She denies any known blood loss.  She denies any dark stool or bloody stools.  She denies any known blood loss.   Review of Systems  Constitutional: Negative.  Negative for chills, fever and weight loss.  HENT: Negative.  Negative for nosebleeds.   Eyes: Negative.   Respiratory: Negative.  Negative for cough and hemoptysis.   Cardiovascular: Negative.  Negative for chest pain.  Gastrointestinal: Negative.  Negative for blood in stool and melena.  Genitourinary: Negative.  Negative for hematuria.  Musculoskeletal: Negative.  Negative for falls.  Skin: Negative.   Neurological: Negative.  Negative for weakness.  Endo/Heme/Allergies: Negative.  Does not bruise/bleed easily.  Psychiatric/Behavioral: Negative.     Past Medical History:  Diagnosis Date  . Anemia   . Anxiety 2015   . CAD (coronary artery disease)    a. LHC (04/2013): Chronically occluded  LAD, moderate dz in large OM1 and severe dz and small posterior lateral vessel    . chronic systolic heart failure    a. ECHO (05/08/13): EF 20-25%, diff HK with akinesis of basal inferior wall, grade 1 DD, trivial MR, trivial TR b) RHC (05/06/13): RA 7, RV 30/2, PA 31/19 (24), PCWP 10, Fick CO/CI: 2.9/1.74, Thermo CO/Ci: 2.4/1.4, PA sat 53%  . Depression   . Diverticulosis   . DNR (do not resuscitate) 06/05/2015   Confirmed with patient on 06/05/2015  . ETOH abuse 1981   started abusing alcohol at age 59, quit   . GERD (gastroesophageal reflux disease) 2015   . History of blood transfusion    "related to losing blood from somewhere; never found out from where"  . Hyperlipidemia 2015  . Hypertension 2015  . Hypothyroidism   . Ischemic cardiomyopathy   . Mass of right breast on mammogram 07/14/2014  . Pancreatitis 1980, 2015   in the setting of ETOH  . Persistent atrial fibrillation ([place]) 2015  . Psoriasis 1968  . Stroke [facility]) 2011   left side weakness, minimal  . Suicidal overdose ([place]) 10/03/13   Overdoes on Beta Blockers & Xarelto ([place])    Past Surgical History:  Procedure Laterality Date  . AGILE CAPSULE N/A 06/02/2014   Procedure: AGILE CAPSULE;  Surgeon: Danie Binder, MD;  Location: [facility] END[facility];  Service: Endoscopy;  Laterality: N/A;  0800  . BIOPSY N/A 11/22/2013   Procedure: GASTRIC BIOPSIES;  Surgeon: Danie Binder, MD;  Location: [facility] ORS;  Service: Endoscopy;  Laterality: N/A;  . COLONOSCOPY WITH PROPOFOL N/A 03/29/2014   SLF: 1. No definite source for anemia identified 2. 8 colon polyps removed 3. Severe diverticulosis noted the transverse colon  and right colon 4. Small internal hemorrohids.   . COLOSTOMY REVERSAL  ?2009  . EP IMPLANTABLE DEVICE N/A 09/13/2014   STJ single chamber ICD implanted by Dr Rayann Heman  . EP IMPLANTABLE DEVICE N/A 10/30/2015   STJ CRTD upgrade by Dr Rayann Heman  . ESOPHAGOGASTRODUODENOSCOPY (EGD) WITH PROPOFOL N/A 11/22/2013   Dr. Oneida Alar: gastritis and  duodenitis, negative H.pylori  . ESOPHAGOGASTRODUODENOSCOPY (EGD) WITH PROPOFOL N/A 01/22/2016   Procedure: ESOPHAGOGASTRODUODENOSCOPY (EGD) WITH PROPOFOL;  Surgeon: Danie Binder, MD;  [facility];  Service: Endoscopy;  Laterality: N/A;  . GIVENS CAPSULE STUDY N/A 06/10/2014   Procedure: GIVENS CAPSULE STUDY;  Surgeon: Danie Binder, MD;  [facility];  Service: Endoscopy;  Laterality: N/A;  0700  . GIVENS CAPSULE STUDY N/A 01/22/2016   Procedure: GIVENS CAPSULE STUDY;  Surgeon: Danie Binder, MD;  [facility];  Service: Endoscopy;  Laterality: N/A;  . ILEOSTOMY  ?2009  . ILEOSTOMY CLOSURE  ?2010  . LEFT AND RIGHT HEART CATHETERIZATION WITH CORONARY ANGIOGRAM N/A 05/10/2013   Procedure: LEFT AND RIGHT HEART CATHETERIZATION WITH CORONARY ANGIOGRAM;  Surgeon: [name], MD;  Location: [facility] CATH LAB;  Service: Cardiovascular;  Laterality: N/A;  . LEFT AND RIGHT HEART CATHETERIZATION WITH CORONARY ANGIOGRAM N/A 04/14/2014   Procedure: LEFT AND RIGHT HEART CATHETERIZATION WITH CORONARY ANGIOGRAM;  Surgeon: Larey Dresser, MD;  Location: [facility] CATH LAB;  Service: Cardiovascular;  Laterality: N/A;  . LEFT HEART CATHETERIZATION WITH CORONARY ANGIOGRAM N/A 09/03/2013   Procedure: LEFT HEART CATHETERIZATION WITH CORONARY ANGIOGRAM;  Surgeon: Sinclair Grooms, MD;  Location: [facility] CATH LAB;  Service: Cardiovascular;  Laterality: N/A;  . POLYPECTOMY N/A 03/29/2014   Procedure: POLYPECTOMY;  Surgeon: Danie Binder, MD;  Location: [facility] ORS;  Service: Endoscopy;  Laterality: N/A;  ascending colon, sigmoid colon x4, rectal x3    Family History  Problem Relation Age of Onset  . CAD Father 54    MI  . CAD Mother 61    MI  . Alcohol abuse Mother   . Colon cancer Mother     in her 27s  . CAD Sister 25    Stent  . Diabetes Sister   . CAD Brother 33    MI  . Cancer Neg Hx     Social History   Social History  . Marital status: Married    Spouse name: N/A  . Number of  children: 2  . Years of education: 10   Occupational History  . Unemployed     Social History Main Topics  . Smoking status: Current Some Day Smoker    Packs/day: 0.20    Years: 48.00    Types: Cigarettes  . Smokeless tobacco: Never Used  . Alcohol use No     Comment: last drink in [id number], former alcoholic  . Drug use: No  . Sexual activity: No   Other Topics Concern  . Not on file   Social History Narrative   Lived previously with her spouse in an [place].     Son and daughter   Daughter in [place]   Son in [place]   Two grandchildren.       Presently in a group home [place], moved in 10/13/13.  PHYSICAL EXAMINATION  ECOG PERFORMANCE STATUS: 1 - Symptomatic but completely ambulatory  Vitals:   02/15/16 1452  BP: (!) 75/52  Pulse: 73  Resp: 16  Temp: 97.5 F (36.4 C)    GENERAL:alert, no distress, well nourished, well developed, comfortable, cooperative, smiling and chronically ill appearing, accompanied by aid. SKIN: skin color, texture, turgor are normal, no rashes or significant lesions HEAD: Normocephalic, No masses, lesions, tenderness or abnormalities EYES: normal, EOMI, Conjunctiva are pink and non-injected EARS: External ears normal OROPHARYNX:lips, buccal mucosa, and tongue normal and mucous membranes are moist  NECK: supple, no adenopathy, trachea midline LYMPH:  no palpable lymphadenopathy BREAST:not examined LUNGS: clear to auscultation and percussion HEART: regular rate & rhythm, no murmurs and no gallops ABDOMEN:abdomen soft and normal bowel sounds BACK: Back symmetric, no curvature. EXTREMITIES:less then 2 second capillary refill, no joint deformities, effusion, or inflammation, no skin discoloration, no cyanosis  NEURO: alert & oriented x 3 with fluent speech, no focal motor/sensory deficits, gait normal   LABORATORY DATA: CBC    Component Value Date/Time   WBC 6.1 02/15/2016 1401   RBC 3.23 (L) 02/15/2016 1401    HGB 10.2 (L) 02/15/2016 1401   HCT 29.9 (L) 02/15/2016 1401   PLT 280 02/15/2016 1401   MCV 92.6 02/15/2016 1401   MCH 31.6 02/15/2016 1401   MCHC 34.1 02/15/2016 1401   RDW 12.9 02/15/2016 1401   LYMPHSABS 1.7 02/15/2016 1401   MONOABS 0.6 02/15/2016 1401   EOSABS 0.2 02/15/2016 1401   BASOSABS 0.1 02/15/2016 1401      Chemistry      Component Value Date/Time   NA 136 01/26/2016 2146   K 3.9 01/26/2016 2146   CL 97 (L) 01/26/2016 2146   CO2 27 01/25/2016 0447   BUN 37 (H) 01/26/2016 2146   CREATININE 2.40 (H) 01/26/2016 2146      Component Value Date/Time   CALCIUM 8.8 (L) 01/25/2016 0447   ALKPHOS 43 01/20/2016 1754   AST 23 01/20/2016 1754   ALT 19 01/20/2016 1754   BILITOT 0.4 01/20/2016 1754     Lab Results  Component Value Date   IRON 62 10/09/2015   TIBC 287 10/09/2015   FERRITIN 79 01/01/2016    PENDING LABS:   RADIOGRAPHIC STUDIES:  Dg Chest Portable 1 View  Result Date: 01/20/2016 CLINICAL DATA:  Generalized weakness.  Nausea .  Lack of appetite. EXAM: PORTABLE CHEST 1 VIEW COMPARISON:  10/31/2015 FINDINGS: Heart size is normal. Mediastinal shadows are normal. Pacemaker/AICD remains in place. The vascularity is normal. The lungs are clear. No effusions. IMPRESSION: No active disease. Electronically Signed   By: Nelson Chimes M.D.   On: 01/20/2016 19:13     PATHOLOGY:    ASSESSMENT AND PLAN:  Iron deficiency anemia Iron deficiency anemia in the setting of chronic anticoagulation with Coumadin.  She underwent EGD by Dr. Oneida Alar on 01/22/2016 showing gastritis and camera capsule study demonstrating no old or fresh blood in small bowel or colon with ?few ulcers in proximal small bowel.  Recent hospitalization is noted for severe anemia in the setting of supratherapeutic INR, requiring Vit K and 2 unit PRBC on 01/20/2016.  Labs today: CBC diff, iron/TIBC, ferritin.  I personally reviewed and went over laboratory results with the patient.  The results are  noted within this dictation.  Labs every 4 weeks: CBC diff, BMET, iron/TIBC, ferritin.  I personally reviewed and went over radiographic studies with the patient.  The results are noted within this  dictation.  She is OVERDUE for mammogram.  Order is placed for mammogram.  Return in 12 weeks for follow-up.   ORDERS PLACED FOR THIS ENCOUNTER: No orders of the defined types were placed in this encounter.   MEDICATIONS PRESCRIBED THIS ENCOUNTER: No orders of the defined types were placed in this encounter.   THERAPY PLAN:  Close monitoring of iron studies, with IV iron replacement when indicated.  All questions were answered. The patient knows to call the clinic with any problems, questions or concerns. We can certainly see the patient much sooner if necessary.  Patient and plan discussed with Dr. Ancil Linsey and she is in agreement with the aforementioned.   This note is electronically signed by: Doy Mince 02/15/2016 6:36 PM

## 2016-02-16 ENCOUNTER — Other Ambulatory Visit (HOSPITAL_COMMUNITY): Payer: Self-pay | Admitting: Oncology

## 2016-02-21 ENCOUNTER — Ambulatory Visit (HOSPITAL_COMMUNITY): Payer: Medicaid Other

## 2016-02-28 ENCOUNTER — Encounter (HOSPITAL_COMMUNITY): Payer: Self-pay

## 2016-02-28 ENCOUNTER — Encounter (HOSPITAL_COMMUNITY): Payer: Medicaid Other | Attending: Hematology & Oncology

## 2016-02-28 VITALS — BP 93/56 | HR 60 | Temp 97.6°F | Resp 18

## 2016-02-28 DIAGNOSIS — Z800 Family history of malignant neoplasm of digestive organs: Secondary | ICD-10-CM | POA: Insufficient documentation

## 2016-02-28 DIAGNOSIS — D509 Iron deficiency anemia, unspecified: Secondary | ICD-10-CM | POA: Diagnosis present

## 2016-02-28 MED ORDER — SODIUM CHLORIDE 0.9 % IV SOLN
510.0000 mg | Freq: Once | INTRAVENOUS | Status: AC
  Administered 2016-02-28: 510 mg via INTRAVENOUS
  Filled 2016-02-28: qty 17

## 2016-02-28 MED ORDER — SODIUM CHLORIDE 0.9 % IV SOLN
Freq: Once | INTRAVENOUS | Status: AC
  Administered 2016-02-28: 14:00:00 via INTRAVENOUS

## 2016-02-28 NOTE — Patient Instructions (Signed)
[facility] at [facility] Discharge Instructions  RECOMMENDATIONS MADE BY THE CONSULTANT AND ANY TEST RESULTS WILL BE SENT TO YOUR REFERRING PHYSICIAN.  Iron infusion today. Return as scheduled.   Thank you for choosing [facility] at [facility] to provide your oncology and hematology care.  To afford each patient quality time with our provider, please arrive at least 15 minutes before your scheduled appointment time.    If you have a lab appointment with the Cancer Center please come in thru the  Main Entrance and check in at the main information desk  You need to re-schedule your appointment should you arrive 10 or more minutes late.  We strive to give you quality time with our providers, and arriving late affects you and other patients whose appointments are after yours.  Also, if you no show three or more times for appointments you may be dismissed from the clinic at the providers discretion.     Again, thank you for choosing [facility].  Our hope is that these requests will decrease the amount of time that you wait before being seen by our physicians.       _____________________________________________________________  Should you have questions after your visit to [facility], please contact our office at [phone] between the hours of 8:30 a.m. and 4:30 p.m.  Voicemails left after 4:30 p.m. will not be returned until the following business day.  For prescription refill requests, have your pharmacy contact our office.       Resources For Cancer Patients and their Caregivers ? American Cancer Society: Can assist with transportation, wigs, general needs, runs Look Good Feel Better.        1-888-227-6333 ? Cancer Care: Provides financial assistance, online support groups, medication/co-pay assistance.  1-800-813-HOPE (4673) ? [facility] Assists [place] cancer patients and their  families through emotional , educational and financial support.  336-427-4357 ? [place] DSS Where to apply for food stamps, Medicaid and utility assistance. 336-342-1394 ? [facility]: Transportation to medical appointments. 336-347-2287 ? Social Security Administration: May apply for disability if have a Stage IV cancer. 336-342-7796 1-800-772-1213 ? [place] Aging, Disability and Transit Services: Assists with nutrition, care and transit needs. 336-349-2343  Cancer Center Support Programs: @10RELATIVEDAYS@ > Cancer Support Group  2nd Tuesday of the month 1pm-2pm, Journey Room  > Creative Journey  3rd Tuesday of the month 1130am-1pm, Journey Room  > Look Good Feel Better  1st Wednesday of the month 10am-12 noon, Journey Room (Call American Cancer Society to register 1-800-395-5775)   

## 2016-02-28 NOTE — Progress Notes (Signed)
Tolerated infusion w/o adverse reaction.  Alert, in no distress.  VSS.  Discharged ambulatory.  

## 2016-02-29 ENCOUNTER — Ambulatory Visit (INDEPENDENT_AMBULATORY_CARE_PROVIDER_SITE_OTHER): Payer: Medicaid Other | Admitting: *Deleted

## 2016-02-29 DIAGNOSIS — I480 Paroxysmal atrial fibrillation: Secondary | ICD-10-CM | POA: Diagnosis not present

## 2016-02-29 LAB — POCT INR: INR: 1.3

## 2016-02-29 MED ORDER — WARFARIN SODIUM 2.5 MG PO TABS: ORAL_TABLET | ORAL | 3 refills | Status: DC

## 2016-03-04 ENCOUNTER — Ambulatory Visit (INDEPENDENT_AMBULATORY_CARE_PROVIDER_SITE_OTHER): Payer: Medicaid Other | Admitting: Nurse Practitioner

## 2016-03-04 ENCOUNTER — Encounter: Payer: Self-pay | Admitting: Nurse Practitioner

## 2016-03-04 VITALS — BP 77/57 | HR 64 | Temp 98.4°F | Ht 63.0 in | Wt 118.8 lb

## 2016-03-04 DIAGNOSIS — D509 Iron deficiency anemia, unspecified: Secondary | ICD-10-CM

## 2016-03-04 DIAGNOSIS — K219 Gastro-esophageal reflux disease without esophagitis: Secondary | ICD-10-CM | POA: Diagnosis not present

## 2016-03-04 NOTE — Assessment & Plan Note (Signed)
Currently well-controlled on PPI. Continue medications, return for follow-up in 3 months.

## 2016-03-04 NOTE — Assessment & Plan Note (Signed)
History of iron deficiency anemia. Recently admitted for profound anemia with supratherapeutic INR likely related to recent antibiotic prescription. EGD and [place] capsule endoscopy completed without significant findings. She is due for colonoscopy in March 2019. We will have her return in 3 months to trend her anemia and consider early interval colonoscopy depending on her progress. Continue to see hematology.

## 2016-03-04 NOTE — Progress Notes (Signed)
Referring Provider: Rosita Fire, MD Primary Care Physician:  Rosita Fire, MD Primary GI:  Dr. Oneida Alar  Chief Complaint  Patient presents with  . Gastroesophageal Reflux    f/u, doing ok    HPI:   [name] is a 61 y.o. female who presents for follow-up on GERD. The patient was last seen in our office 11/30/2015 at which point she was doing well overall, GERD well-controlled, still with "not" sensation in the epigastric area which gets role hardened and rolls up under her rib cage. Associated with pain and typically lasts until she lays flat or repositions. She was referred to [facility] for manometry for possible esophageal spasm but did not show for the appointment. Since we last saw her she was admitted for symptomatic anemia and melena. Upper endoscopy and Givens capsule endoscopy were unrevealing. She was referred to hematology, recommend if persistent anemia may need to update colonoscopy. She was last seen by hematology 02/15/2016 and at that time the patient noted no obvious blood loss. Labs are drawn including CBC, iron/TIBC, ferritin. CBC with persistent anemia of 10.2 which is within her baseline. Iron is normal at 30, ferritin is low normal at 44. She was scheduled for IV iron infusion.  Colonoscopy was last completed 03/29/2014 which found severe diverticulosis, 8 colon polyps removed, no definite source for anemia, small internal hemorrhoids. Polyps found to be simple adenoma. Recommend next colonoscopy in 3 years  Today she states she's doing well overall. GERD is well-controlled on PPI. Denies abdominal pain. Rare nausea, no vomiting. Denies hematochezia, melena. Energy is good, appetite is ok without worsening. Denies unintentional weight loss. "Knott sensation" has resolved. Denies chest pain, dyspnea, dizziness, lightheadedness, syncope, near syncope. Denies any other upper or lower GI symptoms.  Past Medical History:  Diagnosis Date  . Anemia   . Anxiety 2015   . CAD  (coronary artery disease)    a. LHC (04/2013): Chronically occluded LAD, moderate dz in large OM1 and severe dz and small posterior lateral vessel    . chronic systolic heart failure    a. ECHO (05/08/13): EF 20-25%, diff HK with akinesis of basal inferior wall, grade 1 DD, trivial MR, trivial TR b) RHC (05/06/13): RA 7, RV 30/2, PA 31/19 (24), PCWP 10, Fick CO/CI: 2.9/1.74, Thermo CO/Ci: 2.4/1.4, PA sat 53%  . Depression   . Diverticulosis   . DNR (do not resuscitate) 06/05/2015   Confirmed with patient on 06/05/2015  . ETOH abuse 1981   started abusing alcohol at age 53, quit   . GERD (gastroesophageal reflux disease) 2015   . History of blood transfusion    "related to losing blood from somewhere; never found out from where"  . Hyperlipidemia 2015  . Hypertension 2015  . Hypothyroidism   . Ischemic cardiomyopathy   . Mass of right breast on mammogram 07/14/2014  . Pancreatitis 1980, 2015   in the setting of ETOH  . Persistent atrial fibrillation ([place]) 2015  . Psoriasis 1968  . Stroke [facility]) 2011   left side weakness, minimal  . Suicidal overdose ([place]) 10/03/13   Overdoes on Beta Blockers & Xarelto ([place])    Past Surgical History:  Procedure Laterality Date  . AGILE CAPSULE N/A 06/02/2014   Procedure: AGILE CAPSULE;  Surgeon: Danie Binder, MD;  Location: [facility] ENDO SUITE;  Service: Endoscopy;  Laterality: N/A;  0800  . BIOPSY N/A 11/22/2013   Procedure: GASTRIC BIOPSIES;  Surgeon: Danie Binder, MD;  Location: [facility] ORS;  Service: Endoscopy;  Laterality: N/A;  . COLONOSCOPY WITH PROPOFOL N/A 03/29/2014   SLF: 1. No definite source for anemia identified 2. 8 colon polyps removed 3. Severe diverticulosis noted the transverse colon  and right colon 4. Small internal hemorrohids.   . COLOSTOMY REVERSAL  ?2009  . EP IMPLANTABLE DEVICE N/A 09/13/2014   STJ single chamber ICD implanted by Dr Rayann Heman  . EP IMPLANTABLE DEVICE N/A 10/30/2015   STJ CRTD upgrade by Dr Rayann Heman  .  ESOPHAGOGASTRODUODENOSCOPY (EGD) WITH PROPOFOL N/A 11/22/2013   Dr. Oneida Alar: gastritis and duodenitis, negative H.pylori  . ESOPHAGOGASTRODUODENOSCOPY (EGD) WITH PROPOFOL N/A 01/22/2016   Procedure: ESOPHAGOGASTRODUODENOSCOPY (EGD) WITH PROPOFOL;  Surgeon: Danie Binder, MD;  Location: [facility] ENDO SUITE;  Service: Endoscopy;  Laterality: N/A;  . GIVENS CAPSULE STUDY N/A 06/10/2014   Procedure: GIVENS CAPSULE STUDY;  Surgeon: Danie Binder, MD;  Location: [facility] END[facility];  Service: Endoscopy;  Laterality: N/A;  0700  . GIVENS CAPSULE STUDY N/A 01/22/2016   Procedure: GIVENS CAPSULE STUDY;  Surgeon: Danie Binder, MD;  Location: [facility] END[facility];  Service: Endoscopy;  Laterality: N/A;  . ILEOSTOMY  ?2009  . ILEOSTOMY CLOSURE  ?2010  . LEFT AND RIGHT HEART CATHETERIZATION WITH CORONARY ANGIOGRAM N/A 05/10/2013   Procedure: LEFT AND RIGHT HEART CATHETERIZATION WITH CORONARY ANGIOGRAM;  Surgeon: [name], MD;  Location: [facility] CATH LAB;  Service: Cardiovascular;  Laterality: N/A;  . LEFT AND RIGHT HEART CATHETERIZATION WITH CORONARY ANGIOGRAM N/A 04/14/2014   Procedure: LEFT AND RIGHT HEART CATHETERIZATION WITH CORONARY ANGIOGRAM;  Surgeon: Larey Dresser, MD;  Location: [facility] CATH LAB;  Service: Cardiovascular;  Laterality: N/A;  . LEFT HEART CATHETERIZATION WITH CORONARY ANGIOGRAM N/A 09/03/2013   Procedure: LEFT HEART CATHETERIZATION WITH CORONARY ANGIOGRAM;  Surgeon: Sinclair Grooms, MD;  Location: [facility] CATH LAB;  Service: Cardiovascular;  Laterality: N/A;  . POLYPECTOMY N/A 03/29/2014   Procedure: POLYPECTOMY;  Surgeon: Danie Binder, MD;  Location: [facility] ORS;  Service: Endoscopy;  Laterality: N/A;  ascending colon, sigmoid colon x4, rectal x3    Current Outpatient Prescriptions  Medication Sig Dispense Refill  . acetaminophen (TYLENOL) 325 MG tablet Take 650 mg by mouth every 6 (six) hours as needed for mild pain.    Marland Kitchen ammonium lactate (AMLACTIN) 12 % cream Apply 1 g topically daily as needed for dry skin  (apply to elbows).     Marland Kitchen atorvastatin (LIPITOR) 80 MG tablet TAKE 1 TABLET BY MOUTH ONCE DAILY. 30 tablet 3  . calcium carbonate (OS-CAL - DOSED IN MG OF ELEMENTAL CALCIUM) 1250 (500 Ca) MG tablet Take 1 tablet (500 mg of elemental calcium total) by mouth 3 (three) times daily with meals. 90 tablet 3  . carvedilol (COREG) 12.5 MG tablet TAKE 1 TABLET BY MOUTH TWICE DAILY. 60 tablet 3  . clonazePAM (KLONOPIN) 0.5 MG tablet Take 0.5 mg by mouth 2 (two) times daily.    Marland Kitchen ezetimibe (ZETIA) 10 MG tablet TAKE 1 TABLET BY MOUTH ONCE DAILY. 30 tablet 3  . levothyroxine (SYNTHROID, LEVOTHROID) 25 MCG tablet TAKE 1/2 TABLET BY MOUTH BEFORE BREAKFAST. 15 tablet 3  . lisinopril (PRINIVIL,ZESTRIL) 2.5 MG tablet Take 1 tablet (2.5 mg total) by mouth daily. D/C order for Hydralazine 30 tablet 6  . nitroGLYCERIN (NITROSTAT) 0.3 MG SL tablet Place 0.3 mg under the tongue every 5 (five) minutes as needed for chest pain (MAX 3 TABLETS). Reported on 07/18/2015    . ondansetron (ZOFRAN) 4 mg TABS tablet Take  4 tablets by mouth every 8 (eight) hours as needed. 4 tablet 0  . ondansetron (ZOFRAN) 8 MG tablet Take 1 tablet (8 mg total) by mouth every 8 (eight) hours as needed for nausea or vomiting. 20 tablet 0  . pantoprazole (PROTONIX) 40 MG tablet TAKE 1 TABLET BY MOUTH TWICE DAILY BEFORE A MEAL. 60 tablet 3  . potassium chloride SA (K-DUR,KLOR-CON) 20 MEQ tablet TAKE 1 TABLET BY MOUTH ONCE DAILY. 30 tablet 3  . sertraline (ZOLOFT) 50 MG tablet TAKE 2 TABLETS (=TO A 100MG  DOSE) BY MOUTH ONCE DAILY. 60 tablet 3  . torsemide (DEMADEX) 20 MG tablet Take 1 tablet (20 mg total) by mouth every other day. 15 tablet 2  . warfarin (COUMADIN) 2.5 MG tablet Take 2 tablets x 3 days (Thurs,Fri,Sat) then resume 1 tablet daily except 1 1/2 tablets on Tuesdays, Thursdays and Saturdays.  Recheck INR on 03/12/16 45 tablet 3   No current facility-administered medications for this visit.     Allergies as of 03/04/2016 - Review Complete  03/04/2016  Allergen Reaction Noted  . Morphine and related Other (See Comments) 09/24/2013  . Penicillins Anaphylaxis and Rash 05/06/2013    Family History  Problem Relation Age of Onset  . CAD Father 77    MI  . CAD Mother 46    MI  . Alcohol abuse Mother   . Colon cancer Mother     in her 48s  . CAD Sister 52    Stent  . Diabetes Sister   . CAD Brother 2    MI  . Cancer Neg Hx     Social History   Social History  . Marital status: Married    Spouse name: N/A  . Number of children: 2  . Years of education: 10   Occupational History  . Unemployed     Social History Main Topics  . Smoking status: Current Some Day Smoker    Packs/day: 0.20    Years: 48.00    Types: Cigarettes  . Smokeless tobacco: Never Used  . Alcohol use No     Comment: last drink in [id number], former alcoholic  . Drug use: No  . Sexual activity: No   Other Topics Concern  . None   Social History Narrative   Lived previously with her spouse in an [place].     Son and daughter   Daughter in [place]   Son in [place]   Two grandchildren.       Presently in a group home [facility], moved in 10/13/13.           Review of Systems: Complete ROS negative except as per HPI.   Physical Exam: BP (!) 77/57   Pulse 64   Temp 98.4 F (36.9 C) (Oral)   Ht 5\' 3"  (1.6 m)   Wt 118 lb 12.8 oz (53.9 kg)   LMP 01/22/1988 (Approximate)   BMI 21.04 kg/m  General:   Alert and oriented. Pleasant and cooperative. Well-nourished and well-developed.  Ears:  Normal auditory acuity. Cardiovascular:  S1, S2 present without murmurs appreciated. Extremities without clubbing or edema. Respiratory:  Clear to auscultation bilaterally. No wheezes, rales, or rhonchi. No distress.  Gastrointestinal:  +BS, soft, non-tender and non-distended. No HSM noted. No guarding or rebound. No masses appreciated.  Rectal:  Deferred  Musculoskalatal:  Symmetrical without gross deformities. Neurologic:  Alert and  oriented x4;  grossly normal neurologically. Psych:  Alert and cooperative. Normal mood and affect. Heme/Lymph/Immune: No  excessive bruising noted.    03/04/2016 10:02 AM   Disclaimer: This note was dictated with voice recognition software. Similar sounding words can inadvertently be transcribed and may not be corrected upon review.

## 2016-03-04 NOTE — Progress Notes (Signed)
cc'ed to pcp °

## 2016-03-04 NOTE — Patient Instructions (Signed)
1. Continue taking Protonix (your acid blocker). 2. Continue to see hematology to manage your anemia and low iron. 3. Return for follow-up here in 3 months so we can see how your anemia has been doing. Depending on how it has been doing, we may need to update your colonoscopy before next March. 4. Regardless, you are due for a repeat colonoscopy in March 2019.

## 2016-03-05 ENCOUNTER — Ambulatory Visit (INDEPENDENT_AMBULATORY_CARE_PROVIDER_SITE_OTHER): Payer: Medicaid Other

## 2016-03-05 DIAGNOSIS — Z95810 Presence of automatic (implantable) cardiac defibrillator: Secondary | ICD-10-CM

## 2016-03-05 DIAGNOSIS — I5022 Chronic systolic (congestive) heart failure: Secondary | ICD-10-CM

## 2016-03-05 DIAGNOSIS — I509 Heart failure, unspecified: Secondary | ICD-10-CM

## 2016-03-05 NOTE — Progress Notes (Signed)
EPIC Encounter for ICM Monitoring  Patient Name: [name] is a 61 y.o. female Date: 03/05/2016 Primary Care Physican: Rosita Fire, MD Primary Cardiologist:Mc[place] Electrophysiologist: Allred Dry Weight:unknown Bi-V Pacing: >99%      Heart Failure questions reviewed, pt asymptomatic.  She was hospitalized in January for UTI and Hgb 5.  She is feeling better now.    Thoracic impedance normal   Labs: 01/26/2016 Creatinine 2.40, BUN 37, Potassium 3.9, Sodium 136 01/25/2016 Creatinine 1.92, BUN 27, Potassium 4.0, Sodium 138, EGFR 37-32 01/20/2017 Creatinine 1.72, BUN 52, Potassium 4.5, Sodium 140, EGFR 31-36 01/20/2016 Creatinine 1.95, BUN 32, Potassium 4.8, Sodium 138, EGFR 27-31 10//25/2017 Creatinine 1.80, BUN 29, Potassium 4.9, Sodium 141, EGFR 29-34 10/31/2015 Creatinine 1.62, BUN 27, Potassium 4.5, Sodium 138, EGFR 33-39  10/18/2015 Creatinine 2.40, BUN 34, Potassium 4.8, Sodium 138, EGFR 21-24  09/28/2015 Creatinine 2.43, BUN 41, Potassium 4.6, Sodium 137, EGFR 21-24  09/18/2015 Creatinine 2.05, BUN 29, Potassium 4.3, Sodium 141, EGFR 25-29  07/18/2015 Creatinine 1.96, BUN 22, Potassium 4.4, Sodium 141, EGFR 27-31  03/29/2015 Creatinine 2.28, BUN 35, Potassium 3.4, Sodium 141, EGFR 22-26  02/28/2015 Creatinine 5.09, BUN 43, Potassium 3.8, Sodium 144, EGFR 8-10  02/24/2015 Creatinine 5.48, BUN 59, Potassium 5.6, Sodium 145, EGFR 8-9  02/23/2015 Creatinine 5.56, BUN 60, Potassium 4.4, Sodium 143, EGFR 8-9 02/22/2015 Creatinine 5.44, BUN 60, Potassium 4.0, Sodium 146, EGFR 8-9  02/21/2015 Creatinine 5.59, BUN 61, Potassium 3.1, Sodium 140, EGFR 7-9  02/20/2015 Creatinine 5.57, BUN 58, Potassium 4.3, Sodium 139, EGFR 8-9   Recommendations: No changes. Reminded to limit dietary salt intake to 2000 mg/day and fluid intake to < 2 liters/day. Encouraged to call for fluid symptoms.  Follow-up plan: ICM clinic phone appointment on 04/18/2016.  HF appointment on 03/08/2016 and Dr  Rayann Heman 03/18/2016  Copy of ICM check sent to device physician.   3 month ICM trend: 03/05/2016   1 Year ICM trend:      Rosalene Billings, RN 03/05/2016 11:34 AM

## 2016-03-07 ENCOUNTER — Ambulatory Visit (HOSPITAL_COMMUNITY): Payer: Medicaid Other | Attending: Cardiovascular Disease

## 2016-03-07 ENCOUNTER — Other Ambulatory Visit: Payer: Self-pay

## 2016-03-07 DIAGNOSIS — D649 Anemia, unspecified: Secondary | ICD-10-CM | POA: Insufficient documentation

## 2016-03-07 DIAGNOSIS — I2510 Atherosclerotic heart disease of native coronary artery without angina pectoris: Secondary | ICD-10-CM | POA: Diagnosis not present

## 2016-03-07 DIAGNOSIS — I255 Ischemic cardiomyopathy: Secondary | ICD-10-CM | POA: Diagnosis not present

## 2016-03-07 DIAGNOSIS — N186 End stage renal disease: Secondary | ICD-10-CM | POA: Diagnosis not present

## 2016-03-07 DIAGNOSIS — I4891 Unspecified atrial fibrillation: Secondary | ICD-10-CM | POA: Insufficient documentation

## 2016-03-07 DIAGNOSIS — I252 Old myocardial infarction: Secondary | ICD-10-CM | POA: Insufficient documentation

## 2016-03-07 DIAGNOSIS — F17200 Nicotine dependence, unspecified, uncomplicated: Secondary | ICD-10-CM | POA: Diagnosis not present

## 2016-03-07 DIAGNOSIS — I5022 Chronic systolic (congestive) heart failure: Secondary | ICD-10-CM

## 2016-03-07 DIAGNOSIS — I132 Hypertensive heart and chronic kidney disease with heart failure and with stage 5 chronic kidney disease, or end stage renal disease: Secondary | ICD-10-CM | POA: Diagnosis not present

## 2016-03-11 ENCOUNTER — Ambulatory Visit (HOSPITAL_COMMUNITY)
Admission: RE | Admit: 2016-03-11 | Discharge: 2016-03-11 | Disposition: A | Discharge status: Home / Self Care | Payer: Medicaid Other | Source: Ambulatory Visit | Attending: Cardiology | Admitting: Cardiology

## 2016-03-11 VITALS — BP 110/56 | HR 63 | Wt 121.8 lb

## 2016-03-11 DIAGNOSIS — I480 Paroxysmal atrial fibrillation: Secondary | ICD-10-CM

## 2016-03-11 DIAGNOSIS — Z7901 Long term (current) use of anticoagulants: Secondary | ICD-10-CM | POA: Diagnosis not present

## 2016-03-11 DIAGNOSIS — I5022 Chronic systolic (congestive) heart failure: Secondary | ICD-10-CM | POA: Diagnosis present

## 2016-03-11 DIAGNOSIS — I255 Ischemic cardiomyopathy: Secondary | ICD-10-CM | POA: Diagnosis not present

## 2016-03-11 DIAGNOSIS — K5790 Diverticulosis of intestine, part unspecified, without perforation or abscess without bleeding: Secondary | ICD-10-CM | POA: Diagnosis not present

## 2016-03-11 DIAGNOSIS — I130 Hypertensive heart and chronic kidney disease with heart failure and stage 1 through stage 4 chronic kidney disease, or unspecified chronic kidney disease: Secondary | ICD-10-CM | POA: Diagnosis not present

## 2016-03-11 DIAGNOSIS — Z66 Do not resuscitate: Secondary | ICD-10-CM | POA: Diagnosis not present

## 2016-03-11 DIAGNOSIS — I2510 Atherosclerotic heart disease of native coronary artery without angina pectoris: Secondary | ICD-10-CM | POA: Diagnosis not present

## 2016-03-11 DIAGNOSIS — Z86010 Personal history of colonic polyps: Secondary | ICD-10-CM | POA: Diagnosis not present

## 2016-03-11 DIAGNOSIS — F419 Anxiety disorder, unspecified: Secondary | ICD-10-CM | POA: Insufficient documentation

## 2016-03-11 DIAGNOSIS — E785 Hyperlipidemia, unspecified: Secondary | ICD-10-CM | POA: Diagnosis not present

## 2016-03-11 DIAGNOSIS — K219 Gastro-esophageal reflux disease without esophagitis: Secondary | ICD-10-CM | POA: Insufficient documentation

## 2016-03-11 DIAGNOSIS — F329 Major depressive disorder, single episode, unspecified: Secondary | ICD-10-CM | POA: Diagnosis not present

## 2016-03-11 DIAGNOSIS — Z95810 Presence of automatic (implantable) cardiac defibrillator: Secondary | ICD-10-CM | POA: Diagnosis not present

## 2016-03-11 DIAGNOSIS — Z8673 Personal history of transient ischemic attack (TIA), and cerebral infarction without residual deficits: Secondary | ICD-10-CM | POA: Diagnosis not present

## 2016-03-11 DIAGNOSIS — I4891 Unspecified atrial fibrillation: Secondary | ICD-10-CM | POA: Diagnosis not present

## 2016-03-11 DIAGNOSIS — D649 Anemia, unspecified: Secondary | ICD-10-CM | POA: Diagnosis not present

## 2016-03-11 DIAGNOSIS — N183 Chronic kidney disease, stage 3 (moderate): Secondary | ICD-10-CM | POA: Insufficient documentation

## 2016-03-11 DIAGNOSIS — E039 Hypothyroidism, unspecified: Secondary | ICD-10-CM | POA: Insufficient documentation

## 2016-03-11 DIAGNOSIS — F17210 Nicotine dependence, cigarettes, uncomplicated: Secondary | ICD-10-CM | POA: Insufficient documentation

## 2016-03-11 DIAGNOSIS — I481 Persistent atrial fibrillation: Secondary | ICD-10-CM | POA: Insufficient documentation

## 2016-03-11 LAB — LIPID PANEL
CHOLESTEROL: 141 mg/dL (ref 0–200)
HDL: 43 mg/dL (ref >40)
LDL CALC: 81 mg/dL (ref 0–99)
TRIGLYCERIDES: 86 mg/dL (ref <150)
Total CHOL/HDL Ratio: 3.3 RATIO
VLDL: 17 mg/dL (ref 0–40)

## 2016-03-11 LAB — BASIC METABOLIC PANEL
Anion gap: 8 (ref 5–15)
BUN: 26 mg/dL — ABNORMAL HIGH (ref 6–20)
CALCIUM: 9.6 mg/dL (ref 8.9–10.3)
CO2: 25 mmol/L (ref 22–32)
CREATININE: 1.9 mg/dL — AB (ref 0.44–1.00)
Chloride: 109 mmol/L (ref 101–111)
GFR, EST AFRICAN AMERICAN: 32 mL/min — AB (ref >60)
GFR, EST NON AFRICAN AMERICAN: 27 mL/min — AB (ref >60)
Glucose, Bld: 94 mg/dL (ref 65–99)
Potassium: 5.6 mmol/L — ABNORMAL HIGH (ref 3.5–5.1)
SODIUM: 142 mmol/L (ref 135–145)

## 2016-03-11 MED ORDER — SPIRONOLACTONE 25 MG PO TABS: 12.5000 mg | ORAL_TABLET | Freq: Every day | ORAL | 3 refills | Status: DC

## 2016-03-11 MED ORDER — APIXABAN 5 MG PO TABS
5.0000 mg | ORAL_TABLET | Freq: Two times a day (BID) | ORAL | 6 refills | Status: DC
Start: 2016-03-14 — End: 2016-03-12

## 2016-03-11 NOTE — Progress Notes (Signed)
Patient ID: Laelani Daughtridge, female   DOB: 1955/12/12, 61 y.o.   MRN: [id number]    Advanced Heart Failure Clinic Note   PCP: Dr. Legrand Rams  Cardiologist: Dr. Aundra Dubin  HPI: Ms Elsberry is a 61 y/o female with a history of ETOH abuse, ICM, HTN, stroke and chronic systolic heart failure.  Admitted to [facility] 05/06/13 for pancreatitis. She had severely reduced systolic function with an EF of 20-25%. 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 Discharge weight was 140 pounds.   Admitted to [facility] 05/2013 with A fib RVR. Started on amiodarone 400 mg twice a day and Xarelto 20 mg daily. Chemically converted to NSR.  Admitted to [facility]  08/2013 with chest pain. Troponin was elevated. LHC was unchanged from April 2015 so she had CMRI that showed LAD territory was not viable, EF 28%. Plan to continue to treat medically.   Admitted 9/13 through 10/13/13 after attempted suicide. Overdosed on all HF medications. Discharged off carvedilol, lasix, lisinopril, xarelto and digoxin. Discharge weight 120 pounds. Discharged to [facility] which is a group home.  She has been doing much better since that time and is on sertraline.  Had colonoscopy on 03/29/14 with 8 polyps and diverticulosis. She developed increased dyspnea and had LHC/RHC in 3/16 showing mean RA 2, PA 45/21 (mean 34), PCWP mean 21, CI 2.77; LAD totally occluded with some collaterals from RCA, 40-50% OM1, 90% complex ostial PLV. No significant change on coronary angiography.  I thought that her dyspnea may have been due to worsened anemia.   In 8/16, she had St Jude ICD implanted.   Admitted to [facility] 1/24 through 02/24/2015. Treated for sepsis secondary to [facility]. On admit creatinine was 7.7. Entresto and spironolactone stopped. Due to anemia she was transfused 2UPRBCs. Blood loss thought to be from gastritis and duodenitis as well chronic anticoagulants. Eliquis was stopped, now on  warfarin. Discharged with nephrology and gastrology.  Discharge weight was 134 pounds.   On 10/30/15 had St Jude CRT-D upgrade.   She was admitted 12/17 with supratherapeutic INR and GI bleeding.  EGD showed gastritis.    She returns for HF followup. Continues to live at [facility]. They help her with her medicines. Continues to smoke 3-4 cigarettes/day. Weight stable.  Feels a lot better after CRT-D.  Denies SOB. Can walk as far as she wants on flat ground. Has mild DOE with inclines. No orthopnea or bendopnea. No PND.  No palpitations.  No chest pain.   Echo in 2/18 with EF 25-30%.   Corevue: volume overload a couple of weeks ago, now appears back to baseline.   ECHO 09/02/13 EF 25-30%  ECHO 03/2014: EF 25-30%  ECHO 02/15/2015: EF 25-30%.  ECHO 2/18: EF 25-30%, moderately decreased RV systolic function.   Labs 05/15/13 K 3.7 Creatinine 0.97 Labs 06/02/13 K 3.9 Creatinine 1.0 Dig level 1.3 --> cut back dig to 0.0625 mg Labs 06/08/13 K 4.7 Creatinine 1.1 Dig level 0.4  Labs 06/19/13: K 4.1, creatinine 1.35 Labs 08/05/13: K 4.5, creatinine 1.17, AST 25, ALT 28, cholesterol 188, TG 185, HDL 41, LDL 110 Labs 08/17/13: K 4.9 Creatinine 2.0  Labs 09/06/13: Dig level 0.9 K 4.5 Creatinine 1.13 Labs 10/13/13 K 5.1 Creatinine 2.1 Labs 10/20/13 K 4.1 Creatinine 1.32 Labs 01/27/2014: Hgb 9.9, LDL 108  Labs 3/16: K 4.4, creatinine 1.08, hgb 8.6 Labs 9/16: K 3.6, creatinine 1.31,  HCT 34.8, TnI 0.07 max.  Labs 10/16: K 4.5, creatinine 1.27 => 1.34, LDL 111, HDL 36 Labs 12/16: HCT 39.9 Labs 2/17: K 3.8, creatinine 5.09 Labs 3/17: LDL 75, HDL 34 Labs 6/17: K 4.4, creatinine 1.96 Labs 8/17: hgb 9.8 Labs 10/17: K 4.5, creatinine 1.62 Labs 1/18: K 4, creatinine 1.92, hgb 10.2   SH: Lives in a group home. Unemployed. Smoker . Does not drink  alcohol   FH: Father MI at the age of 47  ROS: All systems negative except as listed in HPI, PMH and Problem List.  Past Medical History:    Diagnosis Date  . Anemia   . Anxiety 2015   . CAD (coronary artery disease)    a. LHC (04/2013): Chronically occluded LAD, moderate dz in large OM1 and severe dz and small posterior lateral vessel    . chronic systolic heart failure    a. ECHO (05/08/13): EF 20-25%, diff HK with akinesis of basal inferior wall, grade 1 DD, trivial MR, trivial TR b) RHC (05/06/13): RA 7, RV 30/2, PA 31/19 (24), PCWP 10, Fick CO/CI: 2.9/1.74, Thermo CO/Ci: 2.4/1.4, PA sat 53%  . Depression   . Diverticulosis   . DNR (do not resuscitate) 06/05/2015   Confirmed with patient on 06/05/2015  . ETOH abuse 1981   started abusing alcohol at age 79, quit   . GERD (gastroesophageal reflux disease) 2015   . History of blood transfusion    "related to losing blood from somewhere; never found out from where"  . Hyperlipidemia 2015  . Hypertension 2015  . Hypothyroidism   . Ischemic cardiomyopathy   . Mass of right breast on mammogram 07/14/2014  . Pancreatitis 1980, 2015   in the setting of ETOH  . Persistent atrial fibrillation ([place]) 2015  . Psoriasis 1968  . Stroke [facility]) 2011   left side weakness, minimal  . Suicidal overdose ([place]) 10/03/13   Overdoes on Beta Blockers & Xarelto [facility] ED)    Current Outpatient Prescriptions  Medication Sig Dispense Refill  . acetaminophen (TYLENOL) 325 MG tablet Take 650 mg by mouth every 6 (six) hours as needed for mild pain.    Marland Kitchen ammonium lactate (AMLACTIN) 12 % cream Apply 1 g topically daily as needed for dry skin (apply to elbows).     Marland Kitchen atorvastatin (LIPITOR) 80 MG tablet TAKE 1 TABLET BY MOUTH ONCE DAILY. 30 tablet 3  . calcium carbonate (OS-CAL - DOSED IN MG OF ELEMENTAL CALCIUM) 1250 (500 Ca) MG tablet Take 1 tablet (500 mg of elemental calcium total) by mouth 3 (three) times daily with meals. 90 tablet 3  . carvedilol (COREG) 12.5 MG tablet TAKE 1 TABLET BY MOUTH TWICE DAILY. 60 tablet 3  . clonazePAM (KLONOPIN) 0.5 MG tablet Take 0.5 mg by mouth 2 (two) times daily.     Marland Kitchen ezetimibe (ZETIA) 10 MG tablet TAKE 1 TABLET BY MOUTH ONCE DAILY. 30 tablet 3  . levothyroxine (SYNTHROID, LEVOTHROID) 25 MCG tablet TAKE 1/2 TABLET BY MOUTH BEFORE BREAKFAST. 15 tablet 3  . lisinopril (PRINIVIL,ZESTRIL) 2.5 MG tablet Take 1 tablet (2.5 mg total) by mouth daily. D/C order for Hydralazine 30 tablet 6  . nitroGLYCERIN (NITROSTAT) 0.3 MG SL tablet Place 0.3 mg under the tongue every 5 (five) minutes as needed for chest pain (MAX 3 TABLETS). Reported on 07/18/2015    . ondansetron (ZOFRAN) 4 mg TABS tablet Take 4 tablets by mouth every 8 (eight) hours as needed. 4 tablet 0  . ondansetron (ZOFRAN)  8 MG tablet Take 1 tablet (8 mg total) by mouth every 8 (eight) hours as needed for nausea or vomiting. 20 tablet 0  . pantoprazole (PROTONIX) 40 MG tablet TAKE 1 TABLET BY MOUTH TWICE DAILY BEFORE A MEAL. 60 tablet 3  . potassium chloride SA (K-DUR,KLOR-CON) 20 MEQ tablet TAKE 1 TABLET BY MOUTH ONCE DAILY. 30 tablet 3  . sertraline (ZOLOFT) 50 MG tablet TAKE 2 TABLETS (=TO A 100MG  DOSE) BY MOUTH ONCE DAILY. 60 tablet 3  . torsemide (DEMADEX) 20 MG tablet Take 1 tablet (20 mg total) by mouth every other day. 15 tablet 2  . warfarin (COUMADIN) 2.5 MG tablet Take 2 tablets x 3 days (Thurs,Fri,Sat) then resume 1 tablet daily except 1 1/2 tablets on Tuesdays, Thursdays and Saturdays.  Recheck INR on 03/12/16 45 tablet 3  . [START ON 03/14/2016] apixaban (ELIQUIS) 5 MG TABS tablet Take 1 tablet (5 mg total) by mouth 2 (two) times daily. 60 tablet 6  . spironolactone (ALDACTONE) 25 MG tablet Take 0.5 tablets (12.5 mg total) by mouth daily. 90 tablet 3   No current facility-administered medications for this encounter.    BP (!) 110/56 (BP Location: Left Arm, Patient Position: Sitting, Cuff Size: Normal)   Pulse 63   Wt 121 lb 12.8 oz (55.2 kg)   LMP 01/22/1988 (Approximate)   SpO2 98%   BMI 21.58 kg/m   Wt Readings from Last 3 Encounters:  03/11/16 121 lb 12.8 oz (55.2 kg)  03/04/16 118 lb  12.8 oz (53.9 kg)  02/15/16 119 lb 11.2 oz (54.3 kg)     PHYSICAL EXAM: General: NAD HEENT: normal Neck: supple. JVP not elevated.  Carotids 2+ bilaterally; no bruits. No thyromegaly or nodule noted.  Cor: PMI normal. RRR. No M/G/R Lungs: Clear, normal effort Abdomen: soft, NT, ND, no HSM. No bruits or masses. +BS  Extremities: no cyanosis, clubbing, rash, edema Neuro: alert & orientedx3, cranial nerves grossly intact. Moves all 4 extremities w/o difficulty. Affect pleasant.  ASSESSMENT & PLAN: 1. Chronic systolic CHF: Primarily ischemic cardiomyopathy though ETOH may have played a role. EF 25-30% (08/2013).  Cardiac MRI in August 2015 showed no viability in the LAD territory with EF 28%. Most recent ECHO (01/2015) EF 25-30%. NYHA class II symptoms.  St Jude CRT-D, much improved symptomatically since upgrade to CRT. Volume status stable on exam.   - Off Entresto and spironolactone with AKI in 2/17.    - Continue lisinopril 2.5 mg daily.  - Continue coreg 12.5 mg BID - Continue torsemide 20 mg every other day.  - Restart spironolactone 12.5 daily.  Will get BMET today and again in 10 days.  2. CAD: LHC with chronically occluded LAD and severe disease in small PLV.  No change on 3/16 cath compared to prior. cMRI showed no viability in LAD territory.    - She is anticoagulated so no ASA. - Continue atorvastatin and Zetia, good lipids in 3/17.  3. Current smoker: Encouraged to quit smoking completely 4. Former Alcohol Abuse: Abstinent since April 2015.   5. Atrial fibrillation: CHADSVasc Score 4 . Paroxysmal. NSR today by exam.  - INR has been labile.  Will have her stop warfarin and start Eliquis 5 mg bid.  Will arrange this change through the coumadin clinic.     6. Suicide Attempt:  9/15 suicide attempt via overdose.  She is now on sertraline and doing much better.  7. Hyperlipidemia: Check lipids today.  8. GI bleed: In 12/17 in setting  of supratherapeutic INR.  EGD with gastritis.  Will  transition from warfarin to Eliquis given labile INRs.  9. CKD: Stage III.  BMET today.   Followup in 2 months.   [name] 03/11/2016

## 2016-03-11 NOTE — Progress Notes (Signed)
Patient requested to see CSW to assist with questions regarding Social Security/Medicare and housing options. CSW discussed social security benefits and explained medicare to patient. Patient also requested information on alternative housing options. Patient currently resides in an assisted living and states she " is not happy there." CSW discussed options and lengthy process for transfer. Patient verbalizes understanding and requests list of available options of AL and [facility] in [facility] per her request. Patient grateful for assistance and will follow up and return call to CSW if needed. Raquel Sarna, [place], [place]

## 2016-03-11 NOTE — Patient Instructions (Signed)
Start Spironolactone 12.5 mg (1/2 tab) daily  Stop coumadin and start Eliquis 5 mg Twice daily AS DIRECTED BY THE COUMADIN CLINIC  Labs today  Labs in 10 days  Your physician recommends that you schedule a follow-up appointment in: 2 months

## 2016-03-12 ENCOUNTER — Ambulatory Visit (INDEPENDENT_AMBULATORY_CARE_PROVIDER_SITE_OTHER): Payer: Medicaid Other | Admitting: *Deleted

## 2016-03-12 ENCOUNTER — Telehealth (HOSPITAL_COMMUNITY): Payer: Self-pay | Admitting: Cardiology

## 2016-03-12 DIAGNOSIS — I480 Paroxysmal atrial fibrillation: Secondary | ICD-10-CM | POA: Diagnosis not present

## 2016-03-12 DIAGNOSIS — I509 Heart failure, unspecified: Secondary | ICD-10-CM

## 2016-03-12 LAB — POCT INR: INR: 1.6

## 2016-03-12 MED ORDER — APIXABAN 2.5 MG PO TABS: 2.5000 mg | ORAL_TABLET | Freq: Two times a day (BID) | ORAL | 3 refills | Status: DC

## 2016-03-12 NOTE — Telephone Encounter (Signed)
-----   Message from Larey Dresser, MD sent at 03/11/2016 11:10 AM EST ----- Stop supplemental KCl.  Do not start spironolactone.  Low K diet.  Repeat BMET on Thursday.

## 2016-03-12 NOTE — Addendum Note (Signed)
Addended by: Kerry Dory on: 03/12/2016 09:49 AM   Modules accepted: Orders

## 2016-03-12 NOTE — Telephone Encounter (Signed)
Patient aware. Will have repeat labs done at [facility] on 2/22. Caregiver aware and will hold meds for the rest of the week, however meds changes will also need to be sent to care first as they set up pill packs for the patient. Fax [phone], phone [phone]   Verbal order given to Terri at [facility] regarding meds.

## 2016-03-14 ENCOUNTER — Encounter (HOSPITAL_COMMUNITY): Payer: Medicaid Other

## 2016-03-14 DIAGNOSIS — D509 Iron deficiency anemia, unspecified: Secondary | ICD-10-CM | POA: Diagnosis not present

## 2016-03-14 DIAGNOSIS — Z800 Family history of malignant neoplasm of digestive organs: Secondary | ICD-10-CM | POA: Diagnosis present

## 2016-03-14 LAB — CBC WITH DIFFERENTIAL/PLATELET
BASOS PCT: 1 %
Basophils Absolute: 0.1 10*3/uL (ref 0.0–0.1)
EOS ABS: 0.3 10*3/uL (ref 0.0–0.7)
EOS PCT: 4 %
HCT: 31.6 % — ABNORMAL LOW (ref 36.0–46.0)
Hemoglobin: 10.6 g/dL — ABNORMAL LOW (ref 12.0–15.0)
Lymphocytes Relative: 27 %
Lymphs Abs: 1.7 10*3/uL (ref 0.7–4.0)
MCH: 30.9 pg (ref 26.0–34.0)
MCHC: 33.5 g/dL (ref 30.0–36.0)
MCV: 92.1 fL (ref 78.0–100.0)
MONO ABS: 0.6 10*3/uL (ref 0.1–1.0)
MONOS PCT: 10 %
Neutro Abs: 3.6 10*3/uL (ref 1.7–7.7)
Neutrophils Relative %: 58 %
Platelets: 199 10*3/uL (ref 150–400)
RBC: 3.43 MIL/uL — ABNORMAL LOW (ref 3.87–5.11)
RDW: 13.4 % (ref 11.5–15.5)
WBC: 6.1 10*3/uL (ref 4.0–10.5)

## 2016-03-14 LAB — BASIC METABOLIC PANEL
Anion gap: 6 (ref 5–15)
BUN: 27 mg/dL — ABNORMAL HIGH (ref 6–20)
CALCIUM: 9.4 mg/dL (ref 8.9–10.3)
CO2: 28 mmol/L (ref 22–32)
CREATININE: 1.84 mg/dL — AB (ref 0.44–1.00)
Chloride: 106 mmol/L (ref 101–111)
GFR calc non Af Amer: 28 mL/min — ABNORMAL LOW (ref >60)
GFR, EST AFRICAN AMERICAN: 33 mL/min — AB (ref >60)
Glucose, Bld: 84 mg/dL (ref 65–99)
Potassium: 4.2 mmol/L (ref 3.5–5.1)
SODIUM: 140 mmol/L (ref 135–145)

## 2016-03-14 LAB — IRON AND TIBC
IRON: 77 ug/dL (ref 28–170)
Saturation Ratios: 24 % (ref 10.4–31.8)
TIBC: 319 ug/dL (ref 250–450)
UIBC: 242 ug/dL

## 2016-03-14 LAB — FERRITIN: Ferritin: 195 ng/mL (ref 11–307)

## 2016-03-18 ENCOUNTER — Encounter: Payer: Medicaid Other | Admitting: Internal Medicine

## 2016-03-20 ENCOUNTER — Encounter: Payer: Self-pay | Admitting: *Deleted

## 2016-03-22 ENCOUNTER — Other Ambulatory Visit (HOSPITAL_COMMUNITY)
Admission: RE | Admit: 2016-03-22 | Discharge: 2016-03-22 | Disposition: A | Discharge status: Home / Self Care | Payer: Medicaid Other | Source: Ambulatory Visit | Attending: Cardiology | Admitting: Cardiology

## 2016-03-22 ENCOUNTER — Other Ambulatory Visit: Payer: Self-pay | Admitting: Internal Medicine

## 2016-03-22 DIAGNOSIS — I5022 Chronic systolic (congestive) heart failure: Secondary | ICD-10-CM | POA: Diagnosis present

## 2016-03-22 LAB — BASIC METABOLIC PANEL
Anion gap: 6 (ref 5–15)
BUN: 22 mg/dL — AB (ref 6–20)
CALCIUM: 9.3 mg/dL (ref 8.9–10.3)
CO2: 28 mmol/L (ref 22–32)
CREATININE: 1.63 mg/dL — AB (ref 0.44–1.00)
Chloride: 109 mmol/L (ref 101–111)
GFR calc Af Amer: 38 mL/min — ABNORMAL LOW (ref >60)
GFR, EST NON AFRICAN AMERICAN: 33 mL/min — AB (ref >60)
GLUCOSE: 107 mg/dL — AB (ref 65–99)
POTASSIUM: 4.3 mmol/L (ref 3.5–5.1)
SODIUM: 143 mmol/L (ref 135–145)

## 2016-03-27 ENCOUNTER — Encounter: Payer: Medicaid Other | Admitting: Internal Medicine

## 2016-03-29 ENCOUNTER — Ambulatory Visit (INDEPENDENT_AMBULATORY_CARE_PROVIDER_SITE_OTHER): Payer: Medicaid Other | Admitting: Internal Medicine

## 2016-03-29 ENCOUNTER — Encounter: Payer: Self-pay | Admitting: Internal Medicine

## 2016-03-29 ENCOUNTER — Telehealth: Payer: Self-pay | Admitting: Internal Medicine

## 2016-03-29 VITALS — BP 117/70 | HR 62 | Ht 63.0 in | Wt 122.6 lb

## 2016-03-29 DIAGNOSIS — I480 Paroxysmal atrial fibrillation: Secondary | ICD-10-CM

## 2016-03-29 DIAGNOSIS — I5022 Chronic systolic (congestive) heart failure: Secondary | ICD-10-CM | POA: Diagnosis not present

## 2016-03-29 DIAGNOSIS — I255 Ischemic cardiomyopathy: Secondary | ICD-10-CM | POA: Diagnosis not present

## 2016-03-29 NOTE — Telephone Encounter (Signed)
Percert Verification for Echo scheduled for 06/13/16 at [facility]

## 2016-03-29 NOTE — Progress Notes (Signed)
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 female who presents today for routine electrophysiology followup.  Since her recent ICD upgrade, the patient reports doing very well.  She feels "much better" with CRT.  Energy and exercise tolerance are improved.  EF remains depressed however.  Today, she denies symptoms of palpitations, chest pain, shortness of breath,  lower extremity edema, dizziness,  or ICD shocks.  The patient is otherwise without complaint today.   Past Medical History:  Diagnosis Date  . Anemia   . Anxiety 2015   . CAD (coronary artery disease)    a. LHC (04/2013): Chronically occluded LAD, moderate dz in large OM1 and severe dz and small posterior lateral vessel    . chronic systolic heart failure    a. ECHO (05/08/13): EF 20-25%, diff HK with akinesis of basal inferior wall, grade 1 DD, trivial MR, trivial TR b) RHC (05/06/13): RA 7, RV 30/2, PA 31/19 (24), PCWP 10, Fick CO/CI: 2.9/1.74, Thermo CO/Ci: 2.4/1.4, PA sat 53%  . Depression   . Diverticulosis   . DNR (do not resuscitate) 06/05/2015   Confirmed with patient on 06/05/2015  . ETOH abuse 1981   started abusing alcohol at age 69, quit   . GERD (gastroesophageal reflux disease) 2015   . History of blood transfusion    "related to losing blood from somewhere; never found out from where"  . Hyperlipidemia 2015  . Hypertension 2015  . Hypothyroidism   . Ischemic cardiomyopathy   . Mass of right breast on mammogram 07/14/2014  . Pancreatitis 1980, 2015   in the setting of ETOH  . Persistent atrial fibrillation ([place]) 2015  . Psoriasis 1968  . Stroke [facility]) 2011   left side weakness, minimal  . Suicidal overdose ([place]) 10/03/13   Overdoes on Beta Blockers & Xarelto ([place])   Past Surgical History:  Procedure Laterality Date  . AGILE CAPSULE N/A 06/02/2014   Procedure: AGILE CAPSULE;  Surgeon: Danie Binder, MD;  Location: [facility] ENDO SU[facility];  Service: Endoscopy;  Laterality:  N/A;  0800  . BIOPSY N/A 11/22/2013   Procedure: GASTRIC BIOPSIES;  Surgeon: Danie Binder, MD;  Location: [facility] ORS;  Service: Endoscopy;  Laterality: N/A;  . COLONOSCOPY WITH PROPOFOL N/A 03/29/2014   SLF: 1. No definite source for anemia identified 2. 8 colon polyps removed 3. Severe diverticulosis noted the transverse colon  and right colon 4. Small internal hemorrohids.   . COLOSTOMY REVERSAL  ?2009  . EP IMPLANTABLE DEVICE N/A 09/13/2014   STJ single chamber ICD implanted by Dr Rayann Heman  . EP IMPLANTABLE DEVICE N/A 10/30/2015   STJ CRTD upgrade by Dr Rayann Heman  . ESOPHAGOGASTRODUODENOSCOPY (EGD) WITH PROPOFOL N/A 11/22/2013   Dr. Oneida Alar: gastritis and duodenitis, negative H.pylori  . ESOPHAGOGASTRODUODENOSCOPY (EGD) WITH PROPOFOL N/A 01/22/2016   Procedure: ESOPHAGOGASTRODUODENOSCOPY (EGD) WITH PROPOFOL;  Surgeon: Danie Binder, MD;  [facility];  Service: Endoscopy;  Laterality: N/A;  . GIVENS CAPSULE STUDY N/A 06/10/2014   Procedure: GIVENS CAPSULE STUDY;  Surgeon: Danie Binder, MD;  [facility];  Service: Endoscopy;  Laterality: N/A;  0700  . GIVENS CAPSULE STUDY N/A 01/22/2016   Procedure: GIVENS CAPSULE STUDY;  Surgeon: Danie Binder, MD;  [facility];  Service: Endoscopy;  Laterality: N/A;  . ILEOSTOMY  ?2009  . ILEOSTOMY CLOSURE  ?2010  . LEFT AND RIGHT HEART CATHETERIZATION WITH CORONARY ANGIOGRAM N/A 05/10/2013  Procedure: LEFT AND RIGHT HEART CATHETERIZATION WITH CORONARY ANGIOGRAM;  Surgeon: [name], MD;  Location: [facility] CATH LAB;  Service: Cardiovascular;  Laterality: N/A;  . LEFT AND RIGHT HEART CATHETERIZATION WITH CORONARY ANGIOGRAM N/A 04/14/2014   Procedure: LEFT AND RIGHT HEART CATHETERIZATION WITH CORONARY ANGIOGRAM;  Surgeon: Larey Dresser, MD;  Location: [facility] CATH LAB;  Service: Cardiovascular;  Laterality: N/A;  . LEFT HEART CATHETERIZATION WITH CORONARY ANGIOGRAM N/A 09/03/2013   Procedure: LEFT HEART CATHETERIZATION WITH CORONARY  ANGIOGRAM;  Surgeon: Sinclair Grooms, MD;  Location: [facility] CATH LAB;  Service: Cardiovascular;  Laterality: N/A;  . POLYPECTOMY N/A 03/29/2014   Procedure: POLYPECTOMY;  Surgeon: Danie Binder, MD;  Location: [facility] ORS;  Service: Endoscopy;  Laterality: N/A;  ascending colon, sigmoid colon x4, rectal x3    ROS- all systems are reviewed and negative except as per HPI above  Current Outpatient Prescriptions  Medication Sig Dispense Refill  . acetaminophen (TYLENOL) 325 MG tablet Take 650 mg by mouth every 6 (six) hours as needed for mild pain.    Marland Kitchen ammonium lactate (AMLACTIN) 12 % cream Apply 1 g topically daily as needed for dry skin (apply to elbows).     Marland Kitchen apixaban (ELIQUIS) 2.5 MG TABS tablet Take 1 tablet (2.5 mg total) by mouth 2 (two) times daily. 60 tablet 3  . atorvastatin (LIPITOR) 80 MG tablet TAKE 1 TABLET BY MOUTH ONCE DAILY. 30 tablet 3  . calcium carbonate (OS-CAL - DOSED IN MG OF ELEMENTAL CALCIUM) 1250 (500 Ca) MG tablet Take 1 tablet (500 mg of elemental calcium total) by mouth 3 (three) times daily with meals. 90 tablet 3  . carvedilol (COREG) 12.5 MG tablet TAKE 1 TABLET BY MOUTH TWICE DAILY. 60 tablet 3  . clonazePAM (KLONOPIN) 0.5 MG tablet Take 0.5 mg by mouth 2 (two) times daily.    Marland Kitchen ezetimibe (ZETIA) 10 MG tablet TAKE 1 TABLET BY MOUTH ONCE DAILY. 30 tablet 3  . levothyroxine (SYNTHROID, LEVOTHROID) 25 MCG tablet TAKE 1/2 TABLET BY MOUTH BEFORE BREAKFAST. 15 tablet 3  . nitroGLYCERIN (NITROSTAT) 0.3 MG SL tablet Place 0.3 mg under the tongue every 5 (five) minutes as needed for chest pain (MAX 3 TABLETS). Reported on 07/18/2015    . ondansetron (ZOFRAN) 4 mg TABS tablet Take 4 tablets by mouth every 8 (eight) hours as needed. 4 tablet 0  . ondansetron (ZOFRAN) 8 MG tablet Take 1 tablet (8 mg total) by mouth every 8 (eight) hours as needed for nausea or vomiting. 20 tablet 0  . pantoprazole (PROTONIX) 40 MG tablet TAKE 1 TABLET BY MOUTH TWICE DAILY BEFORE A MEAL. 60 tablet 3  .  sertraline (ZOLOFT) 50 MG tablet TAKE 2 TABLETS (=TO A 100MG  DOSE) BY MOUTH ONCE DAILY. 60 tablet 3  . spironolactone (ALDACTONE) 25 MG tablet Take 12.5 mg by mouth daily.    Marland Kitchen torsemide (DEMADEX) 20 MG tablet Take 1 tablet (20 mg total) by mouth every other day. 15 tablet 2  . lisinopril (PRINIVIL,ZESTRIL) 2.5 MG tablet Take 1 tablet (2.5 mg total) by mouth daily. D/C order for Hydralazine 30 tablet 6   No current facility-administered medications for this visit.     Physical Exam: Vitals:   03/29/16 1100  BP: 117/70  Pulse: 62  SpO2: 96%  Weight: 122 lb 9.6 oz (55.6 kg)  Height: 5\' 3"  (1.6 m)    GEN- The patient is well appearing, alert and oriented x 3 today.   Head- normocephalic, atraumatic  Eyes-  Sclera clear, conjunctiva pink Ears- hearing intact Oropharynx- clear Lungs- Clear to ausculation bilaterally, normal work of breathing Chest- ICD pocket is well healed Heart- Regular rate and rhythm, no murmurs, rubs or gallops, PMI not laterally displaced GI- soft, NT, ND, + BS Extremities- no clubbing, cyanosis, or edema  ICD interrogation- reviewed in detail today,  See PACEART report ekg 1/18 reviewed and reveals sinus with BiV pacing and very narrow QRS  Assessment and Plan:  1.  Chronic systolic dysfunction/ LBBB Improved with CRT.  As her EF remains depressed, I will turn multi point pacing on today.  Quick opt also performed and changes made. Follow-up with Chanetta Marshall in Optimization clinic in 3 months with repeat echo at that time.  If she does not feel better with MPP, would turn this feature off to preserve battery.  IF she responds then we will leave it on.   [name] MD, [facility] 03/29/2016 11:15 AM

## 2016-03-29 NOTE — Patient Instructions (Signed)
Medication Instructions:  Continue all current medications.  Labwork: none  Testing/Procedures:  Your physician has requested that you have an echocardiogram. Echocardiography is a painless test that uses sound waves to create images of your heart. It provides your doctor with information about the size and shape of your heart and how well your heart's chambers and valves are working. This procedure takes approximately one hour. There are no restrictions for this procedure. - just prior to next office visit.   Office will contact with results via phone or letter.    Follow-Up: 3 months   Any Other Special Instructions Will Be Listed Below (If Applicable). Remote monitoring is used to monitor your Pacemaker of ICD from home. This monitoring reduces the number of office visits required to check your device to one time per year. It allows [place] to keep an eye on the functioning of your device to ensure it is working properly. You are scheduled for a device check from home on 07/01/2016. You may send your transmission at any time that day. If you have a wireless device, the transmission will be sent automatically. After your physician reviews your transmission, you will receive a postcard with your next transmission date.  If you need a refill on your cardiac medications before your next appointment, please call your pharmacy.

## 2016-04-02 LAB — CUP PACEART INCLINIC DEVICE CHECK
Brady Statistic RV Percent Paced: 99.26 %
Date Time Interrogation Session: 20180309170149
HIGH POWER IMPEDANCE MEASURED VALUE: 76.5 Ohm
Implantable Lead Implant Date: 20160823
Implantable Lead Implant Date: 20171009
Implantable Lead Location: 753858
Implantable Lead Location: 753860
Lead Channel Impedance Value: 525 Ohm
Lead Channel Impedance Value: 750 Ohm
Lead Channel Impedance Value: 837.5 Ohm
Lead Channel Pacing Threshold Amplitude: 0.375 V
Lead Channel Pacing Threshold Amplitude: 1 V
Lead Channel Pacing Threshold Pulse Width: 0.5 ms
Lead Channel Pacing Threshold Pulse Width: 0.5 ms
Lead Channel Sensing Intrinsic Amplitude: 5 mV
Lead Channel Setting Pacing Amplitude: 1.375
Lead Channel Setting Sensing Sensitivity: 0.5 mV
MDC IDC LEAD IMPLANT DT: 20171009
MDC IDC LEAD LOCATION: 753859
MDC IDC MSMT LEADCHNL RV PACING THRESHOLD AMPLITUDE: 0.625 V
MDC IDC MSMT LEADCHNL RV PACING THRESHOLD PULSEWIDTH: 0.5 ms
MDC IDC MSMT LEADCHNL RV SENSING INTR AMPL: 12 mV
MDC IDC PG IMPLANT DT: 20171009
MDC IDC PG SERIAL: 7382721
MDC IDC SET LEADCHNL LV PACING AMPLITUDE: 2 V
MDC IDC SET LEADCHNL LV PACING PULSEWIDTH: 0.5 ms
MDC IDC SET LEADCHNL RV PACING AMPLITUDE: 2 V
MDC IDC SET LEADCHNL RV PACING PULSEWIDTH: 0.5 ms
MDC IDC STAT BRADY RA PERCENT PACED: 65 %

## 2016-04-11 ENCOUNTER — Encounter (HOSPITAL_COMMUNITY): Payer: Medicaid Other | Attending: Hematology & Oncology

## 2016-04-11 DIAGNOSIS — D509 Iron deficiency anemia, unspecified: Secondary | ICD-10-CM | POA: Diagnosis present

## 2016-04-11 DIAGNOSIS — Z800 Family history of malignant neoplasm of digestive organs: Secondary | ICD-10-CM | POA: Diagnosis present

## 2016-04-11 LAB — CBC WITH DIFFERENTIAL/PLATELET
BASOS PCT: 1 %
Basophils Absolute: 0.1 10*3/uL (ref 0.0–0.1)
Eosinophils Absolute: 0.2 10*3/uL (ref 0.0–0.7)
Eosinophils Relative: 3 %
HEMATOCRIT: 30.7 % — AB (ref 36.0–46.0)
Hemoglobin: 10.4 g/dL — ABNORMAL LOW (ref 12.0–15.0)
LYMPHS ABS: 1.4 10*3/uL (ref 0.7–4.0)
Lymphocytes Relative: 23 %
MCH: 31.2 pg (ref 26.0–34.0)
MCHC: 33.9 g/dL (ref 30.0–36.0)
MCV: 92.2 fL (ref 78.0–100.0)
MONOS PCT: 9 %
Monocytes Absolute: 0.5 10*3/uL (ref 0.1–1.0)
NEUTROS ABS: 3.8 10*3/uL (ref 1.7–7.7)
NEUTROS PCT: 64 %
Platelets: 239 10*3/uL (ref 150–400)
RBC: 3.33 MIL/uL — ABNORMAL LOW (ref 3.87–5.11)
RDW: 13.5 % (ref 11.5–15.5)
WBC: 5.9 10*3/uL (ref 4.0–10.5)

## 2016-04-11 LAB — IRON AND TIBC
Iron: 53 ug/dL (ref 28–170)
SATURATION RATIOS: 18 % (ref 10.4–31.8)
TIBC: 301 ug/dL (ref 250–450)
UIBC: 248 ug/dL

## 2016-04-11 LAB — BASIC METABOLIC PANEL
Anion gap: 8 (ref 5–15)
BUN: 29 mg/dL — ABNORMAL HIGH (ref 6–20)
CHLORIDE: 105 mmol/L (ref 101–111)
CO2: 26 mmol/L (ref 22–32)
CREATININE: 1.8 mg/dL — AB (ref 0.44–1.00)
Calcium: 9.9 mg/dL (ref 8.9–10.3)
GFR calc non Af Amer: 29 mL/min — ABNORMAL LOW (ref >60)
GFR, EST AFRICAN AMERICAN: 34 mL/min — AB (ref >60)
Glucose, Bld: 131 mg/dL — ABNORMAL HIGH (ref 65–99)
Potassium: 4.5 mmol/L (ref 3.5–5.1)
Sodium: 139 mmol/L (ref 135–145)

## 2016-04-11 LAB — FERRITIN: FERRITIN: 84 ng/mL (ref 11–307)

## 2016-04-12 ENCOUNTER — Other Ambulatory Visit (HOSPITAL_COMMUNITY): Payer: Self-pay | Admitting: Oncology

## 2016-04-16 ENCOUNTER — Ambulatory Visit (INDEPENDENT_AMBULATORY_CARE_PROVIDER_SITE_OTHER): Payer: Medicaid Other | Admitting: *Deleted

## 2016-04-16 DIAGNOSIS — I480 Paroxysmal atrial fibrillation: Secondary | ICD-10-CM

## 2016-04-16 NOTE — Progress Notes (Signed)
Pt was started on Eliquis 2.5mg  twice daily for atrial fibrillation on 03/14/16.  Warfarin was stopped due to labile INR readings.  Pt is tolerating Eliquis well.  She has not had any S/S of bleeding, excessive bruising or GI upset.  Pt had CBC and BMP drawn on 04/11/16.  Reviewed patients medication list.  Pt is not currently on any combined P-gp and strong CYP3A4 inhibitors/inducers (ketoconazole, traconazole, ritonavir, carbamazepine, phenytoin, rifampin, St. John's wort).  Reviewed labs from 04/11/16.  SCr 1.58, Weight 55.6kg, CrCl 28.81.  Dose is appropriate based on age, weight, and SCr.  Hgb and HCT: 10.4/30.7  She is scheduled for an iron transfusion 04/26/16.  A full discussion of the nature of anticoagulants has been carried out.  A benefit/risk analysis has been presented to the patient, so that they understand the justification for choosing anticoagulation with Eliquis at this time.  The need for compliance is stressed.  Pt is aware to take the medication twice daily.  Side effects of potential bleeding are discussed, including unusual colored urine or stools, coughing up blood or coffee ground emesis, nose bleeds or serious fall or head trauma.  Discussed signs and symptoms of stroke. The patient should avoid any OTC items containing aspirin or ibuprofen.  Avoid alcohol consumption.   Call if any signs of abnormal bleeding.  Discussed financial obligations and resolved any difficulty in obtaining medication.  Next lab test in 4 months.   Labs discussed and f/u appt made.

## 2016-04-17 ENCOUNTER — Telehealth (HOSPITAL_COMMUNITY): Payer: Self-pay | Admitting: Surgery

## 2016-04-17 ENCOUNTER — Other Ambulatory Visit: Payer: Medicaid Other

## 2016-04-17 NOTE — Telephone Encounter (Signed)
I spoke with Santiago Glad (Vickie's sister) and she tells me that [place] is concerned about her current living situation.  She is living at [facility] [place] in [place].  According to her sister she has been asked to move out and the staff is showing her room to other potential clients. She says Vickie feels "retaliated against".  I spoke with Raquel [facility] LCSW--she advises that they call [facility] [employer] and/or Ombudsman's office regarding this issue.  I have called Santiago Glad back to make her aware of this recommendation.

## 2016-04-18 ENCOUNTER — Ambulatory Visit (INDEPENDENT_AMBULATORY_CARE_PROVIDER_SITE_OTHER): Payer: Medicaid Other

## 2016-04-18 DIAGNOSIS — Z95810 Presence of automatic (implantable) cardiac defibrillator: Secondary | ICD-10-CM

## 2016-04-18 DIAGNOSIS — I5022 Chronic systolic (congestive) heart failure: Secondary | ICD-10-CM | POA: Diagnosis not present

## 2016-04-18 NOTE — Progress Notes (Signed)
EPIC Encounter for ICM Monitoring  Patient Name: [name] is a 61 y.o. female Date: 04/18/2016 Primary Care Physican: Rosita Fire, MD Primary Cardiologist:[place] Electrophysiologist: Allred Dry Weight:unknown Bi-V Pacing: >99%        Heart Failure questions reviewed, pt asymptomatic    Thoracic impedance normal .  Prescribed and confirmed dosage: Torsemide 20 mg 1 tablet every other day  Labs: 04/11/2016 Creatinine 1.80, BUN 29, Potassium 4.5, Sodium 139, EGFR 29-34 03/22/2016 Creatinein 1.63, BUN 22, Potassium 4.3, Sodium 143, EGFR 33-38 03/14/2016 Creatinine 1.84, BUN 27, Potassium 4.2, Sodium 140, EGFR 28-33  03/11/2016 Creatinine 1.90, BUN 26, Potassium 5.6, Sodium 142, EGFR 27-32  01/26/2016 Creatinine 2.40, BUN 37, Potassium 3.9, Sodium 136 01/25/2016 Creatinine 1.92, BUN 27, Potassium 4.0, Sodium 138, EGFR 37-32 01/20/2017 Creatinine 1.72, BUN 52, Potassium 4.5, Sodium 140, EGFR 31-36 01/20/2016 Creatinine 1.95, BUN 32, Potassium 4.8, Sodium 138, EGFR 27-31 10//25/2017 Creatinine 1.80, BUN 29, Potassium 4.9, Sodium 141, EGFR 29-34 10/31/2015 Creatinine 1.62, BUN 27, Potassium 4.5, Sodium 138, EGFR 33-39  10/18/2015 Creatinine 2.40, BUN 34, Potassium 4.8, Sodium 138, EGFR 21-24  09/28/2015 Creatinine 2.43, BUN 41, Potassium 4.6, Sodium 137, EGFR 21-24  09/18/2015 Creatinine 2.05, BUN 29, Potassium 4.3, Sodium 141, EGFR 25-29  07/18/2015 Creatinine 1.96, BUN 22, Potassium 4.4, Sodium 141, EGFR 27-31  03/29/2015 Creatinine 2.28, BUN 35, Potassium 3.4, Sodium 141, EGFR 22-26  02/28/2015 Creatinine 5.09, BUN 43, Potassium 3.8, Sodium 144, EGFR 8-10  02/24/2015 Creatinine 5.48, BUN 59, Potassium 5.6, Sodium 145, EGFR 8-9  02/23/2015 Creatinine 5.56, BUN 60, Potassium 4.4, Sodium 143, EGFR 8-9 02/22/2015 Creatinine 5.44, BUN 60, Potassium 4.0, Sodium 146, EGFR 8-9  02/21/2015 Creatinine 5.59, BUN 61, Potassium 3.1, Sodium 140, EGFR 7-9  02/20/2015 Creatinine 5.57, BUN  58, Potassium 4.3, Sodium 139, EGFR 8-9   Recommendations: No changes. Reminded to limit dietary salt intake to 2000 mg/day and fluid intake to < 2 liters/day. Encouraged to call for fluid symptoms.  Follow-up plan: ICM clinic phone appointment on 05/21/2016.  Copy of ICM check sent to device physician.   3 month ICM trend: 04/18/2016   1 Year ICM trend:      Rosalene Billings, RN 04/18/2016 10:38 AM

## 2016-04-23 ENCOUNTER — Telehealth: Payer: Self-pay | Admitting: Licensed Clinical Social Worker

## 2016-04-23 NOTE — Telephone Encounter (Signed)
CSW contacted patient's sister Santiago Glad to follow up on conversation from last week regarding patient's concerns at [facility]. Patient's sister spoke at length about concerns patient shared with her about over charging for pharmacy use, forcing patient out of the home without notice and concerns with lack of medical follow up for appointments. CSW provided supportive listening and encouraged sister to follow up with Ombudsman office for clarification on patient rights and how to proceed with assistance. Sister verbalizes understanding and will make call. CSW provided number for follow up. Santiago Glad stated she will call CSW if further needs arise and to keep posted on situation. CSW available if needed. Raquel Sarna, [place], [place]

## 2016-04-24 IMAGING — US US RENAL
1 series · 14 of 25 positions shown · non-contrast
Comparison: Abdominal ultrasound 02/03/2015

CLINICAL DATA: Acute kidney insufficiency

EXAM:
RENAL / URINARY TRACT ULTRASOUND COMPLETE

[Series 1: us renal · 0.22mm/px · 14 of 54 slices shown]
[im 1/54]
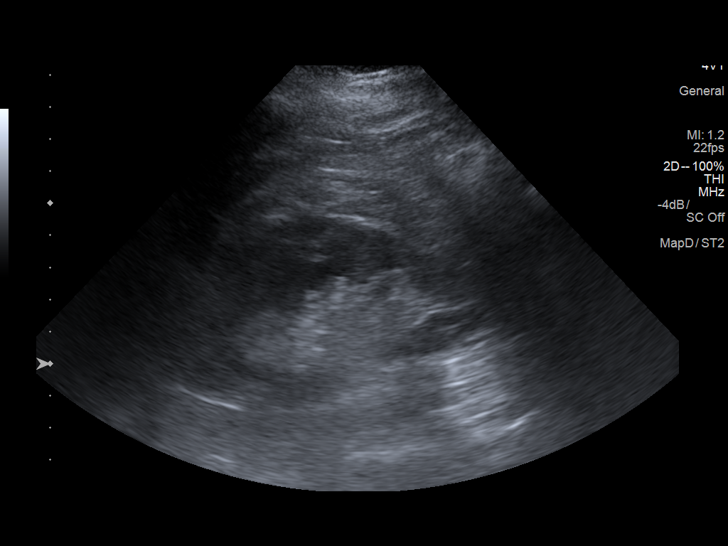
[im 5/54]
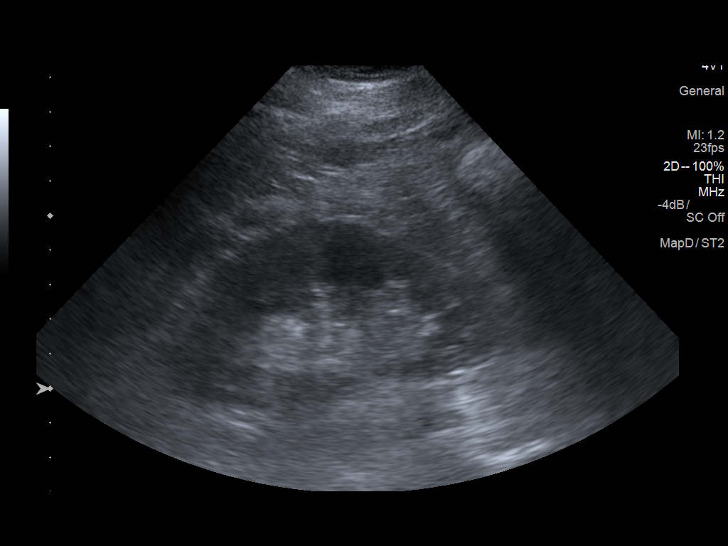
[im 9/54]
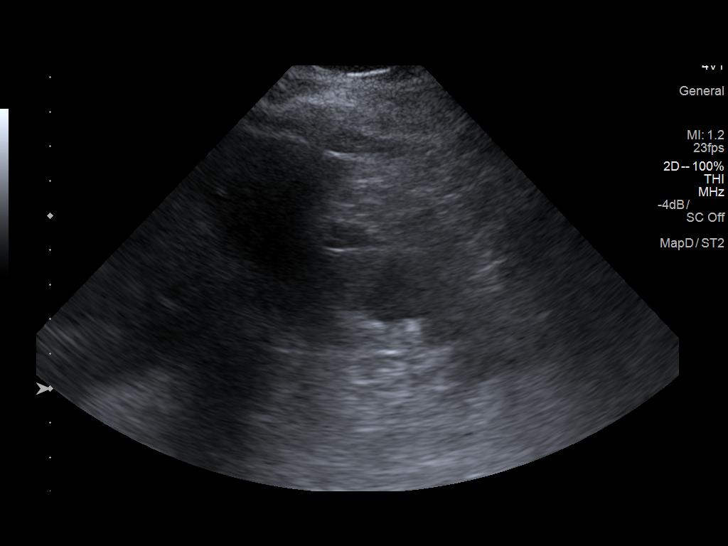
[im 14/54]
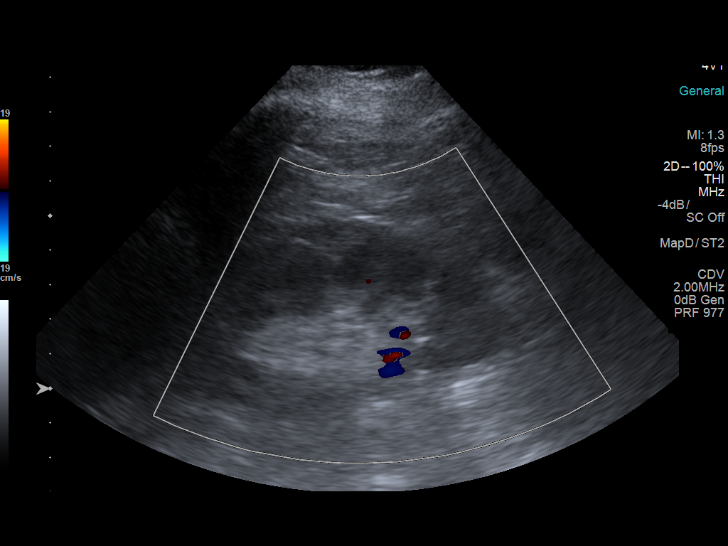
[im 18/54]
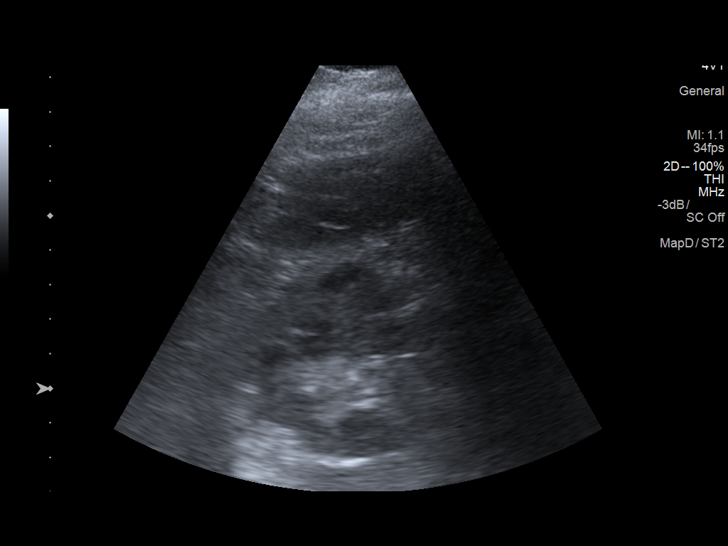
[im 20/54]
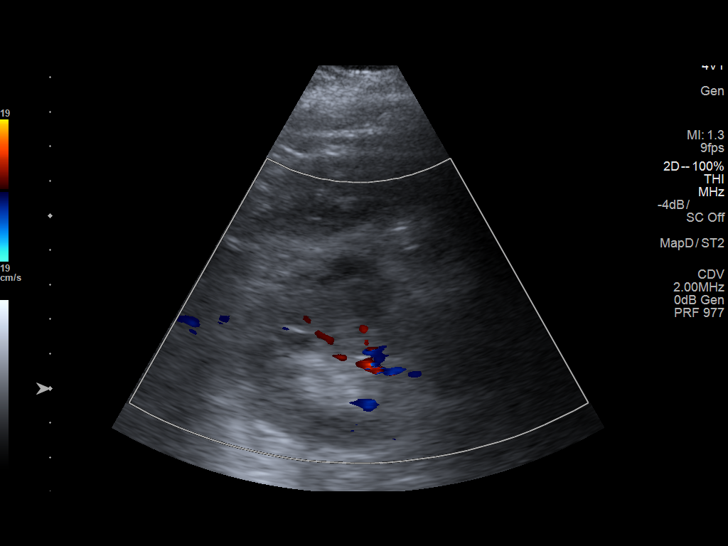
[im 25/54]
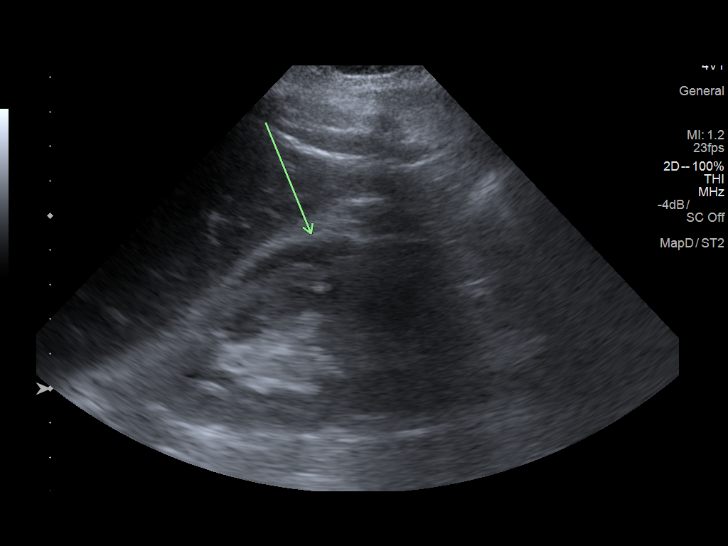
[im 29/54]
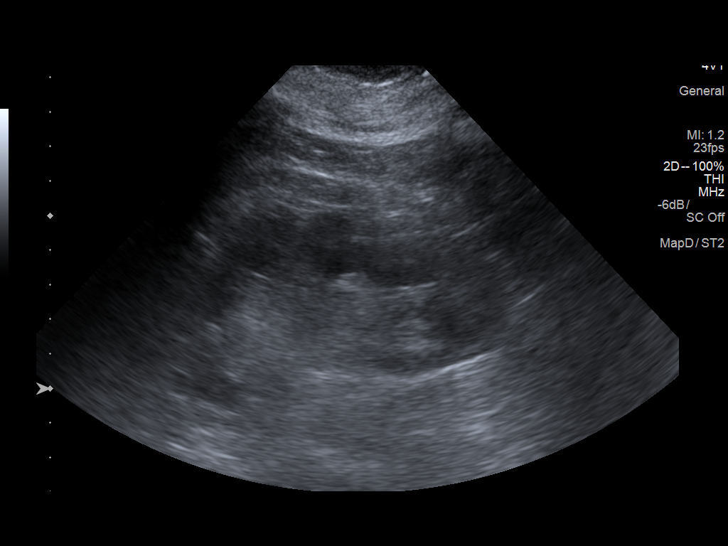
[im 34/54]
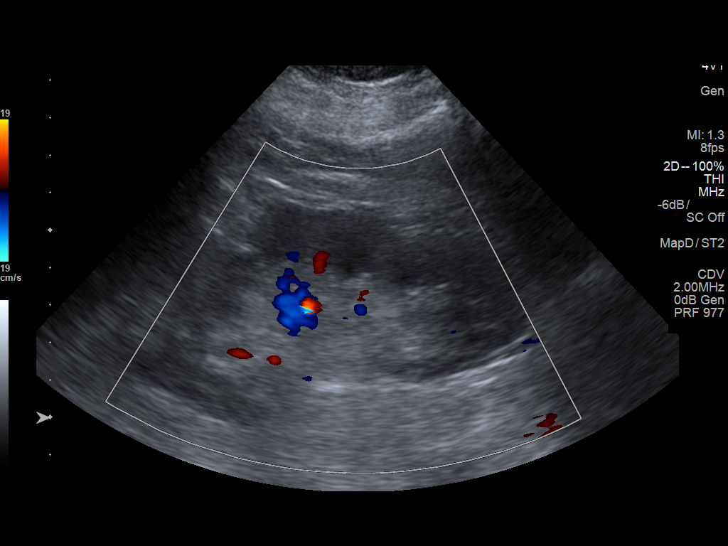
[im 36/54]
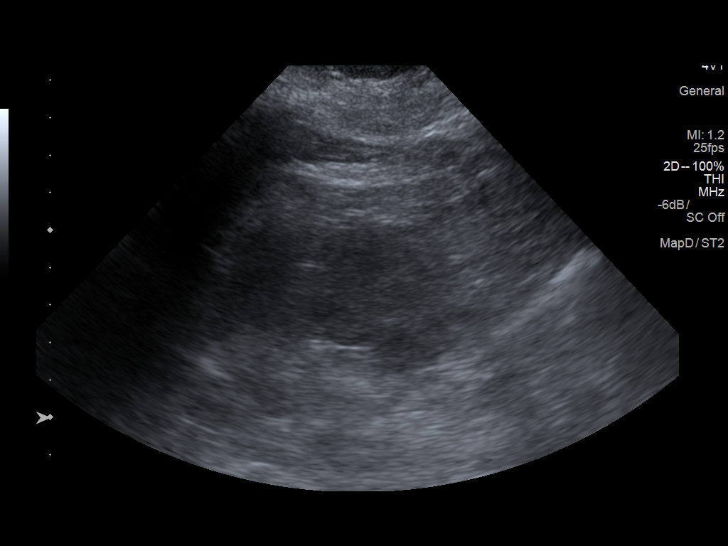
[im 40/54]
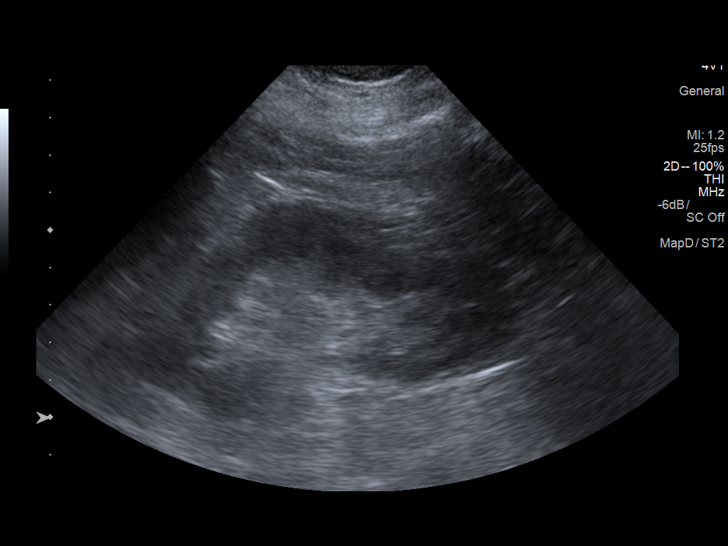
[im 45/54]
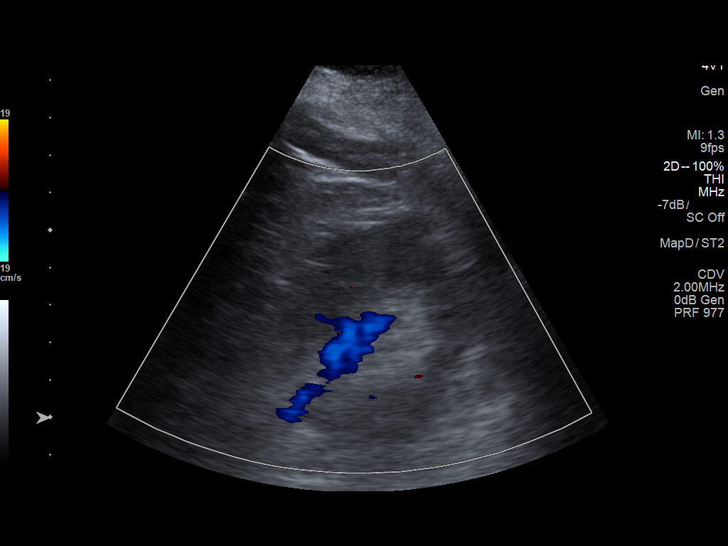
[im 49/54]
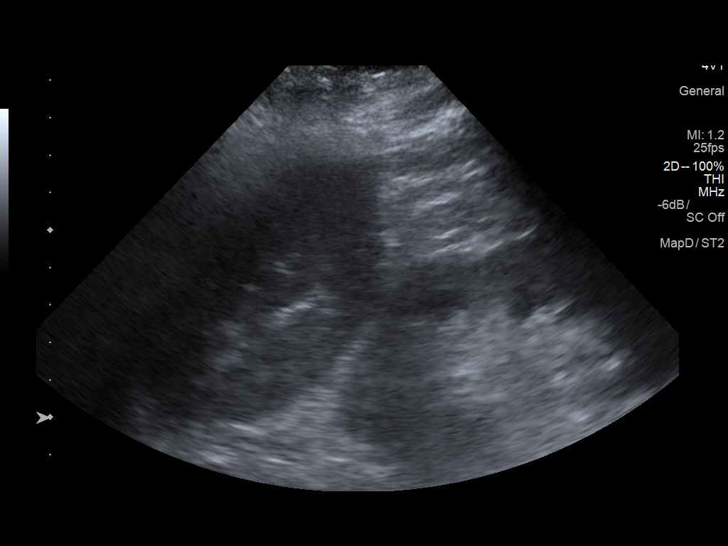
[im 54/54]
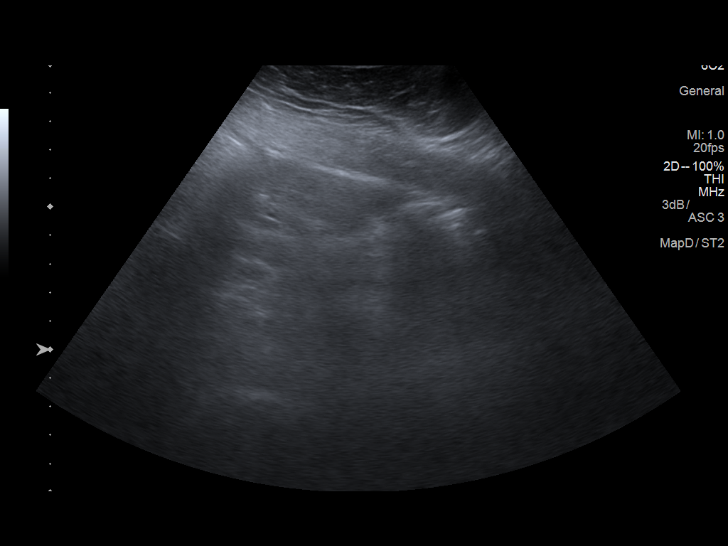

[14 of 25 positions shown; findings below may reference images not displayed]

FINDINGS: Right Kidney:

Length: 10.2 cm . There is normal echogenicity. No hydronephrosis.
No renal calculus. Trace perinephric fluid in midpole.

Left Kidney:

Length: 10.5 cm. Echogenicity within normal limits. No mass or
hydronephrosis visualized.

Bladder:

Decompressed urinary bladder with Foley catheter.
IMPRESSION: No hydronephrosis. No renal calculi. Trace perinephric fluid midpole
of the right kidney. Unremarkable urinary bladder.

## 2016-04-25 ENCOUNTER — Other Ambulatory Visit: Payer: Self-pay | Admitting: Cardiology

## 2016-04-26 ENCOUNTER — Encounter (HOSPITAL_COMMUNITY): Payer: Medicaid Other | Attending: Hematology & Oncology

## 2016-04-26 VITALS — BP 100/61 | HR 63 | Temp 98.4°F | Resp 18

## 2016-04-26 DIAGNOSIS — Z800 Family history of malignant neoplasm of digestive organs: Secondary | ICD-10-CM | POA: Insufficient documentation

## 2016-04-26 DIAGNOSIS — D509 Iron deficiency anemia, unspecified: Secondary | ICD-10-CM | POA: Insufficient documentation

## 2016-04-26 MED ORDER — SODIUM CHLORIDE 0.9 % IV SOLN
Freq: Once | INTRAVENOUS | Status: AC
  Administered 2016-04-26: 15:00:00 via INTRAVENOUS

## 2016-04-26 MED ORDER — SODIUM CHLORIDE 0.9 % IV SOLN
510.0000 mg | Freq: Once | INTRAVENOUS | Status: AC
  Administered 2016-04-26: 510 mg via INTRAVENOUS
  Filled 2016-04-26: qty 17

## 2016-04-26 NOTE — Progress Notes (Signed)
Tolerated iron infusion well. Stable and ambulatory on discharge home with caregiver.

## 2016-04-26 NOTE — Patient Instructions (Signed)
[place] at [facility] Discharge Instructions  RECOMMENDATIONS MADE BY THE CONSULTANT AND ANY TEST RESULTS WILL BE SENT TO YOUR REFERRING PHYSICIAN.  Fera[place] 510 mg iron infusion given today as ordered.  Thank you for choosing [place] at [facility] to provide your oncology and hematology care.  To afford each patient quality time with our provider, please arrive at least 15 minutes before your scheduled appointment time.    If you have a lab appointment with the [facility] please come in thru the  Main Entrance and check in at the main information desk  You need to re-schedule your appointment should you arrive 10 or more minutes late.  We strive to give you quality time with our providers, and arriving late affects you and other patients whose appointments are after yours.  Also, if you no show three or more times for appointments you may be dismissed from the clinic at the providers discretion.     Again, thank you for choosing [facility].  Our hope is that these requests will decrease the amount of time that you wait before being seen by our physicians.       _____________________________________________________________  Should you have questions after your visit to [facility], please contact our office at [phone] between the hours of 8:30 a.m. and 4:30 p.m.  Voicemails left after 4:30 p.m. will not be returned until the following business day.  For prescription refill requests, have your pharmacy contact our office.       Resources For Cancer Patients and their Caregivers ? American Cancer Society: Can assist with transportation, wigs, general needs, runs Look Good Feel Better.        2624683497 ? Cancer Care: Provides financial assistance, online support groups, medication/co-pay assistance.  1-800-813-HOPE 843 101 7557) ? [place] Assists [place] cancer patients  and their families through emotional , educational and financial support.  (951)261-1733 ? [place] DSS Where to apply for food stamps, Medicaid and utility assistance. 9393347927 ? [facility]: Transportation to medical appointments. 5416345231 ? Social Security Administration: May apply for disability if have a Stage IV cancer. 715-080-7798 725 593 8277 ? [employer], Disability and Transit Services: Assists with nutrition, care and transit needs. [place] Support Programs: @10RELATIVEDAYS @ > Cancer Support Group  2nd Tuesday of the month 1pm-2pm, Journey Room  > Creative Journey  3rd Tuesday of the month 1130am-1pm, Journey Room  > Look Good Feel Better  1st Wednesday of the month 10am-12 noon, Journey Room (Call [place] to register [phone])

## 2016-05-06 ENCOUNTER — Encounter: Payer: Medicaid Other | Admitting: Internal Medicine

## 2016-05-08 ENCOUNTER — Encounter (HOSPITAL_COMMUNITY): Payer: Self-pay

## 2016-05-08 ENCOUNTER — Ambulatory Visit (HOSPITAL_COMMUNITY)
Admission: RE | Admit: 2016-05-08 | Discharge: 2016-05-08 | Disposition: A | Discharge status: Home / Self Care | Payer: Medicaid Other | Source: Ambulatory Visit | Attending: Cardiology | Admitting: Cardiology

## 2016-05-08 VITALS — BP 110/60 | HR 83 | Wt 120.4 lb

## 2016-05-08 DIAGNOSIS — I255 Ischemic cardiomyopathy: Secondary | ICD-10-CM | POA: Diagnosis not present

## 2016-05-08 DIAGNOSIS — F17210 Nicotine dependence, cigarettes, uncomplicated: Secondary | ICD-10-CM | POA: Insufficient documentation

## 2016-05-08 DIAGNOSIS — I130 Hypertensive heart and chronic kidney disease with heart failure and stage 1 through stage 4 chronic kidney disease, or unspecified chronic kidney disease: Secondary | ICD-10-CM | POA: Insufficient documentation

## 2016-05-08 DIAGNOSIS — I10 Essential (primary) hypertension: Secondary | ICD-10-CM

## 2016-05-08 DIAGNOSIS — I5022 Chronic systolic (congestive) heart failure: Secondary | ICD-10-CM | POA: Insufficient documentation

## 2016-05-08 DIAGNOSIS — I480 Paroxysmal atrial fibrillation: Secondary | ICD-10-CM

## 2016-05-08 DIAGNOSIS — E039 Hypothyroidism, unspecified: Secondary | ICD-10-CM | POA: Diagnosis not present

## 2016-05-08 DIAGNOSIS — F329 Major depressive disorder, single episode, unspecified: Secondary | ICD-10-CM | POA: Insufficient documentation

## 2016-05-08 DIAGNOSIS — Z95810 Presence of automatic (implantable) cardiac defibrillator: Secondary | ICD-10-CM | POA: Diagnosis not present

## 2016-05-08 DIAGNOSIS — F419 Anxiety disorder, unspecified: Secondary | ICD-10-CM | POA: Diagnosis not present

## 2016-05-08 DIAGNOSIS — I2510 Atherosclerotic heart disease of native coronary artery without angina pectoris: Secondary | ICD-10-CM | POA: Diagnosis not present

## 2016-05-08 DIAGNOSIS — E785 Hyperlipidemia, unspecified: Secondary | ICD-10-CM | POA: Insufficient documentation

## 2016-05-08 DIAGNOSIS — N183 Chronic kidney disease, stage 3 unspecified: Secondary | ICD-10-CM

## 2016-05-08 DIAGNOSIS — R531 Weakness: Secondary | ICD-10-CM | POA: Insufficient documentation

## 2016-05-08 DIAGNOSIS — D649 Anemia, unspecified: Secondary | ICD-10-CM | POA: Insufficient documentation

## 2016-05-08 DIAGNOSIS — I481 Persistent atrial fibrillation: Secondary | ICD-10-CM | POA: Insufficient documentation

## 2016-05-08 DIAGNOSIS — Z7901 Long term (current) use of anticoagulants: Secondary | ICD-10-CM | POA: Diagnosis not present

## 2016-05-08 DIAGNOSIS — F1011 Alcohol abuse, in remission: Secondary | ICD-10-CM | POA: Insufficient documentation

## 2016-05-08 DIAGNOSIS — K219 Gastro-esophageal reflux disease without esophagitis: Secondary | ICD-10-CM | POA: Diagnosis not present

## 2016-05-08 DIAGNOSIS — Z8673 Personal history of transient ischemic attack (TIA), and cerebral infarction without residual deficits: Secondary | ICD-10-CM | POA: Insufficient documentation

## 2016-05-08 DIAGNOSIS — Z66 Do not resuscitate: Secondary | ICD-10-CM | POA: Diagnosis not present

## 2016-05-08 LAB — BASIC METABOLIC PANEL
ANION GAP: 9 (ref 5–15)
BUN: 35 mg/dL — ABNORMAL HIGH (ref 6–20)
CALCIUM: 9.2 mg/dL (ref 8.9–10.3)
CHLORIDE: 105 mmol/L (ref 101–111)
CO2: 27 mmol/L (ref 22–32)
Creatinine, Ser: 2.11 mg/dL — ABNORMAL HIGH (ref 0.44–1.00)
GFR calc non Af Amer: 24 mL/min — ABNORMAL LOW (ref >60)
GFR, EST AFRICAN AMERICAN: 28 mL/min — AB (ref >60)
GLUCOSE: 70 mg/dL (ref 65–99)
POTASSIUM: 4 mmol/L (ref 3.5–5.1)
Sodium: 141 mmol/L (ref 135–145)

## 2016-05-08 NOTE — Progress Notes (Signed)
Patient ID: [name], female   DOB: 1955/05/19, 61 y.o.   MRN: [id number]    Advanced Heart Failure Clinic Note   PCP: Dr. Legrand Rams  Cardiologist: Dr. Aundra Dubin  HPI: [name] is a 61 y/o female with a history of ETOH abuse, ICM, HTN, stroke and chronic systolic heart failure.  Admitted to [facility] 05/06/13 for pancreatitis. She had severely reduced systolic function with an EF of 20-25%. 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 Discharge weight was 140 pounds.   Admitted to [facility] 05/2013 with A fib RVR. Started on amiodarone 400 mg twice a day and Xarelto 20 mg daily. Chemically converted to NSR.  Admitted to [facility]  08/2013 with chest pain. Troponin was elevated. LHC was unchanged from April 2015 so she had CMRI that showed LAD territory was not viable, EF 28%. Plan to continue to treat medically.   Admitted 9/13 through 10/13/13 after attempted suicide. Overdosed on all HF medications. Discharged off carvedilol, lasix, lisinopril, xarelto and digoxin. Discharge weight 120 pounds. Discharged to [facility] which is a group home.  She has been doing much better since that time and is on sertraline.  Had colonoscopy on 03/29/14 with 8 polyps and diverticulosis. She developed increased dyspnea and had LHC/RHC in 3/16 showing mean RA 2, PA 45/21 (mean 34), PCWP mean 21, CI 2.77; LAD totally occluded with some collaterals from RCA, 40-50% OM1, 90% complex ostial PLV. No significant change on coronary angiography.  Previously thought that her dyspnea may have been due to worsened anemia.   In 8/16, she had St Jude ICD implanted.   Admitted to [facility] 1/24 through 02/24/2015. Treated for sepsis secondary to [facility]. On admit creatinine was 7.7. Entresto and spironolactone stopped. Due to anemia she was transfused 2UPRBCs. Blood loss thought to be from gastritis and duodenitis as well chronic anticoagulants. Eliquis was stopped,  now on warfarin. Discharged with nephrology and gastrology.  Discharge weight was 134 pounds.   On 10/30/15 had St Jude CRT-D upgrade.   She was admitted 12/17 with supratherapeutic INR and GI bleeding.  EGD showed gastritis.    Pt presents today for regular follow up. At last visit planned on starting spiro 12.5 mg daily but K came back as 5.6 on labs that day so this order was cancelled. Pt states she has been taking spironolactone 12.5 daily, she did not get message to have it stopped. Down 1 lb since last visit. Breathing has been good overall.  No SOB on flat ground. Mild SOB with stairs or inclines.  She states she occasionally has low BP at MD visits. Of note, GI office in February documented BP of 77/57. Does get lightheadedness with rapid standing, but resolves after a [name] seconds. No near syncope. No orthopnea, bendopnea, no CP or palpitations. Continues to smoke 3-4 cigarettes a day. One after each meal.   Corevue: No VT/VF. No Mode switch/AF. Thoracic impedence above threshold.   ECHO 09/02/13 EF 25-30%  ECHO 03/2014: EF 25-30%  ECHO 02/15/2015: EF 25-30%.  ECHO 2/18: EF 25-30%, moderately decreased RV systolic function.   Labs 1/18: K 4, creatinine 1.92, hgb 10.2   SH: Lives in a group home ([facility]). Unemployed. Smoker . Does not drink  alcohol   FH: Father MI at the age of 57  Review of systems complete and found to be negative unless listed in HPI.  Past Medical History:  Diagnosis Date  . Anemia   . Anxiety 2015   . CAD (coronary artery disease)    a. LHC (04/2013): Chronically occluded LAD, moderate dz in large OM1 and severe dz and small posterior lateral vessel    . chronic systolic heart failure    a. ECHO (05/08/13): EF 20-25%, diff HK with akinesis of basal inferior wall, grade 1 DD, trivial MR, trivial TR b) RHC (05/06/13): RA 7, RV 30/2, PA 31/19 (24), PCWP 10, Fick CO/CI: 2.9/1.74, Thermo CO/Ci: 2.4/1.4, PA sat 53%  . Depression   . Diverticulosis     . DNR (do not resuscitate) 06/05/2015   Confirmed with patient on 06/05/2015  . ETOH abuse 1981   started abusing alcohol at age 42, quit   . GERD (gastroesophageal reflux disease) 2015   . History of blood transfusion    "related to losing blood from somewhere; never found out from where"  . Hyperlipidemia 2015  . Hypertension 2015  . Hypothyroidism   . Ischemic cardiomyopathy   . Mass of right breast on mammogram 07/14/2014  . Pancreatitis 1980, 2015   in the setting of ETOH  . Persistent atrial fibrillation ([place]) 2015  . Psoriasis 1968  . Stroke [facility]) 2011   left side weakness, minimal  . Suicidal overdose ([place]) 10/03/13   Overdoes on Beta Blockers & Xarelto [facility])    Current Outpatient Prescriptions  Medication Sig Dispense Refill  . acetaminophen (TYLENOL) 325 MG tablet Take 650 mg by mouth every 6 (six) hours as needed for mild pain.    Marland Kitchen ammonium lactate (AMLACTIN) 12 % cream Apply 1 g topically daily as needed for dry skin (apply to elbows).     Marland Kitchen atorvastatin (LIPITOR) 80 MG tablet TAKE 1 TABLET BY MOUTH ONCE DAILY. 30 tablet 3  . calcium carbonate (OS-CAL - DOSED IN MG OF ELEMENTAL CALCIUM) 1250 (500 Ca) MG tablet Take 1 tablet (500 mg of elemental calcium total) by mouth 3 (three) times daily with meals. 90 tablet 3  . carvedilol (COREG) 12.5 MG tablet TAKE 1 TABLET BY MOUTH TWICE DAILY. 60 tablet 3  . clonazePAM (KLONOPIN) 0.5 MG tablet Take 0.5 mg by mouth 2 (two) times daily.    Marland Kitchen ELIQUIS 2.5 MG TABS tablet TAKE 1 TABLET BY MOUTH TWICE DAILY. 60 tablet 6  . ezetimibe (ZETIA) 10 MG tablet TAKE 1 TABLET BY MOUTH ONCE DAILY. 30 tablet 3  . levothyroxine (SYNTHROID, LEVOTHROID) 25 MCG tablet TAKE 1/2 TABLET BY MOUTH BEFORE BREAKFAST. 15 tablet 3  . lisinopril (PRINIVIL,ZESTRIL) 2.5 MG tablet Take 1 tablet (2.5 mg total) by mouth daily. D/C order for Hydralazine 30 tablet 6  . nitroGLYCERIN (NITROSTAT) 0.3 MG SL tablet Place 0.3 mg under the tongue every 5 (five) minutes  as needed for chest pain (MAX 3 TABLETS). Reported on 07/18/2015    . ondansetron (ZOFRAN) 4 mg TABS tablet Take 4 tablets by mouth every 8 (eight) hours as needed. 4 tablet 0  . pantoprazole (PROTONIX) 40 MG tablet TAKE 1 TABLET BY MOUTH TWICE DAILY BEFORE A MEAL. 60 tablet 3  . sertraline (ZOLOFT) 50 MG tablet TAKE 2 TABLETS (=TO A 100MG  DOSE) BY MOUTH ONCE DAILY. 60 tablet 3  . spironolactone (ALDACTONE) 25 MG tablet Take 12.5 mg by mouth daily.    Marland Kitchen torsemide (DEMADEX) 20 MG tablet Take 1 tablet (20 mg total) by mouth every other day. 15 tablet 2   No current facility-administered medications for this encounter.  BP 110/60 (BP Location: Left Arm, Patient Position: Sitting, Cuff Size: Normal)   Pulse 83   Wt 120 lb 6.4 oz (54.6 kg)   LMP 01/22/1988 (Approximate)   SpO2 96%   BMI 21.33 kg/m   Wt Readings from Last 3 Encounters:  05/08/16 120 lb 6.4 oz (54.6 kg)  03/29/16 122 lb 9.6 oz (55.6 kg)  03/11/16 121 lb 12.8 oz (55.2 kg)     PHYSICAL EXAM: General: Well appearing caucasian female in NAD.  HEENT: Normal.  Neck: Supple. JVP 6-7 cm. Carotids 2+ bilat; no bruits. No thyromegaly or nodule noted.  Cor: PMI normal. RRR. No M/G/R noted.  Lungs: CTAB, normal effort.  Abdomen: Soft, NT, ND, no HSM. No bruits or masses. +BS  Extremities: no cyanosis, clubbing, rash, edema  Neuro: Alert & oriented x 3. Cranial nerves grossly intact. Moves all 4 extremities w/o difficulty. Affect pleasant    ASSESSMENT & PLAN: 1. Chronic systolic CHF: Primarily ischemic cardiomyopathy though ETOH may have played a role. EF 25-30% (08/2013).  Cardiac MRI in August 2015 showed no viability in the LAD territory with EF 28%. Most recent ECHO (01/2015) EF 25-30%. - Stable overall. NYHA class II symptoms.  - Much improved symptomatically s/p St Jude CRT-D. - Volume status stable. - Off Entresto and spironolactone with AKI in 2/17.   Will not re-challenege with soft pressures and lightheadedness  - Continue  lisinopril 2.5 mg daily.  - Continue coreg 12.5 mg BID - Continue torsemide 20 mg every other day. Can take extra as needed.  - Pt state she is taking spironolactone 12.5 daily. Will check BMET today.  - Reinforced fluid restriction to < 2 L daily, sodium restriction to less than 2000 mg daily, and the importance of daily weights.   2. CAD: LHC with chronically occluded LAD and severe disease in small PLV.  No change on 3/16 cath compared to prior. cMRI showed no viability in LAD territory.    - She is anticoagulated so no ASA. - Continue atorvastatin and Zetia, good lipids in 3/17. Doing well. No change.  3. Current smoker:  - Needs to stop completely.  4. Former Alcohol Abuse: Abstinent since April 2015.  Congratulated and encouraged to continue.  5. Atrial fibrillation: CHADSVasc Score 4 . Paroxysmal.  - NSR by exam. No AF by ICD interrogation. - Tolerating eliquis 5 mg BID. No bleeding.  6. Suicide Attempt:  9/15 suicide attempt via overdose.  She is now on sertraline. Denies depression. No further suicide attempts.  7. Hyperlipidemia:  - Lipids stable 02/2016. Continue atorvastatin 80 mg daily.  8. GI bleed: In 12/17 in setting of supratherapeutic INR.  EGD with gastritis. - Stable on Eliquis.   9. CKD: Stage III - BMET today.   Doing well overall. Labs today. No med changes with labile  BP and lightheadedness   Shirley Friar, [place]  05/08/2016
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
## 2016-05-08 NOTE — Addendum Note (Signed)
Encounter addended by: Micki Riley, RN on: 05/08/2016 11:04 AM<BR>    Actions taken: Sign clinical note

## 2016-05-08 NOTE — Patient Instructions (Signed)
Lab today  We will contact you in 3 months to schedule your next appointment.  

## 2016-05-09 ENCOUNTER — Other Ambulatory Visit (HOSPITAL_COMMUNITY): Payer: Medicaid Other

## 2016-05-09 ENCOUNTER — Ambulatory Visit (HOSPITAL_COMMUNITY): Payer: Medicaid Other | Admitting: Oncology

## 2016-05-09 ENCOUNTER — Ambulatory Visit (HOSPITAL_COMMUNITY): Payer: Medicaid Other | Admitting: Adult Health

## 2016-05-15 ENCOUNTER — Encounter (HOSPITAL_BASED_OUTPATIENT_CLINIC_OR_DEPARTMENT_OTHER): Payer: Medicaid Other | Admitting: Adult Health

## 2016-05-15 ENCOUNTER — Other Ambulatory Visit (HOSPITAL_COMMUNITY): Payer: Self-pay | Admitting: Adult Health

## 2016-05-15 ENCOUNTER — Encounter (HOSPITAL_COMMUNITY): Payer: Medicaid Other

## 2016-05-15 ENCOUNTER — Encounter (HOSPITAL_COMMUNITY): Payer: Self-pay | Admitting: Adult Health

## 2016-05-15 VITALS — BP 75/51 | HR 81 | Temp 98.1°F | Resp 16 | Ht 63.0 in | Wt 119.0 lb

## 2016-05-15 DIAGNOSIS — N189 Chronic kidney disease, unspecified: Secondary | ICD-10-CM | POA: Diagnosis not present

## 2016-05-15 DIAGNOSIS — D509 Iron deficiency anemia, unspecified: Secondary | ICD-10-CM | POA: Diagnosis present

## 2016-05-15 DIAGNOSIS — Z800 Family history of malignant neoplasm of digestive organs: Secondary | ICD-10-CM | POA: Diagnosis present

## 2016-05-15 DIAGNOSIS — D649 Anemia, unspecified: Secondary | ICD-10-CM

## 2016-05-15 DIAGNOSIS — Z1231 Encounter for screening mammogram for malignant neoplasm of breast: Secondary | ICD-10-CM

## 2016-05-15 LAB — BASIC METABOLIC PANEL
ANION GAP: 7 (ref 5–15)
BUN: 30 mg/dL — AB (ref 6–20)
CHLORIDE: 102 mmol/L (ref 101–111)
CO2: 29 mmol/L (ref 22–32)
CREATININE: 1.84 mg/dL — AB (ref 0.44–1.00)
Calcium: 9.4 mg/dL (ref 8.9–10.3)
GFR calc non Af Amer: 28 mL/min — ABNORMAL LOW (ref >60)
GFR, EST AFRICAN AMERICAN: 33 mL/min — AB (ref >60)
Glucose, Bld: 86 mg/dL (ref 65–99)
POTASSIUM: 3.9 mmol/L (ref 3.5–5.1)
SODIUM: 138 mmol/L (ref 135–145)

## 2016-05-15 LAB — CBC WITH DIFFERENTIAL/PLATELET
BASOS PCT: 1 %
Basophils Absolute: 0.1 10*3/uL (ref 0.0–0.1)
EOS ABS: 0.2 10*3/uL (ref 0.0–0.7)
Eosinophils Relative: 3 %
HCT: 28.4 % — ABNORMAL LOW (ref 36.0–46.0)
HEMOGLOBIN: 9.6 g/dL — AB (ref 12.0–15.0)
Lymphocytes Relative: 25 %
Lymphs Abs: 1.4 10*3/uL (ref 0.7–4.0)
MCH: 31.2 pg (ref 26.0–34.0)
MCHC: 33.8 g/dL (ref 30.0–36.0)
MCV: 92.2 fL (ref 78.0–100.0)
Monocytes Absolute: 0.5 10*3/uL (ref 0.1–1.0)
Monocytes Relative: 9 %
NEUTROS ABS: 3.4 10*3/uL (ref 1.7–7.7)
NEUTROS PCT: 62 %
Platelets: 204 10*3/uL (ref 150–400)
RBC: 3.08 MIL/uL — AB (ref 3.87–5.11)
RDW: 13.1 % (ref 11.5–15.5)
WBC: 5.5 10*3/uL (ref 4.0–10.5)

## 2016-05-15 LAB — FERRITIN: FERRITIN: 234 ng/mL (ref 11–307)

## 2016-05-15 LAB — IRON AND TIBC
Iron: 69 ug/dL (ref 28–170)
Saturation Ratios: 24 % (ref 10.4–31.8)
TIBC: 287 ug/dL (ref 250–450)
UIBC: 218 ug/dL

## 2016-05-15 NOTE — Patient Instructions (Addendum)
[place] at [facility] Discharge Instructions  RECOMMENDATIONS MADE BY THE CONSULTANT AND ANY TEST RESULTS WILL BE SENT TO YOUR REFERRING PHYSICIAN.  You were seen today by Mike Craze NP. Return monthly for labs. Get your mammogram as soon as able. Return in 4 months for follow up  And labs. Please keep all cardiology and specialists appointments as scheduled. Go to the Emergency room with any bleeding or worsening shortness of breath.    Thank you for choosing [place] at [facility] to provide your oncology and hematology care.  To afford each patient quality time with our provider, please arrive at least 15 minutes before your scheduled appointment time.    If you have a lab appointment with the [facility] please come in thru the  Main Entrance and check in at the main information desk  You need to re-schedule your appointment should you arrive 10 or more minutes late.  We strive to give you quality time with our providers, and arriving late affects you and other patients whose appointments are after yours.  Also, if you no show three or more times for appointments you may be dismissed from the clinic at the providers discretion.     Again, thank you for choosing [facility].  Our hope is that these requests will decrease the amount of time that you wait before being seen by our physicians.       _____________________________________________________________  Should you have questions after your visit to [facility], please contact our office at [phone] between the hours of 8:30 a.m. and 4:30 p.m.  Voicemails left after 4:30 p.m. will not be returned until the following business day.  For prescription refill requests, have your pharmacy contact our office.       Resources For Cancer Patients and their Caregivers ? American Cancer Society: Can assist with transportation, wigs, general needs,  runs Look Good Feel Better.        408-687-5440 ? Cancer Care: Provides financial assistance, online support groups, medication/co-pay assistance.  1-800-813-HOPE 316-229-0849) ? [place] Assists [place] cancer patients and their families through emotional , educational and financial support.  289 671 8725 ? [place] DSS Where to apply for food stamps, Medicaid and utility assistance. 873-372-0855 ? [facility]: Transportation to medical appointments. (949)434-6046 ? Social Security Administration: May apply for disability if have a Stage IV cancer. 607-507-7224 (506) 182-0391 ? [employer], Disability and Transit Services: Assists with nutrition, care and transit needs. [place] Support Programs: @10RELATIVEDAYS @ > Cancer Support Group  2nd Tuesday of the month 1pm-2pm, Journey Room  > Creative Journey  3rd Tuesday of the month 1130am-1pm, Journey Room  > Look Good Feel Better  1st Wednesday of the month 10am-12 noon, Journey Room (Call [place] to register [phone])
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
## 2016-05-16 ENCOUNTER — Other Ambulatory Visit: Payer: Self-pay

## 2016-05-16 LAB — ERYTHROPOIETIN: ERYTHROPOIETIN: 12 m[IU]/mL (ref 2.6–18.5)

## 2016-05-21 ENCOUNTER — Ambulatory Visit (INDEPENDENT_AMBULATORY_CARE_PROVIDER_SITE_OTHER): Payer: Self-pay

## 2016-05-21 DIAGNOSIS — Z95810 Presence of automatic (implantable) cardiac defibrillator: Secondary | ICD-10-CM

## 2016-05-21 DIAGNOSIS — I5022 Chronic systolic (congestive) heart failure: Secondary | ICD-10-CM

## 2016-05-22 ENCOUNTER — Other Ambulatory Visit (HOSPITAL_COMMUNITY): Payer: Self-pay | Admitting: Adult Health

## 2016-05-23 ENCOUNTER — Telehealth: Payer: Self-pay | Admitting: Licensed Clinical Social Worker

## 2016-05-23 NOTE — Progress Notes (Signed)
EPIC Encounter for ICM Monitoring  Patient Name: [name] is a 61 y.o. female Date: 05/23/2016 Primary Care Physican: Rosita Fire, MD Primary Cardiologist:McLean Electrophysiologist: Allred Dry Weight:unknown Bi-V Pacing: >99%         No phone number listed for patient.  Per telephone note from licensed social worker on 05/23/2016, patient has moved to area outside of [place] and will have new physicians.  No further ICM follow since patient will no longer be followed by [facility] heart care physicians or device clinic.    Thoracic impedance normal  Recommendations:  None.  Follow-up plan: No further ICM follow up.  Copy of ICM check sent to device physician.   3 month ICM trend: 05/21/2016   1 Year ICM trend:      Rosalene Billings, RN 05/23/2016 4:50 PM

## 2016-05-23 NOTE — Telephone Encounter (Signed)
Patient called this morning to let CSW know that she was moved to an [place] near [place]. Patient had previously had some challenges with her AL and contacted the [facility] where she reports they helped her to move to a better environment. Patient was in good spirits and thankful to the HF clinic for all the support. CSW provided supportive intervention and wished patient well in her new home. Patient reports she has new MD's in the area and they will reach out for needed medical records. Patient denies any other needs at this time. Raquel Sarna, [place], [place]

## 2016-05-27 ENCOUNTER — Ambulatory Visit (HOSPITAL_COMMUNITY): Payer: Medicaid Other

## 2016-05-31 ENCOUNTER — Other Ambulatory Visit (HOSPITAL_COMMUNITY): Payer: Medicaid Other

## 2016-05-31 ENCOUNTER — Ambulatory Visit (HOSPITAL_COMMUNITY): Payer: Medicaid Other

## 2016-06-04 ENCOUNTER — Telehealth: Payer: Self-pay | Admitting: Nurse Practitioner

## 2016-06-04 ENCOUNTER — Ambulatory Visit: Payer: Medicaid Other | Admitting: Nurse Practitioner

## 2016-06-04 ENCOUNTER — Encounter: Payer: Self-pay | Admitting: Nurse Practitioner

## 2016-06-04 NOTE — Telephone Encounter (Signed)
Noted  

## 2016-06-04 NOTE — Telephone Encounter (Signed)
PATIENT WAS A NO SHOW AND LETTER SENT  °

## 2016-06-13 ENCOUNTER — Other Ambulatory Visit: Payer: Medicaid Other

## 2016-06-13 NOTE — Progress Notes (Signed)
REVIEWED-NO ADDITIONAL RECOMMENDATIONS. 

## 2016-06-14 ENCOUNTER — Other Ambulatory Visit (HOSPITAL_COMMUNITY): Payer: Medicaid Other

## 2016-06-14 ENCOUNTER — Ambulatory Visit (HOSPITAL_COMMUNITY): Payer: Medicaid Other

## 2016-07-15 ENCOUNTER — Other Ambulatory Visit (HOSPITAL_COMMUNITY): Payer: Medicaid Other

## 2016-08-14 ENCOUNTER — Other Ambulatory Visit (HOSPITAL_COMMUNITY): Payer: Medicaid Other

## 2016-09-13 ENCOUNTER — Ambulatory Visit (HOSPITAL_COMMUNITY): Payer: Medicaid Other

## 2016-09-13 ENCOUNTER — Other Ambulatory Visit (HOSPITAL_COMMUNITY): Payer: Medicaid Other

## 2017-02-10 ENCOUNTER — Telehealth: Payer: Self-pay | Admitting: *Deleted

## 2017-02-10 NOTE — Telephone Encounter (Signed)
Received transfer request in [place] for release to [employer].  LMOVM for [facility]) requesting call back to ensure patient has established f/u with the [facility].  Will ensure patient approves of [place] transfer.

## 2017-02-10 NOTE — Telephone Encounter (Signed)
Patient sister confirmed that pt will be seen at [facility].

## 2017-02-13 ENCOUNTER — Encounter: Payer: Self-pay | Admitting: Gastroenterology

## 2017-04-12 IMAGING — DX DG CHEST 2V
2 series · 2 of 2 positions shown · non-contrast
Comparison: 04/09/2014

CLINICAL DATA: Evaluate defibrillator placement.

EXAM:
CHEST  2 VIEW

[chest pa]
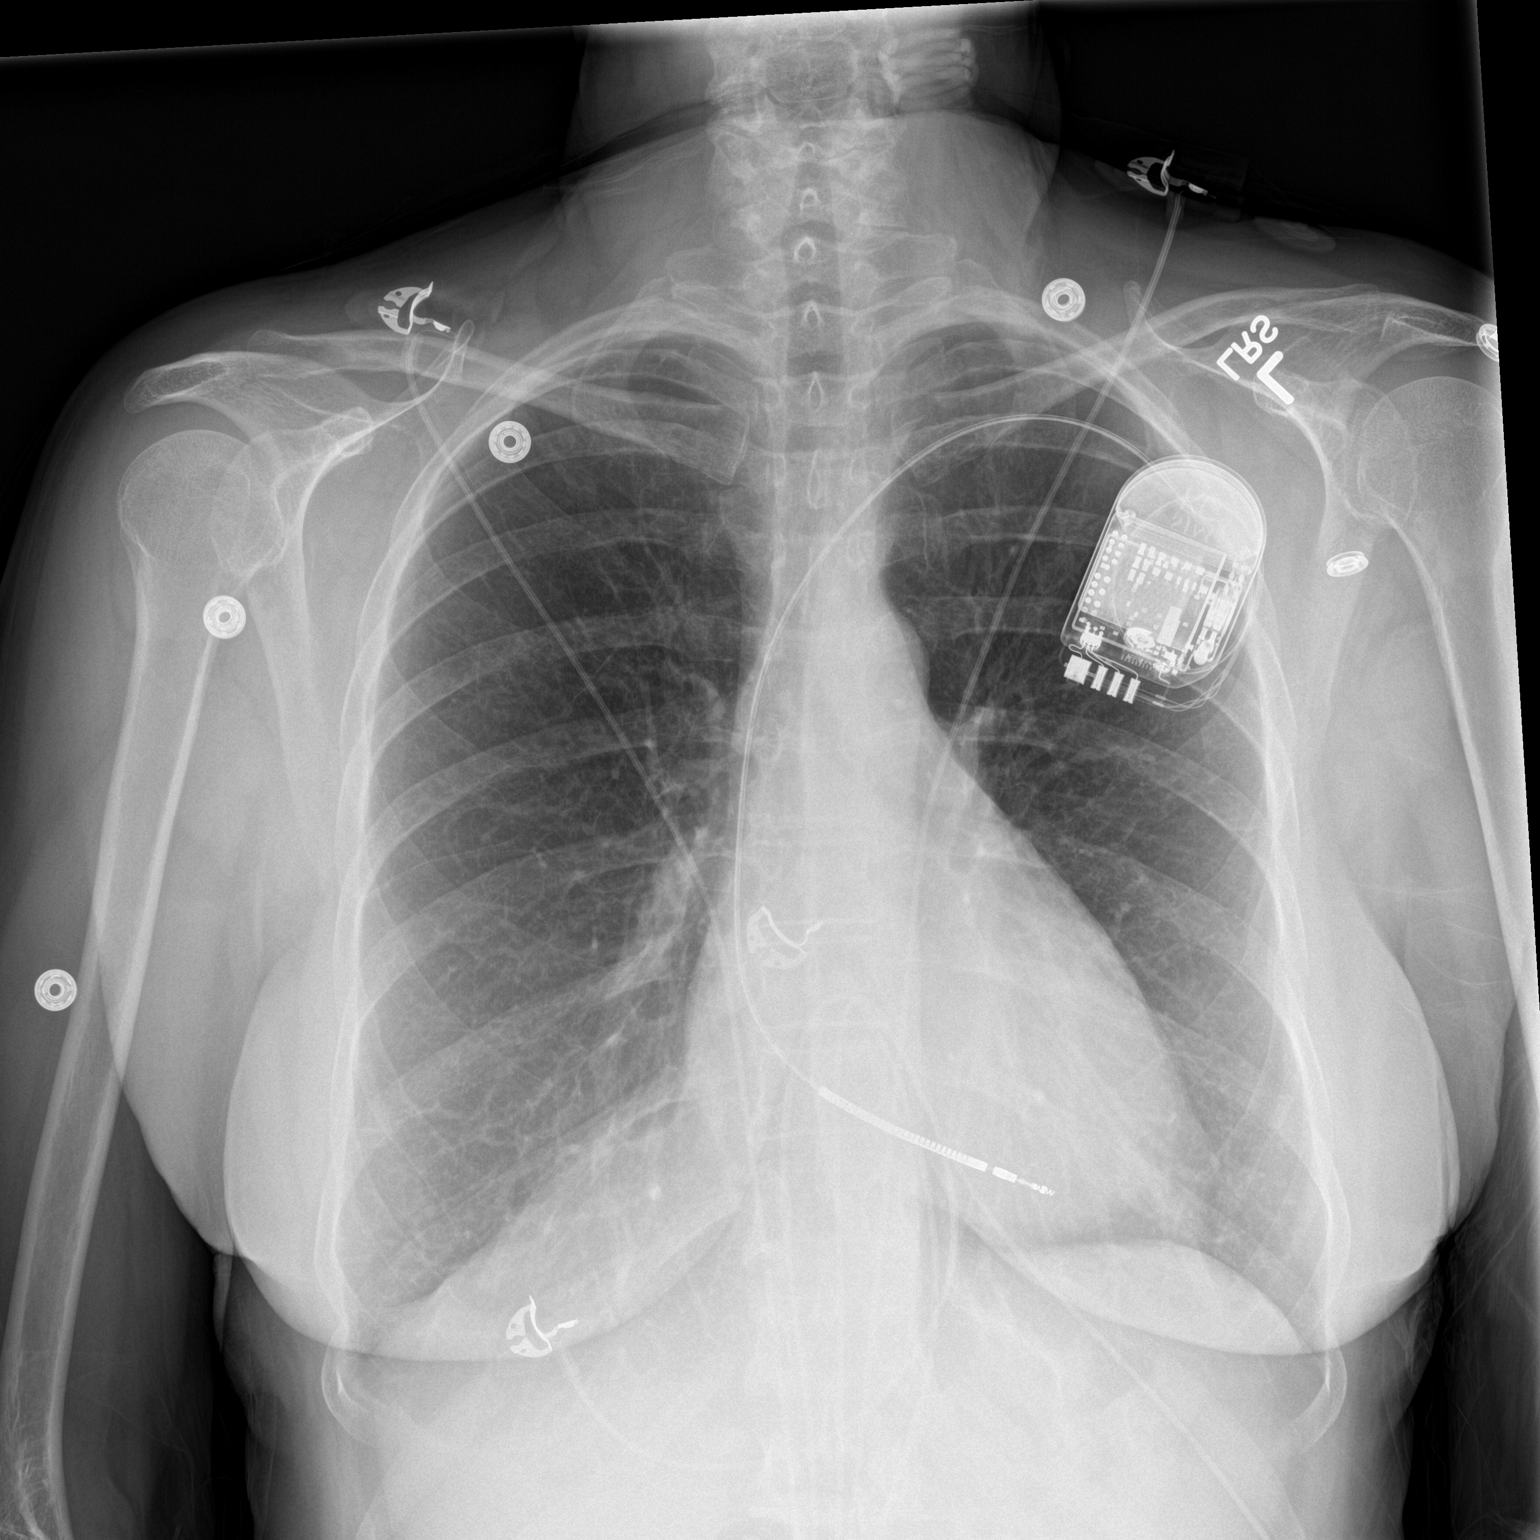

[chest lat]
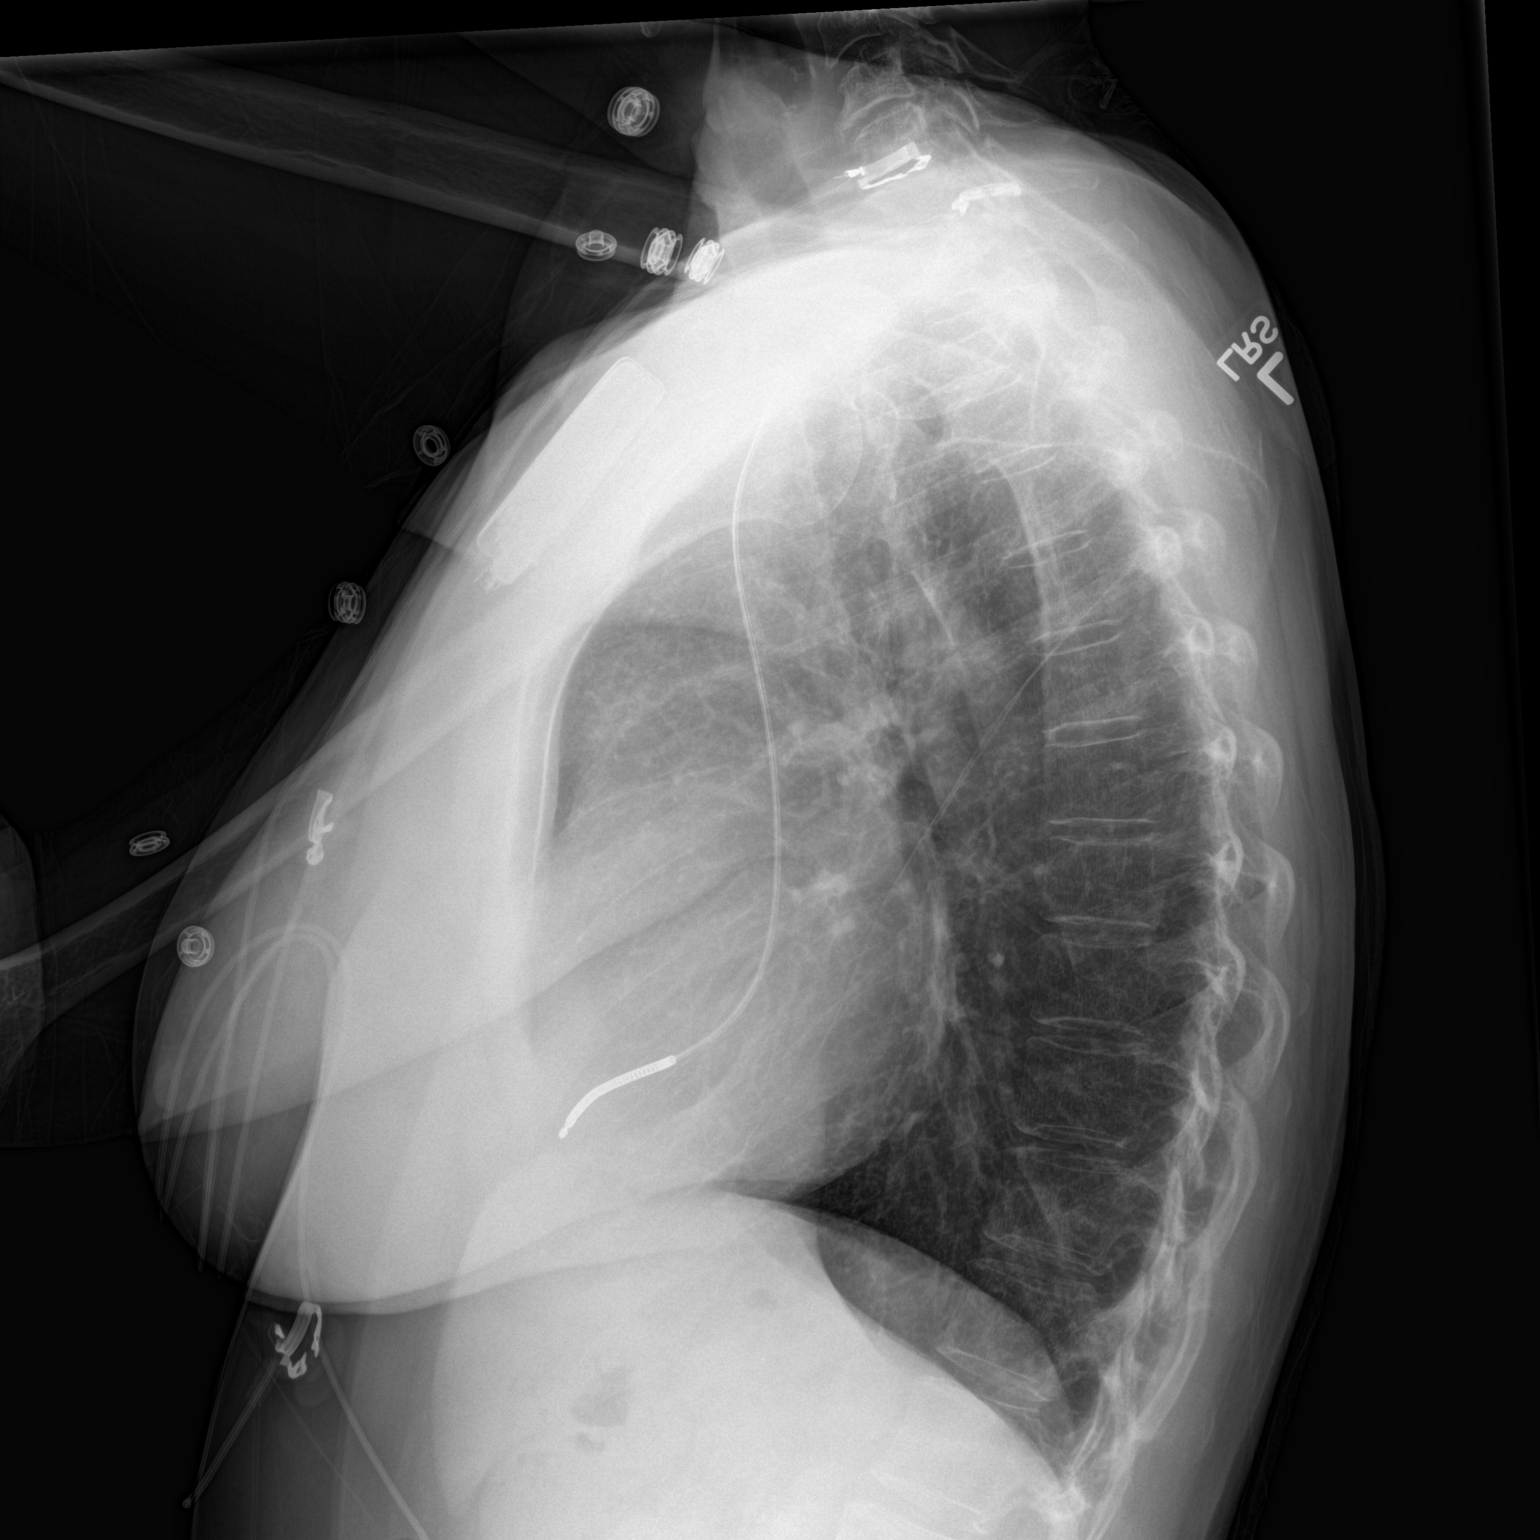

[2 of 2 positions shown; findings below may reference images not displayed]

FINDINGS: Single chamber left cardiac ICD has been placed. Lead tip in the
right ventricle. The lungs are clear without airspace disease or
pulmonary edema. Negative for a pneumothorax. Heart size is normal.
Improved aeration at the lung bases compared to the prior
examination. No acute bone abnormality.
IMPRESSION: Placement of left cardiac ICD.  Negative for pneumothorax.

No focal lung disease.

## 2018-06-09 NOTE — Progress Notes (Signed)
REVIEWED-NO ADDITIONAL RECOMMENDATIONS.
